# Patient Record
Sex: Male | Born: 1935
Health system: Southern US, Community
[De-identification: ages and names within clinical notes are randomized; demographics above are authoritative.]

## PROBLEM LIST (undated history)

## (undated) ENCOUNTER — Emergency Department (HOSPITAL_BASED_OUTPATIENT_CLINIC_OR_DEPARTMENT_OTHER): Admission: EM | Payer: Medicare Other | Source: Home / Self Care

## (undated) DIAGNOSIS — D72819 Decreased white blood cell count, unspecified: Secondary | ICD-10-CM

## (undated) DIAGNOSIS — N289 Disorder of kidney and ureter, unspecified: Secondary | ICD-10-CM

## (undated) DIAGNOSIS — J449 Chronic obstructive pulmonary disease, unspecified: Secondary | ICD-10-CM

## (undated) DIAGNOSIS — N184 Chronic kidney disease, stage 4 (severe): Secondary | ICD-10-CM

## (undated) DIAGNOSIS — D649 Anemia, unspecified: Secondary | ICD-10-CM

## (undated) DIAGNOSIS — I714 Abdominal aortic aneurysm, without rupture: Secondary | ICD-10-CM

## (undated) DIAGNOSIS — K274 Chronic or unspecified peptic ulcer, site unspecified, with hemorrhage: Secondary | ICD-10-CM

## (undated) DIAGNOSIS — M4800 Spinal stenosis, site unspecified: Secondary | ICD-10-CM

## (undated) DIAGNOSIS — IMO0002 Reserved for concepts with insufficient information to code with codable children: Secondary | ICD-10-CM

## (undated) DIAGNOSIS — I10 Essential (primary) hypertension: Secondary | ICD-10-CM

## (undated) DIAGNOSIS — F0390 Unspecified dementia without behavioral disturbance: Secondary | ICD-10-CM

## (undated) DIAGNOSIS — Q600 Renal agenesis, unilateral: Secondary | ICD-10-CM

## (undated) DIAGNOSIS — K254 Chronic or unspecified gastric ulcer with hemorrhage: Secondary | ICD-10-CM

## (undated) DIAGNOSIS — J189 Pneumonia, unspecified organism: Secondary | ICD-10-CM

## (undated) DIAGNOSIS — M1990 Unspecified osteoarthritis, unspecified site: Secondary | ICD-10-CM

## (undated) DIAGNOSIS — I4891 Unspecified atrial fibrillation: Secondary | ICD-10-CM

## (undated) DIAGNOSIS — Z8639 Personal history of other endocrine, nutritional and metabolic disease: Secondary | ICD-10-CM

## (undated) HISTORY — PX: CATARACT EXTRACTION W/ INTRAOCULAR LENS  IMPLANT, BILATERAL: SHX1307

## (undated) HISTORY — DX: Spinal stenosis, site unspecified: M48.00

## (undated) HISTORY — PX: MOHS SURGERY: SHX181

## (undated) HISTORY — DX: Chronic or unspecified peptic ulcer, site unspecified, with hemorrhage: K27.4

## (undated) HISTORY — DX: Essential (primary) hypertension: I10

## (undated) HISTORY — DX: Chronic obstructive pulmonary disease, unspecified: J44.9

## (undated) HISTORY — DX: Decreased white blood cell count, unspecified: D72.819

## (undated) HISTORY — DX: Disorder of kidney and ureter, unspecified: N28.9

## (undated) HISTORY — DX: Abdominal aortic aneurysm, without rupture: I71.4

## (undated) HISTORY — DX: Chronic kidney disease, stage 4 (severe): N18.4

## (undated) HISTORY — DX: Personal history of other endocrine, nutritional and metabolic disease: Z86.39

## (undated) HISTORY — DX: Anemia, unspecified: D64.9

## (undated) HISTORY — DX: Renal agenesis, unilateral: Q60.0

## (undated) HISTORY — DX: Reserved for concepts with insufficient information to code with codable children: IMO0002

---

## 1988-12-27 HISTORY — PX: HEMORRHOID SURGERY: SHX153

## 1995-12-28 DIAGNOSIS — K284 Chronic or unspecified gastrojejunal ulcer with hemorrhage: Secondary | ICD-10-CM

## 1995-12-28 DIAGNOSIS — K274 Chronic or unspecified peptic ulcer, site unspecified, with hemorrhage: Secondary | ICD-10-CM

## 1995-12-28 HISTORY — DX: Chronic or unspecified gastrojejunal ulcer with hemorrhage: K28.4

## 1995-12-28 HISTORY — DX: Chronic or unspecified peptic ulcer, site unspecified, with hemorrhage: K27.4

## 2006-12-14 ENCOUNTER — Ambulatory Visit: Payer: Self-pay | Admitting: Internal Medicine

## 2007-03-01 ENCOUNTER — Ambulatory Visit: Payer: Self-pay | Admitting: Internal Medicine

## 2007-03-01 LAB — CONVERTED CEMR LAB
Chloride: 109 meq/L (ref 96–112)
Eosinophils Absolute: 0.1 10*3/uL (ref 0.0–0.6)
Eosinophils Relative: 1.7 % (ref 0.0–5.0)
GFR calc non Af Amer: 77 mL/min
Glucose, Bld: 102 mg/dL — ABNORMAL HIGH (ref 70–99)
HCT: 41 % (ref 39.0–52.0)
Hemoglobin: 14.1 g/dL (ref 13.0–17.0)
Lymphocytes Relative: 26.2 % (ref 12.0–46.0)
MCV: 93.4 fL (ref 78.0–100.0)
Monocytes Absolute: 0.3 10*3/uL (ref 0.2–0.7)
Neutro Abs: 2.3 10*3/uL (ref 1.4–7.7)
Neutrophils Relative %: 62.1 % (ref 43.0–77.0)
RBC: 4.39 M/uL (ref 4.22–5.81)
Sodium: 143 meq/L (ref 135–145)
WBC: 3.6 10*3/uL — ABNORMAL LOW (ref 4.5–10.5)

## 2007-03-21 ENCOUNTER — Ambulatory Visit: Payer: Self-pay | Admitting: Gastroenterology

## 2007-04-12 ENCOUNTER — Ambulatory Visit: Payer: Self-pay | Admitting: Gastroenterology

## 2007-04-12 DIAGNOSIS — K5730 Diverticulosis of large intestine without perforation or abscess without bleeding: Secondary | ICD-10-CM | POA: Insufficient documentation

## 2007-05-10 ENCOUNTER — Ambulatory Visit: Payer: Self-pay | Admitting: Gastroenterology

## 2007-05-25 ENCOUNTER — Encounter: Payer: Self-pay | Admitting: Internal Medicine

## 2007-05-25 ENCOUNTER — Encounter: Payer: Self-pay | Admitting: Gastroenterology

## 2007-05-25 ENCOUNTER — Ambulatory Visit (HOSPITAL_COMMUNITY): Admission: RE | Admit: 2007-05-25 | Discharge: 2007-05-25 | Payer: Self-pay | Admitting: Gastroenterology

## 2007-05-30 ENCOUNTER — Ambulatory Visit: Payer: Self-pay | Admitting: Gastroenterology

## 2007-07-19 ENCOUNTER — Ambulatory Visit: Payer: Self-pay | Admitting: Gastroenterology

## 2007-08-17 ENCOUNTER — Ambulatory Visit: Payer: Self-pay | Admitting: Family Medicine

## 2007-08-17 ENCOUNTER — Telehealth (INDEPENDENT_AMBULATORY_CARE_PROVIDER_SITE_OTHER): Payer: Self-pay | Admitting: *Deleted

## 2007-09-04 ENCOUNTER — Ambulatory Visit (HOSPITAL_COMMUNITY): Admission: RE | Admit: 2007-09-04 | Discharge: 2007-09-04 | Payer: Self-pay | Admitting: Gastroenterology

## 2007-10-13 ENCOUNTER — Ambulatory Visit: Payer: Self-pay | Admitting: Gastroenterology

## 2007-10-13 ENCOUNTER — Ambulatory Visit: Payer: Self-pay | Admitting: Internal Medicine

## 2007-11-29 ENCOUNTER — Ambulatory Visit: Payer: Self-pay | Admitting: Internal Medicine

## 2007-11-29 DIAGNOSIS — L719 Rosacea, unspecified: Secondary | ICD-10-CM | POA: Insufficient documentation

## 2007-11-29 DIAGNOSIS — L989 Disorder of the skin and subcutaneous tissue, unspecified: Secondary | ICD-10-CM | POA: Insufficient documentation

## 2007-12-28 HISTORY — PX: ABDOMINAL AORTIC ANEURYSM REPAIR: SUR1152

## 2007-12-28 HISTORY — PX: NEPHRECTOMY: SHX65

## 2008-03-01 DIAGNOSIS — K219 Gastro-esophageal reflux disease without esophagitis: Secondary | ICD-10-CM

## 2008-03-01 DIAGNOSIS — Z85828 Personal history of other malignant neoplasm of skin: Secondary | ICD-10-CM | POA: Insufficient documentation

## 2008-03-01 DIAGNOSIS — Z8711 Personal history of peptic ulcer disease: Secondary | ICD-10-CM | POA: Insufficient documentation

## 2008-03-01 DIAGNOSIS — E7800 Pure hypercholesterolemia, unspecified: Secondary | ICD-10-CM

## 2008-09-20 ENCOUNTER — Emergency Department (HOSPITAL_BASED_OUTPATIENT_CLINIC_OR_DEPARTMENT_OTHER): Admission: EM | Admit: 2008-09-20 | Discharge: 2008-09-20 | Payer: Self-pay | Admitting: Emergency Medicine

## 2008-09-23 ENCOUNTER — Telehealth: Payer: Self-pay | Admitting: Internal Medicine

## 2008-09-24 ENCOUNTER — Encounter: Payer: Self-pay | Admitting: Internal Medicine

## 2008-09-25 ENCOUNTER — Encounter: Admission: RE | Admit: 2008-09-25 | Discharge: 2008-09-25 | Payer: Self-pay | Admitting: Surgery

## 2008-09-26 DIAGNOSIS — I714 Abdominal aortic aneurysm, without rupture, unspecified: Secondary | ICD-10-CM

## 2008-09-26 HISTORY — DX: Abdominal aortic aneurysm, without rupture, unspecified: I71.40

## 2008-09-26 HISTORY — DX: Abdominal aortic aneurysm, without rupture: I71.4

## 2008-09-30 ENCOUNTER — Ambulatory Visit: Payer: Self-pay | Admitting: Surgery

## 2008-10-04 ENCOUNTER — Inpatient Hospital Stay (HOSPITAL_COMMUNITY): Admission: RE | Admit: 2008-10-04 | Discharge: 2008-10-05 | Payer: Self-pay | Admitting: Surgery

## 2008-10-04 ENCOUNTER — Ambulatory Visit: Payer: Self-pay | Admitting: Surgery

## 2008-10-09 ENCOUNTER — Ambulatory Visit: Payer: Self-pay | Admitting: Internal Medicine

## 2008-10-09 ENCOUNTER — Ambulatory Visit: Payer: Self-pay | Admitting: Vascular Surgery

## 2008-10-14 ENCOUNTER — Encounter: Admission: RE | Admit: 2008-10-14 | Discharge: 2008-10-14 | Payer: Self-pay | Admitting: Surgery

## 2008-10-24 ENCOUNTER — Encounter (INDEPENDENT_AMBULATORY_CARE_PROVIDER_SITE_OTHER): Payer: Self-pay | Admitting: Urology

## 2008-10-24 ENCOUNTER — Inpatient Hospital Stay (HOSPITAL_COMMUNITY): Admission: RE | Admit: 2008-10-24 | Discharge: 2008-10-28 | Payer: Self-pay | Admitting: Urology

## 2008-12-23 ENCOUNTER — Ambulatory Visit: Payer: Self-pay | Admitting: Surgery

## 2008-12-25 ENCOUNTER — Ambulatory Visit: Payer: Self-pay | Admitting: Internal Medicine

## 2008-12-25 DIAGNOSIS — I10 Essential (primary) hypertension: Secondary | ICD-10-CM

## 2008-12-25 DIAGNOSIS — N189 Chronic kidney disease, unspecified: Secondary | ICD-10-CM

## 2008-12-25 DIAGNOSIS — C649 Malignant neoplasm of unspecified kidney, except renal pelvis: Secondary | ICD-10-CM | POA: Insufficient documentation

## 2008-12-25 DIAGNOSIS — I714 Abdominal aortic aneurysm, without rupture: Secondary | ICD-10-CM

## 2008-12-30 ENCOUNTER — Telehealth (INDEPENDENT_AMBULATORY_CARE_PROVIDER_SITE_OTHER): Payer: Self-pay | Admitting: *Deleted

## 2008-12-30 LAB — CONVERTED CEMR LAB
BUN: 22 mg/dL (ref 6–23)
Basophils Relative: 0.3 % (ref 0.0–3.0)
Creatinine, Ser: 1.9 mg/dL — ABNORMAL HIGH (ref 0.4–1.5)
Eosinophils Absolute: 0.2 10*3/uL (ref 0.0–0.7)
Eosinophils Relative: 5.3 % — ABNORMAL HIGH (ref 0.0–5.0)
GFR calc Af Amer: 44 mL/min
GFR calc non Af Amer: 37 mL/min
HCT: 34.5 % — ABNORMAL LOW (ref 39.0–52.0)
MCV: 93.8 fL (ref 78.0–100.0)
Monocytes Absolute: 0.4 10*3/uL (ref 0.1–1.0)
Platelets: 104 10*3/uL — ABNORMAL LOW (ref 150–400)
RBC: 3.68 M/uL — ABNORMAL LOW (ref 4.22–5.81)
WBC: 3.5 10*3/uL — ABNORMAL LOW (ref 4.5–10.5)

## 2008-12-31 ENCOUNTER — Encounter (INDEPENDENT_AMBULATORY_CARE_PROVIDER_SITE_OTHER): Payer: Self-pay | Admitting: *Deleted

## 2009-01-31 ENCOUNTER — Encounter: Payer: Self-pay | Admitting: Internal Medicine

## 2009-02-07 ENCOUNTER — Ambulatory Visit: Payer: Self-pay | Admitting: Internal Medicine

## 2009-02-10 ENCOUNTER — Encounter: Admission: RE | Admit: 2009-02-10 | Discharge: 2009-02-10 | Payer: Self-pay | Admitting: Surgery

## 2009-02-10 ENCOUNTER — Ambulatory Visit: Payer: Self-pay | Admitting: Surgery

## 2009-03-07 ENCOUNTER — Ambulatory Visit: Payer: Self-pay | Admitting: Family Medicine

## 2009-03-18 ENCOUNTER — Ambulatory Visit: Payer: Self-pay | Admitting: Family Medicine

## 2009-04-04 ENCOUNTER — Encounter: Admission: RE | Admit: 2009-04-04 | Discharge: 2009-04-04 | Payer: Self-pay | Admitting: Urology

## 2009-05-05 ENCOUNTER — Ambulatory Visit: Payer: Self-pay | Admitting: Surgery

## 2009-06-02 ENCOUNTER — Ambulatory Visit: Payer: Self-pay | Admitting: Family Medicine

## 2009-06-02 ENCOUNTER — Telehealth (INDEPENDENT_AMBULATORY_CARE_PROVIDER_SITE_OTHER): Payer: Self-pay | Admitting: *Deleted

## 2009-06-06 ENCOUNTER — Ambulatory Visit: Payer: Self-pay | Admitting: Internal Medicine

## 2009-06-06 DIAGNOSIS — J449 Chronic obstructive pulmonary disease, unspecified: Secondary | ICD-10-CM | POA: Insufficient documentation

## 2009-06-06 DIAGNOSIS — D649 Anemia, unspecified: Secondary | ICD-10-CM | POA: Insufficient documentation

## 2009-06-09 ENCOUNTER — Ambulatory Visit: Payer: Self-pay | Admitting: Internal Medicine

## 2009-06-12 ENCOUNTER — Telehealth (INDEPENDENT_AMBULATORY_CARE_PROVIDER_SITE_OTHER): Payer: Self-pay | Admitting: *Deleted

## 2009-06-12 LAB — CONVERTED CEMR LAB
BUN: 39 mg/dL — ABNORMAL HIGH (ref 6–23)
Basophils Absolute: 0 10*3/uL (ref 0.0–0.1)
Chloride: 111 meq/L (ref 96–112)
Cholesterol: 191 mg/dL (ref 0–200)
Creatinine, Ser: 2.3 mg/dL — ABNORMAL HIGH (ref 0.4–1.5)
Eosinophils Absolute: 0 10*3/uL (ref 0.0–0.7)
Eosinophils Relative: 0.8 % (ref 0.0–5.0)
Ferritin: 90.2 ng/mL (ref 22.0–322.0)
GFR calc non Af Amer: 29.21 mL/min (ref >60)
Glucose, Bld: 93 mg/dL (ref 70–99)
HCT: 36.3 % — ABNORMAL LOW (ref 39.0–52.0)
Iron: 66 ug/dL (ref 42–165)
LDL Cholesterol: 130 mg/dL — ABNORMAL HIGH (ref 0–99)
Lymphs Abs: 1.1 10*3/uL (ref 0.7–4.0)
MCV: 91.6 fL (ref 78.0–100.0)
Monocytes Absolute: 0.4 10*3/uL (ref 0.1–1.0)
Neutrophils Relative %: 62.9 % (ref 43.0–77.0)
Platelets: 127 10*3/uL — ABNORMAL LOW (ref 150.0–400.0)
Potassium: 4.7 meq/L (ref 3.5–5.1)
RDW: 12.8 % (ref 11.5–14.6)
Triglycerides: 83 mg/dL (ref 0.0–149.0)
VLDL: 16.6 mg/dL (ref 0.0–40.0)
WBC: 4.3 10*3/uL — ABNORMAL LOW (ref 4.5–10.5)

## 2009-07-08 ENCOUNTER — Encounter: Payer: Self-pay | Admitting: Internal Medicine

## 2009-07-10 ENCOUNTER — Telehealth (INDEPENDENT_AMBULATORY_CARE_PROVIDER_SITE_OTHER): Payer: Self-pay | Admitting: *Deleted

## 2009-07-18 ENCOUNTER — Ambulatory Visit (HOSPITAL_COMMUNITY): Admission: RE | Admit: 2009-07-18 | Discharge: 2009-07-18 | Payer: Self-pay | Admitting: Internal Medicine

## 2009-07-22 ENCOUNTER — Encounter: Payer: Self-pay | Admitting: Internal Medicine

## 2009-08-08 ENCOUNTER — Encounter: Payer: Self-pay | Admitting: Internal Medicine

## 2009-10-07 ENCOUNTER — Ambulatory Visit: Payer: Self-pay | Admitting: Internal Medicine

## 2009-10-16 ENCOUNTER — Encounter: Admission: RE | Admit: 2009-10-16 | Discharge: 2009-10-16 | Payer: Self-pay | Admitting: Urology

## 2009-11-03 ENCOUNTER — Ambulatory Visit: Payer: Self-pay | Admitting: Surgery

## 2010-01-23 ENCOUNTER — Ambulatory Visit (HOSPITAL_COMMUNITY): Admission: RE | Admit: 2010-01-23 | Discharge: 2010-01-23 | Payer: Self-pay | Admitting: Internal Medicine

## 2010-01-27 ENCOUNTER — Encounter: Payer: Self-pay | Admitting: Internal Medicine

## 2010-01-27 DIAGNOSIS — D72819 Decreased white blood cell count, unspecified: Secondary | ICD-10-CM

## 2010-01-27 HISTORY — DX: Decreased white blood cell count, unspecified: D72.819

## 2010-02-20 ENCOUNTER — Encounter: Payer: Self-pay | Admitting: Internal Medicine

## 2010-02-23 ENCOUNTER — Encounter: Payer: Self-pay | Admitting: Internal Medicine

## 2010-02-23 LAB — CONVERTED CEMR LAB: Platelets: 111 10*3/uL

## 2010-03-23 ENCOUNTER — Encounter: Payer: Self-pay | Admitting: Internal Medicine

## 2010-04-07 ENCOUNTER — Ambulatory Visit: Payer: Self-pay | Admitting: Internal Medicine

## 2010-04-09 ENCOUNTER — Encounter: Admission: RE | Admit: 2010-04-09 | Discharge: 2010-04-09 | Payer: Self-pay | Admitting: Internal Medicine

## 2010-04-09 ENCOUNTER — Encounter: Payer: Self-pay | Admitting: Internal Medicine

## 2010-04-09 ENCOUNTER — Encounter: Admission: RE | Admit: 2010-04-09 | Discharge: 2010-04-09 | Payer: Self-pay | Admitting: Urology

## 2010-04-10 LAB — CONVERTED CEMR LAB
Cholesterol: 223 mg/dL — ABNORMAL HIGH (ref 0–200)
Direct LDL: 158.3 mg/dL
HDL: 45 mg/dL (ref >39.00)
TSH: 2.61 microintl units/mL (ref 0.35–5.50)
Triglycerides: 125 mg/dL (ref 0.0–149.0)
VLDL: 25 mg/dL (ref 0.0–40.0)

## 2010-06-01 ENCOUNTER — Ambulatory Visit: Payer: Self-pay | Admitting: Surgery

## 2010-06-22 ENCOUNTER — Encounter: Payer: Self-pay | Admitting: Internal Medicine

## 2010-07-20 ENCOUNTER — Encounter: Payer: Self-pay | Admitting: Internal Medicine

## 2010-08-04 ENCOUNTER — Ambulatory Visit (HOSPITAL_COMMUNITY)
Admission: RE | Admit: 2010-08-04 | Discharge: 2010-08-04 | Payer: Self-pay | Source: Home / Self Care | Admitting: Internal Medicine

## 2010-10-01 ENCOUNTER — Ambulatory Visit: Payer: Self-pay | Admitting: Internal Medicine

## 2010-10-07 ENCOUNTER — Encounter: Admission: RE | Admit: 2010-10-07 | Discharge: 2010-10-07 | Payer: Self-pay | Admitting: Urology

## 2010-10-14 ENCOUNTER — Ambulatory Visit: Payer: Self-pay | Admitting: Internal Medicine

## 2010-10-19 ENCOUNTER — Encounter: Payer: Self-pay | Admitting: Internal Medicine

## 2010-12-07 ENCOUNTER — Ambulatory Visit: Payer: Self-pay | Admitting: Surgery

## 2010-12-07 ENCOUNTER — Encounter
Admission: RE | Admit: 2010-12-07 | Discharge: 2010-12-07 | Payer: Self-pay | Source: Home / Self Care | Attending: Surgery | Admitting: Surgery

## 2010-12-07 ENCOUNTER — Encounter: Payer: Self-pay | Admitting: Internal Medicine

## 2010-12-14 ENCOUNTER — Ambulatory Visit: Payer: Self-pay | Admitting: Internal Medicine

## 2010-12-14 DIAGNOSIS — M1990 Unspecified osteoarthritis, unspecified site: Secondary | ICD-10-CM

## 2011-01-17 ENCOUNTER — Encounter: Payer: Self-pay | Admitting: Surgery

## 2011-01-26 NOTE — Letter (Signed)
Summary: [facility]  [facility]   Imported By: Lanelle Bal 03/05/2010 12:32:52  _____________________________________________________________________  External Attachment:    Type:   Image     Comment:   External Document

## 2011-01-26 NOTE — Consult Note (Signed)
Summary: no change for now---- Nephrology  [facility]   Imported By: Lanelle Bal 07/31/2010 10:20:47  _____________________________________________________________________  External Attachment:    Type:   Image     Comment:   External Document

## 2011-01-26 NOTE — Assessment & Plan Note (Signed)
Summary: FLU SHOT//PH   Nurse Visit  CC: Flu shot./kb   Allergies: No Known Drug Allergies  Orders Added: 1)  Flu Vaccine 59yrs + MEDICARE PATIENTS [Q2039] 2)  Administration Flu vaccine - MCR [G0008]              Flu Vaccine Consent Questions     Do you have a history of severe allergic reactions to this vaccine? no    Any prior history of allergic reactions to egg and/or gelatin? no    Do you have a sensitivity to the preservative Thimersol? no    Do you have a past history of Guillan-Barre Syndrome? no    Do you currently have an acute febrile illness? no    Have you ever had a severe reaction to latex? no    Vaccine information given and explained to patient? yes    Are you currently pregnant? no    Lot Number:[id number]   Exp Date:06/26/2011   Site Given  Right Deltoid IMu

## 2011-01-26 NOTE — Letter (Signed)
Summary: [facility] Oncology  [facility]ematology Oncology   Imported By: Lanelle Bal 07/01/2010 15:25:19  _____________________________________________________________________  External Attachment:    Type:   Image     Comment:   External Document

## 2011-01-26 NOTE — Consult Note (Signed)
Summary: [facility]  [facility]   Imported By: Lanelle Bal 04/15/2010 13:39:32  _____________________________________________________________________  External Attachment:    Type:   Image     Comment:   External Document

## 2011-01-26 NOTE — Assessment & Plan Note (Signed)
Summary: 6 MTH FU/KDC   Vital Signs:  Patient profile:   77 year old male Height:      69.5 inches Weight:      180.38 pounds Pulse rate:   83 / minute Pulse rhythm:   regular BP sitting:   126 / 84  (left arm) Cuff size:   large  Vitals Entered By: Army Fossa CMA (October 14, 2010 7:59 AM) CC: 6 month f/u- fasting  Comments due [facility]oscopy [facility] [address]   History of Present Illness: ROV doing well , here w/  Raynelle Fanning needs refills Has seen his other doctors as usual.    Preventive Screening-Counseling & Management  Caffeine-Diet-Exercise     Does Patient Exercise: yes     Type of exercise: walking 2 miles      Times/week: 7  Current Medications (verified): 1)  Mvi W/ Iron 2)  Felodipine 2.5 Mg Xr24h-Tab (Felodipine) .Marland Kitchen.. 1 By Mouth Once Daily 3)  Stool Softner 4)  Omeprazole 20 Mg Cpdr (Omeprazole) .... One By Mouth Daily 5)  Vitamin D 1.25mg  .... 1 By Mouth Every Month (Rx'd By Nephro) 6)  Torsemide 10 Mg Tabs (Torsemide) .Marland Kitchen.. 1 By Mouth Every Other Day (Rx'd By Nephro)  Allergies (verified): No Known Drug Allergies  Past History:  Past Medical History: Reviewed history from 04/07/2010 and no changes required. Bleeding Ulcer-1997 Abdominal aortic aneurysm Dx 10-09---> s/p endovascular repair and angiogram 10-04-08 carotid u/s <30% B (10-09) RENAL CELL CARCINOMA, CLEAR CELL TYPE   Dx 10-09, s/p excision 10-24-08 Hypertension Renal insufficiency COPD? history of leukopenia/osteopenia thought to be from doxycycline (2-11)  Past Surgical History: Reviewed history from 03/01/2008 and no changes required. 1997- Lip Cancer Removed 2004-Skin Cancer Removed Hemorrhoidectomy (4540'J)  Social History: Reviewed history from 04/07/2010 and no changes required. Married lives w/ wife totally independent on ADL still drives  treadmill 2 miles /day often came to the office w/ his daughter Alwyn Ren  [phone]) quit tobacco 2009 Alcohol use-no Does Patient  Exercise:  yes  Review of Systems       He continued to live independently and able to do all his ADLs Hypertension-- ambulatory BPs 120/80 Renal insufficiency this is stable per last nephrology visit PSA was a slightly elevated, he saw urology, no action needed Wonders if he is due for a colonoscopy, chart reviewed, next colonoscopy 2018 still very active and walks in the treadmill   Physical Exam  General:  alert and well-developed.   Lungs:  normal respiratory effort, no intercostal retractions, and no accessory muscle use.  slightly decreased breath sounds Heart:  normal rate, regular rhythm, no murmur, and no gallop.   Extremities:  no edema Psych:  normally interactive, good eye contact, not anxious appearing, and not depressed appearing.     Impression & Recommendations:  Problem # 1:  RENAL INSUFFICIENCY (ICD-588.9) stable  Problem # 2:  HYPERTENSION (ICD-401.9) medication refilled His updated medication list for this problem includes:    Felodipine 2.5 Mg Xr24h-tab (Felodipine) .Marland Kitchen... 1 by mouth once daily    Torsemide 10 Mg Tabs (Torsemide) .Marland Kitchen... 1 by mouth every other day (rx'd by nephro)  BP today: 126/84 Prior BP: 128/64 (04/07/2010)  Labs Reviewed: K+: 4.7 (06/09/2009) Creat: : 2.3 (06/09/2009)   Chol: 223 (04/07/2010)   HDL: 45.00 (04/07/2010)   LDL: 130 (06/09/2009)   TG: 125.0 (04/07/2010)  Problem # 3:  GERD (ICD-530.81) medication refilled His updated medication list for this problem includes:    Omeprazole 20  Mg Cpdr (Omeprazole) ..... One by mouth daily    Complete Medication List: 1)  Mvi W/ Iron  2)  Felodipine 2.5 Mg Xr24h-tab (Felodipine) .Marland Kitchen.. 1 by mouth once daily 3)  Stool Softner  4)  Omeprazole 20 Mg Cpdr (Omeprazole) .... One by mouth daily 5)  Vitamin D 1.25mg   .... 1 by mouth every month (rx'd by nephro) 6)  Torsemide 10 Mg Tabs (Torsemide) .Marland Kitchen.. 1 by mouth every other day (rx'd by nephro)  Patient Instructions: 1)  Please schedule a  follow-up appointment in 6 months , fasting, yearly  Prescriptions: OMEPRAZOLE 20 MG CPDR (OMEPRAZOLE) one by mouth daily  #30 Capsule x 12   Entered and Authorized by:   Nolon Rod. Laneta Guerin MD   Signed by:   Nolon Rod. Avant Printy MD on 10/14/2010   Method used:   [facility] to        [facility] [phone]* (retail)       [address]       [place], [place]  [address]       Ph: [phone]       Fax: [phone]   RxID:   [id number] FELODIPINE 2.5 MG XR24H-TAB (FELODIPINE) 1 by mouth once daily  #30 x 12   Entered and Authorized by:   Nolon Rod. Brezlyn Manrique MD   Signed by:   Nolon Rod. Baneen Wieseler MD on 10/14/2010   Method used:   [facility] to        [facility] [phone]* (retail)       [address]       [place], [place]  [address]       Ph: [phone]       Fax: [phone]   RxID:   [id number]    Orders Added: 1)  Est. Patient Level III [20254]     Risk Factors:  Exercise:  yes    Times per week:  7    Type:  walking 2 miles

## 2011-01-26 NOTE — Procedures (Signed)
Summary: [facility] -- anemia of chronic dz   [facility]ematology Oncology   Imported By: Lanelle Bal 03/27/2010 10:21:33  _____________________________________________________________________  External Attachment:    Type:   Image     Comment:   External Document

## 2011-01-26 NOTE — Letter (Signed)
Summary: [facility]  [facility]   Imported By: Lanelle Bal 04/01/2010 12:24:01  _____________________________________________________________________  External Attachment:    Type:   Image     Comment:   External Document

## 2011-01-26 NOTE — Consult Note (Signed)
Summary: C[facility] stable------------[facility]  [facility]   Imported By: Lanelle Bal 10/29/2010 15:56:43  _____________________________________________________________________  External Attachment:    Type:   Image     Comment:   External Document

## 2011-01-26 NOTE — Letter (Signed)
Summary: [facility] Hematology Oncology  [facility] Hematology Oncology   Imported By: Lanelle Bal 03/02/2010 10:01:28  _____________________________________________________________________  External Attachment:    Type:   Image     Comment:   External Document

## 2011-01-26 NOTE — Letter (Signed)
Summary:   Nephrology  [facility]   Imported By: Lanelle Bal 02/12/2010 09:29:43  _____________________________________________________________________  External Attachment:    Type:   Image     Comment:   External Document

## 2011-01-26 NOTE — Assessment & Plan Note (Signed)
Summary: GENERAL CHECKUP/NS/KDC   Vital Signs:  Patient profile:   77 year old male Height:      69.5 inches Weight:      187.4 pounds BMI:     27.38 Pulse rate:   76 / minute BP sitting:   128 / 64  Vitals Entered By: Shary Decamp (April 07, 2010 10:29 AM) CC: yearly - fasting, no concerns   History of Present Illness: here with her daughter and wife  Abdominal aortic aneurysm Dx 10-09---> follow-up by   surgery  RENAL CELL CARCINOMA--note from urology 01/1999 and then reviewed, stable, follow-up in 6 months  Hypertension-- ambulatory BPs 120s  Renal insufficiency--the note from nephrology reviewed, stable; patient would like a referral to a nutritionist  COPD? occasionally wheezing   yearly checkup, chart reviewed 05/2007 EGD showed achalasia 05/2007 colonoscopy normal, Next in  10 years  Current Medications (verified): 1)  Mvi W/ Iron 2)  Felodipine 2.5 Mg Xr24h-Tab (Felodipine) .Marland Kitchen.. 1 By Mouth Once Daily 3)  Stool Softner 4)  Omeprazole 20 Mg Cpdr (Omeprazole) .... One By Mouth Daily 5)  Vitamin D 1.25mg  .... 1 By Mouth Every Month (Rx'd By Nephro) 6)  Torsemide 10 Mg Tabs (Torsemide) .Marland Kitchen.. 1 By Mouth Every Other Day (Rx'd By Nephro)  Allergies (verified): No Known Drug Allergies  Past History:  Past Medical History: Bleeding Ulcer-1997 Abdominal aortic aneurysm Dx 10-09---> s/p endovascular repair and angiogram 10-04-08 carotid u/s <30% B (10-09) RENAL CELL CARCINOMA, CLEAR CELL TYPE   Dx 10-09, s/p excision 10-24-08 Hypertension Renal insufficiency COPD? history of leukopenia/osteopenia thought to be from doxycycline (2-11)  Past Surgical History: Reviewed history from 03/01/2008 and no changes required. 1997- Lip Cancer Removed 2004-Skin Cancer Removed Hemorrhoidectomy (4401'U)  Family History: DM-- F? Stroke-- + HTN-- + colon ca-- no prostate ca--no  Social History: Married lives w/ wife totally independent on ADL still drives  treadmill 2  miles /day often came to the office w/ his daughter Alwyn Ren  [phone]) quit tobacco 2009 Alcohol use-no  Review of Systems CV:  Denies chest pain or discomfort and swelling of feet. Resp:  Denies cough and shortness of breath. GI:  Denies bloody stools, nausea, and vomiting.  Physical Exam  General:  alert and well-developed.   Lungs:  normal respiratory effort, no intercostal retractions, and no accessory muscle use.  slightly decreased breath sounds Heart:  normal rate, regular rhythm, no murmur, and no gallop.   Abdomen:  soft, non-tender, no distention, and no masses.   Rectal:  No external abnormalities noted. Normal sphincter tone. No rectal masses or tenderness. Prostate:  Prostate gland firm and smooth, no enlargement, nodularity, tenderness, mass, asymmetry or induration. Extremities:  no edema Psych:  not anxious appearing and not depressed appearing.     Impression & Recommendations:  Problem # 1:  AAA (ICD-441.4) followed by surgery  Orders: TLB-Lipid Panel (80061-LIPID)  Problem # 2:  ROUTINE GENERAL MEDICAL EXAM@HEALTH  CARE FACL (ICD-V70.0) Td  done today pneumonia shot  2007 printed material provided regards shingles shot  most labs done elsewhere check a PSA  colonoscopy 05/2007 negative except for tics, next in 10 years  Problem # 3:  RENAL CELL CANCER (ICD-189.0) follow-up by urology  Problem # 4:  RENAL INSUFFICIENCY (ICD-588.9)  follow-up by nephrology patient likes to talk to a nutritionist ----> referal  Orders: Nutrition Referral (Nutrition)  Problem # 5:  HYPERTENSION (ICD-401.9) well-controlled His updated medication list for this problem includes:    Felodipine 2.5  Mg Xr24h-tab (Felodipine) .Marland Kitchen... 1 by mouth once daily    Torsemide 10 Mg Tabs (Torsemide) .Marland Kitchen... 1 by mouth every other day (rx'd by nephro)    BP today: 128/64 Prior BP: 190/92 (10/07/2009)  Labs Reviewed: K+: 4.7 (06/09/2009) Creat: : 2.3 (06/09/2009)   Chol: 191  (06/09/2009)   HDL: 44.30 (06/09/2009)   LDL: 130 (06/09/2009)   TG: 83.0 (06/09/2009)  Orders: Venipuncture (95093) TLB-TSH (Thyroid Stimulating Hormone) (84443-TSH)  Problem # 6:  HYPERCHOLESTEROLEMIA (ICD-272.0) labs   HDL:44.30 (06/09/2009), 45.6 (03/01/2007)  LDL:130 (06/09/2009), DEL (03/01/2007)  Chol:191 (06/09/2009), 216 (03/01/2007)  Trig:83.0 (06/09/2009), 84 (03/01/2007)  Complete Medication List: 1)  Mvi W/ Iron  2)  Felodipine 2.5 Mg Xr24h-tab (Felodipine) .Marland Kitchen.. 1 by mouth once daily 3)  Stool Softner  4)  Omeprazole 20 Mg Cpdr (Omeprazole) .... One by mouth daily 5)  Vitamin D 1.25mg   .... 1 by mouth every month (rx'd by nephro) 6)  Torsemide 10 Mg Tabs (Torsemide) .Marland Kitchen.. 1 by mouth every other day (rx'd by nephro)  Other Orders: TLB-PSA (Prostate Specific Antigen) (84153-PSA) TD Toxoids IM 7 YR + (26712) Admin 1st Vaccine (45809) Admin 1st Vaccine [facility]) [phone])  Patient Instructions: 1)  Please schedule a follow-up appointment in 6 months .    Pneumovax Immunization History:    Pneumovax # 1:  Pneumovax (12/14/2006)  Tetanus/Td Vaccine    Vaccine Type: Td    Site: left deltoid    Mfr: Sanofi Pasteur    Dose: 0.5 ml    Route: IM    Given by: Shary Decamp    Exp. Date: 04/01/2011    Lot #: [id number]

## 2011-01-28 NOTE — Letter (Signed)
Summary: [facility] Hematology Oncology  [facility] Hematology Oncology   Imported By: Lanelle Bal 12/17/2010 11:23:43  _____________________________________________________________________  External Attachment:    Type:   Image     Comment:   External Document

## 2011-01-28 NOTE — Assessment & Plan Note (Signed)
Summary: FOR HIP PAIN//PH   Vital Signs:  Patient profile:   77 year old male Weight:      183.50 pounds Pulse rate:   71 / minute Pulse rhythm:   regular BP sitting:   132 / 82  (left arm) Cuff size:   large  Vitals Entered By: Army Fossa CMA (December 14, 2010 1:21 PM) CC: Pt here c/o "catch" in (L) hip. Comments x 1 week. [facility] [place]    History of Present Illness: one-week history of pain in the posterior aspect of the left  hip No radiation Has not been taking any medication for that pain is worse with walking, no pain at rest. Some limping  review of systems Denies any recent fall or increase in physical activity, in fact he has been walking 2 miles almost daily for a while but he discontinued this activity for the last few days. no rash in the buttocks No abdominal pain nephrology change from torsemide to furosemide.   Current Medications (verified): 1)  Mvi W/ Iron 2)  Felodipine 2.5 Mg Xr24h-Tab (Felodipine) .Marland Kitchen.. 1 By Mouth Once Daily 3)  Stool Softner 4)  Omeprazole 20 Mg Cpdr (Omeprazole) .... One By Mouth Daily 5)  Vitamin D 1.25mg  .... 1 By Mouth Every Month (Rx'd By Nephro) 6)  Furosemide 20 Mg Tabs (Furosemide) .Marland Kitchen.. 1 By Mouth Daily  Allergies (verified): No Known Drug Allergies  Past History:  Past Medical History: Reviewed history from 04/07/2010 and no changes required. Bleeding Ulcer-1997 Abdominal aortic aneurysm Dx 10-09---> s/p endovascular repair and angiogram 10-04-08 carotid u/s <30% B (10-09) RENAL CELL CARCINOMA, CLEAR CELL TYPE   Dx 10-09, s/p excision 10-24-08 Hypertension Renal insufficiency COPD? history of leukopenia/osteopenia thought to be from doxycycline (2-11)  Past Surgical History: Reviewed history from 03/01/2008 and no changes required. 1997- Lip Cancer Removed 2004-Skin Cancer Removed Hemorrhoidectomy (4098'J)  Social History: Reviewed history from 04/07/2010 and no changes required. Married lives w/  wife totally independent on ADL still drives  treadmill 2 miles /day often came to the office w/ his daughter Alwyn Ren  [phone]) quit tobacco 2009 Alcohol use-no  Physical Exam  General:  alert, well-developed, and well-nourished.   Abdomen:  soft, non-tender, no distention, and no masses.   Extremities:  normal with surgically Rotation of the right hip  normal Rotation of the left hip provoke the pain. Normal palpation of the trochanteric bursa bilaterally. Neurologic:  Lower extremity strength is normal, walks with some difficulty due to pain Psych:  not anxious appearing and not depressed appearing.     Impression & Recommendations:  Problem # 1:  DEGENERATIVE JOINT DISEASE (ICD-715.90) left hip pain likely from DJD. No history of recent trauma. The patient reports today that the pain is already some better. We talk about medication --reminded him to avoid NSAIDs  due to his chronic renal insufficiency. --He will try Tylenol, see instructions, this was discussed with the daughter. -- declined vicodin as the pain is not severe --will call if symptoms severe or persistent  Complete Medication List: 1)  Mvi W/ Iron  2)  Felodipine 2.5 Mg Xr24h-tab (Felodipine) .Marland Kitchen.. 1 by mouth once daily 3)  Stool Softner  4)  Omeprazole 20 Mg Cpdr (Omeprazole) .... One by mouth daily 5)  Vitamin D 1.25mg   .... 1 by mouth every month (rx'd by nephro) 6)  Furosemide 20 Mg Tabs (Furosemide) .Marland Kitchen.. 1 by mouth daily  Patient Instructions: 1)  Take 500 mg   tylenol  2  TABLETS every  6 hours as needed for relief of pain . Avoid taking more than 4000 mg in a 24 hour period( can cause liver damage in higher doses).    Orders Added: 1)  Est. Patient Level III [11914]

## 2011-02-15 ENCOUNTER — Encounter: Payer: Self-pay | Admitting: Internal Medicine

## 2011-02-23 ENCOUNTER — Encounter: Payer: Self-pay | Admitting: Internal Medicine

## 2011-03-03 ENCOUNTER — Encounter: Payer: Self-pay | Admitting: Internal Medicine

## 2011-03-04 NOTE — Letter (Signed)
Summary: [facility]  [facility]   Imported By: Lanelle Bal 02/24/2011 09:08:46  _____________________________________________________________________  External Attachment:    Type:   Image     Comment:   External Document

## 2011-03-08 ENCOUNTER — Encounter: Payer: Self-pay | Admitting: Internal Medicine

## 2011-03-09 NOTE — Letter (Signed)
Summary: renal CA and increased PSA f/u---- [facility]  [facility]   Imported By: Maryln Gottron 03/02/2011 14:45:11  _____________________________________________________________________  External Attachment:    Type:   Image     Comment:   External Document

## 2011-03-25 NOTE — Letter (Signed)
Summary: [facility] Hematology Oncology  [facility]ematology Oncology   Imported By: Maryln Gottron 03/15/2011 14:32:25  _____________________________________________________________________  External Attachment:    Type:   Image     Comment:   External Document

## 2011-03-29 ENCOUNTER — Other Ambulatory Visit: Payer: Self-pay | Admitting: Urology

## 2011-03-29 ENCOUNTER — Ambulatory Visit
Admission: RE | Admit: 2011-03-29 | Discharge: 2011-03-29 | Disposition: A | Discharge status: Home / Self Care | Payer: Medicare Other | Source: Ambulatory Visit | Attending: Urology | Admitting: Urology

## 2011-03-29 DIAGNOSIS — Z85528 Personal history of other malignant neoplasm of kidney: Secondary | ICD-10-CM

## 2011-04-17 ENCOUNTER — Encounter: Payer: Self-pay | Admitting: Internal Medicine

## 2011-04-19 ENCOUNTER — Ambulatory Visit (INDEPENDENT_AMBULATORY_CARE_PROVIDER_SITE_OTHER): Payer: Medicare Other | Admitting: Internal Medicine

## 2011-04-19 ENCOUNTER — Encounter: Payer: Self-pay | Admitting: Internal Medicine

## 2011-04-19 VITALS — BP 126/80 | HR 78 | Wt 178.4 lb

## 2011-04-19 DIAGNOSIS — K219 Gastro-esophageal reflux disease without esophagitis: Secondary | ICD-10-CM

## 2011-04-19 DIAGNOSIS — E7800 Pure hypercholesterolemia, unspecified: Secondary | ICD-10-CM

## 2011-04-19 DIAGNOSIS — I714 Abdominal aortic aneurysm, without rupture: Secondary | ICD-10-CM

## 2011-04-19 DIAGNOSIS — E785 Hyperlipidemia, unspecified: Secondary | ICD-10-CM

## 2011-04-19 DIAGNOSIS — I10 Essential (primary) hypertension: Secondary | ICD-10-CM

## 2011-04-19 LAB — LIPID PANEL
Cholesterol: 200 mg/dL (ref 0–200)
HDL: 46.8 mg/dL (ref >39.00)
LDL Cholesterol: 137 mg/dL — ABNORMAL HIGH (ref 0–99)
VLDL: 16 mg/dL (ref 0.0–40.0)

## 2011-04-19 MED ORDER — OMEPRAZOLE 20 MG PO CPDR: 20.0000 mg | DELAYED_RELEASE_CAPSULE | Freq: Every day | ORAL | Status: DC

## 2011-04-19 NOTE — Assessment & Plan Note (Signed)
Closely follow up by cardiovascular surgery

## 2011-04-19 NOTE — Assessment & Plan Note (Signed)
No change 

## 2011-04-19 NOTE — Assessment & Plan Note (Signed)
Needs a refill of his medication. done

## 2011-04-19 NOTE — Assessment & Plan Note (Addendum)
Last LDL 154. Recheck his cholesterol. If not at goal, consider treatment vs. Observe (even if LDL not at goal , <130) b/c he is doing very well, asymptomatic. Prescribing more medication may cause side effects and confusion. Daughter  agrees.

## 2011-04-19 NOTE — Progress Notes (Signed)
  Subjective:    Patient ID: [name], male    DOB: Mar 29, 1927, 77 y.o.   MRN: [id number]  HPI  Here with his daughter, doing very well. The daughter updated me on all his visits to specialists he had since he last saw me. He has seen hematology, urology. He also saw nephrology, creatinine slightly better, potassium slightly higher than usual. Was not recommend anything in particular.  Past Medical History  Diagnosis Date  . Abdominal aortic aneurysm 09/2008    s/o endovascular repair and angiogram 10/04/08, carotid u/s <30% B (10/09)  . Bleeding ulcer 1997  . Leukopenia 2/11    thought to be from doxy  . Osteopenia 2/11    thought to be from doxy  . Renal cell carcinoma     clear cell type dx 10/09, s/p excision 10-24-08  . Hypertension   . Renal insufficiency   . COPD (chronic obstructive pulmonary disease)        Review of Systems Doing well, no chest pain, no shortness of breath. No cough. No lower extremity edema. Still active, walks the treadmill 2 miles daily. Still  lives independently with his wife. He does some limited driving    Objective:   Physical Exam Alert, no apparent distress, alternative, please send. Lungs : CTA B Cardiovascular regular rate and rhythm. No murmur Legs without edema       Assessment & Plan:

## 2011-04-21 ENCOUNTER — Telehealth: Payer: Self-pay | Admitting: *Deleted

## 2011-04-21 NOTE — Telephone Encounter (Signed)
Left detailed message for pts daughter letting her know of the lab results, due to her being out of town on Business.

## 2011-04-21 NOTE — Telephone Encounter (Signed)
Message copied by Army Fossa on Wed Apr 21, 2011  9:24 AM ------      Message from: Willow Ora      Created: Tue Apr 20, 2011  9:09 PM       Advise patient's daughter:      Cholesterol satisfactory!

## 2011-05-11 NOTE — Assessment & Plan Note (Signed)
OFFICE VISIT   [name], [name]  DOB:  28-Aug-1927                                       06/01/2010  EAVWU#:[id number]   REASON FOR VISIT:  Follow-up aneurysm.   HISTORY:  This is an 77 year old gentleman who is status post  endovascular aneurysm repair on October 04, 2008.  Subsequently he has  undergone a right nephrectomy secondary to a right renal cell tumor by  Dr. Isabel Caprice.  He is now down to approximately 20% renal function in his  remaining kidney according to the patient.  Overall however, he feels  very healthy.  He is not having any abdominal pain.  He denies  claudication.   PHYSICAL EXAMINATION:  His heart rate is 63, blood pressure 164/83, O2  saturations are 98% on room air.  General:  He is well-appearing in no  distress.  HEENT:  Within normal limits.  Respirations are nonlabored.  Abdomen:  Soft, nontender.  No pulsatile mass.  Musculoskeletal:  He  does not have any edema or cyanosis.  Neurologically:  He is grossly  intact.  He has palpable pedal pulses.   DIAGNOSTIC STUDIES:  I have independently reviewed his CT scan which was  a noncontrast CT performed in January at [facility].  When  comparing this scan to prior, there may be a slight increase in the size  of his aneurysm.  However this may be negligible.  Again it is without  contrast so I cannot tell if there is a leak.   ASSESSMENT/PLAN:  Status post endovascular aneurysm repair.   In comparing the patient's previous 2  CT scans, there may be a slight  increase in the size of his aneurysm.  However, without contrast I  cannot assess for endoleak.  Regardless, I think the increase may be  negligible.  Certainly with the patient's renal function,  any attempt  to evaluate for endoleak would put his kidneys at high risk.  For that  reason we have discussed obtaining another CT scan in 6 months to see if  there has been any change and we will base any recommendations based  on  the 71-month CT scan which will be without contrast.  I did discuss with  the patient and his family that if the aneurysm is getting bigger and if  there is endoleak both of which we cannot definitively evaluate today,  that he would be at slightly increased risk for rupture.  He understands  that.  I will plan on seeing back in 6 months.     [name], MD  Electronically Signed   VWB/MEDQ  D:  06/01/2010  T:  06/02/2010  Job:  [id number]   cc:   Valetta Fuller, M.D.
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
## 2011-05-11 NOTE — Assessment & Plan Note (Signed)
[facility]                         GASTROENTEROLOGY OFFICE NOTE   NAME:[name], [name]                         MRN:          [id number]  DATE:07/19/2007                            DOB:          1927/09/10    GI PROBLEM LIST:  1. Recent heme-positive stool, not anemic.  Colonoscopy in April 2008,      by me was normal except for diverticulosis.  No need for routine      colorectal cancer screening for 10 years.  Colonoscopy at that      point.  2. Chronic intermittent solid food dysphagia for at least 20 years.      EGD by me in May 2008, showed a snug GE junction but this was      mucosally normal.  It was dilated to 18 mm.   INTERVAL HISTORY:  I last saw [name] six weeks ago, at the time of  his EGD.  He had a snug GE junction which was dilated to 18 mm.  He  really got no relief from his intermittent solid food dysphagia.  He  says this happens every other day or so. He does not vomit up food.  He  has not been losing weight.   PHYSICAL EXAMINATION:  Weight 185 pounds, blood pressure 136/68, pulse  72.  CONSTITUTIONAL:  Generally well-appearing.  ABDOMEN:  Soft, nontender, non-distended.  Normal bowel sounds.   ASSESSMENT/PLAN:  This 77 year old man with question of underlying  achalasia:  His wife does tell me that he eats very fast and does not  chew too well.  This can definitely be contributing to his intermittent  dysphagia symptoms.  He did have a snug GE junction, and that raises the  possibility of underlying achalasia.  I have arranged for him to have  esophageal manometry performed.  If these findings are convincing for  achalasia, I will proceed with Botox injection, to see if he gets relief  that way.     [name], MD  Electronically Signed    DPJ/MedQ  DD: 07/19/2007  DT: 07/19/2007  Job #: [id number]   cc:   Willow Ora, MD

## 2011-05-11 NOTE — Procedures (Signed)
ENDOVASCULAR STENT GRAFT EXAM   INDICATION:  Follow up AAA repair.   HISTORY:  Follow up abdominal aortic aneurysm Endograft 09/2008, right  nephrectomy.                           DUPLEX EVALUATION   AAA Sac Size:                 5.45 cm CM AP         5.75 cm CM TRV  Previous Sac Size:            02/10/2009 (CT) 5.8 cm CM AP  02/10/2009 (CT) 5.3 cm CM TRV  Evidence of an endoleak?      No                    No   Velocity Criteria:  Proximal Aorta                89 cm/sec  Proximal Stent Graft          68 cm/sec  Main Body Stent Graft-Mid     47 cm/sec  Right Limb-Proximal           103 cm/sec  Right Limb-Distal             105 cm/sec  Left Limb-Proximal            105 cm/sec  Left Limb-Distal              88 cm/sec  Patent Renal Arteries?        Yes (Left)   IMPRESSION:  1. Patent abdominal aortic aneurysm Endograft with no evidence of      endoleak.  2. Stable abdominal aortic aneurysm sac size measuring 5.45 cm x 5.75      cm.        ___________________________________________  V. Charlena Cross, MD   AS/MEDQ  D:  05/05/2009  T:  05/05/2009  Job:  [id number]

## 2011-05-11 NOTE — Assessment & Plan Note (Signed)
[facility]                         GASTROENTEROLOGY OFFICE NOTE   NAME:[name], [name]                         MRN:          [id number]  DATE:05/10/2007                            DOB:          30-Oct-1927    GI PROBLEM LIST:  1. Recent Hemoccult positive stool, not anemic.  Colonoscopy April      2008 by me was normal except for diverticulosis.  No need for      routine colorectal cancer screening for 10 years.   I last saw [name] at the time of his colonoscopy last month.  He  brought up at that time some intermittent dysphagia that he had been  having, so I arranged for him to see me in the office to discuss this.  It turns out, he has had intermittent solid food dysphagia for about 20  years.  It is not progressive.  He had what sounds like esophageal  dilation done, I believe, in [place] 10 or so years ago.  He  noticed no difference in his symptoms following this dilation.  He  describes food as catching in his upper esophagus, possibly in his  throat.  He has no coughing, but he clearly describes swallowing and  having to have 2 to 3 swallows to get solid food down often times.  He  has modified his diet so he cuts his meat up in quite small pieces to  make sure they go down.  He does not have vomiting.  His weight has been  stable.   CURRENT MEDICATIONS:  Nexium, doxycycline, Centrum Silver, stool  softener.   PHYSICAL EXAMINATION:  Weight 186 pounds.  Blood pressure 142/80.  Pulse  60.  CONSTITUTIONAL:  Generally well appearing.  LUNGS:  Clear to auscultation bilaterally.  HEART:  Regular rate and rhythm.  ABDOMEN:  Soft, non-tender, and non-distended.  Normal bowel sounds.  EXTREMITIES:  No lower extremity edema.   ASSESSMENT AND PLAN:  A 77 year old man with daily solid food only  dysphagia, upper esophagus, nonprogressive.   He has had these symptoms for 20 years.  Dilation did not really help  him in the past, but I  would like to give this another try with EGD and  Savary dilation.  We will arrange for that to be done at his soonest  convenience.     [name] Fee, MD  Electronically Signed    DPJ/MedQ  DD: 05/10/2007  DT: 05/10/2007  Job #: [id number]   cc:   [name] Ora, MD
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
## 2011-05-11 NOTE — Procedures (Signed)
CAROTID DUPLEX EXAM   INDICATION:  Evaluation prior to abdominal aortic aneurysm repair.   HISTORY:  Diabetes:  No.  Cardiac:  No.  Hypertension:  Yes.  Smoking:  Patient is a former smoker.  Previous Surgery:  No.  CV History:  No.  Amaurosis Fugax No, Paresthesias No, Hemiparesis No.                                       RIGHT             LEFT  Brachial systolic pressure:         144               148  Brachial Doppler waveforms:         Triphasic         Triphasic  Vertebral direction of flow:        Antegrade         Antegrade  DUPLEX VELOCITIES (cm/sec)  CCA peak systolic                   45                52  ECA peak systolic                   53                46  ICA peak systolic                   38                36  ICA end diastolic                   11                16  PLAQUE MORPHOLOGY:                  Calcified         Mixed, diffuse  PLAQUE AMOUNT:                      Mild              Minimal  PLAQUE LOCATION:                    Proximal ICA      Distal CCA,  proximal ICA   IMPRESSION:  20-39% internal carotid artery stenosis bilaterally.   ___________________________________________  V. Charlena Cross, MD   MC/MEDQ  D:  09/30/2008  T:  09/30/2008  Job:  [id number]

## 2011-05-11 NOTE — Assessment & Plan Note (Signed)
[facility]                         GASTROENTEROLOGY OFFICE NOTE   NAME:[name] [name]                         MRN:          [id number]  DATE:10/13/2007                            DOB:          February 17, 1927    GI PROBLEM LIST:  1. Recent heme-positive stool, not anemic.  Colonoscopy in April 2008,      by me was normal except for diverticulosis.  No need for routine      colorectal cancer screening for 10 years.  Colonoscopy at that      point.  2. Chronic intermittent solid food dysphagia for at least 20 years.      EGD by me in May 2008, showed a snug GE junction but this was      mucosally normal.  It was dilated to 18 mm.  Esophageal monometry      performed September 2008 showed no signs of achalasia.   INTERVAL HISTORY:  I last saw [name] [name] 2-1/2 months ago.  Since then  he an esophageal monometry that did not support the diagnosis of  achalasia.  He has slowed down his eating a bit, chewing his food  better.  His wife was in the room with him today and she said that she  is really helping him focus on that, and with these dietary changes, he  definitely noted an improvement.   CURRENT MEDICATIONS:  Nexium.  Doxycycline.  Centrum Silver.  Stool  softener.   PHYSICAL EXAM:  Weight 188 pounds, which is up 3 pounds since his last  visit.  Blood pressure 112/60, pulse 62.  CONSTITUTIONAL:  Generally well-appearing.  ABDOMEN:  Soft, nontender, nondistended.  Normal bowel sounds.   ASSESSMENT AND PLAN:  A 77 year old man with intermittent dysphagia.  He  does very well if he does not eat too fast and chews his food well.  He  is not losing weight.  EGD showed no worrisome strictures.  Monometry  did not support the diagnosis of achalasia, and so I think simply  observing him for now is very reasonable.  He knows to call if he has  significant worsening of his symptoms.     [name] Fee, MD  Electronically Signed    DPJ/MedQ  DD:  10/13/2007  DT: 10/14/2007  Job #: [id number]   cc:   [name] Ora, MD

## 2011-05-11 NOTE — Assessment & Plan Note (Signed)
OFFICE VISIT   [name] [name]  DOB:  1927/12/06                                       12/07/2010  EAVWU#:[id number]   REASON FOR VISIT:  Follow up aneurysm repair.   HISTORY:  This is an 77 year old gentleman who underwent endovascular  stent graft on 10/04/2008.  Subsequent to that, he had a right  nephrectomy secondary to right renal cell tumor.  He is down to  approximately 20% renal function in his solitary kidney; therefore, we  are following him with noncontrast CT scans.  There had been a slight  increase in the size of his aneurysm sac at his most recent visit, and  he comes back in today, 6 months later, for follow-up.  He is having no  complaints.  He is exercising regularly.  He has a good appetite.   PHYSICAL EXAMINATION:  Heart rate 69, blood pressure 176/81, respiratory  rate 16.  Generally, he is well-appearing in no distress.  Abdomen is  obese but soft, nontender.  His groin sites are well-healed.  Extremities are warm and well-perfused.   DIAGNOSTIC STUDIES:  CT scan:  Currently I am unable to review his CT  scan.  However, I have spoken with [name] [name], who is the interpreting  radiologist who has reviewed his films.  This aneurysm sac has not  significantly changed in size and may in fact be smaller.  Again, this  is a noncontrast scan.   ASSESSMENT/PLAN:  Status post endovascular aneurysm repair.  The patient  is going to follow up with me in 6 months with an ultrasound.  I think  he is progressing nicely.  We will most likely be able to follow him  with ultrasound from here on out.     [name] Ny, MD  Electronically Signed   VWB/MEDQ  D:  12/07/2010  T:  12/07/2010  Job:  [id number]   cc:   [name] [name], M.D.

## 2011-05-11 NOTE — Assessment & Plan Note (Signed)
OFFICE VISIT   [name], [name]  DOB:  1927-01-14                                       11/03/2009  [name]   HISTORY:  This is an 77 year old gentleman who underwent endovascular  aneurysm repair on October 04, 2008, and he also has undergone a right  nephrectomy secondary to a right renal cell tumor by Dr. Isabel [name].  He had  a mild bump in his creatinine and therefore I am following him without  contrast.  He had an ultrasound approximately 6 months ago which showed  a decrease in the size of his aneurysm.  However this was in comparison  to a CT scan.  Patient has done well since I last saw him.  He has  developed a renal cyst on his remaining kidney that is being followed by  his Nephrologist.  He denies having any abdominal pain.  His creatinine  has been stable over time since his nephrectomy.  His blood pressure  continues to be managed medically.  He is a current smoker, trying to  stop, but continues to smoke approximately half a pack a week.   REVIEW OF SYSTEMS:  Is positive for wheezing.  All others negative as  detailed in the encounter form.   SOCIAL HISTORY:  Again, he continues to smoke 1/4 pack a day.   PHYSICAL EXAMINATION:  Blood pressure is 173/111, pulse 69, temperature  is 98.0.  General:  Well-appearing, no distress.  Respirations are  nonlabored.  Cardiovascular:  Regular rate and rhythm.  Abdomen:  Soft,  nontender.  His groin incisions are well-healed.  He has palpable pedal  pulses.  Neurologically he is intact.  Skin is without rash.  HEENT:  Normocephalic and atraumatic.  Pupils are equal.  Sclerae anicteric.   DIAGNOSTIC STUDIES:  Ultrasound was performed in the office today which  has been independently reviewed by myself.  This reveals a stable  aneurysm sac without evidence of endoleak.   ASSESSMENT/PLAN:  Status post endovascular aneurysm repair.   PLAN:  Plan on seeing the patient back in 6 months.  I am going to  get a  noncontrast CT scan for comparison.  If that has not changed or the  aneurysm sac is decreased in size, we will plan on following this on a  yearly basis.  Overall he is doing very well.   [name] Ny, MD  Electronically Signed   VWB/MEDQ  D:  11/03/2009  T:  11/04/2009  Job:  [id number]   cc:   [name], M.D.

## 2011-05-11 NOTE — Discharge Summary (Signed)
NAMEJACK, BOLIO                ACCOUNT NO.:  [id number]   MEDICAL RECORD NO.:  [id number]          PATIENT TYPE:  INP   LOCATION:  3308                         FACILITY:  [facility]   PHYSICIAN:  Juleen China IV, MDDATE OF BIRTH:  1927/03/06   DATE OF ADMISSION:  10/04/2008  DATE OF DISCHARGE:  10/05/2008                               DISCHARGE SUMMARY   DISCHARGE DIAGNOSIS:  Abdominal aortic aneurysm.   PROCEDURES PERFORMED:  Endovascular repair of abdominal aortic aneurysm  with bilateral common femoral artery exposure, and abdominal angiogram.   COMPLICATIONS:  None.   CONDITION AT DISCHARGE:  Stable and improving.   DISCHARGE MEDICATIONS:  1. Doxycycline 100 mg p.o. daily.  2. Stool softener, over the counter, daily.  3. Multivitamin with iron 1 p.o. daily.  4. Percocet 5/325 one p.o. q.4 h. p.r.n. pain, total #20 were given.   DISPOSITION:  He is being discharged home in stable condition with  wounds healing well.  He is given careful instructions regarding the  care of his wounds.  He is to return to see Dr. Myra Gianotti in 4 weeks with  CTA of his abdomen and pelvis and ABIs.  The office will arrange the  visit.   Brief identifying statement with complete details, please refer to the  typed history and physical.  Briefly, this very pleasant gentleman was  referred to Dr. Myra Gianotti with abdominal aortic aneurysm.  He recommended  endovascular repair.  Mr. Melnyk was informed of the risks and benefits  of the procedure and after careful consideration, elected to proceed  with surgery.   HOSPITAL COURSE:  Preoperative workup was completed.  He was brought in  through the same day surgery and underwent the aforementioned  endovascular repair.  For complete details, please refer to the typed  operative report.  The procedure was without complications.  He was returned to the  postanesthesia care unit, extubated.  Following stabilization, he was  transferred to a bed on the  surgical step-down unit.  He was observed  overnight, and the following morning, he was desirous of discharge.  He  was discharged home in stable condition.      Wilmon Arms, PA      V. Charlena Cross, MD  Electronically Signed    KEL/MEDQ  D:  11/04/2008  T:  11/05/2008  Job:  [id number]
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
## 2011-05-11 NOTE — Assessment & Plan Note (Signed)
OFFICE VISIT   ROUNSAVILLE, Lorimer  DOB:  [birth year]                                       10/09/2008  YQIHK#:[id number]   The patient returns today after placement of aneurysm stent graft on  10/04/2008.  His family and he were both concerned about the fact that  he was having significant scrotal edema and penis edema.  He states that  he is urinating normally with no hematuria.  He has had no drainage from  his groin incisions.   PHYSICAL EXAMINATION:  On physical exam today blood pressure is 141/63  in the left arm, pulse is 72 and regular.  Temperature is 97.9.  Both  groin incisions are healing well.  He has 2+ femoral pulses bilaterally.  He does have some edema in the shaft and distal portion of his penis.  He has no significant scrotal edema.   I reassured the family and told them that this edema should resolve.  It  may be due to some tracking of fluid or blood from his recent stent  graft repair.  This should heal up with conservative measures.  I have  told him that he should wear a scrotal support for the next few weeks  until all of the edema subsides.  He is not having any evidence of  urinary obstruction.  He will follow up at his regular scheduled  appointment with Dr. Myra Gianotti.   Janetta Hora. Fields, MD  Electronically Signed   CEF/MEDQ  D:  10/09/2008  T:  10/10/2008  Job:  [id number]   cc:   [name], MD
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
## 2011-05-11 NOTE — Op Note (Signed)
NAMEEUSTACE, HUR NO.:  [id number]   MEDICAL RECORD NO.:  [id number]          PATIENT TYPE:  INP   LOCATION:  3308                         FACILITY:  [facility]   PHYSICIAN:  Quita Skye. Hart Rochester, M.D.  DATE OF BIRTH:  July 13, 1927   DATE OF PROCEDURE:  10/04/2008  DATE OF DISCHARGE:                               OPERATIVE REPORT   PREOPERATIVE DIAGNOSIS:  Infrarenal abdominal aortic aneurysm.   POSTOPERATIVE DIAGNOSIS:  Infrarenal abdominal aortic aneurysm.   OPERATION:  The portion to be dictated by Dr. Hart Rochester is a left femoral  artery exposure and primary repair following insertion of aortic stent  graft.   SURGEON:  Quita Skye. Hart Rochester, MD   FIRST ASSISTANT:  1. Charlena Cross, MD   Dr. Myra Gianotti will dictate the main body of this procedure.   PROCEDURE:  The patient was taken to the operating room and placed in  supine position at which time satisfactory general endotracheal  anesthesia was administered.  The patient was prepped and draped in  routine sterile manner for aortic stent grafting.  Both femoral arteries  were exposed through suprainguinal oblique incision.  Dr. Hart Rochester doing  the left side and Dr. Myra Gianotti doing the right.  After carrying the  incision down through subcutaneous tissue, the common, superficial, and  profunda femoris arteries were dissected free just distal to the  inguinal ligament and encircled with vessel loops.  Common femoral  artery was a very good vessel with an excellent pulse and some slight  posterior plaquing.  After insertion of Cook-Zenith aortobi-iliac stent  graft and successful deployment, the left common femoral artery was  repaired using continuous 6-0 Prolene suture.  Following appropriate  retrograde and antegrade flushing, this was completed.  The vessel loops  were released.  There was an excellent pulse and Doppler flow on the  left femoral artery.  Following adequate hemostasis, the wound was  closed in layers  with Vicryl in subcuticular fashion.  A sterile  dressing was applied.  The patient taken to recovery room in  satisfactory condition.      Quita Skye Hart Rochester, M.D.  Electronically Signed     JDL/MEDQ  D:  10/04/2008  T:  10/05/2008  Job:  [id number]

## 2011-05-11 NOTE — Procedures (Signed)
ENDOVASCULAR STENT GRAFT EXAM   INDICATION:  Followup of abdominal aortic aneurysm repair.   HISTORY:                           DUPLEX EVALUATION   AAA Sac Size:                 5.5 CM AP             4.4 CM TRV  Previous Sac Size:            5.45 CM AP            5.75 CM TRV  Evidence of an endoleak?      No                    No   Velocity Criteria:  Proximal Aorta                51 cm/sec  Proximal Stent Graft          58 cm/sec  Main Body Stent Graft-Mid     99 cm/sec  Right Limb-Proximal           128 cm/sec  Right Limb-Distal             89 cm/sec  Left Limb-Proximal            138 cm/sec  Left Limb-Distal              142 cm/sec  Patent Renal Arteries?        Yes (left)   IMPRESSION:  1. Patent abdominal aortic aneurysm endograft stent.  2. Stable abdominal aortic aneurysm sac size.  3. Poor visualization of right limb due to bowel gas.   ___________________________________________  V. Charlena Cross, MD   CJ/MEDQ  D:  11/03/2009  T:  11/03/2009  Job:  [id number]

## 2011-05-14 ENCOUNTER — Other Ambulatory Visit (HOSPITAL_COMMUNITY): Payer: Self-pay | Admitting: Internal Medicine

## 2011-05-14 DIAGNOSIS — N281 Cyst of kidney, acquired: Secondary | ICD-10-CM

## 2011-05-14 NOTE — Assessment & Plan Note (Signed)
HEALTHCARE                        [facility] OFFICE NOTE   NAME:Kunkle, Taylor                         MRN:          [id number]  DATE:12/14/2006                            DOB:          [birth year]    CHIEF COMPLAINT:  Here to establish.   HISTORY OF PRESENT ILLNESS:  Mr. Lagos is a 77 year old white male  originally from [place] who has been living in [place]  for the last 33 years and is just now coming back home.  He needs to get  established with a primary care physician in this area.  In general, he  is doing well.  His concern today is the fact that he had surgery for  skin cancer on his lip 10 years ago and thinks that needs to be checked  closely.  He has noticed no change or growth in that area.  He also  needs refill on his PPIs.   PAST MEDICAL HISTORY:  1. Status post bleeding ulcer in 1997, no surgery was done.  2. In 1997, he had a cancer removed from his lip.  3. In 2004, he had a skin cancer removed from his back.  4. Reports 2 colonoscopies in the past.  After the last colonoscopy he      was told he would not need any other.  5. Denies any history of BPH, high cholesterol or hypertension.   FAMILY HISTORY:  1. No family history of colon or prostate cancer or heart disease.  2. Father had a stroke and hypertension.  3. Unknown history of diabetes in the family.   SOCIAL HISTORY:  He smokes for years and lately only from time to time.  No alcohol.  He has been married for 33 years with his second wife.  He  has 2 kids, one from his last marriage.   REVIEW OF SYSTEMS:  Denies any chest pain, shortness of breath or cough,  no nausea and vomiting, diarrhea, blood in the stools or heartburn.   MEDICATIONS:  1. Doxycycline 100 as prescribed by his dermatologist.  2. Stool softener.  3. Vitamins.  4. Nexium.   ALLERGIES:  No known drug allergies.   PHYSICAL EXAMINATION:  The patient is alert and oriented,  in no acute  distress.  He is 5 feet 9 inches tall.  He weighs 182 pounds.  Afebrile,  pulse 60, blood pressure 150/80.  NECK:  Normal carotid pulses.  LUNGS:  Decreased breath sounds but clear.  CARDIOVASCULAR:  Regular rate and rhythm without a murmur.  ABDOMEN:  Not distended, soft, good bowel sounds, no organomegaly.  EXTREMITIES:  No edema.  SKIN:  He has changes consistent with rosacea in his cheeks and nose  with some rhinophyma.  Lower lip on the right showed changes consistent  with previous surgery.  On palpation, I did not notice any nodularity,  There is no lesion that could be suspicious for melanoma that I can  tell.   ASSESSMENT AND PLAN:  1. The patient had GERD.  I did a prescription for Nexium, Prevacid  and Protonix and asked the pharmacist to refill whatever is covered      by his plan.  2. The patient has a history of skin cancer and rosacea.  I offered      him referral to a local dermatologist, however he likes to keep his      dermatologist in [place].  He says that he will see her in      January.  3. Gave the patient pneumonia shot.  4. The patient already had a flu shot.  5. I asked him to get the records from his previous doctor and come      back in 3 months for repeat checkup.     Willow Ora, MD  Electronically Signed    JP/MedQ  DD: 12/14/2006  DT: 12/14/2006  Job #: [id number]

## 2011-05-14 NOTE — Assessment & Plan Note (Signed)
[facility]                         GASTROENTEROLOGY OFFICE NOTE   NAME:Gutierrez, Chrles                         MRN:          [id number]  DATE:03/21/2007                            DOB:          [birth year]    REASON FOR REFERAL:  Hemoccult positive stool.   HISTORY OF PRESENT ILLNESS:  [name] is a very pleasant 77 year old  man who recently moved to the area. He had routine lab tests done as  part of an initial physical, it was found that he was not anemic but he  does have heme positive stool on examination. His MCV was normal and a  basic metabolic profile was also normal. He has never had overt GI  bleeding. He has had a colonoscopy. He had 1 in [place] 5 to 6  years ago, was told it was normal and that he would not need another 1  for 10 years.   REVIEW OF SYSTEMS:  Notable for distant heartburn but he is fine now.  Normal bowel movements, intermittently constipated although this is  relieved with OTC medicines. The rest of review of systems is  essentially normal and is available on his nursing intake sheet.   PAST MEDICAL HISTORY:  Elevated cholesterol, status post hemorrhoid  surgery 15 to 20 years ago, rosacea. GERD.   CURRENT MEDICATIONS:  1. Nexium.  2. Doxicycline.  3. Centrum silver.  4. P.R.N. stool softeners.   ALLERGIES:  No known drug allergies.   SOCIAL HISTORY:  Married, 1 daughter, lives with his wife, used to work  for [employer] in [place]. Nonsmoker, nondrinker.   FAMILY HISTORY:  Father and sister with diabetes, no colon cancer, colon  polyps in family.   PHYSICAL EXAMINATION:  He is 5 feet 9 inches, 191 pounds, blood pressure  132/80, pulse 64.  CONSTITUTIONAL: Generally well-appearing.  NEUROLOGIC: Alert and oriented x3.  EYES: Extraocular movements intact.  MOUTH: Oropharynx moist, no lesions.  NECK: Supple, no lymphadenopathy.  CARDIOVASCULAR: HEART: Regular rate and rhythm.  LUNGS: Clear to  auscultation bilaterally.  ABDOMEN: Soft, nontender, nondistended, normal bowel sounds.  EXTREMITIES: No lower extremity edema.  SKIN: No rashes or lesions on visible extremities.   ASSESSMENT/PLAN:  A 77 year old man with heme positive stool.   We will arrange for him to have colonoscopy at his soonest convenience.  He has no signs of overt bleeding and was not anemic on recent lab  testing and so if this is a normal examination he would not require  repeat colonoscopy for 10 years.     [name], MD  Electronically Signed    DPJ/MedQ  DD: 03/21/2007  DT: 03/21/2007  Job #: [id number]   cc:   Willow Ora, MD

## 2011-05-19 ENCOUNTER — Other Ambulatory Visit: Payer: Self-pay | Admitting: Family Medicine

## 2011-05-25 ENCOUNTER — Ambulatory Visit (HOSPITAL_COMMUNITY)
Admission: RE | Admit: 2011-05-25 | Discharge: 2011-05-25 | Disposition: A | Discharge status: Home / Self Care | Payer: Medicare Other | Source: Ambulatory Visit | Attending: Internal Medicine | Admitting: Internal Medicine

## 2011-05-25 DIAGNOSIS — Z905 Acquired absence of kidney: Secondary | ICD-10-CM | POA: Insufficient documentation

## 2011-05-25 DIAGNOSIS — Z85528 Personal history of other malignant neoplasm of kidney: Secondary | ICD-10-CM | POA: Insufficient documentation

## 2011-05-25 DIAGNOSIS — N400 Enlarged prostate without lower urinary tract symptoms: Secondary | ICD-10-CM | POA: Insufficient documentation

## 2011-05-25 DIAGNOSIS — N281 Cyst of kidney, acquired: Secondary | ICD-10-CM

## 2011-05-31 ENCOUNTER — Ambulatory Visit: Payer: Self-pay | Admitting: Surgery

## 2011-06-01 ENCOUNTER — Ambulatory Visit (INDEPENDENT_AMBULATORY_CARE_PROVIDER_SITE_OTHER): Payer: Medicare Other | Admitting: Internal Medicine

## 2011-06-01 ENCOUNTER — Encounter: Payer: Self-pay | Admitting: Internal Medicine

## 2011-06-01 DIAGNOSIS — J40 Bronchitis, not specified as acute or chronic: Secondary | ICD-10-CM

## 2011-06-01 MED ORDER — FLUTICASONE PROPIONATE 50 MCG/ACT NA SUSP: 2.0000 | Freq: Every day | NASAL | Status: DC

## 2011-06-01 MED ORDER — AZITHROMYCIN 250 MG PO TABS: ORAL_TABLET | ORAL | Status: AC

## 2011-06-01 NOTE — Progress Notes (Signed)
  Subjective:    Patient ID: [name], male    DOB: 1927-07-29, 77 y.o.   MRN: [id number]  HPI Here with his daughter [name] One month history of chest and sinus congestion. Not really feeling bad, not weak, appetite normal.   Past Surgical History  Procedure Date  . Mohs surgery   . Surgery of lip     cancer removed  . Hemorrhoid surgery 1990   History   Social History  . Marital Status: Married    Spouse Name: N/A    Number of Children: 1  . Years of Education: N/A   Occupational History  . Not on file.   Social History Main Topics  . Smoking status: Former Smoker    Quit date: 12/28/2007  . Smokeless tobacco: Not on file   Comment: used to smoke 1.5 ppd  . Alcohol Use: No  . Drug Use: Not on file  . Sexually Active: Not on file   Other Topics Concern  . Not on file   Social History Narrative   Lives w/ wife---Totally independent on ADL---Still drives----Treadmill 2 miles/day---Often came to office w/ his daughter July [phone]), his other daughter is  [name]      Review of Systems No fever or chills He does have cough with yellow sputum production, no hemoptysis. No shortness of breath, no chest pain, does have a lot of clear nasal discharge and postnasal drip    Objective:   Physical Exam  Constitutional: He is oriented to person, place, and time. He appears well-developed and well-nourished.  HENT:  Head: Normocephalic and atraumatic.  Nose: Nose normal.       Sinuses not tender to palpation  Cardiovascular: Normal rate, regular rhythm and normal heart sounds.   No murmur heard. Pulmonary/Chest:       No respiratory distress at rest, good air movement, a few rhonchi with cough only.  Musculoskeletal: He exhibits no edema.  Neurological: He is alert and oriented to person, place, and time.  Psychiatric: He has a normal mood and affect. His behavior is normal. Judgment and thought content normal.          Assessment & Plan:

## 2011-06-01 NOTE — Patient Instructions (Signed)
Rest, fluids , tylenol For cough, take Mucinex DM twice a day as needed  Take the antibiotic as prescribed, zithromax flonase spray to help with nasal congestion  Call if no better in few days Call anytime if the symptoms are severe, you have high fever, short of breath

## 2011-06-01 NOTE — Assessment & Plan Note (Signed)
Symptoms consistent with bronchitis, seen instructions. Instructions were discussed with the patient and his daughter, they will call me if no better, will need a chest x-ray and possibly further w/u

## 2011-06-04 ENCOUNTER — Other Ambulatory Visit (INDEPENDENT_AMBULATORY_CARE_PROVIDER_SITE_OTHER): Payer: Medicare Other

## 2011-06-04 ENCOUNTER — Other Ambulatory Visit: Payer: Self-pay | Admitting: Internal Medicine

## 2011-06-04 DIAGNOSIS — Z0000 Encounter for general adult medical examination without abnormal findings: Secondary | ICD-10-CM

## 2011-06-04 DIAGNOSIS — N184 Chronic kidney disease, stage 4 (severe): Secondary | ICD-10-CM

## 2011-06-04 DIAGNOSIS — I10 Essential (primary) hypertension: Secondary | ICD-10-CM

## 2011-06-04 DIAGNOSIS — D649 Anemia, unspecified: Secondary | ICD-10-CM

## 2011-06-07 ENCOUNTER — Ambulatory Visit (INDEPENDENT_AMBULATORY_CARE_PROVIDER_SITE_OTHER): Payer: Medicare Other | Admitting: Surgery

## 2011-06-07 ENCOUNTER — Encounter (INDEPENDENT_AMBULATORY_CARE_PROVIDER_SITE_OTHER): Payer: Medicare Other

## 2011-06-07 DIAGNOSIS — I714 Abdominal aortic aneurysm, without rupture: Secondary | ICD-10-CM

## 2011-06-08 NOTE — Assessment & Plan Note (Signed)
OFFICE VISIT  [name] [name] DOB:  1927-03-15                                       06/07/2011 ZOXWR#:[id number]  The patient comes back in today for followup.  He is status post endovascular aneurysm repair on 10/04/2008.  Subsequent to that he has undergone right nephrectomy secondary to a right renal cell tumor.  He is down to approximately 20% function in his solitary kidney.  Therefore I have been following him with ultrasound and noncontrast images.  He is doing very well at this time.  His kidney function has remained stable. He is scheduled to see his nephrologist later on today.  He has no complaints.  PHYSICAL EXAMINATION:  Vital signs:  Heart rate 77, blood pressure 145/92, O2 sat 96%, respiratory rate 18.  General:  He is well- appearing, in no distress.  HEENT:  Within normal limits.  Lungs: Respirations nonlabored.  Cardiovascular:  Regular rate and rhythm. Pedal pulses are palpable.  Abdomen:  Soft, nontender, no pulsatile mass.  DIAGNOSTIC STUDIES:  Ultrasound was performed today.  Previous sac measurements were 5.5 x 4.4.  Today is 5.1 x 5.0.  Images were difficult due to bowel gas.  ASSESSMENT AND PLAN:  Status post endovascular aneurysm repair with the inability to follow him with contrast imaging due to poor renal function.  We have been following him with abdominal ultrasound.  His aneurysm sac remains stable in size if not decreasing.  I will plan on seeing him back in 1 year with ultrasound for followup.    [name], MD Electronically Signed  VWB/MEDQ  D:  06/07/2011  T:  06/08/2011  Job:  [id number]  cc:   Valetta Fuller, M.D.

## 2011-07-20 ENCOUNTER — Other Ambulatory Visit: Payer: Self-pay | Admitting: Family Medicine

## 2011-07-26 NOTE — Progress Notes (Signed)
Labs only

## 2011-08-06 NOTE — Progress Notes (Signed)
Labs only

## 2011-08-25 NOTE — Progress Notes (Signed)
Labs only

## 2011-08-27 NOTE — Progress Notes (Signed)
Labs only

## 2011-09-02 NOTE — Progress Notes (Signed)
Labs only

## 2011-09-16 ENCOUNTER — Other Ambulatory Visit: Payer: Self-pay | Admitting: Internal Medicine

## 2011-09-27 LAB — BASIC METABOLIC PANEL
CO2: 30
Calcium: 9.6
Chloride: 108
Creatinine, Ser: 1.2
GFR calc Af Amer: >60
Glucose, Bld: 90
Sodium: 143

## 2011-09-28 LAB — CROSSMATCH: Antibody Screen: NEGATIVE

## 2011-09-28 LAB — BASIC METABOLIC PANEL
BUN: 17
CO2: 25
CO2: 29
Calcium: 7.7 — ABNORMAL LOW
Calcium: 8.3 — ABNORMAL LOW
Chloride: 109
Chloride: 109
Creatinine, Ser: 1.19
Creatinine, Ser: 1.68 — ABNORMAL HIGH
GFR calc Af Amer: 48 — ABNORMAL LOW
GFR calc Af Amer: 36 — ABNORMAL LOW
GFR calc non Af Amer: 40 — ABNORMAL LOW
GFR calc non Af Amer: 30 — ABNORMAL LOW
Glucose, Bld: 94
Glucose, Bld: 98
Potassium: 4.2
Sodium: 144
Sodium: 138

## 2011-09-28 LAB — COMPREHENSIVE METABOLIC PANEL
ALT: 17
AST: 19
AST: 20
Albumin: 3.7
Alkaline Phosphatase: 29 — ABNORMAL LOW
BUN: 14
BUN: 16
CO2: 24
CO2: 24
Calcium: 9.3
Chloride: 105
Chloride: 110
Creatinine, Ser: 1.16
GFR calc Af Amer: >60
GFR calc non Af Amer: >60
Glucose, Bld: 98
Potassium: 4
Potassium: 4.7
Total Bilirubin: 0.9
Total Protein: 6.1

## 2011-09-28 LAB — CBC
HCT: 34.7 — ABNORMAL LOW
HCT: 35.1 — ABNORMAL LOW
HCT: 42.3
HCT: 29.3 — ABNORMAL LOW
Hemoglobin: 12 — ABNORMAL LOW
Hemoglobin: 14.3
MCHC: 33.6
MCHC: 35
MCHC: 35.4
MCV: 92.7
MCV: 94.4
MCV: 94.4
MCV: 91.6
Platelets: 104 — ABNORMAL LOW
Platelets: 141 — ABNORMAL LOW
Platelets: 120 — ABNORMAL LOW
Platelets: 98 — ABNORMAL LOW
RBC: 3.74 — ABNORMAL LOW
RBC: 3.74 — ABNORMAL LOW
RBC: 2.29 — ABNORMAL LOW
RBC: 2.38 — ABNORMAL LOW
RDW: 13.4
RDW: 13.5
RDW: 14
RDW: 14.6
WBC: 2.8 — ABNORMAL LOW
WBC: 3.9 — ABNORMAL LOW
WBC: 5.9
WBC: 3.7 — ABNORMAL LOW

## 2011-09-28 LAB — URINALYSIS, ROUTINE W REFLEX MICROSCOPIC
Hgb urine dipstick: NEGATIVE
Nitrite: NEGATIVE
Specific Gravity, Urine: 1.02
Urobilinogen, UA: 0.2

## 2011-09-28 LAB — TYPE AND SCREEN
ABO/RH(D): A POS
ABO/RH(D): A POS
Antibody Screen: NEGATIVE

## 2011-09-28 LAB — DIFFERENTIAL
Basophils Relative: 0
Basophils Relative: 0
Eosinophils Relative: 7 — ABNORMAL HIGH
Lymphocytes Relative: 21
Monocytes Relative: 12
Neutro Abs: 2.8
Neutro Abs: 1.9
Neutrophils Relative %: 75

## 2011-09-28 LAB — ABO/RH: ABO/RH(D): A POS

## 2011-09-28 LAB — BLOOD GAS, ARTERIAL
Acid-Base Excess: 0.5
Bicarbonate: 24.5 — ABNORMAL HIGH
TCO2: 25.7
pCO2 arterial: 38.6
pH, Arterial: 7.418

## 2011-09-28 LAB — POCT I-STAT 4, (NA,K, GLUC, HGB,HCT)
Hemoglobin: 8.8 — ABNORMAL LOW
Sodium: 138

## 2011-09-28 LAB — HEMOGLOBIN AND HEMATOCRIT, BLOOD
HCT: 34.2 — ABNORMAL LOW
Hemoglobin: 9.1 — ABNORMAL LOW

## 2011-09-28 LAB — POCT I-STAT EG7
Acid-Base Excess: 1
Calcium, Ion: 1.23
HCT: 32 — ABNORMAL LOW
Sodium: 141
pH, Ven: 7.389 — ABNORMAL HIGH

## 2011-09-28 LAB — PROTIME-INR: INR: 1

## 2011-10-19 ENCOUNTER — Encounter: Payer: Self-pay | Admitting: Internal Medicine

## 2011-10-19 ENCOUNTER — Ambulatory Visit (INDEPENDENT_AMBULATORY_CARE_PROVIDER_SITE_OTHER): Payer: Medicare Other | Admitting: Internal Medicine

## 2011-10-19 DIAGNOSIS — N259 Disorder resulting from impaired renal tubular function, unspecified: Secondary | ICD-10-CM

## 2011-10-19 DIAGNOSIS — Z0000 Encounter for general adult medical examination without abnormal findings: Secondary | ICD-10-CM | POA: Insufficient documentation

## 2011-10-19 DIAGNOSIS — C649 Malignant neoplasm of unspecified kidney, except renal pelvis: Secondary | ICD-10-CM

## 2011-10-19 DIAGNOSIS — J449 Chronic obstructive pulmonary disease, unspecified: Secondary | ICD-10-CM

## 2011-10-19 DIAGNOSIS — I10 Essential (primary) hypertension: Secondary | ICD-10-CM

## 2011-10-19 DIAGNOSIS — E7800 Pure hypercholesterolemia, unspecified: Secondary | ICD-10-CM

## 2011-10-19 DIAGNOSIS — Z23 Encounter for immunization: Secondary | ICD-10-CM

## 2011-10-19 DIAGNOSIS — J4489 Other specified chronic obstructive pulmonary disease: Secondary | ICD-10-CM

## 2011-10-19 DIAGNOSIS — D649 Anemia, unspecified: Secondary | ICD-10-CM

## 2011-10-19 LAB — CBC WITH DIFFERENTIAL/PLATELET
Basophils Absolute: 0 10*3/uL (ref 0.0–0.1)
Basophils Relative: 0.4 % (ref 0.0–3.0)
Eosinophils Absolute: 0.1 10*3/uL (ref 0.0–0.7)
MCHC: 34.2 g/dL (ref 30.0–36.0)
MCV: 94.8 fl (ref 78.0–100.0)
Monocytes Absolute: 0.3 10*3/uL (ref 0.1–1.0)
Neutro Abs: 2.2 10*3/uL (ref 1.4–7.7)
Neutrophils Relative %: 65.7 % (ref 43.0–77.0)
RBC: 3.85 Mil/uL — ABNORMAL LOW (ref 4.22–5.81)
RDW: 13.7 % (ref 11.5–14.6)

## 2011-10-19 LAB — LDL CHOLESTEROL, DIRECT: Direct LDL: 134.4 mg/dL

## 2011-10-19 LAB — LIPID PANEL: Triglycerides: 61 mg/dL (ref 0.0–149.0)

## 2011-10-19 MED ORDER — ZOSTER VACCINE LIVE 19400 UNT/0.65ML ~~LOC~~ SOLR: 0.6500 mL | Freq: Once | SUBCUTANEOUS | Status: DC

## 2011-10-19 NOTE — Assessment & Plan Note (Signed)
Former smoker, essentially asymptomatic

## 2011-10-19 NOTE — Assessment & Plan Note (Addendum)
Mild no apparent deficiency anemia. Will check labs

## 2011-10-19 NOTE — Assessment & Plan Note (Signed)
Cholesterol ok  03-2011. Will recheck

## 2011-10-19 NOTE — Progress Notes (Signed)
Subjective:    Patient ID: [name], male    DOB: Aug 21, 1927, 77 y.o.   MRN: [id number]  HPI Here for Medicare AWV, daughter Raynelle Fanning is with him : 1. Risk factors based on Past M, S, F history: reviewed 2. Physical Activities: active, yard work, takes walks, stationary bike 3. Depression/mood: no problems noted or reported  4. Hearing:  Does have hearing aids 5. ADL's:  Still drives , totally independent  6. Fall Risk: no recent problems, counseled  7. home Safety: does feelsafe at home  8. Height, weight, &visual acuity: see VS, vision normal  9. Counseling: provided 10. Labs ordered based on risk factors: if needed  11. Referral Coordination: if needed 12.  Care Plan, see assessment and plan  13.   Cognitive Assessment: Appropriate for age  In addition, today we discussed the following: Renal failure--sees nephrology routinely, therefore we note his BNP AAA--sees vascular surgery routinely, last visit 6 2012, One year according to the daughter. Renal cell carcinoma--sawurology 07-2011, next visit one year, according to the daughter needs a chest x-ray and send it to urology.   Past Medical History  Diagnosis Date  . Abdominal aortic aneurysm 09/2008    s/o endovascular repair and angiogram 10/04/08   . Bleeding ulcer 1997  . Leukopenia 2/11    thought to be from doxy  . Renal cell carcinoma     clear cell type dx 10/09, s/p excision 10-24-08  . Hypertension   . Renal insufficiency   . COPD (chronic obstructive pulmonary disease)    Past Surgical History  Procedure Date  . Mohs surgery     1997 (lip), 2004  . Hemorrhoid surgery 1990  . Nephrectomy 2009    Open nephrectomy for cancer   History   Social History  . Marital Status: Married    Spouse Name: N/A    Number of Children: 3  . Years of Education: N/A   Occupational History  .     Social History Main Topics  . Smoking status: Former Smoker    Quit date: 12/28/2007  . Smokeless tobacco: Never Used   Comment: used to smoke 1.5 ppd  . Alcohol Use: No  . Drug Use: No  . Sexually Active: Not on file   Other Topics Concern  . Not on file   Social History Narrative   Lives w/ wife who has dementia--Totally independent on ADL---Still drives----Often came to office w/ his daughter July [phone]), his other daughter is  [name] , son is Jaire    Family History  Problem Relation Age of Onset  . Diabetes Father     ?  . Stroke Father   . Hypertension Father   . Colon cancer Neg Hx   . Prostate cancer Neg Hx     Review of Systems  Respiratory: Negative for cough and shortness of breath.        Occ wheezing per daughter   Cardiovascular: Negative for chest pain and leg swelling.  Gastrointestinal: Negative for abdominal pain and blood in stool.  Genitourinary: Negative for dysuria, hematuria and difficulty urinating.       Objective:   Physical Exam  Constitutional: He is oriented to person, place, and time. He appears well-developed. No distress.  HENT:  Head: Normocephalic and atraumatic.  Neck:       Normal carotid pulse  Cardiovascular: Normal rate, regular rhythm and normal heart sounds.   No murmur heard. Pulmonary/Chest: Effort normal and breath sounds normal. No respiratory  distress. He has no wheezes. He has no rales.  Abdominal: Soft. He exhibits no distension. There is no tenderness. There is no rebound and no guarding.  Genitourinary:       Prostate slightly enlarged, no nodular or tender  Musculoskeletal: He exhibits no edema.  Neurological: He is alert and oriented to person, place, and time.  Skin: He is not diaphoretic.  Psychiatric: He has a normal mood and affect. His behavior is normal. Judgment and thought content normal.          Assessment & Plan:

## 2011-10-19 NOTE — Assessment & Plan Note (Signed)
BMPs at  nephrology

## 2011-10-19 NOTE — Assessment & Plan Note (Signed)
Well-controlled , BMPs done  at nephrology. EKG today without acute changes, no old EKGs,. He is asymptomatic, thus this EKG is his baseline

## 2011-10-19 NOTE — Assessment & Plan Note (Signed)
Follow up by urology, last visit 07-2011, needs a chest x-ray, will send a copy to Dr. Isabel Caprice

## 2011-10-19 NOTE — Assessment & Plan Note (Addendum)
Td 2011, pneumonia shot  2007, flu shot today Rx for  shingles shot provided  History of elevated PSA in 2011,  PSA was rechecked 05/2001 and it was normal. Recheck next year. DRE today normal  colonoscopy 05/2007 negative except for tics, next in 10 years Diet and exercise as well as fall prevention discussed

## 2011-10-29 ENCOUNTER — Ambulatory Visit (INDEPENDENT_AMBULATORY_CARE_PROVIDER_SITE_OTHER)
Admission: RE | Admit: 2011-10-29 | Discharge: 2011-10-29 | Disposition: A | Discharge status: Home / Self Care | Payer: Medicare Other | Source: Ambulatory Visit | Attending: Internal Medicine | Admitting: Internal Medicine

## 2011-10-29 ENCOUNTER — Telehealth: Payer: Self-pay

## 2011-10-29 DIAGNOSIS — C649 Malignant neoplasm of unspecified kidney, except renal pelvis: Secondary | ICD-10-CM

## 2011-10-29 NOTE — Telephone Encounter (Signed)
Left message to advise pt's daughter of lab results. Labs faxed to [facility] hematology in [place]

## 2011-10-29 NOTE — Telephone Encounter (Signed)
Message copied by Francisco Capuchin on Fri Oct 29, 2011  4:20 PM ------      Message from: Willow Ora E      Created: Fri Oct 29, 2011  3:51 PM       Fax to Dr Isabel Caprice      Tell pt XR ok

## 2011-10-29 NOTE — Telephone Encounter (Signed)
Fax to Dr Isabel Caprice Tell pt XR ok  Left message for pt's daughter to notify X-ray nl and faxed to [facility]

## 2011-11-22 NOTE — Progress Notes (Signed)
Addended by: Legrand Como on: 11/22/2011 03:39 PM   Modules accepted: Orders

## 2011-12-19 ENCOUNTER — Other Ambulatory Visit: Payer: Self-pay | Admitting: Internal Medicine

## 2012-01-12 DIAGNOSIS — H04129 Dry eye syndrome of unspecified lacrimal gland: Secondary | ICD-10-CM | POA: Diagnosis not present

## 2012-02-14 ENCOUNTER — Other Ambulatory Visit: Payer: Self-pay | Admitting: Internal Medicine

## 2012-02-14 NOTE — Telephone Encounter (Signed)
Refill done.  

## 2012-02-24 DIAGNOSIS — N184 Chronic kidney disease, stage 4 (severe): Secondary | ICD-10-CM | POA: Diagnosis not present

## 2012-02-24 DIAGNOSIS — I10 Essential (primary) hypertension: Secondary | ICD-10-CM | POA: Diagnosis not present

## 2012-02-28 DIAGNOSIS — I10 Essential (primary) hypertension: Secondary | ICD-10-CM | POA: Diagnosis not present

## 2012-02-28 DIAGNOSIS — N184 Chronic kidney disease, stage 4 (severe): Secondary | ICD-10-CM | POA: Diagnosis not present

## 2012-02-28 DIAGNOSIS — R609 Edema, unspecified: Secondary | ICD-10-CM | POA: Diagnosis not present

## 2012-02-28 DIAGNOSIS — N390 Urinary tract infection, site not specified: Secondary | ICD-10-CM | POA: Diagnosis not present

## 2012-04-10 ENCOUNTER — Ambulatory Visit (INDEPENDENT_AMBULATORY_CARE_PROVIDER_SITE_OTHER): Payer: Medicare Other | Admitting: Internal Medicine

## 2012-04-10 ENCOUNTER — Encounter: Payer: Self-pay | Admitting: Internal Medicine

## 2012-04-10 VITALS — BP 138/72 | HR 64 | Temp 98.1°F | Wt 173.0 lb

## 2012-04-10 DIAGNOSIS — E7800 Pure hypercholesterolemia, unspecified: Secondary | ICD-10-CM | POA: Diagnosis not present

## 2012-04-10 DIAGNOSIS — I10 Essential (primary) hypertension: Secondary | ICD-10-CM

## 2012-04-10 NOTE — Assessment & Plan Note (Signed)
well controlled, nor change

## 2012-04-10 NOTE — Assessment & Plan Note (Signed)
So far, decided not to take zostavax

## 2012-04-10 NOTE — Assessment & Plan Note (Signed)
Satisfactory per her last cholesterol panel.

## 2012-04-10 NOTE — Progress Notes (Signed)
  Subjective:    Patient ID: [name], male    DOB: Mar 10, 1927, 78 y.o.   MRN: [id number]  HPI Routine visit. Here with his daughter, feeling well, has seen hematology and nephrology since her last office visit, no medication changes.  Past Medical History  Diagnosis Date  . Abdominal aortic aneurysm 09/2008    s/o endovascular repair and angiogram 10/04/08   . Bleeding ulcer 1997  . Leukopenia 2/11    thought to be from doxy  . Renal cell carcinoma     clear cell type dx 10/09, s/p excision 10-24-08  . Hypertension   . Renal insufficiency   . COPD (chronic obstructive pulmonary disease)    Past Surgical History  Procedure Date  . Mohs surgery     1997 (lip), 2004  . Hemorrhoid surgery 1990  . Nephrectomy 2009    Open nephrectomy for cancer      Review of Systems Compliance weight BP medications, ambulatory BP 130/70 on average. Appetite normal.     Objective:   Physical Exam  General -- alert, well-developed, and well-nourished. NAD  Lungs -- normal respiratory effort, no intercostal retractions, no accessory muscle use, and normal breath sounds.   Heart-- normal rate, regular rhythm, no murmur, and no gallop.   Extremities-- trace symmetric pretibial edema bilaterally        Assessment & Plan:

## 2012-04-18 ENCOUNTER — Ambulatory Visit: Payer: Medicare Other | Admitting: Internal Medicine

## 2012-06-05 ENCOUNTER — Other Ambulatory Visit: Payer: Medicare Other

## 2012-06-05 ENCOUNTER — Ambulatory Visit: Payer: Medicare Other | Admitting: Surgery

## 2012-06-19 ENCOUNTER — Ambulatory Visit: Payer: Medicare Other | Admitting: Neurosurgery

## 2012-06-30 ENCOUNTER — Encounter: Payer: Self-pay | Admitting: Internal Medicine

## 2012-06-30 ENCOUNTER — Ambulatory Visit (INDEPENDENT_AMBULATORY_CARE_PROVIDER_SITE_OTHER): Payer: Medicare Other | Admitting: Internal Medicine

## 2012-06-30 VITALS — BP 122/78 | HR 61 | Temp 98.2°F

## 2012-06-30 DIAGNOSIS — H9192 Unspecified hearing loss, left ear: Secondary | ICD-10-CM

## 2012-06-30 DIAGNOSIS — H9190 Unspecified hearing loss, unspecified ear: Secondary | ICD-10-CM | POA: Diagnosis not present

## 2012-06-30 DIAGNOSIS — H7190 Unspecified cholesteatoma, unspecified ear: Secondary | ICD-10-CM | POA: Diagnosis not present

## 2012-06-30 DIAGNOSIS — H7192 Unspecified cholesteatoma, left ear: Secondary | ICD-10-CM

## 2012-06-30 NOTE — Patient Instructions (Addendum)
Share results with ENT specialist 

## 2012-06-30 NOTE — Progress Notes (Signed)
  Subjective:    Patient ID: [name], male    DOB: 1927/05/30, 78 y.o.   MRN: [id number]  HPI He's had an acute loss of hearing on the left 2-3 weeks ago. The audiologist states that his hearing aid is working fine, but there is material in the left ear impeding his hearing. He's noticed a "beating" in the ear but no frank tinnitus.  He cleans his ears with a Q-tip    Review of Systems  He denies associated frontal headache, facial pain, nasal purulence, fever, chills, or sweats.     Objective:   Physical Exam   He appears healthy and well-nourished; he appears younger than his stated age  Ptosis is present on the left; extraocular motion is intact without nystagmus  Nares are patent without any significant lesions  Oropharynx is unremarkable; he has complete dentures  There is some residual wax in the left canal, but the most striking change is a waxy, rounded lesion in the mid- posterior area of the tympanic membrane  He has no lymphadenopathy about the neck or axilla  He has an S4 with slurring; there is some respiratory variation to his murmur. I appreciate no left carotid bruit       Assessment & Plan:  #1 hearing loss; I question  cholesteatoma  Plan: ENT consultation for confirmation and definitive treatment.

## 2012-07-06 DIAGNOSIS — H74329 Partial loss of ear ossicles, unspecified ear: Secondary | ICD-10-CM | POA: Diagnosis not present

## 2012-07-06 DIAGNOSIS — H6980 Other specified disorders of Eustachian tube, unspecified ear: Secondary | ICD-10-CM | POA: Diagnosis not present

## 2012-07-06 DIAGNOSIS — H6690 Otitis media, unspecified, unspecified ear: Secondary | ICD-10-CM | POA: Diagnosis not present

## 2012-07-10 DIAGNOSIS — D638 Anemia in other chronic diseases classified elsewhere: Secondary | ICD-10-CM | POA: Diagnosis not present

## 2012-07-10 DIAGNOSIS — D696 Thrombocytopenia, unspecified: Secondary | ICD-10-CM | POA: Diagnosis not present

## 2012-07-10 DIAGNOSIS — D72819 Decreased white blood cell count, unspecified: Secondary | ICD-10-CM | POA: Diagnosis not present

## 2012-07-17 DIAGNOSIS — D509 Iron deficiency anemia, unspecified: Secondary | ICD-10-CM | POA: Diagnosis not present

## 2012-07-18 DIAGNOSIS — H908 Mixed conductive and sensorineural hearing loss, unspecified: Secondary | ICD-10-CM | POA: Diagnosis not present

## 2012-07-18 DIAGNOSIS — H74329 Partial loss of ear ossicles, unspecified ear: Secondary | ICD-10-CM | POA: Diagnosis not present

## 2012-07-18 DIAGNOSIS — H6980 Other specified disorders of Eustachian tube, unspecified ear: Secondary | ICD-10-CM | POA: Diagnosis not present

## 2012-07-18 DIAGNOSIS — I10 Essential (primary) hypertension: Secondary | ICD-10-CM | POA: Diagnosis not present

## 2012-07-24 ENCOUNTER — Ambulatory Visit (HOSPITAL_BASED_OUTPATIENT_CLINIC_OR_DEPARTMENT_OTHER)
Admission: RE | Admit: 2012-07-24 | Discharge: 2012-07-24 | Disposition: A | Discharge status: Home / Self Care | Payer: Medicare Other | Source: Ambulatory Visit | Attending: Internal Medicine | Admitting: Internal Medicine

## 2012-07-24 ENCOUNTER — Encounter: Payer: Self-pay | Admitting: Internal Medicine

## 2012-07-24 ENCOUNTER — Ambulatory Visit (INDEPENDENT_AMBULATORY_CARE_PROVIDER_SITE_OTHER): Payer: Medicare Other | Admitting: Internal Medicine

## 2012-07-24 VITALS — BP 142/84 | HR 58 | Temp 98.3°F | Wt 173.0 lb

## 2012-07-24 DIAGNOSIS — M7989 Other specified soft tissue disorders: Secondary | ICD-10-CM | POA: Diagnosis not present

## 2012-07-24 DIAGNOSIS — M79609 Pain in unspecified limb: Secondary | ICD-10-CM | POA: Diagnosis not present

## 2012-07-24 DIAGNOSIS — M25559 Pain in unspecified hip: Secondary | ICD-10-CM | POA: Insufficient documentation

## 2012-07-24 DIAGNOSIS — M79669 Pain in unspecified lower leg: Secondary | ICD-10-CM

## 2012-07-24 NOTE — Progress Notes (Signed)
  Subjective:    Patient ID: [name], male    DOB: [birth year], 78 y.o.   MRN: [id number]  HPI Acute visit, here with his daughter. 3 days ago, he developed pain at the right leg, located at the external side, from the knee to the hip, on and off, very sharp and intense as soon as he starts walking, less intense as he continue to walk. This morning, the daughter noted the R leg to be a slightly larger than the left The daughter also reports that he has been referred to GI for low iron and they are planning to do endoscopies  Past Medical History  Diagnosis Date  . Abdominal aortic aneurysm 09/2008    s/o endovascular repair and angiogram 10/04/08   . Bleeding ulcer 1997  . Leukopenia 2/11    thought to be from doxy  . Renal cell carcinoma     clear cell type dx 10/09, s/p excision 10-24-08  . Hypertension   . Renal insufficiency   . COPD (chronic obstructive pulmonary disease)    Past Surgical History  Procedure Date  . Mohs surgery     1997 (lip), 2004  . Hemorrhoid surgery 1990  . Nephrectomy 2009    Open nephrectomy for cancer    Review of Systems Denies any recent falls No back pain or rash in the back No lower extremity paresthesias No fever or chills No bladder or bowel incontinence.     Objective:   Physical Exam General -- alert, well-developed, and well-nourished.   Abdomen--soft, non-tender, no distention, no masses, no HSM, no guarding, and no rigidity.   Extremities--  trace pitting edema bilaterally around the ankles. Not tender at the trochanteric bursas. Rotation of the hips and flexion of the knees without pain. Walks without difficulty. Femoral and  pedal pulses normal. Neurologic-- alert & oriented X3, DTRs and strength normal lower  extremities. Psych-- not anxious appearing and not depressed appearing.      Assessment & Plan:

## 2012-07-24 NOTE — Patient Instructions (Addendum)
Have the  x-ray at the same facility you have the ultrasound at. Tylenol as for pain Call if you are not improving in the next few days. Call anytime if you get worse. Next office visit around October 2013

## 2012-07-24 NOTE — Assessment & Plan Note (Signed)
3 days history of right leg pain, also swelling noted today, onset of the swelling unclear. No history of recent injury. Pain is atypical for arthritis, no evidence of peripheral vascular disease on exam. Radiculopathy? Plan: Hip x-ray Ultrasound to rule out DVT Tylenol as needed.  If not better, will need further eval

## 2012-07-25 NOTE — Addendum Note (Signed)
Addended by: Edwena Felty T on: 07/25/2012 11:59 AM   Modules accepted: Orders

## 2012-07-26 ENCOUNTER — Telehealth: Payer: Self-pay | Admitting: Internal Medicine

## 2012-07-26 ENCOUNTER — Ambulatory Visit (INDEPENDENT_AMBULATORY_CARE_PROVIDER_SITE_OTHER): Payer: Medicare Other | Admitting: Internal Medicine

## 2012-07-26 VITALS — BP 142/88 | HR 69 | Temp 98.4°F | Wt 173.0 lb

## 2012-07-26 DIAGNOSIS — D649 Anemia, unspecified: Secondary | ICD-10-CM | POA: Diagnosis not present

## 2012-07-26 DIAGNOSIS — M25559 Pain in unspecified hip: Secondary | ICD-10-CM | POA: Diagnosis not present

## 2012-07-26 DIAGNOSIS — R195 Other fecal abnormalities: Secondary | ICD-10-CM

## 2012-07-26 DIAGNOSIS — L309 Dermatitis, unspecified: Secondary | ICD-10-CM

## 2012-07-26 DIAGNOSIS — L259 Unspecified contact dermatitis, unspecified cause: Secondary | ICD-10-CM

## 2012-07-26 DIAGNOSIS — M25519 Pain in unspecified shoulder: Secondary | ICD-10-CM | POA: Diagnosis not present

## 2012-07-26 MED ORDER — NYSTATIN-TRIAMCINOLONE 100000-0.1 UNIT/GM-% EX OINT: TOPICAL_OINTMENT | Freq: Two times a day (BID) | CUTANEOUS | Status: DC

## 2012-07-26 NOTE — Telephone Encounter (Signed)
Advise patient to come to the office now, will work him in

## 2012-07-26 NOTE — Telephone Encounter (Signed)
Spoke with pt's daughter & she is bringing him to the office now.

## 2012-07-26 NOTE — Patient Instructions (Addendum)
Call anytime if the stools get really black, tarry or bloody. Call if you have stomach pain. Use the cream twice a day for at least one week, call if they foreskin is not getting better

## 2012-07-26 NOTE — Progress Notes (Signed)
  Subjective:    Patient ID: [name], male    DOB: Jul 03, 1927, 78 y.o.   MRN: [id number]  HPI Acute visit, here with his daughter This morning, the patient noted his stools to be black. Could not tell me if they were tarry, they were definitely not bloody. Additionally, noted his foreskin to be swelling few hours ago.  Past Medical History  Diagnosis Date  . Abdominal aortic aneurysm 09/2008    s/o endovascular repair and angiogram 10/04/08   . Bleeding ulcer 1997  . Leukopenia 2/11    thought to be from doxy  . Renal cell carcinoma     clear cell type dx 10/09, s/p excision 10-24-08  . Hypertension   . Renal insufficiency   . COPD (chronic obstructive pulmonary disease)     Past Surgical History  Procedure Date  . Mohs surgery     1997 (lip), 2004  . Hemorrhoid surgery 1990  . Nephrectomy 2009    Open nephrectomy for cancer   Review of Systems No stomach pain. No nausea or vomiting. Appetite is normal. No fever or chills. Bowel movements are regular, daily, consistently of the stools normal. Taking iron supplements x few weeks but no Motrin or any other NSAID    Objective:   Physical Exam Alert, no apparent distress. Not pale or jaundice. Abdomen: Not distended, soft, nontender. Digital rectal exam, no external abnormalities, no rectal mass, stools are dark green, Hemoccult negative. GU: Foreskin slightly red and swollen.      Assessment & Plan:   Change in the color of his stools, Patient presents with a change in the color of the stools, physical exam is benign, he is Hemoccult negative. Hemoglobin point-of-care today is 11.2, similar to the hemoglobin obtained at hematology ~ 2 weeks ago. I don't think he is acutely bleeding, he does have some degree of anemia and his hematologist already referred him to GI. Plan: Observation, will call if he has abdominal pain, tarry stools or bloody stools.  Foreskin dermatitis, fungal?, will call if not improving after  one week of mycolog

## 2012-07-26 NOTE — Telephone Encounter (Signed)
Caller: Julie/Child; PCP: Willow Ora; CB#: [phone]; Call regarding Patient Has Black Stools and Daughter Thinks Is Bleeding; Onset 07/26/12. Caller states the entire stool was black and she thinks it was blood.  Emergent sx ruled out.  Home care for the interim and see provider within 4 hours per GI Bleeding protocol. Caller refused appt. with Dr. Alwyn Ren; note to office to inquire about possible appointment with PCP.  Caller requests callback at    [phone].

## 2012-07-27 ENCOUNTER — Encounter: Payer: Self-pay | Admitting: Internal Medicine

## 2012-07-28 ENCOUNTER — Encounter: Payer: Self-pay | Admitting: Surgery

## 2012-07-31 ENCOUNTER — Ambulatory Visit (INDEPENDENT_AMBULATORY_CARE_PROVIDER_SITE_OTHER): Payer: Medicare Other | Admitting: *Deleted

## 2012-07-31 ENCOUNTER — Ambulatory Visit (INDEPENDENT_AMBULATORY_CARE_PROVIDER_SITE_OTHER): Payer: Medicare Other | Admitting: Surgery

## 2012-07-31 ENCOUNTER — Encounter: Payer: Self-pay | Admitting: Surgery

## 2012-07-31 VITALS — BP 165/77 | HR 65 | Temp 97.6°F | Ht 69.0 in | Wt 171.0 lb

## 2012-07-31 DIAGNOSIS — I714 Abdominal aortic aneurysm, without rupture, unspecified: Secondary | ICD-10-CM

## 2012-07-31 DIAGNOSIS — Z48812 Encounter for surgical aftercare following surgery on the circulatory system: Secondary | ICD-10-CM

## 2012-07-31 DIAGNOSIS — I6529 Occlusion and stenosis of unspecified carotid artery: Secondary | ICD-10-CM

## 2012-07-31 NOTE — Addendum Note (Signed)
Addended by: Sharee Pimple on: 07/31/2012 09:49 AM   Modules accepted: Orders

## 2012-07-31 NOTE — Progress Notes (Signed)
Vascular and Vein Specialist of [facility]   Patient name: [name] MRN: [id number] DOB: December 06, 1927 Sex: male     Chief Complaint  Patient presents with  . AAA    1 year f/u    HISTORY OF PRESENT ILLNESS: The patient returns today for followup. I last saw him one year ago. He is status post endovascular aneurysm repair on 10/04/2008. Subsequent to that he has undergone right nephrectomy secondary to a right renal cell tumor. He is down to approximately 20% function of his solitary kidney. For that reason I have been following him with ultrasound and noncontrast images. He has been doing very well. Recently, however he has noticed darker colored stool with anemia. He has been referred to a gastroenterologist and is scheduled for colonoscopy and upper endoscopy.  Past Medical History  Diagnosis Date  . Abdominal aortic aneurysm 09/2008    s/o endovascular repair and angiogram 10/04/08   . Bleeding ulcer 1997  . Leukopenia 2/11    thought to be from doxy  . Renal cell carcinoma     clear cell type dx 10/09, s/p excision 10-24-08  . Hypertension   . Renal insufficiency   . COPD (chronic obstructive pulmonary disease)     Past Surgical History  Procedure Date  . Mohs surgery     1997 (lip), 2004  . Hemorrhoid surgery 1990  . Nephrectomy 2009    Open nephrectomy for cancer    History   Social History  . Marital Status: Married    Spouse Name: N/A    Number of Children: 3  . Years of Education: N/A   Occupational History  .     Social History Main Topics  . Smoking status: Former Smoker    Quit date: 12/28/2007  . Smokeless tobacco: Never Used   Comment: used to smoke 1.5 ppd  . Alcohol Use: No  . Drug Use: No  . Sexually Active: Not on file   Other Topics Concern  . Not on file   Social History Narrative   Lives w/ wife who has dementia--Totally independent on ADL---Still drives----Often came to office w/ his daughter July [phone]), his other daughter is   [name] , son is Vera     Family History  Problem Relation Age of Onset  . Diabetes Father     ?  . Stroke Father   . Hypertension Father   . Colon cancer Neg Hx   . Prostate cancer Neg Hx     Allergies as of 07/31/2012  . (No Known Allergies)    Current Outpatient Prescriptions on File Prior to Visit  Medication Sig Dispense Refill  . amLODipine (NORVASC) 5 MG tablet Take 10 mg by mouth daily.       Jennette Banker Sodium 30-100 MG CAPS Take by mouth.        . FeFum-FePo-FA-B Cmp-C-Zn-Mn-Cu (SE-TAN PLUS) 162-115.2-1 MG CAPS Take 1 tablet by mouth daily. Take 1/2 daily      . Multiple Vitamins-Iron (MULTIVITAMIN/IRON PO) Take by mouth.        Marland Kitchen omeprazole (PRILOSEC) 20 MG capsule TAKE 1 CAPSULE BY MOUTH EVERY DAY  90 capsule  3  . torsemide (DEMADEX) 10 MG tablet Take 10 mg by mouth daily.        Marland Kitchen VITAMIN D, CHOLECALCIFEROL, PO Take by mouth.        . nystatin-triamcinolone ointment (MYCOLOG) Apply topically 2 (two) times daily.  30 g  0     REVIEW OF  SYSTEMS: 3 see history of present illness, otherwise no changes from prior visit.  PHYSICAL EXAMINATION:   Vital signs are BP 165/77  Pulse 65  Temp 97.6 F (36.4 C) (Oral)  Ht 5\' 9"  (1.753 m)  Wt 171 lb (77.565 kg)  BMI 25.25 kg/m2  SpO2 99% General: The patient appears their stated age. HEENT:  No gross abnormalities Pulmonary:  Non labored breathing Abdomen: Soft and non-tender aorta is nonpalpable Musculoskeletal: There are no major deformities. Neurologic: No focal weakness or paresthesias are detected, Skin: There are no ulcer or rashes noted. Psychiatric: The patient has normal affect. Cardiovascular: Palpable femoral pulses   Diagnostic Studies I have ordered and reviewed his ultrasound. This shows no change in his aneurysm size over the past year. Today it measures 5.1 x 5.1 cm. The original size of his aneurysm was 5.8 cm.  Assessment: Status post endovascular aneurysm repair Plan: From my  perspective the patient is doing very well. His aneurysm size has remained stable over the past year. I will continue with yearly ultrasound evaluation. This is do to his renal function. He is scheduled to see my nurse practitioner in one year.  [name], M.D. [facility] of [place] Office: [phone] Pager:  [phone]

## 2012-08-07 NOTE — Procedures (Unsigned)
VASCULAR LAB EXAM  INDICATION:  Followup abdominal aortic aneurysm endograft placed 10/04/2008.  HISTORY: Right nephrectomy. Diabetes:  No Cardiac:  No Hypertension:  Yes  EXAM:  AAA sac size 5.1 cm AP, 5.10 cm transverse. Previous sac size date 06/07/2011 5.12 cm AP, 5.05 cm transverse.  IMPRESSION: 1. The aorta and endograft appear patent. 2. No significant change in size of the aneurysmal sac surrounding the     endograft. 3. No evidence of endoleak was detected.  ___________________________________________ V. Charlena Cross, MD  SS/MEDQ  D:  07/31/2012  T:  07/31/2012  Job:  [id number]

## 2012-08-25 DIAGNOSIS — I10 Essential (primary) hypertension: Secondary | ICD-10-CM | POA: Diagnosis not present

## 2012-08-25 DIAGNOSIS — M545 Low back pain: Secondary | ICD-10-CM | POA: Diagnosis not present

## 2012-08-25 DIAGNOSIS — M25559 Pain in unspecified hip: Secondary | ICD-10-CM | POA: Diagnosis not present

## 2012-08-25 DIAGNOSIS — N184 Chronic kidney disease, stage 4 (severe): Secondary | ICD-10-CM | POA: Diagnosis not present

## 2012-08-30 DIAGNOSIS — N039 Chronic nephritic syndrome with unspecified morphologic changes: Secondary | ICD-10-CM | POA: Diagnosis not present

## 2012-08-30 DIAGNOSIS — I10 Essential (primary) hypertension: Secondary | ICD-10-CM | POA: Diagnosis not present

## 2012-08-30 DIAGNOSIS — N390 Urinary tract infection, site not specified: Secondary | ICD-10-CM | POA: Diagnosis not present

## 2012-08-30 DIAGNOSIS — N184 Chronic kidney disease, stage 4 (severe): Secondary | ICD-10-CM | POA: Diagnosis not present

## 2012-08-30 DIAGNOSIS — D631 Anemia in chronic kidney disease: Secondary | ICD-10-CM | POA: Diagnosis not present

## 2012-09-01 DIAGNOSIS — K2940 Chronic atrophic gastritis without bleeding: Secondary | ICD-10-CM | POA: Diagnosis not present

## 2012-09-01 DIAGNOSIS — K2970 Gastritis, unspecified, without bleeding: Secondary | ICD-10-CM | POA: Diagnosis not present

## 2012-09-01 DIAGNOSIS — D508 Other iron deficiency anemias: Secondary | ICD-10-CM | POA: Diagnosis not present

## 2012-09-01 DIAGNOSIS — K2990 Gastroduodenitis, unspecified, without bleeding: Secondary | ICD-10-CM | POA: Diagnosis not present

## 2012-09-01 DIAGNOSIS — Z1211 Encounter for screening for malignant neoplasm of colon: Secondary | ICD-10-CM | POA: Diagnosis not present

## 2012-09-01 DIAGNOSIS — K319 Disease of stomach and duodenum, unspecified: Secondary | ICD-10-CM | POA: Diagnosis not present

## 2012-09-04 DIAGNOSIS — K2970 Gastritis, unspecified, without bleeding: Secondary | ICD-10-CM | POA: Diagnosis not present

## 2012-09-04 DIAGNOSIS — K2990 Gastroduodenitis, unspecified, without bleeding: Secondary | ICD-10-CM | POA: Diagnosis not present

## 2012-10-11 ENCOUNTER — Ambulatory Visit (INDEPENDENT_AMBULATORY_CARE_PROVIDER_SITE_OTHER): Payer: Medicare Other | Admitting: Internal Medicine

## 2012-10-11 VITALS — BP 136/78 | HR 61 | Temp 98.3°F | Ht 68.5 in | Wt 171.0 lb

## 2012-10-11 DIAGNOSIS — Z23 Encounter for immunization: Secondary | ICD-10-CM | POA: Diagnosis not present

## 2012-10-11 DIAGNOSIS — D649 Anemia, unspecified: Secondary | ICD-10-CM

## 2012-10-11 DIAGNOSIS — E7800 Pure hypercholesterolemia, unspecified: Secondary | ICD-10-CM

## 2012-10-11 DIAGNOSIS — I10 Essential (primary) hypertension: Secondary | ICD-10-CM

## 2012-10-11 DIAGNOSIS — J4489 Other specified chronic obstructive pulmonary disease: Secondary | ICD-10-CM

## 2012-10-11 DIAGNOSIS — J449 Chronic obstructive pulmonary disease, unspecified: Secondary | ICD-10-CM

## 2012-10-11 DIAGNOSIS — Z0000 Encounter for general adult medical examination without abnormal findings: Secondary | ICD-10-CM | POA: Diagnosis not present

## 2012-10-11 DIAGNOSIS — M1990 Unspecified osteoarthritis, unspecified site: Secondary | ICD-10-CM

## 2012-10-11 LAB — LIPID PANEL
Cholesterol: 193 mg/dL (ref 0–200)
HDL: 43 mg/dL (ref >39.00)
Triglycerides: 99 mg/dL (ref 0.0–149.0)

## 2012-10-11 LAB — ALT: ALT: 15 U/L (ref 0–53)

## 2012-10-11 MED ORDER — ZOSTER VACCINE LIVE 19400 UNT/0.65ML ~~LOC~~ SOLR: 0.6500 mL | Freq: Once | SUBCUTANEOUS | Status: DC

## 2012-10-11 NOTE — Assessment & Plan Note (Signed)
Td  2011 Flu shot today pneumonia shot  2007 and 09-2012 shingles shot Rx provided   most labs done elsewhere Sees urology  colonoscopy 05/2007 negative except for tics, Cscope again for anemia 09-11-12:same result EGD 09-11-12-- gastritis

## 2012-10-11 NOTE — Assessment & Plan Note (Addendum)
Asymptomatic, able to go 2 miles in the treadmill without problems, on exam he has some large airway congestion No PFTs found, we'll order PFTs.

## 2012-10-11 NOTE — Progress Notes (Signed)
  Subjective:    Patient ID: [name], male    DOB: Dec 26, 1927, 78 y.o.   MRN: [id number]  HPI Here for Medicare AWV:  1. Risk factors based on Past M, S, F history: reviewed 2. Physical Activities:  Active at home, walks 2 miles a day at the [place] 3. Depression/mood:  No problemss noted or reported  4. Hearing:  Uses hearing aids 5. ADL's:  Independent, see SH 6. Fall Risk: prevention discussed  7. home Safety: does feelsafe at home  8. Height, weight, &visual acuity: see VS, vision ok sees eye doctor yearly 9. Counseling: provided 10. Labs ordered based on risk factors: if needed  11. Referral Coordination: if needed 12.  Care Plan, see assessment and plan  13.   Cognitive Assessment: Motor skills and cognition appropriate for age  In addition, today we discussed the following: Saw orthopedic surgery in reference to DJD, he prescribed   physical therapy ( needs a referral ) Anemia, hematology referred him to GI, on 09/01/2012 he had a colonoscopy and EGD. GI is recommended to do a capsule test , patient not sure if he needs to proceed. Hypertension good medication compliance, no apparent side effects, ambulatory BPs within normal Recently saw vascular surgery, for AA f/u , told ok   Past Medical History: Bleeding Ulcer-1997 Abdominal aortic aneurysm Dx 10-09---> s/p endovascular repair and angiogram 10-04-08 carotid u/s <30% B (10-09) RENAL CELL CARCINOMA, CLEAR CELL TYPE   Dx 10-09, s/p excision 10-24-08 Hypertension Renal insufficiency Anemia,  EGD Cscope 08-2012 COPD  history of leukopenia thought to be from doxycycline (2-11)  Past Surgical History: 1997- Lip Cancer Removed 2004-Skin Cancer Removed Hemorrhoidectomy (4098'J)  Social History: Married, wife w/ advance dementia, daughter July ([phone]) very involved in his care totally independent on ADL still drives  quit tobacco 2009 Alcohol use-no  Family History: DM-- F? Stroke-- + HTN-- + colon ca--  no prostate ca--no  Review of Systems No chest pain, shortness of breath No  nausea, vomiting, diarrhea No dysuria or gross hematuria Has a history of COPD, denies cough or shortness of breath     Objective:   Physical Exam  General -- alert, well-developed . No apparent distress.  Neck --no LADs Lungs -- some large airway congestion, mostly on the left base, no wheezing, no increased work of breathing   Heart-- normal rate, regular rhythm, no murmur, and no gallop.   Abdomen--soft, non-tender, no distention, no masses  Extremities-- no pretibial edema bilaterally  Neurologic-- alert & oriented X3 (did say it was September when is October)  Psych-- Cognition and judgment appear intact. Alert and cooperative with normal attention span and concentration.  not anxious appearing and not depressed appearing.       Assessment & Plan:

## 2012-10-11 NOTE — Assessment & Plan Note (Signed)
Hematology referred him to GI, had a C scope and EGD, other than diverticuli and gastritis scopes were okay. Patient wonders if he should proceed with a capsule endoscopy to evaluate the intestines. My advice is to discuss further with nephrology, they think anemia is due to renal insufficiency. Also, is okay to proceed with a capsule endoscopy if he is willing to have abdominal surgery in case a tumor is found.

## 2012-10-11 NOTE — Assessment & Plan Note (Signed)
Having shoulder and hip pain, status post ortho eval, will refer to physical therapy close to home in [place]

## 2012-10-11 NOTE — Assessment & Plan Note (Signed)
Well controlled at  

## 2012-10-11 NOTE — Assessment & Plan Note (Signed)
Check for FLP, AST, ALT

## 2012-10-12 ENCOUNTER — Encounter: Payer: Self-pay | Admitting: Internal Medicine

## 2012-10-16 ENCOUNTER — Encounter: Payer: Self-pay | Admitting: *Deleted

## 2012-10-23 ENCOUNTER — Ambulatory Visit: Payer: Medicare Other | Attending: Internal Medicine | Admitting: Rehabilitation

## 2012-10-23 DIAGNOSIS — M25559 Pain in unspecified hip: Secondary | ICD-10-CM | POA: Diagnosis not present

## 2012-10-23 DIAGNOSIS — M25519 Pain in unspecified shoulder: Secondary | ICD-10-CM | POA: Insufficient documentation

## 2012-10-23 DIAGNOSIS — IMO0001 Reserved for inherently not codable concepts without codable children: Secondary | ICD-10-CM | POA: Insufficient documentation

## 2012-10-30 ENCOUNTER — Ambulatory Visit: Payer: Medicare Other | Attending: Internal Medicine | Admitting: Rehabilitation

## 2012-10-30 DIAGNOSIS — M25559 Pain in unspecified hip: Secondary | ICD-10-CM | POA: Insufficient documentation

## 2012-10-30 DIAGNOSIS — IMO0001 Reserved for inherently not codable concepts without codable children: Secondary | ICD-10-CM | POA: Diagnosis not present

## 2012-10-30 DIAGNOSIS — M25519 Pain in unspecified shoulder: Secondary | ICD-10-CM | POA: Diagnosis not present

## 2012-11-06 ENCOUNTER — Ambulatory Visit: Payer: Medicare Other | Admitting: Rehabilitation

## 2012-11-07 ENCOUNTER — Ambulatory Visit (INDEPENDENT_AMBULATORY_CARE_PROVIDER_SITE_OTHER): Payer: Medicare Other | Admitting: Internal Medicine

## 2012-11-07 DIAGNOSIS — J449 Chronic obstructive pulmonary disease, unspecified: Secondary | ICD-10-CM | POA: Diagnosis not present

## 2012-11-07 DIAGNOSIS — J4489 Other specified chronic obstructive pulmonary disease: Secondary | ICD-10-CM

## 2012-11-07 LAB — PULMONARY FUNCTION TEST

## 2012-11-07 NOTE — Progress Notes (Signed)
PFT done today. 

## 2012-11-08 ENCOUNTER — Ambulatory Visit: Payer: Medicare Other | Admitting: Rehabilitation

## 2012-11-13 ENCOUNTER — Telehealth: Payer: Self-pay | Admitting: Internal Medicine

## 2012-11-13 NOTE — Telephone Encounter (Signed)
Advise patient, PFTs from 11/07/2012 show mild obstruction. No need for further medication at this time.

## 2012-11-14 ENCOUNTER — Encounter: Payer: Self-pay | Admitting: *Deleted

## 2012-11-14 NOTE — Telephone Encounter (Signed)
Letter mailed

## 2012-11-21 ENCOUNTER — Other Ambulatory Visit: Payer: Self-pay | Admitting: Urology

## 2012-11-21 ENCOUNTER — Ambulatory Visit
Admission: RE | Admit: 2012-11-21 | Discharge: 2012-11-21 | Disposition: A | Discharge status: Home / Self Care | Payer: Medicare Other | Source: Ambulatory Visit | Attending: Urology | Admitting: Urology

## 2012-11-21 DIAGNOSIS — C649 Malignant neoplasm of unspecified kidney, except renal pelvis: Secondary | ICD-10-CM

## 2012-11-21 DIAGNOSIS — Z85528 Personal history of other malignant neoplasm of kidney: Secondary | ICD-10-CM | POA: Diagnosis not present

## 2012-11-22 DIAGNOSIS — I10 Essential (primary) hypertension: Secondary | ICD-10-CM | POA: Diagnosis not present

## 2012-11-22 DIAGNOSIS — N184 Chronic kidney disease, stage 4 (severe): Secondary | ICD-10-CM | POA: Diagnosis not present

## 2012-11-22 DIAGNOSIS — D518 Other vitamin B12 deficiency anemias: Secondary | ICD-10-CM | POA: Diagnosis not present

## 2012-11-29 DIAGNOSIS — N390 Urinary tract infection, site not specified: Secondary | ICD-10-CM | POA: Diagnosis not present

## 2012-11-29 DIAGNOSIS — I10 Essential (primary) hypertension: Secondary | ICD-10-CM | POA: Diagnosis not present

## 2012-11-29 DIAGNOSIS — J449 Chronic obstructive pulmonary disease, unspecified: Secondary | ICD-10-CM | POA: Diagnosis not present

## 2012-12-13 ENCOUNTER — Ambulatory Visit: Payer: Medicare Other | Attending: Internal Medicine | Admitting: Rehabilitation

## 2012-12-13 DIAGNOSIS — IMO0001 Reserved for inherently not codable concepts without codable children: Secondary | ICD-10-CM | POA: Insufficient documentation

## 2012-12-13 DIAGNOSIS — M25519 Pain in unspecified shoulder: Secondary | ICD-10-CM | POA: Diagnosis not present

## 2012-12-13 DIAGNOSIS — M25559 Pain in unspecified hip: Secondary | ICD-10-CM | POA: Insufficient documentation

## 2012-12-25 ENCOUNTER — Ambulatory Visit: Payer: Medicare Other | Admitting: Rehabilitation

## 2012-12-29 DIAGNOSIS — L723 Sebaceous cyst: Secondary | ICD-10-CM | POA: Diagnosis not present

## 2013-01-01 ENCOUNTER — Ambulatory Visit: Payer: Medicare Other | Attending: Internal Medicine | Admitting: Rehabilitation

## 2013-01-01 DIAGNOSIS — M25559 Pain in unspecified hip: Secondary | ICD-10-CM | POA: Diagnosis not present

## 2013-01-01 DIAGNOSIS — IMO0001 Reserved for inherently not codable concepts without codable children: Secondary | ICD-10-CM | POA: Diagnosis not present

## 2013-01-01 DIAGNOSIS — M25519 Pain in unspecified shoulder: Secondary | ICD-10-CM | POA: Diagnosis not present

## 2013-01-12 DIAGNOSIS — D61818 Other pancytopenia: Secondary | ICD-10-CM | POA: Diagnosis not present

## 2013-01-12 DIAGNOSIS — D696 Thrombocytopenia, unspecified: Secondary | ICD-10-CM | POA: Diagnosis not present

## 2013-01-24 DIAGNOSIS — H905 Unspecified sensorineural hearing loss: Secondary | ICD-10-CM | POA: Diagnosis not present

## 2013-01-24 DIAGNOSIS — H6980 Other specified disorders of Eustachian tube, unspecified ear: Secondary | ICD-10-CM | POA: Diagnosis not present

## 2013-01-24 DIAGNOSIS — H908 Mixed conductive and sensorineural hearing loss, unspecified: Secondary | ICD-10-CM | POA: Diagnosis not present

## 2013-01-24 DIAGNOSIS — H74329 Partial loss of ear ossicles, unspecified ear: Secondary | ICD-10-CM | POA: Diagnosis not present

## 2013-02-02 ENCOUNTER — Other Ambulatory Visit: Payer: Self-pay | Admitting: Internal Medicine

## 2013-02-02 NOTE — Telephone Encounter (Signed)
Refill done.  

## 2013-03-28 DIAGNOSIS — D649 Anemia, unspecified: Secondary | ICD-10-CM | POA: Diagnosis not present

## 2013-03-28 DIAGNOSIS — D539 Nutritional anemia, unspecified: Secondary | ICD-10-CM | POA: Diagnosis not present

## 2013-03-28 DIAGNOSIS — I10 Essential (primary) hypertension: Secondary | ICD-10-CM | POA: Diagnosis not present

## 2013-03-28 DIAGNOSIS — N184 Chronic kidney disease, stage 4 (severe): Secondary | ICD-10-CM | POA: Diagnosis not present

## 2013-04-12 DIAGNOSIS — N390 Urinary tract infection, site not specified: Secondary | ICD-10-CM | POA: Diagnosis not present

## 2013-04-12 DIAGNOSIS — J449 Chronic obstructive pulmonary disease, unspecified: Secondary | ICD-10-CM | POA: Diagnosis not present

## 2013-04-12 DIAGNOSIS — I10 Essential (primary) hypertension: Secondary | ICD-10-CM | POA: Diagnosis not present

## 2013-07-30 ENCOUNTER — Ambulatory Visit: Payer: Medicare Other | Admitting: Surgery

## 2013-07-30 ENCOUNTER — Other Ambulatory Visit (INDEPENDENT_AMBULATORY_CARE_PROVIDER_SITE_OTHER): Payer: Medicare Other | Admitting: *Deleted

## 2013-07-30 ENCOUNTER — Other Ambulatory Visit: Payer: Medicare Other

## 2013-07-30 ENCOUNTER — Other Ambulatory Visit: Payer: Self-pay | Admitting: Internal Medicine

## 2013-07-30 ENCOUNTER — Ambulatory Visit: Payer: Medicare Other | Admitting: Neurosurgery

## 2013-07-30 DIAGNOSIS — I714 Abdominal aortic aneurysm, without rupture: Secondary | ICD-10-CM | POA: Diagnosis not present

## 2013-07-30 DIAGNOSIS — Z48812 Encounter for surgical aftercare following surgery on the circulatory system: Secondary | ICD-10-CM

## 2013-07-30 NOTE — Telephone Encounter (Signed)
Refill done per protocol.  

## 2013-07-31 ENCOUNTER — Other Ambulatory Visit: Payer: Self-pay | Admitting: *Deleted

## 2013-07-31 DIAGNOSIS — I714 Abdominal aortic aneurysm, without rupture: Secondary | ICD-10-CM

## 2013-07-31 DIAGNOSIS — Z48812 Encounter for surgical aftercare following surgery on the circulatory system: Secondary | ICD-10-CM

## 2013-08-01 ENCOUNTER — Encounter: Payer: Self-pay | Admitting: Surgery

## 2013-08-06 DIAGNOSIS — D539 Nutritional anemia, unspecified: Secondary | ICD-10-CM | POA: Diagnosis not present

## 2013-08-06 DIAGNOSIS — I10 Essential (primary) hypertension: Secondary | ICD-10-CM | POA: Diagnosis not present

## 2013-08-06 DIAGNOSIS — N184 Chronic kidney disease, stage 4 (severe): Secondary | ICD-10-CM | POA: Diagnosis not present

## 2013-08-06 DIAGNOSIS — D72819 Decreased white blood cell count, unspecified: Secondary | ICD-10-CM | POA: Diagnosis not present

## 2013-08-06 DIAGNOSIS — D696 Thrombocytopenia, unspecified: Secondary | ICD-10-CM | POA: Diagnosis not present

## 2013-08-06 DIAGNOSIS — D649 Anemia, unspecified: Secondary | ICD-10-CM | POA: Diagnosis not present

## 2013-08-06 DIAGNOSIS — D61818 Other pancytopenia: Secondary | ICD-10-CM | POA: Diagnosis not present

## 2013-08-08 DIAGNOSIS — D638 Anemia in other chronic diseases classified elsewhere: Secondary | ICD-10-CM | POA: Diagnosis not present

## 2013-08-08 DIAGNOSIS — D61818 Other pancytopenia: Secondary | ICD-10-CM | POA: Diagnosis not present

## 2013-08-08 DIAGNOSIS — D696 Thrombocytopenia, unspecified: Secondary | ICD-10-CM | POA: Diagnosis not present

## 2013-08-08 DIAGNOSIS — D509 Iron deficiency anemia, unspecified: Secondary | ICD-10-CM | POA: Diagnosis not present

## 2013-08-21 DIAGNOSIS — I10 Essential (primary) hypertension: Secondary | ICD-10-CM | POA: Diagnosis not present

## 2013-08-21 DIAGNOSIS — N184 Chronic kidney disease, stage 4 (severe): Secondary | ICD-10-CM | POA: Diagnosis not present

## 2013-08-28 DIAGNOSIS — I10 Essential (primary) hypertension: Secondary | ICD-10-CM | POA: Diagnosis not present

## 2013-08-28 DIAGNOSIS — N390 Urinary tract infection, site not specified: Secondary | ICD-10-CM | POA: Diagnosis not present

## 2013-10-06 DIAGNOSIS — Z23 Encounter for immunization: Secondary | ICD-10-CM | POA: Diagnosis not present

## 2013-10-11 ENCOUNTER — Telehealth: Payer: Self-pay

## 2013-10-11 NOTE — Telephone Encounter (Signed)
Medication List and allergies: done  [facility] [address] for 90 day supply  [facility] [address] for local prescriptions  Immunizations due: zostavax, flu UTD  A/P:  LAST: HM due: PSA: WNL 05/2011  CCS: UTD 12/2006  DM: Due  HTN: Due  Lipids: Due   To Discuss with Provider: Patient will be fasting

## 2013-10-15 ENCOUNTER — Ambulatory Visit (INDEPENDENT_AMBULATORY_CARE_PROVIDER_SITE_OTHER): Payer: Medicare Other | Admitting: Internal Medicine

## 2013-10-15 ENCOUNTER — Encounter: Payer: Self-pay | Admitting: Internal Medicine

## 2013-10-15 VITALS — BP 150/70 | HR 65 | Temp 98.5°F | Wt 157.0 lb

## 2013-10-15 DIAGNOSIS — J449 Chronic obstructive pulmonary disease, unspecified: Secondary | ICD-10-CM | POA: Diagnosis not present

## 2013-10-15 DIAGNOSIS — I714 Abdominal aortic aneurysm, without rupture: Secondary | ICD-10-CM

## 2013-10-15 DIAGNOSIS — Z0000 Encounter for general adult medical examination without abnormal findings: Secondary | ICD-10-CM | POA: Diagnosis not present

## 2013-10-15 DIAGNOSIS — E7800 Pure hypercholesterolemia, unspecified: Secondary | ICD-10-CM | POA: Diagnosis not present

## 2013-10-15 DIAGNOSIS — I10 Essential (primary) hypertension: Secondary | ICD-10-CM | POA: Diagnosis not present

## 2013-10-15 DIAGNOSIS — C649 Malignant neoplasm of unspecified kidney, except renal pelvis: Secondary | ICD-10-CM | POA: Diagnosis not present

## 2013-10-15 DIAGNOSIS — N259 Disorder resulting from impaired renal tubular function, unspecified: Secondary | ICD-10-CM

## 2013-10-15 LAB — LIPID PANEL: Cholesterol: 196 mg/dL (ref 0–200)

## 2013-10-15 LAB — TSH: TSH: 2.26 u[IU]/mL (ref 0.35–5.50)

## 2013-10-15 NOTE — Assessment & Plan Note (Addendum)
Good medication compliance, BP today slightly elevated. Recent BMP and nephrology reportedly stable. Ambulatory BPs consistently 140/70 Plan: No change, continue checking ambulatory BPs, followup in one year

## 2013-10-15 NOTE — Assessment & Plan Note (Signed)
AAA, status post repair. Followup by vascular, ultrasound 07-2013 stable.

## 2013-10-15 NOTE — Assessment & Plan Note (Addendum)
Recently saw nephrology, was told kidney fx was stable

## 2013-10-15 NOTE — Assessment & Plan Note (Signed)
To see urology soon

## 2013-10-15 NOTE — Progress Notes (Signed)
  Subjective:    Patient ID: [name], male    DOB: [birth year], 79 y.o.   MRN: [id number]  HPI Here w/ daughter  1. Risk factors based on Past M, S, F history: reviewed 2. Physical Activities:  Active at home, walks 2 miles a the gym 3. Depression/mood:  Neg screening   4. Hearing:  Uses hearing aids 5. ADL's:  Independent except for driving 6. Fall Risk: prevention discussed   7. home Safety: does feel safe at home   8. Height, weight, &visual acuity: see VS, vision ok sees eye doctor yearly 9. Counseling: provided 10. Labs ordered based on risk factors: if needed   11. Referral Coordination: if needed 12.  Care Plan, see assessment and plan   13.   Cognitive Assessment: Motor skills and cognition appropriate for age  In addition, today we discussed the following: Aortic aneurysm repair, ultrasound in 2 months ago stable. History renal cell carcinoma, to see urology next month History of Anemia, saw hematology recently, no records, hemoglobin was reportedly around 13. History renal insufficiency, several nephrology last month, no records, was told renal fx  stable. Patient's daughter is concerned about a "knot" in the left leg, see physical exam Has a chronic cough, for years, dry, not bothersome, see ROS   Past Medical History: Bleeding Ulcer-1997 Abdominal aortic aneurysm Dx 10-09---> s/p endovascular repair and angiogram 10-04-08 carotid u/s <30% B (10-09) RENAL CELL CARCINOMA, CLEAR CELL TYPE   Dx 10-09, s/p excision 10-24-08 Hypertension Renal insufficiency Anemia,  EGD Cscope 08-2012 COPD   history of leukopenia thought to be from doxycycline (2-11)  Past Surgical History: 1997- Lip Cancer Removed 2004-Skin Cancer Removed Hemorrhoidectomy (0865'H) RENAL CELL CARCINOMA, CLEAR CELL TYPE   Dx 10-09, s/p excision 10-24-08  Social History: Married, wife w/ advance dementia (bed rriden) , daughter July ([phone]) very involved in his care totally independent on ADL,   Not driving   quit tobacco 8469 Alcohol use-no  Family History: DM-- F? Stroke-- + HTN-- + colon ca-- no prostate ca--no   Review of Systems  No  CP, SOB, lower extremity edema Denies  nausea, vomiting diarrhea Denies  blood in the stools (-) wheezing, + chest congestion last week, s/p mucinex now better (-) GERD sx  No dysuria, gross hematuria, difficulty urinating   No claudication          Objective:   Physical Exam  Musculoskeletal:       Legs:  BP 150/70  Pulse 65  Temp(Src) 98.5 F (36.9 C)  Wt 157 lb (71.215 kg)  BMI 23.52 kg/m2  SpO2 98% General -- alert, well-developed, NAD.  Neck --no  LAD HEENT-- Not pale. Face symmetric, sinuses not tender to palpation. Nose not congested.  Lungs -- normal respiratory effort, no intercostal retractions, no accessory muscle use, and normal breath sounds.  Heart-- normal rate, regular rhythm, no murmur.  Abdomen-- Not distended, good bowel sounds,soft, non-tender. + bruit Extremities-- trace  pretibial edema bilaterally  Neurologic--  alert & oriented X3. Speech normal, gait appropriate for age, strength normal in all extremities.   Psych--  No anxious appearing , no depressed appearing.      Assessment & Plan:

## 2013-10-15 NOTE — Patient Instructions (Signed)
Get your blood work before you leave  Next visit in 1 year  for a physical exam    Fall Prevention and Home Safety Falls cause injuries and can affect all age groups. It is possible to use preventive measures to significantly decrease the likelihood of falls. There are many simple measures which can make your home safer and prevent falls. OUTDOORS  Repair cracks and edges of walkways and driveways.  Remove high doorway thresholds.  Trim shrubbery on the main path into your home.  Have good outside lighting.  Clear walkways of tools, rocks, debris, and clutter.  Check that handrails are not broken and are securely fastened. Both sides of steps should have handrails.  Have leaves, snow, and ice cleared regularly.  Use sand or salt on walkways during winter months.  In the garage, clean up grease or oil spills. BATHROOM  Install night lights.  Install grab bars by the toilet and in the tub and shower.  Use non-skid mats or decals in the tub or shower.  Place a plastic non-slip stool in the shower to sit on, if needed.  Keep floors dry and clean up all water on the floor immediately.  Remove soap buildup in the tub or shower on a regular basis.  Secure bath mats with non-slip, double-sided rug tape.  Remove throw rugs and tripping hazards from the floors. BEDROOMS  Install night lights.  Make sure a bedside light is easy to reach.  Do not use oversized bedding.  Keep a telephone by your bedside.  Have a firm chair with side arms to use for getting dressed.  Remove throw rugs and tripping hazards from the floor. KITCHEN  Keep handles on pots and pans turned toward the center of the stove. Use back burners when possible.  Clean up spills quickly and allow time for drying.  Avoid walking on wet floors.  Avoid hot utensils and knives.  Position shelves so they are not too high or low.  Place commonly used objects within easy reach.  If necessary, use a  sturdy step stool with a grab bar when reaching.  Keep electrical cables out of the way.  Do not use floor polish or wax that makes floors slippery. If you must use wax, use non-skid floor wax.  Remove throw rugs and tripping hazards from the floor. STAIRWAYS  Never leave objects on stairs.  Place handrails on both sides of stairways and use them. Fix any loose handrails. Make sure handrails on both sides of the stairways are as long as the stairs.  Check carpeting to make sure it is firmly attached along stairs. Make repairs to worn or loose carpet promptly.  Avoid placing throw rugs at the top or bottom of stairways, or properly secure the rug with carpet tape to prevent slippage. Get rid of throw rugs, if possible.  Have an electrician put in a light switch at the top and bottom of the stairs. OTHER FALL PREVENTION TIPS  Wear low-heel or rubber-soled shoes that are supportive and fit well. Wear closed toe shoes.  When using a stepladder, make sure it is fully opened and both spreaders are firmly locked. Do not climb a closed stepladder.  Add color or contrast paint or tape to grab bars and handrails in your home. Place contrasting color strips on first and last steps.  Learn and use mobility aids as needed. Install an electrical emergency response system.  Turn on lights to avoid dark areas. Replace light bulbs that  burn out immediately. Get light switches that glow.  Arrange furniture to create clear pathways. Keep furniture in the same place.  Firmly attach carpet with non-skid or double-sided tape.  Eliminate uneven floor surfaces.  Select a carpet pattern that does not visually hide the edge of steps.  Be aware of all pets. OTHER HOME SAFETY TIPS  Set the water temperature for 120 F (48.8 C).  Keep emergency numbers on or near the telephone.  Keep smoke detectors on every level of the home and near sleeping areas. Document Released: 12/03/2002 Document Revised:  06/13/2012 Document Reviewed: 03/03/2012 [facility] Patient Information 2014 [place], [place].

## 2013-10-15 NOTE — Assessment & Plan Note (Signed)
Asymptomatic except for chronic, mild cough. PFTs  8- 2013 mild obstruction otherwise normal. Plan: No change

## 2013-10-15 NOTE — Assessment & Plan Note (Signed)
Check labs 

## 2013-10-15 NOTE — Assessment & Plan Note (Addendum)
Td  2011 Had a Flu shot  pneumonia shot  2007 and 09-2012 shingles shot Rx was provided , has not used   most labs done elsewhere To see urology soon  colonoscopy 05/2007 negative except for tics, Cscope again for anemia 09-11-12:same result  EGD 09-11-12-- gastritis   Diet exercise discussed

## 2013-11-20 ENCOUNTER — Other Ambulatory Visit: Payer: Self-pay | Admitting: Urology

## 2013-11-20 ENCOUNTER — Ambulatory Visit
Admission: RE | Admit: 2013-11-20 | Discharge: 2013-11-20 | Disposition: A | Discharge status: Home / Self Care | Payer: Medicare Other | Source: Ambulatory Visit | Attending: Urology | Admitting: Urology

## 2013-11-20 DIAGNOSIS — J9819 Other pulmonary collapse: Secondary | ICD-10-CM | POA: Diagnosis not present

## 2013-11-20 DIAGNOSIS — C649 Malignant neoplasm of unspecified kidney, except renal pelvis: Secondary | ICD-10-CM

## 2013-11-20 DIAGNOSIS — Z85528 Personal history of other malignant neoplasm of kidney: Secondary | ICD-10-CM | POA: Diagnosis not present

## 2013-11-21 ENCOUNTER — Encounter: Payer: Self-pay | Admitting: Internal Medicine

## 2013-11-21 ENCOUNTER — Ambulatory Visit (INDEPENDENT_AMBULATORY_CARE_PROVIDER_SITE_OTHER): Payer: Medicare Other | Admitting: Internal Medicine

## 2013-11-21 VITALS — BP 120/57 | HR 90 | Temp 97.8°F | Wt 160.0 lb

## 2013-11-21 DIAGNOSIS — J069 Acute upper respiratory infection, unspecified: Secondary | ICD-10-CM | POA: Diagnosis not present

## 2013-11-21 DIAGNOSIS — R42 Dizziness and giddiness: Secondary | ICD-10-CM

## 2013-11-21 MED ORDER — AMOXICILLIN 500 MG PO CAPS: 1000.0000 mg | ORAL_CAPSULE | Freq: Two times a day (BID) | ORAL | Status: DC

## 2013-11-21 NOTE — Progress Notes (Signed)
   Subjective:    Patient ID: [name], male    DOB: 1927/04/29, 79 y.o.   MRN: [id number]  HPI Acute visit, here w/  Daughter  Last 2 days he has noted some dizziness, symptoms are immediately after he stands up, last 5 or 6 seconds, and as he starts walking he feels better. Also over the last few days is having some nasal congestion and mild cough. He is taking Mucinex otherwise no new medicines.   Past Medical History: Bleeding Ulcer-1997 Abdominal aortic aneurysm Dx 10-09---> s/p endovascular repair and angiogram 10-04-08 carotid u/s <30% B (10-09) RENAL CELL CARCINOMA, CLEAR CELL TYPE   Dx 10-09, s/p excision 10-24-08 Hypertension Renal insufficiency Anemia,  EGD Cscope 08-2012 COPD   history of leukopenia thought to be from doxycycline (2-11)  Past Surgical History: 1997- Lip Cancer Removed 2004-Skin Cancer Removed Hemorrhoidectomy (6578'I) RENAL CELL CARCINOMA, CLEAR CELL TYPE   Dx 10-09, s/p excision 10-24-08  Social History: Married, wife w/ advance dementia (bed rriden) , daughter July ([phone]) very involved in his care totally independent on ADL,  Not driving   quit tobacco 6962 Alcohol use-no  Family History: DM-- F? Stroke-- + HTN-- + colon ca-- no prostate ca--no  Review of Systems Went to see the urologist yesterday, had a low grade fever, mild chills yesterday as well.(UA was collected, results?) Denies chest pain, shortness of breath. No palpitations. No abdominal pain. Denies slurred speech, face numbness, motor deficits, blurred vision? Diplopia? No headaches or head injuries    Objective:   Physical Exam BP 120/57  Pulse 90  Temp(Src) 97.8 F (36.6 C)  Wt 160 lb (72.576 kg)  SpO2 97% General -- alert, well-developed, NAD.  Neck --no thyromegaly , normal carotid pulse  HEENT-- Not pale.   Lungs -- normal respiratory effort, no intercostal retractions, no accessory muscle use, and normal breath sounds.  Heart-- normal rate, regular  rhythm, no murmur.  Extremities-- no pretibial edema bilaterally  Neurologic--  alert & oriented X3. Speech normal, gait appropriate for age, able to turn and maneuver through the clinic,  strength normal in all extremities.  DTRs symmetric  EOMI   Psych-- Cognition and judgment appear intact. Cooperative with normal attention span and concentration. No anxious appearing , no depressed appearing.     Assessment & Plan:   Dizziness and URI, Dizziness is consistent with a peripheral issue, neurological exam normal for age. We discussed possibly doing an MRI as a way to be more certain about a benign etiology but we agreed for observation w/ prompt re- eval if symptoms increase or are different. Will treat date URI conservatively,abx if no better  see instructions.

## 2013-11-21 NOTE — Patient Instructions (Addendum)
Rest, fluids , tylenol For cough or chest congestion: take Mucinex DM twice a day as needed  For congestion use OTC Nasocort nasal spray: 2 sprays on each side of the nose daily until better If cough continue, start amoxicillin in 3-4 days. Call if no better in few days Call anytime if the symptoms are severe, you are not better in few days or if you have any stroke symptoms

## 2013-11-21 NOTE — Progress Notes (Signed)
Pre visit review using our clinic review tool, if applicable. No additional management support is needed unless otherwise documented below in the visit note. 

## 2013-12-03 DIAGNOSIS — L821 Other seborrheic keratosis: Secondary | ICD-10-CM | POA: Diagnosis not present

## 2013-12-24 DIAGNOSIS — I10 Essential (primary) hypertension: Secondary | ICD-10-CM | POA: Diagnosis not present

## 2013-12-24 DIAGNOSIS — D631 Anemia in chronic kidney disease: Secondary | ICD-10-CM | POA: Diagnosis not present

## 2013-12-31 DIAGNOSIS — N390 Urinary tract infection, site not specified: Secondary | ICD-10-CM | POA: Diagnosis not present

## 2013-12-31 DIAGNOSIS — I10 Essential (primary) hypertension: Secondary | ICD-10-CM | POA: Diagnosis not present

## 2013-12-31 DIAGNOSIS — N183 Chronic kidney disease, stage 3 unspecified: Secondary | ICD-10-CM | POA: Diagnosis not present

## 2014-01-02 DIAGNOSIS — N183 Chronic kidney disease, stage 3 unspecified: Secondary | ICD-10-CM | POA: Diagnosis not present

## 2014-01-02 DIAGNOSIS — I10 Essential (primary) hypertension: Secondary | ICD-10-CM | POA: Diagnosis not present

## 2014-01-02 DIAGNOSIS — D631 Anemia in chronic kidney disease: Secondary | ICD-10-CM | POA: Diagnosis not present

## 2014-01-10 ENCOUNTER — Ambulatory Visit (INDEPENDENT_AMBULATORY_CARE_PROVIDER_SITE_OTHER): Payer: Medicare Other | Admitting: Family Medicine

## 2014-01-10 ENCOUNTER — Encounter: Payer: Self-pay | Admitting: Family Medicine

## 2014-01-10 VITALS — BP 132/78 | HR 64 | Temp 98.2°F | Resp 16 | Wt 162.1 lb

## 2014-01-10 DIAGNOSIS — J441 Chronic obstructive pulmonary disease with (acute) exacerbation: Secondary | ICD-10-CM | POA: Diagnosis not present

## 2014-01-10 MED ORDER — AMOXICILLIN 875 MG PO TABS: 875.0000 mg | ORAL_TABLET | Freq: Two times a day (BID) | ORAL | Status: DC

## 2014-01-10 NOTE — Assessment & Plan Note (Addendum)
New.  Start abx due to abnormal BS on L.  OTC cough meds prn.  Reviewed supportive care and red flags that should prompt return.  Pt expressed understanding and is in agreement w/ plan.

## 2014-01-10 NOTE — Progress Notes (Signed)
   Subjective:    Patient ID: [name], male    DOB: [birth year], 80 y.o.   MRN: [id number]  HPI URI- nasal congestion, chest congestion, head congestion.  + cough.  sxs started ~1 week ago.  No fevers.  No sinus pain/pressure.  No ear pain.  Cough is intermittently productive.  + sick contacts.  No N/V/D.  Hx of COPD on problem list but not currently on inhalers.   Review of Systems For ROS see HPI     Objective:   Physical Exam  Vitals reviewed. Constitutional: He appears well-developed and well-nourished. No distress.  HENT:  Head: Normocephalic and atraumatic.  Right Ear: Tympanic membrane normal.  Left Ear: Tympanic membrane normal.  Nose: No mucosal edema or rhinorrhea. Right sinus exhibits no maxillary sinus tenderness and no frontal sinus tenderness. Left sinus exhibits no maxillary sinus tenderness and no frontal sinus tenderness.  Mouth/Throat: Mucous membranes are normal. No oropharyngeal exudate, posterior oropharyngeal edema or posterior oropharyngeal erythema.  Eyes: Conjunctivae and EOM are normal. Pupils are equal, round, and reactive to light.  Neck: Normal range of motion. Neck supple.  Cardiovascular: Normal rate, regular rhythm and normal heart sounds.   Pulmonary/Chest: Effort normal. No respiratory distress. He has no wheezes.  + hacking cough Coarse BS on L w/o obvious crackles  Lymphadenopathy:    He has no cervical adenopathy.  Skin: Skin is warm and dry.          Assessment & Plan:

## 2014-01-10 NOTE — Progress Notes (Signed)
Pre visit review using our clinic review tool, if applicable. No additional management support is needed unless otherwise documented below in the visit note. 

## 2014-01-10 NOTE — Patient Instructions (Signed)
Follow up as needed Start the Amoxicillin twice daily for upper respiratory infection Use Delsym as needed for cough Coricidin HBP (over the counter) for nasal congestion Drink plenty of fluids REST! Hang in there!

## 2014-01-11 ENCOUNTER — Telehealth: Payer: Self-pay | Admitting: Internal Medicine

## 2014-01-11 NOTE — Telephone Encounter (Signed)
Relevant patient education assigned to patient using Emmi. ° °

## 2014-01-18 ENCOUNTER — Other Ambulatory Visit: Payer: Self-pay | Admitting: Internal Medicine

## 2014-01-21 NOTE — Telephone Encounter (Signed)
Omeprazole refilled per protocol. JG//CMA

## 2014-01-28 DIAGNOSIS — D638 Anemia in other chronic diseases classified elsewhere: Secondary | ICD-10-CM | POA: Diagnosis not present

## 2014-01-28 DIAGNOSIS — D509 Iron deficiency anemia, unspecified: Secondary | ICD-10-CM | POA: Diagnosis not present

## 2014-01-28 DIAGNOSIS — D72819 Decreased white blood cell count, unspecified: Secondary | ICD-10-CM | POA: Diagnosis not present

## 2014-01-28 DIAGNOSIS — D696 Thrombocytopenia, unspecified: Secondary | ICD-10-CM | POA: Diagnosis not present

## 2014-02-15 DIAGNOSIS — H18459 Nodular corneal degeneration, unspecified eye: Secondary | ICD-10-CM | POA: Diagnosis not present

## 2014-03-27 DIAGNOSIS — H74329 Partial loss of ear ossicles, unspecified ear: Secondary | ICD-10-CM | POA: Diagnosis not present

## 2014-03-27 DIAGNOSIS — H6980 Other specified disorders of Eustachian tube, unspecified ear: Secondary | ICD-10-CM | POA: Diagnosis not present

## 2014-03-27 DIAGNOSIS — H908 Mixed conductive and sensorineural hearing loss, unspecified: Secondary | ICD-10-CM | POA: Diagnosis not present

## 2014-06-24 DIAGNOSIS — N039 Chronic nephritic syndrome with unspecified morphologic changes: Secondary | ICD-10-CM | POA: Diagnosis not present

## 2014-06-24 DIAGNOSIS — D631 Anemia in chronic kidney disease: Secondary | ICD-10-CM | POA: Diagnosis not present

## 2014-06-24 DIAGNOSIS — N183 Chronic kidney disease, stage 3 unspecified: Secondary | ICD-10-CM | POA: Diagnosis not present

## 2014-06-24 DIAGNOSIS — E559 Vitamin D deficiency, unspecified: Secondary | ICD-10-CM | POA: Diagnosis not present

## 2014-06-24 DIAGNOSIS — I10 Essential (primary) hypertension: Secondary | ICD-10-CM | POA: Diagnosis not present

## 2014-07-01 DIAGNOSIS — N390 Urinary tract infection, site not specified: Secondary | ICD-10-CM | POA: Diagnosis not present

## 2014-07-01 DIAGNOSIS — I10 Essential (primary) hypertension: Secondary | ICD-10-CM | POA: Diagnosis not present

## 2014-07-01 DIAGNOSIS — N183 Chronic kidney disease, stage 3 unspecified: Secondary | ICD-10-CM | POA: Diagnosis not present

## 2014-07-16 ENCOUNTER — Other Ambulatory Visit: Payer: Self-pay | Admitting: Internal Medicine

## 2014-08-02 ENCOUNTER — Encounter: Payer: Self-pay | Admitting: Family

## 2014-08-05 ENCOUNTER — Other Ambulatory Visit (HOSPITAL_COMMUNITY): Payer: Medicare Other

## 2014-08-05 ENCOUNTER — Encounter: Payer: Self-pay | Admitting: Family

## 2014-08-05 ENCOUNTER — Ambulatory Visit (HOSPITAL_COMMUNITY)
Admission: RE | Admit: 2014-08-05 | Discharge: 2014-08-05 | Disposition: A | Discharge status: Home / Self Care | Payer: Medicare Other | Source: Ambulatory Visit | Attending: Family | Admitting: Family

## 2014-08-05 ENCOUNTER — Ambulatory Visit (INDEPENDENT_AMBULATORY_CARE_PROVIDER_SITE_OTHER): Payer: Medicare Other | Admitting: Family

## 2014-08-05 ENCOUNTER — Ambulatory Visit: Payer: Medicare Other | Admitting: Family

## 2014-08-05 VITALS — BP 169/78 | HR 54 | Resp 14 | Ht 69.0 in | Wt 160.0 lb

## 2014-08-05 DIAGNOSIS — Z48812 Encounter for surgical aftercare following surgery on the circulatory system: Secondary | ICD-10-CM

## 2014-08-05 DIAGNOSIS — I714 Abdominal aortic aneurysm, without rupture, unspecified: Secondary | ICD-10-CM | POA: Diagnosis not present

## 2014-08-05 DIAGNOSIS — N183 Chronic kidney disease, stage 3 unspecified: Secondary | ICD-10-CM | POA: Diagnosis not present

## 2014-08-05 DIAGNOSIS — E109 Type 1 diabetes mellitus without complications: Secondary | ICD-10-CM | POA: Diagnosis not present

## 2014-08-05 NOTE — Patient Instructions (Signed)
Abdominal Aortic Aneurysm An aneurysm is a weakened or damaged part of an artery wall that bulges from the normal force of blood pumping through the body. An abdominal aortic aneurysm is an aneurysm that occurs in the lower part of the aorta, the main artery of the body.  The major concern with an abdominal aortic aneurysm is that it can enlarge and burst (rupture) or blood can flow between the layers of the wall of the aorta through a tear (aorticdissection). Both of these conditions can cause bleeding inside the body and can be life threatening unless diagnosed and treated promptly. CAUSES  The exact cause of an abdominal aortic aneurysm is unknown. Some contributing factors are:   A hardening of the arteries caused by the buildup of fat and other substances in the lining of a blood vessel (arteriosclerosis).  Inflammation of the walls of an artery (arteritis).   Connective tissue diseases, such as Marfan syndrome.   Abdominal trauma.   An infection, such as syphilis or staphylococcus, in the wall of the aorta (infectious aortitis) caused by bacteria. RISK FACTORS  Risk factors that contribute to an abdominal aortic aneurysm may include:  Age older than 60 years.   High blood pressure (hypertension).  Male gender.  Ethnicity (white race).  Obesity.  Family history of aneurysm (first degree relatives only).  Tobacco use. PREVENTION  The following healthy lifestyle habits may help decrease your risk of abdominal aortic aneurysm:  Quitting smoking. Smoking can raise your blood pressure and cause arteriosclerosis.  Limiting or avoiding alcohol.  Keeping your blood pressure, blood sugar level, and cholesterol levels within normal limits.  Decreasing your salt intake. In somepeople, too much salt can raise blood pressure and increase your risk of abdominal aortic aneurysm.  Eating a diet low in saturated fats and cholesterol.  Increasing your fiber intake by including  whole grains, vegetables, and fruits in your diet. Eating these foods may help lower blood pressure.  Maintaining a healthy weight.  Staying physically active and exercising regularly. SYMPTOMS  The symptoms of abdominal aortic aneurysm may vary depending on the size and rate of growth of the aneurysm.Most grow slowly and do not have any symptoms. When symptoms do occur, they may include:  Pain (abdomen, side, lower back, or groin). The pain may vary in intensity. A sudden onset of severe pain may indicate that the aneurysm has ruptured.  Feeling full after eating only small amounts of food.  Nausea or vomiting or both.  Feeling a pulsating lump in the abdomen.  Feeling faint or passing out. DIAGNOSIS  Since most unruptured abdominal aortic aneurysms have no symptoms, they are often discovered during diagnostic exams for other conditions. An aneurysm may be found during the following procedures:  Ultrasonography (A one-time screening for abdominal aortic aneurysm by ultrasonography is also recommended for all men aged 65-75 years who have ever smoked).  X-ray exams.  A computed tomography (CT).  Magnetic resonance imaging (MRI).  Angiography or arteriography. TREATMENT  Treatment of an abdominal aortic aneurysm depends on the size of your aneurysm, your age, and risk factors for rupture. Medication to control blood pressure and pain may be used to manage aneurysms smaller than 6 cm. Regular monitoring for enlargement may be recommended by your caregiver if:  The aneurysm is 3-4 cm in size (an annual ultrasonography may be recommended).  The aneurysm is 4-4.5 cm in size (an ultrasonography every 6 months may be recommended).  The aneurysm is larger than 4.5 cm in   size (your caregiver may ask that you be examined by a vascular surgeon). If your aneurysm is larger than 6 cm, surgical repair may be recommended. There are two main methods for repair of an aneurysm:   Endovascular  repair (a minimally invasive surgery). This is done most often.  Open repair. This method is used if an endovascular repair is not possible. Document Released: 09/22/2005 Document Revised: 04/09/2013 Document Reviewed: 01/12/2013 Exit[employer] Patient Information 2015 [employer]. This information is not intended to replace advice given to you by your health care provider. Make sure you discuss any questions you have with your health care provider.  

## 2014-08-05 NOTE — Progress Notes (Signed)
[facility] [place]  Established [facility]  History of Present Illness  [name] is a 80 y.o. (1927-02-10) male patient of Dr. Trula Slade who is status post endovascular aneurysm repair on 10/04/2008. Subsequent to that he has undergone right nephrectomy secondary to a right renal cell tumor. He is down to approximately 20% function of his solitary kidney. For that reason Dr. Trula Slade been following him with ultrasound and noncontrast images. He has been doing very well. Recently, however he has noticed darker colored stool with anemia. He has been referred to a gastroenterologist and is scheduled for colonoscopy and upper endoscopy.  The original size of his aneurysm was 5.8 cm.  Most recent EVAR duplex (Date: 07/30/13) demonstrates: no endoleak and 5.1 cm sac size.    Most recent CTA (Date: 12/07/10) demonstrates:  The AAA stent remains unchanged in position. No IV contrast could  be given and therefore the presence or absence of endovascular  stent is more difficult to determine. The native AAA sac size is  stable measuring 61 x 48 mm compared to 61 x 47 mm previously.  The iliac limbs of the AAA stent remain unchanged in position. The  urinary bladder is unremarkable. The prostate is prominent and  stable. No pelvic mass or fluid is seen. The terminal ileum and  appendix are unremarkable. There are degenerative changes  involving the facet joints of the lower lumbar spine.  IMPRESSION:  1. Stable position of AAA stent.  2. Stable size of native AAA sac.   The patient has not had back or abdominal pain. Daughter states his systolic blood pressure is usually in the 140's, she also states that his renal cell carcinoma has remained relatively stable. He walks 2 miles on a treadmill, 5-6 days/week.   Pt Diabetic: No Pt smoker: former smoker, quit in 2009   Past Medical History  Diagnosis Date  . Abdominal aortic aneurysm 09/2008    s/o endovascular repair and  angiogram 10/04/08   . Bleeding ulcer 1997  . Leukopenia 2/11    thought to be from doxy  . Renal cell carcinoma     clear cell type dx 10/09, s/p excision 10-24-08  . Hypertension   . Renal insufficiency   . COPD (chronic obstructive pulmonary disease)    Past Surgical History  Procedure Laterality Date  . Mohs surgery      1997 (lip), 2004  . Hemorrhoid surgery  1990  . Nephrectomy  2009    Open nephrectomy for cancer  . Abdominal aortic aneurysm repair  2009    EVAR   Social History History  Substance Use Topics  . Smoking status: Former Smoker    Quit date: 12/28/2007  . Smokeless tobacco: Never Used     Comment: used to smoke 1.5 ppd  . Alcohol Use: No   Family History Family History  Problem Relation Age of Onset  . Diabetes Father     ?  . Stroke Father   . Hypertension Father   . Colon cancer Neg Hx   . Prostate cancer Neg Hx    Current Outpatient Prescriptions on File Prior to Visit  Medication Sig Dispense Refill  . amLODipine (NORVASC) 10 MG tablet Take 10 mg by mouth daily.      Sarajane Marek Sodium 30-100 MG CAPS Take by mouth.        . ergocalciferol (VITAMIN D2) 50000 UNITS capsule Take 50,000 Units by mouth every 30 (thirty) days.      Marland Kitchen  FeFum-FePo-FA-B Cmp-C-Zn-Mn-Cu (SE-TAN PLUS) 162-115.2-1 MG CAPS Take 1 tablet by mouth daily. Take 1 Tab  daily      . Multiple Vitamins-Iron (MULTIVITAMIN/IRON PO) Take by mouth.        Marland Kitchen omeprazole (PRILOSEC) 20 MG capsule TAKE ONE CAPSULE BY MOUTH ONCE DAILY  90 capsule  0  . torsemide (DEMADEX) 10 MG tablet Take 5 mg by mouth every other day.       Marland Kitchen amoxicillin (AMOXIL) 875 MG tablet Take 1 tablet (875 mg total) by mouth 2 (two) times daily.  20 tablet  0   No current facility-administered medications on file prior to visit.   No Known Allergies   ROS: See HPI for pertinent positives and negatives.  Physical Examination  Filed Vitals:   08/05/14 0942  BP: 169/78  Pulse: 54  Resp: 14   Height: 5\' 9"  (1.753 m)  Weight: 160 lb (72.576 kg)  SpO2: 98%   Body mass index is 23.62 kg/(m^2).  General: A&O x 3, WD, quiet.  Pulmonary: Sym exp, good air movt, CTAB, no rales, rhonchi, or wheezing.  Cardiac: RRR, Nl S1, S2, no Murmurs appreciated  Vascular: Vessel Right Left  Radial 2+Palpable 2+Palpable  Carotid  palpable without bruit  palpable without bruit  Aorta Not palpable N/A  Femoral 3+Palpable 3+Palpable  Popliteal 2+ palpable 2+ palpable  PT 2+Palpable 2+Palpable  DP 2+Palpable 2+Palpable   Gastrointestinal: soft, NTND, -G/R, - HSM, - masses, - CVAT B.  Musculoskeletal: M/S 5/5 throughout, Extremities without ischemic changes.  Neurologic: Pain and light touch intact in extremities, Motor exam as listed above.  Non-Invasive Vascular Imaging  EVAR Duplex (Date: 08/05/2014) ABDOMINAL AORTA DUPLEX EVALUATION - POST ENDOVASCULAR REPAIR    INDICATION: Abdominal Aortic Aneuyrsm    PREVIOUS INTERVENTION(S): Endovascular Aortic Repair placed 10/04/2008    DUPLEX EXAM:      DIAMETER AP (cm) DIAMETER TRANSVERSE (cm) VELOCITIES (cm/sec)  Aorta 5.43 5.39 34  Right Common Iliac 1.47 1.52 75  Left Common Iliac 1.31 1.28 142    Comparison Study       Date DIAMETER AP (cm) DIAMETER TRANSVERSE (cm)  07/30/2013 4.73 5.14     ADDITIONAL FINDINGS:     IMPRESSION: Abdominal aortic sac presents with minimal increased diameters measuring 5.4 cm AP x 5.3cm TRV. Patent iliac arteries at the distal Endovascular Aortic Repair anastomoses.    Compared to the previous exam:  Minimal increase in abdominal aortic sac since previous study on 07/30/2013.      Medical Decision Making  [name] is a 80 y.o. male who presents s/p EVAR (Date: 10/04/2008).  Pt is asymptomatic with minimal increase in sac size.  I discussed with the patient the importance of surveillance of the endograft.  Based on today's abdominal aortic Duplex and exam , and after discussing with  Dr. Trula Slade, will schedule non contrast CT angiogram abdomen/pelvis in 6 months and follow up with Dr. Trula Slade. Non Contrast CTA due to the diminished function of his one remaining kidney.  The patient will follow up with [place] in 6 months with these studies.  I emphasized the importance of maximal medical management including strict control of blood pressure, blood glucose, and lipid levels, antiplatelet agents, obtaining regular exercise, and continued cessation of smoking.   The patient was given information about AAA including signs, symptoms, treatment, and how to minimize the risk of enlargement and rupture of aneurysms.    Thank you for allowing [place] to participate in this patient's care.  Clemon Chambers, RN, MSN, FNP-C [facility] Office: [facility] Physician: Trula Slade  08/05/2014, 9:39 AM

## 2014-08-13 DIAGNOSIS — D631 Anemia in chronic kidney disease: Secondary | ICD-10-CM | POA: Diagnosis not present

## 2014-08-13 DIAGNOSIS — N184 Chronic kidney disease, stage 4 (severe): Secondary | ICD-10-CM | POA: Diagnosis not present

## 2014-08-13 DIAGNOSIS — I10 Essential (primary) hypertension: Secondary | ICD-10-CM | POA: Diagnosis not present

## 2014-08-13 DIAGNOSIS — N390 Urinary tract infection, site not specified: Secondary | ICD-10-CM | POA: Diagnosis not present

## 2014-08-14 DIAGNOSIS — H908 Mixed conductive and sensorineural hearing loss, unspecified: Secondary | ICD-10-CM | POA: Diagnosis not present

## 2014-08-14 DIAGNOSIS — H6980 Other specified disorders of Eustachian tube, unspecified ear: Secondary | ICD-10-CM | POA: Diagnosis not present

## 2014-08-14 DIAGNOSIS — H74329 Partial loss of ear ossicles, unspecified ear: Secondary | ICD-10-CM | POA: Diagnosis not present

## 2014-08-19 DIAGNOSIS — D638 Anemia in other chronic diseases classified elsewhere: Secondary | ICD-10-CM | POA: Diagnosis not present

## 2014-10-08 ENCOUNTER — Other Ambulatory Visit: Payer: Self-pay | Admitting: Internal Medicine

## 2014-10-17 ENCOUNTER — Encounter: Payer: Self-pay | Admitting: Internal Medicine

## 2014-10-17 ENCOUNTER — Ambulatory Visit (INDEPENDENT_AMBULATORY_CARE_PROVIDER_SITE_OTHER): Payer: Medicare Other | Admitting: Internal Medicine

## 2014-10-17 VITALS — BP 134/64 | HR 62 | Temp 97.9°F | Ht 68.0 in | Wt 164.2 lb

## 2014-10-17 DIAGNOSIS — Z0000 Encounter for general adult medical examination without abnormal findings: Secondary | ICD-10-CM | POA: Diagnosis not present

## 2014-10-17 DIAGNOSIS — I10 Essential (primary) hypertension: Secondary | ICD-10-CM

## 2014-10-17 DIAGNOSIS — E7800 Pure hypercholesterolemia, unspecified: Secondary | ICD-10-CM

## 2014-10-17 DIAGNOSIS — N189 Chronic kidney disease, unspecified: Secondary | ICD-10-CM

## 2014-10-17 DIAGNOSIS — D649 Anemia, unspecified: Secondary | ICD-10-CM

## 2014-10-17 DIAGNOSIS — J449 Chronic obstructive pulmonary disease, unspecified: Secondary | ICD-10-CM

## 2014-10-17 DIAGNOSIS — M160 Bilateral primary osteoarthritis of hip: Secondary | ICD-10-CM | POA: Diagnosis not present

## 2014-10-17 DIAGNOSIS — Z23 Encounter for immunization: Secondary | ICD-10-CM

## 2014-10-17 DIAGNOSIS — R269 Unspecified abnormalities of gait and mobility: Secondary | ICD-10-CM

## 2014-10-17 LAB — AST: AST: 17 U/L (ref 0–37)

## 2014-10-17 LAB — LIPID PANEL
CHOLESTEROL: 199 mg/dL (ref 0–200)
HDL: 56.4 mg/dL (ref >39.00)
LDL Cholesterol: 133 mg/dL — ABNORMAL HIGH (ref 0–99)
NonHDL: 142.6
Total CHOL/HDL Ratio: 4
Triglycerides: 49 mg/dL (ref 0.0–149.0)
VLDL: 9.8 mg/dL (ref 0.0–40.0)

## 2014-10-17 LAB — ALT: ALT: 13 U/L (ref 0–53)

## 2014-10-17 MED ORDER — OMEPRAZOLE 20 MG PO CPDR: DELAYED_RELEASE_CAPSULE | ORAL | Status: DC

## 2014-10-17 NOTE — Patient Instructions (Signed)
Get your blood work before you leave    Please come back to the office in 1 year for a physical exam. Come back fasting      Check the  blood pressure   weekly  Be sure your blood pressure is between 140/85  and 110/65.  if it is consistently higher or lower, let me know

## 2014-10-17 NOTE — Assessment & Plan Note (Addendum)
Sees  hematology every 6 months, next visit February 2016

## 2014-10-17 NOTE — Assessment & Plan Note (Addendum)
Td  2011 Had a Flu shot   pneumonia shot  2007 and 09-2012 prevnar today shingles shot Rx was provided before  most labs done elsewhere  Prostate cancer screening, recommend no further screening due to age and overall status.  colonoscopy 05/2007 negative except for tics, Cscope again for anemia 09-11-12:same result

## 2014-10-17 NOTE — Assessment & Plan Note (Signed)
On no medications, he remains asymptomatic

## 2014-10-17 NOTE — Assessment & Plan Note (Signed)
Occasional hip pain, right less likely due to DJD, recommend Tylenol, avoid  Motrin-like medications. He also complains of  difficulty with his gait, no recent falls. I recommend a the use of a cane, consider  physical therapy for gait disorder---> he agreed , will be referred

## 2014-10-17 NOTE — Progress Notes (Signed)
Subjective:    Patient ID: [name], male    DOB: 05/29/1927, 80 y.o.   MRN: [id number]  DOS:  10/17/2014 Type of visit - description :  Here w/ daughter  1. Risk factors based on Past M, S, F history: reviewed 2. Physical Activities:  Active at home, walks 2 miles a the gym 3. Depression/mood:  Neg screening   4. Hearing:  Uses hearing aids L worse than R, sees ENT also 5. ADL's:  Independent very limited  driving 6. Fall Risk: prevention discussed , Refer to physical therapy  7. home Safety: does feel safe at home   8. Height, weight, &visual acuity: see VS, vision ok sees eye doctor yearly, last visit 03-2014 9. Counseling: provided 10. Labs ordered based on risk factors: if needed   11. Referral Coordination: if needed 12.  Care Plan, see assessment and plan   13.   Cognitive Assessment: Motor skills and cognition appropriate for age  In addition, today we discussed the following:  Vascular visit  07-2014, they recommend a noncontrast CT angiogram in 6 months and strict  cardiovascular risk factor control CKD, sees nephrology regularly, see assessment and plan, note reviewed DJD, occasional hip pain bilaterally, sometimes right side sometimes  left. Also has noted a decrease balance.  Sometimes has bilateral leg pain  described as cramps High blood pressure, good medication compliance, ambulatory BP 130, 140/70 COPD, on no medications, essentially no respiratory symptoms except for occasional cough without sputum production.   ROS   Denies chest pain-SOB No nausea, vomiting, diarrhea or blood in the stools. No dysuria or gross hematuria  Past Medical History  Diagnosis Date  . Abdominal aortic aneurysm 09/2008    s/o endovascular repair and angiogram 10/04/08   . Bleeding ulcer 1997  . Leukopenia 2/11    thought to be from doxy  . Renal cell carcinoma     clear cell type dx 10/09, s/p excision 10-24-08  . Hypertension   . Renal insufficiency   . COPD (chronic  obstructive pulmonary disease)   . Anemia     EGD Cscope 08-2012    Past Surgical History  Procedure Laterality Date  . Mohs surgery      1997 (lip), 2004  . Hemorrhoid surgery  1990  . Nephrectomy  2009    Open nephrectomy for cancer  . Abdominal aortic aneurysm repair  2009    EVAR    History   Social History  . Marital Status: Married    Spouse Name: N/A    Number of Children: 3  . Years of Education: N/A   Occupational History  . retired    Social History Main Topics  . Smoking status: Former Smoker    Quit date: 12/28/2007  . Smokeless tobacco: Never Used     Comment: used to smoke 1.5 ppd  . Alcohol Use: No  . Drug Use: No  . Sexual Activity: Not on file   Other Topics Concern  . Not on file   Social History Narrative   Lives w/ wife who has dementia    Totally independent on ADL    Often came to office w/ his daughter July [phone]), his other daughter is  [name] , son is Spence                        Family History  Problem Relation Age of Onset  . Diabetes Father     ?  Marland Kitchen  Stroke Father   . Hypertension Father   . Colon cancer Neg Hx   . Prostate cancer Neg Hx        Medication List       This list is accurate as of: 10/17/14 11:59 PM.  Always use your most recent med list.               amLODipine 10 MG tablet  Commonly known as:  NORVASC  Take 10 mg by mouth daily.     Casanthranol-Docusate Sodium 30-100 MG Caps  Take by mouth.     ergocalciferol 50000 UNITS capsule  Commonly known as:  VITAMIN D2  Take 50,000 Units by mouth every 30 (thirty) days.     MULTIVITAMIN/IRON PO  Take by mouth.     omeprazole 20 MG capsule  Commonly known as:  PRILOSEC  TAKE ONE CAPSULE BY MOUTH DAILY     SE-TAN PLUS 162-115.2-1 MG Caps  Take 1 tablet by mouth daily. Take 1 Tab  daily     torsemide 10 MG tablet  Commonly known as:  DEMADEX  Take 5 mg by mouth every other day.           Objective:   Physical Exam BP 134/64  Pulse  62  Temp(Src) 97.9 F (36.6 C) (Oral)  Ht 5\' 8"  (1.727 m)  Wt 164 lb 4 oz (74.503 kg)  BMI 24.98 kg/m2  SpO2 98% General -- alert, well-developed, NAD.  Neck --no thyromegaly , normal carotid pulse  HEENT-- Not pale.   Lungs -- normal respiratory effort, no intercostal retractions, no accessory muscle use, and decreased breath sounds.  Heart-- normal rate, regular rhythm, no murmur.  Abdomen-- Not distended, good bowel sounds,soft, non-tender. Extremities-- no pretibial edema bilaterally ; normal femoral-pedal pulses B; Hip rotation slightly decreased bilaterally and mildly  painful Neurologic--  alert & oriented X3. Speech normal, gait appropriate for age, strength symmetric and appropriate for age.  Psych-- Cognition and judgment appear intact. Cooperative with normal attention span and concentration. No anxious or depressed appearing.      Assessment & Plan:   Today , I spent more than 28   min with the patient: >50% of the time counseling regards gait ,the need for PT, also  reviewing the chart and labs ordered by other providers and  coordinating his care

## 2014-10-17 NOTE — Progress Notes (Signed)
Pre visit review using our clinic review tool, if applicable. No additional management support is needed unless otherwise documented below in the visit note. 

## 2014-10-17 NOTE — Assessment & Plan Note (Signed)
Note from vascular reviewed, he felt to be stable, plan is to  do a CT without contrast in 6 months.

## 2014-10-17 NOTE — Assessment & Plan Note (Signed)
Symptoms well controlled, continue amlodipine, recommend ambulatory BPs, see instructions

## 2014-10-17 NOTE — Assessment & Plan Note (Signed)
Last visit with nephrology [phone], note reviewed , creatinine was 2.0, potassium 4.2, A1c 5.5.

## 2014-10-29 ENCOUNTER — Ambulatory Visit: Payer: Medicare Other | Attending: Internal Medicine | Admitting: Physical Therapy

## 2014-10-29 DIAGNOSIS — I10 Essential (primary) hypertension: Secondary | ICD-10-CM | POA: Insufficient documentation

## 2014-10-29 DIAGNOSIS — M1990 Unspecified osteoarthritis, unspecified site: Secondary | ICD-10-CM | POA: Insufficient documentation

## 2014-10-29 DIAGNOSIS — M25551 Pain in right hip: Secondary | ICD-10-CM | POA: Insufficient documentation

## 2014-10-29 DIAGNOSIS — Z5189 Encounter for other specified aftercare: Secondary | ICD-10-CM | POA: Diagnosis not present

## 2014-10-29 DIAGNOSIS — R29898 Other symptoms and signs involving the musculoskeletal system: Secondary | ICD-10-CM | POA: Diagnosis not present

## 2014-10-29 DIAGNOSIS — R269 Unspecified abnormalities of gait and mobility: Secondary | ICD-10-CM | POA: Diagnosis not present

## 2014-10-29 DIAGNOSIS — M25552 Pain in left hip: Secondary | ICD-10-CM | POA: Insufficient documentation

## 2014-10-29 DIAGNOSIS — I714 Abdominal aortic aneurysm, without rupture: Secondary | ICD-10-CM | POA: Insufficient documentation

## 2014-11-05 ENCOUNTER — Ambulatory Visit: Payer: Medicare Other | Admitting: Rehabilitation

## 2014-11-05 DIAGNOSIS — Z5189 Encounter for other specified aftercare: Secondary | ICD-10-CM | POA: Diagnosis not present

## 2014-11-05 DIAGNOSIS — N183 Chronic kidney disease, stage 3 (moderate): Secondary | ICD-10-CM | POA: Diagnosis not present

## 2014-11-05 DIAGNOSIS — I10 Essential (primary) hypertension: Secondary | ICD-10-CM | POA: Diagnosis not present

## 2014-11-06 ENCOUNTER — Ambulatory Visit: Payer: Medicare Other | Admitting: Rehabilitation

## 2014-11-06 DIAGNOSIS — Z5189 Encounter for other specified aftercare: Secondary | ICD-10-CM | POA: Diagnosis not present

## 2014-11-12 ENCOUNTER — Ambulatory Visit: Payer: Medicare Other | Admitting: Physical Therapy

## 2014-11-12 DIAGNOSIS — Z5189 Encounter for other specified aftercare: Secondary | ICD-10-CM | POA: Diagnosis not present

## 2014-11-13 ENCOUNTER — Other Ambulatory Visit: Payer: Self-pay | Admitting: Internal Medicine

## 2014-11-13 ENCOUNTER — Other Ambulatory Visit (HOSPITAL_BASED_OUTPATIENT_CLINIC_OR_DEPARTMENT_OTHER): Payer: Self-pay | Admitting: Internal Medicine

## 2014-11-13 ENCOUNTER — Ambulatory Visit: Payer: Medicare Other | Admitting: Rehabilitation

## 2014-11-13 DIAGNOSIS — N184 Chronic kidney disease, stage 4 (severe): Secondary | ICD-10-CM | POA: Diagnosis not present

## 2014-11-13 DIAGNOSIS — I10 Essential (primary) hypertension: Secondary | ICD-10-CM | POA: Diagnosis not present

## 2014-11-13 DIAGNOSIS — N390 Urinary tract infection, site not specified: Secondary | ICD-10-CM | POA: Diagnosis not present

## 2014-11-13 DIAGNOSIS — Z5189 Encounter for other specified aftercare: Secondary | ICD-10-CM | POA: Diagnosis not present

## 2014-11-18 ENCOUNTER — Ambulatory Visit: Payer: Medicare Other | Admitting: Physical Therapy

## 2014-11-20 ENCOUNTER — Ambulatory Visit (HOSPITAL_BASED_OUTPATIENT_CLINIC_OR_DEPARTMENT_OTHER)
Admission: RE | Admit: 2014-11-20 | Discharge: 2014-11-20 | Disposition: A | Discharge status: Home / Self Care | Payer: Medicare Other | Source: Ambulatory Visit | Attending: Internal Medicine | Admitting: Internal Medicine

## 2014-11-20 ENCOUNTER — Ambulatory Visit: Payer: Medicare Other | Admitting: Physical Therapy

## 2014-11-20 DIAGNOSIS — Z85528 Personal history of other malignant neoplasm of kidney: Secondary | ICD-10-CM | POA: Insufficient documentation

## 2014-11-20 DIAGNOSIS — N189 Chronic kidney disease, unspecified: Secondary | ICD-10-CM | POA: Diagnosis not present

## 2014-11-20 DIAGNOSIS — R319 Hematuria, unspecified: Secondary | ICD-10-CM | POA: Insufficient documentation

## 2014-11-20 DIAGNOSIS — N281 Cyst of kidney, acquired: Secondary | ICD-10-CM | POA: Diagnosis not present

## 2014-11-20 DIAGNOSIS — N184 Chronic kidney disease, stage 4 (severe): Secondary | ICD-10-CM

## 2014-11-20 DIAGNOSIS — Z5189 Encounter for other specified aftercare: Secondary | ICD-10-CM | POA: Diagnosis not present

## 2014-11-25 ENCOUNTER — Ambulatory Visit: Payer: Medicare Other | Admitting: Physical Therapy

## 2014-11-25 ENCOUNTER — Other Ambulatory Visit (HOSPITAL_BASED_OUTPATIENT_CLINIC_OR_DEPARTMENT_OTHER): Payer: Medicare Other

## 2014-11-25 DIAGNOSIS — Z5189 Encounter for other specified aftercare: Secondary | ICD-10-CM | POA: Diagnosis not present

## 2014-11-25 DIAGNOSIS — Z8553 Personal history of malignant neoplasm of renal pelvis: Secondary | ICD-10-CM | POA: Diagnosis not present

## 2014-11-26 DIAGNOSIS — N183 Chronic kidney disease, stage 3 (moderate): Secondary | ICD-10-CM | POA: Diagnosis not present

## 2014-11-26 DIAGNOSIS — I10 Essential (primary) hypertension: Secondary | ICD-10-CM | POA: Diagnosis not present

## 2014-11-28 ENCOUNTER — Ambulatory Visit: Payer: Medicare Other | Attending: Internal Medicine | Admitting: Rehabilitation

## 2014-11-28 DIAGNOSIS — R269 Unspecified abnormalities of gait and mobility: Secondary | ICD-10-CM | POA: Insufficient documentation

## 2014-11-28 DIAGNOSIS — M25552 Pain in left hip: Secondary | ICD-10-CM | POA: Insufficient documentation

## 2014-11-28 DIAGNOSIS — Z5189 Encounter for other specified aftercare: Secondary | ICD-10-CM | POA: Diagnosis not present

## 2014-11-28 DIAGNOSIS — M1990 Unspecified osteoarthritis, unspecified site: Secondary | ICD-10-CM | POA: Insufficient documentation

## 2014-11-28 DIAGNOSIS — I714 Abdominal aortic aneurysm, without rupture: Secondary | ICD-10-CM | POA: Diagnosis not present

## 2014-11-28 DIAGNOSIS — M25551 Pain in right hip: Secondary | ICD-10-CM | POA: Diagnosis not present

## 2014-11-28 DIAGNOSIS — R29898 Other symptoms and signs involving the musculoskeletal system: Secondary | ICD-10-CM | POA: Insufficient documentation

## 2014-11-28 DIAGNOSIS — I10 Essential (primary) hypertension: Secondary | ICD-10-CM | POA: Insufficient documentation

## 2014-12-02 ENCOUNTER — Ambulatory Visit: Payer: Medicare Other | Admitting: Physical Therapy

## 2014-12-02 DIAGNOSIS — Z5189 Encounter for other specified aftercare: Secondary | ICD-10-CM | POA: Diagnosis not present

## 2014-12-04 ENCOUNTER — Ambulatory Visit: Payer: Medicare Other | Admitting: Rehabilitation

## 2014-12-05 DIAGNOSIS — Z08 Encounter for follow-up examination after completed treatment for malignant neoplasm: Secondary | ICD-10-CM | POA: Diagnosis not present

## 2014-12-05 DIAGNOSIS — Z85828 Personal history of other malignant neoplasm of skin: Secondary | ICD-10-CM | POA: Diagnosis not present

## 2014-12-09 ENCOUNTER — Ambulatory Visit: Payer: Medicare Other | Admitting: Physical Therapy

## 2014-12-09 DIAGNOSIS — Z5189 Encounter for other specified aftercare: Secondary | ICD-10-CM | POA: Diagnosis not present

## 2014-12-10 ENCOUNTER — Ambulatory Visit: Payer: Medicare Other | Admitting: Physical Therapy

## 2014-12-10 DIAGNOSIS — Z5189 Encounter for other specified aftercare: Secondary | ICD-10-CM | POA: Diagnosis not present

## 2014-12-11 DIAGNOSIS — N184 Chronic kidney disease, stage 4 (severe): Secondary | ICD-10-CM | POA: Diagnosis not present

## 2014-12-11 DIAGNOSIS — N390 Urinary tract infection, site not specified: Secondary | ICD-10-CM | POA: Diagnosis not present

## 2014-12-11 DIAGNOSIS — D649 Anemia, unspecified: Secondary | ICD-10-CM | POA: Diagnosis not present

## 2014-12-11 DIAGNOSIS — I10 Essential (primary) hypertension: Secondary | ICD-10-CM | POA: Diagnosis not present

## 2014-12-12 ENCOUNTER — Ambulatory Visit: Payer: Medicare Other | Admitting: Physical Therapy

## 2014-12-23 ENCOUNTER — Ambulatory Visit: Payer: Medicare Other | Admitting: Rehabilitation

## 2014-12-23 DIAGNOSIS — Z5189 Encounter for other specified aftercare: Secondary | ICD-10-CM | POA: Diagnosis not present

## 2014-12-25 ENCOUNTER — Ambulatory Visit: Payer: Medicare Other | Admitting: Rehabilitation

## 2014-12-25 DIAGNOSIS — Z5189 Encounter for other specified aftercare: Secondary | ICD-10-CM | POA: Diagnosis not present

## 2014-12-26 DIAGNOSIS — N183 Chronic kidney disease, stage 3 (moderate): Secondary | ICD-10-CM | POA: Diagnosis not present

## 2014-12-26 DIAGNOSIS — I10 Essential (primary) hypertension: Secondary | ICD-10-CM | POA: Diagnosis not present

## 2014-12-30 ENCOUNTER — Ambulatory Visit: Payer: Medicare Other | Admitting: Rehabilitation

## 2014-12-30 ENCOUNTER — Ambulatory Visit: Payer: Medicare Other | Attending: Internal Medicine

## 2014-12-30 DIAGNOSIS — M25552 Pain in left hip: Secondary | ICD-10-CM | POA: Diagnosis not present

## 2014-12-30 DIAGNOSIS — M25551 Pain in right hip: Secondary | ICD-10-CM | POA: Diagnosis not present

## 2014-12-30 DIAGNOSIS — R29898 Other symptoms and signs involving the musculoskeletal system: Secondary | ICD-10-CM | POA: Diagnosis not present

## 2014-12-30 DIAGNOSIS — M1990 Unspecified osteoarthritis, unspecified site: Secondary | ICD-10-CM | POA: Diagnosis not present

## 2014-12-30 DIAGNOSIS — Z5189 Encounter for other specified aftercare: Secondary | ICD-10-CM | POA: Diagnosis not present

## 2014-12-30 DIAGNOSIS — I714 Abdominal aortic aneurysm, without rupture: Secondary | ICD-10-CM | POA: Insufficient documentation

## 2014-12-30 DIAGNOSIS — I10 Essential (primary) hypertension: Secondary | ICD-10-CM | POA: Insufficient documentation

## 2014-12-30 DIAGNOSIS — R269 Unspecified abnormalities of gait and mobility: Secondary | ICD-10-CM | POA: Diagnosis not present

## 2015-01-01 DIAGNOSIS — N184 Chronic kidney disease, stage 4 (severe): Secondary | ICD-10-CM | POA: Diagnosis not present

## 2015-01-01 DIAGNOSIS — N390 Urinary tract infection, site not specified: Secondary | ICD-10-CM | POA: Diagnosis not present

## 2015-01-01 DIAGNOSIS — I10 Essential (primary) hypertension: Secondary | ICD-10-CM | POA: Diagnosis not present

## 2015-01-02 ENCOUNTER — Ambulatory Visit: Payer: Medicare Other | Admitting: Rehabilitation

## 2015-01-02 DIAGNOSIS — M25552 Pain in left hip: Secondary | ICD-10-CM | POA: Diagnosis not present

## 2015-01-02 DIAGNOSIS — R29898 Other symptoms and signs involving the musculoskeletal system: Secondary | ICD-10-CM | POA: Diagnosis not present

## 2015-01-02 DIAGNOSIS — R269 Unspecified abnormalities of gait and mobility: Secondary | ICD-10-CM | POA: Diagnosis not present

## 2015-01-02 DIAGNOSIS — Z5189 Encounter for other specified aftercare: Secondary | ICD-10-CM | POA: Diagnosis not present

## 2015-01-02 DIAGNOSIS — M25551 Pain in right hip: Secondary | ICD-10-CM | POA: Diagnosis not present

## 2015-01-02 DIAGNOSIS — I10 Essential (primary) hypertension: Secondary | ICD-10-CM | POA: Diagnosis not present

## 2015-01-05 ENCOUNTER — Other Ambulatory Visit: Payer: Self-pay | Admitting: Internal Medicine

## 2015-01-06 ENCOUNTER — Other Ambulatory Visit: Payer: Self-pay

## 2015-01-07 ENCOUNTER — Ambulatory Visit: Payer: Medicare Other | Admitting: Rehabilitation

## 2015-01-07 DIAGNOSIS — I10 Essential (primary) hypertension: Secondary | ICD-10-CM | POA: Diagnosis not present

## 2015-01-07 DIAGNOSIS — R29898 Other symptoms and signs involving the musculoskeletal system: Secondary | ICD-10-CM | POA: Diagnosis not present

## 2015-01-07 DIAGNOSIS — M25551 Pain in right hip: Secondary | ICD-10-CM | POA: Diagnosis not present

## 2015-01-07 DIAGNOSIS — R269 Unspecified abnormalities of gait and mobility: Secondary | ICD-10-CM | POA: Diagnosis not present

## 2015-01-07 DIAGNOSIS — Z5189 Encounter for other specified aftercare: Secondary | ICD-10-CM | POA: Diagnosis not present

## 2015-01-07 DIAGNOSIS — M25552 Pain in left hip: Secondary | ICD-10-CM | POA: Diagnosis not present

## 2015-01-09 ENCOUNTER — Ambulatory Visit: Payer: Medicare Other | Admitting: Rehabilitation

## 2015-01-09 DIAGNOSIS — Z5189 Encounter for other specified aftercare: Secondary | ICD-10-CM | POA: Diagnosis not present

## 2015-01-09 DIAGNOSIS — M25551 Pain in right hip: Secondary | ICD-10-CM | POA: Diagnosis not present

## 2015-01-09 DIAGNOSIS — I10 Essential (primary) hypertension: Secondary | ICD-10-CM | POA: Diagnosis not present

## 2015-01-09 DIAGNOSIS — R269 Unspecified abnormalities of gait and mobility: Secondary | ICD-10-CM | POA: Diagnosis not present

## 2015-01-09 DIAGNOSIS — M25552 Pain in left hip: Secondary | ICD-10-CM | POA: Diagnosis not present

## 2015-01-09 DIAGNOSIS — R29898 Other symptoms and signs involving the musculoskeletal system: Secondary | ICD-10-CM | POA: Diagnosis not present

## 2015-01-13 ENCOUNTER — Ambulatory Visit: Payer: Medicare Other | Admitting: Rehabilitation

## 2015-01-13 DIAGNOSIS — I10 Essential (primary) hypertension: Secondary | ICD-10-CM | POA: Diagnosis not present

## 2015-01-13 DIAGNOSIS — M25551 Pain in right hip: Secondary | ICD-10-CM | POA: Diagnosis not present

## 2015-01-13 DIAGNOSIS — Z5189 Encounter for other specified aftercare: Secondary | ICD-10-CM | POA: Diagnosis not present

## 2015-01-13 DIAGNOSIS — R29898 Other symptoms and signs involving the musculoskeletal system: Secondary | ICD-10-CM | POA: Diagnosis not present

## 2015-01-13 DIAGNOSIS — M25552 Pain in left hip: Secondary | ICD-10-CM | POA: Diagnosis not present

## 2015-01-13 DIAGNOSIS — R269 Unspecified abnormalities of gait and mobility: Secondary | ICD-10-CM | POA: Diagnosis not present

## 2015-01-16 ENCOUNTER — Ambulatory Visit: Payer: Medicare Other | Admitting: Rehabilitation

## 2015-01-16 DIAGNOSIS — M25552 Pain in left hip: Secondary | ICD-10-CM | POA: Diagnosis not present

## 2015-01-16 DIAGNOSIS — M25551 Pain in right hip: Secondary | ICD-10-CM | POA: Diagnosis not present

## 2015-01-16 DIAGNOSIS — Z5189 Encounter for other specified aftercare: Secondary | ICD-10-CM | POA: Diagnosis not present

## 2015-01-16 DIAGNOSIS — I10 Essential (primary) hypertension: Secondary | ICD-10-CM | POA: Diagnosis not present

## 2015-01-16 DIAGNOSIS — R29898 Other symptoms and signs involving the musculoskeletal system: Secondary | ICD-10-CM | POA: Diagnosis not present

## 2015-01-16 DIAGNOSIS — R269 Unspecified abnormalities of gait and mobility: Secondary | ICD-10-CM | POA: Diagnosis not present

## 2015-01-20 ENCOUNTER — Ambulatory Visit: Payer: Medicare Other | Admitting: Rehabilitation

## 2015-01-20 DIAGNOSIS — I10 Essential (primary) hypertension: Secondary | ICD-10-CM | POA: Diagnosis not present

## 2015-01-20 DIAGNOSIS — M25551 Pain in right hip: Secondary | ICD-10-CM | POA: Diagnosis not present

## 2015-01-20 DIAGNOSIS — M25552 Pain in left hip: Secondary | ICD-10-CM | POA: Diagnosis not present

## 2015-01-20 DIAGNOSIS — R269 Unspecified abnormalities of gait and mobility: Secondary | ICD-10-CM | POA: Diagnosis not present

## 2015-01-20 DIAGNOSIS — R29898 Other symptoms and signs involving the musculoskeletal system: Secondary | ICD-10-CM | POA: Diagnosis not present

## 2015-01-20 DIAGNOSIS — Z5189 Encounter for other specified aftercare: Secondary | ICD-10-CM | POA: Diagnosis not present

## 2015-01-23 ENCOUNTER — Ambulatory Visit: Payer: Medicare Other | Admitting: Rehabilitation

## 2015-01-23 DIAGNOSIS — M25552 Pain in left hip: Secondary | ICD-10-CM | POA: Diagnosis not present

## 2015-01-23 DIAGNOSIS — M25551 Pain in right hip: Secondary | ICD-10-CM | POA: Diagnosis not present

## 2015-01-23 DIAGNOSIS — R29898 Other symptoms and signs involving the musculoskeletal system: Secondary | ICD-10-CM | POA: Diagnosis not present

## 2015-01-23 DIAGNOSIS — Z5189 Encounter for other specified aftercare: Secondary | ICD-10-CM | POA: Diagnosis not present

## 2015-01-23 DIAGNOSIS — R269 Unspecified abnormalities of gait and mobility: Secondary | ICD-10-CM | POA: Diagnosis not present

## 2015-01-23 DIAGNOSIS — I10 Essential (primary) hypertension: Secondary | ICD-10-CM | POA: Diagnosis not present

## 2015-01-27 ENCOUNTER — Ambulatory Visit: Payer: Medicare Other | Attending: Internal Medicine | Admitting: Rehabilitation

## 2015-01-27 DIAGNOSIS — M25552 Pain in left hip: Secondary | ICD-10-CM | POA: Insufficient documentation

## 2015-01-27 DIAGNOSIS — R269 Unspecified abnormalities of gait and mobility: Secondary | ICD-10-CM | POA: Insufficient documentation

## 2015-01-27 DIAGNOSIS — M1990 Unspecified osteoarthritis, unspecified site: Secondary | ICD-10-CM | POA: Insufficient documentation

## 2015-01-27 DIAGNOSIS — R29898 Other symptoms and signs involving the musculoskeletal system: Secondary | ICD-10-CM | POA: Diagnosis not present

## 2015-01-27 DIAGNOSIS — I714 Abdominal aortic aneurysm, without rupture: Secondary | ICD-10-CM | POA: Insufficient documentation

## 2015-01-27 DIAGNOSIS — M25551 Pain in right hip: Secondary | ICD-10-CM | POA: Insufficient documentation

## 2015-01-27 DIAGNOSIS — I10 Essential (primary) hypertension: Secondary | ICD-10-CM | POA: Diagnosis not present

## 2015-01-29 DIAGNOSIS — I10 Essential (primary) hypertension: Secondary | ICD-10-CM | POA: Diagnosis not present

## 2015-01-29 DIAGNOSIS — N183 Chronic kidney disease, stage 3 (moderate): Secondary | ICD-10-CM | POA: Diagnosis not present

## 2015-01-30 ENCOUNTER — Ambulatory Visit (INDEPENDENT_AMBULATORY_CARE_PROVIDER_SITE_OTHER): Payer: Medicare Other | Admitting: Medical

## 2015-01-30 ENCOUNTER — Encounter: Payer: Self-pay | Admitting: Medical

## 2015-01-30 ENCOUNTER — Other Ambulatory Visit: Payer: Self-pay | Admitting: Medical

## 2015-01-30 ENCOUNTER — Ambulatory Visit (HOSPITAL_BASED_OUTPATIENT_CLINIC_OR_DEPARTMENT_OTHER)
Admission: RE | Admit: 2015-01-30 | Discharge: 2015-01-30 | Disposition: A | Discharge status: Home / Self Care | Payer: Medicare Other | Source: Ambulatory Visit | Attending: Medical | Admitting: Medical

## 2015-01-30 ENCOUNTER — Ambulatory Visit: Payer: Medicare Other | Admitting: Rehabilitation

## 2015-01-30 VITALS — BP 175/80 | HR 66 | Temp 98.0°F | Ht 68.0 in | Wt 172.8 lb

## 2015-01-30 DIAGNOSIS — I771 Stricture of artery: Secondary | ICD-10-CM | POA: Diagnosis not present

## 2015-01-30 DIAGNOSIS — M25551 Pain in right hip: Secondary | ICD-10-CM | POA: Diagnosis not present

## 2015-01-30 DIAGNOSIS — R600 Localized edema: Secondary | ICD-10-CM

## 2015-01-30 DIAGNOSIS — M25552 Pain in left hip: Secondary | ICD-10-CM | POA: Diagnosis not present

## 2015-01-30 DIAGNOSIS — R609 Edema, unspecified: Secondary | ICD-10-CM | POA: Insufficient documentation

## 2015-01-30 DIAGNOSIS — Z87891 Personal history of nicotine dependence: Secondary | ICD-10-CM | POA: Diagnosis not present

## 2015-01-30 DIAGNOSIS — I739 Peripheral vascular disease, unspecified: Secondary | ICD-10-CM | POA: Insufficient documentation

## 2015-01-30 DIAGNOSIS — M1612 Unilateral primary osteoarthritis, left hip: Secondary | ICD-10-CM | POA: Diagnosis not present

## 2015-01-30 DIAGNOSIS — R269 Unspecified abnormalities of gait and mobility: Secondary | ICD-10-CM | POA: Diagnosis not present

## 2015-01-30 DIAGNOSIS — G8929 Other chronic pain: Secondary | ICD-10-CM

## 2015-01-30 DIAGNOSIS — R252 Cramp and spasm: Secondary | ICD-10-CM | POA: Insufficient documentation

## 2015-01-30 DIAGNOSIS — I10 Essential (primary) hypertension: Secondary | ICD-10-CM

## 2015-01-30 DIAGNOSIS — M5442 Lumbago with sciatica, left side: Secondary | ICD-10-CM

## 2015-01-30 DIAGNOSIS — R531 Weakness: Secondary | ICD-10-CM | POA: Diagnosis not present

## 2015-01-30 DIAGNOSIS — M47816 Spondylosis without myelopathy or radiculopathy, lumbar region: Secondary | ICD-10-CM | POA: Diagnosis not present

## 2015-01-30 DIAGNOSIS — I714 Abdominal aortic aneurysm, without rupture: Secondary | ICD-10-CM | POA: Diagnosis not present

## 2015-01-30 DIAGNOSIS — M545 Low back pain: Secondary | ICD-10-CM | POA: Diagnosis not present

## 2015-01-30 DIAGNOSIS — I517 Cardiomegaly: Secondary | ICD-10-CM | POA: Diagnosis not present

## 2015-01-30 DIAGNOSIS — M25559 Pain in unspecified hip: Secondary | ICD-10-CM

## 2015-01-30 DIAGNOSIS — R29898 Other symptoms and signs involving the musculoskeletal system: Secondary | ICD-10-CM | POA: Diagnosis not present

## 2015-01-30 MED ORDER — AMLODIPINE BESYLATE 5 MG PO TABS: 5.0000 mg | ORAL_TABLET | Freq: Every day | ORAL | Status: DC

## 2015-01-30 NOTE — Assessment & Plan Note (Signed)
cxr today to assess if fluid in lungs.

## 2015-01-30 NOTE — Progress Notes (Signed)
Pre visit review using our clinic review tool, if applicable. No additional management support is needed unless otherwise documented below in the visit note. 

## 2015-01-30 NOTE — Assessment & Plan Note (Signed)
With some weakness of legs. Recent bmp done yesterday. I don't have those results. Will get mg today.

## 2015-01-30 NOTE — Progress Notes (Signed)
Subjective:    Patient ID: [name], male    DOB: 06-29-1927, 81 y.o.   MRN: [id number]  HPI   Pt in states over last year his legs have felt weak. Pt is active every day. He is walking about 1-1.7  miles a day. Over last 6 months he is walking little shorter distances. Last year he was walking about 2 miles a day. Pt has some hip pain at times. Legs feel little stiff in the mornings.   His feet swell some. He was previously on diuretic.   Pt blood pressure is elevated. He was taken off amlodipine by his nephrologist and stopped the torsemide.. No cardiac or neurologic signs or symptoms.  Pt bp usually 818-299 systolic. Diastolic in 37'J per daughter. Pt does have blood pressure cuff at home.   Labetolol 100 mg bid current bp med by nephrologist.  His calfs do cramp easily in moriing. Some at night.      Review of Systems  Constitutional: Negative for fever, chills, diaphoresis, activity change and fatigue.  Respiratory: Negative for cough, chest tightness and shortness of breath.   Cardiovascular: Negative for chest pain, palpitations and leg swelling.  Gastrointestinal: Negative for nausea, vomiting and abdominal pain.  Musculoskeletal: Negative for neck pain and neck stiffness.       Legs feel weak and cramp easily. Some back and hip pain. Pedal edema.  Neurological: Negative for dizziness, tremors, seizures, syncope, facial asymmetry, speech difficulty, weakness, light-headedness, numbness and headaches.  Psychiatric/Behavioral: Negative for behavioral problems, confusion and agitation. The patient is not nervous/anxious.    Past Medical History  Diagnosis Date  . Abdominal aortic aneurysm 09/2008    s/o endovascular repair and angiogram 10/04/08   . Bleeding ulcer 1997  . Leukopenia 2/11    thought to be from doxy  . Renal cell carcinoma     clear cell type dx 10/09, s/p excision 10-24-08  . Hypertension   . Renal insufficiency   . COPD (chronic obstructive  pulmonary disease)   . Anemia     EGD Cscope 08-2012    History   Social History  . Marital Status: Married    Spouse Name: N/A    Number of Children: 3  . Years of Education: N/A   Occupational History  . retired    Social History Main Topics  . Smoking status: Former Smoker    Quit date: 12/28/2007  . Smokeless tobacco: Never Used     Comment: used to smoke 1.5 ppd  . Alcohol Use: No  . Drug Use: No  . Sexual Activity: Not on file   Other Topics Concern  . Not on file   Social History Narrative   Lives w/ wife who has dementia    Totally independent on ADL    Often came to office w/ his daughter July [phone]), his other daughter is  [name] , son is [place]                       Past Surgical History  Procedure Laterality Date  . Mohs surgery      1997 (lip), 2004  . Hemorrhoid surgery  1990  . Nephrectomy  2009    Open nephrectomy for cancer  . Abdominal aortic aneurysm repair  2009    EVAR    Family History  Problem Relation Age of Onset  . Diabetes Father     ?  . Stroke Father   . Hypertension  Father   . Colon cancer Neg Hx   . Prostate cancer Neg Hx     No Known Allergies  Current Outpatient Prescriptions on File Prior to Visit  Medication Sig Dispense Refill  . Casanthranol-Docusate Sodium 30-100 MG CAPS Take by mouth.      . ergocalciferol (VITAMIN D2) 50000 UNITS capsule Take 50,000 Units by mouth every 30 (thirty) days.    . FeFum-FePo-FA-B Cmp-C-Zn-Mn-Cu (SE-TAN PLUS) 162-115.2-1 MG CAPS Take 1 tablet by mouth daily. Take 1 Tab  daily    . Multiple Vitamins-Iron (MULTIVITAMIN/IRON PO) Take by mouth.      Marland Kitchen omeprazole (PRILOSEC) 20 MG capsule TAKE ONE CAPSULE BY MOUTH DAILY 90 capsule 3   No current facility-administered medications on file prior to visit.    BP 188/79 mmHg  Pulse 66  Temp(Src) 98 F (36.7 C) (Oral)  Ht 5\' 8"  (1.727 m)  Wt 172 lb 12.8 oz (78.382 kg)  BMI 26.28 kg/m2  SpO2 98%       Objective:   Physical  Exam   General Mental Status- Alert. General Appearance- Not in acute distress.   Skin General: Color- Normal Color. Moisture- Normal Moisture.  Neck Carotid Arteries- Normal color. Moisture- Normal Moisture. No carotid bruits. No JVD.  Chest and Lung Exam Auscultation: Breath Sounds:-Normal.  Cardiovascular Auscultation:Rythm- Regular. Murmurs & Other Heart Sounds:Auscultation of the heart reveals- No Murmurs.  Abdomen Inspection:-Inspeection Normal. Palpation/Percussion:Note:No mass. Palpation and Percussion of the abdomen reveal- Non Tender, Non Distended + BS, no rebound or guarding.    Neurologic Cranial Nerve exam:- CN III-XII intact(No nystagmus), symmetric smile. Drift Test:- No drift. Romberg Exam:- Negative.  Strength:- 5/5 equal and symmetric strength both upper and lower extremities.  Lower ext- negative homans signs. 1-2+ edema bilateral with mild pain on palpation.     Back Mid lumbar spine tenderness to palpation. Pain on straight leg lift. Mid lt si area tenderness   Lt hip- mild pain on rom. No obvious crepitus.          Assessment & Plan:

## 2015-01-30 NOTE — Assessment & Plan Note (Signed)
Lumbar xray today.

## 2015-01-30 NOTE — Assessment & Plan Note (Signed)
Lt hip xray today.

## 2015-01-30 NOTE — Assessment & Plan Note (Signed)
I will prescribe amodipine  5 mg a day  to your labetolol regimen of 100mg  twice daily. Since your nephrologist recently made changes please call him today or tomorrow and get advisement if he agrees with this change. Inform him of initial bp reading in our office of 188/79 and then later 175/80. Please get some at home readings as well.   Increasing labetalol is option but pulse is presently 66.

## 2015-01-30 NOTE — Patient Instructions (Signed)
HTN (hypertension) I will prescribe amodipine  5 mg a day  to your labetolol regimen of 100mg  twice daily. Since your nephrologist recently made changes please call him today or tomorrow and get advisement if he agrees with this change. Inform him of initial bp reading in our office of 188/79 and then later 175/80. Please get some at home readings as well.   Increasing labetalol is option but pulse is presently 66.   Pedal edema cxr today to assess if fluid in lungs.   Hip pain, chronic Lt hip xray today.   Back pain Lumbar xray today.   Cramps of lower extremity  With some weakness of legs. Recent bmp done yesterday. I don't have those results. Will get mg today.   If at any point neurologic or cardiac symptoms with bp level high then ED eval. Follow up here 7 days or as needed.

## 2015-01-31 LAB — MAGNESIUM: MAGNESIUM: 2.1 mg/dL (ref 1.5–2.5)

## 2015-02-03 ENCOUNTER — Ambulatory Visit: Payer: Medicare Other | Admitting: Rehabilitation

## 2015-02-03 DIAGNOSIS — I714 Abdominal aortic aneurysm, without rupture: Secondary | ICD-10-CM | POA: Diagnosis not present

## 2015-02-03 DIAGNOSIS — M25551 Pain in right hip: Secondary | ICD-10-CM | POA: Diagnosis not present

## 2015-02-03 DIAGNOSIS — M25552 Pain in left hip: Secondary | ICD-10-CM | POA: Diagnosis not present

## 2015-02-03 DIAGNOSIS — R29898 Other symptoms and signs involving the musculoskeletal system: Secondary | ICD-10-CM | POA: Diagnosis not present

## 2015-02-03 DIAGNOSIS — R269 Unspecified abnormalities of gait and mobility: Secondary | ICD-10-CM | POA: Diagnosis not present

## 2015-02-03 DIAGNOSIS — I10 Essential (primary) hypertension: Secondary | ICD-10-CM | POA: Diagnosis not present

## 2015-02-04 DIAGNOSIS — H908 Mixed conductive and sensorineural hearing loss, unspecified: Secondary | ICD-10-CM | POA: Diagnosis not present

## 2015-02-04 DIAGNOSIS — H74329 Partial loss of ear ossicles, unspecified ear: Secondary | ICD-10-CM | POA: Diagnosis not present

## 2015-02-04 DIAGNOSIS — H6980 Other specified disorders of Eustachian tube, unspecified ear: Secondary | ICD-10-CM | POA: Diagnosis not present

## 2015-02-06 ENCOUNTER — Encounter: Payer: Self-pay | Admitting: Internal Medicine

## 2015-02-06 ENCOUNTER — Ambulatory Visit: Payer: Medicare Other | Admitting: Rehabilitation

## 2015-02-06 ENCOUNTER — Ambulatory Visit (INDEPENDENT_AMBULATORY_CARE_PROVIDER_SITE_OTHER): Payer: Medicare Other | Admitting: Medical

## 2015-02-06 VITALS — BP 171/80 | HR 66 | Temp 97.8°F | Ht 68.0 in | Wt 171.4 lb

## 2015-02-06 DIAGNOSIS — R269 Unspecified abnormalities of gait and mobility: Secondary | ICD-10-CM | POA: Diagnosis not present

## 2015-02-06 DIAGNOSIS — M5442 Lumbago with sciatica, left side: Secondary | ICD-10-CM | POA: Diagnosis not present

## 2015-02-06 DIAGNOSIS — R29898 Other symptoms and signs involving the musculoskeletal system: Secondary | ICD-10-CM | POA: Diagnosis not present

## 2015-02-06 DIAGNOSIS — I10 Essential (primary) hypertension: Secondary | ICD-10-CM

## 2015-02-06 DIAGNOSIS — M25552 Pain in left hip: Secondary | ICD-10-CM | POA: Diagnosis not present

## 2015-02-06 DIAGNOSIS — M25551 Pain in right hip: Secondary | ICD-10-CM | POA: Diagnosis not present

## 2015-02-06 DIAGNOSIS — I714 Abdominal aortic aneurysm, without rupture: Secondary | ICD-10-CM | POA: Diagnosis not present

## 2015-02-06 NOTE — Assessment & Plan Note (Addendum)
Based on recent bp readings. I do think amlodipine 5 mg is good idea. Please clarify with his nephrologist.

## 2015-02-06 NOTE — Patient Instructions (Signed)
HTN (hypertension) Based on recent bp readings. I do think amlodipine 5 mg is good idea. Please clarify with his nephrologist.   Back pain With lt si pain. Degenerative changes to spine and heavy sensation to legs. Electrolytes ok. No signs of claudication. Will refer to ortho eval for atypical spinal stenosis.     Follow up 3 months or as needed.

## 2015-02-06 NOTE — Assessment & Plan Note (Signed)
With lt si pain. Degenerative changes to spine and heavy sensation to legs. Electrolytes ok. No signs of claudication. Will refer to ortho eval for atypical spinal stenosis.

## 2015-02-06 NOTE — Progress Notes (Signed)
Pre visit review using our clinic review tool, if applicable. No additional management support is needed unless otherwise documented below in the visit note. 

## 2015-02-06 NOTE — Progress Notes (Signed)
Subjective:    Patient ID: [name], male    DOB: 1927/10/30, 81 y.o.   MRN: [id number]  HPI    Pt in for follow up on his blood pressure. Pt was on hctz previously. Pt has not been amlodipine. Pt has new bp machine.Pt bp readings at home about half 130/80. Other half readings about 150/80. High readings are with new machine. Lower readings old machine.  No cardiac or neurologic signs or symptoms.  Pt legs are still feeling weak. Feeling weighted down. Feels like each leg weighs 30 pounds. And some low back pain. Lumbar and lt si area. Little tingly and numb pain at times. Sitting down has no pain. They do not feel heavy when seated.   Xray of spine shows degenerative changes.   Still walking mile or two a day.No cramping.     Review of Systems  Constitutional: Negative for fever, chills, diaphoresis, activity change and fatigue.  Respiratory: Negative for cough, chest tightness and shortness of breath.   Cardiovascular: Negative for chest pain, palpitations and leg swelling.  Gastrointestinal: Negative for nausea, vomiting and abdominal pain.  Musculoskeletal: Negative for neck pain and neck stiffness.       Back pain and leg weakness.  Neurological: Negative for dizziness, tremors, seizures, syncope, facial asymmetry, speech difficulty, weakness, light-headedness, numbness and headaches.  Psychiatric/Behavioral: Negative for behavioral problems, confusion and agitation. The patient is not nervous/anxious.    Past Medical History  Diagnosis Date  . Abdominal aortic aneurysm 09/2008    s/o endovascular repair and angiogram 10/04/08   . Bleeding ulcer 1997  . Leukopenia 2/11    thought to be from doxy  . Renal cell carcinoma     clear cell type dx 10/09, s/p excision 10-24-08  . Hypertension   . Renal insufficiency   . COPD (chronic obstructive pulmonary disease)   . Anemia     EGD Cscope 08-2012    History   Social History  . Marital Status: Married    Spouse  Name: N/A  . Number of Children: 3  . Years of Education: N/A   Occupational History  . retired    Social History Main Topics  . Smoking status: Former Smoker    Quit date: 12/28/2007  . Smokeless tobacco: Never Used     Comment: used to smoke 1.5 ppd  . Alcohol Use: No  . Drug Use: No  . Sexual Activity: Not on file   Other Topics Concern  . Not on file   Social History Narrative   Lives w/ wife who has dementia    Totally independent on ADL    Often came to office w/ his daughter [name] [phone]), his other daughter is  [name] , son is [name]                       Past Surgical History  Procedure Laterality Date  . Mohs surgery      1997 (lip), 2004  . Hemorrhoid surgery  1990  . Nephrectomy  2009    Open nephrectomy for cancer  . Abdominal aortic aneurysm repair  2009    EVAR    Family History  Problem Relation Age of Onset  . Diabetes Father     ?  . Stroke Father   . Hypertension Father   . Colon cancer Neg Hx   . Prostate cancer Neg Hx     No Known Allergies  Current Outpatient Prescriptions on File  Prior to Visit  Medication Sig Dispense Refill  . Casanthranol-Docusate Sodium 30-100 MG CAPS Take by mouth.      . ergocalciferol (VITAMIN D2) 50000 UNITS capsule Take 50,000 Units by mouth every 30 (thirty) days.    . FeFum-FePo-FA-B Cmp-C-Zn-Mn-Cu (SE-TAN PLUS) 162-115.2-1 MG CAPS Take 1 tablet by mouth daily. Take 1 Tab  daily    . labetalol (NORMODYNE) 100 MG tablet Take 100 mg by mouth 2 (two) times daily.    . Multiple Vitamins-Iron (MULTIVITAMIN/IRON PO) Take by mouth.      Marland Kitchen omeprazole (PRILOSEC) 20 MG capsule TAKE ONE CAPSULE BY MOUTH DAILY 90 capsule 3  . amLODipine (NORVASC) 5 MG tablet TAKE 1 TABLET BY MOUTH EVERY DAY (Patient not taking: Reported on 02/06/2015) 90 tablet 2   No current facility-administered medications on file prior to visit.    BP 171/80 mmHg  Pulse 66  Temp(Src) 97.8 F (36.6 C) (Oral)  Ht 5\' 8"  (1.727 m)  Wt 171 lb  6.4 oz (77.747 kg)  BMI 26.07 kg/m2  SpO2 98%      Objective:   Physical Exam  General Mental Status- Alert. General Appearance- Not in acute distress.   Skin General: Color- Normal Color. Moisture- Normal Moisture.  Neck Carotid Arteries- Normal color. Moisture- Normal Moisture. No carotid bruits. No JVD.  Chest and Lung Exam Auscultation: Breath Sounds:-Normal.  Cardiovascular Auscultation:Rythm- Regular. Murmurs & Other Heart Sounds:Auscultation of the heart reveals- No Murmurs.   Neurologic Cranial Nerve exam:- CN III-XII intact(No nystagmus), symmetric smile. Strength:- 5/5 equal and symmetric strength both upper and lower extremities.  Back- faint mid lspine pain and lt si area pain.    Assessment & Plan:

## 2015-02-10 ENCOUNTER — Ambulatory Visit: Payer: Medicare Other | Admitting: Surgery

## 2015-02-10 ENCOUNTER — Other Ambulatory Visit: Payer: Medicare Other

## 2015-02-10 DIAGNOSIS — D638 Anemia in other chronic diseases classified elsewhere: Secondary | ICD-10-CM | POA: Diagnosis not present

## 2015-02-10 DIAGNOSIS — D61818 Other pancytopenia: Secondary | ICD-10-CM | POA: Diagnosis not present

## 2015-02-13 DIAGNOSIS — N184 Chronic kidney disease, stage 4 (severe): Secondary | ICD-10-CM | POA: Diagnosis not present

## 2015-02-13 DIAGNOSIS — N390 Urinary tract infection, site not specified: Secondary | ICD-10-CM | POA: Diagnosis not present

## 2015-02-13 DIAGNOSIS — I10 Essential (primary) hypertension: Secondary | ICD-10-CM | POA: Diagnosis not present

## 2015-02-14 ENCOUNTER — Encounter: Payer: Self-pay | Admitting: Surgery

## 2015-02-14 ENCOUNTER — Other Ambulatory Visit (HOSPITAL_COMMUNITY): Payer: Self-pay | Admitting: Internal Medicine

## 2015-02-14 DIAGNOSIS — I501 Left ventricular failure: Secondary | ICD-10-CM

## 2015-02-17 ENCOUNTER — Encounter: Payer: Self-pay | Admitting: Surgery

## 2015-02-17 ENCOUNTER — Ambulatory Visit (INDEPENDENT_AMBULATORY_CARE_PROVIDER_SITE_OTHER): Payer: Medicare Other | Admitting: Surgery

## 2015-02-17 ENCOUNTER — Other Ambulatory Visit (HOSPITAL_COMMUNITY): Payer: Self-pay | Admitting: Internal Medicine

## 2015-02-17 ENCOUNTER — Ambulatory Visit
Admission: RE | Admit: 2015-02-17 | Discharge: 2015-02-17 | Disposition: A | Discharge status: Home / Self Care | Payer: Medicare Other | Source: Ambulatory Visit | Attending: Family | Admitting: Family

## 2015-02-17 VITALS — BP 127/57 | HR 72 | Ht 68.0 in | Wt 173.8 lb

## 2015-02-17 DIAGNOSIS — Z48812 Encounter for surgical aftercare following surgery on the circulatory system: Secondary | ICD-10-CM | POA: Diagnosis not present

## 2015-02-17 DIAGNOSIS — N289 Disorder of kidney and ureter, unspecified: Secondary | ICD-10-CM | POA: Diagnosis not present

## 2015-02-17 DIAGNOSIS — I714 Abdominal aortic aneurysm, without rupture, unspecified: Secondary | ICD-10-CM

## 2015-02-17 DIAGNOSIS — N400 Enlarged prostate without lower urinary tract symptoms: Secondary | ICD-10-CM | POA: Diagnosis not present

## 2015-02-17 NOTE — Progress Notes (Signed)
Patient name: [name] MRN: [id number] DOB: 03/07/1927 Sex: male     Chief Complaint  Patient presents with  . Re-evaluation    6 month f/u - c/o increased heaviness when ambulating since last visit    HISTORY OF PRESENT ILLNESS: The patient returns today for followup. I last saw him one year ago. He is status post endovascular aneurysm repair on 10/04/2008. Subsequent to that he has undergone right nephrectomy secondary to a right renal cell tumor. He is down to approximately 20% function of his solitary kidney. For that reason I have been following him with ultrasound and noncontrast images.  He also reports having heaviness when he walks.  He does notice that he has been having worsening swelling.  Past Medical History  Diagnosis Date  . Abdominal aortic aneurysm 09/2008    s/o endovascular repair and angiogram 10/04/08   . Bleeding ulcer 1997  . Leukopenia 2/11    thought to be from doxy  . Renal cell carcinoma     clear cell type dx 10/09, s/p excision 10-24-08  . Hypertension   . Renal insufficiency   . COPD (chronic obstructive pulmonary disease)   . Anemia     EGD Cscope 08-2012    Past Surgical History  Procedure Laterality Date  . Mohs surgery      1997 (lip), 2004  . Hemorrhoid surgery  1990  . Nephrectomy  2009    Open nephrectomy for cancer  . Abdominal aortic aneurysm repair  2009    EVAR    History   Social History  . Marital Status: Married    Spouse Name: N/A  . Number of Children: 3  . Years of Education: N/A   Occupational History  . retired    Social History Main Topics  . Smoking status: Former Smoker    Quit date: 12/28/2007  . Smokeless tobacco: Never Used     Comment: used to smoke 1.5 ppd  . Alcohol Use: No  . Drug Use: No  . Sexual Activity: Not on file   Other Topics Concern  . Not on file   Social History Narrative   Lives w/ wife who has dementia    Totally independent on ADL    Often came to office w/ his  daughter July [phone]), his other daughter is  [name] , son is Esgar                       Family History  Problem Relation Age of Onset  . Diabetes Father     ?  . Stroke Father   . Hypertension Father   . Colon cancer Neg Hx   . Prostate cancer Neg Hx     Allergies as of 02/17/2015  . (No Known Allergies)    Current Outpatient Prescriptions on File Prior to Visit  Medication Sig Dispense Refill  . Casanthranol-Docusate Sodium 30-100 MG CAPS Take by mouth.      . ergocalciferol (VITAMIN D2) 50000 UNITS capsule Take 50,000 Units by mouth every 30 (thirty) days.    . FeFum-FePo-FA-B Cmp-C-Zn-Mn-Cu (SE-TAN PLUS) 162-115.2-1 MG CAPS Take 1 tablet by mouth daily. Take 1 Tab  daily    . hydrochlorothiazide (HYDRODIURIL) 12.5 MG tablet Take 12.5 mg by mouth daily.    Marland Kitchen labetalol (NORMODYNE) 100 MG tablet Take 100 mg by mouth 2 (two) times daily.    . Multiple Vitamins-Iron (MULTIVITAMIN/IRON PO) Take by mouth.      Marland Kitchen  omeprazole (PRILOSEC) 20 MG capsule TAKE ONE CAPSULE BY MOUTH DAILY 90 capsule 3  . amLODipine (NORVASC) 5 MG tablet TAKE 1 TABLET BY MOUTH EVERY DAY (Patient not taking: Reported on 02/06/2015) 90 tablet 2   No current facility-administered medications on file prior to visit.     REVIEW OF SYSTEMS: Positive for swelling and fatigue.  All other systems negative  PHYSICAL EXAMINATION:   Vital signs are BP 127/57 mmHg  Pulse 72  Ht 5\' 8"  (1.727 m)  Wt 173 lb 12.8 oz (78.835 kg)  BMI 26.43 kg/m2  SpO2 98% General: The patient appears their stated age. HEENT:  No gross abnormalities Pulmonary:  Non labored breathing Abdomen: Soft and non-tender Musculoskeletal: There are no major deformities. Neurologic: No focal weakness or paresthesias are detected, Skin: There are no ulcer or rashes noted. Psychiatric: The patient has normal affect. Cardiovascular: There is a regular rate and rhythm without significant murmur appreciated.  Bilateral edema.  Palpable pedal  pulses bilaterally   Diagnostic Studies CT scan today was with noncontrast imaging.  Maximum aortic diameter was 6.1 cm.   Assessment: Abdominal aortic aneurysm Plan: The patient has limited renal function remaining, and therefore contrast imaging is not possible.  His CT scan today shows a maximum aortic diameter of 6.1 cm.  This is essentially unchanged from his CT scan in 2011.  His aortic diameter at the time of his repair was 5.8 cm.  Between 2011 and 2015 he has had several ultrasounds which showed a decreasing size aneurysm.  I discussed with the family that it is difficult to determine whether or not his aneurysm has a endoleak and is increasing or whether his ultrasound measurements were not reliable.  Because I cannot give him contrast, I think the best course of action is to continue with serial noncontrasted CT imaging.  Next the has been scheduled for 6 months.  With regards to the heaviness he has with exercise, I suspect this is from lower extremity edema, as he has palpable pedal pulses.  I have recommended 20-30 compression stockings to be worn throughout the day.  Eldridge Abrahams, M.D. [facility] Office: [phone] Pager:  [phone]

## 2015-02-17 NOTE — Addendum Note (Signed)
Addended by: Mena Goes on: 02/17/2015 04:51 PM   Modules accepted: Orders

## 2015-02-24 DIAGNOSIS — N183 Chronic kidney disease, stage 3 (moderate): Secondary | ICD-10-CM | POA: Diagnosis not present

## 2015-02-24 DIAGNOSIS — I10 Essential (primary) hypertension: Secondary | ICD-10-CM | POA: Diagnosis not present

## 2015-02-24 DIAGNOSIS — R0602 Shortness of breath: Secondary | ICD-10-CM | POA: Diagnosis not present

## 2015-02-24 DIAGNOSIS — M10011 Idiopathic gout, right shoulder: Secondary | ICD-10-CM | POA: Diagnosis not present

## 2015-02-24 LAB — BASIC METABOLIC PANEL
BUN: 34 mg/dL — AB (ref 4–21)
Creatinine: 2.3 mg/dL — AB (ref <=1.3)
GLUCOSE: 93 mg/dL
POTASSIUM: 5.3 mmol/L (ref 3.4–5.3)
SODIUM: 139 mmol/L (ref 137–147)

## 2015-02-26 ENCOUNTER — Ambulatory Visit (HOSPITAL_BASED_OUTPATIENT_CLINIC_OR_DEPARTMENT_OTHER): Payer: BLUE CROSS/BLUE SHIELD

## 2015-02-27 DIAGNOSIS — N183 Chronic kidney disease, stage 3 (moderate): Secondary | ICD-10-CM | POA: Diagnosis not present

## 2015-02-27 DIAGNOSIS — I10 Essential (primary) hypertension: Secondary | ICD-10-CM | POA: Diagnosis not present

## 2015-02-27 DIAGNOSIS — N390 Urinary tract infection, site not specified: Secondary | ICD-10-CM | POA: Diagnosis not present

## 2015-03-05 ENCOUNTER — Ambulatory Visit (HOSPITAL_BASED_OUTPATIENT_CLINIC_OR_DEPARTMENT_OTHER)
Admission: RE | Admit: 2015-03-05 | Discharge: 2015-03-05 | Disposition: A | Discharge status: Home / Self Care | Payer: Medicare Other | Source: Ambulatory Visit | Attending: Internal Medicine | Admitting: Internal Medicine

## 2015-03-05 DIAGNOSIS — I501 Left ventricular failure: Secondary | ICD-10-CM | POA: Diagnosis not present

## 2015-03-05 DIAGNOSIS — I509 Heart failure, unspecified: Secondary | ICD-10-CM | POA: Diagnosis not present

## 2015-03-05 NOTE — Progress Notes (Signed)
  Echocardiogram 2D Echocardiogram has been performed.  Darlina Sicilian M 03/05/2015, 2:24 PM

## 2015-03-06 DIAGNOSIS — M4806 Spinal stenosis, lumbar region: Secondary | ICD-10-CM | POA: Diagnosis not present

## 2015-03-14 ENCOUNTER — Encounter: Payer: Self-pay | Admitting: Internal Medicine

## 2015-03-14 ENCOUNTER — Ambulatory Visit (INDEPENDENT_AMBULATORY_CARE_PROVIDER_SITE_OTHER): Payer: Medicare Other | Admitting: Internal Medicine

## 2015-03-14 ENCOUNTER — Other Ambulatory Visit: Payer: Self-pay

## 2015-03-14 VITALS — BP 128/72 | HR 63 | Temp 98.2°F | Ht 68.0 in | Wt 168.5 lb

## 2015-03-14 DIAGNOSIS — L0292 Furuncle, unspecified: Secondary | ICD-10-CM

## 2015-03-14 MED ORDER — CEFUROXIME AXETIL 500 MG PO TABS: 500.0000 mg | ORAL_TABLET | Freq: Two times a day (BID) | ORAL | Status: DC

## 2015-03-14 NOTE — Progress Notes (Signed)
Pre visit review using our clinic review tool, if applicable. No additional management support is needed unless otherwise documented below in the visit note. 

## 2015-03-14 NOTE — Progress Notes (Signed)
Subjective:    Patient ID: [name], male    DOB: 1927/01/16, 81 y.o.   MRN: [id number]  DOS:  03/14/2015 Type of visit - description : acute, here with his daughter Interval history: Today he noticed a lump at the left groin area. + tender to touch. Also, was seen recently with back pain, eventually seen by orthopedic surgery, prescribed prednisone on baclofen. Feeling better. Was intolerant of baclofen. Medication list reviewed, hydrochlorothiazide was switch to chlorthalidone by nephrology   Review of Systems Denies fever or chills No discharge  Past Medical History  Diagnosis Date  . Abdominal aortic aneurysm 09/2008    s/o endovascular repair and angiogram 10/04/08   . Bleeding ulcer 1997  . Leukopenia 2/11    thought to be from doxy  . Renal cell carcinoma     clear cell type dx 10/09, s/p excision 10-24-08  . Hypertension   . Renal insufficiency   . COPD (chronic obstructive pulmonary disease)   . Anemia     EGD Cscope 08-2012    Past Surgical History  Procedure Laterality Date  . Mohs surgery      1997 (lip), 2004  . Hemorrhoid surgery  1990  . Nephrectomy  2009    Open nephrectomy for cancer  . Abdominal aortic aneurysm repair  2009    EVAR    History   Social History  . Marital Status: Married    Spouse Name: N/A  . Number of Children: 3  . Years of Education: N/A   Occupational History  . retired    Social History Main Topics  . Smoking status: Former Smoker    Quit date: 12/28/2007  . Smokeless tobacco: Never Used     Comment: used to smoke 1.5 ppd  . Alcohol Use: No  . Drug Use: No  . Sexual Activity: Not on file   Other Topics Concern  . Not on file   Social History Narrative   Lives w/ wife who has dementia    Totally independent on ADL    Often came to office w/ his daughter July [phone]), his other daughter is  [name] , son is Colter                           Medication List       This list is accurate as of:  03/14/15 11:59 PM.  Always use your most recent med list.               amLODipine 5 MG tablet  Commonly known as:  NORVASC  TAKE 1 TABLET BY MOUTH EVERY DAY     Calcium 200 MG Tabs  Take 1 tablet by mouth daily.     Casanthranol-Docusate Sodium 30-100 MG Caps  Take by mouth.     cefUROXime 500 MG tablet  Commonly known as:  CEFTIN  Take 1 tablet (500 mg total) by mouth 2 (two) times daily with a meal.     chlorthalidone 25 MG tablet  Commonly known as:  HYGROTON  Take 25 mg by mouth daily.     ergocalciferol 50000 UNITS capsule  Commonly known as:  VITAMIN D2  Take 50,000 Units by mouth every 30 (thirty) days.     hydrALAZINE 25 MG tablet  Commonly known as:  APRESOLINE  Take 25 mg by mouth 2 (two) times daily.     labetalol 100 MG tablet  Commonly known as:  NORMODYNE  Take 100 mg by mouth 2 (two) times daily.     MULTIVITAMIN/IRON PO  Take by mouth.     omeprazole 20 MG capsule  Commonly known as:  PRILOSEC  TAKE ONE CAPSULE BY MOUTH DAILY     SE-TAN PLUS 162-115.2-1 MG Caps  Take 1 tablet by mouth daily. Take 1 Tab  daily           Objective:   Physical Exam  Constitutional: He appears well-developed and well-nourished. No distress.  HENT:  HOH  Abdominal:    Skin: He is not diaphoretic.  Psychiatric: He has a normal mood and affect. His behavior is normal. Judgment and thought content normal.   BP 128/72 mmHg  Pulse 63  Temp(Src) 98.2 F (36.8 C) (Oral)  Ht 5\' 8"  (1.727 m)  Wt 168 lb 8 oz (76.431 kg)  BMI 25.63 kg/m2  SpO2 98%       Assessment & Plan:   Boil,   findings consistent with a boil, nothing to incise or drain today. Plan: Antibiotics, warm compresses, if he comes to a head okay to self drain if possible, call if worse. See instructions.  Also, had a bad reaction to baclofen, wasn't able to walk. Will add that to his allergies

## 2015-03-14 NOTE — Patient Instructions (Signed)
Take Ceftin 1 tablet twice a day as prescribed for one week  Apply a warm compress twice a day  Tylenol as needed for pain  If you have fever, chills, the area of infection gets bigger: Call or go to the ER  If it comes to a head and it drains that is okay, simply let us know.

## 2015-03-17 ENCOUNTER — Telehealth: Payer: Self-pay | Admitting: Internal Medicine

## 2015-03-17 NOTE — Telephone Encounter (Signed)
Called patient to follow-up and spoke with daughter.  Daughter stated that it has been draining since yesterday and it was more reddish-purple.  The site has not grown in size.  They have scheduled a follow-up appointment with [name] for 03/18/15.

## 2015-03-17 NOTE — Telephone Encounter (Signed)
Patient Name: [name]  DOB: [birth year]    Initial Comment Caller states the patient has a boil in his groin area and around it has black discoloration   Nurse Assessment  Nurse: Verlin Fester, RN, Stanton Kidney Date/Time (Eastern Time): 03/17/2015 1:23:05 PM  Confirm and document reason for call. If symptomatic, describe symptoms. ---Daughter states the patient was seen Friday and was given an antibiotic and to put warm compresses on it. The doctor told them to let him know if it is draining. Today it has white thing it the center that is creamy colored. It has a blackish purplish color around it today.  Has the patient traveled out of the country within the last 30 days? ---No  Does the patient require triage? ---Yes  Related visit to physician within the last 2 weeks? ---Yes  Does the PT have any chronic conditions? (i.e. diabetes, asthma, etc.) ---Yes  List chronic conditions. ---"kidney disease, HTN"     Guidelines    Guideline Title Affirmed Question Affirmed Notes  Boil (Skin Abscess) Black (necrotic) color or blisters develop in wound    Final Disposition User   Go to ED Now (or PCP triage) Verlin Fester, RN, Stanton Kidney    Comments  After triage caller states she does not want to take patient ot ED today, asks for appt in office and unable to schedule any appts with any doctors. Instructed caller that I will send message to Dr. and she asks if she can get a call back today

## 2015-03-17 NOTE — Telephone Encounter (Signed)
Please see below.  eal

## 2015-03-17 NOTE — Telephone Encounter (Signed)
Please call the patient, he did have a boil, if he is draining but the area is not bigger or more painful that is okay. If needed, please schedule an appointment to see him tomorrow, overbook okay

## 2015-03-18 ENCOUNTER — Ambulatory Visit (INDEPENDENT_AMBULATORY_CARE_PROVIDER_SITE_OTHER): Payer: Medicare Other | Admitting: Internal Medicine

## 2015-03-18 ENCOUNTER — Encounter: Payer: Self-pay | Admitting: Internal Medicine

## 2015-03-18 VITALS — BP 128/72 | HR 65 | Temp 98.0°F | Ht 68.0 in | Wt 170.0 lb

## 2015-03-18 DIAGNOSIS — L0292 Furuncle, unspecified: Secondary | ICD-10-CM | POA: Diagnosis not present

## 2015-03-18 MED ORDER — DOXYCYCLINE MONOHYDRATE 100 MG PO CAPS: 100.0000 mg | ORAL_CAPSULE | Freq: Two times a day (BID) | ORAL | Status: DC

## 2015-03-18 NOTE — Progress Notes (Signed)
Subjective:    Patient ID: [name], male    DOB: 02-Oct-1927, 81 y.o.   MRN: [id number]  DOS:  03/18/2015 Type of visit - description : f/u, here w/ daughter  Interval history: Was recently diagnosed with a boil, good compliance with antibiotics, they saw some discharge and like a checkup.   Review of Systems Denies fever or chills. The area does not look much better to the patient.   Past Medical History  Diagnosis Date  . Abdominal aortic aneurysm 09/2008    s/o endovascular repair and angiogram 10/04/08   . Bleeding ulcer 1997  . Leukopenia 2/11    thought to be from doxy  . Renal cell carcinoma     clear cell type dx 10/09, s/p excision 10-24-08  . Hypertension   . Renal insufficiency   . COPD (chronic obstructive pulmonary disease)   . Anemia     EGD Cscope 08-2012    Past Surgical History  Procedure Laterality Date  . Mohs surgery      1997 (lip), 2004  . Hemorrhoid surgery  1990  . Nephrectomy  2009    Open nephrectomy for cancer  . Abdominal aortic aneurysm repair  2009    EVAR    History   Social History  . Marital Status: Married    Spouse Name: N/A  . Number of Children: 3  . Years of Education: N/A   Occupational History  . retired    Social History Main Topics  . Smoking status: Former Smoker    Quit date: 12/28/2007  . Smokeless tobacco: Never Used     Comment: used to smoke 1.5 ppd  . Alcohol Use: No  . Drug Use: No  . Sexual Activity: Not on file   Other Topics Concern  . Not on file   Social History Narrative   Lives w/ wife who has dementia    Totally independent on ADL    Often came to office w/ his daughter July [phone]), his other daughter is  [name] , son is Taysean                           Medication List       This list is accurate as of: 03/18/15 11:59 PM.  Always use your most recent med list.               amLODipine 5 MG tablet  Commonly known as:  NORVASC  TAKE 1 TABLET BY MOUTH EVERY DAY     Calcium 200 MG Tabs  Take 1 tablet by mouth daily.     Casanthranol-Docusate Sodium 30-100 MG Caps  Take by mouth.     chlorthalidone 25 MG tablet  Commonly known as:  HYGROTON  Take 25 mg by mouth daily.     doxycycline 100 MG capsule  Commonly known as:  MONODOX  Take 1 capsule (100 mg total) by mouth 2 (two) times daily.     ergocalciferol 50000 UNITS capsule  Commonly known as:  VITAMIN D2  Take 50,000 Units by mouth every 30 (thirty) days.     hydrALAZINE 25 MG tablet  Commonly known as:  APRESOLINE  Take 25 mg by mouth 2 (two) times daily.     labetalol 100 MG tablet  Commonly known as:  NORMODYNE  Take 100 mg by mouth 2 (two) times daily.     MULTIVITAMIN/IRON PO  Take by mouth.  omeprazole 20 MG capsule  Commonly known as:  PRILOSEC  TAKE ONE CAPSULE BY MOUTH DAILY     SE-TAN PLUS 162-115.2-1 MG Caps  Take 1 tablet by mouth daily. Take 1 Tab  daily           Objective:   Physical Exam BP 128/72 mmHg  Pulse 65  Temp(Src) 98 F (36.7 C) (Oral)  Ht 5\' 8"  (1.727 m)  Wt 170 lb (77.111 kg)  BMI 25.85 kg/m2  SpO2 98%  General:   Well developed, well nourished . NAD.  HEENT:  Normocephalic . Face symmetric, atraumatic Skin:  Left suprapubic area: He has a 1  3 cm area of induration, not fluctuancy, minimal tenderness, he has 3 mm opening with no discharge at the present time. Neurologic:  alert & oriented X3.  Speech normal, gait appropriate for age and unassisted Psych--  Cognition and judgment appear intact.  Cooperative with normal attention span and concentration.  Behavior appropriate. No anxious or depressed appearing.      Assessment & Plan:    Boil  No evidence of abscess formation, is not improving as I would like with Ceftin, will change to doxycycline, see instructions

## 2015-03-18 NOTE — Progress Notes (Signed)
Pre visit review using our clinic review tool, if applicable. No additional management support is needed unless otherwise documented below in the visit note. 

## 2015-03-18 NOTE — Patient Instructions (Addendum)
Stop Ceftin Start doxycycline Continue using warm compress Cover the area with an antibiotic ointment OTC Call if not gradually improving in the next 10 days Call anytime if worse, fever, chills

## 2015-03-24 DIAGNOSIS — I10 Essential (primary) hypertension: Secondary | ICD-10-CM | POA: Diagnosis not present

## 2015-03-24 DIAGNOSIS — N183 Chronic kidney disease, stage 3 (moderate): Secondary | ICD-10-CM | POA: Diagnosis not present

## 2015-03-24 LAB — CBC AND DIFFERENTIAL
HCT: 37 % — AB (ref 41–53)
Hemoglobin: 12.1 g/dL — AB (ref 13.5–17.5)
Platelets: 134 10*3/uL — AB (ref 150–399)
WBC: 4.1 10^3/mL

## 2015-03-24 LAB — BASIC METABOLIC PANEL
BUN: 35 mg/dL — AB (ref 4–21)
CREATININE: 2.3 mg/dL — AB (ref <=1.3)
Glucose: 93 mg/dL
Potassium: 4.9 mmol/L (ref 3.4–5.3)
Sodium: 141 mmol/L (ref 137–147)

## 2015-04-07 ENCOUNTER — Other Ambulatory Visit: Payer: Self-pay

## 2015-04-07 DIAGNOSIS — M4806 Spinal stenosis, lumbar region: Secondary | ICD-10-CM | POA: Diagnosis not present

## 2015-04-21 DIAGNOSIS — M4806 Spinal stenosis, lumbar region: Secondary | ICD-10-CM | POA: Diagnosis not present

## 2015-05-05 DIAGNOSIS — M4806 Spinal stenosis, lumbar region: Secondary | ICD-10-CM | POA: Diagnosis not present

## 2015-05-07 ENCOUNTER — Other Ambulatory Visit: Payer: Self-pay | Admitting: Specialist

## 2015-05-07 ENCOUNTER — Ambulatory Visit: Payer: Medicare Other | Admitting: Medical

## 2015-05-07 DIAGNOSIS — M545 Low back pain: Secondary | ICD-10-CM

## 2015-05-13 ENCOUNTER — Ambulatory Visit
Admission: RE | Admit: 2015-05-13 | Discharge: 2015-05-13 | Disposition: A | Discharge status: Home / Self Care | Payer: Medicare Other | Source: Ambulatory Visit | Attending: Specialist | Admitting: Specialist

## 2015-05-13 DIAGNOSIS — M5136 Other intervertebral disc degeneration, lumbar region: Secondary | ICD-10-CM | POA: Diagnosis not present

## 2015-05-13 DIAGNOSIS — M545 Low back pain: Secondary | ICD-10-CM

## 2015-05-13 DIAGNOSIS — C649 Malignant neoplasm of unspecified kidney, except renal pelvis: Secondary | ICD-10-CM | POA: Diagnosis not present

## 2015-05-13 DIAGNOSIS — M4806 Spinal stenosis, lumbar region: Secondary | ICD-10-CM | POA: Diagnosis not present

## 2015-05-13 DIAGNOSIS — M47817 Spondylosis without myelopathy or radiculopathy, lumbosacral region: Secondary | ICD-10-CM | POA: Diagnosis not present

## 2015-06-03 DIAGNOSIS — M4806 Spinal stenosis, lumbar region: Secondary | ICD-10-CM | POA: Diagnosis not present

## 2015-06-09 ENCOUNTER — Encounter: Payer: Self-pay | Admitting: Internal Medicine

## 2015-06-09 ENCOUNTER — Other Ambulatory Visit: Payer: Self-pay

## 2015-06-09 ENCOUNTER — Ambulatory Visit (INDEPENDENT_AMBULATORY_CARE_PROVIDER_SITE_OTHER): Payer: Medicare Other | Admitting: Internal Medicine

## 2015-06-09 VITALS — BP 126/78 | HR 60 | Temp 97.5°F | Ht 68.0 in | Wt 174.1 lb

## 2015-06-09 DIAGNOSIS — Z79899 Other long term (current) drug therapy: Secondary | ICD-10-CM

## 2015-06-09 DIAGNOSIS — L299 Pruritus, unspecified: Secondary | ICD-10-CM | POA: Diagnosis not present

## 2015-06-09 DIAGNOSIS — G629 Polyneuropathy, unspecified: Secondary | ICD-10-CM

## 2015-06-09 DIAGNOSIS — G589 Mononeuropathy, unspecified: Secondary | ICD-10-CM | POA: Diagnosis not present

## 2015-06-09 MED ORDER — GABAPENTIN 100 MG PO CAPS: 100.0000 mg | ORAL_CAPSULE | Freq: Two times a day (BID) | ORAL | Status: DC

## 2015-06-09 NOTE — Progress Notes (Signed)
Subjective:    Patient ID: [name], male    DOB: Nov 08, 1927, 81 y.o.   MRN: [id number]  DOS:  06/09/2015 Type of visit - description :  Interval history: Symptoms started a year ago, progressively getting worse: Feels like his legs "weight 500 pounds each", having a burning feeling at the bottom of the feet. He is actually taking Elavil as prescribed by orthopedic doctors mostly for itching on the legs which is now gone.  Also has been hoarse for a few weeks.    Review of Systems No lower extremity edema, currently without much back pain per se. No rash.   Past Medical History  Diagnosis Date  . Abdominal aortic aneurysm 09/2008    s/o endovascular repair and angiogram 10/04/08   . Bleeding ulcer 1997  . Leukopenia 2/11    thought to be from doxy  . Renal cell carcinoma     clear cell type dx 10/09, s/p excision 10-24-08  . Hypertension   . Renal insufficiency   . COPD (chronic obstructive pulmonary disease)   . Anemia     EGD Cscope 08-2012    Past Surgical History  Procedure Laterality Date  . Mohs surgery      1997 (lip), 2004  . Hemorrhoid surgery  1990  . Nephrectomy  2009    Open nephrectomy for cancer  . Abdominal aortic aneurysm repair  2009    EVAR    History   Social History  . Marital Status: Married    Spouse Name: N/A  . Number of Children: 3  . Years of Education: N/A   Occupational History  . retired    Social History Main Topics  . Smoking status: Former Smoker    Quit date: 12/28/2007  . Smokeless tobacco: Never Used     Comment: used to smoke 1.5 ppd  . Alcohol Use: No  . Drug Use: No  . Sexual Activity: Not on file   Other Topics Concern  . Not on file   Social History Narrative   Lives w/ wife who has dementia    Totally independent on ADL    Often came to office w/ his daughter July [phone]), his other daughter is  [name] , son is Derren                           Medication List       This list is accurate  as of: 06/09/15 11:59 PM.  Always use your most recent med list.               amLODipine 5 MG tablet  Commonly known as:  NORVASC  TAKE 1 TABLET BY MOUTH EVERY DAY     Calcium 200 MG Tabs  Take 1 tablet by mouth daily.     Casanthranol-Docusate Sodium 30-100 MG Caps  Take by mouth.     chlorthalidone 25 MG tablet  Commonly known as:  HYGROTON  Take 25 mg by mouth daily.     ergocalciferol 50000 UNITS capsule  Commonly known as:  VITAMIN D2  Take 50,000 Units by mouth every 30 (thirty) days.     gabapentin 100 MG capsule  Commonly known as:  NEURONTIN  Take 1 capsule (100 mg total) by mouth 2 (two) times daily.     hydrALAZINE 25 MG tablet  Commonly known as:  APRESOLINE  Take 25 mg by mouth 2 (two) times daily.  labetalol 100 MG tablet  Commonly known as:  NORMODYNE  Take 100 mg by mouth 2 (two) times daily.     MULTIVITAMIN/IRON PO  Take by mouth.     omeprazole 20 MG capsule  Commonly known as:  PRILOSEC  TAKE ONE CAPSULE BY MOUTH DAILY     SE-TAN PLUS 162-115.2-1 MG Caps  Take 1 tablet by mouth daily. Take 1 Tab  daily           Objective:   Physical Exam BP 126/78 mmHg  Pulse 60  Temp(Src) 97.5 F (36.4 C) (Oral)  Ht 5\' 8"  (1.727 m)  Wt 174 lb 2 oz (78.983 kg)  BMI 26.48 kg/m2  SpO2 97% General:   Well developed, well nourished . NAD.  HEENT:  Normocephalic . Face symmetric, atraumatic MSK:- No TTP at the back Lower extremities: No edema, + femoral and pedal pulses bilaterally, no rash Skin: Not pale. Not jaundice Neurologic:  alert & oriented X3.  Speech normal, gait appropriate for age and unassisted Pinprick examination - normal bilaterally Psych--  Cognition and judgment appear intact.  Cooperative with normal attention span and concentration.  Behavior appropriate. No anxious or depressed appearing.        Assessment & Plan:    Today , I spent more than  25  min with the patient and his daughter: >50% of the time  counseling regards the new dx of neuropathy , multiple questions answered to the best of my ability

## 2015-06-09 NOTE — Patient Instructions (Signed)
Get your labs done  Gabapentin 100 mg: 1 tablet every morning for one week, then one tablet twice a day  Discontinue amitriptyline

## 2015-06-09 NOTE — Progress Notes (Signed)
Pre visit review using our clinic review tool, if applicable. No additional management support is needed unless otherwise documented below in the visit note. 

## 2015-06-10 DIAGNOSIS — G629 Polyneuropathy, unspecified: Secondary | ICD-10-CM | POA: Insufficient documentation

## 2015-06-10 LAB — SEDIMENTATION RATE: Sed Rate: 11 mm/hr (ref 0–22)

## 2015-06-10 LAB — HEMOGLOBIN A1C: HEMOGLOBIN A1C: 5.1 % (ref 4.6–6.5)

## 2015-06-10 LAB — VITAMIN B12: VITAMIN B 12: 500 pg/mL (ref 211–911)

## 2015-06-10 LAB — FOLATE: Folate: >24.8 ng/mL (ref >5.9)

## 2015-06-10 NOTE — Assessment & Plan Note (Signed)
Neuropathy, Burning feeling on the bottom of the foot likely neuropathy. He does have a foraminal stenosis, status post a spinal shots without much improvement. On elavil reportedly for a   sensation of itching in the legs that is now better. Initially prescribed by orthopedic surgery Plan: Folic acid, B01, vitamin D, SPEP, sedimentation rate, A1c. Discontinue Elavil Start a low dose of gabapentin 100 mg bid , history of chronic kidney disease.  NCS

## 2015-06-11 LAB — PROTEIN ELECTROPHORESIS, SERUM
Albumin ELP: 4.1 g/dL (ref 3.8–4.8)
Alpha-1-Globulin: 0.3 g/dL (ref 0.2–0.3)
Alpha-2-Globulin: 0.7 g/dL (ref 0.5–0.9)
BETA GLOBULIN: 0.4 g/dL (ref 0.4–0.6)
Beta 2: 0.3 g/dL (ref 0.2–0.5)
Gamma Globulin: 0.9 g/dL (ref 0.8–1.7)
Total Protein, Serum Electrophoresis: 6.6 g/dL (ref 6.1–8.1)

## 2015-06-12 LAB — VITAMIN D 1,25 DIHYDROXY
VITAMIN D 1, 25 (OH) TOTAL: 30 pg/mL (ref 18–72)
Vitamin D2 1, 25 (OH)2: 12 pg/mL
Vitamin D3 1, 25 (OH)2: 18 pg/mL

## 2015-06-16 ENCOUNTER — Ambulatory Visit (INDEPENDENT_AMBULATORY_CARE_PROVIDER_SITE_OTHER): Payer: Medicare Other | Admitting: Medical

## 2015-06-16 VITALS — BP 143/75 | HR 80 | Temp 98.0°F | Ht 68.0 in | Wt 172.2 lb

## 2015-06-16 DIAGNOSIS — J0100 Acute maxillary sinusitis, unspecified: Secondary | ICD-10-CM

## 2015-06-16 MED ORDER — BENZONATATE 100 MG PO CAPS: 100.0000 mg | ORAL_CAPSULE | Freq: Three times a day (TID) | ORAL | Status: DC | PRN

## 2015-06-16 MED ORDER — MOMETASONE FUROATE 50 MCG/ACT NA SUSP: NASAL | Status: DC

## 2015-06-16 MED ORDER — CEFDINIR 300 MG PO CAPS: 300.0000 mg | ORAL_CAPSULE | Freq: Two times a day (BID) | ORAL | Status: DC

## 2015-06-16 NOTE — Progress Notes (Signed)
Pre visit review using our clinic review tool, if applicable. No additional management support is needed unless otherwise documented below in the visit note. 

## 2015-06-16 NOTE — Patient Instructions (Signed)
Sinusitis with possible bronchitis. Rx cefdnir antibiotic. Benzonatate for cough and nasonex for nasal congestion.(nasal spray may help with eustachian tube pressure as well). Follow up in 7-10 days any persisting signs or symptoms or as needed if worsens.  If worses would get cbc and cxr.

## 2015-06-16 NOTE — Progress Notes (Signed)
Subjective:    Patient ID: [name], male    DOB: 1927-08-26, 81 y.o.   MRN: [id number]  HPI  Pt in feeling some head/nasal congestion recently. Some hoarse voice as well. Pt states this going on for a month. States chest congestion going on for over a month. Some sinus pressure and productive cough. Not getting better symptoms are persisting/lingering.    Review of Systems  Constitutional: Negative for fever, chills and fatigue.  HENT: Positive for congestion, postnasal drip and sinus pressure. Negative for trouble swallowing.   Respiratory: Positive for cough. Negative for chest tightness, shortness of breath and wheezing.   Cardiovascular: Negative for chest pain and palpitations.  Musculoskeletal: Negative for back pain.  Skin: Negative for rash.  Hematological: Negative for adenopathy. Does not bruise/bleed easily.  Psychiatric/Behavioral: Negative for behavioral problems and confusion.    Past Medical History  Diagnosis Date  . Abdominal aortic aneurysm 09/2008    s/o endovascular repair and angiogram 10/04/08   . Bleeding ulcer 1997  . Leukopenia 2/11    thought to be from doxy  . Renal cell carcinoma     clear cell type dx 10/09, s/p excision 10-24-08  . Hypertension   . Renal insufficiency   . COPD (chronic obstructive pulmonary disease)   . Anemia     EGD Cscope 08-2012    History   Social History  . Marital Status: Married    Spouse Name: N/A  . Number of Children: 3  . Years of Education: N/A   Occupational History  . retired    Social History Main Topics  . Smoking status: Former Smoker    Quit date: 12/28/2007  . Smokeless tobacco: Never Used     Comment: used to smoke 1.5 ppd  . Alcohol Use: No  . Drug Use: No  . Sexual Activity: Not on file   Other Topics Concern  . Not on file   Social History Narrative   Lives w/ wife who has dementia    Totally independent on ADL    Often came to office w/ his daughter July [phone]), his other  daughter is  [name] , son is Mert                       Past Surgical History  Procedure Laterality Date  . Mohs surgery      1997 (lip), 2004  . Hemorrhoid surgery  1990  . Nephrectomy  2009    Open nephrectomy for cancer  . Abdominal aortic aneurysm repair  2009    EVAR    Family History  Problem Relation Age of Onset  . Diabetes Father     ?  . Stroke Father   . Hypertension Father   . Colon cancer Neg Hx   . Prostate cancer Neg Hx     Allergies  Allergen Reactions  . Baclofen Other (See Comments)    Unable to walk per Pt's family     Current Outpatient Prescriptions on File Prior to Visit  Medication Sig Dispense Refill  . Casanthranol-Docusate Sodium 30-100 MG CAPS Take by mouth.      . chlorthalidone (HYGROTON) 25 MG tablet Take 25 mg by mouth daily.    . ergocalciferol (VITAMIN D2) 50000 UNITS capsule Take 50,000 Units by mouth every 30 (thirty) days.    . FeFum-FePo-FA-B Cmp-C-Zn-Mn-Cu (SE-TAN PLUS) 162-115.2-1 MG CAPS Take 1 tablet by mouth daily. Take 1 Tab  daily    .  gabapentin (NEURONTIN) 100 MG capsule Take 1 capsule (100 mg total) by mouth 2 (two) times daily. 60 capsule 3  . hydrALAZINE (APRESOLINE) 25 MG tablet Take 25 mg by mouth 2 (two) times daily.    Marland Kitchen labetalol (NORMODYNE) 100 MG tablet Take 100 mg by mouth 2 (two) times daily.    . Multiple Vitamins-Iron (MULTIVITAMIN/IRON PO) Take by mouth.       No current facility-administered medications on file prior to visit.    BP 153/76 mmHg  Pulse 80  Temp(Src) 98 F (36.7 C) (Oral)  Ht 5\' 8"  (1.727 m)  Wt 172 lb 3.2 oz (78.109 kg)  BMI 26.19 kg/m2  SpO2 95%       Objective:   Physical Exam  General  Mental Status - Alert. General Appearance - Well groomed. Not in acute distress.  Skin Rashes- No Rashes.  HEENT Head- Normal. Ear Auditory Canal - Left- Normal. Right - Normal.Tympanic Membrane- Left- Normal. Right- Normal. Eye Sclera/Conjunctiva- Left- Normal. Right-  Normal. Nose & Sinuses Nasal Mucosa- Left-  Boggy and Congested. Right-  Boggy and  Congested.Bilateral maxillary and frontal sinus pressure. Mouth & Throat Lips: Upper Lip- Normal: no dryness, cracking, pallor, cyanosis, or vesicular eruption. Lower Lip-Normal: no dryness, cracking, pallor, cyanosis or vesicular eruption. Buccal Mucosa- Bilateral- No Aphthous ulcers. Oropharynx- No Discharge or Erythema. +pnd. Tonsils: Characteristics- Bilateral- No Erythema or Congestion. Size/Enlargement- Bilateral- No enlargement. Discharge- bilateral-None.  Neck Neck- Supple. No Masses.   Chest and Lung Exam Auscultation: Breath Sounds:-Clear even and unlabored.  Cardiovascular Auscultation:Rythm- Regular, rate and rhythm. Murmurs & Other Heart Sounds:Ausculatation of the heart reveal- No Murmurs.  Lymphatic Head & Neck General Head & Neck Lymphatics: Bilateral: Description- No Localized lymphadenopathy.       Assessment & Plan:  Sinusitis with possible bronchitis. Rx cefdnir antibiotic. Benzonatate for cough and nasonex for nasal congestion.(nasal spray may help with eustachian tube pressure as well). Follow up in 7-10 days any persisting signs or symptoms or as needed if worsens.  If worses would get cbc and cxr.

## 2015-06-23 DIAGNOSIS — E559 Vitamin D deficiency, unspecified: Secondary | ICD-10-CM | POA: Diagnosis not present

## 2015-06-23 DIAGNOSIS — N183 Chronic kidney disease, stage 3 (moderate): Secondary | ICD-10-CM | POA: Diagnosis not present

## 2015-06-23 DIAGNOSIS — M4806 Spinal stenosis, lumbar region: Secondary | ICD-10-CM | POA: Diagnosis not present

## 2015-06-23 DIAGNOSIS — I10 Essential (primary) hypertension: Secondary | ICD-10-CM | POA: Diagnosis not present

## 2015-06-23 LAB — BASIC METABOLIC PANEL
BUN: 24 mg/dL — AB (ref 4–21)
Creatinine: 2.1 mg/dL — AB (ref <=1.3)
GLUCOSE: 94 mg/dL
POTASSIUM: 5 mmol/L (ref 3.4–5.3)
Sodium: 143 mmol/L (ref 137–147)

## 2015-06-24 ENCOUNTER — Telehealth: Payer: Self-pay | Admitting: Internal Medicine

## 2015-06-24 DIAGNOSIS — G629 Polyneuropathy, unspecified: Secondary | ICD-10-CM

## 2015-06-24 NOTE — Telephone Encounter (Signed)
Spoke with Anderson Malta, informed her that nerve conduction study was placed on 06/10/2015. Anderson Malta informed that nerve conduction study has to be placed as a referral to neurology and in comments:"nerve conduction study." Informed Anderson Malta that we had no clue that we had to place order as referral, but that I would place an urgent referral to neurology for Pt. Anderson Malta informed that neurology would try to work Pt in today.

## 2015-06-24 NOTE — Telephone Encounter (Signed)
Caller name: Secundino Ginger Relationship to patient: daughter Can be reached: [phone]  Reason for call: Pts daughter called in very upset with our practice. She states that 2 weeks ago she was in with her father and we said we were referring him to Neurology for a motor nerve conduction test. She said they were here again last week and were told that Neurology would contact them to schedule appt. At some point she called [place] and was given a phone # for [facility]. Jen and I looked thru pts chart and found an order for the motor nerve conduction test but no referral was entered. I notified pts daughter that perhaps the order was sent in from the clinical staff. She was upset to the point of crying that her father is going downhill and she is spending time calling back and forth to doctors offices and needs to be taking care of him. Please enter referral ASAP as she states [facility] can get pt in today if we send everything right away. [name] would like a call back to notify her the referral was sent. She would also like an explanation as to what happened.  Please sent to: Attn: Erica @ [facility] Ph#: [phone] Fax#: [phone]

## 2015-06-24 NOTE — Telephone Encounter (Signed)
noted 

## 2015-06-26 ENCOUNTER — Other Ambulatory Visit (HOSPITAL_BASED_OUTPATIENT_CLINIC_OR_DEPARTMENT_OTHER): Payer: Self-pay | Admitting: Internal Medicine

## 2015-06-26 DIAGNOSIS — N184 Chronic kidney disease, stage 4 (severe): Secondary | ICD-10-CM | POA: Diagnosis not present

## 2015-06-26 DIAGNOSIS — R0989 Other specified symptoms and signs involving the circulatory and respiratory systems: Secondary | ICD-10-CM

## 2015-06-26 DIAGNOSIS — I10 Essential (primary) hypertension: Secondary | ICD-10-CM | POA: Diagnosis not present

## 2015-06-26 DIAGNOSIS — D649 Anemia, unspecified: Secondary | ICD-10-CM | POA: Diagnosis not present

## 2015-06-26 DIAGNOSIS — N390 Urinary tract infection, site not specified: Secondary | ICD-10-CM | POA: Diagnosis not present

## 2015-06-27 ENCOUNTER — Ambulatory Visit (HOSPITAL_BASED_OUTPATIENT_CLINIC_OR_DEPARTMENT_OTHER)
Admission: RE | Admit: 2015-06-27 | Discharge: 2015-06-27 | Disposition: A | Discharge status: Home / Self Care | Payer: Medicare Other | Source: Ambulatory Visit | Attending: Internal Medicine | Admitting: Internal Medicine

## 2015-06-27 ENCOUNTER — Other Ambulatory Visit: Payer: Self-pay | Admitting: *Deleted

## 2015-06-27 DIAGNOSIS — R0989 Other specified symptoms and signs involving the circulatory and respiratory systems: Secondary | ICD-10-CM | POA: Insufficient documentation

## 2015-06-27 DIAGNOSIS — R05 Cough: Secondary | ICD-10-CM | POA: Insufficient documentation

## 2015-06-27 DIAGNOSIS — G609 Hereditary and idiopathic neuropathy, unspecified: Secondary | ICD-10-CM

## 2015-07-03 ENCOUNTER — Encounter: Payer: Self-pay | Admitting: Rehabilitation

## 2015-07-03 ENCOUNTER — Ambulatory Visit: Payer: Medicare Other | Attending: Specialist | Admitting: Rehabilitation

## 2015-07-03 DIAGNOSIS — M4806 Spinal stenosis, lumbar region: Secondary | ICD-10-CM | POA: Insufficient documentation

## 2015-07-03 DIAGNOSIS — R262 Difficulty in walking, not elsewhere classified: Secondary | ICD-10-CM | POA: Diagnosis not present

## 2015-07-03 DIAGNOSIS — M48061 Spinal stenosis, lumbar region without neurogenic claudication: Secondary | ICD-10-CM

## 2015-07-03 NOTE — Therapy (Signed)
[facility] [address]  [place], [place], [address] Phone: [phone]   Fax:  [phone]  Physical Therapy Evaluation  Patient Details  Name: [name] MRN: [id number] Date of Birth: 1927/06/14 Referring Provider:  Jessy Oto, MD  Encounter Date: 07/03/2015      PT End of Session - 07/03/15 1528    Visit Number 1   Number of Visits 12   Date for PT Re-Evaluation 08/14/15   PT Start Time 0093   PT Stop Time 1530   PT Time Calculation (min) 45 min   Activity Tolerance Patient tolerated treatment well;Patient limited by fatigue      Past Medical History  Diagnosis Date  . Abdominal aortic aneurysm 09/2008    s/o endovascular repair and angiogram 10/04/08   . Bleeding ulcer 1997  . Leukopenia 2/11    thought to be from doxy  . Renal cell carcinoma     clear cell type dx 10/09, s/p excision 10-24-08  . Hypertension   . Renal insufficiency   . COPD (chronic obstructive pulmonary disease)   . Anemia     EGD Cscope 08-2012  . CKD (chronic kidney disease), stage IV     Stable, Dr. Dimas Aguas  . Solitary left kidney     W/ chronic tubule-interstitial damage, Dr. Dimas Aguas  . Spinal stenosis     causing LE weakness-persistant, being worked up by Hydrographic surveyor and neurologist  . H/O hyperkalemia     Stable    Past Surgical History  Procedure Laterality Date  . Mohs surgery      1997 (lip), 2004  . Hemorrhoid surgery  1990  . Nephrectomy  2009    Open nephrectomy for cancer  . Abdominal aortic aneurysm repair  2009    EVAR    There were no vitals filed for this visit.  Visit Diagnosis:  Spinal stenosis of lumbar region - Plan: PT plan of care cert/re-cert  Difficulty walking - Plan: PT plan of care cert/re-cert      Subjective Assessment - 07/03/15 1443    Subjective Pt presents with increased symptoms of heaviness, tiredness, and weakness due to spinal stenosis the past couple of  months.  getting a nerve conduction study done on tuesday to determine if the symptoms are more claudication vs neuropathy.  walking has become challenging.  reports is unable to go more than 1 block or 5 minutes anymore.  Does not use the cane.  Reports the cane would not help much with the endurance.  Has a walker that he is okay with using now if needed.  Feels like he could get off balance at home and when walking in the community.  Maybe a little worse compared to last time.     Limitations Standing;Walking   How long can you walk comfortably? 102minutes   Diagnostic tests MRI in may showing mild stenosis L3/4, 4/5, 5/1   Patient Stated Goals improve LE endurance and strength; improve sit to stand and walking time.    Currently in Pain? No/denies            [facility] PT Assessment - 07/03/15 0001    Assessment   Medical Diagnosis lumbar spinal stenosis; also s/p stent placement at the heart   Precautions   Precaution Comments MD referral stating no lumbar extension exercises   Restrictions   Weight Bearing Restrictions No   Balance Screen   Has the patient fallen in the past  6 months No   Has the patient had a decrease in activity level because of a fear of falling?  Yes   Is the patient reluctant to leave their home because of a fear of falling?  Yes   Home Environment   Living Environment Private residence   Living Arrangements Spouse/significant other  has to take care of bed ridden wife   Type of [place] Two level;Able to live on main level with bedroom/bathroom   [place] - 2 wheels;[place] - single point   Prior Function   Level of Independence Independent   Leisure not getting to the gym as much as he used to.  Can only walk 1/2 mile and used to walk 2 miles QD   Cognition   Overall Cognitive Status Within Functional Limits for tasks assessed   Observation/Other Assessments   Focus on Therapeutic Outcomes (FOTO)  37% limited   Sensation    Additional Comments reported okay per patient at MD appt   ROM / Strength   AROM / PROM / Strength Strength   Strength   Strength Assessment Site Hip;Knee;Ankle   Right/Left Hip Right;Left   Right Hip Flexion 4-/5  with quad lag present   Right Hip ABduction 4-/5   Left Hip Flexion 4+/5   Left Hip ABduction 4+/5   Right/Left Knee Right;Left   Right Knee Flexion 5/5   Right Knee Extension 5/5   Left Knee Flexion 5/5   Left Knee Extension 5/5   Flexibility   Soft Tissue Assessment /Muscle Length yes   Hamstrings still very tight bilaterally   Transfers   Five time sit to stand comments  15"; needing arms straight out in front and starting to have difficulty at the end of the 5th one   Ambulation/Gait   Gait Comments ambulates forward flexed, decreased TKE bilaterally, waddling gait   6 Minute Walk- Baseline   6 Minute Walk- Baseline yes   HR (bpm) 65   02 Sat (%RA) 95 %   Perceived Rate of Exertion (Borg) 17- Very hard   6 minute walk test results    Aerobic Endurance Distance Walked 656   Endurance additional comments only able to walk x 4:42 due to legs   Standardized Balance Assessment   Standardized Balance Assessment Berg Balance Test   Berg Balance Test   Sit to Stand Able to stand without using hands and stabilize independently   Standing Unsupported Able to stand safely 2 minutes   Sitting with Back Unsupported but Feet Supported on Floor or Stool Able to sit safely and securely 2 minutes   Stand to Sit Sits safely with minimal use of hands   Transfers Able to transfer safely, minor use of hands   Standing Unsupported with Eyes Closed Able to stand 10 seconds with supervision   Standing Ubsupported with Feet Together Able to place feet together independently and stand for 1 minute with supervision   From Standing, Reach Forward with Outstretched Arm Can reach confidently >25 cm (10")   From Standing Position, Pick up Object from Floor Able to pick up shoe safely and  easily   From Standing Position, Turn to Look Behind Over each Shoulder Turn sideways only but maintains balance   Turn 360 Degrees Able to turn 360 degrees safely in 4 seconds or less   Standing Unsupported, Alternately Place Feet on Step/Stool Able to stand independently and safely and complete 8 steps in 20 seconds  Standing Unsupported, One Foot in Front Needs help to step but can hold 15 seconds   Standing on One Leg Able to lift leg independently and hold equal to or more than 3 seconds   Total Score 47       Eval performed Bike x 46min level 2 Discussed use of RW to improve gait endurance and feelings of unsteadiness with walking longer distances                    PT Education - Jul 16, 2015 1525    Education provided Yes   Education Details POC   Person(s) Educated Patient;Child(ren)   Methods Explanation   Comprehension Verbalized understanding             PT Long Term Goals - July 16, 2015 1536    PT LONG TERM GOAL #1   Title Pt will improve 9min walk time to being able to walk the complete 6 minutes with at least 900 feet completed   Time 6   Period Weeks   Status New   PT LONG TERM GOAL #2   Title pt will demonstrate safe RW use in clinic to improve community ambulation endurance   Time 6   Period Weeks   Status New   PT LONG TERM GOAL #3   Title pt will improve R hip strength to 4/5 or greater   Time 6   Period Weeks   Status New   PT LONG TERM GOAL #4   Title pt will return to walking at least 1.5 miles at the gym   Time 6   Period Weeks   Status New               Plan - 07-16-15 1529    Clinical Impression Statement Pt presents after discharge a few months ago with decreased LE endurance and strength due to worsening of lumbar stenosis vs claudication in the LEs.  BERG has not changed by more than 2 points since last PT visits.  44min walk test has declined due to low endurance.  Also now exhibiting some new hip weakness R>L   Pt  will benefit from skilled therapeutic intervention in order to improve on the following deficits Abnormal gait;Decreased activity tolerance;Difficulty walking   Rehab Potential Good   PT Frequency 2x / week   PT Duration 6 weeks   PT Treatment/Interventions Gait training;Therapeutic activities;Therapeutic exercise;Balance training;Neuromuscular re-education   PT Next Visit Plan lumbar stenosis POC; bike, balance as needed   Consulted and Agree with Plan of Care Patient;Family member/caregiver          G-Codes - 07/16/15 1442    Functional Assessment Tool Used FOTO: 37% limited   Functional Limitation Mobility: Walking and moving around   Mobility: Walking and Moving Around Current Status [phone]) At least 20 percent but less than 40 percent impaired, limited or restricted   Mobility: Walking and Moving Around Goal Status [phone]) At least 20 percent but less than 40 percent impaired, limited or restricted       Problem List Patient Active Problem List   Diagnosis Date Noted  . Neuropathy 06/10/2015  . Pedal edema 01/30/2015  . Hip pain, chronic 01/30/2015  . Back pain 01/30/2015  . Cramps of lower extremity 01/30/2015  . Aftercare following surgery of the circulatory system, [place] 08/05/2014  . COPD exacerbation 01/10/2014  . Annual physical exam 10/19/2011  . DJD (degenerative joint disease) 12/14/2010  . Anemia 06/06/2009  . COPD (chronic obstructive pulmonary  disease) 06/06/2009  . RENAL CELL CANCER 12/25/2008  . HTN (hypertension) 12/25/2008  . AAA (abdominal aortic aneurysm) without rupture 12/25/2008  . CKD (chronic kidney disease) 12/25/2008  . HYPERCHOLESTEROLEMIA 03/01/2008  . GERD 03/01/2008  . ACNE ROSACEA 11/29/2007  . DIVERTICULOSIS, COLON 04/12/2007    Stark Bray 07/03/2015, 3:42 PM  [facility] [address]  [place], [place], [address] Phone: [phone]   Fax:  [phone]

## 2015-07-07 ENCOUNTER — Ambulatory Visit: Payer: Medicare Other | Admitting: Physical Therapy

## 2015-07-07 DIAGNOSIS — M4806 Spinal stenosis, lumbar region: Secondary | ICD-10-CM | POA: Diagnosis not present

## 2015-07-07 DIAGNOSIS — M48061 Spinal stenosis, lumbar region without neurogenic claudication: Secondary | ICD-10-CM

## 2015-07-07 DIAGNOSIS — R262 Difficulty in walking, not elsewhere classified: Secondary | ICD-10-CM | POA: Diagnosis not present

## 2015-07-07 NOTE — Therapy (Signed)
[facility] [address]  [place], [place], [address] Phone: [phone]   Fax:  [phone]  Physical Therapy Treatment  Patient Details  Name: [name] MRN: [id number] Date of Birth: 1927/05/12 Referring Provider:  Jessy Oto, MD  Encounter Date: 07/07/2015      PT End of Session - 07/07/15 1406    Visit Number 2   Number of Visits 12   Date for PT Re-Evaluation 08/14/15   PT Start Time 11      Past Medical History  Diagnosis Date  . Abdominal aortic aneurysm 09/2008    s/o endovascular repair and angiogram 10/04/08   . Bleeding ulcer 1997  . Leukopenia 2/11    thought to be from doxy  . Renal cell carcinoma     clear cell type dx 10/09, s/p excision 10-24-08  . Hypertension   . Renal insufficiency   . COPD (chronic obstructive pulmonary disease)   . Anemia     EGD Cscope 08-2012  . CKD (chronic kidney disease), stage IV     Stable, Dr. Dimas Aguas  . Solitary left kidney     W/ chronic tubule-interstitial damage, Dr. Dimas Aguas  . Spinal stenosis     causing LE weakness-persistant, being worked up by Hydrographic surveyor and neurologist  . H/O hyperkalemia     Stable    Past Surgical History  Procedure Laterality Date  . Mohs surgery      1997 (lip), 2004  . Hemorrhoid surgery  1990  . Nephrectomy  2009    Open nephrectomy for cancer  . Abdominal aortic aneurysm repair  2009    EVAR    There were no vitals filed for this visit.  Visit Diagnosis:  No diagnosis found.      Subjective Assessment - 07/07/15 1407    Subjective Patient denies pain but does report some "burning" in his feet "nearly all the time". Patient verbalizing reluctance to use AD (cane or RW) as discussed with PT at last visit.   Currently in Pain? No/denies        TODAY'S TREATMENT  There Ex: NuStep - lvl 3 x 6 min Supine SLR with opposite knee flexed x10 bilateral (attempted 2# cuff weight but patient  unable to tolerate added resistance and demonstrate proper technique) Supine hooklying marches x10 Bridging x10 Seated low row with red TB 2 x10 with emphasis on T/L extension for good posture   Neuro - sit to/from stand w/o UE assistance x8 with focus on eccentric control   Gait training - Provided training in safe use of RW with cues for upright posture, good proximity of RW and step through gait pattern.          PT Long Term Goals - 07/03/15 1536    PT LONG TERM GOAL #1   Title Pt will improve 52min walk time to being able to walk the complete 6 minutes with at least 900 feet completed   Time 6   Period Weeks   Status New   PT LONG TERM GOAL #2   Title pt will demonstrate safe RW use in clinic to improve community ambulation endurance   Time 6   Period Weeks   Status New   PT LONG TERM GOAL #3   Title pt will improve R hip strength to 4/5 or greater   Time 6   Period Weeks   Status New   PT LONG TERM GOAL #4  Title pt will return to walking at least 1.5 miles at the gym   Time 6   Period Weeks   Status New          Problem List Patient Active Problem List   Diagnosis Date Noted  . Neuropathy 06/10/2015  . Pedal edema 01/30/2015  . Hip pain, chronic 01/30/2015  . Back pain 01/30/2015  . Cramps of lower extremity 01/30/2015  . Aftercare following surgery of the circulatory system, [place] 08/05/2014  . COPD exacerbation 01/10/2014  . Annual physical exam 10/19/2011  . DJD (degenerative joint disease) 12/14/2010  . Anemia 06/06/2009  . COPD (chronic obstructive pulmonary disease) 06/06/2009  . RENAL CELL CANCER 12/25/2008  . HTN (hypertension) 12/25/2008  . AAA (abdominal aortic aneurysm) without rupture 12/25/2008  . CKD (chronic kidney disease) 12/25/2008  . HYPERCHOLESTEROLEMIA 03/01/2008  . GERD 03/01/2008  . ACNE ROSACEA 11/29/2007  . DIVERTICULOSIS, COLON 04/12/2007    Percival Spanish, PT, MPT 07/07/2015, 2:25 PM  [facility] [address]  [place], [place], [address] Phone: [phone]   Fax:  [phone]

## 2015-07-08 ENCOUNTER — Ambulatory Visit (INDEPENDENT_AMBULATORY_CARE_PROVIDER_SITE_OTHER): Payer: Medicare Other | Admitting: Neurology

## 2015-07-08 DIAGNOSIS — G609 Hereditary and idiopathic neuropathy, unspecified: Secondary | ICD-10-CM | POA: Diagnosis not present

## 2015-07-08 NOTE — Procedures (Signed)
[facility]  [place], [place]  [place], [place] [address] Tel: [phone] Fax:  [phone] Test Date:  07/08/2015  Patient: [name] DOB: [birth year] Physician: Narda Amber, DO  Sex: Male Height: 5\' 9"  Ref Phys: Kathlene November, M.D.  ID#: [id number] Temp: 34.3C Technician: Jerilynn Mages. Dean   Patient Complaints: This is a 81 year old gentleman presenting for evaluation of bilateral feet painful paresthesias and heavy sensation of the legs.   NCV & EMG Findings: Extensive electrodiagnostic testing of the right lower extremity and additional studies of the left shows:  1. Bilateral sural and superficial peroneal sensory responses are absent. 2. Bilateral peroneal motor responses coding at the extensor digitorum brevis are essentially absent; however, when recording at the tibialis anterior, motor responses are low normal. Bilateral tibial motor responses, markedly reduced. 3. Bilateral H reflex studies are prolonged. 4. Chronic motor axon loss changes are seen affecting the muscles below the knees bilaterally, without accompanied active denervation.  Impression: 1. The electrophysiologic findings are most consistent with a generalized sensorimotor polyneuropathy, axon loss in type, affecting the lower extremities. Overall, these findings are moderate to severe in degree electrically. 2. There is no evidence of a superimposed lumbosacral radiculopathy affecting the lower extremities.   ___________________________ Narda Amber, DO    Nerve Conduction Studies Anti Sensory Summary Table   Site NR Peak (ms) Norm Peak (ms) P-T Amp (V) Norm P-T Amp  Left Sup Peroneal Anti Sensory (Ant Lat Mall)  12 cm NR  <4.6  >3  Right Sup Peroneal Anti Sensory (Ant Lat Mall)  12 cm NR  <4.6  >3  Left Sural Anti Sensory (Lat Mall)  Calf NR  <4.6  >3  Right Sural Anti Sensory (Lat Mall)  Calf NR 0.9 <4.6 18.0 >3   Motor Summary Table  Site NR Onset (ms) Norm Onset (ms) O-P Amp (mV)  Norm O-P Amp Site1 Site2 Delta-0 (ms) Dist (cm) Vel (m/s) Norm Vel (m/s)  Left Peroneal Motor (Ext Dig Brev)  Ankle    4.8 <6.0 0.7 >2.5 B Fib Ankle 7.6 33.0 43 >40  B Fib    12.4  0.4  Poplt B Fib 1.8 10.0 56 >40  Poplt    14.2  0.4         Right Peroneal Motor (Ext Dig Brev)  Ankle NR  <6.0  >2.5 B Fib Ankle  0.0  >40  B Fib NR     Poplt B Fib  0.0  >40  Poplt NR            Left Peroneal TA Motor (Tib Ant)  Fib Head    4.3 <4.5 3.0 >3 Poplit Fib Head 2.2 10.0 45 >40  Poplit    6.5  2.8         Right Peroneal TA Motor (Tib Ant)  Fib Head    3.2 <4.5 3.3 >3 Poplit Fib Head 2.3 10.0 43 >40  Poplit    5.5  3.0         Left Tibial Motor (Abd Hall Brev)  Ankle    4.1 <6.0 1.3 >4 Knee Ankle 9.1 39.0 43 >40  Knee    13.2  1.1         Right Tibial Motor (Abd Hall Brev)  Ankle    3.6 <6.0 0.9 >4 Knee Ankle 10.4 43.0 41 >40  Knee    14.0  0.8          F Wave Studies  NR F-Lat (  ms) Lat Norm (ms) L-R F-Lat (ms)  Left Tibial (Mrkrs) (Abd Hallucis)     59.39 <55 0.00  Right Tibial (Mrkrs) (Abd Hallucis)     59.39 <55 0.00   H Reflex Studies  NR H-Lat (ms) Lat Norm (ms) L-R H-Lat (ms)  Left Tibial (Gastroc)     37.28 <35 0.00  Right Tibial (Gastroc)     37.28 <35 0.00   EMG  Side Muscle Ins Act Fibs Psw Fasc Number Recrt Dur Dur. Amp Amp. Poly Poly. Comment  Left Gastroc Nml Nml Nml Nml 1- Mod-R Some 1+ Few 1+ Nml Nml N/A  Left AntTibialis Nml Nml Nml Nml 1- Mod-R Some 1+ Some 1+ Nml Nml N/A  Left RectFemoris Nml Nml Nml Nml Nml Nml Nml Nml Nml Nml Nml Nml N/A  Right Gastroc Nml Nml Nml Nml 1- Mod-R Some 1+ Nml Nml Nml Nml N/A  Right Flex Dig Long Nml Nml Nml Nml 1- Rapid Some 1+ Some 1+ Nml Nml N/A  Right AntTibialis Nml Nml Nml Nml 1- Mod-R Some 1+ Some 1+ Nml Nml N/A  Right RectFemoris Nml Nml Nml Nml Nml Nml Nml Nml Nml Nml Nml Nml N/A  Right BicepsFemS Nml Nml Nml Nml Nml Nml Nml Nml Nml Nml Nml Nml N/A  Right GluteusMed Nml Nml Nml Nml Nml Nml Nml Nml Nml Nml Nml Nml N/A       Waveforms:

## 2015-07-09 ENCOUNTER — Telehealth: Payer: Self-pay | Admitting: Internal Medicine

## 2015-07-09 ENCOUNTER — Ambulatory Visit: Payer: Medicare Other | Admitting: Rehabilitation

## 2015-07-09 DIAGNOSIS — R262 Difficulty in walking, not elsewhere classified: Secondary | ICD-10-CM | POA: Diagnosis not present

## 2015-07-09 DIAGNOSIS — M4806 Spinal stenosis, lumbar region: Secondary | ICD-10-CM | POA: Diagnosis not present

## 2015-07-09 DIAGNOSIS — M48061 Spinal stenosis, lumbar region without neurogenic claudication: Secondary | ICD-10-CM

## 2015-07-09 NOTE — Therapy (Signed)
[facility] [address]  [place], [place], [address] Phone: [phone]   Fax:  [phone]  Physical Therapy Treatment  Patient Details  Name: [name] MRN: [id number] Date of Birth: 11-Apr-1927 Referring Provider:  Jessy Oto, MD  Encounter Date: 07/09/2015      PT End of Session - 07/09/15 1359    Visit Number 3   Number of Visits 12   Date for PT Re-Evaluation 08/14/15   PT Start Time 1400   PT Stop Time 1439   PT Time Calculation (min) 39 min      Past Medical History  Diagnosis Date  . Abdominal aortic aneurysm 09/2008    s/o endovascular repair and angiogram 10/04/08   . Bleeding ulcer 1997  . Leukopenia 2/11    thought to be from doxy  . Renal cell carcinoma     clear cell type dx 10/09, s/p excision 10-24-08  . Hypertension   . Renal insufficiency   . COPD (chronic obstructive pulmonary disease)   . Anemia     EGD Cscope 08-2012  . CKD (chronic kidney disease), stage IV     Stable, Dr. Dimas Aguas  . Solitary left kidney     W/ chronic tubule-interstitial damage, Dr. Dimas Aguas  . Spinal stenosis     causing LE weakness-persistant, being worked up by Hydrographic surveyor and neurologist  . H/O hyperkalemia     Stable    Past Surgical History  Procedure Laterality Date  . Mohs surgery      1997 (lip), 2004  . Hemorrhoid surgery  1990  . Nephrectomy  2009    Open nephrectomy for cancer  . Abdominal aortic aneurysm repair  2009    EVAR    There were no vitals filed for this visit.  Visit Diagnosis:  Spinal stenosis of lumbar region  Difficulty walking      Subjective Assessment - 07/09/15 1401    Subjective Reports no pain in his back but his hip gives him a fit sometimes. Denies pain. States he felt pretty good after last time.    Currently in Pain? No/denies     TODAY'S TREATMENT: There Ex: Rec Bike level 2 x 6' Bridging 2x10 SAQ 3# 3"x10x2 Hooklying Hip Abduction Green  TB SL x15 each Hooklying March x15 Seated One Arm Row Green TB 2x10 Standing Hip Flexion x10 each leg with HHA on 2 poles Standing Hip Abduction x10 each leg with HHA on 2 poles Heel Raises on Blue Foam x15 with HHA on 2 poles  Neuro: Standing March on Blue Foam x10 with HHA on 2 poles Standing Alternating Hip Abduction on Blue Foam x10 with HHA on 2 poles Sit to Stand without UE assist x10 with focus on eccentric control Standing on Blue Foam Toe Tapping to 6" Step x15 with HHA on 2 poles Squats on Blue Foam x10 with HHA on 2 poles       PT Long Term Goals - 07/09/15 1403    PT LONG TERM GOAL #1   Title Pt will improve 77min walk time to being able to walk the complete 6 minutes with at least 900 feet completed   Status On-going   PT LONG TERM GOAL #2   Title pt will demonstrate safe RW use in clinic to improve community ambulation endurance   Status On-going   PT LONG TERM GOAL #3   Title pt will improve R hip strength to 4/5 or greater  Status On-going   PT LONG TERM GOAL #4   Title pt will return to walking at least 1.5 miles at the gym   Status On-going               Plan - 07/09/15 1439    Clinical Impression Statement Excellent tolerance to all exercises with pt responding well to standing balance with no LOB. Still needs rest breaks due to fatigue but is very motivated to continue.    PT Next Visit Plan lumbar stenosis POC, LE strengthening, bike/NuStep, balance as needed   Consulted and Agree with Plan of Care Patient        Problem List Patient Active Problem List   Diagnosis Date Noted  . Neuropathy 06/10/2015  . Pedal edema 01/30/2015  . Hip pain, chronic 01/30/2015  . Back pain 01/30/2015  . Cramps of lower extremity 01/30/2015  . Aftercare following surgery of the circulatory system, [place] 08/05/2014  . COPD exacerbation 01/10/2014  . Annual physical exam 10/19/2011  . DJD (degenerative joint disease) 12/14/2010  . Anemia 06/06/2009  . COPD  (chronic obstructive pulmonary disease) 06/06/2009  . RENAL CELL CANCER 12/25/2008  . HTN (hypertension) 12/25/2008  . AAA (abdominal aortic aneurysm) without rupture 12/25/2008  . CKD (chronic kidney disease) 12/25/2008  . HYPERCHOLESTEROLEMIA 03/01/2008  . GERD 03/01/2008  . ACNE ROSACEA 11/29/2007  . DIVERTICULOSIS, COLON 04/12/2007    Barbette Hair, PTA 07/09/2015, 2:40 PM  [facility] [address]  [place], [place], [address] Phone: [phone]   Fax:  [phone]

## 2015-07-09 NOTE — Telephone Encounter (Signed)
See report below. Advised the patient or his daughter: Study confirm neuropathy, recommend a trial with gabapentin as recommended.    1. The electrophysiologic findings are most consistent with a generalized sensorimotor polyneuropathy, axon loss in type, affecting the lower extremities. Overall, these findings are moderate to severe in degree electrically. 2. There is no evidence of a superimposed lumbosacral radiculopathy affecting the lower extremities.

## 2015-07-10 NOTE — Telephone Encounter (Signed)
thx

## 2015-07-10 NOTE — Telephone Encounter (Signed)
Pt has an appt on 07/14/2015 to go over results.

## 2015-07-14 ENCOUNTER — Telehealth: Payer: Self-pay | Admitting: Neurology

## 2015-07-14 ENCOUNTER — Encounter: Payer: Self-pay | Admitting: Internal Medicine

## 2015-07-14 ENCOUNTER — Ambulatory Visit: Payer: Medicare Other | Admitting: Physical Therapy

## 2015-07-14 ENCOUNTER — Ambulatory Visit (INDEPENDENT_AMBULATORY_CARE_PROVIDER_SITE_OTHER): Payer: Medicare Other | Admitting: Internal Medicine

## 2015-07-14 ENCOUNTER — Ambulatory Visit: Payer: Medicare Other | Admitting: Rehabilitation

## 2015-07-14 VITALS — BP 132/66 | HR 62 | Temp 97.7°F | Ht 68.0 in | Wt 176.4 lb

## 2015-07-14 DIAGNOSIS — R262 Difficulty in walking, not elsewhere classified: Secondary | ICD-10-CM | POA: Diagnosis not present

## 2015-07-14 DIAGNOSIS — N5089 Other specified disorders of the male genital organs: Secondary | ICD-10-CM

## 2015-07-14 DIAGNOSIS — N508 Other specified disorders of male genital organs: Secondary | ICD-10-CM

## 2015-07-14 DIAGNOSIS — G629 Polyneuropathy, unspecified: Secondary | ICD-10-CM

## 2015-07-14 DIAGNOSIS — M4806 Spinal stenosis, lumbar region: Secondary | ICD-10-CM | POA: Diagnosis not present

## 2015-07-14 DIAGNOSIS — M48061 Spinal stenosis, lumbar region without neurogenic claudication: Secondary | ICD-10-CM

## 2015-07-14 DIAGNOSIS — N189 Chronic kidney disease, unspecified: Secondary | ICD-10-CM

## 2015-07-14 MED ORDER — CHLORTHALIDONE 25 MG PO TABS: 25.0000 mg | ORAL_TABLET | Freq: Every day | ORAL | Status: DC

## 2015-07-14 MED ORDER — LABETALOL HCL 100 MG PO TABS: 100.0000 mg | ORAL_TABLET | Freq: Two times a day (BID) | ORAL | Status: DC

## 2015-07-14 MED ORDER — HYDRALAZINE HCL 25 MG PO TABS: 25.0000 mg | ORAL_TABLET | Freq: Two times a day (BID) | ORAL | Status: DC

## 2015-07-14 MED ORDER — GABAPENTIN 300 MG PO CAPS: 300.0000 mg | ORAL_CAPSULE | Freq: Three times a day (TID) | ORAL | Status: DC

## 2015-07-14 NOTE — Telephone Encounter (Signed)
[name] [name] called and wanted to make sure that the results of his nerve conduction study was sent to his PCP for his doctors appointment today, please call back ASAP to let her know before she tries to get him out of the house/Dawn CB# [phone]

## 2015-07-14 NOTE — Progress Notes (Signed)
Pre visit review using our clinic review tool, if applicable. No additional management support is needed unless otherwise documented below in the visit note. 

## 2015-07-14 NOTE — Patient Instructions (Addendum)
Take gabapentin 300 mg: 1 tablet at bedtime for 10 days 1 tablet twice a day for 10 days Then one tablet 3 times a day

## 2015-07-14 NOTE — Assessment & Plan Note (Signed)
Since the last office visit, blood work was unrevealing and the nerve conduction study confirmed neuropathy, see report below. No clear etiology although he has mild renal failure. Low dose of gabapentin not helping. The patient would like to find out the etiology for his symptoms, plan: Increase gradually gabapentin dose see instructions Neurology referral, further eval to assert etiology of neuropathy?   Nerve conduction study 06-2015 is as follows: 1. The electrophysiologic findings are most consistent with a generalized sensorimotor polyneuropathy, axon loss in type, affecting the lower extremities. Overall, these findings are moderate to severe in degree electrically. 2. There is no evidence of a superimposed lumbosacral radiculopathy affecting the lower extremities. 3.

## 2015-07-14 NOTE — Therapy (Signed)
[facility] [address]  [place], [place], [address] Phone: [phone]   Fax:  [phone]  Physical Therapy Treatment  Patient Details  Name: [name] MRN: [id number] Date of Birth: 1927/06/17 Referring Provider:  Jessy Oto, MD  Encounter Date: 07/14/2015      PT End of Session - 07/14/15 1315    Visit Number 4   Number of Visits 12   Date for PT Re-Evaluation 08/14/15   PT Start Time 6010   PT Stop Time 9323   PT Time Calculation (min) 40 min      Past Medical History  Diagnosis Date  . Abdominal aortic aneurysm 09/2008    s/o endovascular repair and angiogram 10/04/08   . Bleeding ulcer 1997  . Leukopenia 2/11    thought to be from doxy  . Renal cell carcinoma     clear cell type dx 10/09, s/p excision 10-24-08  . Hypertension   . Renal insufficiency   . COPD (chronic obstructive pulmonary disease)   . Anemia     EGD Cscope 08-2012  . CKD (chronic kidney disease), stage IV     Stable, Dr. Dimas Aguas  . Solitary left kidney     W/ chronic tubule-interstitial damage, Dr. Dimas Aguas  . Spinal stenosis     causing LE weakness-persistant, being worked up by Hydrographic surveyor and neurologist  . H/O hyperkalemia     Stable    Past Surgical History  Procedure Laterality Date  . Mohs surgery      1997 (lip), 2004  . Hemorrhoid surgery  1990  . Nephrectomy  2009    Open nephrectomy for cancer  . Abdominal aortic aneurysm repair  2009    EVAR    There were no vitals filed for this visit.  Visit Diagnosis:  Spinal stenosis of lumbar region  Difficulty walking      Subjective Assessment - 07/14/15 1317    Subjective States he doesn't have pain, just feels tired. Says walking to his mailbox and back wears him out now.    Currently in Pain? No/denies        TODAY'S TREATMENT: There Ex: Rec Bike level 0-1 x 6' Side-Stepping at counter with Red TB at ankles 10 ft x3 each  way Standing Hip Flexion with Red TB at ankles x10 each leg LAQ 2# 3"x10 Seated March 2# x15 Seated One Arm Row Green TB x10 each then Bilateral Low Row Green TB x10 Bridging 2x10 SLR 2x10 each leg SAQ 3# 3"x15  Neuro: Standing March on Blue Foam x10 with 0-1 HHA on counter Heel Raises on Blue Foam x20 with 1 HHA on counter Squats on Blue Foam x10 with 2 HHA on counter  Lateral Step up/over Blue Foam x10 with 2 HHA on counter         PT Long Term Goals - 07/09/15 1403    PT LONG TERM GOAL #1   Title Pt will improve 66min walk time to being able to walk the complete 6 minutes with at least 900 feet completed   Status On-going   PT LONG TERM GOAL #2   Title pt will demonstrate safe RW use in clinic to improve community ambulation endurance   Status On-going   PT LONG TERM GOAL #3   Title pt will improve R hip strength to 4/5 or greater   Status On-going   PT LONG TERM GOAL #4   Title pt will return  to walking at least 1.5 miles at the gym   Status On-going               Plan - 07/14/15 1357    Clinical Impression Statement Pt requried a few more rest breaks compared to the other day. Some were standing rest breaks today though. Will start to work on walking tolerance in next few appointments along with back and hip strengthening.    PT Next Visit Plan lumbar stenosis POC, LE strengthening, bike/NuStep, balance as needed, walking tolerance   Consulted and Agree with Plan of Care Patient        Problem List Patient Active Problem List   Diagnosis Date Noted  . Neuropathy 06/10/2015  . Pedal edema 01/30/2015  . Hip pain, chronic 01/30/2015  . Back pain 01/30/2015  . Cramps of lower extremity 01/30/2015  . Aftercare following surgery of the circulatory system, [place] 08/05/2014  . COPD exacerbation 01/10/2014  . Annual physical exam 10/19/2011  . DJD (degenerative joint disease) 12/14/2010  . Anemia 06/06/2009  . COPD (chronic obstructive pulmonary disease)  06/06/2009  . RENAL CELL CANCER 12/25/2008  . HTN (hypertension) 12/25/2008  . AAA (abdominal aortic aneurysm) without rupture 12/25/2008  . CKD (chronic kidney disease) 12/25/2008  . HYPERCHOLESTEROLEMIA 03/01/2008  . GERD 03/01/2008  . ACNE ROSACEA 11/29/2007  . DIVERTICULOSIS, COLON 04/12/2007    Barbette Hair, PTA  07/14/2015, 2:00 PM  [facility] [place], [place], [address] Phone: [phone]   Fax:  [phone]

## 2015-07-14 NOTE — Telephone Encounter (Signed)
[name] informed that [name] has the results.

## 2015-07-14 NOTE — Progress Notes (Signed)
Subjective:    Patient ID: [name], male    DOB: [birth year], 81 y.o.   MRN: [id number]  DOS:  07/14/2015 Type of visit - description : Follow-up, here with his daughter Interval history: Likes to discuss the results of the nerve conduction study. At the last visit he was switch to gabapentin, symptoms are not improving they are actually slightly more noticeable.   Review of Systems   Past Medical History  Diagnosis Date  . Abdominal aortic aneurysm 09/2008    s/o endovascular repair and angiogram 10/04/08   . Bleeding ulcer 1997  . Leukopenia 2/11    thought to be from doxy  . Renal cell carcinoma     clear cell type dx 10/09, s/p excision 10-24-08  . Hypertension   . Renal insufficiency   . COPD (chronic obstructive pulmonary disease)   . Anemia     EGD Cscope 08-2012  . CKD (chronic kidney disease), stage IV     Stable, Dr. Dimas Aguas  . Solitary left kidney     W/ chronic tubule-interstitial damage, Dr. Dimas Aguas  . Spinal stenosis     causing LE weakness-persistant, being worked up by Hydrographic surveyor and neurologist  . H/O hyperkalemia     Stable    Past Surgical History  Procedure Laterality Date  . Mohs surgery      1997 (lip), 2004  . Hemorrhoid surgery  1990  . Nephrectomy  2009    Open nephrectomy for cancer  . Abdominal aortic aneurysm repair  2009    EVAR    History   Social History  . Marital Status: Married    Spouse Name: N/A  . Number of Children: 3  . Years of Education: N/A   Occupational History  . retired    Social History Main Topics  . Smoking status: Former Smoker    Quit date: 12/28/2007  . Smokeless tobacco: Never Used     Comment: used to smoke 1.5 ppd  . Alcohol Use: No  . Drug Use: No  . Sexual Activity: Not on file   Other Topics Concern  . Not on file   Social History Narrative   Lives w/ wife who has dementia    Totally independent on ADL    Often came to office w/ his daughter July [phone]), his other  daughter is  [name] , son is Laurel                           Medication List       This list is accurate as of: 07/14/15 11:59 PM.  Always use your most recent med list.               Casanthranol-Docusate Sodium 30-100 MG Caps  Take by mouth.     chlorthalidone 25 MG tablet  Commonly known as:  HYGROTON  Take 1 tablet (25 mg total) by mouth daily.     ergocalciferol 50000 UNITS capsule  Commonly known as:  VITAMIN D2  Take 1 capsule (50,000 Units total) by mouth once a week.     gabapentin 300 MG capsule  Commonly known as:  NEURONTIN  Take 1 capsule (300 mg total) by mouth 3 (three) times daily.     hydrALAZINE 25 MG tablet  Commonly known as:  APRESOLINE  Take 1 tablet (25 mg total) by mouth 2 (two) times daily.     labetalol 100 MG tablet  Commonly  known as:  NORMODYNE  Take 1 tablet (100 mg total) by mouth 2 (two) times daily.     mometasone 50 MCG/ACT nasal spray  Commonly known as:  NASONEX  2 sprays each nares q day     MULTIVITAMIN/IRON PO  Take by mouth.     SE-TAN PLUS 162-115.2-1 MG Caps  Take 1 tablet by mouth daily. Take 1 Tab  daily           Objective:   Physical Exam BP 132/66 mmHg  Pulse 62  Temp(Src) 97.7 F (36.5 C) (Oral)  Ht 5\' 8"  (1.727 m)  Wt 176 lb 7 oz (80.032 kg)  BMI 26.83 kg/m2  SpO2 96%    General:   Well developed, well nourished . NAD.  HEENT:  Normocephalic . Face symmetric, atraumatic Skin: Not pale. Not jaundice Neurologic:  alert & oriented X3.  Speech normal, gait appropriate for age and unassisted Psych--  Cognition and judgment appear intact.  Cooperative with normal attention span and concentration.  Behavior appropriate. No anxious or depressed appearing.   Assessment & Plan:    Osteoporosis screening The patient's neurosurgeon  is concerned about osteoporosis and recommended a bone density test. See orders

## 2015-07-15 ENCOUNTER — Telehealth: Payer: Self-pay | Admitting: Internal Medicine

## 2015-07-15 DIAGNOSIS — G629 Polyneuropathy, unspecified: Secondary | ICD-10-CM

## 2015-07-15 NOTE — Telephone Encounter (Signed)
Daughter aware.

## 2015-07-15 NOTE — Telephone Encounter (Signed)
Caller name:Julie Copeland Relationship to patient:daughter  Can be reached:[phone] Pharmacy:  Reason for call:Requesting update on Neurology referral, do not see referral in computer. Was told at office visit yesterday that referral would be made. Please advise

## 2015-07-15 NOTE — Telephone Encounter (Signed)
[name] has not closed OV note from yesterday, that is when he normally places referrals/orders. However, I have placed referral to neurology. Not an urgent referral per [name].

## 2015-07-16 ENCOUNTER — Ambulatory Visit: Payer: Medicare Other | Admitting: Rehabilitation

## 2015-07-16 ENCOUNTER — Other Ambulatory Visit (HOSPITAL_BASED_OUTPATIENT_CLINIC_OR_DEPARTMENT_OTHER): Payer: Medicare Other

## 2015-07-16 DIAGNOSIS — M4806 Spinal stenosis, lumbar region: Secondary | ICD-10-CM | POA: Diagnosis not present

## 2015-07-16 DIAGNOSIS — R262 Difficulty in walking, not elsewhere classified: Secondary | ICD-10-CM | POA: Diagnosis not present

## 2015-07-16 DIAGNOSIS — M48061 Spinal stenosis, lumbar region without neurogenic claudication: Secondary | ICD-10-CM

## 2015-07-16 NOTE — Therapy (Signed)
[facility] [address]  [place], [place], [address] Phone: [phone]   Fax:  [phone]  Physical Therapy Treatment  Patient Details  Name: [name] MRN: [id number] Date of Birth: 18-Jan-1927 Referring Provider:  Jessy Oto, MD  Encounter Date: 07/16/2015      PT End of Session - 07/16/15 1402    Visit Number 5   Number of Visits 12   Date for PT Re-Evaluation 08/14/15   PT Start Time 1400   PT Stop Time 1439   PT Time Calculation (min) 39 min      Past Medical History  Diagnosis Date  . Abdominal aortic aneurysm 09/2008    s/o endovascular repair and angiogram 10/04/08   . Bleeding ulcer 1997  . Leukopenia 2/11    thought to be from doxy  . Renal cell carcinoma     clear cell type dx 10/09, s/p excision 10-24-08  . Hypertension   . Renal insufficiency   . COPD (chronic obstructive pulmonary disease)   . Anemia     EGD Cscope 08-2012  . CKD (chronic kidney disease), stage IV     Stable, Dr. Dimas Aguas  . Solitary left kidney     W/ chronic tubule-interstitial damage, Dr. Dimas Aguas  . Spinal stenosis     causing LE weakness-persistant, being worked up by Hydrographic surveyor and neurologist  . H/O hyperkalemia     Stable    Past Surgical History  Procedure Laterality Date  . Mohs surgery      1997 (lip), 2004  . Hemorrhoid surgery  1990  . Nephrectomy  2009    Open nephrectomy for cancer  . Abdominal aortic aneurysm repair  2009    EVAR    There were no vitals filed for this visit.  Visit Diagnosis:  Spinal stenosis of lumbar region  Difficulty walking      Subjective Assessment - 07/16/15 1402    Subjective Reports no pain, just tired   Currently in Pain? No/denies      TODAY'S TREATMENT: There Ex: Rec Bike level 0-1 x 6' BOSU (up) Lateral Step ups x10 each way Side-Stepping at counter with Green TB at ankles 10 ft x2 each way Seated One Arm Row Green TB x15 each then  Bilateral Low Row Green TB x15 Seated OH lifts focusing on Trunk Extension/upright posture with red med ball (1000gr) x10 Seated Diagonal focusing on upright posture lifts with red med ball (1000gr) x10 Hooklying Single Hip Abduction Green TB x15 each  Bridging x15 SAQ 3# 3"x15x2 Slow sit to stand with eccentric control and no HHA x10        PT Long Term Goals - 07/09/15 1403    PT LONG TERM GOAL #1   Title Pt will improve 97min walk time to being able to walk the complete 6 minutes with at least 900 feet completed   Status On-going   PT LONG TERM GOAL #2   Title pt will demonstrate safe RW use in clinic to improve community ambulation endurance   Status On-going   PT LONG TERM GOAL #3   Title pt will improve R hip strength to 4/5 or greater   Status On-going   PT LONG TERM GOAL #4   Title pt will return to walking at least 1.5 miles at the gym   Status On-going               Plan - 07/16/15 1439  Clinical Impression Statement Noted more fatigue today with standing exercises so attempted more seated/hooklying trunk exercises. Increased HEP today to help with posture.    PT Next Visit Plan lumbar stenosis POC, LE strengthening, bike/NuStep, balance as needed, walking tolerance   Consulted and Agree with Plan of Care Patient        Problem List Patient Active Problem List   Diagnosis Date Noted  . Neuropathy 06/10/2015  . Pedal edema 01/30/2015  . Hip pain, chronic 01/30/2015  . Back pain 01/30/2015  . Cramps of lower extremity 01/30/2015  . Aftercare following surgery of the circulatory system, Sweeny 08/05/2014  . COPD exacerbation 01/10/2014  . Annual physical exam 10/19/2011  . DJD (degenerative joint disease) 12/14/2010  . Anemia 06/06/2009  . COPD (chronic obstructive pulmonary disease) 06/06/2009  . RENAL CELL CANCER 12/25/2008  . HTN (hypertension) 12/25/2008  . AAA (abdominal aortic aneurysm) without rupture 12/25/2008  . CKD (chronic kidney disease)  12/25/2008  . HYPERCHOLESTEROLEMIA 03/01/2008  . GERD 03/01/2008  . ACNE ROSACEA 11/29/2007  . DIVERTICULOSIS, COLON 04/12/2007    Barbette Hair, PTA 07/16/2015, 2:40 PM  [facility] [address]  [place], [place], [address] Phone: [phone]   Fax:  [phone]

## 2015-07-21 ENCOUNTER — Ambulatory Visit: Payer: Medicare Other | Admitting: Physical Therapy

## 2015-07-21 ENCOUNTER — Encounter: Payer: Self-pay | Admitting: Neurology

## 2015-07-21 ENCOUNTER — Other Ambulatory Visit: Payer: Medicare Other

## 2015-07-21 ENCOUNTER — Ambulatory Visit (INDEPENDENT_AMBULATORY_CARE_PROVIDER_SITE_OTHER): Payer: Medicare Other | Admitting: Neurology

## 2015-07-21 VITALS — BP 110/64 | HR 62 | Ht 68.0 in | Wt 176.3 lb

## 2015-07-21 DIAGNOSIS — G609 Hereditary and idiopathic neuropathy, unspecified: Secondary | ICD-10-CM

## 2015-07-21 DIAGNOSIS — R202 Paresthesia of skin: Secondary | ICD-10-CM

## 2015-07-21 DIAGNOSIS — M48061 Spinal stenosis, lumbar region without neurogenic claudication: Secondary | ICD-10-CM

## 2015-07-21 DIAGNOSIS — R269 Unspecified abnormalities of gait and mobility: Secondary | ICD-10-CM

## 2015-07-21 DIAGNOSIS — M4806 Spinal stenosis, lumbar region: Secondary | ICD-10-CM | POA: Diagnosis not present

## 2015-07-21 DIAGNOSIS — R262 Difficulty in walking, not elsewhere classified: Secondary | ICD-10-CM | POA: Diagnosis not present

## 2015-07-21 DIAGNOSIS — R209 Unspecified disturbances of skin sensation: Secondary | ICD-10-CM | POA: Diagnosis not present

## 2015-07-21 LAB — CK: CK TOTAL: 146 U/L (ref 7–232)

## 2015-07-21 LAB — TSH: TSH: 1.65 u[IU]/mL (ref 0.35–4.50)

## 2015-07-21 NOTE — Patient Instructions (Signed)
1.  Check blood work 2.  Continue gabapentin as follows:    AM  PM  Week 1 100mg   300mg   Week 2 200mg   300mg   Week 3 300mg   300mg   Stay at the lowest dose that controls your pain the best  We can further increase the gabapentin as needed, but will need to keep in mind his renal disease 3.  Continue physical therapy 4.  Patient requesting second opinion at [facility] 5.  Return to clinic in 3-4 months.

## 2015-07-21 NOTE — Therapy (Signed)
[facility] [address]  [place], [place], [address] Phone: [phone]   Fax:  [phone]  Physical Therapy Treatment  Patient Details  Name: [name] MRN: [id number] Date of Birth: Dec 17, 1927 Referring Provider:  Jessy Oto, MD  Encounter Date: 07/21/2015      PT End of Session - 07/21/15 1359    Visit Number 6   Number of Visits 12   Date for PT Re-Evaluation 08/14/15   PT Start Time 3419   PT Stop Time 1442   PT Time Calculation (min) 46 min   Activity Tolerance Patient tolerated treatment well   Behavior During Therapy [facility] for tasks assessed/performed      Past Medical History  Diagnosis Date  . Abdominal aortic aneurysm 09/2008    s/o endovascular repair and angiogram 10/04/08   . Bleeding ulcer 1997  . Leukopenia 2/11    thought to be from doxy  . Renal cell carcinoma     clear cell type dx 10/09, s/p excision 10-24-08  . Hypertension   . Renal insufficiency   . COPD (chronic obstructive pulmonary disease)   . Anemia     EGD Cscope 08-2012  . CKD (chronic kidney disease), stage IV     Stable, Dr. Dimas Aguas  . Solitary left kidney     W/ chronic tubule-interstitial damage, Dr. Dimas Aguas  . Spinal stenosis     causing LE weakness-persistant, being worked up by Hydrographic surveyor and neurologist  . H/O hyperkalemia     Stable    Past Surgical History  Procedure Laterality Date  . Mohs surgery      1997 (lip), 2004  . Hemorrhoid surgery  1990  . Nephrectomy  2009    Open nephrectomy for cancer  . Abdominal aortic aneurysm repair  2009    EVAR    There were no vitals filed for this visit.  Visit Diagnosis:  Spinal stenosis of lumbar region  Difficulty walking      Subjective Assessment - 07/21/15 1359    Subjective "i'm doing ok, just moving a little slow."   Currently in Pain? No/denies          TODAY'S TREATMENT:   Ther Ex: Rec Bike level 0-1 x 3' Treadmill  1.0-1.5 mph x 6' BOSU (up) Lateral Step ups x10 each way Side-Stepping at counter with Green TB at ankles 10 ft x2 each way Retro gait at counter with Green TB at ankle 21ft x 2 Seated One Arm Row Green TB x15 each then Bilateral Low Row Green TB x15 Seated OH lifts focusing on Trunk Extension/upright posture with red med ball (1000gr) x10 Seated Diagonal lifts focusing on upright posture with red med ball (1000gr) x10 Slow sit to stand with eccentric control and no HHA x10   Neuro: Standing March on Blue Foam x20 with 2 poles for support SLS on Blue foam with toe touch forward/side/back with 2 pole support, x5 bilateral    Gait  900' around track - 4'40"            PT Long Term Goals - 07/21/15 1444    PT LONG TERM GOAL #1   Title Pt will improve 36min walk time to being able to walk the complete 6 minutes with at least 900 feet completed (08/14/15)   Status On-going   PT LONG TERM GOAL #2   Title pt will demonstrate safe RW use in clinic to improve community ambulation endurance  Status Deferred  Patient not interested in using RW - has RW and SPC available at home but unwilling to use   PT LONG TERM GOAL #3   Title pt will improve R hip strength to 4/5 or greater (08/14/15)   Status On-going   PT LONG TERM GOAL #4   Title pt will return to walking at least 1.5 miles at the gym   Status On-going               Plan - 07/21/15 1446    Clinical Impression Statement Patient with increasing tolerance for standing exercises and balance activties along with gait activities with fewer rest breaks required, although continued cues necessary for pacing for safety.   PT Next Visit Plan lumbar stenosis POC, LE strengthening, bike/NuStep/Treadmill, balance as needed, walking tolerance   Consulted and Agree with Plan of Care Patient        Problem List Patient Active Problem List   Diagnosis Date Noted  . Neuropathy 06/10/2015  . Pedal edema 01/30/2015  . Hip pain,  chronic 01/30/2015  . Back pain 01/30/2015  . Cramps of lower extremity 01/30/2015  . Aftercare following surgery of the circulatory system, [name] 08/05/2014  . COPD exacerbation 01/10/2014  . Annual physical exam 10/19/2011  . DJD (degenerative joint disease) 12/14/2010  . Anemia 06/06/2009  . COPD (chronic obstructive pulmonary disease) 06/06/2009  . RENAL CELL CANCER 12/25/2008  . HTN (hypertension) 12/25/2008  . AAA (abdominal aortic aneurysm) without rupture 12/25/2008  . CKD (chronic kidney disease) 12/25/2008  . HYPERCHOLESTEROLEMIA 03/01/2008  . GERD 03/01/2008  . ACNE ROSACEA 11/29/2007  . DIVERTICULOSIS, COLON 04/12/2007    Percival Spanish, PT, MPT 07/21/2015, 4:09 PM  [facility] [address]  [place], [place], [address] Phone: [phone]   Fax:  [phone]

## 2015-07-21 NOTE — Progress Notes (Signed)
[name] Clinic Note - Initial Visit   Date: 07/21/2015  [name] MRN: [id number] DOB: 1927-12-04   Dear [name]:  Thank you for your kind referral of [name] for consultation of gait abnormality.  Although her history is well known to you, please allow [place] to reiterate it for the purpose of our medical record. The patient was accompanied to the clinic by daughter [name]) who also provides collateral information.     History of Present Illness: [name] is a 81 y.o. right-handed Caucasian male with AAA s/p repair (2009), renal cell carcinoma s/p excision (2009), COPD, hypertension, Stage IV CKD presenting for evaluation of bilateral feet heaviness.  Since 2015, he started experiencing heaviness and burning sensation of the feet.  He describes the soles of his feet as "hot and bubbly".  He was started gabapentin and given a titration schedule to 376m TID.  He is currently taking 304mqhs and has started to notice some improvement.  His main issue is legs feeling heavy which limits his ability to walk.  He was previously going to the gym and able to walk 45 minutes, but since February he has been unable to walk long distances.  He continues to walk independently and has not had any falls.  He feels that phsyical therapy is helping.    He underwent imaging of the lumbar spine which was limited due to artifact, but did not show significant nerve impingement or canal stenosis.  EMG of the legs was consistent with generalized sensorimotor polyneuropathy.    There is no diurnal variation to his sensation of weakness.  Denies dysphagia, dysarthria, or double vision.   Out-side paper records, electronic medical record, and images have been reviewed where available and summarized as:  EMG of the legs 07/08/2015: 1. The electrophysiologic findings are most consistent with a generalized sensorimotor polyneuropathy, axon loss in type, affecting the lower  extremities. Overall, these findings are moderate to severe in degree electrically. 2. There is no evidence of a superimposed lumbosacral radiculopathy affecting the lower extremities.  MRI lumbar spine 05/13/2015: Unfortunately, there is artifact obscuring portions of the lumbar spine secondary to aortic stent graft. Mild spinal stenosis at L3-4 and L4-5 Grade 1 slip L5-S1 without significant spinal stenosis.  Labs 06/09/2015:  ESR 11, SPEP with IFE no M protein, HbA1c 5.1, vitamin D 30, vitamin B12 500, folate >24    Past Medical History  Diagnosis Date  . Abdominal aortic aneurysm 09/2008    s/o endovascular repair and angiogram 10/04/08   . Bleeding ulcer 1997  . Leukopenia 2/11    thought to be from doxy  . Renal cell carcinoma     clear cell type dx 10/09, s/p excision 10-24-08  . Hypertension   . Renal insufficiency   . COPD (chronic obstructive pulmonary disease)   . Anemia     EGD Cscope 08-2012  . CKD (chronic kidney disease), stage IV     Stable, Dr. IgDimas Aguas. Solitary left kidney     W/ chronic tubule-interstitial damage, Dr. IgDimas Aguas. Spinal stenosis     causing LE weakness-persistant, being worked up by vaHydrographic surveyornd neurologist  . H/O hyperkalemia     Stable    Past Surgical History  Procedure Laterality Date  . Mohs surgery      1997 (lip), 2004  . Hemorrhoid surgery  1990  . Nephrectomy  2009    Open nephrectomy for cancer  .  Abdominal aortic aneurysm repair  2009    EVAR     Medications:  Outpatient Encounter Prescriptions as of 07/21/2015  Medication Sig  . Casanthranol-Docusate Sodium 30-100 MG CAPS Take by mouth.    . chlorthalidone (HYGROTON) 25 MG tablet Take 1 tablet (25 mg total) by mouth daily.  . ergocalciferol (VITAMIN D2) 50000 UNITS capsule Take 1 capsule (50,000 Units total) by mouth once a week.  . FeFum-FePo-FA-B Cmp-C-Zn-Mn-Cu (SE-TAN PLUS) 162-115.2-1 MG CAPS Take 1 tablet by mouth daily. Take 1 Tab  daily  .  gabapentin (NEURONTIN) 300 MG capsule Take 1 capsule (300 mg total) by mouth 3 (three) times daily.  . hydrALAZINE (APRESOLINE) 25 MG tablet Take 1 tablet (25 mg total) by mouth 2 (two) times daily.  Marland Kitchen labetalol (NORMODYNE) 100 MG tablet Take 1 tablet (100 mg total) by mouth 2 (two) times daily.  . mometasone (NASONEX) 50 MCG/ACT nasal spray 2 sprays each nares q day (Patient not taking: Reported on 07/14/2015)  . Multiple Vitamins-Iron (MULTIVITAMIN/IRON PO) Take by mouth.     No facility-administered encounter medications on file as of 07/21/2015.     Allergies:  Allergies  Allergen Reactions  . Baclofen Other (See Comments)    Unable to walk per Pt's family     Family History: Family History  Problem Relation Age of Onset  . Diabetes Father     ?  . Stroke Father   . Hypertension Father   . Colon cancer Neg Hx   . Prostate cancer Neg Hx     Social History: History  Substance Use Topics  . Smoking status: Former Smoker    Quit date: 12/28/2007  . Smokeless tobacco: Never Used     Comment: used to smoke 1.5 ppd  . Alcohol Use: No   History   Social History Narrative   Lives w/ wife who has dementia    Totally independent on ADL    Often came to office w/ his daughter July [phone]), his other daughter is  [name] , son is [name]                       Review of Systems:  CONSTITUTIONAL: No fevers, chills, night sweats, or weight loss.   EYES: No visual changes or eye pain ENT: No hearing changes.  No history of nose bleeds.   RESPIRATORY: No cough, wheezing and shortness of breath.   CARDIOVASCULAR: Negative for chest pain, and palpitations.   GI: Negative for abdominal discomfort, blood in stools or black stools.  No recent change in bowel habits.   GU:  No history of incontinence.   MUSCLOSKELETAL: No history of joint pain or swelling.  No myalgias.   SKIN: Negative for lesions, rash, and itching.   HEMATOLOGY/ONCOLOGY: Negative for prolonged bleeding,  bruising easily, and swollen nodes.  +history of cancer.   ENDOCRINE: Negative for cold or heat intolerance, polydipsia or goiter.   PSYCH:  No depression or anxiety symptoms.   NEURO: As Above.   Vital Signs:  BP 110/64 mmHg  Pulse 62  Ht _0  (1.727 m)  Wt 176 lb 5 oz (79.975 kg)  BMI 26.81 kg/m2  SpO2 93%   General Medical Exam:   General:  Well appearing, comfortable.   Eyes/ENT: see cranial nerve examination.   Neck: No masses appreciated.  Full range of motion without tenderness.  No carotid bruits. Respiratory:  Clear to auscultation, good air entry bilaterally.   Cardiac:  Regular  rate and rhythm, no murmur.   Extremities:  No deformities, edema, or skin discoloration.  Skin:  No rashes or lesions.  Neurological Exam: MENTAL STATUS including orientation to time, place, person, recent and remote memory, attention span and concentration, language, and fund of knowledge is normal.  Speech is not dysarthric.  CRANIAL NERVES: II:  No visual field defects.  Unremarkable fundi.   III-IV-VI: Pupils equal round and reactive to light.  Normal conjugate, extra-ocular eye movements in all directions of gaze.  No nystagmus.  No ptosis.   V:  Normal facial sensation.   VII:  Normal facial symmetry and movements.   VIII:  Normal hearing and vestibular function.   IX-X:  Normal palatal movement.   XI:  Normal shoulder shrug and head rotation.   XII:  Normal tongue strength and range of motion, no deviation or fasciculation.  MOTOR:  No atrophy, fasciculations or abnormal movements.  No pronator drift.  Mild cogwheel in RUE.  Right Upper Extremity:    Left Upper Extremity:    Deltoid  5/5   Deltoid  5/5   Biceps  5/5   Biceps  5/5   Triceps  5/5   Triceps  5/5   Wrist extensors  5/5   Wrist extensors  5/5   Wrist flexors  5/5   Wrist flexors  5/5   Finger extensors  5/5   Finger extensors  5/5   Finger flexors  5/5   Finger flexors  5/5   Dorsal interossei  5-/5   Dorsal  interossei  5-/5   Abductor pollicis  5/5   Abductor pollicis  5/5    Right Lower Extremity:    Left Lower Extremity:    Hip flexors  5/5   Hip flexors  5/5   Hip extensors  5/5   Hip extensors  5/5   Knee flexors  5/5   Knee flexors  5/5   Knee extensors  5/5   Knee extensors  5/5   Dorsiflexors  5-/5   Dorsiflexors  5-/5   Plantarflexors  5-/5   Plantarflexors  5-/5   Toe extensors  4+/5   Toe extensors  4+/5   Toe flexors  5-/5   Toe flexors  5-/5    MSRs:  Right                                                                 Left brachioradialis 2+  brachioradialis 2+  biceps 2+  biceps 2+  triceps 2+  triceps 2+  patellar 2+  patellar 2+  ankle jerk 0  ankle jerk 0  Hoffman no  Hoffman no  plantar response down  plantar response down   SENSORY:  Vibration reduced at knees and absent distal to ankles bilaterally.  Pin prick, temperature, and light touch also reduced distal to mid-calf.  Proprioception at the great toe impaired.  Romberg's sign positive.   COORDINATION/GAIT: Normal finger-to- nose-finger.  Intact rapid alternating movements bilaterally.  Unable to rise from a chair without using arms.  Gait shows short steps at first, but once he is started stride length is more appropriate, slightly wide-based, but appears stable.  He is unable to perform stressed or tandem gait.   IMPRESSION: [name] is a  81 year-old gentleman presenting for evaluation of bilateral feet paresthesias and leg weakness. His neurological examination shows a distal predominant large and small fiber peripheral neuropathy, as was confirmed on his EMG.  There was no demyelinating features on EMG so prognosis of improvement is less.  I had extensive discussion with the patient and his daughter regarding the pathogenesis, etiology, management, and natural course of neuropathy. Neuropathy tends to be slowly progressive, especially if a treatable etiology is not identified.   He has been testing for diabetes  and vitamin B12 deficiency which was normal.   I would like to test for treatable causes of neuropathy. I discussed that in the vast majority of cases, despite checking for reversible causes, we are unable to find the underlying etiology and management is symptomatic.    Because he has "heavy sensation", I will screen for muscle disease as well as neuromuscular junction disorder, but given his normal proximal strength this is unlikely.  MRI lumbar spine was personally reviewed and does not show any marked canal or foraminal stenosis.   Daughter is seeking a second opinion because of the lack of treatable findings.  She had many questions which I answered to the best of my ability.  Of note, there are subtle parkinsonian features, which I will continue to follow clinically.  PLAN/RECOMMENDATIONS:  1.  Check CK, AChR antibodies, TSH, UPEP with IFE, zinc, copper 2.  Continue gabapentin as follows (caution up titration given CKD):    AM  PM  Week 1  111m  3078m Week 2  [id number] 3  300mg71m0mg 20mContinue physical therapy 4.  [facility], encouraged him to use gait assist devices 6.  Return to clinic in 3-4 months.   The duration of this appointment visit was 60 minutes of face-to-face time with the patient.  Greater than 50% of this time was spent in counseling, explanation of diagnosis, planning of further management, and coordination of care.   Thank you for allowing me to participate in patient's care.  If I can answer any additional questions, I would be pleased to do so.    Sincerely,    Tory Septer K. Sean Macwilliams,Posey Pronto

## 2015-07-22 ENCOUNTER — Telehealth: Payer: Self-pay | Admitting: *Deleted

## 2015-07-22 ENCOUNTER — Inpatient Hospital Stay (HOSPITAL_BASED_OUTPATIENT_CLINIC_OR_DEPARTMENT_OTHER): Admission: RE | Admit: 2015-07-22 | Payer: Medicare Other | Source: Ambulatory Visit

## 2015-07-22 NOTE — Telephone Encounter (Signed)
Called [name] (daughter) and left message that her dad's appointment at [facility] is October 31 at 9:40.  Instructed her to call them to get on cancellation list if she would like and to call me with any questions.   [facility]: [phone]

## 2015-07-22 NOTE — Progress Notes (Signed)
Note and referral sent.  Will call patient with appt time and date.  October 31 at 9:40.

## 2015-07-23 ENCOUNTER — Ambulatory Visit: Payer: Medicare Other | Admitting: Physical Therapy

## 2015-07-23 DIAGNOSIS — M48061 Spinal stenosis, lumbar region without neurogenic claudication: Secondary | ICD-10-CM

## 2015-07-23 DIAGNOSIS — R262 Difficulty in walking, not elsewhere classified: Secondary | ICD-10-CM

## 2015-07-23 DIAGNOSIS — M4806 Spinal stenosis, lumbar region: Secondary | ICD-10-CM | POA: Diagnosis not present

## 2015-07-23 LAB — UIFE/LIGHT CHAINS/TP QN, 24-HR UR
ALBUMIN, U: DETECTED
Alpha 1, Urine: DETECTED — AB
Alpha 2, Urine: DETECTED — AB
BETA UR: DETECTED — AB
GAMMA UR: DETECTED — AB
TOTAL PROTEIN, URINE-UPE24: 12 mg/dL (ref 5–25)

## 2015-07-23 LAB — SPEP & IFE WITH QIG
ALBUMIN ELP: 4 g/dL (ref 3.8–4.8)
ALPHA-2-GLOBULIN: 0.7 g/dL (ref 0.5–0.9)
Alpha-1-Globulin: 0.3 g/dL (ref 0.2–0.3)
BETA 2: 0.3 g/dL (ref 0.2–0.5)
Beta Globulin: 0.3 g/dL — ABNORMAL LOW (ref 0.4–0.6)
GAMMA GLOBULIN: 0.9 g/dL (ref 0.8–1.7)
IgA: 141 mg/dL (ref 68–379)
IgG (Immunoglobin G), Serum: 864 mg/dL (ref 650–1600)
IgM, Serum: 98 mg/dL (ref 41–251)
TOTAL PROTEIN, SERUM ELECTROPHOR: 6.5 g/dL (ref 6.1–8.1)

## 2015-07-23 NOTE — Therapy (Signed)
[facility] [address]  [place], [place], [address] Phone: [phone]   Fax:  [phone]  Physical Therapy Treatment  Patient Details  Name: [name] MRN: [id number] Date of Birth: Sep 23, 1927 Referring Provider:  Jessy Oto, MD  Encounter Date: 07/23/2015      PT End of Session - 07/23/15 0803    Visit Number 7   Number of Visits 12   Date for PT Re-Evaluation 08/14/15   PT Start Time 0800   PT Stop Time 0847   PT Time Calculation (min) 47 min   Activity Tolerance Patient tolerated treatment well   Behavior During Therapy [facility] for tasks assessed/performed      Past Medical History  Diagnosis Date  . Abdominal aortic aneurysm 09/2008    s/o endovascular repair and angiogram 10/04/08   . Bleeding ulcer 1997  . Leukopenia 2/11    thought to be from doxy  . Renal cell carcinoma     clear cell type dx 10/09, s/p excision 10-24-08  . Hypertension   . Renal insufficiency   . COPD (chronic obstructive pulmonary disease)   . Anemia     EGD Cscope 08-2012  . CKD (chronic kidney disease), stage IV     Stable, Dr. Dimas Aguas  . Solitary left kidney     W/ chronic tubule-interstitial damage, Dr. Dimas Aguas  . Spinal stenosis     causing LE weakness-persistant, being worked up by Hydrographic surveyor and neurologist  . H/O hyperkalemia     Stable    Past Surgical History  Procedure Laterality Date  . Mohs surgery      1997 (lip), 2004  . Hemorrhoid surgery  1990  . Nephrectomy  2009    Open nephrectomy for cancer  . Abdominal aortic aneurysm repair  2009    EVAR    There were no vitals filed for this visit.  Visit Diagnosis:  Spinal stenosis of lumbar region  Difficulty walking      Subjective Assessment - 07/23/15 0802    Subjective "Still just a little slow getting around."   Currently in Pain? No/denies            [facility] PT Assessment - 07/23/15 0807    ROM / Strength   AROM /  PROM / Strength Strength   Strength   Strength Assessment Site Hip   Right/Left Hip Right;Left   Right Hip Flexion 4/5   Right Hip ABduction 4-/5   Left Hip Flexion 4+/5   Left Hip ABduction 4+/5   6 Minute walk- Post Test   6 Minute Walk Post Test yes   HR (bpm) 88   02 Sat (%RA) 95 %   Modified Borg Scale for Dyspnea 1- Very mild shortness of breath   Perceived Rate of Exertion (Borg) 13- Somewhat hard   6 minute walk test results    Aerobic Endurance Distance Walked 930   Standardized Balance Assessment   Standardized Balance Assessment Berg Balance Test   Berg Balance Test   Sit to Stand Able to stand without using hands and stabilize independently   Standing Unsupported Able to stand safely 2 minutes   Sitting with Back Unsupported but Feet Supported on Floor or Stool Able to sit safely and securely 2 minutes   Stand to Sit Sits safely with minimal use of hands   Transfers Able to transfer safely, minor use of hands   Standing Unsupported with Eyes Closed Able to  stand 10 seconds safely   Standing Ubsupported with Feet Together Able to place feet together independently and stand for 1 minute with supervision   From Standing, Reach Forward with Outstretched Arm Can reach confidently >25 cm (10")   From Standing Position, Pick up Object from [place] to pick up shoe safely and easily   From Standing Position, Turn to Look Behind Over each Shoulder Looks behind one side only/other side shows less weight shift   Turn 360 Degrees Able to turn 360 degrees safely one side only in 4 seconds or less   Standing Unsupported, Alternately Place Feet on Step/Stool Able to stand independently and safely and complete 8 steps in 20 seconds   Standing Unsupported, One Foot in Front Needs help to step but can hold 15 seconds   Standing on One Leg Able to lift leg independently and hold equal to or more than 3 seconds   Total Score 48            TODAY'S TREATMENT:  Ther Ex: Rec Bike  level 0-1 x 6' Bridging x15 Standing hip flexion with green TB x10 bilaterally with RW for UE support Standing hip abduction with green TB x10 bilaterally with RW for UE support Standing hip extension with green TB x10 bilaterally with RW for UE support Slow sit to stand with eccentric control and no HHA x10   Neuro: Berg Tandem gait on Blue Foam strip x5 with single HHA/CGA Sidestepping on Blue Foam strip x 5, initially with bilateral HHA weaning to single HHA SLS on Blue foam with toe touch forward/side/back with 2 pole support, x5 bilateral   Gait  6 minute walk test            PT Long Term Goals - 07/23/15 0805    PT LONG TERM GOAL #1   Title Pt will improve 19min walk time to being able to walk the complete 6 minutes with at least 900 feet completed (08/14/15)   Status On-going   PT LONG TERM GOAL #3   Title pt will improve R hip strength to 4/5 or greater (08/14/15)   Status On-going   PT LONG TERM GOAL #4   Title pt will return to walking at least 1.5 miles at the gym   Status On-going               Plan - 07/23/15 1610    Clinical Impression Statement LE strength gradually improving along with improving endurance/ambulation tolerance. Patient reports previously walking on the treadmill at the gym before current physical limitations prevented him from doing so, and would like to get back to this. Will incorporate use of treadmill in future therapy sessions to ensure patient safety with use of treadmill before returing to use it at the gym.   PT Next Visit Plan lumbar stenosis POC, LE strengthening, Treadmill, balance as needed, walking tolerance   Consulted and Agree with Plan of Care Patient        Problem List Patient Active Problem List   Diagnosis Date Noted  . Neuropathy 06/10/2015  . Pedal edema 01/30/2015  . Hip pain, chronic 01/30/2015  . Back pain 01/30/2015  . Cramps of lower extremity 01/30/2015  . Aftercare following surgery of the  circulatory system, [place] 08/05/2014  . COPD exacerbation 01/10/2014  . Annual physical exam 10/19/2011  . DJD (degenerative joint disease) 12/14/2010  . Anemia 06/06/2009  . COPD (chronic obstructive pulmonary disease) 06/06/2009  . RENAL CELL CANCER 12/25/2008  .  HTN (hypertension) 12/25/2008  . AAA (abdominal aortic aneurysm) without rupture 12/25/2008  . CKD (chronic kidney disease) 12/25/2008  . HYPERCHOLESTEROLEMIA 03/01/2008  . GERD 03/01/2008  . ACNE ROSACEA 11/29/2007  . DIVERTICULOSIS, COLON 04/12/2007    Percival Spanish, PT, MPT 07/23/2015, 9:22 AM  [facility] [address]  [place], [place], [address] Phone: [phone]   Fax:  [phone]

## 2015-07-24 LAB — ZINC: Zinc: 91 ug/dL (ref 60–130)

## 2015-07-24 LAB — COPPER, SERUM: Copper: 94 ug/dL (ref 70–175)

## 2015-07-28 ENCOUNTER — Ambulatory Visit (HOSPITAL_BASED_OUTPATIENT_CLINIC_OR_DEPARTMENT_OTHER)
Admission: RE | Admit: 2015-07-28 | Discharge: 2015-07-28 | Disposition: A | Discharge status: Home / Self Care | Payer: Medicare Other | Source: Ambulatory Visit | Attending: Internal Medicine | Admitting: Internal Medicine

## 2015-07-28 ENCOUNTER — Ambulatory Visit: Payer: Medicare Other | Attending: Specialist | Admitting: Physical Therapy

## 2015-07-28 DIAGNOSIS — M8580 Other specified disorders of bone density and structure, unspecified site: Secondary | ICD-10-CM | POA: Insufficient documentation

## 2015-07-28 DIAGNOSIS — R262 Difficulty in walking, not elsewhere classified: Secondary | ICD-10-CM | POA: Insufficient documentation

## 2015-07-28 DIAGNOSIS — N189 Chronic kidney disease, unspecified: Secondary | ICD-10-CM | POA: Diagnosis present

## 2015-07-28 DIAGNOSIS — M899 Disorder of bone, unspecified: Secondary | ICD-10-CM | POA: Diagnosis not present

## 2015-07-28 DIAGNOSIS — R269 Unspecified abnormalities of gait and mobility: Secondary | ICD-10-CM | POA: Diagnosis not present

## 2015-07-28 DIAGNOSIS — M4806 Spinal stenosis, lumbar region: Secondary | ICD-10-CM | POA: Diagnosis not present

## 2015-07-28 DIAGNOSIS — R2681 Unsteadiness on feet: Secondary | ICD-10-CM | POA: Diagnosis not present

## 2015-07-28 DIAGNOSIS — R279 Unspecified lack of coordination: Secondary | ICD-10-CM | POA: Insufficient documentation

## 2015-07-28 DIAGNOSIS — M48061 Spinal stenosis, lumbar region without neurogenic claudication: Secondary | ICD-10-CM

## 2015-07-28 LAB — MYASTHENIA GRAVIS PANEL 2
Acetylcholine Rec Binding: <0.3 nmol/L
Acetylcholine Rec Mod Ab: 10 % binding inhibition

## 2015-07-28 NOTE — Therapy (Signed)
[facility] [address]  [place], [place], [address] Phone: [phone]   Fax:  [phone]  Physical Therapy Treatment  Patient Details  Name: [name] MRN: [id number] Date of Birth: 07-05-1927 Referring Provider:  Jessy Oto, MD  Encounter Date: 07/28/2015      PT End of Session - 07/28/15 1401    Visit Number 8   Number of Visits 12   Date for PT Re-Evaluation 08/14/15   PT Start Time 1351   PT Stop Time 1438   PT Time Calculation (min) 47 min   Activity Tolerance Patient tolerated treatment well   Behavior During Therapy [facility] for tasks assessed/performed      Past Medical History  Diagnosis Date  . Abdominal aortic aneurysm 09/2008    s/o endovascular repair and angiogram 10/04/08   . Bleeding ulcer 1997  . Leukopenia 2/11    thought to be from doxy  . Renal cell carcinoma     clear cell type dx 10/09, s/p excision 10-24-08  . Hypertension   . Renal insufficiency   . COPD (chronic obstructive pulmonary disease)   . Anemia     EGD Cscope 08-2012  . CKD (chronic kidney disease), stage IV     Stable, Dr. Dimas Aguas  . Solitary left kidney     W/ chronic tubule-interstitial damage, Dr. Dimas Aguas  . Spinal stenosis     causing LE weakness-persistant, being worked up by Hydrographic surveyor and neurologist  . H/O hyperkalemia     Stable    Past Surgical History  Procedure Laterality Date  . Mohs surgery      1997 (lip), 2004  . Hemorrhoid surgery  1990  . Nephrectomy  2009    Open nephrectomy for cancer  . Abdominal aortic aneurysm repair  2009    EVAR    There were no vitals filed for this visit.  Visit Diagnosis:  Spinal stenosis of lumbar region  Difficulty walking      Subjective Assessment - 07/28/15 1354    Subjective "Nothing hurts, just get a little tired."   Currently in Pain? No/denies         TODAY'S TREATMENT:  Ther Ex: Treadmill - up to 1.4 mph x 6 min (no  incline) Seated One Arm Row Green TB doubled x15 each then Bilateral Low Row Green TB x15 Seated Diagonal lifts focusing on upright posture with Green TB x10 Seated OH lifts focusing on Trunk Extension/upright posture with red med ball (1000gr) x10 Standing hip flexion with green TB x10 bilaterally with RW for UE support Standing hip abduction with green TB x10 bilaterally with RW for UE support Standing hip extension with green TB x10 bilaterally with RW for UE support Slow sit to stand with eccentric control and no HHA x10  Neuro: Side-stepping over 1/2 foam roll x10 bilaterally Standing March on Blue Foam x20 with 2 poles for support Fwd step-up and over on Blue Foam x 6, no UE support Side-stepping up and over Blue Foam x 6, no UE support SLS on Blue foam oval with toe touch forward/side/back with 2 pole support, x5 bilateral           PT Long Term Goals - 07/23/15 0805    PT LONG TERM GOAL #1   Title Pt will improve 46min walk time to being able to walk the complete 6 minutes with at least 900 feet completed (08/14/15)   Status On-going  PT LONG TERM GOAL #3   Title pt will improve R hip strength to 4/5 or greater (08/14/15)   Status On-going   PT LONG TERM GOAL #4   Title pt will return to walking at least 1.5 miles at the gym   Status On-going               Plan - 07/28/15 1442    Clinical Impression Statement Transitioned warm-up to treadmill secondary to patient desire to return to walking on the treadmill on his own at the gym, with patient fatigued after 6 minute warm-up requiring breif rest before proceeding with exercises and balance activities. Able to progress balance activities but repeated cues for posture and coordination to avoid tripping self.   PT Next Visit Plan lumbar stenosis POC, LE strengthening, Treadmill, balance training, walking tolerance   Consulted and Agree with Plan of Care Patient        Problem List Patient Active Problem List    Diagnosis Date Noted  . Neuropathy 06/10/2015  . Pedal edema 01/30/2015  . Hip pain, chronic 01/30/2015  . Back pain 01/30/2015  . Cramps of lower extremity 01/30/2015  . Aftercare following surgery of the circulatory system, [place] 08/05/2014  . COPD exacerbation 01/10/2014  . Annual physical exam 10/19/2011  . DJD (degenerative joint disease) 12/14/2010  . Anemia 06/06/2009  . COPD (chronic obstructive pulmonary disease) 06/06/2009  . RENAL CELL CANCER 12/25/2008  . HTN (hypertension) 12/25/2008  . AAA (abdominal aortic aneurysm) without rupture 12/25/2008  . CKD (chronic kidney disease) 12/25/2008  . HYPERCHOLESTEROLEMIA 03/01/2008  . GERD 03/01/2008  . ACNE ROSACEA 11/29/2007  . DIVERTICULOSIS, COLON 04/12/2007    Percival Spanish, PT, MPT 07/28/2015, 2:48 PM  [facility] [address]  [place], [place], [address] Phone: [phone]   Fax:  [phone]

## 2015-07-29 ENCOUNTER — Encounter: Payer: Medicare Other | Admitting: Physical Therapy

## 2015-07-31 ENCOUNTER — Ambulatory Visit: Payer: Medicare Other | Admitting: Rehabilitation

## 2015-07-31 DIAGNOSIS — M48061 Spinal stenosis, lumbar region without neurogenic claudication: Secondary | ICD-10-CM

## 2015-07-31 DIAGNOSIS — R262 Difficulty in walking, not elsewhere classified: Secondary | ICD-10-CM

## 2015-07-31 DIAGNOSIS — R279 Unspecified lack of coordination: Secondary | ICD-10-CM | POA: Diagnosis not present

## 2015-07-31 DIAGNOSIS — R269 Unspecified abnormalities of gait and mobility: Secondary | ICD-10-CM | POA: Diagnosis not present

## 2015-07-31 DIAGNOSIS — R2681 Unsteadiness on feet: Secondary | ICD-10-CM | POA: Diagnosis not present

## 2015-07-31 DIAGNOSIS — M4806 Spinal stenosis, lumbar region: Secondary | ICD-10-CM | POA: Diagnosis not present

## 2015-07-31 NOTE — Therapy (Signed)
[facility] [address]  [place], [place], [address] Phone: [phone]   Fax:  [phone]  Physical Therapy Treatment  Patient Details  Name: [name] MRN: [id number] Date of Birth: 1927/05/31 Referring Provider:  Jessy Oto, MD  Encounter Date: 07/31/2015      PT End of Session - 07/31/15 1346    Visit Number 9   Number of Visits 12   Date for PT Re-Evaluation 08/14/15   PT Start Time 7616   PT Stop Time 1427   PT Time Calculation (min) 40 min      Past Medical History  Diagnosis Date  . Abdominal aortic aneurysm 09/2008    s/o endovascular repair and angiogram 10/04/08   . Bleeding ulcer 1997  . Leukopenia 2/11    thought to be from doxy  . Renal cell carcinoma     clear cell type dx 10/09, s/p excision 10-24-08  . Hypertension   . Renal insufficiency   . COPD (chronic obstructive pulmonary disease)   . Anemia     EGD Cscope 08-2012  . CKD (chronic kidney disease), stage IV     Stable, Dr. Dimas Aguas  . Solitary left kidney     W/ chronic tubule-interstitial damage, Dr. Dimas Aguas  . Spinal stenosis     causing LE weakness-persistant, being worked up by Hydrographic surveyor and neurologist  . H/O hyperkalemia     Stable    Past Surgical History  Procedure Laterality Date  . Mohs surgery      1997 (lip), 2004  . Hemorrhoid surgery  1990  . Nephrectomy  2009    Open nephrectomy for cancer  . Abdominal aortic aneurysm repair  2009    EVAR    There were no vitals filed for this visit.  Visit Diagnosis:  Spinal stenosis of lumbar region  Difficulty walking      Subjective Assessment - 07/31/15 1355    Subjective Denies pain, reports feeling good after last time.    Currently in Pain? No/denies      TODAY'S TREATMENT:  Ther Ex: Treadmill - 1.4-1.5 mph x 6 min (no incline) Seated OH lifts focusing on Trunk Extension/upright posture with red med ball (1000gr) x10 Seated  Diagonal lifts focusing on upright posture with red med bal (1000gr) x10 Standing hip abduction with green TB x10 bilaterally with RW for UE support Standing hip extension with green TB x10 bilaterally with RW for UE support Seated One Arm Row Green TB doubled x15 each then Bilateral Low Row Green TB x15 Seated Single Hip Abduction with Green TB 2x10 each Slow sit to stand with eccentric control and no HHA x10  Neuro: Side-stepping over 1/2 foam roll x10 bilaterally Fwd/Bwd stepping over 1/2 foam roll x10 bilateraly with 1 HHA on chair as needed Standing March on Blue Foam x20 with 2 poles for support Standing on Blue Foam with toe taps to each side x10 with HHA on chair as needed Standing on Blue Foam with toe taps to 8" Step x10 with 2 HHA on poles, x10 with 1 HHA on pole       PT Long Term Goals - 07/23/15 0805    PT LONG TERM GOAL #1   Title Pt will improve 39min walk time to being able to walk the complete 6 minutes with at least 900 feet completed (08/14/15)   Status On-going   PT LONG TERM GOAL #3   Title pt will  improve R hip strength to 4/5 or greater (08/14/15)   Status On-going   PT LONG TERM GOAL #4   Title pt will return to walking at least 1.5 miles at the gym   Status On-going               Plan - 07/31/15 1428    Clinical Impression Statement Tested MMT in sitting with all MMT being 4/5 or better. Will check with pt supine/side-lying at a later date and recheck goa. Good tolerance to treadmill today with pt asking to increase speed. Still needs cueing for balance exercises and proper form.   PT Next Visit Plan lumbar stenosis POC, LE strengthening, Treadmill, balance training, walking tolerance, G-code   Consulted and Agree with Plan of Care Patient        Problem List Patient Active Problem List   Diagnosis Date Noted  . Neuropathy 06/10/2015  . Pedal edema 01/30/2015  . Hip pain, chronic 01/30/2015  . Back pain 01/30/2015  . Cramps of lower  extremity 01/30/2015  . Aftercare following surgery of the circulatory system, [place] 08/05/2014  . COPD exacerbation 01/10/2014  . Annual physical exam 10/19/2011  . DJD (degenerative joint disease) 12/14/2010  . Anemia 06/06/2009  . COPD (chronic obstructive pulmonary disease) 06/06/2009  . RENAL CELL CANCER 12/25/2008  . HTN (hypertension) 12/25/2008  . AAA (abdominal aortic aneurysm) without rupture 12/25/2008  . CKD (chronic kidney disease) 12/25/2008  . HYPERCHOLESTEROLEMIA 03/01/2008  . GERD 03/01/2008  . ACNE ROSACEA 11/29/2007  . DIVERTICULOSIS, COLON 04/12/2007    Barbette Hair, PTA 07/31/2015, 2:31 PM  [facility] [address]  [place], [place], [address] Phone: [phone]   Fax:  [phone]

## 2015-08-04 ENCOUNTER — Other Ambulatory Visit (HOSPITAL_COMMUNITY): Payer: Self-pay | Admitting: Specialist

## 2015-08-04 DIAGNOSIS — T827XXD Infection and inflammatory reaction due to other cardiac and vascular devices, implants and grafts, subsequent encounter: Secondary | ICD-10-CM

## 2015-08-04 DIAGNOSIS — M4806 Spinal stenosis, lumbar region: Secondary | ICD-10-CM | POA: Diagnosis not present

## 2015-08-04 DIAGNOSIS — M79604 Pain in right leg: Secondary | ICD-10-CM | POA: Diagnosis not present

## 2015-08-04 DIAGNOSIS — M810 Age-related osteoporosis without current pathological fracture: Secondary | ICD-10-CM | POA: Diagnosis not present

## 2015-08-05 ENCOUNTER — Ambulatory Visit: Payer: Medicare Other | Admitting: Physical Therapy

## 2015-08-05 ENCOUNTER — Encounter (HOSPITAL_COMMUNITY): Payer: Medicare Other

## 2015-08-06 ENCOUNTER — Ambulatory Visit (HOSPITAL_COMMUNITY)
Admission: RE | Admit: 2015-08-06 | Discharge: 2015-08-06 | Disposition: A | Discharge status: Home / Self Care | Payer: Medicare Other | Source: Ambulatory Visit | Attending: Specialist | Admitting: Specialist

## 2015-08-06 DIAGNOSIS — Z8679 Personal history of other diseases of the circulatory system: Secondary | ICD-10-CM | POA: Insufficient documentation

## 2015-08-06 DIAGNOSIS — R5383 Other fatigue: Secondary | ICD-10-CM | POA: Diagnosis not present

## 2015-08-06 DIAGNOSIS — T827XXD Infection and inflammatory reaction due to other cardiac and vascular devices, implants and grafts, subsequent encounter: Secondary | ICD-10-CM

## 2015-08-06 NOTE — Progress Notes (Signed)
VASCULAR LAB PRELIMINARY  PRELIMINARY  PRELIMINARY  PRELIMINARY  VASCULAR LAB PRELIMINARY  ARTERIAL  ABI completed:    RIGHT    LEFT    PRESSURE WAVEFORM  PRESSURE WAVEFORM  BRACHIAL 175 Triphasic BRACHIAL 177 Triphasic  DP 200 Triphasic DP 181 Triphasic  PT 197 Triphasic PT 181 Triphasic    RIGHT LEFT  ABI 1.1 1.0     Modell Fendrick, Bonnye Fava, RVT 08/06/2015, 9:35 AM    Marlow Hendrie, Bonnye Fava, RVT 08/06/2015, 9:34 AM

## 2015-08-07 ENCOUNTER — Ambulatory Visit: Payer: Medicare Other | Admitting: Physical Therapy

## 2015-08-07 DIAGNOSIS — R262 Difficulty in walking, not elsewhere classified: Secondary | ICD-10-CM | POA: Diagnosis not present

## 2015-08-07 DIAGNOSIS — M48061 Spinal stenosis, lumbar region without neurogenic claudication: Secondary | ICD-10-CM

## 2015-08-07 DIAGNOSIS — M4806 Spinal stenosis, lumbar region: Secondary | ICD-10-CM | POA: Diagnosis not present

## 2015-08-07 DIAGNOSIS — R269 Unspecified abnormalities of gait and mobility: Secondary | ICD-10-CM | POA: Diagnosis not present

## 2015-08-07 DIAGNOSIS — R2681 Unsteadiness on feet: Secondary | ICD-10-CM | POA: Diagnosis not present

## 2015-08-07 DIAGNOSIS — R279 Unspecified lack of coordination: Secondary | ICD-10-CM | POA: Diagnosis not present

## 2015-08-07 NOTE — Therapy (Signed)
[facility] [address]  [place], [place], [address] Phone: [phone]   Fax:  [phone]  Physical Therapy Treatment  Patient Details  Name: [name] MRN: [id number] Date of Birth: May 24, 1927 Referring Provider:  Jessy Oto, MD  Encounter Date: 08/07/2015      PT End of Session - 08/07/15 1429    Visit Number 10   Number of Visits 12   Date for PT Re-Evaluation 08/14/15   PT Start Time 1400   PT Stop Time 1444   PT Time Calculation (min) 44 min   Activity Tolerance Patient tolerated treatment well   Behavior During Therapy [facility] for tasks assessed/performed      Past Medical History  Diagnosis Date  . Abdominal aortic aneurysm 09/2008    s/o endovascular repair and angiogram 10/04/08   . Bleeding ulcer 1997  . Leukopenia 2/11    thought to be from doxy  . Renal cell carcinoma     clear cell type dx 10/09, s/p excision 10-24-08  . Hypertension   . Renal insufficiency   . COPD (chronic obstructive pulmonary disease)   . Anemia     EGD Cscope 08-2012  . CKD (chronic kidney disease), stage IV     Stable, Dr. Dimas Aguas  . Solitary left kidney     W/ chronic tubule-interstitial damage, Dr. Dimas Aguas  . Spinal stenosis     causing LE weakness-persistant, being worked up by Hydrographic surveyor and neurologist  . H/O hyperkalemia     Stable    Past Surgical History  Procedure Laterality Date  . Mohs surgery      1997 (lip), 2004  . Hemorrhoid surgery  1990  . Nephrectomy  2009    Open nephrectomy for cancer  . Abdominal aortic aneurysm repair  2009    EVAR    There were no vitals filed for this visit.  Visit Diagnosis:  Spinal stenosis of lumbar region  Difficulty walking      Subjective Assessment - 08/07/15 1406    Subjective Reports no pain, just "heaviness" and "tired" feeling. Dtr reports patient had LE dopplers yesterday - to see MD for results next week. Dtr also reports  neurologist & GP attempting to adjust Gabapentin dosage to achieve higher level while mitigating side-effects, especially lethargy. Dtr states MD has requested to continue therapy for another 4 weeks at the end of the current certification period.   Currently in Pain? No/denies            [facility] PT Assessment - 08/07/15 1407    Observation/Other Assessments   Focus on Therapeutic Outcomes (FOTO)  41% (59% limited)  FOTO-10   ROM / Strength   AROM / PROM / Strength Strength   Strength   Strength Assessment Site Hip   Right/Left Hip Right;Left   Right Hip Flexion 4/5  tested in sitting   Right Hip ABduction 4+/5  tested in sitting   Left Hip Flexion 4+/5  tested in sitting   Left Hip ABduction 4+/5  tested in sitting            TODAY'S TREATMENT:  Ther Ex: Treadmill - 1.4 mph x 6 min (no incline) Seated OH lifts focusing on Trunk Extension/upright posture with yellow med ball (2000gr) x10 Seated Diagonal lifts focusing on upright posture with red med bal (1000gr) x10 Seated One Arm Crossbody Row Green TB doubled x15 each then Bilateral Low Row Blue TB x15 Seated Single  Hip Abduction with Blue TB 2x10 each Seated hip flexion with Blue TB x10 bilaterally  Standing hip extension with Blue TB x10 bilaterally with RW for UE support Slow sit to stand with eccentric control and no HHA x10  Neuro: Side-stepping over 1/2 foam roll x10 bilaterally Fwd/Bwd stepping over 1/2 foam roll x10 bilateraly with 1 HHA on chair as needed Standing March on Blue Foam x20 with 2 poles for support Standing on Blue Foam with toe taps forward/side/back x5 each side with 2 HHA on poles Standing on Blue Foam with toe taps to 8" Step x10 with 1 HHA on pole Side-stepping up and over Blue Foam x 6, no UE support               PT Long Term Goals - 08/07/15 1505    PT LONG TERM GOAL #1   Title Pt will improve 20min walk time to being able to walk the complete 6 minutes with at least 900  feet completed (08/14/15)   Status On-going   PT LONG TERM GOAL #2   Title pt will demonstrate safe RW use in clinic to improve community ambulation endurance   Status On-going   PT LONG TERM GOAL #3   Title pt will improve R hip strength to 4/5 or greater (08/14/15)   Status On-going   PT LONG TERM GOAL #4   Title pt will return to walking at least 1.5 miles at the gym   Status On-going               Plan - 08/07/15 1455    Clinical Impression Statement Patient continues to deny pain but reports sense of "heaviness" in legs and feeling "more tired". Daughter reports all testing so far has not revealed source of patient's complaints, with most recent test being LE dopplers for which patient will see MD next week for results. Daughter did report MD has been working on adjusting Gabapentin (Neurontin) dose to see if that will help alleviate some of patient's complaints without increasing lethargy. Patient able to tolerate increased resistance with some exercises today but still requires frequent rest breaks. Reports balance activities are "getting easier".   PT Next Visit Plan lumbar stenosis POC, LE strengthening, Treadmill, balance training, walking tolerance; on 12th visit (6/81) - recert for additional 4 wks per MD request   Consulted and Agree with Plan of Care Patient;Family member/caregiver   Family Member Consulted Daughter        Problem List Patient Active Problem List   Diagnosis Date Noted  . Neuropathy 06/10/2015  . Pedal edema 01/30/2015  . Hip pain, chronic 01/30/2015  . Back pain 01/30/2015  . Cramps of lower extremity 01/30/2015  . Aftercare following surgery of the circulatory system, [place] 08/05/2014  . COPD exacerbation 01/10/2014  . Annual physical exam 10/19/2011  . DJD (degenerative joint disease) 12/14/2010  . Anemia 06/06/2009  . COPD (chronic obstructive pulmonary disease) 06/06/2009  . RENAL CELL CANCER 12/25/2008  . HTN (hypertension) 12/25/2008  .  AAA (abdominal aortic aneurysm) without rupture 12/25/2008  . CKD (chronic kidney disease) 12/25/2008  . HYPERCHOLESTEROLEMIA 03/01/2008  . GERD 03/01/2008  . ACNE ROSACEA 11/29/2007  . DIVERTICULOSIS, COLON 04/12/2007    Percival Spanish, PT, MPT 08/07/2015, 3:07 PM  [facility] [address]  [place], [place], [address] Phone: [phone]   Fax:  [phone]

## 2015-08-12 ENCOUNTER — Ambulatory Visit: Payer: Medicare Other | Admitting: Internal Medicine

## 2015-08-13 ENCOUNTER — Ambulatory Visit: Payer: Medicare Other | Admitting: Physical Therapy

## 2015-08-13 DIAGNOSIS — R2681 Unsteadiness on feet: Secondary | ICD-10-CM | POA: Diagnosis not present

## 2015-08-13 DIAGNOSIS — R279 Unspecified lack of coordination: Secondary | ICD-10-CM

## 2015-08-13 DIAGNOSIS — M4806 Spinal stenosis, lumbar region: Secondary | ICD-10-CM | POA: Diagnosis not present

## 2015-08-13 DIAGNOSIS — R262 Difficulty in walking, not elsewhere classified: Secondary | ICD-10-CM | POA: Diagnosis not present

## 2015-08-13 DIAGNOSIS — R269 Unspecified abnormalities of gait and mobility: Secondary | ICD-10-CM

## 2015-08-13 NOTE — Therapy (Signed)
[facility] [address]  [place], [place], [address] Phone: [phone]   Fax:  [phone]  Physical Therapy Treatment  Patient Details  Name: [name] MRN: [id number] Date of Birth: 1927-01-04 Referring Provider:  Jessy Oto, MD  Encounter Date: 08/13/2015      PT End of Session - 08/13/15 1349    Visit Number 11   Number of Visits 20   Date for PT Re-Evaluation 09/12/15   PT Start Time 0800   PT Stop Time 0846   PT Time Calculation (min) 46 min   Activity Tolerance Patient tolerated treatment well   Behavior During Therapy [facility] for tasks assessed/performed      Past Medical History  Diagnosis Date  . Abdominal aortic aneurysm 09/2008    s/o endovascular repair and angiogram 10/04/08   . Bleeding ulcer 1997  . Leukopenia 2/11    thought to be from doxy  . Renal cell carcinoma     clear cell type dx 10/09, s/p excision 10-24-08  . Hypertension   . Renal insufficiency   . COPD (chronic obstructive pulmonary disease)   . Anemia     EGD Cscope 08-2012  . CKD (chronic kidney disease), stage IV     Stable, Dr. Dimas Aguas  . Solitary left kidney     W/ chronic tubule-interstitial damage, Dr. Dimas Aguas  . Spinal stenosis     causing LE weakness-persistant, being worked up by Hydrographic surveyor and neurologist  . H/O hyperkalemia     Stable    Past Surgical History  Procedure Laterality Date  . Mohs surgery      1997 (lip), 2004  . Hemorrhoid surgery  1990  . Nephrectomy  2009    Open nephrectomy for cancer  . Abdominal aortic aneurysm repair  2009    EVAR    There were no vitals filed for this visit.  Visit Diagnosis:  Unsteadiness on feet  Lack of coordination  Abnormality of gait  Difficulty walking      Subjective Assessment - 08/13/15 1340    Subjective Patient continues to deny back pain or limitations related to back issues, however does report more weakness. New referral  received from MD to continue PT x 4 wks for generalized weakness in LE's, ? neuropathy, balance and coordination difficulty.   Currently in Pain? No/denies            [facility] PT Assessment - 08/13/15 0801    ROM / Strength   AROM / PROM / Strength Strength   Strength   Strength Assessment Site Hip   Right/Left Hip Right;Left   Right Hip Flexion 4-/5   Right Hip Extension 4-/5   Right Hip ABduction 4+/5   Right Hip ADduction 4/5   Left Hip Flexion 4/5   Left Hip Extension 4-/5   Left Hip ABduction 4+/5   Left Hip ADduction 4/5   Right/Left Knee Right;Left   Right Knee Flexion 4/5   Right Knee Extension 4+/5   Left Knee Flexion 4/5   Left Knee Extension 4+/5   6 Minute Walk- Baseline   HR (bpm) 81   02 Sat (%RA) 96 %   6 Minute walk- Post Test   HR (bpm) 83   02 Sat (%RA) 93 %   Modified Borg Scale for Dyspnea 1- Very mild shortness of breath   Perceived Rate of Exertion (Borg) 13- Somewhat hard   6 minute walk test results  Aerobic Endurance Distance Walked 990   Standardized Balance Assessment   Standardized Balance Assessment Berg Balance Test   Berg Balance Test   Sit to Stand Able to stand without using hands and stabilize independently   Standing Unsupported Able to stand safely 2 minutes   Sitting with Back Unsupported but Feet Supported on Floor or Stool Able to sit safely and securely 2 minutes   Stand to Sit Sits safely with minimal use of hands   Transfers Able to transfer safely, minor use of hands   Standing Unsupported with Eyes Closed Able to stand 10 seconds with supervision   Standing Ubsupported with Feet Together Able to place feet together independently and stand for 1 minute with supervision   From Standing, Reach Forward with Outstretched Arm Can reach confidently >25 cm (10")   From Standing Position, Pick up Object from [place] to pick up shoe safely and easily   From Standing Position, Turn to Look Behind Over each Shoulder Looks behind one  side only/other side shows less weight shift   Turn 360 Degrees Able to turn 360 degrees safely but slowly   Standing Unsupported, Alternately Place Feet on Step/Stool Able to stand independently and complete 8 steps >20 seconds   Standing Unsupported, One Foot in Front Able to take small step independently and hold 30 seconds   Standing on One Leg Tries to lift leg/unable to hold 3 seconds but remains standing independently   Total Score 45           TODAY'S TREATMENT:  Recertification   Ther Ex: 6 minute walk test Treadmill - 1.5 mph x 6 min (no incline) Slow sit to stand with eccentric control and no HHA x10  Neuro: Berg Balance Assessment SLS Standing on Blue Foam oval with toe taps forward/side/back x5 each side with 2 HHA on poles Staggered stance with forward foot on Blue foam while tossing beach ball             PT Long Term Goals - 08/13/15 1358    PT LONG TERM GOAL #1   Title Pt will improve 37min walk time to being able to walk the complete 6 minutes with at least 900 feet completed (08/14/15)   Status Achieved   PT LONG TERM GOAL #2   Title pt will demonstrate safe RW use in clinic to improve community ambulation endurance   Status Deferred   PT LONG TERM GOAL #3   Title pt will improve bilateral LE strength to 4/5 or greater (09/12/15)   Status Revised   PT LONG TERM GOAL #4   Title pt will return to walking at least 1.5 miles at the gym (09/12/15)   Status On-going   PT LONG TERM GOAL #5   Title Patient will demonstrate improvement on the Berg Balance Scale to 49/56 or greater to reduce risk for falls (09/12/15)   Status New               Plan - 08/13/15 1351    Clinical Impression Statement Patient originally for lumbar spinal stenosis but over the course of therapy so far, patient has denied pain in low back or legs. Patient does demonstrate generalized LE weakness but deficits appear to more balance and coordination related, therefore will  shift emphasis of POC to more LE strengthening and balance/coordination focus.   Pt will benefit from skilled therapeutic intervention in order to improve on the following deficits Abnormal gait;Decreased activity tolerance;Decreased balance;Decreased coordination;Decreased strength  Rehab Potential Good   PT Frequency 2x / week   PT Duration --  additional 4 weeks   PT Treatment/Interventions Therapeutic exercise;Neuromuscular re-education;Balance training;Therapeutic activities;Gait training   PT Next Visit Plan LE strengthening, balance/coordination training, walking/activity tolerance   Consulted and Agree with Plan of Care Patient;Family member/caregiver   Family Member Consulted Daughter          G-Codes - 09-11-15 1411    Functional Assessment Tool Used FOTO = 41% (59% limited)  Initial G-code based on FOTO assessment for Lumbar Spine however recent deficits are more balance/coordination and will be the focus of POC from this point on. Will D/C current G-code and initiate new G-code at next visit.   Functional Limitation Mobility: Walking and moving around   Mobility: Walking and Moving Around Current Status [phone]) At least 40 percent but less than 60 percent impaired, limited or restricted   Mobility: Walking and Moving Around Goal Status [phone]) At least 20 percent but less than 40 percent impaired, limited or restricted   Mobility: Walking and Moving Around Discharge Status [phone]) At least 40 percent but less than 60 percent impaired, limited or restricted      Problem List Patient Active Problem List   Diagnosis Date Noted  . Neuropathy 06/10/2015  . Pedal edema 01/30/2015  . Hip pain, chronic 01/30/2015  . Back pain 01/30/2015  . Cramps of lower extremity 01/30/2015  . Aftercare following surgery of the circulatory system, [place] 08/05/2014  . COPD exacerbation 01/10/2014  . Annual physical exam 10/19/2011  . DJD (degenerative joint disease) 12/14/2010  . Anemia  06/06/2009  . COPD (chronic obstructive pulmonary disease) 06/06/2009  . RENAL CELL CANCER 12/25/2008  . HTN (hypertension) 12/25/2008  . AAA (abdominal aortic aneurysm) without rupture 12/25/2008  . CKD (chronic kidney disease) 12/25/2008  . HYPERCHOLESTEROLEMIA 03/01/2008  . GERD 03/01/2008  . ACNE ROSACEA 11/29/2007  . DIVERTICULOSIS, COLON 04/12/2007    Percival Spanish, PT, MPT 2015-09-11, 2:17 PM  [facility] [address]  [place], [place], [address] Phone: [phone]   Fax:  [phone]

## 2015-08-15 ENCOUNTER — Encounter: Payer: Self-pay | Admitting: Surgery

## 2015-08-18 ENCOUNTER — Ambulatory Visit
Admission: RE | Admit: 2015-08-18 | Discharge: 2015-08-18 | Disposition: A | Discharge status: Home / Self Care | Payer: Medicare Other | Source: Ambulatory Visit | Attending: Surgery | Admitting: Surgery

## 2015-08-18 ENCOUNTER — Other Ambulatory Visit: Payer: Medicare Other

## 2015-08-18 ENCOUNTER — Ambulatory Visit (INDEPENDENT_AMBULATORY_CARE_PROVIDER_SITE_OTHER): Payer: Medicare Other | Admitting: Surgery

## 2015-08-18 ENCOUNTER — Encounter: Payer: Self-pay | Admitting: Surgery

## 2015-08-18 VITALS — BP 146/84 | HR 63 | Temp 98.3°F | Resp 16 | Wt 177.1 lb

## 2015-08-18 DIAGNOSIS — Z48812 Encounter for surgical aftercare following surgery on the circulatory system: Secondary | ICD-10-CM

## 2015-08-18 DIAGNOSIS — I714 Abdominal aortic aneurysm, without rupture, unspecified: Secondary | ICD-10-CM

## 2015-08-18 NOTE — Addendum Note (Signed)
Addended by: Dorthula Rue L on: 08/18/2015 04:17 PM   Modules accepted: Orders

## 2015-08-18 NOTE — Progress Notes (Signed)
Patient name: [name] MRN: [id number] DOB: [birth year] Sex: male     Chief Complaint  Patient presents with  . Follow-up    6 month follow up, Patient daughter reports that his legs feel very heavy.   Marland Kitchen AAA    HISTORY OF PRESENT ILLNESS:  The patient is back today for follow-up of his abdominal aortic aneurysm which was repaired via an endovascular approach on 10/04/2008. Prior to that he had undergone right nephrectomy secondary to renal cell tumor.  He is down to approximately 20% of his solitary kidney.  I have been following him with serial noncontrast CT scans as we have had difficulty getting good ultrasound images. He reports no abdominal pain or back pain.  The patient's biggest issue has been that of a burning feeling in his feet.  He has been diagnosed with neuropathy.  He is been trying Neurontin without significant benefit.  He is also seen neurology. He does complain of some swelling in his legs and that his legs feel very heavy.  He is not wearing compression stockings because he has had difficulty putting them on. He is medically managed for hypertension. He suffers from COPD.  He is a former smoker.  Past Medical History  Diagnosis Date  . Abdominal aortic aneurysm 09/2008    s/o endovascular repair and angiogram 10/04/08   . Bleeding ulcer 1997  . Leukopenia 2/11    thought to be from doxy  . Renal cell carcinoma     clear cell type dx 10/09, s/p excision 10-24-08  . Hypertension   . Renal insufficiency   . COPD (chronic obstructive pulmonary disease)   . Anemia     EGD Cscope 08-2012  . CKD (chronic kidney disease), stage IV     Stable, Dr. Dimas Aguas  . Solitary left kidney     W/ chronic tubule-interstitial damage, Dr. Dimas Aguas  . Spinal stenosis     causing LE weakness-persistant, being worked up by Hydrographic surveyor and neurologist  . H/O hyperkalemia     Stable    Past Surgical History  Procedure Laterality Date  . Mohs surgery      1997  (lip), 2004  . Hemorrhoid surgery  1990  . Nephrectomy  2009    Open nephrectomy for cancer  . Abdominal aortic aneurysm repair  2009    EVAR    Social History   Social History  . Marital Status: Married    Spouse Name: N/A  . Number of Children: 3  . Years of Education: N/A   Occupational History  . retired    Social History Main Topics  . Smoking status: Former Smoker    Quit date: 12/28/2007  . Smokeless tobacco: Never Used     Comment: used to smoke 1.5 ppd  . Alcohol Use: No  . Drug Use: No  . Sexual Activity: Not on file   Other Topics Concern  . Not on file   Social History Narrative   Lives w/ wife who has dementia    Totally independent on ADL    Often came to office w/ his daughter July [phone]), his other daughter is  [name] , son is Ki                       Family History  Problem Relation Age of Onset  . Diabetes Father     ?  . Stroke Father   . Hypertension Father   .  Colon cancer Neg Hx   . Prostate cancer Neg Hx     Allergies as of 08/18/2015 - Review Complete 08/18/2015  Allergen Reaction Noted  . Baclofen Other (See Comments) 03/14/2015    Current Outpatient Prescriptions on File Prior to Visit  Medication Sig Dispense Refill  . Casanthranol-Docusate Sodium 30-100 MG CAPS Take by mouth.      . chlorthalidone (HYGROTON) 25 MG tablet Take 1 tablet (25 mg total) by mouth daily. 30 tablet 6  . ergocalciferol (VITAMIN D2) 50000 UNITS capsule Take 1 capsule (50,000 Units total) by mouth once a week.    . FeFum-FePo-FA-B Cmp-C-Zn-Mn-Cu (SE-TAN PLUS) 162-115.2-1 MG CAPS Take 1 tablet by mouth daily. Take 1 Tab  daily    . gabapentin (NEURONTIN) 300 MG capsule Take 1 capsule (300 mg total) by mouth 3 (three) times daily. (Patient taking differently: Take 300 mg by mouth. 100MG  in am and 300mg  in PM) 90 capsule 3  . hydrALAZINE (APRESOLINE) 25 MG tablet Take 1 tablet (25 mg total) by mouth 2 (two) times daily. 60 tablet 6  . labetalol  (NORMODYNE) 100 MG tablet Take 1 tablet (100 mg total) by mouth 2 (two) times daily. 60 tablet 6  . mometasone (NASONEX) 50 MCG/ACT nasal spray 2 sprays each nares q day (Patient not taking: Reported on 07/14/2015) 17 g 0  . Multiple Vitamins-Iron (MULTIVITAMIN/IRON PO) Take by mouth.       No current facility-administered medications on file prior to visit.     REVIEW OF SYSTEMS:  please see history of present illness, otherwise all negative  PHYSICAL EXAMINATION:   Vital signs are  Filed Vitals:   08/18/15 1513  BP: 148/88  Pulse: 63  Temp: 98.3 F (36.8 C)  TempSrc: Oral  Resp: 16  Weight: 177 lb 1 oz (80.315 kg)  SpO2: 96%   Body mass index is 26.93 kg/(m^2). General: The patient appears their stated age. HEENT:  No gross abnormalities Pulmonary:  Non labored breathing Musculoskeletal: There are no major deformities. Neurologic: No focal weakness or paresthesias are detected, Skin: There are no ulcer or rashes noted. Psychiatric: The patient has normal affect. Cardiovascular: There is a regular rate and rhythm without significant murmur appreciated. Palpable pedal pulses.  1+ pitting edema bilaterally.   Diagnostic Studies  I have reviewed his CT scan with the following findings: Status post endo graft repair of infrarenal abdominal aortic aneurysm. Aneurysmal sac is grossly stable in size, with maximum measured AP diameter of 6.1 cm. Assessment:  #1: Status post endovascular repair of abdominal aortic aneurysm #2: lower extremity neuropathy Plan:  #1: the size of the patient's aneurysm sac remained stable at 6.1 cm. I will continue to follow this on an annual basis.  There is a potential endoleak but cannot be evaluated because I cannot use IV contrast given his renal dysfunction. #2: I do not feel that there is an arterial cause for his neuropathy as he has palpable pedal pulses.  He does complain of his legs feeling heavy and he does have edema.  Therefore I have  recommended wearing 20-30 compression stockings to see if this helps alleviate some of his symptoms.  I have also encouraged him to talk to his medical doctors about increasing his Neurontin dose or possibly trying him on Lyrica.   Eldridge Abrahams, M.D. [facility] Office: [phone] Pager:  [phone]

## 2015-08-19 ENCOUNTER — Ambulatory Visit: Payer: Medicare Other | Admitting: Rehabilitation

## 2015-08-19 DIAGNOSIS — M4806 Spinal stenosis, lumbar region: Secondary | ICD-10-CM | POA: Diagnosis not present

## 2015-08-19 DIAGNOSIS — R279 Unspecified lack of coordination: Secondary | ICD-10-CM

## 2015-08-19 DIAGNOSIS — R2681 Unsteadiness on feet: Secondary | ICD-10-CM

## 2015-08-19 DIAGNOSIS — R269 Unspecified abnormalities of gait and mobility: Secondary | ICD-10-CM | POA: Diagnosis not present

## 2015-08-19 DIAGNOSIS — R262 Difficulty in walking, not elsewhere classified: Secondary | ICD-10-CM | POA: Diagnosis not present

## 2015-08-19 NOTE — Therapy (Signed)
[facility] [address]  [place], [place], [address] Phone: [phone]   Fax:  [phone]  Physical Therapy Treatment  Patient Details  Name: [name] MRN: [id number] Date of Birth: June 18, 1927 Referring Provider:  Jessy Oto, MD  Encounter Date: 08/19/2015      PT End of Session - 08/19/15 1313    Visit Number 12   Number of Visits 20   Date for PT Re-Evaluation 09/12/15   PT Start Time 6811   PT Stop Time 1352   PT Time Calculation (min) 40 min      Past Medical History  Diagnosis Date  . Abdominal aortic aneurysm 09/2008    s/o endovascular repair and angiogram 10/04/08   . Bleeding ulcer 1997  . Leukopenia 2/11    thought to be from doxy  . Renal cell carcinoma     clear cell type dx 10/09, s/p excision 10-24-08  . Hypertension   . Renal insufficiency   . COPD (chronic obstructive pulmonary disease)   . Anemia     EGD Cscope 08-2012  . CKD (chronic kidney disease), stage IV     Stable, Dr. Dimas Aguas  . Solitary left kidney     W/ chronic tubule-interstitial damage, Dr. Dimas Aguas  . Spinal stenosis     causing LE weakness-persistant, being worked up by Hydrographic surveyor and neurologist  . H/O hyperkalemia     Stable    Past Surgical History  Procedure Laterality Date  . Mohs surgery      1997 (lip), 2004  . Hemorrhoid surgery  1990  . Nephrectomy  2009    Open nephrectomy for cancer  . Abdominal aortic aneurysm repair  2009    EVAR    There were no vitals filed for this visit.  Visit Diagnosis:  Unsteadiness on feet  Lack of coordination  Abnormality of gait  Difficulty walking      Subjective Assessment - 08/19/15 1315    Subjective Reports feeling fine, no pain.    Currently in Pain? No/denies           TODAY'S TREATMENT:  Ther Ex: Treadmill - 1.5 mph x 6 min (no incline) Slow sit to stand with eccentric control and no HHA x10  Neuro: Standing on  Blue Foam with toe taps to 8" Step x10 with 2 HHA on poles then x10 with 1 HHA on pole 8" Step ups from Blue Foam x10 with 2 HHA on poles (required min-CGA at times) Lateral Step ups onto Blue Foam x10 each side with 2 HHA on poles Narrow Standing on Blue Foam x30" then with head turns (side/side, up/down) x6 each (CGA) Side-Stepping on Blue Balance Beam x4 laps (down and back =1) with HHA on chair and poles as needed (CGA) Fwd/Bwd stepping over Blue Balance Beam x10 bilateraly with 1 HHA on chair as needed (CGA) Side-stepping over Blue Balance Beam x10 bilaterally Tandem Standing on Blue Balance Beam 3x20" each with 1-2 HHA on chair as needed (CGA) SLS Standing on Blue Foam with toe taps forward/side/back x5 each side with 1 HHA on chair (CGA)       PT Long Term Goals - 08/19/15 1316    PT LONG TERM GOAL #1   Title Pt will improve 80min walk time to being able to walk the complete 6 minutes with at least 900 feet completed (08/14/15)   Status Achieved   PT LONG TERM GOAL #2  Title pt will demonstrate safe RW use in clinic to improve community ambulation endurance   Status Deferred   PT LONG TERM GOAL #3   Title pt will improve bilateral LE strength to 4/5 or greater (09/12/15)   Status Revised   PT LONG TERM GOAL #4   Title pt will return to walking at least 1.5 miles at the gym (09/12/15)   Status On-going   PT LONG TERM GOAL #5   Title Patient will demonstrate improvement on the Berg Balance Scale to 49/56 or greater to reduce risk for falls (09/12/15)   Status On-going               Plan - 08-27-15 1341    Clinical Impression Statement Pt displayed good tolerance to all activities but did need rest breaks periodically. Does need CGA with most balance activities but pt able to complete without LOB. He did have a few stumbles but was able to recover without assist.    PT Next Visit Plan LE strengthening, balance/coordination training, walking/activity tolerance   Consulted and  Agree with Plan of Care Patient          G-Codes - 08/27/2015 1608    Functional Assessment Tool Used Neuromuscular disorder FOTO = 40% (60% limitation)   Functional Limitation Mobility: Walking and moving around   Mobility: Walking and Moving Around Current Status [phone]) At least 60 percent but less than 80 percent impaired, limited or restricted   Mobility: Walking and Moving Around Goal Status [phone]) At least 40 percent but less than 60 percent impaired, limited or restricted  Goal: Neuromuscular FOTO = 52% (48% limitation)      Problem List Patient Active Problem List   Diagnosis Date Noted  . Neuropathy 06/10/2015  . Pedal edema 01/30/2015  . Hip pain, chronic 01/30/2015  . Back pain 01/30/2015  . Cramps of lower extremity 01/30/2015  . Aftercare following surgery of the circulatory system, [place] 08/05/2014  . COPD exacerbation 01/10/2014  . Annual physical exam 10/19/2011  . DJD (degenerative joint disease) 12/14/2010  . Anemia 06/06/2009  . COPD (chronic obstructive pulmonary disease) 06/06/2009  . RENAL CELL CANCER 12/25/2008  . HTN (hypertension) 12/25/2008  . AAA (abdominal aortic aneurysm) without rupture 12/25/2008  . CKD (chronic kidney disease) 12/25/2008  . HYPERCHOLESTEROLEMIA 03/01/2008  . GERD 03/01/2008  . ACNE ROSACEA 11/29/2007  . DIVERTICULOSIS, COLON 04/12/2007    Barbette Hair, PTA 08/27/15, 4:13 PM  [facility] [address]  [place], [place], [address] Phone: [phone]   Fax:  [phone]   Percival Spanish, PT, MPT 08/27/2015, 4:14 PM  [facility] [address]  [place], [place], [address] Phone: [phone]   Fax:  [phone]

## 2015-08-20 ENCOUNTER — Ambulatory Visit: Payer: Medicare Other | Admitting: Physical Therapy

## 2015-08-20 DIAGNOSIS — R279 Unspecified lack of coordination: Secondary | ICD-10-CM | POA: Diagnosis not present

## 2015-08-20 DIAGNOSIS — D61818 Other pancytopenia: Secondary | ICD-10-CM | POA: Diagnosis not present

## 2015-08-20 DIAGNOSIS — M4806 Spinal stenosis, lumbar region: Secondary | ICD-10-CM | POA: Diagnosis not present

## 2015-08-20 DIAGNOSIS — R2681 Unsteadiness on feet: Secondary | ICD-10-CM | POA: Diagnosis not present

## 2015-08-20 DIAGNOSIS — Z79899 Other long term (current) drug therapy: Secondary | ICD-10-CM | POA: Diagnosis not present

## 2015-08-20 DIAGNOSIS — R262 Difficulty in walking, not elsewhere classified: Secondary | ICD-10-CM

## 2015-08-20 DIAGNOSIS — D72819 Decreased white blood cell count, unspecified: Secondary | ICD-10-CM | POA: Diagnosis not present

## 2015-08-20 DIAGNOSIS — R269 Unspecified abnormalities of gait and mobility: Secondary | ICD-10-CM | POA: Diagnosis not present

## 2015-08-20 DIAGNOSIS — N189 Chronic kidney disease, unspecified: Secondary | ICD-10-CM | POA: Diagnosis not present

## 2015-08-20 DIAGNOSIS — D696 Thrombocytopenia, unspecified: Secondary | ICD-10-CM | POA: Diagnosis not present

## 2015-08-20 DIAGNOSIS — D649 Anemia, unspecified: Secondary | ICD-10-CM | POA: Diagnosis not present

## 2015-08-20 DIAGNOSIS — D631 Anemia in chronic kidney disease: Secondary | ICD-10-CM | POA: Diagnosis not present

## 2015-08-20 LAB — CBC AND DIFFERENTIAL
HCT: 31 % — AB (ref 41–53)
Hemoglobin: 10.8 g/dL — AB (ref 13.5–17.5)
NEUTROS ABS: 2 /uL
PLATELETS: 95 10*3/uL — AB (ref 150–399)
WBC: 3.4 10*3/mL

## 2015-08-20 NOTE — Therapy (Signed)
[facility] [address]  [place], [place], [address] Phone: [phone]   Fax:  [phone]  Physical Therapy Treatment  Patient Details  Name: [name] MRN: [id number] Date of Birth: February 14, 1927 Referring Provider:  Jessy Oto, MD  Encounter Date: 08/20/2015      PT End of Session - 08/20/15 0933    Visit Number 13   Number of Visits 20   Date for PT Re-Evaluation 09/12/15   PT Start Time 0930   PT Stop Time 1016   PT Time Calculation (min) 46 min   Activity Tolerance Patient tolerated treatment well   Behavior During Therapy [facility] for tasks assessed/performed      Past Medical History  Diagnosis Date  . Abdominal aortic aneurysm 09/2008    s/o endovascular repair and angiogram 10/04/08   . Bleeding ulcer 1997  . Leukopenia 2/11    thought to be from doxy  . Renal cell carcinoma     clear cell type dx 10/09, s/p excision 10-24-08  . Hypertension   . Renal insufficiency   . COPD (chronic obstructive pulmonary disease)   . Anemia     EGD Cscope 08-2012  . CKD (chronic kidney disease), stage IV     Stable, Dr. Dimas Aguas  . Solitary left kidney     W/ chronic tubule-interstitial damage, Dr. Dimas Aguas  . Spinal stenosis     causing LE weakness-persistant, being worked up by Hydrographic surveyor and neurologist  . H/O hyperkalemia     Stable    Past Surgical History  Procedure Laterality Date  . Mohs surgery      1997 (lip), 2004  . Hemorrhoid surgery  1990  . Nephrectomy  2009    Open nephrectomy for cancer  . Abdominal aortic aneurysm repair  2009    EVAR    There were no vitals filed for this visit.  Visit Diagnosis:  Unsteadiness on feet  Lack of coordination  Abnormality of gait  Difficulty walking      Subjective Assessment - 08/20/15 0932    Subjective Patient having more trouble with hearing today. Going to have his hearing aids checked this afternoon.   Currently in Pain?  No/denies           TODAY'S TREATMENT:  Ther Ex: Treadmill - 1.4 mph x 6 min (no incline) Slow sit to stand with eccentric control and no HHA x10, then with feet on Blue foam x5  Neuro: Standing on Blue Foam with toe taps to 8" Step x10 with 2 HHA on poles then x10 with 1 HHA on pole 8" Step ups from Blue Foam x10 with 2 HHA on poles, progressed to 1 HHA after 1st few reps  Lateral Step ups onto Blue Foam x10 each side with 2 HHA on poles, progressed to 1 HHA after 1st few reps Narrow BOS Rhomberg Standing on Blue Foam x30", then with head turns (side/side, up/down) x6 each (SBA), upper trunk rotation (side/side) x4 (SBA), eyes closed x 15" (SBA) Semi-tandem BOS Rhomberg Standing on Blue Foam x30", then with head turns (side/side, up/down) x6 each (CGA), eyes closed x 10" (CGA with LOB requiring PT assistance to correct) Side-stepping with forward momentum (diagonal step) over Blue Balance Beam x10 bilaterally Side-Stepping on Blue Balance Beam x4 laps (down and back =1) with minimal HHA of PT Tandem gait on Blue Balance Beam x6 with single HHA Tandem Standing on Blue Balance Beam 3x20" each with  1 HHA on chair as needed (CGA) SLS Standing on Blue Foam with toe taps forward/side/back x5 each side with 2 HHA on poles (SBA) Narrow BOS standing on Blue foam while tossing beach ball with intermittent HHA on back of chair x2'  Semi-tandem stance with forward foot on Blue foam while tossing beach ball with intermittent HHA on back of chair x1' each foot forward SLS with kicking beach ball alternating feet, initially with 1 HHA on chair x1', then without UE support x 1'            PT Long Term Goals - 08-31-2015 1316    PT LONG TERM GOAL #1   Title Pt will improve 45min walk time to being able to walk the complete 6 minutes with at least 900 feet completed (08/14/15)   Status Achieved   PT LONG TERM GOAL #2   Title pt will demonstrate safe RW use in clinic to improve community  ambulation endurance   Status Deferred   PT LONG TERM GOAL #3   Title pt will improve bilateral LE strength to 4/5 or greater (09/12/15)   Status Revised   PT LONG TERM GOAL #4   Title pt will return to walking at least 1.5 miles at the gym (09/12/15)   Status On-going   PT LONG TERM GOAL #5   Title Patient will demonstrate improvement on the Berg Balance Scale to 49/56 or greater to reduce risk for falls (09/12/15)   Status On-going               Plan - 08/20/15 1025    Clinical Impression Statement Patient tolerating progression of balance activities including decreasing need for UE support, increased UE activity while standing on uneven surface,and rhomberg stance activities with narrow BOS and semi-tandem stance with eyes open, but did experience 1 LOB requiring PT assistance to correct with eyes closed during semi-tandem rhomberg stance on blue foam mat.   PT Next Visit Plan LE strengthening, balance/coordination training, walking/activity tolerance   Consulted and Agree with Plan of Care Patient          G-Codes - August 31, 2015 1608    Functional Assessment Tool Used Neuromuscular disorder FOTO = 40% (60% limitation)   Functional Limitation Mobility: Walking and moving around   Mobility: Walking and Moving Around Current Status [phone]) At least 60 percent but less than 80 percent impaired, limited or restricted   Mobility: Walking and Moving Around Goal Status [phone]) At least 40 percent but less than 60 percent impaired, limited or restricted  Goal: Neuromuscular FOTO = 52% (48% limitation)      Problem List Patient Active Problem List   Diagnosis Date Noted  . Neuropathy 06/10/2015  . Pedal edema 01/30/2015  . Hip pain, chronic 01/30/2015  . Back pain 01/30/2015  . Cramps of lower extremity 01/30/2015  . Aftercare following surgery of the circulatory system, [place] 08/05/2014  . COPD exacerbation 01/10/2014  . Annual physical exam 10/19/2011  . DJD (degenerative joint  disease) 12/14/2010  . Anemia 06/06/2009  . COPD (chronic obstructive pulmonary disease) 06/06/2009  . RENAL CELL CANCER 12/25/2008  . HTN (hypertension) 12/25/2008  . AAA (abdominal aortic aneurysm) without rupture 12/25/2008  . CKD (chronic kidney disease) 12/25/2008  . HYPERCHOLESTEROLEMIA 03/01/2008  . GERD 03/01/2008  . ACNE ROSACEA 11/29/2007  . DIVERTICULOSIS, COLON 04/12/2007    Percival Spanish, PT, MPT 08/20/2015, 10:31 AM  [facility] [place]  [place], [place], [address] Phone: [phone]   Fax:  [phone]

## 2015-08-21 ENCOUNTER — Encounter: Payer: Medicare Other | Admitting: Physical Therapy

## 2015-08-25 ENCOUNTER — Ambulatory Visit: Payer: Medicare Other | Admitting: Physical Therapy

## 2015-08-25 DIAGNOSIS — R2681 Unsteadiness on feet: Secondary | ICD-10-CM | POA: Diagnosis not present

## 2015-08-25 DIAGNOSIS — M4806 Spinal stenosis, lumbar region: Secondary | ICD-10-CM | POA: Diagnosis not present

## 2015-08-25 DIAGNOSIS — R262 Difficulty in walking, not elsewhere classified: Secondary | ICD-10-CM | POA: Diagnosis not present

## 2015-08-25 DIAGNOSIS — R269 Unspecified abnormalities of gait and mobility: Secondary | ICD-10-CM

## 2015-08-25 DIAGNOSIS — R279 Unspecified lack of coordination: Secondary | ICD-10-CM | POA: Diagnosis not present

## 2015-08-25 NOTE — Therapy (Signed)
[facility] [address]  [place], [place], [address] Phone: [phone]   Fax:  [phone]  Physical Therapy Treatment  Patient Details  Name: [name] MRN: [id number] Date of Birth: 07/08/1927 Referring Provider:  Jessy Oto, MD  Encounter Date: 08/25/2015      PT End of Session - 08/25/15 1315    Visit Number 14   Number of Visits 20   Date for PT Re-Evaluation 09/12/15   PT Start Time 1311   PT Stop Time 1357   PT Time Calculation (min) 46 min   Activity Tolerance Patient tolerated treatment well   Behavior During Therapy [facility] for tasks assessed/performed      Past Medical History  Diagnosis Date  . Abdominal aortic aneurysm 09/2008    s/o endovascular repair and angiogram 10/04/08   . Bleeding ulcer 1997  . Leukopenia 2/11    thought to be from doxy  . Renal cell carcinoma     clear cell type dx 10/09, s/p excision 10-24-08  . Hypertension   . Renal insufficiency   . COPD (chronic obstructive pulmonary disease)   . Anemia     EGD Cscope 08-2012  . CKD (chronic kidney disease), stage IV     Stable, Dr. Dimas Aguas  . Solitary left kidney     W/ chronic tubule-interstitial damage, Dr. Dimas Aguas  . Spinal stenosis     causing LE weakness-persistant, being worked up by Hydrographic surveyor and neurologist  . H/O hyperkalemia     Stable    Past Surgical History  Procedure Laterality Date  . Mohs surgery      1997 (lip), 2004  . Hemorrhoid surgery  1990  . Nephrectomy  2009    Open nephrectomy for cancer  . Abdominal aortic aneurysm repair  2009    EVAR    There were no vitals filed for this visit.  Visit Diagnosis:  Unsteadiness on feet  Lack of coordination  Abnormality of gait  Difficulty walking      Subjective Assessment - 08/25/15 1314    Subjective Patient continues to c/o senstation of "heaviness" in LE's.   Currently in Pain? No/denies             TODAY'S  TREATMENT:  Ther Ex: Treadmill - 1.5 mph x 6 min (no incline) Slow sit to stand with eccentric control and no HHA x10 on Blue foam  Neuro: Narrow BOS Rhomberg Standing on Blue Foam x30", then with upper trunk rotation (side/side) x6 (SBA/CGA), eyes closed x 20" (SBA) Semi-tandem BOS Rhomberg Standing on Blue Foam x30", then with head turns (side/side, up/down) x6 each (CGA) - increased difficulty maintaining rhomberg position secondary to increased LOB; Unable to attempt eyes closed  SLS Standing on Blue Foam with toe taps forward/side/back x5 each side with 2# ankle cuff weights, 2 HHA on poles (SBA) Standing on Blue Foam with toe taps to 8" Step x20 with 2# abkle cuff wts, 1 HHA on pole 8" Step ups from Blue Foam x10 with 2# ankle cuff wts, 2 HHA on poles  Lateral Step ups from Blue Foam to 8" Step x10 each side with 2# ankle cuff weights, 2 HHA on poles SLS with kicking beach ball alternating feet while wearing 2# ankle cuff wts, initially with intermittent 1 HHA on chair 2x1' Narrow BOS standing on Blue foam while tossing beach ball with intermittent HHA on back of chair x2'  Semi-tandem stance with forward foot  on Blue foam while tossing beach ball with intermittent HHA on back of chair x1' each foot forward               PT Long Term Goals - 08/19/15 1316    PT LONG TERM GOAL #1   Title Pt will improve 63min walk time to being able to walk the complete 6 minutes with at least 900 feet completed (08/14/15)   Status Achieved   PT LONG TERM GOAL #2   Title pt will demonstrate safe RW use in clinic to improve community ambulation endurance   Status Deferred   PT LONG TERM GOAL #3   Title pt will improve bilateral LE strength to 4/5 or greater (09/12/15)   Status Revised   PT LONG TERM GOAL #4   Title pt will return to walking at least 1.5 miles at the gym (09/12/15)   Status On-going   PT LONG TERM GOAL #5   Title Patient will demonstrate improvement on the Berg Balance  Scale to 49/56 or greater to reduce risk for falls (09/12/15)   Status On-going               Plan - 08/25/15 1359    Clinical Impression Statement Patient with increased difficulty with Rhomberg semi-tandem stance today. 2# cuff weight added to several standing balance activities for added resistance/strengthening while challenging balance, with good tolerance by patient for added weight.   PT Next Visit Plan LE strengthening, balance/coordination training, walking/activity tolerance   Consulted and Agree with Plan of Care Patient        Problem List Patient Active Problem List   Diagnosis Date Noted  . Neuropathy 06/10/2015  . Pedal edema 01/30/2015  . Hip pain, chronic 01/30/2015  . Back pain 01/30/2015  . Cramps of lower extremity 01/30/2015  . Aftercare following surgery of the circulatory system, [place] 08/05/2014  . COPD exacerbation 01/10/2014  . Annual physical exam 10/19/2011  . DJD (degenerative joint disease) 12/14/2010  . Anemia 06/06/2009  . COPD (chronic obstructive pulmonary disease) 06/06/2009  . RENAL CELL CANCER 12/25/2008  . HTN (hypertension) 12/25/2008  . AAA (abdominal aortic aneurysm) without rupture 12/25/2008  . CKD (chronic kidney disease) 12/25/2008  . HYPERCHOLESTEROLEMIA 03/01/2008  . GERD 03/01/2008  . ACNE ROSACEA 11/29/2007  . DIVERTICULOSIS, COLON 04/12/2007    Percival Spanish, PT, MPT 08/25/2015, 2:26 PM  [facility] [address]  [place], [place], [address] Phone: [phone]   Fax:  [phone]

## 2015-08-27 ENCOUNTER — Ambulatory Visit: Payer: Medicare Other | Admitting: Rehabilitation

## 2015-08-27 DIAGNOSIS — R269 Unspecified abnormalities of gait and mobility: Secondary | ICD-10-CM | POA: Diagnosis not present

## 2015-08-27 DIAGNOSIS — M4806 Spinal stenosis, lumbar region: Secondary | ICD-10-CM | POA: Diagnosis not present

## 2015-08-27 DIAGNOSIS — R262 Difficulty in walking, not elsewhere classified: Secondary | ICD-10-CM | POA: Diagnosis not present

## 2015-08-27 DIAGNOSIS — R279 Unspecified lack of coordination: Secondary | ICD-10-CM

## 2015-08-27 DIAGNOSIS — R2681 Unsteadiness on feet: Secondary | ICD-10-CM | POA: Diagnosis not present

## 2015-08-27 NOTE — Therapy (Signed)
[facility] [address]  [place], [place], [address] Phone: [phone]   Fax:  [phone]  Physical Therapy Treatment  Patient Details  Name: [name] MRN: [id number] Date of Birth: 08/01/1927 Referring Provider:  Jessy Oto, MD  Encounter Date: 08/27/2015      PT End of Session - 08/27/15 1302    Visit Number 15   Number of Visits 20   Date for PT Re-Evaluation 09/12/15   PT Start Time 1302   PT Stop Time 1344   PT Time Calculation (min) 42 min      Past Medical History  Diagnosis Date  . Abdominal aortic aneurysm 09/2008    s/o endovascular repair and angiogram 10/04/08   . Bleeding ulcer 1997  . Leukopenia 2/11    thought to be from doxy  . Renal cell carcinoma     clear cell type dx 10/09, s/p excision 10-24-08  . Hypertension   . Renal insufficiency   . COPD (chronic obstructive pulmonary disease)   . Anemia     EGD Cscope 08-2012  . CKD (chronic kidney disease), stage IV     Stable, Dr. Dimas Aguas  . Solitary left kidney     W/ chronic tubule-interstitial damage, Dr. Dimas Aguas  . Spinal stenosis     causing LE weakness-persistant, being worked up by Hydrographic surveyor and neurologist  . H/O hyperkalemia     Stable    Past Surgical History  Procedure Laterality Date  . Mohs surgery      1997 (lip), 2004  . Hemorrhoid surgery  1990  . Nephrectomy  2009    Open nephrectomy for cancer  . Abdominal aortic aneurysm repair  2009    EVAR    There were no vitals filed for this visit.  Visit Diagnosis:  Unsteadiness on feet  Lack of coordination  Abnormality of gait  Difficulty walking      Subjective Assessment - 08/27/15 1304    Subjective Reports he feels clumsy today.    Currently in Pain? No/denies      TODAY'S TREATMENT:  Ther Ex: Treadmill - 1.5 mph x 6 min (no incline) Slow sit to stand with eccentric control and no HHA x10 with Blue foam under  feet  Neuro: Narrow BOS Rhomberg Standing on Blue Foam 2x30", eyes closed 2x 20" (SBA) Semi-tandem BOS Rhomberg Standing on Blue Foam x30", then with head turns (side/side, up/down) x6 each (CGA) 8" Step ups from Blue Foam x10 with 2# ankle cuff wts, 2 HHA on poles  Lateral Step ups from Blue Foam to 8" Step x10 each side with 2# ankle cuff weights, 2 HHA on poles Side-Stepping on Blue Balance Beam x5 laps (down and back =1) with minimal HHA on chair Tandem gait on Blue Balance Beam x6 with single HHA on chair Side-stepping with forward momentum (diagonal step) over Blue Balance Beam x10 bilaterally SLS on Blue Oval toe touches forward and backward with contra LE x10 bilaterally, 1 HHA on pole Narrow BOS standing on Blue foam while tossing beach ball with no HHA to back of chair x2'        PT Long Term Goals - 08/19/15 1316    PT LONG TERM GOAL #1   Title Pt will improve 45min walk time to being able to walk the complete 6 minutes with at least 900 feet completed (08/14/15)   Status Achieved   PT LONG TERM GOAL #2  Title pt will demonstrate safe RW use in clinic to improve community ambulation endurance   Status Deferred   PT LONG TERM GOAL #3   Title pt will improve bilateral LE strength to 4/5 or greater (09/12/15)   Status Revised   PT LONG TERM GOAL #4   Title pt will return to walking at least 1.5 miles at the gym (09/12/15)   Status On-going   PT LONG TERM GOAL #5   Title Patient will demonstrate improvement on the Berg Balance Scale to 49/56 or greater to reduce risk for falls (09/12/15)   Status On-going               Plan - 08/27/15 1345    Clinical Impression Statement Pt performed well with narrow standing ball toss on blue foam without LOB or needing HHA on chair. He is still unsteady with head turn exercises and tandem exercises.    PT Next Visit Plan LE strengthening, balance/coordination training, walking/activity tolerance   Consulted and Agree with Plan of  Care Patient        Problem List Patient Active Problem List   Diagnosis Date Noted  . Neuropathy 06/10/2015  . Pedal edema 01/30/2015  . Hip pain, chronic 01/30/2015  . Back pain 01/30/2015  . Cramps of lower extremity 01/30/2015  . Aftercare following surgery of the circulatory system, [place] 08/05/2014  . COPD exacerbation 01/10/2014  . Annual physical exam 10/19/2011  . DJD (degenerative joint disease) 12/14/2010  . Anemia 06/06/2009  . COPD (chronic obstructive pulmonary disease) 06/06/2009  . RENAL CELL CANCER 12/25/2008  . HTN (hypertension) 12/25/2008  . AAA (abdominal aortic aneurysm) without rupture 12/25/2008  . CKD (chronic kidney disease) 12/25/2008  . HYPERCHOLESTEROLEMIA 03/01/2008  . GERD 03/01/2008  . ACNE ROSACEA 11/29/2007  . DIVERTICULOSIS, COLON 04/12/2007    Barbette Hair, PTA 08/27/2015, 1:49 PM  [facility] [address]  [place], [place], [address] Phone: [phone]   Fax:  [phone]

## 2015-08-28 DIAGNOSIS — M4806 Spinal stenosis, lumbar region: Secondary | ICD-10-CM | POA: Diagnosis not present

## 2015-08-28 DIAGNOSIS — M810 Age-related osteoporosis without current pathological fracture: Secondary | ICD-10-CM | POA: Diagnosis not present

## 2015-08-31 ENCOUNTER — Other Ambulatory Visit: Payer: Self-pay | Admitting: Internal Medicine

## 2015-09-02 ENCOUNTER — Encounter: Payer: Self-pay | Admitting: Internal Medicine

## 2015-09-02 ENCOUNTER — Ambulatory Visit: Payer: Medicare Other | Attending: Specialist | Admitting: Physical Therapy

## 2015-09-02 ENCOUNTER — Ambulatory Visit (INDEPENDENT_AMBULATORY_CARE_PROVIDER_SITE_OTHER): Payer: Medicare Other | Admitting: Internal Medicine

## 2015-09-02 VITALS — BP 126/64 | HR 82 | Temp 98.4°F | Ht 68.0 in | Wt 175.2 lb

## 2015-09-02 DIAGNOSIS — G629 Polyneuropathy, unspecified: Secondary | ICD-10-CM | POA: Diagnosis not present

## 2015-09-02 DIAGNOSIS — R2681 Unsteadiness on feet: Secondary | ICD-10-CM | POA: Diagnosis not present

## 2015-09-02 DIAGNOSIS — R262 Difficulty in walking, not elsewhere classified: Secondary | ICD-10-CM

## 2015-09-02 DIAGNOSIS — N508 Other specified disorders of male genital organs: Secondary | ICD-10-CM | POA: Diagnosis not present

## 2015-09-02 DIAGNOSIS — M4806 Spinal stenosis, lumbar region: Secondary | ICD-10-CM | POA: Insufficient documentation

## 2015-09-02 DIAGNOSIS — M8580 Other specified disorders of bone density and structure, unspecified site: Secondary | ICD-10-CM | POA: Diagnosis not present

## 2015-09-02 DIAGNOSIS — R269 Unspecified abnormalities of gait and mobility: Secondary | ICD-10-CM | POA: Diagnosis not present

## 2015-09-02 DIAGNOSIS — R279 Unspecified lack of coordination: Secondary | ICD-10-CM | POA: Insufficient documentation

## 2015-09-02 DIAGNOSIS — Z09 Encounter for follow-up examination after completed treatment for conditions other than malignant neoplasm: Secondary | ICD-10-CM

## 2015-09-02 DIAGNOSIS — N5089 Other specified disorders of the male genital organs: Secondary | ICD-10-CM

## 2015-09-02 NOTE — Progress Notes (Signed)
Pre visit review using our clinic review tool, if applicable. No additional management support is needed unless otherwise documented below in the visit note. 

## 2015-09-02 NOTE — Telephone Encounter (Signed)
Pt is requesting refill on Gabapentin. 90 day supply. Last OV: today (09/02/2015). Okay to refill?

## 2015-09-02 NOTE — Patient Instructions (Signed)
Get your blood work before you leave     Try CAPSAICIN daily   Next visit  for a    Physical exam by October 2016   Please schedule an appointment at the front desk Please come back fasting

## 2015-09-02 NOTE — Progress Notes (Signed)
Subjective:    Patient ID: [name], male    DOB: January 17, 1927, 81 y.o.   MRN: [id number]  DOS:  09/02/2015 Type of visit - description : Acute Interval history: Neuropathy: Saw neurology 07/21/2015, further blood work was done, including zinc, SPEP, TSH and CKs: All normal. Currently on gabapentin 500 mg daily, it has decrease his symptoms mildly to moderately. He has noted some somnolence. History of AAA, saw vascular surgery 08/18/2015, note reviewed, felt to be stable. The patient saw neuro surgery, he recommended to check a testosterone level, not clear why, probably because he has osteopenia. Checking testosterone is actually their chief complaint Had a bone density test 07-2015, showed osteopenia. Recommend calcium, vitamin D and stay active.   Review of Systems In general feels well. Good compliance of medication When asked about energy, he reports he feels some fatigue however when he compares with other people his age he is doing okay. Reports no libido for a while.   Past Medical History  Diagnosis Date  . Abdominal aortic aneurysm 09/2008    s/o endovascular repair and angiogram 10/04/08   . Bleeding ulcer 1997  . Leukopenia 2/11    thought to be from doxy  . Renal cell carcinoma     clear cell type dx 10/09, s/p excision 10-24-08  . Hypertension   . Renal insufficiency   . COPD (chronic obstructive pulmonary disease)   . Anemia     EGD Cscope 08-2012  . CKD (chronic kidney disease), stage IV     Stable, Dr. Dimas Aguas  . Solitary left kidney     W/ chronic tubule-interstitial damage, Dr. Dimas Aguas  . Spinal stenosis     causing LE weakness-persistant, being worked up by Hydrographic surveyor and neurologist  . H/O hyperkalemia     Stable    Past Surgical History  Procedure Laterality Date  . Mohs surgery      1997 (lip), 2004  . Hemorrhoid surgery  1990  . Nephrectomy  2009    Open nephrectomy for cancer  . Abdominal aortic aneurysm repair  2009    EVAR     Social History   Social History  . Marital Status: Married    Spouse Name: N/A  . Number of Children: 3  . Years of Education: N/A   Occupational History  . retired    Social History Main Topics  . Smoking status: Former Smoker    Quit date: 12/28/2007  . Smokeless tobacco: Never Used     Comment: used to smoke 1.5 ppd  . Alcohol Use: No  . Drug Use: No  . Sexual Activity: Not on file   Other Topics Concern  . Not on file   Social History Narrative   Lives w/ wife who has dementia    Totally independent on ADL    Often came to office w/ his daughter July [phone]), his other daughter is  [name] , son is Dai                           Medication List       This list is accurate as of: 09/02/15 12:53 PM.  Always use your most recent med list.               Casanthranol-Docusate Sodium 30-100 MG Caps  Take by mouth.     chlorthalidone 25 MG tablet  Commonly known as:  HYGROTON  Take 1 tablet (25  mg total) by mouth daily.     ergocalciferol 50000 UNITS capsule  Commonly known as:  VITAMIN D2  Take 1 capsule (50,000 Units total) by mouth once a week.     gabapentin 100 MG capsule  Commonly known as:  NEURONTIN  Take 200 mg by mouth at bedtime.     gabapentin 300 MG capsule  Commonly known as:  NEURONTIN  Take 300 mg by mouth at bedtime.     gabapentin 100 MG capsule  Commonly known as:  NEURONTIN  Take 1 capsule (100 mg total) by mouth 2 (two) times daily.     hydrALAZINE 25 MG tablet  Commonly known as:  APRESOLINE  Take 1 tablet (25 mg total) by mouth 2 (two) times daily.     labetalol 100 MG tablet  Commonly known as:  NORMODYNE  Take 1 tablet (100 mg total) by mouth 2 (two) times daily.     mometasone 50 MCG/ACT nasal spray  Commonly known as:  NASONEX  2 sprays each nares q day     MULTIVITAMIN/IRON PO  Take by mouth.     SE-TAN PLUS 162-115.2-1 MG Caps  Take 1 tablet by mouth daily. Take 1 Tab  daily           Objective:    Physical Exam BP 126/64 mmHg  Pulse 82  Temp(Src) 98.4 F (36.9 C) (Oral)  Ht 5\' 8"  (1.727 m)  Wt 175 lb 4 oz (79.493 kg)  BMI 26.65 kg/m2  SpO2 94% General:   Well developed, well nourished . NAD.  HEENT:  Normocephalic . Face symmetric, atraumatic Lungs:  CTA B Normal respiratory effort, no intercostal retractions, no accessory muscle use. Heart: RRR,  no murmur.  No pretibial edema bilaterally  Skin: Not pale. Not jaundice,  + varicose veins lower extremities without phlebitis Neurologic:  alert & oriented X3.  Speech normal, gait appropriate for age and unassisted Psych--  Cognition and judgment appear intact.  Cooperative with normal attention span and concentration.  Behavior appropriate. No anxious or depressed appearing.      Assessment & Plan:   Problems list: Hypertension, COPD Renal: --H/o Renal Cancer  --Solitary left kidney --Chronic renal insufficiency Neuropathy,  Saw neuro 06-2015, idiopathic? DJD Mild spinal stenosis, has seen  Dr Flavia Shipper Osteopenia History of leukopenia ---2011 felt to be from doxycycline AAA s/p EVAR 2009 Acne rosacea   A/P Neuropathy: Sawneurology 06-2015, likely to be idiopathic, on gabapentin 500 mg, modestly better, has some side effect mostly somnolence. Pro-cons of neurontin, other alternatives such as Lyrica and addition of capsaicin discussed. Also encouraged compression stockings use for varicose veins and that may account for some of the LE "heaviness". Osteopenia: Per last bone density test, was recommending calcium and vitamin D. Hypogonadism? Andropause  Patient request to check a testosterone level, I warned the patient that we need to interpretate the results cautiously as normalization of a testosterone   is not always benefitial and has potential for side effects.  Today, I spent more than 30   min with the patient: >50% of the time counseling regards treatment options for neuropathy and caveats of testosterone  checking

## 2015-09-02 NOTE — Telephone Encounter (Signed)
Yes, he takes: gabapentin 300 mg one tablet daily and gabapentin 100 mg 2 tablets daily

## 2015-09-02 NOTE — Assessment & Plan Note (Addendum)
Neuropathy: Sawneurology 06-2015, likely to be idiopathic, on gabapentin 500 mg, modestly better, has some side effect mostly somnolence. Pro-cons of neurontin, other alternatives such as Lyrica and addition of capsaicin discussed. Also encouraged compression stockings use for varicose veins and that may account for some of the LE "heaviness". Osteopenia: Per last bone density test, was recommending calcium and vitamin D. Hypogonadism? Andropause  Patient request to check a testosterone level, I warned the patient that we need to interpretate the results cautiously as normalization of a testosterone   is not always benefitial and has potential for side effects.

## 2015-09-02 NOTE — Telephone Encounter (Signed)
Rx sent 

## 2015-09-02 NOTE — Therapy (Signed)
[facility] [address]  [place], [place], [address] Phone: [phone]   Fax:  [phone]  Physical Therapy Treatment  Patient Details  Name: [name] MRN: [id number] Date of Birth: 1927/05/06 Referring Provider:  Jessy Oto, MD  Encounter Date: 09/02/2015      PT End of Session - 09/02/15 1316    Visit Number 16   Number of Visits 20   Date for PT Re-Evaluation 09/12/15   PT Start Time 10      Past Medical History  Diagnosis Date  . Abdominal aortic aneurysm 09/2008    s/o endovascular repair and angiogram 10/04/08   . Bleeding ulcer 1997  . Leukopenia 2/11    thought to be from doxy  . Renal cell carcinoma     clear cell type dx 10/09, s/p excision 10-24-08  . Hypertension   . Renal insufficiency   . COPD (chronic obstructive pulmonary disease)   . Anemia     EGD Cscope 08-2012  . CKD (chronic kidney disease), stage IV     Stable, Dr. Dimas Aguas  . Solitary left kidney     W/ chronic tubule-interstitial damage, Dr. Dimas Aguas  . Spinal stenosis     causing LE weakness-persistant, being worked up by Hydrographic surveyor and neurologist  . H/O hyperkalemia     Stable    Past Surgical History  Procedure Laterality Date  . Mohs surgery      1997 (lip), 2004  . Hemorrhoid surgery  1990  . Nephrectomy  2009    Open nephrectomy for cancer  . Abdominal aortic aneurysm repair  2009    EVAR    There were no vitals filed for this visit.  Visit Diagnosis:  No diagnosis found.      Subjective Assessment - 09/02/15 1313    Subjective Patient states "I'm moving slow but otherwise good".   Currently in Pain? No/denies            TODAY'S TREATMENT:  Ther Ex: Treadmill - 1.5 mph x 6 min (no incline) Slow sit to stand with eccentric control and no HHA x10 with Blue foam under feet  Neuro: 8" Step ups from Blue Foam x10 with 3# ankle cuff wts, 2 HHA on poles  Lateral Step ups from  Blue Foam to 8" Step x10 each side with 3# ankle cuff weights, 2 HHA on poles Side-Stepping on Blue Balance Beam x5 laps (down and back =1) wearing 3# ankle cuff weights with minimal HHA Tandem gait on Blue Balance Beam x6 with single HHA on chair Tandem stance on Blue Balance Beam x30' with eyes open & single HHA, x10' with eyes closed & single HHA Side-stepping with forward momentum (diagonal step) over Blue Balance Beam x10 bilaterally Narrow BOS Rhomberg Standing on Blue Foam 2x30", eyes closed 2x 20" (SBA), eyes open with upper trunk rotation (side/side) x6 (SBA) Semi-tandem BOS Rhomberg Standing on Blue Foam x30", then with head turns (side/side, up/down) x6 each (CGA) Narrow BOS standing on Blue foam while tossing beach ball with no HHA to back of chair x2'  Semi-tandem stance with forward foot on Blue foam while tossing beach ball with intermittent HHA on back of chair x1' each foot forward SLS on Blue foam Oval toe touches forward and backward with contra LE x10 bilaterally, 1 HHA on pole            PT Long Term Goals - 08/19/15 1316  PT LONG TERM GOAL #1   Title Pt will improve 43min walk time to being able to walk the complete 6 minutes with at least 900 feet completed (08/14/15)   Status Achieved   PT LONG TERM GOAL #2   Title pt will demonstrate safe RW use in clinic to improve community ambulation endurance   Status Deferred   PT LONG TERM GOAL #3   Title pt will improve bilateral LE strength to 4/5 or greater (09/12/15)   Status Revised   PT LONG TERM GOAL #4   Title pt will return to walking at least 1.5 miles at the gym (09/12/15)   Status On-going   PT LONG TERM GOAL #5   Title Patient will demonstrate improvement on the Berg Balance Scale to 49/56 or greater to reduce risk for falls (09/12/15)   Status On-going               Problem List Patient Active Problem List   Diagnosis Date Noted  . Follow-up --- PCP NOTES 09/02/2015  . Neuropathy 06/10/2015   . Pedal edema 01/30/2015  . Hip pain, chronic 01/30/2015  . Back pain 01/30/2015  . Cramps of lower extremity 01/30/2015  . Aftercare following surgery of the circulatory system, [place] 08/05/2014  . COPD exacerbation 01/10/2014  . Annual physical exam 10/19/2011  . DJD (degenerative joint disease) 12/14/2010  . Anemia 06/06/2009  . COPD (chronic obstructive pulmonary disease) 06/06/2009  . RENAL CELL CANCER 12/25/2008  . HTN (hypertension) 12/25/2008  . AAA (abdominal aortic aneurysm) without rupture 12/25/2008  . CKD (chronic kidney disease) 12/25/2008  . HYPERCHOLESTEROLEMIA 03/01/2008  . GERD 03/01/2008  . ACNE ROSACEA 11/29/2007  . DIVERTICULOSIS, COLON 04/12/2007    Percival Spanish, PT, MPT 09/02/2015, 1:16 PM  [facility] [address]  [place], [place], [address] Phone: [phone]   Fax:  [phone]

## 2015-09-03 LAB — TESTOSTERONE, FREE, TOTAL, SHBG
SEX HORMONE BINDING: 64 nmol/L (ref 22–77)
TESTOSTERONE: 284 ng/dL — AB (ref 300–890)
Testosterone, Free: 35.3 pg/mL — ABNORMAL LOW (ref 47.0–244.0)
Testosterone-% Free: 1.2 % — ABNORMAL LOW (ref 1.6–2.9)

## 2015-09-04 ENCOUNTER — Ambulatory Visit: Payer: Medicare Other | Admitting: Rehabilitation

## 2015-09-04 ENCOUNTER — Other Ambulatory Visit: Payer: Self-pay

## 2015-09-04 DIAGNOSIS — R262 Difficulty in walking, not elsewhere classified: Secondary | ICD-10-CM

## 2015-09-04 DIAGNOSIS — R279 Unspecified lack of coordination: Secondary | ICD-10-CM | POA: Diagnosis not present

## 2015-09-04 DIAGNOSIS — M4806 Spinal stenosis, lumbar region: Secondary | ICD-10-CM | POA: Diagnosis not present

## 2015-09-04 DIAGNOSIS — R269 Unspecified abnormalities of gait and mobility: Secondary | ICD-10-CM

## 2015-09-04 DIAGNOSIS — M48061 Spinal stenosis, lumbar region without neurogenic claudication: Secondary | ICD-10-CM

## 2015-09-04 DIAGNOSIS — R2681 Unsteadiness on feet: Secondary | ICD-10-CM | POA: Diagnosis not present

## 2015-09-04 NOTE — Therapy (Signed)
[facility] [address]  [place], [place], [address] Phone: [phone]   Fax:  [phone]  Physical Therapy Treatment  Patient Details  Name: [name] MRN: [id number] Date of Birth: November 20, 1927 Referring Provider:  Jessy Oto, MD  Encounter Date: 09/04/2015      PT End of Session - 09/04/15 1443    Visit Number 17   Number of Visits 20   Date for PT Re-Evaluation 09/12/15   PT Start Time 3837   PT Stop Time 1524   PT Time Calculation (min) 41 min      Past Medical History  Diagnosis Date  . Abdominal aortic aneurysm 09/2008    s/o endovascular repair and angiogram 10/04/08   . Bleeding ulcer 1997  . Leukopenia 2/11    thought to be from doxy  . Renal cell carcinoma     clear cell type dx 10/09, s/p excision 10-24-08  . Hypertension   . Renal insufficiency   . COPD (chronic obstructive pulmonary disease)   . Anemia     EGD Cscope 08-2012  . CKD (chronic kidney disease), stage IV     Stable, Dr. Dimas Aguas  . Solitary left kidney     W/ chronic tubule-interstitial damage, Dr. Dimas Aguas  . Spinal stenosis     causing LE weakness-persistant, being worked up by Hydrographic surveyor and neurologist  . H/O hyperkalemia     Stable    Past Surgical History  Procedure Laterality Date  . Mohs surgery      1997 (lip), 2004  . Hemorrhoid surgery  1990  . Nephrectomy  2009    Open nephrectomy for cancer  . Abdominal aortic aneurysm repair  2009    EVAR    There were no vitals filed for this visit.  Visit Diagnosis:  Unsteadiness on feet  Lack of coordination  Abnormality of gait  Difficulty walking  Spinal stenosis of lumbar region      Subjective Assessment - 09/04/15 1445    Subjective "Feeling ok."   Currently in Pain? No/denies          TODAY'S TREATMENT:  Ther Ex: Treadmill - 1.5 mph x 6 min (no incline)  Neuro: Slow March on Rebounder x10 with 1 HHA then x10 with no  HHA Heel/Toe Raises on Rebounder x10 with 2 HHA Side-Stepping on Blue Balance Beam x5 laps (down and back =1) with PRN HHA on chair Fwd/Bwd Stepping over Blue Balance Beam x10 bilaterally Narrow Standing on Blue Balance Beam with Head Turns (up/down, side/side) x10 each Tandem gait on Blue Balance Beam x6 with single HHA on chair Bwd Tandem gait on Floor x5 with single HHA on chair (blue balance beam was too hard so switched to floor) Narrow BOS standing on Blue foam while tossing beach ball with no HHA to back of chair x2' with PTA moving side to side Semi-tandem stance with forward foot on Blue foam while tossing beach ball with intermittent HHA on back of chair x1' each foot forward Toe Taps to BOSU (down) from floor with 1 HHA on chair x10, then x10 with no HHA Step up onto BOSU (down) 5"x3 (very difficult) with HHA on treadmill       PT Long Term Goals - 09/02/15 1539    PT LONG TERM GOAL #3   Title pt will improve bilateral LE strength to 4/5 or greater (09/12/15)   Status On-going   PT LONG TERM GOAL #4  Title pt will return to walking at least 1.5 miles at the gym (09/12/15)   Status On-going   PT LONG TERM GOAL #5   Title Patient will demonstrate improvement on the Berg Balance Scale to 49/56 or greater to reduce risk for falls (09/12/15)   Status On-going               Plan - 09/04/15 1524    Clinical Impression Statement Pt very unsteady with BOSU step ups but able to tolerate other exercises well. Still able to tolerate decreased HHA well and able to perform slow march on rebounder without HHA.    PT Next Visit Plan LE strengthening, balance/coordination training, walking/activity tolerance   Consulted and Agree with Plan of Care Patient        Problem List Patient Active Problem List   Diagnosis Date Noted  . Follow-up --- PCP NOTES 09/02/2015  . Neuropathy 06/10/2015  . Pedal edema 01/30/2015  . Hip pain, chronic 01/30/2015  . Back pain 01/30/2015  .  Cramps of lower extremity 01/30/2015  . Aftercare following surgery of the circulatory system, [place] 08/05/2014  . COPD exacerbation 01/10/2014  . Annual physical exam 10/19/2011  . DJD (degenerative joint disease) 12/14/2010  . Anemia 06/06/2009  . COPD (chronic obstructive pulmonary disease) 06/06/2009  . RENAL CELL CANCER 12/25/2008  . HTN (hypertension) 12/25/2008  . AAA (abdominal aortic aneurysm) without rupture 12/25/2008  . CKD (chronic kidney disease) 12/25/2008  . HYPERCHOLESTEROLEMIA 03/01/2008  . GERD 03/01/2008  . ACNE ROSACEA 11/29/2007  . DIVERTICULOSIS, COLON 04/12/2007    Barbette Hair, PTA 09/04/2015, 3:26 PM  [facility] [address]  [place], [place], [address] Phone: [phone]   Fax:  [phone]

## 2015-09-09 ENCOUNTER — Ambulatory Visit: Payer: Medicare Other | Admitting: Rehabilitation

## 2015-09-10 ENCOUNTER — Ambulatory Visit: Payer: Medicare Other | Admitting: Rehabilitation

## 2015-09-10 DIAGNOSIS — M48061 Spinal stenosis, lumbar region without neurogenic claudication: Secondary | ICD-10-CM

## 2015-09-10 DIAGNOSIS — M4806 Spinal stenosis, lumbar region: Secondary | ICD-10-CM | POA: Diagnosis not present

## 2015-09-10 DIAGNOSIS — R262 Difficulty in walking, not elsewhere classified: Secondary | ICD-10-CM | POA: Diagnosis not present

## 2015-09-10 DIAGNOSIS — R279 Unspecified lack of coordination: Secondary | ICD-10-CM | POA: Diagnosis not present

## 2015-09-10 DIAGNOSIS — R269 Unspecified abnormalities of gait and mobility: Secondary | ICD-10-CM | POA: Diagnosis not present

## 2015-09-10 DIAGNOSIS — R2681 Unsteadiness on feet: Secondary | ICD-10-CM

## 2015-09-10 NOTE — Therapy (Signed)
[facility] [address]  [place], [place], [address] Phone: [phone]   Fax:  [phone]  Physical Therapy Treatment  Patient Details  Name: [name] MRN: [id number] Date of Birth: December 14, 1927 Referring Provider:  Jessy Oto, MD  Encounter Date: 09/10/2015      PT End of Session - 09/10/15 1701    Visit Number 18   Number of Visits 20   Date for PT Re-Evaluation 09/12/15   PT Start Time 9518   PT Stop Time 1655   PT Time Calculation (min) 40 min   Activity Tolerance Patient tolerated treatment well      Past Medical History  Diagnosis Date  . Abdominal aortic aneurysm 09/2008    s/o endovascular repair and angiogram 10/04/08   . Bleeding ulcer 1997  . Leukopenia 2/11    thought to be from doxy  . Renal cell carcinoma     clear cell type dx 10/09, s/p excision 10-24-08  . Hypertension   . Renal insufficiency   . COPD (chronic obstructive pulmonary disease)   . Anemia     EGD Cscope 08-2012  . CKD (chronic kidney disease), stage IV     Stable, Dr. Dimas Aguas  . Solitary left kidney     W/ chronic tubule-interstitial damage, Dr. Dimas Aguas  . Spinal stenosis     causing LE weakness-persistant, being worked up by Hydrographic surveyor and neurologist  . H/O hyperkalemia     Stable    Past Surgical History  Procedure Laterality Date  . Mohs surgery      1997 (lip), 2004  . Hemorrhoid surgery  1990  . Nephrectomy  2009    Open nephrectomy for cancer  . Abdominal aortic aneurysm repair  2009    EVAR    There were no vitals filed for this visit.  Visit Diagnosis:  Unsteadiness on feet  Abnormality of gait  Spinal stenosis of lumbar region      Subjective Assessment - 09/10/15 1615    Subjective no c/o   Currently in Pain? No/denies          Ther Ex: Treadmill - 1.5 mph x 6 min (no incline)  Neuro: Slow March on Rebounder x15bil SBA/CGA Heel/Toe Raises on Rebounder x15 with  2 HHA Side-Stepping on [employer] Beam x5 laps (down and back =1) with PRN HHA on counter Upside down balance beam feet together stance without hold 3x20" CGA Fwd/Bwd Stepping over Blue Balance Beam x10 bilaterally CGA Narrow Standing on Blue Balance Beam upside down with Head Turns (up/down, side/side) x10 each Tandem gait on Blue Balance Beam x6 with single HHA on balance beam Bwd Tandem gait on Floor x5 with single CGA.minA Toe Taps to BOSU (down) from floor with CGA x 10 bil Step up onto BOSU (down) at sink with one hand hold x 6bil also CGA SL stance work 3x20" bil SBA                             PT Long Term Goals - 09/02/15 1539    PT LONG TERM GOAL #3   Title pt will improve bilateral LE strength to 4/5 or greater (09/12/15)   Status On-going   PT LONG TERM GOAL #4   Title pt will return to walking at least 1.5 miles at the gym (09/12/15)   Status On-going   PT LONG TERM GOAL #5  Title Patient will demonstrate improvement on the Berg Balance Scale to 49/56 or greater to reduce risk for falls (09/12/15)   Status On-going               Plan - 09/10/15 1704    Clinical Impression Statement tolerates all well still with difficulties regarding single leg balance and unstable surfaces   PT Next Visit Plan LE strengthening, balance/coordination training, walking/activity tolerance        Problem List Patient Active Problem List   Diagnosis Date Noted  . Follow-up --- PCP NOTES 09/02/2015  . Neuropathy 06/10/2015  . Pedal edema 01/30/2015  . Hip pain, chronic 01/30/2015  . Back pain 01/30/2015  . Cramps of lower extremity 01/30/2015  . Aftercare following surgery of the circulatory system, [place] 08/05/2014  . COPD exacerbation 01/10/2014  . Annual physical exam 10/19/2011  . DJD (degenerative joint disease) 12/14/2010  . Anemia 06/06/2009  . COPD (chronic obstructive pulmonary disease) 06/06/2009  . RENAL CELL CANCER 12/25/2008  . HTN  (hypertension) 12/25/2008  . AAA (abdominal aortic aneurysm) without rupture 12/25/2008  . CKD (chronic kidney disease) 12/25/2008  . HYPERCHOLESTEROLEMIA 03/01/2008  . GERD 03/01/2008  . ACNE ROSACEA 11/29/2007  . DIVERTICULOSIS, COLON 04/12/2007    Stark Bray, DPT, CMP 09/10/2015, 5:07 PM  [facility] [address]  [address] [place], [place], [address] Phone: [phone]   Fax:  [phone]

## 2015-09-11 ENCOUNTER — Ambulatory Visit: Payer: Medicare Other | Admitting: Physical Therapy

## 2015-09-11 DIAGNOSIS — R262 Difficulty in walking, not elsewhere classified: Secondary | ICD-10-CM

## 2015-09-11 DIAGNOSIS — R279 Unspecified lack of coordination: Secondary | ICD-10-CM

## 2015-09-11 DIAGNOSIS — R2681 Unsteadiness on feet: Secondary | ICD-10-CM | POA: Diagnosis not present

## 2015-09-11 DIAGNOSIS — M4806 Spinal stenosis, lumbar region: Secondary | ICD-10-CM | POA: Diagnosis not present

## 2015-09-11 DIAGNOSIS — R269 Unspecified abnormalities of gait and mobility: Secondary | ICD-10-CM

## 2015-09-11 NOTE — Therapy (Signed)
[facility] [address]  [place], [place], [address] Phone: [phone]   Fax:  [phone]  Physical Therapy Treatment  Patient Details  Name: [name] MRN: [id number] Date of Birth: 1927/06/16 Referring Provider:  Jessy Oto, MD  Encounter Date: 09/11/2015      PT End of Session - 09/11/15 1314    Visit Number 19   Number of Visits 20   PT Start Time 1311   PT Stop Time 4383   PT Time Calculation (min) 47 min   Activity Tolerance Patient tolerated treatment well   Behavior During Therapy [facility] for tasks assessed/performed      Past Medical History  Diagnosis Date  . Abdominal aortic aneurysm 09/2008    s/o endovascular repair and angiogram 10/04/08   . Bleeding ulcer 1997  . Leukopenia 2/11    thought to be from doxy  . Renal cell carcinoma     clear cell type dx 10/09, s/p excision 10-24-08  . Hypertension   . Renal insufficiency   . COPD (chronic obstructive pulmonary disease)   . Anemia     EGD Cscope 08-2012  . CKD (chronic kidney disease), stage IV     Stable, Dr. Dimas Aguas  . Solitary left kidney     W/ chronic tubule-interstitial damage, Dr. Dimas Aguas  . Spinal stenosis     causing LE weakness-persistant, being worked up by Hydrographic surveyor and neurologist  . H/O hyperkalemia     Stable    Past Surgical History  Procedure Laterality Date  . Mohs surgery      1997 (lip), 2004  . Hemorrhoid surgery  1990  . Nephrectomy  2009    Open nephrectomy for cancer  . Abdominal aortic aneurysm repair  2009    EVAR    There were no vitals filed for this visit.  Visit Diagnosis:  Unsteadiness on feet  Abnormality of gait  Lack of coordination  Difficulty walking      Subjective Assessment - 09/11/15 1522    Subjective Patient's daughter inquiring about activities/exercises for patient to continue with at home.   Patient is accompained by: Family member   Currently in Pain?  No/denies            [facility] PT Assessment - 09/11/15 1311    Observation/Other Assessments   Focus on Therapeutic Outcomes (FOTO)  44% (56% limitation)   ROM / Strength   AROM / PROM / Strength Strength   Strength   Strength Assessment Site Hip   Right/Left Hip Right;Left   Right Hip Flexion 4/5   Right Hip Extension 4/5   Right Hip ABduction 4+/5   Right Hip ADduction 4/5   Left Hip Flexion 4+/5   Left Hip Extension 4/5   Left Hip ABduction 4+/5   Left Hip ADduction 4/5   Right/Left Knee Right;Left   Right Knee Flexion 4/5   Right Knee Extension 4+/5   Left Knee Flexion 4/5   Left Knee Extension 4+/5   Standardized Balance Assessment   Standardized Balance Assessment Berg Balance Test   Berg Balance Test   Sit to Stand Able to stand without using hands and stabilize independently   Standing Unsupported Able to stand safely 2 minutes   Sitting with Back Unsupported but Feet Supported on Floor or Stool Able to sit safely and securely 2 minutes   Stand to Sit Sits safely with minimal use of hands   Transfers Able to  transfer safely, minor use of hands   Standing Unsupported with Eyes Closed Able to stand 10 seconds safely   Standing Ubsupported with Feet Together Able to place feet together independently and stand 1 minute safely   From Standing, Reach Forward with Outstretched Arm Can reach confidently >25 cm (10")   From Standing Position, Pick up Object from Floor Able to pick up shoe safely and easily   From Standing Position, Turn to Look Behind Over each Shoulder Looks behind one side only/other side shows less weight shift   Turn 360 Degrees Able to turn 360 degrees safely one side only in 4 seconds or less   Standing Unsupported, Alternately Place Feet on Step/Stool Able to stand independently and safely and complete 8 steps in 20 seconds   Standing Unsupported, One Foot in Front Able to plae foot ahead of the other independently and hold 30 seconds   Standing on One  Leg Able to lift leg independently and hold equal to or more than 3 seconds   Total Score 51           TODAY'S TREATMENT:  Ther Ex: Treadmill - 1.5 mph x 6 min (no incline)  Neuro: Berg Balance Scale Narrow BOS Rhomberg Standing on Blue Foam 2x30", eyes closed 2x 20" (SBA), eyes open with upper trunk rotation (side/side) x6 (SBA) Semi-tandem BOS Rhomberg Standing on Blue Foam x30", then with head turns (side/side, up/down) x6 each (CGA) 8" Step ups from Blue Foam x10 with 3# ankle cuff wts, 2 HHA on poles   HEP review with patient and daughter           PT Education - 09/11/15 1516    Education provided Yes   Education Details Balance HEP   Person(s) Educated Patient;Child(ren)  Daughter   Methods Explanation;Demonstration;Handout   Comprehension Verbalized understanding;Returned demonstration             PT Long Term Goals - 09/11/15 1510    PT LONG TERM GOAL #1   Title Pt will improve 50min walk time to being able to walk the complete 6 minutes with at least 900 feet completed (08/14/15)   Status Achieved   PT LONG TERM GOAL #2   Title pt will demonstrate safe RW use in clinic to improve community ambulation endurance   Status Deferred   PT LONG TERM GOAL #3   Title pt will improve bilateral LE strength to 4/5 or greater (09/12/15)   Status Achieved   PT LONG TERM GOAL #4   Title pt will return to walking at least 1.5 miles at the gym (09/12/15)   Status Not Met  Not attempted per patient's daughter   PT [place] #5   Title Patient will demonstrate improvement on the Berg Balance Scale to 49/56 or greater to reduce risk for falls (09/12/15)   Status Achieved               Plan - 09/11/15 1520    Clinical Impression Statement Patient has demonstrated improvement in balance and coordiation over recent visits with Berg increased to 51/56. LE strength also improved with overall LE strength 4/5 or greater. Today's treatment focused on  transition of relevant balance exercises and activities to HEP with handout provided to guide patient and daughter in progression of activities. Discussed patient returning to gym to use treadmill and/or exercise bike along with aquatic activities to promote increased overall energy and activity tolerance. Patient and daughter acknowledge understanding of recommendend activities and  able to demonstrate all HEP activities appropriately.   PT Next Visit Plan N/A   Consulted and Agree with Plan of Care Patient;Family member/caregiver   Family Member Consulted Daughter          G-Codes - 2015/10/01 1512    Functional Assessment Tool Used Neuromuscular disorder FOTO = 44% (56% limitation)   Functional Limitation Mobility: Walking and moving around   Mobility: Walking and Moving Around Current Status [phone]) At least 40 percent but less than 60 percent impaired, limited or restricted   Mobility: Walking and Moving Around Goal Status [phone]) At least 40 percent but less than 60 percent impaired, limited or restricted   Mobility: Walking and Moving Around Discharge Status [phone]) At least 40 percent but less than 60 percent impaired, limited or restricted      Problem List Patient Active Problem List   Diagnosis Date Noted  . Follow-up --- PCP NOTES 09/02/2015  . Neuropathy 06/10/2015  . Pedal edema 01/30/2015  . Hip pain, chronic 01/30/2015  . Back pain 01/30/2015  . Cramps of lower extremity 01/30/2015  . Aftercare following surgery of the circulatory system, [place] 08/05/2014  . COPD exacerbation 01/10/2014  . Annual physical exam 10/19/2011  . DJD (degenerative joint disease) 12/14/2010  . Anemia 06/06/2009  . COPD (chronic obstructive pulmonary disease) 06/06/2009  . RENAL CELL CANCER 12/25/2008  . HTN (hypertension) 12/25/2008  . AAA (abdominal aortic aneurysm) without rupture 12/25/2008  . CKD (chronic kidney disease) 12/25/2008  . HYPERCHOLESTEROLEMIA 03/01/2008  . GERD 03/01/2008   . ACNE ROSACEA 11/29/2007  . DIVERTICULOSIS, COLON 04/12/2007    Percival Spanish Oct 01, 2015, 5:44 PM  [facility] [address]  [place], [place], [address] Phone: [phone]   Fax:  [phone]    PHYSICAL THERAPY DISCHARGE SUMMARY  Visits from Start of Care: 19  Current functional level related to goals / functional outcomes:  Patient has demonstrated improvement in balance and coordiation over recent visits with Berg increased to 51/56. LE strength also improved with overall LE strength 4/5 or greater. All goals met except use of RW for ambulation (deferred per patient and daughter's preference) and walking at least 1.5 miles at gym (not yet attempted but discussed patient returning to gym to use treadmill and/or exercise bike along with aquatic activities to promote increased overall energy and activity tolerance). Training completed with patient and daughter in balance HEP activities along with progression of activities and appropriate safety measures to reduce fall risk while attempting activities. Patient and daughter acknowledge understanding of recommendend activities and able to demonstrate all HEP activities appropriately, therefore will proceed with discharge from OP PT at this time.   Remaining deficits:  Continued c/o decreased energy   Education / Equipment:  HEP  Plan: Patient agrees to discharge.  Patient goals were met. Patient is being discharged due to being pleased with the current functional level.  ?????       Percival Spanish, PT, MPT 10-01-15, 5:52 PM  [facility] [address]  [place], [place], [address] Phone: [phone]   Fax:  [phone]

## 2015-09-22 DIAGNOSIS — E559 Vitamin D deficiency, unspecified: Secondary | ICD-10-CM | POA: Diagnosis not present

## 2015-09-22 DIAGNOSIS — N183 Chronic kidney disease, stage 3 (moderate): Secondary | ICD-10-CM | POA: Diagnosis not present

## 2015-09-22 DIAGNOSIS — I10 Essential (primary) hypertension: Secondary | ICD-10-CM | POA: Diagnosis not present

## 2015-09-24 NOTE — Addendum Note (Signed)
Addended by: Dorthula Rue L on: 09/24/2015 11:26 AM   Modules accepted: Orders

## 2015-09-24 NOTE — Addendum Note (Signed)
Addended by: Dorthula Rue L on: 09/24/2015 03:34 PM   Modules accepted: Orders

## 2015-09-25 ENCOUNTER — Other Ambulatory Visit: Payer: Self-pay

## 2015-09-25 DIAGNOSIS — N183 Chronic kidney disease, stage 3 (moderate): Secondary | ICD-10-CM | POA: Diagnosis not present

## 2015-09-25 DIAGNOSIS — I714 Abdominal aortic aneurysm, without rupture, unspecified: Secondary | ICD-10-CM

## 2015-09-25 DIAGNOSIS — I10 Essential (primary) hypertension: Secondary | ICD-10-CM | POA: Diagnosis not present

## 2015-09-25 DIAGNOSIS — N390 Urinary tract infection, site not specified: Secondary | ICD-10-CM | POA: Diagnosis not present

## 2015-09-25 LAB — BASIC METABOLIC PANEL
BUN: 29 mg/dL — AB (ref 4–21)
CREATININE: 2.3 mg/dL — AB (ref 0.6–1.3)
Glucose: 85 mg/dL
POTASSIUM: 5 mmol/L (ref 3.4–5.3)
Sodium: 140 mmol/L (ref 137–147)

## 2015-09-25 LAB — CBC AND DIFFERENTIAL
HCT: 35 % — AB (ref 41–53)
Hemoglobin: 11.4 g/dL — AB (ref 13.5–17.5)
Platelets: 100 10*3/uL — AB (ref 150–399)
WBC: 3.4 10^3/mL

## 2015-09-29 DIAGNOSIS — M810 Age-related osteoporosis without current pathological fracture: Secondary | ICD-10-CM | POA: Diagnosis not present

## 2015-09-29 DIAGNOSIS — M4806 Spinal stenosis, lumbar region: Secondary | ICD-10-CM | POA: Diagnosis not present

## 2015-10-06 ENCOUNTER — Encounter: Payer: Self-pay | Admitting: Internal Medicine

## 2015-10-16 DIAGNOSIS — G629 Polyneuropathy, unspecified: Secondary | ICD-10-CM | POA: Diagnosis not present

## 2015-10-16 DIAGNOSIS — G20 Parkinson's disease: Secondary | ICD-10-CM | POA: Diagnosis not present

## 2015-10-20 ENCOUNTER — Telehealth: Payer: Self-pay | Admitting: Behavioral Health

## 2015-10-20 ENCOUNTER — Encounter: Payer: Self-pay | Admitting: Behavioral Health

## 2015-10-20 NOTE — Addendum Note (Signed)
Addended by: Eduard Roux E on: 10/20/2015 11:34 AM   Modules accepted: Orders, Medications

## 2015-10-20 NOTE — Telephone Encounter (Signed)
Unable to reach patient at time of Pre-Visit Call.  Left message for patient to return call when available.    

## 2015-10-20 NOTE — Telephone Encounter (Signed)
Pre-Visit Call completed with the patient's daughter and chart updated.   Pre-Visit Info documented in Specialty Comments under [employer].

## 2015-10-21 ENCOUNTER — Encounter: Payer: Self-pay | Admitting: Internal Medicine

## 2015-10-21 ENCOUNTER — Ambulatory Visit (INDEPENDENT_AMBULATORY_CARE_PROVIDER_SITE_OTHER): Payer: Medicare Other | Admitting: Internal Medicine

## 2015-10-21 VITALS — BP 118/62 | HR 76 | Temp 98.1°F | Ht 68.0 in | Wt 172.0 lb

## 2015-10-21 DIAGNOSIS — D696 Thrombocytopenia, unspecified: Secondary | ICD-10-CM

## 2015-10-21 DIAGNOSIS — I10 Essential (primary) hypertension: Secondary | ICD-10-CM

## 2015-10-21 DIAGNOSIS — Z09 Encounter for follow-up examination after completed treatment for conditions other than malignant neoplasm: Secondary | ICD-10-CM

## 2015-10-21 DIAGNOSIS — G629 Polyneuropathy, unspecified: Secondary | ICD-10-CM

## 2015-10-21 DIAGNOSIS — Z23 Encounter for immunization: Secondary | ICD-10-CM

## 2015-10-21 DIAGNOSIS — Z0000 Encounter for general adult medical examination without abnormal findings: Secondary | ICD-10-CM | POA: Diagnosis not present

## 2015-10-21 LAB — CBC WITH DIFFERENTIAL/PLATELET
BASOS ABS: 0 10*3/uL (ref 0.0–0.1)
BASOS PCT: 1.1 % (ref 0.0–3.0)
EOS ABS: 0.1 10*3/uL (ref 0.0–0.7)
Eosinophils Relative: 4.2 % (ref 0.0–5.0)
HEMATOCRIT: 34.8 % — AB (ref 39.0–52.0)
Hemoglobin: 11.6 g/dL — ABNORMAL LOW (ref 13.0–17.0)
LYMPHS ABS: 0.9 10*3/uL (ref 0.7–4.0)
LYMPHS PCT: 28.2 % (ref 12.0–46.0)
MCHC: 33.2 g/dL (ref 30.0–36.0)
MCV: 93 fl (ref 78.0–100.0)
Monocytes Absolute: 0.3 10*3/uL (ref 0.1–1.0)
Monocytes Relative: 9.6 % (ref 3.0–12.0)
NEUTROS ABS: 1.8 10*3/uL (ref 1.4–7.7)
NEUTROS PCT: 56.9 % (ref 43.0–77.0)
PLATELETS: 115 10*3/uL — AB (ref 150.0–400.0)
RBC: 3.74 Mil/uL — ABNORMAL LOW (ref 4.22–5.81)
RDW: 14 % (ref 11.5–15.5)
WBC: 3.2 10*3/uL — ABNORMAL LOW (ref 4.0–10.5)

## 2015-10-21 MED ORDER — ZOSTER VACCINE LIVE 19400 UNT/0.65ML ~~LOC~~ SOLR: 0.6500 mL | Freq: Once | SUBCUTANEOUS | Status: DC

## 2015-10-21 NOTE — Patient Instructions (Signed)
Get your blood work before you leave    Next visit in 6-8 months for a regular checkup. No fasting. 30 min    Fall Prevention and Home Safety Falls cause injuries and can affect all age groups. It is possible to use preventive measures to significantly decrease the likelihood of falls. There are many simple measures which can make your home safer and prevent falls. OUTDOORS  Repair cracks and edges of walkways and driveways.  Remove high doorway thresholds.  Trim shrubbery on the main path into your home.  Have good outside lighting.  Clear walkways of tools, rocks, debris, and clutter.  Check that handrails are not broken and are securely fastened. Both sides of steps should have handrails.  Have leaves, snow, and ice cleared regularly.  Use sand or salt on walkways during winter months.  In the garage, clean up grease or oil spills. BATHROOM  Install night lights.  Install grab bars by the toilet and in the tub and shower.  Use non-skid mats or decals in the tub or shower.  Place a plastic non-slip stool in the shower to sit on, if needed.  Keep floors dry and clean up all water on the floor immediately.  Remove soap buildup in the tub or shower on a regular basis.  Secure bath mats with non-slip, double-sided rug tape.  Remove throw rugs and tripping hazards from the floors. BEDROOMS  Install night lights.  Make sure a bedside light is easy to reach.  Do not use oversized bedding.  Keep a telephone by your bedside.  Have a firm chair with side arms to use for getting dressed.  Remove throw rugs and tripping hazards from the floor. KITCHEN  Keep handles on pots and pans turned toward the center of the stove. Use back burners when possible.  Clean up spills quickly and allow time for drying.  Avoid walking on wet floors.  Avoid hot utensils and knives.  Position shelves so they are not too high or low.  Place commonly used objects within easy  reach.  If necessary, use a sturdy step stool with a grab bar when reaching.  Keep electrical cables out of the way.  Do not use floor polish or wax that makes floors slippery. If you must use wax, use non-skid floor wax.  Remove throw rugs and tripping hazards from the floor. STAIRWAYS  Never leave objects on stairs.  Place handrails on both sides of stairways and use them. Fix any loose handrails. Make sure handrails on both sides of the stairways are as long as the stairs.  Check carpeting to make sure it is firmly attached along stairs. Make repairs to worn or loose carpet promptly.  Avoid placing throw rugs at the top or bottom of stairways, or properly secure the rug with carpet tape to prevent slippage. Get rid of throw rugs, if possible.  Have an electrician put in a light switch at the top and bottom of the stairs. OTHER FALL PREVENTION TIPS  Wear low-heel or rubber-soled shoes that are supportive and fit well. Wear closed toe shoes.  When using a stepladder, make sure it is fully opened and both spreaders are firmly locked. Do not climb a closed stepladder.  Add color or contrast paint or tape to grab bars and handrails in your home. Place contrasting color strips on first and last steps.  Learn and use mobility aids as needed. Install an electrical emergency response system.  Turn on lights to avoid dark  areas. Replace light bulbs that burn out immediately. Get light switches that glow.  Arrange furniture to create clear pathways. Keep furniture in the same place.  Firmly attach carpet with non-skid or double-sided tape.  Eliminate uneven floor surfaces.  Select a carpet pattern that does not visually hide the edge of steps.  Be aware of all pets. OTHER HOME SAFETY TIPS  Set the water temperature for 120 F (48.8 C).  Keep emergency numbers on or near the telephone.  Keep smoke detectors on every level of the home and near sleeping areas. Document Released:  12/03/2002 Document Revised: 06/13/2012 Document Reviewed: 03/03/2012 [facility] Patient Information 2015 [place], [place]. This information is not intended to replace advice given to you by your health care provider. Make sure you discuss any questions you have with your health care provider.   Preventive Care for Adults Ages 71 and over  Blood pressure check.** / Every 1 to 2 years.  Lipid and cholesterol check.**/ Every 5 years beginning at age 17.  Lung cancer screening. / Every year if you are aged 77-80 years and have a 30-pack-year history of smoking and currently smoke or have quit within the past 15 years. Yearly screening is stopped once you have quit smoking for at least 15 years or develop a health problem that would prevent you from having lung cancer treatment.  Fecal occult blood test (FOBT) of stool. / Every year beginning at age 39 and continuing until age 53. You may not have to do this test if you get a colonoscopy every 10 years.  Flexible sigmoidoscopy** or colonoscopy.** / Every 5 years for a flexible sigmoidoscopy or every 10 years for a colonoscopy beginning at age 69 and continuing until age 50.  Hepatitis C blood test.** / For all people born from [birth year] through 1963 and any individual with known risks for hepatitis C.  Abdominal aortic aneurysm (AAA) screening.** / A one-time screening for ages 22 to 53 years who are current or former smokers.  Skin self-exam. / Monthly.  Influenza vaccine. / Every year.  Tetanus, diphtheria, and acellular pertussis (Tdap/Td) vaccine.** / 1 dose of Td every 10 years.  Varicella vaccine.** / Consult your health care provider.  Zoster vaccine.** / 1 dose for adults aged 76 years or older.  Pneumococcal 13-valent conjugate (PCV13) vaccine.** / Consult your health care provider.  Pneumococcal polysaccharide (PPSV23) vaccine.** / 1 dose for all adults aged 58 years and older.  Meningococcal vaccine.** / Consult your health care  provider.  Hepatitis A vaccine.** / Consult your health care provider.  Hepatitis B vaccine.** / Consult your health care provider.  Haemophilus influenzae type b (Hib) vaccine.** / Consult your health care provider. **Family history and personal history of risk and conditions may change your health care provider's recommendations. Document Released: 02/08/2002 Document Revised: 12/18/2013 Document Reviewed: 05/10/2011 [facility] Patient Information 2015 [place], [place]. This information is not intended to replace advice given to you by your health care provider. Make sure you discuss any questions you have with your health care provider.

## 2015-10-21 NOTE — Progress Notes (Signed)
Subjective:    Patient ID: [name], male    DOB: Jan 09, 1927, 81 y.o.   MRN: [id number]  DOS:  10/21/2015 Type of visit - description :   Here w/ daughter   1. Risk factors based on Past M, S, F history: reviewed 2. Physical Activities:  Active at home, 30 min of exercise qod at the gym 3. Depression/mood:  Neg screening   4. Hearing:  Uses hearing aids L worse than R, sees ENT also 5. ADL's:  Independent , not driving  6. Fall Risk: prevention discussed , had  physical therapy this year august 7. home Safety: does feel safe at home   8. Height, weight, &visual acuity: see VS, vision ok sees eye doctor yearly, last visit 03-2014 9. Counseling: provided 10. Labs ordered based on risk factors: if needed   11. Referral Coordination: if needed 12.  Care Plan, see assessment and plan   13.   Cognitive Assessment: Motor skills and cognition appropriate for age 64. Providers list updated 15. End of life care: has a HC POA  In addition, today we discussed the following: HTN: good compliance with medication, ambulatory BPs in the 140s. Neuropathy: Saw neurology at [facility], they change to Lyrica  (decreased memory w/   Gabapentin?) AAA-saw vascular surgery 07-2015, note reviewed Chronic renal insufficiency: Saw nephrology 08-2015, BMP and CBC done, he was felt to be doing well   Review of Systems  Constitutional: No fever. No chills. No unexplained wt changes. No unusual sweats  HEENT: No dental problems, no ear discharge, no facial swelling, no voice changes. No eye discharge, no eye  redness , no  intolerance to light   Respiratory: No wheezing , no  difficulty breathing. No cough , no mucus production  Cardiovascular: No CP, no leg swelling , no  Palpitations  GI: no nausea, no vomiting, no diarrhea , no  abdominal pain.  No blood in the stools. No dysphagia, no odynophagia    Endocrine: No polyphagia, no polyuria , no polydipsia  GU: No dysuria, gross hematuria,  difficulty urinating. No urinary urgency, no frequency.  Musculoskeletal: No joint swellings or unusual aches or pains  Skin: No change in the color of the skin, palor , no  Rash  Allergic, immunologic: No environmental allergies , no  food allergies  Neurological: No dizziness no  syncope. No headaches. No diplopia, no slurred, no slurred speech, no motor deficits, no facial  Numbness  Hematological: No enlarged lymph nodes, no easy bruising , no unusual bleedings  Psychiatry: No suicidal ideas, no hallucinations, no beavior problems, no confusion.  No unusual/severe anxiety, no depression  Past Medical History  Diagnosis Date  . Abdominal aortic aneurysm ([place]) 09/2008    s/o endovascular repair and angiogram 10/04/08   . Bleeding ulcer 1997  . Leukopenia 2/11    thought to be from doxy  . Renal cell carcinoma     clear cell type dx 10/09, s/p excision 10-24-08  . Hypertension   . Renal insufficiency   . COPD (chronic obstructive pulmonary disease) ([place])   . Anemia     EGD Cscope 08-2012  . CKD (chronic kidney disease), stage IV ([facility])     Stable, Dr. Dimas Aguas  . Solitary left kidney     W/ chronic tubule-interstitial damage, Dr. Dimas Aguas  . Spinal stenosis     causing LE weakness-persistant, being worked up by Hydrographic surveyor and neurologist  . H/O hyperkalemia     Stable  Past Surgical History  Procedure Laterality Date  . Mohs surgery      1997 (lip), 2004  . Hemorrhoid surgery  1990  . Nephrectomy  2009    Open nephrectomy for cancer  . Abdominal aortic aneurysm repair  2009    EVAR    Social History   Social History  . Marital Status: Married    Spouse Name: N/A  . Number of Children: 3  . Years of Education: N/A   Occupational History  . retired    Social History Main Topics  . Smoking status: Former Smoker    Quit date: 12/28/2007  . Smokeless tobacco: Never Used     Comment: used to smoke 1.5 ppd  . Alcohol Use: No  . Drug Use: No  .  Sexual Activity: Not on file   Other Topics Concern  . Not on file   Social History Narrative   Lives w/ wife who has dementia    Totally independent on ADL , no driving   Often came to office w/ his daughter July [phone]), his other daughter is  [name] , son is Spero                        Family History  Problem Relation Age of Onset  . Diabetes Father     ?  . Stroke Father   . Hypertension Father   . Colon cancer Neg Hx   . Prostate cancer Neg Hx        Medication List       This list is accurate as of: 10/21/15  6:26 PM.  Always use your most recent med list.               Casanthranol-Docusate Sodium 30-100 MG Caps  Take by mouth.     chlorthalidone 25 MG tablet  Commonly known as:  HYGROTON  Take 1 tablet (25 mg total) by mouth daily.     ergocalciferol 50000 UNITS capsule  Commonly known as:  VITAMIN D2  Take 1 capsule (50,000 Units total) by mouth once a week.     gabapentin 300 MG capsule  Commonly known as:  NEURONTIN  Take 300 mg by mouth at bedtime.     hydrALAZINE 25 MG tablet  Commonly known as:  APRESOLINE  Take 1 tablet (25 mg total) by mouth 2 (two) times daily.     labetalol 100 MG tablet  Commonly known as:  NORMODYNE  Take 1 tablet (100 mg total) by mouth 2 (two) times daily.     LYRICA PO  Take 50 mg by mouth daily.     mometasone 50 MCG/ACT nasal spray  Commonly known as:  NASONEX  2 sprays each nares q day     MULTIVITAMIN/IRON PO  Take by mouth.     SE-TAN PLUS 162-115.2-1 MG Caps  Take 1 tablet by mouth daily. Take 1 Tab  daily     zoster vaccine live (PF) 19400 UNT/0.65ML injection  Commonly known as:  ZOSTAVAX  Inject 19,400 Units into the skin once.           Objective:   Physical Exam BP 118/62 mmHg  Pulse 76  Temp(Src) 98.1 F (36.7 C) (Oral)  Ht 5\' 8"  (1.727 m)  Wt 172 lb (78.019 kg)  BMI 26.16 kg/m2  SpO2 95% General:   Well developed, well nourished . NAD.  Neck:   No  thyromegaly    HEENT:  Normocephalic . Face symmetric, atraumatic Lungs:  CTA B Normal respiratory effort, no intercostal retractions, no accessory muscle use. Heart: RRR,  no murmur.  No pretibial edema bilaterally  Abdomen:  Not distended, soft, non-tender. No rebound or rigidity. No bruit Skin: Exposed areas without rash. Not pale. Not jaundice Neurologic:  alert & oriented X3.  Speech normal, gait unassisted, limited by DJD and general debility Strength symmetric and appropriate for age.  Psych: Cognition and judgment appear intact.  Cooperative with normal attention span and concentration.  Behavior appropriate. No anxious or depressed appearing.    Assessment & Plan:   Problems list: Hypertension, COPD--former smoker,PFTs 8- 2013 mild obstruction otherwise normal.Asx  Renal: --H/o Renal Cancer  --Solitary left kidney --Chronic renal insufficiency Neuropathy,  Saw neuro 06-2015, idiopathic? MSK: --DJD --Mild spinal stenosis, has seen  Dr Flavia Shipper Osteopenia--DEXA 07-2015, osteopenia, rx vit D History of leukopenia ---2011 felt to be from doxycycline AAA s/p EVAR 2009 Acne rosacea  Plan: Hypertension: Ambulatory BPs in the 140s, today's 118. No change for now Neuropathy: Switch from gabapentin to Lyrica as a trial  recommended by [facility] neurology, they also planning to follow-up with local neurology Dr. Posey Pronto. AAA:  Saw vascular 07-2015, they recommend a yearly follow-up Hypogonadism: Mild decreased testosterone, results discussed, we agreed on no treatment. Mild anemia: Follow-up by nephrology, utd on cscopes  Mild thrombocytopenia: Recheck a CBC RTC 6-8 months

## 2015-10-21 NOTE — Assessment & Plan Note (Signed)
Td  2011  Flu shot  -- today pneumonia shot  2007 and 09-2012 prevnar --2015  zostavax rx provided today  --Prostate cancer screening, recommend no further screening due to age and overall status. --colonoscopy 05/2007 negative except for tics, Cscope again for anemia 09-11-12:same result  Has a healthy life style

## 2015-10-21 NOTE — Assessment & Plan Note (Signed)
Hypertension: Ambulatory BPs in the 140s, today's 118. No change for now Neuropathy: Switch from gabapentin to Lyrica as a trial  recommended by [facility] neurology, they also planning to follow-up with local neurology Dr. Posey Pronto. AAA:  Saw vascular 07-2015, they recommend a yearly follow-up Hypogonadism: Mild decreased testosterone, results discussed, we agreed on no treatment. Mild anemia: Follow-up by nephrology, utd on cscopes  Mild thrombocytopenia: Recheck a CBC RTC 6-8 months

## 2015-10-21 NOTE — Progress Notes (Signed)
Pre visit review using our clinic review tool, if applicable. No additional management support is needed unless otherwise documented below in the visit note. 

## 2015-10-23 ENCOUNTER — Ambulatory Visit (INDEPENDENT_AMBULATORY_CARE_PROVIDER_SITE_OTHER): Payer: Medicare Other | Admitting: Neurology

## 2015-10-23 ENCOUNTER — Encounter: Payer: Self-pay | Admitting: Neurology

## 2015-10-23 VITALS — BP 130/78 | HR 56 | Wt 176.6 lb

## 2015-10-23 DIAGNOSIS — G20 Parkinson's disease: Secondary | ICD-10-CM

## 2015-10-23 DIAGNOSIS — G609 Hereditary and idiopathic neuropathy, unspecified: Secondary | ICD-10-CM | POA: Diagnosis not present

## 2015-10-23 NOTE — Progress Notes (Signed)
Follow-up Visit   Date: 10/23/2015    [name] MRN: [id number] DOB: [birth year]   Interim History: [name] is a 81 y.o.  right-handed Caucasian male with AAA s/p repair (2009), renal cell carcinoma s/p excision (2009), COPD, hypertension, Stage IV CKD returning to the clinic for follow-up of neuropathy.  The patient was accompanied to the clinic by daughter who also provides collateral information.    History of present illness: Since 2015, he started experiencing heaviness and burning sensation of the feet. He describes the soles of his feet as "hot and bubbly". He was started gabapentin and given a titration schedule to $RemoveBef'300mg'dJEGhvBPhM$  TID. He was taking $RemoveBe'300mg'lAsJANQjq$  qhs and has started to notice some improvement. His main issue is legs feeling heavy which limits his ability to walk. He was previously going to the gym and able to walk 45 minutes, but since February he has been unable to walk long distances. He continues to walk independently and has not had any falls. He feels that PT is helping. He underwent imaging of the lumbar spine which was limited due to artifact, but did not show significant nerve impingement or canal stenosis. EMG of the legs was consistent with generalized sensorimotor polyneuropathy.   UPDATE 10/23/2015:  They were very pleased with their second opinion with Dr. Ileene Rubens last week at [facility] who agreed with diagnosis of idiopathic neuropathy and also noted signs of parkinson's disease on exam.  Because of sedation on gabapentin, he was offered low dose trial of Lyrica $RemoveB'50mg'hdMqCqaA$  which he started a few days ago.  He has not noticed significant improvement yet.  He is scheduled for a return visit in February.  His gait is slow and walks with small steps, but continues to walk independently without falls.  Denies tremors or difficulty swallowing.   Medications:  Current Outpatient Prescriptions on File Prior to Visit  Medication Sig Dispense Refill  .  Casanthranol-Docusate Sodium 30-100 MG CAPS Take by mouth.      . chlorthalidone (HYGROTON) 25 MG tablet Take 1 tablet (25 mg total) by mouth daily. 30 tablet 6  . ergocalciferol (VITAMIN D2) 50000 UNITS capsule Take 1 capsule (50,000 Units total) by mouth once a week.    . FeFum-FePo-FA-B Cmp-C-Zn-Mn-Cu (SE-TAN PLUS) 162-115.2-1 MG CAPS Take 1 tablet by mouth daily. Take 1 Tab  daily    . hydrALAZINE (APRESOLINE) 25 MG tablet Take 1 tablet (25 mg total) by mouth 2 (two) times daily. 60 tablet 6  . labetalol (NORMODYNE) 100 MG tablet Take 1 tablet (100 mg total) by mouth 2 (two) times daily. 60 tablet 6  . mometasone (NASONEX) 50 MCG/ACT nasal spray 2 sprays each nares q day 17 g 0  . Multiple Vitamins-Iron (MULTIVITAMIN/IRON PO) Take by mouth.      . Pregabalin (LYRICA PO) Take 50 mg by mouth daily.     Marland Kitchen zoster vaccine live, PF, (ZOSTAVAX) 82800 UNT/0.65ML injection Inject 19,400 Units into the skin once. 1 each 0   No current facility-administered medications on file prior to visit.    Allergies:  Allergies  Allergen Reactions  . Baclofen Other (See Comments)    Unable to walk per Pt's family   . Iodinated Diagnostic Agents     Only one kidney     Review of Systems:  CONSTITUTIONAL: No fevers, chills, night sweats, or weight loss.  EYES: No visual changes or eye pain ENT: No hearing changes.  No history of nose bleeds.   RESPIRATORY:  No cough, wheezing and shortness of breath.   CARDIOVASCULAR: Negative for chest pain, and palpitations.   GI: Negative for abdominal discomfort, blood in stools or black stools.  No recent change in bowel habits.   GU:  No history of incontinence.   MUSCLOSKELETAL: No history of joint pain or swelling.  No myalgias.   SKIN: Negative for lesions, rash, and itching.   ENDOCRINE: Negative for cold or heat intolerance, polydipsia or goiter.   PSYCH:  No depression or anxiety symptoms.   NEURO: As Above.   Vital Signs:  BP 130/78 mmHg  Pulse 56   Wt 176 lb 9 oz (80.088 kg)  SpO2 96%  Neurological Exam: MENTAL STATUS including orientation to time, place, person.  Mildly blunted affect.  Speech is clear and with normal prosody.  CRANIAL NERVES: Pupils equal round and reactive to light.  Normal conjugate, extra-ocular eye movements in all directions of gaze.  Face is symmetric. Palate elevates symmetrically.  Tongue is midline.  MOTOR:  Motor strength is 5/5 in all extremities, except toe extensions 4+/5 and toe flexors 5-/5 bilaterally.  No pronator drift.  Subtle rigidity of the upper extremities.   No tremor on today's exam.   MSRs:  Reflexes are 2+/4 throughout, except absent Achilles bilaterally.  SENSORY:  Vibration reduced at knees and absent distal to ankles bilaterally  COORDINATION/GAIT:  Reduced amplitude and rate of finger and toe tapping bilaterally.  Stooped posture with small shuffling step, turns with 4 steps.    Data: EMG of the legs 07/08/2015: 1. The electrophysiologic findings are most consistent with a generalized sensorimotor polyneuropathy, axon loss in type, affecting the lower extremities. Overall, these findings are moderate to severe in degree electrically. 2. There is no evidence of a superimposed lumbosacral radiculopathy affecting the lower extremities.  MRI lumbar spine 05/13/2015: Unfortunately, there is artifact obscuring portions of the lumbar spine secondary to aortic stent graft. Mild spinal stenosis at L3-4 and L4-5 Grade 1 slip L5-S1 without significant spinal stenosis.  Labs 06/09/2015: ESR 11, SPEP with IFE no M protein, HbA1c 5.1, vitamin D 30, vitamin B12 500, folate >24  Labs 07/21/2015:  CK 146, TSH 1.65, SPEP/UPEP with no M protein, AChR antibody negative  IMPRESSION/PLAN: 1.  Idiopathic peripheral neuropathy   - Other contributing factors: history of uremia and possible occupational organophosphate exposure   - Neuropathy labs are unrevealing  - EMG with axonal sensorimotor  polyneuropathy  - Previously tried:  Gabapentin (sedation)  - Started on Lyrica $RemoveB'50mg'iPaUjdUI$  at bedtime (low dose due to CKD).  Counseled that it may take several weeks to see the benefit of the medication so would not make any changes at this time. Further, to avoid confusion it is best to follow-up with one neurologist and they should decide whether to maintain care locally or at [facility].  At this time, they would like to continue to see Dr. Ileene Rubens  recommend that they choose to follow-up with either Dr. Ileene Rubens or myself  2.  Parkinsonian features  - To minimize adverse effects, I do not recommend starting multiple medications simultaneously.  Therefore, at his next follow-up at [facility], agree with sinemet trial   Return to clinic as needed.   The duration of this appointment visit was 20 minutes of face-to-face time with the patient.  Greater than 50% of this time was spent in counseling, explanation of diagnosis, planning of further management, and coordination of care.   Thank you for allowing me to participate in patient's  care.  If I can answer any additional questions, I would be pleased to do so.    Sincerely,    Sharae Zappulla K. Posey Pronto, DO

## 2015-11-12 DIAGNOSIS — Z79899 Other long term (current) drug therapy: Secondary | ICD-10-CM | POA: Diagnosis not present

## 2015-11-12 DIAGNOSIS — D61818 Other pancytopenia: Secondary | ICD-10-CM | POA: Diagnosis not present

## 2015-11-12 DIAGNOSIS — D72819 Decreased white blood cell count, unspecified: Secondary | ICD-10-CM | POA: Diagnosis not present

## 2015-11-12 DIAGNOSIS — D696 Thrombocytopenia, unspecified: Secondary | ICD-10-CM | POA: Diagnosis not present

## 2015-11-12 DIAGNOSIS — N189 Chronic kidney disease, unspecified: Secondary | ICD-10-CM | POA: Diagnosis not present

## 2015-11-12 DIAGNOSIS — D631 Anemia in chronic kidney disease: Secondary | ICD-10-CM | POA: Diagnosis not present

## 2015-11-22 DIAGNOSIS — H60502 Unspecified acute noninfective otitis externa, left ear: Secondary | ICD-10-CM | POA: Diagnosis not present

## 2015-11-28 ENCOUNTER — Encounter: Payer: Self-pay | Admitting: Medical

## 2015-11-28 ENCOUNTER — Ambulatory Visit (INDEPENDENT_AMBULATORY_CARE_PROVIDER_SITE_OTHER): Payer: Medicare Other | Admitting: Medical

## 2015-11-28 ENCOUNTER — Telehealth: Payer: Self-pay | Admitting: Internal Medicine

## 2015-11-28 ENCOUNTER — Ambulatory Visit (HOSPITAL_BASED_OUTPATIENT_CLINIC_OR_DEPARTMENT_OTHER)
Admission: RE | Admit: 2015-11-28 | Discharge: 2015-11-28 | Disposition: A | Discharge status: Home / Self Care | Payer: Medicare Other | Source: Ambulatory Visit | Attending: Medical | Admitting: Medical

## 2015-11-28 VITALS — BP 145/70 | HR 75 | Temp 98.0°F | Wt 172.0 lb

## 2015-11-28 DIAGNOSIS — I700 Atherosclerosis of aorta: Secondary | ICD-10-CM | POA: Insufficient documentation

## 2015-11-28 DIAGNOSIS — J209 Acute bronchitis, unspecified: Secondary | ICD-10-CM

## 2015-11-28 DIAGNOSIS — J984 Other disorders of lung: Secondary | ICD-10-CM | POA: Insufficient documentation

## 2015-11-28 DIAGNOSIS — R5383 Other fatigue: Secondary | ICD-10-CM | POA: Diagnosis not present

## 2015-11-28 DIAGNOSIS — R05 Cough: Secondary | ICD-10-CM | POA: Diagnosis not present

## 2015-11-28 LAB — CBC WITH DIFFERENTIAL/PLATELET
Basophils Absolute: 0 10*3/uL (ref 0.0–0.1)
Basophils Relative: 1 % (ref 0–1)
EOS PCT: 4 % (ref 0–5)
Eosinophils Absolute: 0.1 10*3/uL (ref 0.0–0.7)
HEMATOCRIT: 34.8 % — AB (ref 39.0–52.0)
HEMOGLOBIN: 11.4 g/dL — AB (ref 13.0–17.0)
LYMPHS ABS: 1.1 10*3/uL (ref 0.7–4.0)
LYMPHS PCT: 30 % (ref 12–46)
MCH: 30.8 pg (ref 26.0–34.0)
MCHC: 32.8 g/dL (ref 30.0–36.0)
MCV: 94.1 fL (ref 78.0–100.0)
MONO ABS: 0.5 10*3/uL (ref 0.1–1.0)
MONOS PCT: 14 % — AB (ref 3–12)
MPV: 10.5 fL (ref 8.6–12.4)
Neutro Abs: 1.9 10*3/uL (ref 1.7–7.7)
Neutrophils Relative %: 51 % (ref 43–77)
Platelets: 135 10*3/uL — ABNORMAL LOW (ref 150–400)
RBC: 3.7 MIL/uL — ABNORMAL LOW (ref 4.22–5.81)
RDW: 13.7 % (ref 11.5–15.5)
WBC: 3.7 10*3/uL — ABNORMAL LOW (ref 4.0–10.5)

## 2015-11-28 LAB — COMPREHENSIVE METABOLIC PANEL
ALBUMIN: 4 g/dL (ref 3.6–5.1)
ALK PHOS: 47 U/L (ref 40–115)
ALT: 9 U/L (ref 9–46)
AST: 13 U/L (ref 10–35)
BUN: 35 mg/dL — ABNORMAL HIGH (ref 7–25)
CALCIUM: 8.5 mg/dL — AB (ref 8.6–10.3)
CO2: 24 mmol/L (ref 20–31)
Chloride: 112 mmol/L — ABNORMAL HIGH (ref 98–110)
Creat: 2.41 mg/dL — ABNORMAL HIGH (ref 0.70–1.11)
GLUCOSE: 95 mg/dL (ref 65–99)
POTASSIUM: 4.7 mmol/L (ref 3.5–5.3)
Sodium: 141 mmol/L (ref 135–146)
Total Bilirubin: 0.4 mg/dL (ref 0.2–1.2)
Total Protein: 6.3 g/dL (ref 6.1–8.1)

## 2015-11-28 MED ORDER — BENZONATATE 100 MG PO CAPS: 100.0000 mg | ORAL_CAPSULE | Freq: Three times a day (TID) | ORAL | Status: DC | PRN

## 2015-11-28 MED ORDER — AZITHROMYCIN 250 MG PO TABS: ORAL_TABLET | ORAL | Status: DC

## 2015-11-28 MED ORDER — ALBUTEROL SULFATE HFA 108 (90 BASE) MCG/ACT IN AERS: 2.0000 | INHALATION_SPRAY | Freq: Four times a day (QID) | RESPIRATORY_TRACT | Status: DC | PRN

## 2015-11-28 NOTE — Progress Notes (Signed)
Pre visit review using our clinic review tool, if applicable. No additional management support is needed unless otherwise documented below in the visit note. 

## 2015-11-28 NOTE — Progress Notes (Signed)
Subjective:    Patient ID: [name], male    DOB: 05-24-1927, 81 y.o.   MRN: [id number]  HPI  Pt in with runny nose and coughing up some mucous for about one week. Pt had some mild left ear irritation. UC gave him some drops last Saturday. So daughter wants me to check. Pt has no ear pain.   Nasal congestion. No sinus pressure. Feels chest congestion.  Tired recently but no fevers, no chills or sweats.  Hx of smoking but none for 50 years.    Review of Systems  Constitutional: Positive for fatigue. Negative for fever and chills.  Respiratory: Positive for cough. Negative for chest tightness, shortness of breath and wheezing.   Cardiovascular: Negative for chest pain and palpitations.  Gastrointestinal: Negative for abdominal pain.  Musculoskeletal: Negative for back pain.  Skin: Negative for rash.  Neurological: Negative for dizziness, light-headedness and headaches.  Hematological: Negative for adenopathy. Does not bruise/bleed easily.  Psychiatric/Behavioral: Negative for behavioral problems and confusion.    Past Medical History  Diagnosis Date  . Abdominal aortic aneurysm ([place]) 09/2008    s/o endovascular repair and angiogram 10/04/08   . Bleeding ulcer 1997  . Leukopenia 2/11    thought to be from doxy  . Renal cell carcinoma     clear cell type dx 10/09, s/p excision 10-24-08  . Hypertension   . Renal insufficiency   . COPD (chronic obstructive pulmonary disease) ([place])   . Anemia     EGD Cscope 08-2012  . CKD (chronic kidney disease), stage IV (HCC)     Stable, Dr. Dimas Aguas  . Solitary left kidney     W/ chronic tubule-interstitial damage, Dr. Dimas Aguas  . Spinal stenosis     causing LE weakness-persistant, being worked up by Hydrographic surveyor and neurologist  . H/O hyperkalemia     Stable    Social History   Social History  . Marital Status: Married    Spouse Name: N/A  . Number of Children: 3  . Years of Education: N/A   Occupational History    . retired    Social History Main Topics  . Smoking status: Former Smoker    Quit date: 12/28/2007  . Smokeless tobacco: Never Used     Comment: used to smoke 1.5 ppd  . Alcohol Use: No  . Drug Use: No  . Sexual Activity: Not on file   Other Topics Concern  . Not on file   Social History Narrative   Lives w/ wife who has dementia    Totally independent on ADL , no driving   Often came to office w/ his daughter July [phone]), his other daughter is  [name] , son is Newt                       Past Surgical History  Procedure Laterality Date  . Mohs surgery      1997 (lip), 2004  . Hemorrhoid surgery  1990  . Nephrectomy  2009    Open nephrectomy for cancer  . Abdominal aortic aneurysm repair  2009    EVAR    Family History  Problem Relation Age of Onset  . Diabetes Father     ?  . Stroke Father   . Hypertension Father   . Colon cancer Neg Hx   . Prostate cancer Neg Hx     Allergies  Allergen Reactions  . Baclofen Other (See Comments)    Unable  to walk per Pt's family   . Iodinated Diagnostic Agents     Only one kidney     Current Outpatient Prescriptions on File Prior to Visit  Medication Sig Dispense Refill  . Casanthranol-Docusate Sodium 30-100 MG CAPS Take by mouth.      . chlorthalidone (HYGROTON) 25 MG tablet Take 1 tablet (25 mg total) by mouth daily. 30 tablet 6  . ergocalciferol (VITAMIN D2) 50000 UNITS capsule Take 1 capsule (50,000 Units total) by mouth once a week.    . FeFum-FePo-FA-B Cmp-C-Zn-Mn-Cu (SE-TAN PLUS) 162-115.2-1 MG CAPS Take 1 tablet by mouth daily. Take 1 Tab  daily    . hydrALAZINE (APRESOLINE) 25 MG tablet Take 1 tablet (25 mg total) by mouth 2 (two) times daily. 60 tablet 6  . labetalol (NORMODYNE) 100 MG tablet Take 1 tablet (100 mg total) by mouth 2 (two) times daily. 60 tablet 6  . pregabalin (LYRICA) 50 MG capsule Take 50 mg by mouth 3 (three) times daily.    Marland Kitchen zoster vaccine live, PF, (ZOSTAVAX) 60454 UNT/0.65ML  injection Inject 19,400 Units into the skin once. 1 each 0  . mometasone (NASONEX) 50 MCG/ACT nasal spray 2 sprays each nares q day (Patient not taking: Reported on 11/28/2015) 17 g 0  . Multiple Vitamins-Iron (MULTIVITAMIN/IRON PO) Take by mouth.      . Pregabalin (LYRICA PO) Take 50 mg by mouth daily.      No current facility-administered medications on file prior to visit.    BP 145/70 mmHg  Pulse 75  Temp(Src) 98 F (36.7 C)  Wt 172 lb (78.019 kg)  SpO2 94%       Objective:   Physical Exam  General  Mental Status - Alert. General Appearance - Well groomed. Not in acute distress.  Skin Rashes- No Rashes.  HEENT Head- Normal. Ear Auditory Canal - Left- Normal. Right - Normal.Tympanic Membrane- Left- Normal. Right- Normal. Eye Sclera/Conjunctiva- Left- Normal. Right- Normal. Nose & Sinuses Nasal Mucosa- Left-  Not boggy or Congested. Right-  Not  boggy or Congested. Mouth & Throat Lips: Upper Lip- Normal: no dryness, cracking, pallor, cyanosis, or vesicular eruption. Lower Lip-Normal: no dryness, cracking, pallor, cyanosis or vesicular eruption. Buccal Mucosa- Bilateral- No Aphthous ulcers. Oropharynx- No Discharge or Erythema. Tonsils: Characteristics- Bilateral- No Erythema or Congestion. Size/Enlargement- Bilateral- No enlargement. Discharge- bilateral-None.  Neck Neck- Supple. No Masses.   Chest and Lung Exam Auscultation: Breath Sounds:- even and unlabored(but shallow),  bilateral upper lobe rhonchi.  Cardiovascular Auscultation:Rythm- Regular, rate and rhythm. Murmurs & Other Heart Sounds:Ausculatation of the heart reveal- No Murmurs.  Lymphatic Head & Neck General Head & Neck Lymphatics: Bilateral: Description- No Localized lymphadenopathy.       Assessment & Plan:  For bronchitis start azithromycin.  For cough benzonatate.  cxr stat today. Will make sure no pneumonia.  Get cbc and cmp stat today.  Would recommend calling then Ent for  evaluation of postion of tube and effect has on hearing aid position.  Follow up 7 days or as needed(maybe sooner depending on study results.

## 2015-11-28 NOTE — Patient Instructions (Addendum)
For bronchitis start azithromycin.  For cough benzonatate.  cxr stat today. Will make sure no pneumonia.  Get cbc and cmp stat today.  Would recommend calling then Ent for evaluation of postion of tube and effect has on hearing aid position.  Follow up 7 days or as needed(maybe sooner depending on study results.  With pt smoking hx and upcoming weekend will make albuterol available in event any wheezing

## 2015-11-28 NOTE — Telephone Encounter (Signed)
error:[id number] ° °

## 2015-12-01 DIAGNOSIS — Z85528 Personal history of other malignant neoplasm of kidney: Secondary | ICD-10-CM | POA: Diagnosis not present

## 2015-12-01 DIAGNOSIS — H9202 Otalgia, left ear: Secondary | ICD-10-CM | POA: Diagnosis not present

## 2015-12-01 DIAGNOSIS — H6980 Other specified disorders of Eustachian tube, unspecified ear: Secondary | ICD-10-CM | POA: Diagnosis not present

## 2015-12-01 DIAGNOSIS — H6092 Unspecified otitis externa, left ear: Secondary | ICD-10-CM | POA: Diagnosis not present

## 2015-12-11 DIAGNOSIS — L820 Inflamed seborrheic keratosis: Secondary | ICD-10-CM | POA: Diagnosis not present

## 2015-12-23 DIAGNOSIS — N183 Chronic kidney disease, stage 3 (moderate): Secondary | ICD-10-CM | POA: Diagnosis not present

## 2015-12-23 DIAGNOSIS — E559 Vitamin D deficiency, unspecified: Secondary | ICD-10-CM | POA: Diagnosis not present

## 2015-12-23 LAB — CBC AND DIFFERENTIAL
HEMATOCRIT: 35 % — AB (ref 41–53)
HEMOGLOBIN: 11.4 g/dL — AB (ref 13.5–17.5)
Platelets: 113 10*3/uL — AB (ref 150–399)
WBC: 3.7 10^3/mL

## 2015-12-23 LAB — BASIC METABOLIC PANEL
BUN: 31 mg/dL — AB (ref 4–21)
Creatinine: 2.4 mg/dL — AB (ref 0.6–1.3)
Glucose: 95 mg/dL
Potassium: 4.7 mmol/L (ref 3.4–5.3)
SODIUM: 141 mmol/L (ref 137–147)

## 2015-12-25 DIAGNOSIS — N390 Urinary tract infection, site not specified: Secondary | ICD-10-CM | POA: Diagnosis not present

## 2015-12-25 DIAGNOSIS — N183 Chronic kidney disease, stage 3 (moderate): Secondary | ICD-10-CM | POA: Diagnosis not present

## 2015-12-25 DIAGNOSIS — I10 Essential (primary) hypertension: Secondary | ICD-10-CM | POA: Diagnosis not present

## 2016-01-01 ENCOUNTER — Encounter: Payer: Self-pay | Admitting: Internal Medicine

## 2016-02-25 DIAGNOSIS — G629 Polyneuropathy, unspecified: Secondary | ICD-10-CM | POA: Diagnosis not present

## 2016-02-25 DIAGNOSIS — G219 Secondary parkinsonism, unspecified: Secondary | ICD-10-CM | POA: Diagnosis not present

## 2016-02-25 DIAGNOSIS — Z79899 Other long term (current) drug therapy: Secondary | ICD-10-CM | POA: Diagnosis not present

## 2016-02-25 DIAGNOSIS — G20 Parkinson's disease: Secondary | ICD-10-CM | POA: Diagnosis not present

## 2016-03-17 DIAGNOSIS — R6889 Other general symptoms and signs: Secondary | ICD-10-CM | POA: Diagnosis not present

## 2016-03-17 DIAGNOSIS — E559 Vitamin D deficiency, unspecified: Secondary | ICD-10-CM | POA: Diagnosis not present

## 2016-03-24 DIAGNOSIS — N390 Urinary tract infection, site not specified: Secondary | ICD-10-CM | POA: Diagnosis not present

## 2016-03-24 DIAGNOSIS — G259 Extrapyramidal and movement disorder, unspecified: Secondary | ICD-10-CM | POA: Diagnosis not present

## 2016-03-24 DIAGNOSIS — R4189 Other symptoms and signs involving cognitive functions and awareness: Secondary | ICD-10-CM | POA: Diagnosis not present

## 2016-03-24 DIAGNOSIS — N183 Chronic kidney disease, stage 3 (moderate): Secondary | ICD-10-CM | POA: Diagnosis not present

## 2016-03-24 DIAGNOSIS — G629 Polyneuropathy, unspecified: Secondary | ICD-10-CM | POA: Diagnosis not present

## 2016-03-24 DIAGNOSIS — R400 Somnolence: Secondary | ICD-10-CM | POA: Diagnosis not present

## 2016-03-24 DIAGNOSIS — I10 Essential (primary) hypertension: Secondary | ICD-10-CM | POA: Diagnosis not present

## 2016-03-29 DIAGNOSIS — M810 Age-related osteoporosis without current pathological fracture: Secondary | ICD-10-CM | POA: Diagnosis not present

## 2016-03-29 DIAGNOSIS — M4806 Spinal stenosis, lumbar region: Secondary | ICD-10-CM | POA: Diagnosis not present

## 2016-03-31 ENCOUNTER — Other Ambulatory Visit: Payer: Self-pay | Admitting: Internal Medicine

## 2016-03-31 ENCOUNTER — Other Ambulatory Visit: Payer: Self-pay

## 2016-03-31 DIAGNOSIS — Z79899 Other long term (current) drug therapy: Secondary | ICD-10-CM | POA: Diagnosis not present

## 2016-03-31 DIAGNOSIS — D72819 Decreased white blood cell count, unspecified: Secondary | ICD-10-CM | POA: Diagnosis not present

## 2016-03-31 DIAGNOSIS — D631 Anemia in chronic kidney disease: Secondary | ICD-10-CM | POA: Diagnosis not present

## 2016-03-31 DIAGNOSIS — N189 Chronic kidney disease, unspecified: Secondary | ICD-10-CM | POA: Diagnosis not present

## 2016-03-31 DIAGNOSIS — D696 Thrombocytopenia, unspecified: Secondary | ICD-10-CM | POA: Diagnosis not present

## 2016-03-31 DIAGNOSIS — D61818 Other pancytopenia: Secondary | ICD-10-CM | POA: Diagnosis not present

## 2016-03-31 MED ORDER — HYDRALAZINE HCL 25 MG PO TABS: 25.0000 mg | ORAL_TABLET | Freq: Two times a day (BID) | ORAL | Status: DC

## 2016-04-02 DIAGNOSIS — R269 Unspecified abnormalities of gait and mobility: Secondary | ICD-10-CM | POA: Diagnosis not present

## 2016-04-02 DIAGNOSIS — I639 Cerebral infarction, unspecified: Secondary | ICD-10-CM | POA: Diagnosis not present

## 2016-04-02 DIAGNOSIS — R4189 Other symptoms and signs involving cognitive functions and awareness: Secondary | ICD-10-CM | POA: Diagnosis not present

## 2016-04-12 ENCOUNTER — Encounter: Payer: Self-pay | Admitting: Internal Medicine

## 2016-04-12 ENCOUNTER — Ambulatory Visit (INDEPENDENT_AMBULATORY_CARE_PROVIDER_SITE_OTHER): Payer: Medicare Other | Admitting: Internal Medicine

## 2016-04-12 VITALS — BP 124/62 | HR 77 | Temp 98.0°F | Ht 68.0 in | Wt 164.4 lb

## 2016-04-12 DIAGNOSIS — F329 Major depressive disorder, single episode, unspecified: Secondary | ICD-10-CM

## 2016-04-12 DIAGNOSIS — G629 Polyneuropathy, unspecified: Secondary | ICD-10-CM

## 2016-04-12 DIAGNOSIS — E785 Hyperlipidemia, unspecified: Secondary | ICD-10-CM

## 2016-04-12 DIAGNOSIS — I10 Essential (primary) hypertension: Secondary | ICD-10-CM

## 2016-04-12 DIAGNOSIS — F32A Depression, unspecified: Secondary | ICD-10-CM

## 2016-04-12 DIAGNOSIS — E7800 Pure hypercholesterolemia, unspecified: Secondary | ICD-10-CM

## 2016-04-12 LAB — LIPID PANEL
CHOLESTEROL: 177 mg/dL (ref 0–200)
HDL: 42.4 mg/dL (ref >39.00)
LDL Cholesterol: 117 mg/dL — ABNORMAL HIGH (ref 0–99)
NonHDL: 134.53
TRIGLYCERIDES: 89 mg/dL (ref 0.0–149.0)
Total CHOL/HDL Ratio: 4
VLDL: 17.8 mg/dL (ref 0.0–40.0)

## 2016-04-12 LAB — AST: AST: 15 U/L (ref 0–37)

## 2016-04-12 LAB — ALT: ALT: 11 U/L (ref 0–53)

## 2016-04-12 NOTE — Progress Notes (Signed)
Pre visit review using our clinic review tool, if applicable. No additional management support is needed unless otherwise documented below in the visit note. 

## 2016-04-12 NOTE — Progress Notes (Signed)
Subjective:    Patient ID: [name], male    DOB: [birth year], 82 y.o.   MRN: [id number]  DOS:  04/12/2016 Type of visit - description : rov, here with his daughter [name] Interval history: Neuropathy: Last visit with local neurologist 10/23/2015 Renal insufficiency: Last visit to nephrology 11-2015, felt to be stable Last visit to neurology 03/12/2016: They were planning a MRI, possible lumbar punction vs paraneoplastic workup; sleep study? Was a started on an SSRI, question of depression due to the loss of his wife, daughter did not increase Zoloft  Dose > than  25 mg daily. Review of Systems   Past Medical History  Diagnosis Date  . Abdominal aortic aneurysm ([place]) 09/2008    s/o endovascular repair and angiogram 10/04/08   . Bleeding ulcer 1997  . Leukopenia 2/11    thought to be from doxy  . Renal cell carcinoma     clear cell type dx 10/09, s/p excision 10-24-08  . Hypertension   . Renal insufficiency   . COPD (chronic obstructive pulmonary disease) ([place])   . Anemia     EGD Cscope 08-2012  . CKD (chronic kidney disease), stage IV (HCC)     Stable, Dr. Dimas Aguas  . Solitary left kidney     W/ chronic tubule-interstitial damage, Dr. Dimas Aguas  . Spinal stenosis     causing LE weakness-persistant, being worked up by Hydrographic surveyor and neurologist  . H/O hyperkalemia     Stable    Past Surgical History  Procedure Laterality Date  . Mohs surgery      1997 (lip), 2004  . Hemorrhoid surgery  1990  . Nephrectomy  2009    Open nephrectomy for cancer  . Abdominal aortic aneurysm repair  2009    EVAR    Social History   Social History  . Marital Status: Widowed    Spouse Name: N/A  . Number of Children: 3  . Years of Education: N/A   Occupational History  . retired    Social History Main Topics  . Smoking status: Former Smoker    Quit date: 12/28/2007  . Smokeless tobacco: Never Used     Comment: used to smoke 1.5 ppd  . Alcohol Use: No  . Drug Use: No   . Sexual Activity: Not on file   Other Topics Concern  . Not on file   Social History Narrative   Lost wife to dementia 10-2015   Totally independent on ADL , no driving   Often came to office w/ his daughter July [phone]), his other daughter is  [name] , son is Azlaan                           Medication List       This list is accurate as of: 04/12/16  2:02 PM.  Always use your most recent med list.               albuterol 108 (90 Base) MCG/ACT inhaler  Commonly known as:  PROVENTIL HFA;VENTOLIN HFA  Inhale 2 puffs into the lungs every 6 (six) hours as needed for wheezing or shortness of breath.     Casanthranol-Docusate Sodium 30-100 MG Caps  Take by mouth.     chlorthalidone 25 MG tablet  Commonly known as:  HYGROTON  Take 1 tablet (25 mg total) by mouth daily.     gabapentin 100 MG capsule  Commonly known as:  NEURONTIN  Take 100 mg by mouth 3 (three) times daily.     hydrALAZINE 25 MG tablet  Commonly known as:  APRESOLINE  Take 1 tablet (25 mg total) by mouth 2 (two) times daily.     labetalol 100 MG tablet  Commonly known as:  NORMODYNE  Take 1 tablet (100 mg total) by mouth 2 (two) times daily.     mometasone 50 MCG/ACT nasal spray  Commonly known as:  NASONEX  2 sprays each nares q day     SE-TAN PLUS 162-115.2-1 MG Caps  Take 1 tablet by mouth daily. Take 1 Tab  daily     sertraline 25 MG tablet  Commonly known as:  ZOLOFT  Take 25 mg by mouth 2 (two) times daily.     Vitamin D 2000 units Caps  Take 2,000 Units by mouth daily.           Objective:   Physical Exam BP 124/62 mmHg  Pulse 77  Temp(Src) 98 F (36.7 C) (Oral)  Ht 5\' 8"  (1.727 m)  Wt 164 lb 6 oz (74.56 kg)  BMI 25.00 kg/m2  SpO2 95% General:   Well developed, well nourished . NAD.  HEENT:  Normocephalic . Face symmetric, atraumatic Lungs:  CTA B Normal respiratory effort, no intercostal retractions, no accessory muscle use. Heart: RRR,  no murmur.  No  pretibial edema bilaterally  Skin: Not pale. Not jaundice Neurologic:  alert & oriented X3.  Speech normal, gait slow, quite limited, unassisted. Psych--    No anxious or depressed appearing.      Assessment & Plan:   Assessment  Hypertension, COPD--former smoker,PFTs 8- 2013 mild obstruction otherwise normal.Asx  Renal: --H/o Renal Cancer  --Solitary left kidney --Chronic renal insufficiency Neuropathy,  Saw neuro 06-2015, Dr Posey Pronto. As off 03-2016 is followed at [facility]:  Dr. Blair Promise gen neuro, Dr Tonye Royalty (movement d/o specialist)   MSK: --DJD --Mild spinal stenosis, has seen  Dr Flavia Shipper Osteopenia--DEXA 07-2015, osteopenia, rx vit D History of leukopenia ---2011 felt to be from doxycycline AAA s/p EVAR 2009 Acne rosacea  Plan: HTN: Good compliance of medication, ambulatory BPs in the 140s/70, was told by nephrology 140 is at goal; no change. Neuropathy, gait disorder: We had a long conversation about this issue, currently doing PT, follow-up by 2 neurologists at [facility], no major improvement. Wonders if gabapentin dose needs to be increase, recommend to discuss with Dr. Blair Promise. Also question the need to sleep study. If that is not going to change management significantly I am okay if they canceled the study and discuss further with neurology.  Depression? Recently started Zoloft for possible depression, the patient's daughter decided not to increase the dose from 25 to 50 mg. We agreed to increase it to 50 and see how he does. If there is no benefit she could discontinue the medication (she is not  convinced her father need that medication to begging with) Dyslipidemia: Check a FLP, AST, ALT  Today, I spent more than 35   min with the patient and his daughter no answering multiple questions about the need to further evaluation, benefits of Zoloft, dosing of gabapentin.

## 2016-04-12 NOTE — Patient Instructions (Signed)
GO TO THE LAB :      Get the blood work     GO TO THE FRONT DESK Schedule your next appointment for a  routine checkup, nonfasting, in 4-5 months

## 2016-04-12 NOTE — Assessment & Plan Note (Signed)
HTN: Good compliance of medication, ambulatory BPs in the 140s/70, was told by nephrology 140 is at goal; no change. Neuropathy, gait disorder: We had a long conversation about this issue, currently doing PT, follow-up by 2 neurologists at [facility], no major improvement. Wonders if gabapentin dose needs to be increase, recommend to discuss with Dr. Blair Promise. Also question the need to sleep study. If that is not going to change management significantly I am okay if they canceled the study and discuss further with neurology.  Depression? Recently started Zoloft for possible depression, the patient's daughter decided not to increase the dose from 25 to 50 mg. We agreed to increase it to 50 and see how he does. If there is no benefit she could discontinue the medication (she is not  convinced her father need that medication to begging with) Dyslipidemia: Check a FLP, AST, ALT

## 2016-04-13 ENCOUNTER — Ambulatory Visit: Payer: Medicare Other | Attending: Specialist | Admitting: Physical Therapy

## 2016-04-13 DIAGNOSIS — M4806 Spinal stenosis, lumbar region: Secondary | ICD-10-CM | POA: Insufficient documentation

## 2016-04-13 DIAGNOSIS — R2681 Unsteadiness on feet: Secondary | ICD-10-CM

## 2016-04-13 DIAGNOSIS — M6281 Muscle weakness (generalized): Secondary | ICD-10-CM | POA: Diagnosis not present

## 2016-04-13 DIAGNOSIS — R262 Difficulty in walking, not elsewhere classified: Secondary | ICD-10-CM | POA: Insufficient documentation

## 2016-04-13 DIAGNOSIS — R2689 Other abnormalities of gait and mobility: Secondary | ICD-10-CM | POA: Diagnosis not present

## 2016-04-13 DIAGNOSIS — R279 Unspecified lack of coordination: Secondary | ICD-10-CM | POA: Insufficient documentation

## 2016-04-13 DIAGNOSIS — R269 Unspecified abnormalities of gait and mobility: Secondary | ICD-10-CM | POA: Diagnosis not present

## 2016-04-13 NOTE — Therapy (Signed)
[facility] [address]  [place], [place], [address] Phone: [phone]   Fax:  [phone]  Physical Therapy Evaluation  Patient Details  Name: [name] MRN: [id number] Date of Birth: [birth year] Referring Provider: Basil Dess, MD  Encounter Date: 04/13/2016      PT End of Session - 04/13/16 1920    Visit Number 1   Number of Visits 80   Date for PT Re-Evaluation 06/08/16   PT Start Time 1310   PT Stop Time 1402   PT Time Calculation (min) 52 min   Activity Tolerance Patient tolerated treatment well   Behavior During Therapy [facility] for tasks assessed/performed      Past Medical History  Diagnosis Date  . Abdominal aortic aneurysm ([place]) 09/2008    s/o endovascular repair and angiogram 10/04/08   . Bleeding ulcer 1997  . Leukopenia 2/11    thought to be from doxy  . Renal cell carcinoma     clear cell type dx 10/09, s/p excision 10-24-08  . Hypertension   . Renal insufficiency   . COPD (chronic obstructive pulmonary disease) ([place])   . Anemia     EGD Cscope 08-2012  . CKD (chronic kidney disease), stage IV (HCC)     Stable, Dr. Dimas Aguas  . Solitary left kidney     W/ chronic tubule-interstitial damage, Dr. Dimas Aguas  . Spinal stenosis     causing LE weakness-persistant, being worked up by Hydrographic surveyor and neurologist  . H/O hyperkalemia     Stable    Past Surgical History  Procedure Laterality Date  . Mohs surgery      1997 (lip), 2004  . Hemorrhoid surgery  1990  . Nephrectomy  2009    Open nephrectomy for cancer  . Abdominal aortic aneurysm repair  2009    EVAR    There were no vitals filed for this visit.       Subjective Assessment - 04/13/16 1314    Subjective Pt reports he feels like "I'm slowing down" from where he was when he completed therapy last year. Notes a "tired, heavy" feeling in his legs from the hips down. Pt not able to participate in the activities he normally  enjoys such as walking on the treadmill, doing his exercises and working in the garden.   Limitations Standing;Walking   How long can you stand comfortably? < 5 minutes   How long can you walk comfortably? 3/4 mile on treadmill takes ~ 1 hr   Patient Stated Goals "To get rid of the heaviness in my legs and get better balance."   Currently in Pain? No/denies   Pain Score 0-No pain  Least 0/10 at rest sitting or lying, up to 8/10 when standing still or trying to walk   Pain Location Back  and B hips   Pain Descriptors / Indicators Heaviness;Pressure;Tingling   Pain Type Chronic pain   Pain Radiating Towards tingling down B legs to feet   Pain Onset 1 to 4 weeks ago   Pain Frequency Intermittent   Aggravating Factors  Standing is the worst   Pain Relieving Factors Walking helps decrease pain a little, sitting or lying down   Effect of Pain on Daily Activities Harder to walk on treadmill or complete exercises, Difficulty working in the garden            [facility] PT Assessment - 04/13/16 1310    Assessment   Medical Diagnosis Lumbar  spinal stenosis & Parkinson's disease   Referring Provider Basil Dess, MD   Onset Date/Surgical Date --  recent exacerbation for past 2-3 weeks   Next MD Visit ~6 months   Prior Therapy OP PT 06/2015-08/2015 for lumbar stenosis   Balance Screen   Has the patient fallen in the past 6 months No  Pt reports close calls   Has the patient had a decrease in activity level because of a fear of falling?  Yes   Is the patient reluctant to leave their home because of a fear of falling?  Yes   [place] Private residence   Living Arrangements Alone   Available Help at Discharge Family  Dtr [name]) lives ~3 miles away   Type of [place] to enter   Entrance Stairs-Number of Steps 1   Entrance Stairs-Rails None   Home Layout One level   [place] - single point;Grab bars - tub/shower;Grab bars -  toilet;Shower seat - built in   Prior Function   Level of Independence Needs assistance with homemaking;Independent with basic ADLs;Independent with household mobility without device;Independent with gait;Independent with transfers   Vocation Retired   Leisure Walking on treadmill, exercising, working in garden   Observation/Other Assessments   Focus on Therapeutic Outcomes (FOTO)  Neuromuscular disorder 53% (47% limitation); Predicted 57% (43% limitation)   ROM / Strength   AROM / PROM / Strength AROM;Strength   AROM   AROM Assessment Site Lumbar   Lumbar Flexion 60% with pain at end range   Lumbar Extension 30%   Lumbar - Right Side Bend 60%   Lumbar - Left Side Bend 60%   Lumbar - Right Rotation 30%   Lumbar - Left Rotation 40%   Strength   Strength Assessment Site Hip;Knee;Ankle   Right/Left Hip Right;Left   Right Hip Flexion 3+/5   Right Hip Extension 3+/5   Right Hip ABduction 4/5   Right Hip ADduction 3+/5   Left Hip Flexion 3+/5   Left Hip Extension 3+/5   Left Hip ABduction 4/5   Left Hip ADduction 3+/5   Right/Left Knee Right;Left   Right Knee Flexion 4/5   Right Knee Extension 4/5   Left Knee Flexion 4/5   Left Knee Extension 4/5   Right/Left Ankle Right;Left   Right Ankle Dorsiflexion 4/5   Right Ankle Plantar Flexion 4-/5   Left Ankle Dorsiflexion 4/5   Left Ankle Plantar Flexion 4-/5   Flexibility   Soft Tissue Assessment /Muscle Length yes   Hamstrings excessive hamstring tightness bilaterally   Transfers   Transfers Sit to Stand;Stand to Sit;Stand Pivot Transfers   Sit to Stand 6: Modified independent (Device/Increase time);With upper extremity assist   Stand to Sit 6: Modified independent (Device/Increase time);With upper extremity assist   Stand Pivot Transfers 6: Modified independent (Device/Increase time)   Ambulation/Gait   Assistive device None   Gait Pattern Step-through pattern;Decreased arm swing - right;Decreased arm swing - left;Decreased  step length - right;Decreased step length - left;Decreased stride length;Decreased hip/knee flexion - right;Decreased hip/knee flexion - left;Decreased trunk rotation;Trunk flexed;Poor foot clearance - left;Poor foot clearance - right   Balance   Balance Assessed Yes   Standardized Balance Assessment   Standardized Balance Assessment Berg Balance Test;Timed Up and Go Test;10 meter walk test   10 Meter Walk 0.50 m/s or 1.64 ft/sec at preferred walking speed  20.13 sec   [employer]   Sit  to Stand Able to stand  independently using hands   Standing Unsupported Able to stand safely 2 minutes   Sitting with Back Unsupported but Feet Supported on Floor or Stool Able to sit safely and securely 2 minutes   Stand to Sit Controls descent by using hands   Transfers Able to transfer safely, minor use of hands   Standing Unsupported with Eyes Closed Able to stand 3 seconds   Standing Ubsupported with Feet Together Able to place feet together independently and stand for 1 minute with supervision   From Standing, Reach Forward with Outstretched Arm Can reach forward >12 cm safely (5")   From Standing Position, Pick up Object from Floor Able to pick up shoe, needs supervision   From Standing Position, Turn to Look Behind Over each Shoulder Turn sideways only but maintains balance   Turn 360 Degrees Able to turn 360 degrees safely but slowly  8" to L, 6" to R   Standing Unsupported, Alternately Place Feet on Step/Stool Able to stand independently and complete 8 steps >20 seconds   Standing Unsupported, One Foot in Front Able to take small step independently and hold 30 seconds   Standing on One Leg Tries to lift leg/unable to hold 3 seconds but remains standing independently   Total Score 39   Berg comment: 37-45 significant risk for fall (>80%)    Timed Up and Go Test   Normal TUG (seconds) 15.62   TUG Comments >13.5 sec indicates high fall risk               PT Short Term Goals -  04/13/16 2012    PT SHORT TERM GOAL #1   Title Pt will be independent with lumbar HEP by 05/11/16   Status New           PT Long Term Goals - 04/13/16 2014    PT LONG TERM GOAL #1   Title Pt will be independent with advance lumbar +/- balance HEP by 06/08/16   Status New   PT LONG TERM GOAL #2   Title Pt will improve bilateral LE strength to 4/5 or greater by 06/08/16   Status New   PT LONG TERM GOAL #3   Title Pt will demonstrate improvement on the Berg Balance Scale to 49/56 or greater to reduce risk for falls by 06/08/16   Status New   PT LONG TERM GOAL #4   Title Pt will improve gait speed to 2.0 ft/sec or greater for improved safety with ambulation by 06/08/16   Status New               Plan - 04/13/16 2003    Clinical Impression Statement Mr. Rosenstein is an 82 y/o male with lumbar spinal stenosis and Parkinson's disease who is returning to PT for worsening of balance and coordination along with decreasing activity tolerance secondary to increasing sense of heaviness and tired feelings in his legs from lumbar stenosis. Pt completed prior episode of PT for lumbar stenosis from July to Sept 2016. Since this time, he has demonstrated a decline in LE strength especially in the hips (currently 3+/5 except hip ABD 4/5, and previously 4/5 except hip ABD 4+/5) and a decline in balance per the Berg to 39/56 from 51/56 indicating a significant risk (>80%) for falls. Due to this weakness, feeling of LE heaviness and lack of balance, pt reports not able to participate in the activities he normally enjoys such as walking on the treadmill, doing  his exercises and working in the garden.   Clinical Impairments Affecting Rehab Potential Advanced age, HTN, COPD   PT Frequency 2x / week   PT Duration 8 weeks   PT Treatment/Interventions Patient/family education;Therapeutic exercise;Therapeutic activities;Neuromuscular re-education;Balance training;Functional mobility training;Gait training;Manual  techniques;ADLs/Self Care Home Management   PT Next Visit Plan NuStep or bike for increased activity tolerance; Create HEP for lumbar stenosis (flexion based); Balance/coordination training   Consulted and Agree with Plan of Care Patient;Family member/caregiver   Family Member Consulted Daughter - [name]      Patient will benefit from skilled therapeutic intervention in order to improve the following deficits and impairments:  Difficulty walking, Abnormal gait, Decreased balance, Decreased coordination, Decreased strength, Impaired flexibility, Decreased mobility, Decreased activity tolerance, Decreased endurance, Pain  Visit Diagnosis: Unsteadiness on feet - Plan: PT plan of care cert/re-cert  Unspecified lack of coordination - Plan: PT plan of care cert/re-cert  Difficulty in walking, not elsewhere classified - Plan: PT plan of care cert/re-cert  Other abnormalities of gait and mobility - Plan: PT plan of care cert/re-cert  Muscle weakness (generalized) - Plan: PT plan of care cert/re-cert      G-Codes - [id number] 04/04/25    Functional Assessment Tool Used Merrilee Jansky Balance Scale = 39/56 (30% limitation) + clinical judgement based on cormorbidities of lumbar stenosis & Parkinson's (Neuromuscular disorder FOTO = 53% (47% limitation))   Functional Limitation Mobility: Walking and moving around   Mobility: Walking and Moving Around Current Status [id number]) At least 40 percent but less than 60 percent impaired, limited or restricted   Mobility: Walking and Moving Around Goal Status [phone]) At least 20 percent but less than 40 percent impaired, limited or restricted       Problem List Patient Active Problem List   Diagnosis Date Noted  . Follow-up --- PCP NOTES 09/02/2015  . Neuropathy ([place]) 06/10/2015  . Pedal edema 01/30/2015  . Hip pain, chronic 01/30/2015  . Back pain 01/30/2015  . Cramps of lower extremity 01/30/2015  . Aftercare following surgery of the circulatory system, [place]  08/05/2014  . COPD exacerbation ([place]) 01/10/2014  . Annual physical exam 10/19/2011  . DJD (degenerative joint disease) 12/14/2010  . Anemia 06/06/2009  . COPD (chronic obstructive pulmonary disease) ([place]) 06/06/2009  . RENAL CELL CANCER 12/25/2008  . HTN (hypertension) 12/25/2008  . AAA (abdominal aortic aneurysm) without rupture ([place]) 12/25/2008  . CKD (chronic kidney disease) 12/25/2008  . HYPERCHOLESTEROLEMIA 03/01/2008  . GERD 03/01/2008  . ACNE ROSACEA 11/29/2007  . DIVERTICULOSIS, COLON 04/12/2007    Percival Spanish, PT, MPT 04/13/2016, 8:36 PM  [facility] [address]  [place], [place], [address] Phone: [phone]   Fax:  [phone]  Name: DENARIUS SZEKELY MRN: [id number] Date of Birth: October 09, 1927

## 2016-04-15 ENCOUNTER — Ambulatory Visit: Payer: Medicare Other

## 2016-04-15 DIAGNOSIS — R2689 Other abnormalities of gait and mobility: Secondary | ICD-10-CM

## 2016-04-15 DIAGNOSIS — R279 Unspecified lack of coordination: Secondary | ICD-10-CM | POA: Diagnosis not present

## 2016-04-15 DIAGNOSIS — R262 Difficulty in walking, not elsewhere classified: Secondary | ICD-10-CM

## 2016-04-15 DIAGNOSIS — M48061 Spinal stenosis, lumbar region without neurogenic claudication: Secondary | ICD-10-CM

## 2016-04-15 DIAGNOSIS — R2681 Unsteadiness on feet: Secondary | ICD-10-CM | POA: Diagnosis not present

## 2016-04-15 DIAGNOSIS — M6281 Muscle weakness (generalized): Secondary | ICD-10-CM | POA: Diagnosis not present

## 2016-04-15 DIAGNOSIS — R269 Unspecified abnormalities of gait and mobility: Secondary | ICD-10-CM | POA: Diagnosis not present

## 2016-04-15 NOTE — Therapy (Signed)
[facility] [address]  [place], [place], [address] Phone: [phone]   Fax:  [phone]  Physical Therapy Treatment  Patient Details  Name: [name] MRN: [id number] Date of Birth: [birth year] Referring Provider: Basil Dess, MD  Encounter Date: 04/15/2016      PT End of Session - 04/15/16 1835    Visit Number 2   Number of Visits 16   Date for PT Re-Evaluation 06/08/16   PT Start Time [id number]   PT Stop Time 1615   PT Time Calculation (min) 40 min   Activity Tolerance Patient tolerated treatment well   Behavior During Therapy [facility] for tasks assessed/performed      Past Medical History  Diagnosis Date  . Abdominal aortic aneurysm ([place]) 09/2008    s/o endovascular repair and angiogram 10/04/08   . Bleeding ulcer 1997  . Leukopenia 2/11    thought to be from doxy  . Renal cell carcinoma     clear cell type dx 10/09, s/p excision 10-24-08  . Hypertension   . Renal insufficiency   . COPD (chronic obstructive pulmonary disease) ([place])   . Anemia     EGD Cscope 08-2012  . CKD (chronic kidney disease), stage IV (HCC)     Stable, Dr. Dimas Aguas  . Solitary left kidney     W/ chronic tubule-interstitial damage, Dr. Dimas Aguas  . Spinal stenosis     causing LE weakness-persistant, being worked up by Hydrographic surveyor and neurologist  . H/O hyperkalemia     Stable    Past Surgical History  Procedure Laterality Date  . Mohs surgery      1997 (lip), 2004  . Hemorrhoid surgery  1990  . Nephrectomy  2009    Open nephrectomy for cancer  . Abdominal aortic aneurysm repair  2009    EVAR    There were no vitals filed for this visit.      Subjective Assessment - 04/15/16 1542    Subjective Pt. reports "legs giving out" while taking the trash out to the road last week however able to recover without a fall.     Patient Stated Goals "To get rid of the heaviness in my legs and get better balance."   Currently  in Pain? No/denies   Pain Score 0-No pain   Multiple Pain Sites No      Today's treatment:  TherEx: NuStep: level 4, 4 min PWR! Transition sit <> stand 3 x 5 reps PWR! Trunk rotation x 10 reps Seated retraction with blue TB 3 x 10 reps Alternating toe-touch on 6" step x 10 reps  Alternating toe-touch on 8" step x 10 reps  Step on on 6" step x 5 reps; pt. very shaky with these with weakened B quads        PT Short Term Goals - 04/15/16 1835    PT SHORT TERM GOAL #1   Title Pt will be independent with lumbar HEP by 05/11/16   Status On-going           PT Long Term Goals - 04/15/16 1835    PT LONG TERM GOAL #1   Title Pt will be independent with advance lumbar +/- balance HEP by 06/08/16   Status On-going   PT LONG TERM GOAL #2   Title Pt will improve bilateral LE strength to 4/5 or greater by 06/08/16   Status On-going   PT LONG TERM GOAL #3   Title Pt will  demonstrate improvement on the Berg Balance Scale to 49/56 or greater to reduce risk for falls by 06/08/16   Status On-going   PT LONG TERM GOAL #4   Title Pt will improve gait speed to 2.0 ft/sec or greater for improved safety with ambulation by 06/08/16   Status On-going               Plan - 04/15/16 1836    Clinical Impression Statement Pt. performed well with conservative and functional strengthening activity today however required increased time for activities secondary to being HOH; line of sight required with all therex for pt. instruction to be effective.  Pt. continues to be very cooperative with PT willing to attemp all activities.     PT Treatment/Interventions Patient/family education;Therapeutic exercise;Therapeutic activities;Neuromuscular re-education;Balance training;Functional mobility training;Gait training;Manual techniques;ADLs/Self Care Home Management   PT Next Visit Plan NuStep or bike for increased activity tolerance; Create HEP for lumbar stenosis flexion based); Balance/coordination  training      Patient will benefit from skilled therapeutic intervention in order to improve the following deficits and impairments:  Difficulty walking, Abnormal gait, Decreased balance, Decreased coordination, Decreased strength, Impaired flexibility, Decreased mobility, Decreased activity tolerance, Decreased endurance, Pain  Visit Diagnosis: Unsteadiness on feet  Unspecified lack of coordination  Difficulty in walking, not elsewhere classified  Other abnormalities of gait and mobility  Muscle weakness (generalized)  Abnormality of gait  Lack of coordination  Difficulty walking  Spinal stenosis of lumbar region     Problem List Patient Active Problem List   Diagnosis Date Noted  . Follow-up --- PCP NOTES 09/02/2015  . Neuropathy ([place]) 06/10/2015  . Pedal edema 01/30/2015  . Hip pain, chronic 01/30/2015  . Back pain 01/30/2015  . Cramps of lower extremity 01/30/2015  . Aftercare following surgery of the circulatory system, [place] 08/05/2014  . COPD exacerbation ([place]) 01/10/2014  . Annual physical exam 10/19/2011  . DJD (degenerative joint disease) 12/14/2010  . Anemia 06/06/2009  . COPD (chronic obstructive pulmonary disease) ([place]) 06/06/2009  . RENAL CELL CANCER 12/25/2008  . HTN (hypertension) 12/25/2008  . AAA (abdominal aortic aneurysm) without rupture ([place]) 12/25/2008  . CKD (chronic kidney disease) 12/25/2008  . HYPERCHOLESTEROLEMIA 03/01/2008  . GERD 03/01/2008  . ACNE ROSACEA 11/29/2007  . DIVERTICULOSIS, COLON 04/12/2007    Bess Harvest, PTA 04/16/2016, 12:51 PM  [facility] [address]  [place], [place], [address] Phone: [phone]   Fax:  [phone]  Name: VATCHE BERMAN MRN: [id number] Date of Birth: 1927/01/12

## 2016-04-20 ENCOUNTER — Ambulatory Visit: Payer: Medicare Other | Admitting: Physical Therapy

## 2016-04-20 DIAGNOSIS — R2689 Other abnormalities of gait and mobility: Secondary | ICD-10-CM | POA: Diagnosis not present

## 2016-04-20 DIAGNOSIS — R279 Unspecified lack of coordination: Secondary | ICD-10-CM | POA: Diagnosis not present

## 2016-04-20 DIAGNOSIS — R2681 Unsteadiness on feet: Secondary | ICD-10-CM | POA: Diagnosis not present

## 2016-04-20 DIAGNOSIS — R262 Difficulty in walking, not elsewhere classified: Secondary | ICD-10-CM

## 2016-04-20 DIAGNOSIS — M6281 Muscle weakness (generalized): Secondary | ICD-10-CM

## 2016-04-20 DIAGNOSIS — R269 Unspecified abnormalities of gait and mobility: Secondary | ICD-10-CM | POA: Diagnosis not present

## 2016-04-20 NOTE — Therapy (Signed)
[facility] [address]  [place], [place], [address] Phone: [phone]   Fax:  [phone]  Physical Therapy Treatment  Patient Details  Name: [name] MRN: [id number] Date of Birth: 16-Apr-1927 Referring Provider: Basil Dess, MD  Encounter Date: 04/20/2016      PT End of Session - 04/20/16 0806    Visit Number 3   Number of Visits 16   Date for PT Re-Evaluation 06/08/16   PT Start Time 0800   PT Stop Time 0842   PT Time Calculation (min) 42 min   Activity Tolerance Patient tolerated treatment well   Behavior During Therapy [facility] for tasks assessed/performed      Past Medical History  Diagnosis Date  . Abdominal aortic aneurysm ([place]) 09/2008    s/o endovascular repair and angiogram 10/04/08   . Bleeding ulcer 1997  . Leukopenia 2/11    thought to be from doxy  . Renal cell carcinoma     clear cell type dx 10/09, s/p excision 10-24-08  . Hypertension   . Renal insufficiency   . COPD (chronic obstructive pulmonary disease) ([place])   . Anemia     EGD Cscope 08-2012  . CKD (chronic kidney disease), stage IV (HCC)     Stable, Dr. Dimas Aguas  . Solitary left kidney     W/ chronic tubule-interstitial damage, Dr. Dimas Aguas  . Spinal stenosis     causing LE weakness-persistant, being worked up by Hydrographic surveyor and neurologist  . H/O hyperkalemia     Stable    Past Surgical History  Procedure Laterality Date  . Mohs surgery      1997 (lip), 2004  . Hemorrhoid surgery  1990  . Nephrectomy  2009    Open nephrectomy for cancer  . Abdominal aortic aneurysm repair  2009    EVAR    There were no vitals filed for this visit.      Subjective Assessment - 04/20/16 0805    Subjective Pt states back is more "tired" than painful today.   Currently in Pain? No/denies         Today's Treatment  TherEx Treadmill - 1.0 mph x 4' B Hamstring stretch with strap 2x20" each B SKTC stretch 2x20"  B  Piriformis stretch 2x20" LTR 10x5" DKTC with feet on peanut ball 10x3" Abdominal bracing + Pelvic tilt 10x3" TrA + Hooklying Hip ABD/ER fall-out 10x3" TrA + Hooklying March 10x3" Bridge 10x3'          PT Education - 04/20/16 [id number]    Education provided Yes   Education Details Low back HEP   Person(s) Educated Patient;Child(ren)   Methods Explanation;Demonstration;Handout;Tactile cues;Verbal cues   Comprehension Verbalized understanding;Returned demonstration;Verbal cues required;Tactile cues required;Need further instruction          PT Short Term Goals - 04/20/16 0850    PT SHORT TERM GOAL #1   Title Pt will be independent with lumbar HEP by 05/11/16   Status On-going           PT Long Term Goals - 04/20/16 0850    PT LONG TERM GOAL #1   Title Pt will be independent with advance lumbar +/- balance HEP by 06/08/16   Status On-going   PT LONG TERM GOAL #2   Title Pt will improve bilateral LE strength to 4/5 or greater by 06/08/16   Status On-going   PT LONG TERM GOAL #3   Title Pt will demonstrate improvement on  the Berg Balance Scale to 49/56 or greater to reduce risk for falls by 06/08/16   Status On-going   PT LONG TERM GOAL #4   Title Pt will improve gait speed to 2.0 ft/sec or greater for improved safety with ambulation by 06/08/16   Status On-going               Plan - 04/20/16 0844    Clinical Impression Statement Provided instruction in low back HEP for lumbar stenosis inlcuding stretching and core strengthening/stabilization exercises with flexion bias. Pt required frequent cues for most exercises and will require further review to ensure proper performance at home. Pt continues to complete HEP exercises from prior PT episode last year, therefore instructed pt and dtr for pt to complete new and old exercises on alternating days.   PT Treatment/Interventions Patient/family education;Therapeutic exercise;Therapeutic activities;Neuromuscular  re-education;Balance training;Functional mobility training;Gait training;Manual techniques;ADLs/Self Care Home Management   PT Next Visit Plan NuStep, bike or treadmill for increased activity tolerance; Review HEP for lumbar stenosis; Balance/coordination training   PT Home Exercise Plan Low back HEP alternating days with existing HEP from prior therapy episode   Consulted and Agree with Plan of Care Patient;Family member/caregiver   Family Member Consulted Daughter - [name]      Patient will benefit from skilled therapeutic intervention in order to improve the following deficits and impairments:  Difficulty walking, Abnormal gait, Decreased balance, Decreased coordination, Decreased strength, Impaired flexibility, Decreased mobility, Decreased activity tolerance, Decreased endurance, Pain  Visit Diagnosis: Unsteadiness on feet  Unspecified lack of coordination  Difficulty in walking, not elsewhere classified  Other abnormalities of gait and mobility  Muscle weakness (generalized)     Problem List Patient Active Problem List   Diagnosis Date Noted  . Follow-up --- PCP NOTES 09/02/2015  . Neuropathy ([place]) 06/10/2015  . Pedal edema 01/30/2015  . Hip pain, chronic 01/30/2015  . Back pain 01/30/2015  . Cramps of lower extremity 01/30/2015  . Aftercare following surgery of the circulatory system, [place] 08/05/2014  . COPD exacerbation ([place]) 01/10/2014  . Annual physical exam 10/19/2011  . DJD (degenerative joint disease) 12/14/2010  . Anemia 06/06/2009  . COPD (chronic obstructive pulmonary disease) ([place]) 06/06/2009  . RENAL CELL CANCER 12/25/2008  . HTN (hypertension) 12/25/2008  . AAA (abdominal aortic aneurysm) without rupture ([place]) 12/25/2008  . CKD (chronic kidney disease) 12/25/2008  . HYPERCHOLESTEROLEMIA 03/01/2008  . GERD 03/01/2008  . ACNE ROSACEA 11/29/2007  . DIVERTICULOSIS, COLON 04/12/2007    Percival Spanish, PT, MPT 04/20/2016, 8:55 AM  [facility] [address]  [place], [place], [address] Phone: [phone]   Fax:  [phone]  Name: [facility] MRN: [id number] Date of Birth: Feb 17, 1927

## 2016-04-22 ENCOUNTER — Ambulatory Visit: Payer: Medicare Other | Admitting: Physical Therapy

## 2016-04-22 DIAGNOSIS — M6281 Muscle weakness (generalized): Secondary | ICD-10-CM

## 2016-04-22 DIAGNOSIS — R2689 Other abnormalities of gait and mobility: Secondary | ICD-10-CM

## 2016-04-22 DIAGNOSIS — R2681 Unsteadiness on feet: Secondary | ICD-10-CM | POA: Diagnosis not present

## 2016-04-22 DIAGNOSIS — R279 Unspecified lack of coordination: Secondary | ICD-10-CM

## 2016-04-22 DIAGNOSIS — R269 Unspecified abnormalities of gait and mobility: Secondary | ICD-10-CM | POA: Diagnosis not present

## 2016-04-22 DIAGNOSIS — R262 Difficulty in walking, not elsewhere classified: Secondary | ICD-10-CM | POA: Diagnosis not present

## 2016-04-22 NOTE — Therapy (Signed)
[facility] [address]  [place], [place], [address] Phone: [phone]   Fax:  [phone]  Physical Therapy Treatment  Patient Details  Name: [name] MRN: [id number] Date of Birth: Apr 28, 1927 Referring Provider: Basil Dess, MD  Encounter Date: 04/22/2016      PT End of Session - 04/22/16 0804    Visit Number 4   Number of Visits 16   Date for PT Re-Evaluation 06/08/16   PT Start Time 0800   PT Stop Time 0844   PT Time Calculation (min) 44 min   Activity Tolerance Patient tolerated treatment well   Behavior During Therapy [facility] for tasks assessed/performed      Past Medical History  Diagnosis Date  . Abdominal aortic aneurysm ([place]) 09/2008    s/o endovascular repair and angiogram 10/04/08   . Bleeding ulcer 1997  . Leukopenia 2/11    thought to be from doxy  . Renal cell carcinoma     clear cell type dx 10/09, s/p excision 10-24-08  . Hypertension   . Renal insufficiency   . COPD (chronic obstructive pulmonary disease) ([place])   . Anemia     EGD Cscope 08-2012  . CKD (chronic kidney disease), stage IV (HCC)     Stable, Dr. Dimas Aguas  . Solitary left kidney     W/ chronic tubule-interstitial damage, Dr. Dimas Aguas  . Spinal stenosis     causing LE weakness-persistant, being worked up by Hydrographic surveyor and neurologist  . H/O hyperkalemia     Stable    Past Surgical History  Procedure Laterality Date  . Mohs surgery      1997 (lip), 2004  . Hemorrhoid surgery  1990  . Nephrectomy  2009    Open nephrectomy for cancer  . Abdominal aortic aneurysm repair  2009    EVAR    There were no vitals filed for this visit.      Subjective Assessment - 04/22/16 0803    Subjective Pt reporting he was able to complete HEP without problems.   Currently in Pain? No/denies           Today's Treatment  TherEx Treadmill - 1.0 mph x 5' B Hamstring stretch with strap x20" B SKTC stretch x20"   B Piriformis stretch x20"  Manual B Hamstring stretch 2x20" each B Piriformis stretch 2x20"  TherEx LTR 20x5" DKTC with feet on peanut ball 10x3" Abdominal bracing + Pelvic tilt 10x3" TrA + Hooklying Hip ABD/ER fall-out 10x3" TrA + Hooklying March 10x3" Bridge 10x3' Standing at back of chair:   Heel raises x10 Standing on blue foam Airex pad at back of chair:   Alternating Hip ABD x10   Alternating Hip Ext x10   Marching in place x10          PT Short Term Goals - 04/20/16 0850    PT SHORT TERM GOAL #1   Title Pt will be independent with lumbar HEP by 05/11/16   Status On-going           PT Long Term Goals - 04/20/16 0850    PT LONG TERM GOAL #1   Title Pt will be independent with advance lumbar +/- balance HEP by 06/08/16   Status On-going   PT LONG TERM GOAL #2   Title Pt will improve bilateral LE strength to 4/5 or greater by 06/08/16   Status On-going   PT LONG TERM GOAL #3   Title Pt will  demonstrate improvement on the Berg Balance Scale to 49/56 or greater to reduce risk for falls by 06/08/16   Status On-going   PT LONG TERM GOAL #4   Title Pt will improve gait speed to 2.0 ft/sec or greater for improved safety with ambulation by 06/08/16   Status On-going               Plan - 04/22/16 0833    Clinical Impression Statement Reviewed low back HEP with pt requiring occasional repeat insrtuctions with some exercises, and cues for pacing/hold times with nearly all exercises. Incorporated blue foam Airex with standing exercises to promote improved proprioception and balance with pt requring UE support on back of chair.   PT Treatment/Interventions Patient/family education;Therapeutic exercise;Therapeutic activities;Neuromuscular re-education;Balance training;Functional mobility training;Gait training;Manual techniques;ADLs/Self Care Home Management   PT Next Visit Plan NuStep, bike or treadmill for increased activity tolerance; Review HEP for lumbar  stenosis + add resistance PRN; Balance/coordination training   Consulted and Agree with Plan of Care Patient      Patient will benefit from skilled therapeutic intervention in order to improve the following deficits and impairments:  Difficulty walking, Abnormal gait, Decreased balance, Decreased coordination, Decreased strength, Impaired flexibility, Decreased mobility, Decreased activity tolerance, Decreased endurance, Pain  Visit Diagnosis: Unsteadiness on feet  Unspecified lack of coordination  Difficulty in walking, not elsewhere classified  Other abnormalities of gait and mobility  Muscle weakness (generalized)     Problem List Patient Active Problem List   Diagnosis Date Noted  . Follow-up --- PCP NOTES 09/02/2015  . Neuropathy ([place]) 06/10/2015  . Pedal edema 01/30/2015  . Hip pain, chronic 01/30/2015  . Back pain 01/30/2015  . Cramps of lower extremity 01/30/2015  . Aftercare following surgery of the circulatory system, [place] 08/05/2014  . COPD exacerbation ([place]) 01/10/2014  . Annual physical exam 10/19/2011  . DJD (degenerative joint disease) 12/14/2010  . Anemia 06/06/2009  . COPD (chronic obstructive pulmonary disease) ([place]) 06/06/2009  . RENAL CELL CANCER 12/25/2008  . HTN (hypertension) 12/25/2008  . AAA (abdominal aortic aneurysm) without rupture ([place]) 12/25/2008  . CKD (chronic kidney disease) 12/25/2008  . HYPERCHOLESTEROLEMIA 03/01/2008  . GERD 03/01/2008  . ACNE ROSACEA 11/29/2007  . DIVERTICULOSIS, COLON 04/12/2007    Percival Spanish, PT, MPT 04/22/2016, 9:29 AM  [facility] [place], [place], [address] Phone: [phone]   Fax:  [phone]  Name: EGAN HENDLER MRN: [id number] Date of Birth: [birth year]

## 2016-04-28 ENCOUNTER — Ambulatory Visit: Payer: Medicare Other | Attending: Specialist

## 2016-04-28 DIAGNOSIS — R262 Difficulty in walking, not elsewhere classified: Secondary | ICD-10-CM | POA: Diagnosis not present

## 2016-04-28 DIAGNOSIS — R2681 Unsteadiness on feet: Secondary | ICD-10-CM | POA: Diagnosis not present

## 2016-04-28 DIAGNOSIS — R279 Unspecified lack of coordination: Secondary | ICD-10-CM | POA: Diagnosis not present

## 2016-04-28 DIAGNOSIS — M6281 Muscle weakness (generalized): Secondary | ICD-10-CM | POA: Insufficient documentation

## 2016-04-28 DIAGNOSIS — R2689 Other abnormalities of gait and mobility: Secondary | ICD-10-CM | POA: Diagnosis not present

## 2016-04-28 NOTE — Therapy (Signed)
[facility] [address]  [place], [place], [address] Phone: [phone]   Fax:  [phone]  Physical Therapy Treatment  Patient Details  Name: [name] MRN: [id number] Date of Birth: July 02, 1927 Referring Provider: Basil Dess, MD  Encounter Date: 04/28/2016      PT End of Session - 04/28/16 0856    Visit Number 5   Number of Visits 16   Date for PT Re-Evaluation 06/08/16   PT Start Time 0851   PT Stop Time 0930   PT Time Calculation (min) 39 min   Activity Tolerance Patient tolerated treatment well   Behavior During Therapy [facility] for tasks assessed/performed      Past Medical History  Diagnosis Date  . Abdominal aortic aneurysm ([place]) 09/2008    s/o endovascular repair and angiogram 10/04/08   . Bleeding ulcer 1997  . Leukopenia 2/11    thought to be from doxy  . Renal cell carcinoma     clear cell type dx 10/09, s/p excision 10-24-08  . Hypertension   . Renal insufficiency   . COPD (chronic obstructive pulmonary disease) ([place])   . Anemia     EGD Cscope 08-2012  . CKD (chronic kidney disease), stage IV (HCC)     Stable, Dr. Dimas Aguas  . Solitary left kidney     W/ chronic tubule-interstitial damage, Dr. Dimas Aguas  . Spinal stenosis     causing LE weakness-persistant, being worked up by Hydrographic surveyor and neurologist  . H/O hyperkalemia     Stable    Past Surgical History  Procedure Laterality Date  . Mohs surgery      1997 (lip), 2004  . Hemorrhoid surgery  1990  . Nephrectomy  2009    Open nephrectomy for cancer  . Abdominal aortic aneurysm repair  2009    EVAR    There were no vitals filed for this visit.      Subjective Assessment - 04/28/16 0855    Subjective Pt. reports he is pain free today however states, "my legs keep giving out on me."; pt. denies falling since beginning of PT.     Patient Stated Goals "To get rid of the heaviness in my legs and get better balance."    Currently in Pain? No/denies   Pain Score 0-No pain   Multiple Pain Sites No      Today's Treatment  TherEx:  NuStep: level 3, 4 min  Treadmill - 1.0 mph x 3' B Hamstring stretch with strap x20" B SKTC stretch x20"   TherEx: TrA + Hooklying March with green TB around knees 10x3" B sidelying clam shell with green TB x 10 reps each  Bridge 10x3' Hooklying B hip abd/ER with green TB around knees x 10 reps HS with heels on peanut p-ball x 10 reps Bridge with heels on peanut p-ball x 10 reps Standing holding onto rolling table with therapist bracing table (for improved posture)      Heel raises 2 x10 Alternating stepping to 8" step standing on blue foam pad 2 x 10 reps each Alternating cone touch with red cone on white bolster and staggered stance on 2 blue foam pad x 10 each arm; cone location moved from R>L         PT Short Term Goals - 04/20/16 0850    PT SHORT TERM GOAL #1   Title Pt will be independent with lumbar HEP by 05/11/16   Status On-going  PT Long Term Goals - 04/20/16 0850    PT LONG TERM GOAL #1   Title Pt will be independent with advance lumbar +/- balance HEP by 06/08/16   Status On-going   PT LONG TERM GOAL #2   Title Pt will improve bilateral LE strength to 4/5 or greater by 06/08/16   Status On-going   PT LONG TERM GOAL #3   Title Pt will demonstrate improvement on the Berg Balance Scale to 49/56 or greater to reduce risk for falls by 06/08/16   Status On-going   PT LONG TERM GOAL #4   Title Pt will improve gait speed to 2.0 ft/sec or greater for improved safety with ambulation by 06/08/16   Status On-going               Plan - 04/28/16 0935    Clinical Impression Statement Pt. tolerated all stepping and balance activity well with balance activity progressed to staggered stance reaching activity on airex pads today.  Pt. compliant and willing to try all therex activities; close CGA provided from therapist with all standing activity  secondary to poor balance and weakened B quads; pt. reports his legs"buckle" under him frequently.     PT Treatment/Interventions Patient/family education;Therapeutic exercise;Therapeutic activities;Neuromuscular re-education;Balance training;Functional mobility training;Gait training;Manual techniques;ADLs/Self Care Home Management   PT Next Visit Plan NuStep, bike or treadmill for increased activity tolerance; Review HEP for lumbar stenosis + add resistance PRN; Balance/coordination training      Patient will benefit from skilled therapeutic intervention in order to improve the following deficits and impairments:  Difficulty walking, Abnormal gait, Decreased balance, Decreased coordination, Decreased strength, Impaired flexibility, Decreased mobility, Decreased activity tolerance, Decreased endurance, Pain  Visit Diagnosis: Unsteadiness on feet  Unspecified lack of coordination  Difficulty in walking, not elsewhere classified  Other abnormalities of gait and mobility  Muscle weakness (generalized)     Problem List Patient Active Problem List   Diagnosis Date Noted  . Follow-up --- PCP NOTES 09/02/2015  . Neuropathy ([place]) 06/10/2015  . Pedal edema 01/30/2015  . Hip pain, chronic 01/30/2015  . Back pain 01/30/2015  . Cramps of lower extremity 01/30/2015  . Aftercare following surgery of the circulatory system, [place] 08/05/2014  . COPD exacerbation ([place]) 01/10/2014  . Annual physical exam 10/19/2011  . DJD (degenerative joint disease) 12/14/2010  . Anemia 06/06/2009  . COPD (chronic obstructive pulmonary disease) ([place]) 06/06/2009  . RENAL CELL CANCER 12/25/2008  . HTN (hypertension) 12/25/2008  . AAA (abdominal aortic aneurysm) without rupture ([place]) 12/25/2008  . CKD (chronic kidney disease) 12/25/2008  . HYPERCHOLESTEROLEMIA 03/01/2008  . GERD 03/01/2008  . ACNE ROSACEA 11/29/2007  . DIVERTICULOSIS, COLON 04/12/2007    Bess Harvest, PTA 04/28/2016, 2:25 PM  [facility] [address]  [place], [place], [address] Phone: [phone]   Fax:  [phone]  Name: JYRIN MORIARTY MRN: [id number] Date of Birth: [birth year]

## 2016-04-30 ENCOUNTER — Ambulatory Visit: Payer: Medicare Other | Admitting: Physical Therapy

## 2016-04-30 DIAGNOSIS — R2689 Other abnormalities of gait and mobility: Secondary | ICD-10-CM

## 2016-04-30 DIAGNOSIS — R262 Difficulty in walking, not elsewhere classified: Secondary | ICD-10-CM | POA: Diagnosis not present

## 2016-04-30 DIAGNOSIS — M6281 Muscle weakness (generalized): Secondary | ICD-10-CM | POA: Diagnosis not present

## 2016-04-30 DIAGNOSIS — R279 Unspecified lack of coordination: Secondary | ICD-10-CM | POA: Diagnosis not present

## 2016-04-30 DIAGNOSIS — R2681 Unsteadiness on feet: Secondary | ICD-10-CM

## 2016-04-30 NOTE — Therapy (Signed)
[facility] [address]  [place], [place], [address] Phone: [phone]   Fax:  [phone]  Physical Therapy Treatment  Patient Details  Name: [name] MRN: [id number] Date of Birth: 12/14/1927 Referring Provider: Dr. Louanne Skye   Encounter Date: 04/30/2016      PT End of Session - 04/30/16 0852    Visit Number 6   Number of Visits 16   Date for PT Re-Evaluation 06/08/16   PT Start Time 0848   PT Stop Time 0927   PT Time Calculation (min) 39 min   Activity Tolerance Patient tolerated treatment well      Past Medical History  Diagnosis Date  . Abdominal aortic aneurysm ([place]) 09/2008    s/o endovascular repair and angiogram 10/04/08   . Bleeding ulcer 1997  . Leukopenia 2/11    thought to be from doxy  . Renal cell carcinoma     clear cell type dx 10/09, s/p excision 10-24-08  . Hypertension   . Renal insufficiency   . COPD (chronic obstructive pulmonary disease) ([place])   . Anemia     EGD Cscope 08-2012  . CKD (chronic kidney disease), stage IV (HCC)     Stable, Dr. Dimas Aguas  . Solitary left kidney     W/ chronic tubule-interstitial damage, Dr. Dimas Aguas  . Spinal stenosis     causing LE weakness-persistant, being worked up by Hydrographic surveyor and neurologist  . H/O hyperkalemia     Stable    Past Surgical History  Procedure Laterality Date  . Mohs surgery      1997 (lip), 2004  . Hemorrhoid surgery  1990  . Nephrectomy  2009    Open nephrectomy for cancer  . Abdominal aortic aneurysm repair  2009    EVAR    There were no vitals filed for this visit.      Subjective Assessment - 04/30/16 0852    Subjective Pt reports no new changes since last visit.     Patient Stated Goals "To get rid of the heaviness in my legs and get better balance."   Currently in Pain? No/denies            [facility] PT Assessment - 04/30/16 0001    Assessment   Medical Diagnosis Lumbar spinal stenosis &  Parkinson's disease   Referring Provider Dr. Louanne Skye    Prior Therapy OP PT 06/2015-08/2015 for lumbar stenosis           [facility] Adult PT Treatment/Exercise - 04/30/16 0001    Exercises   Exercises Lumbar;Knee/Hip;Shoulder   Lumbar Exercises: Stretches   Passive Hamstring Stretch 2 reps;30 seconds   Single Knee to Chest Stretch 2 reps;30 seconds  each side   Lower Trunk Rotation 5 reps;10 seconds   Lumbar Exercises: Standing   Heel Raises 10 reps;1 second  holding on to chair   Functional Squats 10 reps  holding on to chair   Row Strengthening;Both   Theraband Level (Row) Level 2 (Red)   Shoulder Extension Strengthening;Both;10 reps;Theraband   Theraband Level (Shoulder Extension) Level 2 (Red)   Other Standing Lumbar Exercises Standing tandem stance x 30 sec each leg forward (CGA and light unilateral UE support) x 2 reps each; then tandem stance weight shifts with light UE support x 10 reps each side.   Standing balance with feet together x 30 sec (CGA)    Lumbar Exercises: Supine   Clam 10 reps  with green band at  thighs, 2 sets    Knee/Hip Exercises: Standing   Other Standing Knee Exercises Standing trunk/hip extension with upper back against wall with 3 sec hold x 8 reps   Other Standing Knee Exercises Standing toe taps to 8" step with light UE support x 10   Knee/Hip Exercises: Supine   Bridges 2 sets;10 reps   Shoulder Exercises: Standing   Other Standing Exercises reverse push ups (back against wall, to strengthen postural muscles) x 10 reps                   PT Short Term Goals - 04/20/16 0850    PT SHORT TERM GOAL #1   Title Pt will be independent with lumbar HEP by 05/11/16   Status On-going           PT Long Term Goals - 04/20/16 0850    PT LONG TERM GOAL #1   Title Pt will be independent with advance lumbar +/- balance HEP by 06/08/16   Status On-going   PT LONG TERM GOAL #2   Title Pt will improve bilateral LE strength to 4/5 or greater by 06/08/16    Status On-going   PT LONG TERM GOAL #3   Title Pt will demonstrate improvement on the Berg Balance Scale to 49/56 or greater to reduce risk for falls by 06/08/16   Status On-going   PT LONG TERM GOAL #4   Title Pt will improve gait speed to 2.0 ft/sec or greater for improved safety with ambulation by 06/08/16   Status On-going               Plan - 04/30/16 0929    Clinical Impression Statement Pt tolerated all exercises well, no loss of balance or production of symptoms.  Pt required CGA with standing balance activities.  Pt progressing towards goals.    PT Frequency 2x / week   PT Duration 8 weeks   PT Treatment/Interventions Patient/family education;Therapeutic exercise;Therapeutic activities;Neuromuscular re-education;Balance training;Functional mobility training;Gait training;Manual techniques;ADLs/Self Care Home Management   PT Next Visit Plan  Review HEP for lumbar stenosis + add resistance PRN; Balance/coordination training   Consulted and Agree with Plan of Care Patient      Patient will benefit from skilled therapeutic intervention in order to improve the following deficits and impairments:  Difficulty walking, Abnormal gait, Decreased balance, Decreased coordination, Decreased strength, Impaired flexibility, Decreased mobility, Decreased activity tolerance, Decreased endurance, Pain  Visit Diagnosis: Unsteadiness on feet  Unspecified lack of coordination  Difficulty in walking, not elsewhere classified  Other abnormalities of gait and mobility     Problem List Patient Active Problem List   Diagnosis Date Noted  . Follow-up --- PCP NOTES 09/02/2015  . Neuropathy ([place]) 06/10/2015  . Pedal edema 01/30/2015  . Hip pain, chronic 01/30/2015  . Back pain 01/30/2015  . Cramps of lower extremity 01/30/2015  . Aftercare following surgery of the circulatory system, [place] 08/05/2014  . COPD exacerbation ([place]) 01/10/2014  . Annual physical exam 10/19/2011  . DJD  (degenerative joint disease) 12/14/2010  . Anemia 06/06/2009  . COPD (chronic obstructive pulmonary disease) ([place]) 06/06/2009  . RENAL CELL CANCER 12/25/2008  . HTN (hypertension) 12/25/2008  . AAA (abdominal aortic aneurysm) without rupture ([place]) 12/25/2008  . CKD (chronic kidney disease) 12/25/2008  . HYPERCHOLESTEROLEMIA 03/01/2008  . GERD 03/01/2008  . ACNE ROSACEA 11/29/2007  . DIVERTICULOSIS, COLON 04/12/2007   Kerin Perna, PTA 04/30/2016 9:32 AM  [facility] [phone]  [address]  [place], [place], [address] Phone: [phone]   Fax:  [phone]  Name: [name] MRN: [id number] Date of Birth: 1927/01/29

## 2016-05-06 ENCOUNTER — Ambulatory Visit: Payer: Medicare Other

## 2016-05-07 ENCOUNTER — Ambulatory Visit: Payer: Medicare Other | Admitting: Physical Therapy

## 2016-05-07 DIAGNOSIS — R2689 Other abnormalities of gait and mobility: Secondary | ICD-10-CM | POA: Diagnosis not present

## 2016-05-07 DIAGNOSIS — R262 Difficulty in walking, not elsewhere classified: Secondary | ICD-10-CM | POA: Diagnosis not present

## 2016-05-07 DIAGNOSIS — R279 Unspecified lack of coordination: Secondary | ICD-10-CM | POA: Diagnosis not present

## 2016-05-07 DIAGNOSIS — M6281 Muscle weakness (generalized): Secondary | ICD-10-CM

## 2016-05-07 DIAGNOSIS — R2681 Unsteadiness on feet: Secondary | ICD-10-CM

## 2016-05-07 NOTE — Therapy (Signed)
[facility] [address]  [place], [place], [address] Phone: [phone]   Fax:  [phone]  Physical Therapy Treatment  Patient Details  Name: [name] MRN: [id number] Date of Birth: 12/21/1927 Referring Provider: Dr. Louanne Skye   Encounter Date: 05/07/2016      PT End of Session - 05/07/16 0850    Visit Number 7   Number of Visits 16   Date for PT Re-Evaluation 06/08/16   PT Start Time 0846   PT Stop Time 0928   PT Time Calculation (min) 42 min   Activity Tolerance Patient tolerated treatment well   Behavior During Therapy [facility] for tasks assessed/performed      Past Medical History  Diagnosis Date  . Abdominal aortic aneurysm ([place]) 09/2008    s/o endovascular repair and angiogram 10/04/08   . Bleeding ulcer 1997  . Leukopenia 2/11    thought to be from doxy  . Renal cell carcinoma     clear cell type dx 10/09, s/p excision 10-24-08  . Hypertension   . Renal insufficiency   . COPD (chronic obstructive pulmonary disease) ([place])   . Anemia     EGD Cscope 08-2012  . CKD (chronic kidney disease), stage IV (HCC)     Stable, Dr. Dimas Aguas  . Solitary left kidney     W/ chronic tubule-interstitial damage, Dr. Dimas Aguas  . Spinal stenosis     causing LE weakness-persistant, being worked up by Hydrographic surveyor and neurologist  . H/O hyperkalemia     Stable    Past Surgical History  Procedure Laterality Date  . Mohs surgery      1997 (lip), 2004  . Hemorrhoid surgery  1990  . Nephrectomy  2009    Open nephrectomy for cancer  . Abdominal aortic aneurysm repair  2009    EVAR    There were no vitals filed for this visit.      Subjective Assessment - 05/07/16 0849    Subjective Pt thinks therapy is helping. Back still gives him trouble from time to time but not hurting now.   Currently in Pain? No/denies           Today's Treatment  TherEx  Treadmill - 1.2 mph x 5' BATCA Low Row 15#  2x10 Standing trunk/hip extension with upper back against wall 10x3"  Standing B Scapular retraction + Shoulder extension to neutral with green TB 10x3" Staggered stance Single arm Row + Slight trunk rotation with double green TB 10x3" TRX partial squat x10 Sidestepping over 1/2 foam roll with TRX support x10 Standing in corner on blue foam Airex pad:   Marching in place x10   B SLS 2x10" (light UE support against wall)   B Staggered stance 2x20" (intermittent UE support)           PT Short Term Goals - 05/07/16 0929    PT SHORT TERM GOAL #1   Title Pt will be independent with lumbar HEP by 05/11/16   Status Achieved           PT Long Term Goals - 05/07/16 0929    PT LONG TERM GOAL #1   Title Pt will be independent with advance lumbar +/- balance HEP by 06/08/16   Status On-going   PT LONG TERM GOAL #2   Title Pt will improve bilateral LE strength to 4/5 or greater by 06/08/16   Status On-going   PT LONG TERM GOAL #3  Title Pt will demonstrate improvement on the Berg Balance Scale to 49/56 or greater to reduce risk for falls by 06/08/16   Status On-going   PT LONG TERM GOAL #4   Title Pt will improve gait speed to 2.0 ft/sec or greater for improved safety with ambulation by 06/08/16   Status On-going               Plan - 05/07/16 0928    Clinical Impression Statement Pt reporting no issues with lumbar HEP therefore progressed therapy activities to include more upright lumbar/postural strengthening and balance activities. Pt requiring more freqeuent seated rest beaks with progression to upright activities, but otherwise tolerated everything well.   PT Treatment/Interventions Patient/family education;Therapeutic exercise;Therapeutic activities;Neuromuscular re-education;Balance training;Functional mobility training;Gait training;Manual techniques;ADLs/Self Care Home Management   PT Next Visit Plan Balance/coordination training,  Lumbar flexibility/stabilization    Consulted and Agree with Plan of Care Patient      Patient will benefit from skilled therapeutic intervention in order to improve the following deficits and impairments:  Difficulty walking, Abnormal gait, Decreased balance, Decreased coordination, Decreased strength, Impaired flexibility, Decreased mobility, Decreased activity tolerance, Decreased endurance, Pain  Visit Diagnosis: Unsteadiness on feet  Unspecified lack of coordination  Difficulty in walking, not elsewhere classified  Other abnormalities of gait and mobility  Muscle weakness (generalized)     Problem List Patient Active Problem List   Diagnosis Date Noted  . Follow-up --- PCP NOTES 09/02/2015  . Neuropathy ([place]) 06/10/2015  . Pedal edema 01/30/2015  . Hip pain, chronic 01/30/2015  . Back pain 01/30/2015  . Cramps of lower extremity 01/30/2015  . Aftercare following surgery of the circulatory system, [place] 08/05/2014  . COPD exacerbation ([place]) 01/10/2014  . Annual physical exam 10/19/2011  . DJD (degenerative joint disease) 12/14/2010  . Anemia 06/06/2009  . COPD (chronic obstructive pulmonary disease) ([place]) 06/06/2009  . RENAL CELL CANCER 12/25/2008  . HTN (hypertension) 12/25/2008  . AAA (abdominal aortic aneurysm) without rupture ([place]) 12/25/2008  . CKD (chronic kidney disease) 12/25/2008  . HYPERCHOLESTEROLEMIA 03/01/2008  . GERD 03/01/2008  . ACNE ROSACEA 11/29/2007  . DIVERTICULOSIS, COLON 04/12/2007    Percival Spanish, PT, MPT 05/07/2016, 11:08 AM  [facility] [address]  [place], [place], [address] Phone: [phone]   Fax:  [phone]  Name: [name] MRN: [id number] Date of Birth: 09/12/1927

## 2016-05-10 ENCOUNTER — Ambulatory Visit: Payer: Medicare Other | Admitting: Physical Therapy

## 2016-05-10 DIAGNOSIS — M6281 Muscle weakness (generalized): Secondary | ICD-10-CM | POA: Diagnosis not present

## 2016-05-10 DIAGNOSIS — R279 Unspecified lack of coordination: Secondary | ICD-10-CM | POA: Diagnosis not present

## 2016-05-10 DIAGNOSIS — R2681 Unsteadiness on feet: Secondary | ICD-10-CM

## 2016-05-10 DIAGNOSIS — R2689 Other abnormalities of gait and mobility: Secondary | ICD-10-CM | POA: Diagnosis not present

## 2016-05-10 DIAGNOSIS — R262 Difficulty in walking, not elsewhere classified: Secondary | ICD-10-CM

## 2016-05-10 NOTE — Therapy (Signed)
[facility] [address]  [place], [place], [address] Phone: [phone]   Fax:  [phone]  Physical Therapy Treatment  Patient Details  Name: [name] MRN: [id number] Date of Birth: Feb 18, 1927 Referring Provider: Dr. Louanne Skye   Encounter Date: 05/10/2016      PT End of Session - 05/10/16 0851    Visit Number 8   Number of Visits 16   Date for PT Re-Evaluation 06/08/16   PT Start Time 0848   PT Stop Time 0927   PT Time Calculation (min) 39 min   Activity Tolerance Patient tolerated treatment well   Behavior During Therapy [facility] for tasks assessed/performed      Past Medical History  Diagnosis Date  . Abdominal aortic aneurysm ([place]) 09/2008    s/o endovascular repair and angiogram 10/04/08   . Bleeding ulcer 1997  . Leukopenia 2/11    thought to be from doxy  . Renal cell carcinoma     clear cell type dx 10/09, s/p excision 10-24-08  . Hypertension   . Renal insufficiency   . COPD (chronic obstructive pulmonary disease) ([place])   . Anemia     EGD Cscope 08-2012  . CKD (chronic kidney disease), stage IV (HCC)     Stable, Dr. Dimas Aguas  . Solitary left kidney     W/ chronic tubule-interstitial damage, Dr. Dimas Aguas  . Spinal stenosis     causing LE weakness-persistant, being worked up by Hydrographic surveyor and neurologist  . H/O hyperkalemia     Stable    Past Surgical History  Procedure Laterality Date  . Mohs surgery      1997 (lip), 2004  . Hemorrhoid surgery  1990  . Nephrectomy  2009    Open nephrectomy for cancer  . Abdominal aortic aneurysm repair  2009    EVAR    There were no vitals filed for this visit.      Subjective Assessment - 05/10/16 0849    Subjective "Back is better today, but my balance is still a little off."   Currently in Pain? No/denies           Today's Treatment  TherEx  Treadmill - 1.2 mph x 5' TrA + Hooklying Alternating Hip ABD/ER with blue TB  x15 TrA + Hooklying March with blue TB x15 Bridge + Hip ABD isometric with blue TB x10 B sidelying Hip clam shell with blue TB x10 each   Neuro Alternating Lateral step and reach to touch cone placed on top of vertical 6" foam roll x10 Alternating Forward step onto blue foam Airex pad + ipsilateral reach to touch cone placed on top of vertical 6" foam roll x10 Alternating Forward step onto blue foam Airex pad + contralateral reach to touch cone placed on top of vertical 6" foam roll x10 Alternating Lateral Step-up onto 6" step x10 Alternating Step-touch from blue foam Airex to 8" step x10           PT Short Term Goals - 05/07/16 0929    PT SHORT TERM GOAL #1   Title Pt will be independent with lumbar HEP by 05/11/16   Status Achieved           PT Long Term Goals - 05/07/16 0929    PT LONG TERM GOAL #1   Title Pt will be independent with advance lumbar +/- balance HEP by 06/08/16   Status On-going   PT LONG TERM GOAL #2  Title Pt will improve bilateral LE strength to 4/5 or greater by 06/08/16   Status On-going   PT LONG TERM GOAL #3   Title Pt will demonstrate improvement on the Berg Balance Scale to 49/56 or greater to reduce risk for falls by 06/08/16   Status On-going   PT LONG TERM GOAL #4   Title Pt will improve gait speed to 2.0 ft/sec or greater for improved safety with ambulation by 06/08/16   Status On-going               Plan - 05/10/16 0906    Clinical Impression Statement Reviewed lumbar stabilization exercises and increased resistance to blue TB with good pt tolernace therefore blue TB provided for home use. Focused on stepping and reaching activites with UE & LE to promote improved balance with pt requiring occasional cues to slow down and correct patterns.   PT Treatment/Interventions Patient/family education;Therapeutic exercise;Therapeutic activities;Neuromuscular re-education;Balance training;Functional mobility training;Gait training;Manual  techniques;ADLs/Self Care Home Management   PT Next Visit Plan Balance/coordination training,  Lumbar flexibility/stabilization   Consulted and Agree with Plan of Care Patient      Patient will benefit from skilled therapeutic intervention in order to improve the following deficits and impairments:  Difficulty walking, Abnormal gait, Decreased balance, Decreased coordination, Decreased strength, Impaired flexibility, Decreased mobility, Decreased activity tolerance, Decreased endurance, Pain  Visit Diagnosis: Unsteadiness on feet  Unspecified lack of coordination  Difficulty in walking, not elsewhere classified  Other abnormalities of gait and mobility  Muscle weakness (generalized)     Problem List Patient Active Problem List   Diagnosis Date Noted  . Follow-up --- PCP NOTES 09/02/2015  . Neuropathy ([place]) 06/10/2015  . Pedal edema 01/30/2015  . Hip pain, chronic 01/30/2015  . Back pain 01/30/2015  . Cramps of lower extremity 01/30/2015  . Aftercare following surgery of the circulatory system, [place] 08/05/2014  . COPD exacerbation ([place]) 01/10/2014  . Annual physical exam 10/19/2011  . DJD (degenerative joint disease) 12/14/2010  . Anemia 06/06/2009  . COPD (chronic obstructive pulmonary disease) ([place]) 06/06/2009  . RENAL CELL CANCER 12/25/2008  . HTN (hypertension) 12/25/2008  . AAA (abdominal aortic aneurysm) without rupture ([place]) 12/25/2008  . CKD (chronic kidney disease) 12/25/2008  . HYPERCHOLESTEROLEMIA 03/01/2008  . GERD 03/01/2008  . ACNE ROSACEA 11/29/2007  . DIVERTICULOSIS, COLON 04/12/2007    Percival Spanish, PT, MPT 05/10/2016, 9:31 AM  [facility] [place], [place], [address] Phone: [phone]   Fax:  [phone]  Name: [name] MRN: [id number] Date of Birth: 07-02-1927

## 2016-05-12 ENCOUNTER — Ambulatory Visit: Payer: Medicare Other

## 2016-05-12 DIAGNOSIS — R279 Unspecified lack of coordination: Secondary | ICD-10-CM

## 2016-05-12 DIAGNOSIS — R2681 Unsteadiness on feet: Secondary | ICD-10-CM

## 2016-05-12 DIAGNOSIS — M6281 Muscle weakness (generalized): Secondary | ICD-10-CM | POA: Diagnosis not present

## 2016-05-12 DIAGNOSIS — R2689 Other abnormalities of gait and mobility: Secondary | ICD-10-CM

## 2016-05-12 DIAGNOSIS — R262 Difficulty in walking, not elsewhere classified: Secondary | ICD-10-CM | POA: Diagnosis not present

## 2016-05-12 NOTE — Therapy (Signed)
[facility] [address]  [place], [place], [address] Phone: [phone]   Fax:  [phone]  Physical Therapy Treatment  Patient Details  Name: [name] MRN: [id number] Date of Birth: 1927-04-18 Referring Provider: Dr. Louanne Skye   Encounter Date: 05/12/2016      PT End of Session - 05/12/16 1146    Visit Number 9   Number of Visits 16   Date for PT Re-Evaluation 06/08/16   PT Start Time 0849   PT Stop Time 0930   PT Time Calculation (min) 41 min   Activity Tolerance Patient tolerated treatment well   Behavior During Therapy [facility] for tasks assessed/performed      Past Medical History  Diagnosis Date  . Abdominal aortic aneurysm ([place]) 09/2008    s/o endovascular repair and angiogram 10/04/08   . Bleeding ulcer 1997  . Leukopenia 2/11    thought to be from doxy  . Renal cell carcinoma     clear cell type dx 10/09, s/p excision 10-24-08  . Hypertension   . Renal insufficiency   . COPD (chronic obstructive pulmonary disease) ([place])   . Anemia     EGD Cscope 08-2012  . CKD (chronic kidney disease), stage IV (HCC)     Stable, Dr. Dimas Aguas  . Solitary left kidney     W/ chronic tubule-interstitial damage, Dr. Dimas Aguas  . Spinal stenosis     causing LE weakness-persistant, being worked up by Hydrographic surveyor and neurologist  . H/O hyperkalemia     Stable    Past Surgical History  Procedure Laterality Date  . Mohs surgery      1997 (lip), 2004  . Hemorrhoid surgery  1990  . Nephrectomy  2009    Open nephrectomy for cancer  . Abdominal aortic aneurysm repair  2009    EVAR    There were no vitals filed for this visit.      Subjective Assessment - 05/12/16 0900    Subjective Pt. reports he is pain free today however he just feels tiredness in the legs today.     Patient Stated Goals "To get rid of the heaviness in my legs and get better balance."   Currently in Pain? No/denies   Pain Score 0-No  pain   Multiple Pain Sites No     Today's Treatment:  TherEx: NuStep: level 4, 6 min Hooklying bridge x 10 reps  Hooklying B hip abd/ER with blue TB x 10 reps  B sidelying clam shell with blue TB x 10 reps each side  Hooklying bridge with B isometric hip abd/ER with blue TB x 10 reps TrA + Hooklying Alternating Hip ABD/ER with blue TB x15 TrA + Hooklying March with blue TB x10  Neuro: Alternating step-touch on 8" step x 10 reps each side Alternating Step-touch from blue foam Airex to 8" step 2 x 10 reps Seated B scapular retraction row with blue TB 3 x 10 reps; with tactile cues for posture re-education and scapular activation; pt. responded very well with this BATCA low row 15# x 10 reps; with tactile cues for posture re-education and scapular activation; pt. responded very well with this        PT Short Term Goals - 05/07/16 0929    PT SHORT TERM GOAL #1   Title Pt will be independent with lumbar HEP by 05/11/16   Status Achieved           PT Long Term  Goals - 05/07/16 0929    PT LONG TERM GOAL #1   Title Pt will be independent with advance lumbar +/- balance HEP by 06/08/16   Status On-going   PT LONG TERM GOAL #2   Title Pt will improve bilateral LE strength to 4/5 or greater by 06/08/16   Status On-going   PT LONG TERM GOAL #3   Title Pt will demonstrate improvement on the Berg Balance Scale to 49/56 or greater to reduce risk for falls by 06/08/16   Status On-going   PT LONG TERM GOAL #4   Title Pt will improve gait speed to 2.0 ft/sec or greater for improved safety with ambulation by 06/08/16   Status On-going               Plan - 05/12/16 1147    Clinical Impression Statement Pt. performed very well with all lumbopelvic strengthening activities and standing / stepping activities today.  Corner balance added today with pt. tolerating well able to maintain balance with narrow stance and brief eyes closed static standing.  10th treatment next treatment; plan  for BERG balance assessment and FOTO with PT.    PT Treatment/Interventions Patient/family education;Therapeutic exercise;Therapeutic activities;Neuromuscular re-education;Balance training;Functional mobility training;Gait training;Manual techniques;ADLs/Self Care Home Management   PT Next Visit Plan BERG; FOTO; 10th visit G-code; Balance/coordination training,  Lumbar flexibility/stabilization      Patient will benefit from skilled therapeutic intervention in order to improve the following deficits and impairments:  Difficulty walking, Abnormal gait, Decreased balance, Decreased coordination, Decreased strength, Impaired flexibility, Decreased mobility, Decreased activity tolerance, Decreased endurance, Pain  Visit Diagnosis: Unsteadiness on feet  Unspecified lack of coordination  Difficulty in walking, not elsewhere classified  Other abnormalities of gait and mobility  Muscle weakness (generalized)     Problem List Patient Active Problem List   Diagnosis Date Noted  . Follow-up --- PCP NOTES 09/02/2015  . Neuropathy ([place]) 06/10/2015  . Pedal edema 01/30/2015  . Hip pain, chronic 01/30/2015  . Back pain 01/30/2015  . Cramps of lower extremity 01/30/2015  . Aftercare following surgery of the circulatory system, [place] 08/05/2014  . COPD exacerbation ([place]) 01/10/2014  . Annual physical exam 10/19/2011  . DJD (degenerative joint disease) 12/14/2010  . Anemia 06/06/2009  . COPD (chronic obstructive pulmonary disease) ([place]) 06/06/2009  . RENAL CELL CANCER 12/25/2008  . HTN (hypertension) 12/25/2008  . AAA (abdominal aortic aneurysm) without rupture ([place]) 12/25/2008  . CKD (chronic kidney disease) 12/25/2008  . HYPERCHOLESTEROLEMIA 03/01/2008  . GERD 03/01/2008  . ACNE ROSACEA 11/29/2007  . DIVERTICULOSIS, COLON 04/12/2007    Bess Harvest, PTA 05/12/2016, 11:57 AM  [facility] [address]  [place], [place],  [address] Phone: [phone]   Fax:  [phone]  Name: [name] MRN: [id number] Date of Birth: 1927-08-03

## 2016-05-18 ENCOUNTER — Ambulatory Visit: Payer: Medicare Other | Admitting: Physical Therapy

## 2016-05-18 DIAGNOSIS — R262 Difficulty in walking, not elsewhere classified: Secondary | ICD-10-CM

## 2016-05-18 DIAGNOSIS — G20 Parkinson's disease: Secondary | ICD-10-CM | POA: Diagnosis not present

## 2016-05-18 DIAGNOSIS — R4189 Other symptoms and signs involving cognitive functions and awareness: Secondary | ICD-10-CM | POA: Diagnosis not present

## 2016-05-18 DIAGNOSIS — R2689 Other abnormalities of gait and mobility: Secondary | ICD-10-CM | POA: Diagnosis not present

## 2016-05-18 DIAGNOSIS — R2681 Unsteadiness on feet: Secondary | ICD-10-CM

## 2016-05-18 DIAGNOSIS — R279 Unspecified lack of coordination: Secondary | ICD-10-CM | POA: Diagnosis not present

## 2016-05-18 DIAGNOSIS — M6281 Muscle weakness (generalized): Secondary | ICD-10-CM | POA: Diagnosis not present

## 2016-05-18 DIAGNOSIS — G629 Polyneuropathy, unspecified: Secondary | ICD-10-CM | POA: Diagnosis not present

## 2016-05-18 NOTE — Therapy (Signed)
[facility] [address]  [place], [place], [address] Phone: [phone]   Fax:  [phone]  Physical Therapy Treatment  Patient Details  Name: [name] MRN: [id number] Date of Birth: 10/12/1927 Referring Provider: Basil Dess, MD  Encounter Date: 05/18/2016      PT End of Session - 05/18/16 0805    Visit Number 10   Number of Visits 16   Date for PT Re-Evaluation 06/08/16   PT Start Time 0801   PT Stop Time 0848   PT Time Calculation (min) 47 min   Activity Tolerance Patient tolerated treatment well   Behavior During Therapy [facility] for tasks assessed/performed      Past Medical History  Diagnosis Date  . Abdominal aortic aneurysm ([place]) 09/2008    s/o endovascular repair and angiogram 10/04/08   . Bleeding ulcer 1997  . Leukopenia 2/11    thought to be from doxy  . Renal cell carcinoma     clear cell type dx 10/09, s/p excision 10-24-08  . Hypertension   . Renal insufficiency   . COPD (chronic obstructive pulmonary disease) ([place])   . Anemia     EGD Cscope 08-2012  . CKD (chronic kidney disease), stage IV (HCC)     Stable, Dr. Dimas Aguas  . Solitary left kidney     W/ chronic tubule-interstitial damage, Dr. Dimas Aguas  . Spinal stenosis     causing LE weakness-persistant, being worked up by Hydrographic surveyor and neurologist  . H/O hyperkalemia     Stable    Past Surgical History  Procedure Laterality Date  . Mohs surgery      1997 (lip), 2004  . Hemorrhoid surgery  1990  . Nephrectomy  2009    Open nephrectomy for cancer  . Abdominal aortic aneurysm repair  2009    EVAR    There were no vitals filed for this visit.      Subjective Assessment - 05/18/16 0804    Subjective Pt's dtr reports he was able to walk for 30 minutes on the treadmill yesterday without any problems   Patient Stated Goals "To get rid of the heaviness in my legs and get better balance."   Currently in Pain? No/denies             [facility] PT Assessment - 05/18/16 0801    Assessment   Medical Diagnosis Lumbar spinal stenosis & Parkinson's disease   Referring Provider Basil Dess, MD   Observation/Other Assessments   Focus on Therapeutic Outcomes (FOTO)  Neuromuscular disorder 55% (45% limitation)   ROM / Strength   AROM / PROM / Strength AROM;Strength   AROM   AROM Assessment Site Lumbar   Lumbar Flexion 75%    Lumbar Extension 30%   Lumbar - Right Side Bend 75%   Lumbar - Left Side Bend 75%   Lumbar - Right Rotation 60%   Lumbar - Left Rotation 70%   Strength   Overall Strength Comments tested in sitting   Strength Assessment Site Hip;Knee   Right/Left Hip Right;Left   Right Hip Flexion 4-/5   Right Hip Extension 4-/5   Right Hip ABduction 4+/5   Right Hip ADduction 4/5   Left Hip Flexion 4-/5   Left Hip Extension 4-/5   Left Hip ABduction 4+/5   Left Hip ADduction 4/5   Right/Left Knee Right;Left   Right Knee Flexion 4+/5   Right Knee Extension 4+/5   Left Knee Flexion 4+/5  Left Knee Extension 4+/5   Right/Left Ankle Right;Left   Right Ankle Dorsiflexion 4/5   Right Ankle Plantar Flexion 4/5   Left Ankle Dorsiflexion 4/5   Left Ankle Plantar Flexion 4/5   Standardized Balance Assessment   Standardized Balance Assessment Berg Balance Test;Timed Up and Go Test;Five Times Sit to Stand;10 meter walk test   Five times sit to stand comments  15.18 sec   10 Meter Walk 2.06 ft/sec at preferred walking speed  15.94"   Berg Balance Test   Sit to Stand Able to stand without using hands and stabilize independently   Standing Unsupported Able to stand safely 2 minutes   Sitting with Back Unsupported but Feet Supported on Floor or Stool Able to sit safely and securely 2 minutes   Stand to Sit Sits safely with minimal use of hands   Transfers Able to transfer safely, minor use of hands   Standing Unsupported with Eyes Closed Able to stand 10 seconds with supervision   Standing Ubsupported  with Feet Together Able to place feet together independently and stand 1 minute safely   From Standing, Reach Forward with Outstretched Arm Can reach forward >12 cm safely (5")   From Standing Position, Pick up Object from Floor Able to pick up shoe safely and easily   From Standing Position, Turn to Look Behind Over each Shoulder Looks behind one side only/other side shows less weight shift   Turn 360 Degrees Able to turn 360 degrees safely but slowly  4.63" to L, 5.4" to R   Standing Unsupported, Alternately Place Feet on Step/Stool Able to stand independently and complete 8 steps >20 seconds  14.53" but impulsive movements creating instability   Standing Unsupported, One Foot in Front Able to take small step independently and hold 30 seconds  able to place R foot fwd of L but very unsteady by end of 30   Standing on One Leg Tries to lift leg/unable to hold 3 seconds but remains standing independently  1.44" on L   Total Score 45   Berg comment: 37-45 significant risk for fall (>80%)    Timed Up and Go Test   Normal TUG (seconds) 13.22  avg of 2 trials (14.75 + 11.69)           Today's Treatment  TherEx Treadmill - 1.2 mph x 5'  MMT  Berg 5x STS TUG 10 m walk test  Neuro B Staggered stance + weight shift with feet on 2 blue foam Airex pads Marching in place on blue foam Airex pad Alternating lateral fwd step-touch on 8" step x 10 reps each side          PT Short Term Goals - 05/07/16 0929    PT SHORT TERM GOAL #1   Title Pt will be independent with lumbar HEP by 05/11/16   Status Achieved           PT Long Term Goals - 05/18/16 1352    PT LONG TERM GOAL #1   Title Pt will be independent with advance lumbar +/- balance HEP by 06/08/16   Status Partially Met  Met for lumbar HEP; Balance HEP not yet initiated   PT LONG TERM GOAL #2   Title Pt will improve bilateral LE strength to 4/5 or greater by 06/08/16   Status On-going  Met except B hip flexion &  extension 4-/5   PT LONG TERM GOAL #3   Title Pt will demonstrate improvement on the [employer]  Scale to 49/56 or greater to reduce risk for falls by 06/08/16   Status On-going  Currently 45/56   PT LONG TERM GOAL #4   Title Pt will improve gait speed to 2.0 ft/sec or greater for improved safety with ambulation by 06/08/16   Status Achieved  Currently 2.06 ft/sec               Plan - June 06, 2016 1342    Clinical Impression Statement Pt has demonstrated excellent progress with PT so far with improved lumbar flexibility w/o c/o pain and increased LE strength by at least 1/2 grade throughout. Improving balance evident from [facility] Scale score improved from 39/56 to 45/56, TUG time decreased by nearly 2 sec and gait speed increased by ~0.5 ft/sec. Pt continues to have potential for further improvement in balance and gait stability and will work towards updating home program to inlcuding balance activities.   PT Treatment/Interventions Patient/family education;Therapeutic exercise;Therapeutic activities;Neuromuscular re-education;Balance training;Functional mobility training;Gait training;Manual techniques;ADLs/Self Care Home Management   PT Next Visit Plan Balance/coordination training with appropriate additions to HEP, Lumbar flexibility/stabilization   Consulted and Agree with Plan of Care Patient   Family Member Consulted Daughter - [name]      Patient will benefit from skilled therapeutic intervention in order to improve the following deficits and impairments:  Difficulty walking, Abnormal gait, Decreased balance, Decreased coordination, Decreased strength, Impaired flexibility, Decreased mobility, Decreased activity tolerance, Decreased endurance, Pain  Visit Diagnosis: Unsteadiness on feet  Unspecified lack of coordination  Difficulty in walking, not elsewhere classified  Other abnormalities of gait and mobility  Muscle weakness (generalized)       G-Codes - 06/06/16  0807    Functional Assessment Tool Used Merrilee Jansky Balance Scale = 45/56 (20% limitation) + clinical judgement based on cormorbidities of lumbar stenosis & Parkinson's (Neuromuscular disorder FOTO = 55% (45% limitation))   Functional Limitation Mobility: Walking and moving around   Mobility: Walking and Moving Around Current Status ([id number]) At least 20 percent but less than 40 percent impaired, limited or restricted   Mobility: Walking and Moving Around Goal Status [phone]) At least 20 percent but less than 40 percent impaired, limited or restricted      Problem List Patient Active Problem List   Diagnosis Date Noted  . Follow-up --- PCP NOTES 09/02/2015  . Neuropathy ([place]) 06/10/2015  . Pedal edema 01/30/2015  . Hip pain, chronic 01/30/2015  . Back pain 01/30/2015  . Cramps of lower extremity 01/30/2015  . Aftercare following surgery of the circulatory system, [place] 08/05/2014  . COPD exacerbation ([place]) 01/10/2014  . Annual physical exam 10/19/2011  . DJD (degenerative joint disease) 12/14/2010  . Anemia 06/06/2009  . COPD (chronic obstructive pulmonary disease) ([place]) 06/06/2009  . RENAL CELL CANCER 12/25/2008  . HTN (hypertension) 12/25/2008  . AAA (abdominal aortic aneurysm) without rupture ([place]) 12/25/2008  . CKD (chronic kidney disease) 12/25/2008  . HYPERCHOLESTEROLEMIA 03/01/2008  . GERD 03/01/2008  . ACNE ROSACEA 11/29/2007  . DIVERTICULOSIS, COLON 04/12/2007    Percival Spanish, PT, MPT 06-06-16, 1:58 PM  [facility] [address]  [place], [place], [address] Phone: [phone]   Fax:  [phone]  Name: [name] MRN: [id number] Date of Birth: 09-03-1927

## 2016-05-20 ENCOUNTER — Ambulatory Visit: Payer: Medicare Other

## 2016-05-20 DIAGNOSIS — R2689 Other abnormalities of gait and mobility: Secondary | ICD-10-CM | POA: Diagnosis not present

## 2016-05-20 DIAGNOSIS — R262 Difficulty in walking, not elsewhere classified: Secondary | ICD-10-CM

## 2016-05-20 DIAGNOSIS — R2681 Unsteadiness on feet: Secondary | ICD-10-CM | POA: Diagnosis not present

## 2016-05-20 DIAGNOSIS — R279 Unspecified lack of coordination: Secondary | ICD-10-CM

## 2016-05-20 DIAGNOSIS — M6281 Muscle weakness (generalized): Secondary | ICD-10-CM | POA: Diagnosis not present

## 2016-05-20 NOTE — Therapy (Signed)
[facility] [address]  [place], [place], [address] Phone: [phone]   Fax:  [phone]  Physical Therapy Treatment  Patient Details  Name: [name] MRN: [id number] Date of Birth: 10-Jul-1927 Referring Provider: Basil Dess, MD  Encounter Date: 05/20/2016      PT End of Session - 05/20/16 0927    Visit Number 11   Number of Visits 16   Date for PT Re-Evaluation 06/08/16   PT Start Time 0852   PT Stop Time 0932   PT Time Calculation (min) 40 min   Activity Tolerance Patient tolerated treatment well   Behavior During Therapy [facility] for tasks assessed/performed      Past Medical History  Diagnosis Date  . Abdominal aortic aneurysm ([place]) 09/2008    s/o endovascular repair and angiogram 10/04/08   . Bleeding ulcer 1997  . Leukopenia 2/11    thought to be from doxy  . Renal cell carcinoma     clear cell type dx 10/09, s/p excision 10-24-08  . Hypertension   . Renal insufficiency   . COPD (chronic obstructive pulmonary disease) ([place])   . Anemia     EGD Cscope 08-2012  . CKD (chronic kidney disease), stage IV (HCC)     Stable, Dr. Dimas Aguas  . Solitary left kidney     W/ chronic tubule-interstitial damage, Dr. Dimas Aguas  . Spinal stenosis     causing LE weakness-persistant, being worked up by Hydrographic surveyor and neurologist  . H/O hyperkalemia     Stable    Past Surgical History  Procedure Laterality Date  . Mohs surgery      1997 (lip), 2004  . Hemorrhoid surgery  1990  . Nephrectomy  2009    Open nephrectomy for cancer  . Abdominal aortic aneurysm repair  2009    EVAR    There were no vitals filed for this visit.      Subjective Assessment - 05/20/16 0924    Subjective Pt. reports his legs are tired today however he states he is pain free today.     Patient Stated Goals "To get rid of the heaviness in my legs and get better balance."   Currently in Pain? No/denies   Multiple Pain  Sites No     Today's Treatment   NuStep: level 4, 4 min Treadmill: 5 min, .8 mph  Balance HEP review (see pt. Instruction for activities)        PT Education - 05/20/16 1837    Education provided Yes   Education Details Balance HEP    Person(s) Educated Patient;Child(ren)   Methods Explanation;Demonstration   Comprehension Verbalized understanding;Need further instruction          PT Short Term Goals - 05/07/16 0929    PT SHORT TERM GOAL #1   Title Pt will be independent with lumbar HEP by 05/11/16   Status Achieved           PT Long Term Goals - 05/18/16 1352    PT LONG TERM GOAL #1   Title Pt will be independent with advance lumbar +/- balance HEP by 06/08/16   Status Partially Met  Met for lumbar HEP; Balance HEP not yet initiated   PT LONG TERM GOAL #2   Title Pt will improve bilateral LE strength to 4/5 or greater by 06/08/16   Status On-going  Met except B hip flexion & extension 4-/5   PT LONG TERM GOAL #3  Title Pt will demonstrate improvement on the Berg Balance Scale to 49/56 or greater to reduce risk for falls by 06/08/16   Status On-going  Currently 45/56   PT LONG TERM GOAL #4   Title Pt will improve gait speed to 2.0 ft/sec or greater for improved safety with ambulation by 06/08/16   Status Achieved  Currently 2.06 ft/sec               Plan - 05/20/16 0927    Clinical Impression Statement Pt. with no pain or complaints today; today's treatment focused on demonstration and explanation of balance HEP issued to pt. via handout; pt. albe to demonstrate understanding of all activities well however would benefit from further review.     PT Treatment/Interventions Patient/family education;Therapeutic exercise;Therapeutic activities;Neuromuscular re-education;Balance training;Functional mobility training;Gait training;Manual techniques;ADLs/Self Care Home Management   PT Next Visit Plan Balance/coordination training with appropriate additions to HEP,  Lumbar flexibility/stabilization      Patient will benefit from skilled therapeutic intervention in order to improve the following deficits and impairments:  Difficulty walking, Abnormal gait, Decreased balance, Decreased coordination, Decreased strength, Impaired flexibility, Decreased mobility, Decreased activity tolerance, Decreased endurance, Pain  Visit Diagnosis: Unsteadiness on feet  Unspecified lack of coordination  Difficulty in walking, not elsewhere classified  Other abnormalities of gait and mobility  Muscle weakness (generalized)     Problem List Patient Active Problem List   Diagnosis Date Noted  . Follow-up --- PCP NOTES 09/02/2015  . Neuropathy ([place]) 06/10/2015  . Pedal edema 01/30/2015  . Hip pain, chronic 01/30/2015  . Back pain 01/30/2015  . Cramps of lower extremity 01/30/2015  . Aftercare following surgery of the circulatory system, [place] 08/05/2014  . COPD exacerbation ([place]) 01/10/2014  . Annual physical exam 10/19/2011  . DJD (degenerative joint disease) 12/14/2010  . Anemia 06/06/2009  . COPD (chronic obstructive pulmonary disease) ([place]) 06/06/2009  . RENAL CELL CANCER 12/25/2008  . HTN (hypertension) 12/25/2008  . AAA (abdominal aortic aneurysm) without rupture ([place]) 12/25/2008  . CKD (chronic kidney disease) 12/25/2008  . HYPERCHOLESTEROLEMIA 03/01/2008  . GERD 03/01/2008  . ACNE ROSACEA 11/29/2007  . DIVERTICULOSIS, COLON 04/12/2007    Bess Harvest, PTA 05/20/2016, 6:40 PM  [facility] [address]  [place], [place], [address] Phone: [phone]   Fax:  [phone]  Name: [name] MRN: [id number] Date of Birth: September 09, 1927

## 2016-05-25 ENCOUNTER — Other Ambulatory Visit: Payer: Self-pay | Admitting: Internal Medicine

## 2016-05-25 ENCOUNTER — Ambulatory Visit: Payer: Medicare Other | Admitting: Physical Therapy

## 2016-05-25 DIAGNOSIS — R2689 Other abnormalities of gait and mobility: Secondary | ICD-10-CM | POA: Diagnosis not present

## 2016-05-25 DIAGNOSIS — R2681 Unsteadiness on feet: Secondary | ICD-10-CM

## 2016-05-25 DIAGNOSIS — R262 Difficulty in walking, not elsewhere classified: Secondary | ICD-10-CM | POA: Diagnosis not present

## 2016-05-25 DIAGNOSIS — M6281 Muscle weakness (generalized): Secondary | ICD-10-CM

## 2016-05-25 DIAGNOSIS — R279 Unspecified lack of coordination: Secondary | ICD-10-CM | POA: Diagnosis not present

## 2016-05-25 NOTE — Therapy (Signed)
[facility] [address]  [place], [place], [address] Phone: [phone]   Fax:  [phone]  Physical Therapy Treatment  Patient Details  Name: [name] MRN: [id number] Date of Birth: [birth year] Referring Provider: Basil Dess, MD  Encounter Date: 05/25/2016      PT End of Session - 05/25/16 0853    Visit Number 12   Number of Visits 16   Date for PT Re-Evaluation 06/08/16   PT Start Time 0848   PT Stop Time 0930   PT Time Calculation (min) 42 min   Equipment Utilized During Treatment Gait belt   Activity Tolerance Patient tolerated treatment well   Behavior During Therapy [facility] for tasks assessed/performed      Past Medical History  Diagnosis Date  . Abdominal aortic aneurysm ([place]) 09/2008    s/o endovascular repair and angiogram 10/04/08   . Bleeding ulcer 1997  . Leukopenia 2/11    thought to be from doxy  . Renal cell carcinoma     clear cell type dx 10/09, s/p excision 10-24-08  . Hypertension   . Renal insufficiency   . COPD (chronic obstructive pulmonary disease) ([place])   . Anemia     EGD Cscope 08-2012  . CKD (chronic kidney disease), stage IV (HCC)     Stable, Dr. Dimas Aguas  . Solitary left kidney     W/ chronic tubule-interstitial damage, Dr. Dimas Aguas  . Spinal stenosis     causing LE weakness-persistant, being worked up by Hydrographic surveyor and neurologist  . H/O hyperkalemia     Stable    Past Surgical History  Procedure Laterality Date  . Mohs surgery      1997 (lip), 2004  . Hemorrhoid surgery  1990  . Nephrectomy  2009    Open nephrectomy for cancer  . Abdominal aortic aneurysm repair  2009    EVAR    There were no vitals filed for this visit.      Subjective Assessment - 05/25/16 0852    Subjective Pt's hearing more limited today secondary hearing aids not working properly. Denies back pain but states back feels "heavy".   Currently in Pain? No/denies          Today's Treatment  TherEx NuStep - lvl 4 x 4'  Neuro Standing with back to corner:    Rhomberg stance with narrow BOS     B Staggered stance (attempted with Rhomberg but pt having difficulty maintaining position)    B SLS (only able to hold for 1-2 sec at a time w/o UE support) B Staggered stance with crossbody row with black TB x10 Alternating fwd step onto blue foam Airex with ipsilateral & contralateral reach to cone on vertical 6" foam rolls Standing on blue foam Airex alternating step-touch to 9" step x20, initially with HHA of PT for 1st few steps B Staggered stance + weight shift with feet on 2 blue foam Airex pads Foam balance beam:    B Sidestepping with HHA working on wider step x 3 passes, w/o HHA x 1 pass B    Tandem gait with HHA x 4 passes, w/o HHA x 2 passes          PT Short Term Goals - 05/07/16 0929    PT SHORT TERM GOAL #1   Title Pt will be independent with lumbar HEP by 05/11/16   Status Achieved           PT Long  Term Goals - 05/25/16 1149    PT LONG TERM GOAL #1   Title Pt will be independent with advance lumbar +/- balance HEP by 06/08/16   Status Partially Met  Met for lumbar HEP; Balance HEP provided as of last visit   PT LONG TERM GOAL #2   Title Pt will improve bilateral LE strength to 4/5 or greater by 06/08/16   Status On-going  Met except B hip flexion & extension 4-/5   PT LONG TERM GOAL #3   Title Pt will demonstrate improvement on the Berg Balance Scale to 49/56 or greater to reduce risk for falls by 06/08/16   Status On-going  Currently 45/56   PT LONG TERM GOAL #4   Title Pt will improve gait speed to 2.0 ft/sec or greater for improved safety with ambulation by 06/08/16   Status Achieved  Currently 2.06 ft/sec               Plan - 05/25/16 1145    Clinical Impression Statement Pt denies back pain today with only c/o "heaviness". Continued to focus on balance and coordination activities incorporating some of recent HEP  activities with pt demonstrating increased difficulty with staggered stance and SLS activities both on compliance and noncompliant surfaces. Introduced foam balance beam with improving confidence and performance after initial passes but continues to rely on HHA.   PT Treatment/Interventions Patient/family education;Therapeutic exercise;Therapeutic activities;Neuromuscular re-education;Balance training;Functional mobility training;Gait training;Manual techniques;ADLs/Self Care Home Management   PT Next Visit Plan Balance/coordination training with appropriate additions to HEP, Lumbar flexibility/stabilization   Consulted and Agree with Plan of Care Patient      Patient will benefit from skilled therapeutic intervention in order to improve the following deficits and impairments:  Difficulty walking, Abnormal gait, Decreased balance, Decreased coordination, Decreased strength, Impaired flexibility, Decreased mobility, Decreased activity tolerance, Decreased endurance, Pain  Visit Diagnosis: Unsteadiness on feet  Unspecified lack of coordination  Difficulty in walking, not elsewhere classified  Other abnormalities of gait and mobility  Muscle weakness (generalized)     Problem List Patient Active Problem List   Diagnosis Date Noted  . Follow-up --- PCP NOTES 09/02/2015  . Neuropathy ([place]) 06/10/2015  . Pedal edema 01/30/2015  . Hip pain, chronic 01/30/2015  . Back pain 01/30/2015  . Cramps of lower extremity 01/30/2015  . Aftercare following surgery of the circulatory system, [place] 08/05/2014  . COPD exacerbation ([place]) 01/10/2014  . Annual physical exam 10/19/2011  . DJD (degenerative joint disease) 12/14/2010  . Anemia 06/06/2009  . COPD (chronic obstructive pulmonary disease) ([place]) 06/06/2009  . RENAL CELL CANCER 12/25/2008  . HTN (hypertension) 12/25/2008  . AAA (abdominal aortic aneurysm) without rupture ([place]) 12/25/2008  . CKD (chronic kidney disease) 12/25/2008  .  HYPERCHOLESTEROLEMIA 03/01/2008  . GERD 03/01/2008  . ACNE ROSACEA 11/29/2007  . DIVERTICULOSIS, COLON 04/12/2007    Percival Spanish, PT, MPT 05/25/2016, 11:51 AM  [facility] [place], [place], [address] Phone: [phone]   Fax:  [phone]  Name: KERRINGTON GREENHALGH MRN: [id number] Date of Birth: [birth year]

## 2016-05-27 ENCOUNTER — Ambulatory Visit: Payer: Medicare Other | Attending: Specialist

## 2016-05-27 DIAGNOSIS — R279 Unspecified lack of coordination: Secondary | ICD-10-CM | POA: Diagnosis not present

## 2016-05-27 DIAGNOSIS — R2681 Unsteadiness on feet: Secondary | ICD-10-CM | POA: Diagnosis not present

## 2016-05-27 DIAGNOSIS — R2689 Other abnormalities of gait and mobility: Secondary | ICD-10-CM | POA: Diagnosis not present

## 2016-05-27 DIAGNOSIS — R262 Difficulty in walking, not elsewhere classified: Secondary | ICD-10-CM

## 2016-05-27 DIAGNOSIS — M6281 Muscle weakness (generalized): Secondary | ICD-10-CM | POA: Diagnosis not present

## 2016-05-27 NOTE — Therapy (Signed)
[facility] [address]  [place], [place], [address] Phone: [phone]   Fax:  [phone]  Physical Therapy Treatment  Patient Details  Name: [name] MRN: [id number] Date of Birth: July 21, 1927 Referring Provider: Basil Dess, MD  Encounter Date: 05/27/2016      PT End of Session - 05/27/16 0944    Visit Number 13   Number of Visits 16   Date for PT Re-Evaluation 06/08/16   PT Start Time 0849   PT Stop Time 0930   PT Time Calculation (min) 41 min   Equipment Utilized During Treatment Gait belt   Activity Tolerance Patient tolerated treatment well   Behavior During Therapy [facility] for tasks assessed/performed      Past Medical History  Diagnosis Date  . Abdominal aortic aneurysm ([place]) 09/2008    s/o endovascular repair and angiogram 10/04/08   . Bleeding ulcer 1997  . Leukopenia 2/11    thought to be from doxy  . Renal cell carcinoma     clear cell type dx 10/09, s/p excision 10-24-08  . Hypertension   . Renal insufficiency   . COPD (chronic obstructive pulmonary disease) ([place])   . Anemia     EGD Cscope 08-2012  . CKD (chronic kidney disease), stage IV (HCC)     Stable, Dr. Dimas Aguas  . Solitary left kidney     W/ chronic tubule-interstitial damage, Dr. Dimas Aguas  . Spinal stenosis     causing LE weakness-persistant, being worked up by Hydrographic surveyor and neurologist  . H/O hyperkalemia     Stable    Past Surgical History  Procedure Laterality Date  . Mohs surgery      1997 (lip), 2004  . Hemorrhoid surgery  1990  . Nephrectomy  2009    Open nephrectomy for cancer  . Abdominal aortic aneurysm repair  2009    EVAR    There were no vitals filed for this visit.      Subjective Assessment - 05/27/16 0943    Subjective Pt. reports his legs are very tired today and that they are getting more tired each day.  Pt. reports he is pain free at this time.     Patient Stated Goals "To get rid of  the heaviness in my legs and get better balance."   Currently in Pain? No/denies   Pain Score 0-No pain   Multiple Pain Sites No    Today's Treatment:  TherEx: NuStep - lvl 4 x 4.5' Seated B hip ER with blue TB 2 x 10 reps  Seated high knee marching with blue TB 2 x 10 reps each  Seated side stepping (hip abd/ER) with blue TB 2 x 10 reps each side   Neuro: Alternating stepping onto BOSU ball (up) 2 x 5 reps each side with cross-over red cone touch on white bolster; cones moved progressively further away each set Seated scapular retraction with blue TB x 10 rep; focusing on promoting upright posture Alternating toe-touch to 6" step x 5 reps each side  Seated scapular retraction with blue TB x 10 rep; focusing on promoting upright posture Alternating toe-touch to 6" step with small paper cups x 5 reps each side  Seated scapular retraction with blue TB x 10 rep; focusing on promoting upright posture Treadmill: 4 min, 1.0 mph (focusing on increased step length, conversation added throughout; pt. able to maintain pace with only one tactile cue  PT Short Term Goals - 05/07/16 0929    PT SHORT TERM GOAL #1   Title Pt will be independent with lumbar HEP by 05/11/16   Status Achieved           PT Long Term Goals - 05/25/16 1149    PT LONG TERM GOAL #1   Title Pt will be independent with advance lumbar +/- balance HEP by 06/08/16   Status Partially Met  Met for lumbar HEP; Balance HEP provided as of last visit   PT LONG TERM GOAL #2   Title Pt will improve bilateral LE strength to 4/5 or greater by 06/08/16   Status On-going  Met except B hip flexion & extension 4-/5   PT LONG TERM GOAL #3   Title Pt will demonstrate improvement on the Berg Balance Scale to 49/56 or greater to reduce risk for falls by 06/08/16   Status On-going  Currently 45/56   PT LONG TERM GOAL #4   Title Pt will improve gait speed to 2.0 ft/sec or greater for improved safety with ambulation by 06/08/16    Status Achieved  Currently 2.06 ft/sec               Plan - 05/27/16 0945    Clinical Impression Statement Pt. reports his legs are very tired today and that they are getting more tired each day.  Pt. reports he is pain free at this time.  Pt. performed well today with all supine hip / knee strengthening activity however with B quad weakness demo'd with all standing / stepping activity; stepping / reaching activity continued with BOSU (up) ball with crossover reaching to cones; close CGA from therapist with all standing activity.    PT Treatment/Interventions Patient/family education;Therapeutic exercise;Therapeutic activities;Neuromuscular re-education;Balance training;Functional mobility training;Gait training;Manual techniques;ADLs/Self Care Home Management   PT Next Visit Plan Balance/coordination training with appropriate additions to HEP, Lumbar flexibility/stabilization      Patient will benefit from skilled therapeutic intervention in order to improve the following deficits and impairments:  Difficulty walking, Abnormal gait, Decreased balance, Decreased coordination, Decreased strength, Impaired flexibility, Decreased mobility, Decreased activity tolerance, Decreased endurance, Pain  Visit Diagnosis: Unsteadiness on feet  Unspecified lack of coordination  Difficulty in walking, not elsewhere classified  Other abnormalities of gait and mobility  Muscle weakness (generalized)     Problem List Patient Active Problem List   Diagnosis Date Noted  . Follow-up --- PCP NOTES 09/02/2015  . Neuropathy ([place]) 06/10/2015  . Pedal edema 01/30/2015  . Hip pain, chronic 01/30/2015  . Back pain 01/30/2015  . Cramps of lower extremity 01/30/2015  . Aftercare following surgery of the circulatory system, [place] 08/05/2014  . COPD exacerbation ([place]) 01/10/2014  . Annual physical exam 10/19/2011  . DJD (degenerative joint disease) 12/14/2010  . Anemia 06/06/2009  . COPD (chronic  obstructive pulmonary disease) ([place]) 06/06/2009  . RENAL CELL CANCER 12/25/2008  . HTN (hypertension) 12/25/2008  . AAA (abdominal aortic aneurysm) without rupture ([place]) 12/25/2008  . CKD (chronic kidney disease) 12/25/2008  . HYPERCHOLESTEROLEMIA 03/01/2008  . GERD 03/01/2008  . ACNE ROSACEA 11/29/2007  . DIVERTICULOSIS, COLON 04/12/2007    Bess Harvest, PTA 05/27/2016, 6:55 PM  [facility] [address]  [place], [place], [address] Phone: [phone]   Fax:  [phone]  Name: JOURNEY RATTERMAN MRN: [id number] Date of Birth: 1927/09/23

## 2016-05-31 ENCOUNTER — Ambulatory Visit: Payer: Medicare Other | Admitting: Physical Therapy

## 2016-05-31 DIAGNOSIS — M6281 Muscle weakness (generalized): Secondary | ICD-10-CM

## 2016-05-31 DIAGNOSIS — R2681 Unsteadiness on feet: Secondary | ICD-10-CM | POA: Diagnosis not present

## 2016-05-31 DIAGNOSIS — R2689 Other abnormalities of gait and mobility: Secondary | ICD-10-CM | POA: Diagnosis not present

## 2016-05-31 DIAGNOSIS — R262 Difficulty in walking, not elsewhere classified: Secondary | ICD-10-CM

## 2016-05-31 DIAGNOSIS — R279 Unspecified lack of coordination: Secondary | ICD-10-CM

## 2016-05-31 NOTE — Therapy (Signed)
[facility] [address]  [place], [place], [address] Phone: [phone]   Fax:  [phone]  Physical Therapy Treatment  Patient Details  Name: [name] MRN: [id number] Date of Birth: June 22, 1927 Referring Provider: Basil Dess, MD  Encounter Date: 05/31/2016      PT End of Session - 05/31/16 1400    Visit Number 14   Number of Visits 16   Date for PT Re-Evaluation 06/08/16   PT Start Time 1400   PT Stop Time 1446   PT Time Calculation (min) 46 min   Equipment Utilized During Treatment Gait belt   Activity Tolerance Patient tolerated treatment well   Behavior During Therapy [facility] for tasks assessed/performed      Past Medical History  Diagnosis Date  . Abdominal aortic aneurysm ([place]) 09/2008    s/o endovascular repair and angiogram 10/04/08   . Bleeding ulcer 1997  . Leukopenia 2/11    thought to be from doxy  . Renal cell carcinoma     clear cell type dx 10/09, s/p excision 10-24-08  . Hypertension   . Renal insufficiency   . COPD (chronic obstructive pulmonary disease) ([place])   . Anemia     EGD Cscope 08-2012  . CKD (chronic kidney disease), stage IV (HCC)     Stable, Dr. Dimas Aguas  . Solitary left kidney     W/ chronic tubule-interstitial damage, Dr. Dimas Aguas  . Spinal stenosis     causing LE weakness-persistant, being worked up by Hydrographic surveyor and neurologist  . H/O hyperkalemia     Stable    Past Surgical History  Procedure Laterality Date  . Mohs surgery      1997 (lip), 2004  . Hemorrhoid surgery  1990  . Nephrectomy  2009    Open nephrectomy for cancer  . Abdominal aortic aneurysm repair  2009    EVAR    There were no vitals filed for this visit.      Subjective Assessment - 05/31/16 1404    Subjective Pt describing a tired achy feeling in distal LE's and feet today, "like your legs vould just give out".   Pain Score 6    Pain Location Foot   Pain Orientation Right;Left    Pain Descriptors / Indicators Tiring;Aching           Today's Treatment  TherEx Treadmill - 1.0 mph x 5' Seated Hip extension with black TB x15 Seated high knee marching with black TB x15 Seated B hip ER with black TB x15 Seated alternating side stepping (hip abd/ER) with black TB x15  Neuro (SBA to CGA with gait belt for all activities) Lateral step and reach to high & low targets on wall  Alternating large step backward targeting forward weight shift with trunk to prevent posterior LOB, initially with TRX support but transitioned to hands on back of chair due to difficulty coordinating UE/LE movement B Lateral step over 1/2 foam roll with TRX support Alternating fwd step-over 1/2 foam roll and back            PT Short Term Goals - 05/07/16 0929    PT SHORT TERM GOAL #1   Title Pt will be independent with lumbar HEP by 05/11/16   Status Achieved           PT Long Term Goals - 05/25/16 1149    PT LONG TERM GOAL #1   Title Pt will be independent with advance lumbar +/-  balance HEP by 06/08/16   Status Partially Met  Met for lumbar HEP; Balance HEP provided as of last visit   PT LONG TERM GOAL #2   Title Pt will improve bilateral LE strength to 4/5 or greater by 06/08/16   Status On-going  Met except B hip flexion & extension 4-/5   PT LONG TERM GOAL #3   Title Pt will demonstrate improvement on the Berg Balance Scale to 49/56 or greater to reduce risk for falls by 06/08/16   Status On-going  Currently 45/56   PT LONG TERM GOAL #4   Title Pt will improve gait speed to 2.0 ft/sec or greater for improved safety with ambulation by 06/08/16   Status Achieved  Currently 2.06 ft/sec               Plan - 05/31/16 1722    Clinical Impression Statement Pt denies pain other than a "tired, achy feeling" in his lower legs and feet today. Introduced more dynamic stepping activities in various directions with pt having moderate difficulty coordinating movments as  intended and requiring frequent rest breaks. Pt denies any issues or concerns with HEP exercises/activities. Pt nearing end of current POC (2 visits remaining), therefore will have PTA focus on review of HEP exercises and balance activities at next visit and then assess readiness for discharge vs need for recert with final visit with PT.   PT Treatment/Interventions Patient/family education;Therapeutic exercise;Therapeutic activities;Neuromuscular re-education;Balance training;Functional mobility training;Gait training;Manual techniques;ADLs/Self Care Home Management   PT Next Visit Plan Review of HEP esp balance/coordination training with appropriate additions/modifications to HEP as indicated, Lumbar flexibility/stabilization      Patient will benefit from skilled therapeutic intervention in order to improve the following deficits and impairments:  Difficulty walking, Abnormal gait, Decreased balance, Decreased coordination, Decreased strength, Impaired flexibility, Decreased mobility, Decreased activity tolerance, Decreased endurance, Pain  Visit Diagnosis: Unsteadiness on feet  Unspecified lack of coordination  Difficulty in walking, not elsewhere classified  Other abnormalities of gait and mobility  Muscle weakness (generalized)     Problem List Patient Active Problem List   Diagnosis Date Noted  . Follow-up --- PCP NOTES 09/02/2015  . Neuropathy ([place]) 06/10/2015  . Pedal edema 01/30/2015  . Hip pain, chronic 01/30/2015  . Back pain 01/30/2015  . Cramps of lower extremity 01/30/2015  . Aftercare following surgery of the circulatory system, [place] 08/05/2014  . COPD exacerbation ([place]) 01/10/2014  . Annual physical exam 10/19/2011  . DJD (degenerative joint disease) 12/14/2010  . Anemia 06/06/2009  . COPD (chronic obstructive pulmonary disease) ([place]) 06/06/2009  . RENAL CELL CANCER 12/25/2008  . HTN (hypertension) 12/25/2008  . AAA (abdominal aortic aneurysm) without rupture  ([place]) 12/25/2008  . CKD (chronic kidney disease) 12/25/2008  . HYPERCHOLESTEROLEMIA 03/01/2008  . GERD 03/01/2008  . ACNE ROSACEA 11/29/2007  . DIVERTICULOSIS, COLON 04/12/2007    Percival Spanish, PT, MPT 05/31/2016, 5:33 PM  [facility] [address]  [place], [place], [address] Phone: [phone]   Fax:  [phone]  Name: ABDURAHMAN RUGG MRN: [id number] Date of Birth: 05-28-1927

## 2016-06-03 ENCOUNTER — Ambulatory Visit: Payer: Medicare Other

## 2016-06-03 DIAGNOSIS — M6281 Muscle weakness (generalized): Secondary | ICD-10-CM | POA: Diagnosis not present

## 2016-06-03 DIAGNOSIS — R2689 Other abnormalities of gait and mobility: Secondary | ICD-10-CM

## 2016-06-03 DIAGNOSIS — R262 Difficulty in walking, not elsewhere classified: Secondary | ICD-10-CM | POA: Diagnosis not present

## 2016-06-03 DIAGNOSIS — R279 Unspecified lack of coordination: Secondary | ICD-10-CM | POA: Diagnosis not present

## 2016-06-03 DIAGNOSIS — R2681 Unsteadiness on feet: Secondary | ICD-10-CM | POA: Diagnosis not present

## 2016-06-03 NOTE — Therapy (Signed)
[facility] [address]  [place], [place], [address] Phone: [phone]   Fax:  [phone]  Physical Therapy Treatment  Patient Details  Name: [name] MRN: [id number] Date of Birth: 21-Jun-1927 Referring Provider: Basil Dess, MD  Encounter Date: 06/03/2016      PT End of Session - 06/03/16 0839    Visit Number 15   Number of Visits 16   Date for PT Re-Evaluation 06/08/16   PT Start Time 0804   PT Stop Time 0850   PT Time Calculation (min) 46 min   Equipment Utilized During Treatment Gait belt   Activity Tolerance Patient tolerated treatment well   Behavior During Therapy [facility] for tasks assessed/performed      Past Medical History  Diagnosis Date  . Abdominal aortic aneurysm ([place]) 09/2008    s/o endovascular repair and angiogram 10/04/08   . Bleeding ulcer 1997  . Leukopenia 2/11    thought to be from doxy  . Renal cell carcinoma     clear cell type dx 10/09, s/p excision 10-24-08  . Hypertension   . Renal insufficiency   . COPD (chronic obstructive pulmonary disease) ([place])   . Anemia     EGD Cscope 08-2012  . CKD (chronic kidney disease), stage IV (HCC)     Stable, Dr. Dimas Aguas  . Solitary left kidney     W/ chronic tubule-interstitial damage, Dr. Dimas Aguas  . Spinal stenosis     causing LE weakness-persistant, being worked up by Hydrographic surveyor and neurologist  . H/O hyperkalemia     Stable    Past Surgical History  Procedure Laterality Date  . Mohs surgery      1997 (lip), 2004  . Hemorrhoid surgery  1990  . Nephrectomy  2009    Open nephrectomy for cancer  . Abdominal aortic aneurysm repair  2009    EVAR    There were no vitals filed for this visit.      Subjective Assessment - 06/03/16 0925    Subjective Pt. reports he has more energy today; daughter reports he changed medications and she feels he is more energetic today.     Patient is accompained by: Family member    Patient Stated Goals "To get rid of the heaviness in my legs and get better balance."   Currently in Pain? No/denies   Pain Score 0-No pain   Multiple Pain Sites No            [facility] PT Assessment - 06/03/16 0828    Berg Balance Test   Sit to Stand Able to stand without using hands and stabilize independently   Standing Unsupported Able to stand safely 2 minutes   Sitting with Back Unsupported but Feet Supported on Floor or Stool Able to sit safely and securely 2 minutes   Stand to Sit Sits safely with minimal use of hands   Transfers Able to transfer safely, minor use of hands   Standing Unsupported with Eyes Closed Able to stand 10 seconds safely   Standing Ubsupported with Feet Together Able to place feet together independently and stand 1 minute safely   From Standing, Reach Forward with Outstretched Arm Can reach forward >5 cm safely (2")   From Standing Position, Pick up Object from Floor Able to pick up shoe safely and easily   From Standing Position, Turn to Look Behind Over each Shoulder Turn sideways only but maintains balance   Turn 360 Degrees  Able to turn 360 degrees safely one side only in 4 seconds or less   Standing Unsupported, Alternately Place Feet on Step/Stool Able to stand independently and safely and complete 8 steps in 20 seconds   Standing Unsupported, One Foot in Front Able to plae foot ahead of the other independently and hold 30 seconds   Standing on One Leg Tries to lift leg/unable to hold 3 seconds but remains standing independently   Total Score 47      Today's Treatment:  Therex: Seated scapular retraction with blue TB x 10 reps  Alternating toe-touch on 8" step x 10 reps each side; close CGA from therapist  Alternating step onto BOSU ball (up) 2 x 5 reps with cross-over reaching to red cone on white bolster; ; close CGA from therapist   Neuro Re-ed (a few repetitions performed for understanding): Rhomberg x 5 sec Rhomberg half tandem x 5 sec    Rhomberg tandem x 5 sec Rhomberg single leg stance (pt. unable to perform due to balance; pt. Reports he skips this activity at home) Static balance: Rhomberg position on foam x 5 sec Single leg stance on airex mat (pt. unable to perform due to balance; pt. Reports he skips this activity at home) Horizontal head turns EO on foam x 5 reps Vertical head movements with EO on foam x 5 reps  Walking forward with big steps x 20 ft Walking with side-to-side head turns x 20 ft Walking with up-and-down head nods x 20 ft        PT Short Term Goals - 05/07/16 0929    PT SHORT TERM GOAL #1   Title Pt will be independent with lumbar HEP by 05/11/16   Status Achieved           PT Long Term Goals - 06/03/16 0932    PT LONG TERM GOAL #1   Title Pt will be independent with advance lumbar +/- balance HEP by 06/08/16   Status Achieved   PT LONG TERM GOAL #2   Title Pt will improve bilateral LE strength to 4/5 or greater by 06/08/16   Status On-going  Met except B hip flexion & extension 4-/5   PT LONG TERM GOAL #3   Title Pt will demonstrate improvement on the Berg Balance Scale to 49/56 or greater to reduce risk for falls by 06/08/16   Status On-going  06/03/16: Currently 47/56   PT LONG TERM GOAL #4   Title Pt will improve gait speed to 2.0 ft/sec or greater for improved safety with ambulation by 06/08/16   Status Achieved  Currently 2.06 ft/sec               Plan - 06/03/16 0839    Clinical Impression Statement Pt. pain free today and appears more energetic today; daughter reports he has changed medications and is more energetic today.  Pt. able to improve BERG balance score to 47/56 today.  Balance / coordination HEP reviewed with pt. today with pt. reporting he performs this regularly and able to demo good understanding.  Pt. tolerated all stepping / reaching activity today well; pt. progressing toward goals and anticipates d/c next treatment per PT assessment of goal status.     PT  Treatment/Interventions Patient/family education;Therapeutic exercise;Therapeutic activities;Neuromuscular re-education;Balance training;Functional mobility training;Gait training;Manual techniques;ADLs/Self Care Home Management   PT Next Visit Plan d/c per PT assessment.      Patient will benefit from skilled therapeutic intervention in order to improve the following deficits  and impairments:  Difficulty walking, Abnormal gait, Decreased balance, Decreased coordination, Decreased strength, Impaired flexibility, Decreased mobility, Decreased activity tolerance, Decreased endurance, Pain  Visit Diagnosis: Unsteadiness on feet  Unspecified lack of coordination  Difficulty in walking, not elsewhere classified  Other abnormalities of gait and mobility  Muscle weakness (generalized)     Problem List Patient Active Problem List   Diagnosis Date Noted  . Follow-up --- PCP NOTES 09/02/2015  . Neuropathy ([place]) 06/10/2015  . Pedal edema 01/30/2015  . Hip pain, chronic 01/30/2015  . Back pain 01/30/2015  . Cramps of lower extremity 01/30/2015  . Aftercare following surgery of the circulatory system, [place] 08/05/2014  . COPD exacerbation ([place]) 01/10/2014  . Annual physical exam 10/19/2011  . DJD (degenerative joint disease) 12/14/2010  . Anemia 06/06/2009  . COPD (chronic obstructive pulmonary disease) ([place]) 06/06/2009  . RENAL CELL CANCER 12/25/2008  . HTN (hypertension) 12/25/2008  . AAA (abdominal aortic aneurysm) without rupture ([place]) 12/25/2008  . CKD (chronic kidney disease) 12/25/2008  . HYPERCHOLESTEROLEMIA 03/01/2008  . GERD 03/01/2008  . ACNE ROSACEA 11/29/2007  . DIVERTICULOSIS, COLON 04/12/2007    Bess Harvest, PTA 06/03/2016, 9:35 AM  [facility] [address]  [place], [place], [address] Phone: [phone]   Fax:  [phone]  Name: [name] MRN: [id number] Date of Birth: November 01, 1927

## 2016-06-07 ENCOUNTER — Ambulatory Visit: Payer: Medicare Other | Admitting: Physical Therapy

## 2016-06-07 DIAGNOSIS — R2689 Other abnormalities of gait and mobility: Secondary | ICD-10-CM

## 2016-06-07 DIAGNOSIS — M6281 Muscle weakness (generalized): Secondary | ICD-10-CM

## 2016-06-07 DIAGNOSIS — R262 Difficulty in walking, not elsewhere classified: Secondary | ICD-10-CM

## 2016-06-07 DIAGNOSIS — R2681 Unsteadiness on feet: Secondary | ICD-10-CM

## 2016-06-07 DIAGNOSIS — R279 Unspecified lack of coordination: Secondary | ICD-10-CM

## 2016-06-07 NOTE — Therapy (Signed)
[facility] [address]  [place], [place], [address] Phone: [phone]   Fax:  [phone]  Physical Therapy Treatment  Patient Details  Name: [name] MRN: [id number] Date of Birth: 04-27-1927 Referring Provider: Basil Dess, MD  Encounter Date: 06/07/2016      PT End of Session - 06/07/16 1400    Visit Number 16   Number of Visits 16   Date for PT Re-Evaluation 06/08/16   PT Start Time 1400   PT Stop Time 1444   PT Time Calculation (min) 44 min   Equipment Utilized During Treatment Gait belt   Activity Tolerance Patient tolerated treatment well   Behavior During Therapy [facility] for tasks assessed/performed      Past Medical History  Diagnosis Date  . Abdominal aortic aneurysm ([place]) 09/2008    s/o endovascular repair and angiogram 10/04/08   . Bleeding ulcer 1997  . Leukopenia 2/11    thought to be from doxy  . Renal cell carcinoma     clear cell type dx 10/09, s/p excision 10-24-08  . Hypertension   . Renal insufficiency   . COPD (chronic obstructive pulmonary disease) ([place])   . Anemia     EGD Cscope 08-2012  . CKD (chronic kidney disease), stage IV (HCC)     Stable, Dr. Dimas Aguas  . Solitary left kidney     W/ chronic tubule-interstitial damage, Dr. Dimas Aguas  . Spinal stenosis     causing LE weakness-persistant, being worked up by Hydrographic surveyor and neurologist  . H/O hyperkalemia     Stable    Past Surgical History  Procedure Laterality Date  . Mohs surgery      1997 (lip), 2004  . Hemorrhoid surgery  1990  . Nephrectomy  2009    Open nephrectomy for cancer  . Abdominal aortic aneurysm repair  2009    EVAR    There were no vitals filed for this visit.      Subjective Assessment - 06/07/16 1404    Subjective [name] noting improvement with PT, with longer srtides while walking and improved balance. Pt denies any recent LBP.   Currently in Pain? No/denies             [facility] PT Assessment - 06/07/16 1400    Assessment   Medical Diagnosis Lumbar spinal stenosis & Parkinson's disease   Referring Provider Basil Dess, MD   Observation/Other Assessments   Focus on Therapeutic Outcomes (FOTO)  Neuromuscular disorder 60% (40% limitation)   AROM   Overall AROM Comments All lumbar ROM w/o pain   AROM Assessment Site Lumbar   Lumbar Flexion 80%   Lumbar Extension 40%   Lumbar - Right Side Bend 80%   Lumbar - Left Side Bend 80%   Lumbar - Right Rotation 75%   Lumbar - Left Rotation 80%   Strength   Strength Assessment Site Hip;Knee;Ankle   Right Hip Flexion 4+/5   Right Hip Extension 4/5   Right Hip ABduction 5/5   Right Hip ADduction 4+/5   Left Hip Flexion 4+/5   Left Hip Extension 4/5   Left Hip ABduction 5/5   Left Hip ADduction 4+/5   Right Knee Flexion 5/5   Right Knee Extension 5/5   Left Knee Flexion 5/5   Left Knee Extension 5/5   Right Ankle Dorsiflexion 4+/5   Right Ankle Plantar Flexion 4+/5   Left Ankle Dorsiflexion 4+/5   Left Ankle Plantar Flexion  4+/5   Standardized Balance Assessment   Standardized Balance Assessment Berg Balance Test;Timed Up and Go Test;Five Times Sit to Stand;10 meter walk test   Five times sit to stand comments  13.28 sec   10 Meter Walk 2.54 ft/sec  12.91"   Berg Balance Test   Sit to Stand Able to stand without using hands and stabilize independently   Standing Unsupported Able to stand safely 2 minutes   Sitting with Back Unsupported but Feet Supported on Floor or Stool Able to sit safely and securely 2 minutes   Stand to Sit Sits safely with minimal use of hands   Transfers Able to transfer safely, minor use of hands   Standing Unsupported with Eyes Closed Able to stand 10 seconds safely   Standing Ubsupported with Feet Together Able to place feet together independently and stand 1 minute safely   From Standing, Reach Forward with Outstretched Arm Can reach forward >12 cm safely (5")   From Standing  Position, Pick up Object from Floor Able to pick up shoe safely and easily   From Standing Position, Turn to Look Behind Over each Shoulder Looks behind from both sides and weight shifts well   Turn 360 Degrees Able to turn 360 degrees safely in 4 seconds or less   Standing Unsupported, Alternately Place Feet on Step/Stool Able to stand independently and safely and complete 8 steps in 20 seconds   Standing Unsupported, One Foot in Front Able to plae foot ahead of the other independently and hold 30 seconds   Standing on One Leg Tries to lift leg/unable to hold 3 seconds but remains standing independently   Total Score 51   Berg comment: 46-51 moderate (~50%)    Timed Up and Go Test   TUG Normal TUG   Normal TUG (seconds) 10.96          Today's Treatment  TherEx Treadmill - 1.2 mph x 5'  Neuro Berg Balance Scale  TUG   5x STS  10 M walk test  MMT  TherEx Verbal review of HEP and expected carryover/progression at home (black TB provided for HEP)          PT Short Term Goals - 05/07/16 0929    PT SHORT TERM GOAL #1   Title Pt will be independent with lumbar HEP by 05/11/16   Status Achieved           PT Long Term Goals - 06/07/16 1428    PT LONG TERM GOAL #1   Title Pt will be independent with advance lumbar +/- balance HEP by 06/08/16   Status Achieved   PT LONG TERM GOAL #2   Title Pt will improve bilateral LE strength to 4/5 or greater by 06/08/16   Status Achieved   PT LONG TERM GOAL #3   Title Pt will demonstrate improvement on the Berg Balance Scale to 49/56 or greater to reduce risk for falls by 06/08/16   Status Achieved  51/56   PT LONG TERM GOAL #4   Title Pt will improve gait speed to 2.0 ft/sec or greater for improved safety with ambulation by 06/08/16   Status Achieved  2.54 ft/sec               Plan - 06/07/16 1448    Clinical Impression Statement Pt and daughter pleased with progress with PT, with pt reporting no recent LBP and  daughter noting improved step length and better gait stability. B LE strength now 4+/5  to 5/5, with exception of hip extension 4/5. Balance improved per standardized testing, with Berg Balance Scale 51/56, TUG 10.96 sec, and 10 M walk test 2.54 ft/sec; all indicating a decreased risk for falls. Pt independent with HEP and instructions provided for carryover and continued progression at home with black TB provided for home use. All goals met for this episode, therefore will proceed with discharge from PT.   PT Treatment/Interventions Patient/family education;Therapeutic exercise;Therapeutic activities;Neuromuscular re-education;Balance training;Functional mobility training;Gait training;Manual techniques;ADLs/Self Care Home Management   PT Next Visit Plan Discharge   Consulted and Agree with Plan of Care Patient;Family member/caregiver   Family Member Consulted Daughter - [name]      Patient will benefit from skilled therapeutic intervention in order to improve the following deficits and impairments:  Difficulty walking, Abnormal gait, Decreased balance, Decreased coordination, Decreased strength, Impaired flexibility, Decreased mobility, Decreased activity tolerance, Decreased endurance, Pain  Visit Diagnosis: Unsteadiness on feet  Unspecified lack of coordination  Difficulty in walking, not elsewhere classified  Other abnormalities of gait and mobility  Muscle weakness (generalized)       G-Codes - 2016-06-10 1444    Functional Assessment Tool Used Berg Balance Scale = 51/56 (8.9% limitation) + clinical judgement based on cormorbidities of lumbar stenosis & Parkinson's (Neuromuscular disorder FOTO = 60% (40% limitation))   Functional Limitation Mobility: Walking and moving around   Mobility: Walking and Moving Around Goal Status [phone]) At least 20 percent but less than 40 percent impaired, limited or restricted   Mobility: Walking and Moving Around Discharge Status [phone]) At least 20  percent but less than 40 percent impaired, limited or restricted      Problem List Patient Active Problem List   Diagnosis Date Noted  . Follow-up --- PCP NOTES 09/02/2015  . Neuropathy ([place]) 06/10/2015  . Pedal edema 01/30/2015  . Hip pain, chronic 01/30/2015  . Back pain 01/30/2015  . Cramps of lower extremity 01/30/2015  . Aftercare following surgery of the circulatory system, [place] 08/05/2014  . COPD exacerbation ([place]) 01/10/2014  . Annual physical exam 10/19/2011  . DJD (degenerative joint disease) 12/14/2010  . Anemia 06/06/2009  . COPD (chronic obstructive pulmonary disease) ([place]) 06/06/2009  . RENAL CELL CANCER 12/25/2008  . HTN (hypertension) 12/25/2008  . AAA (abdominal aortic aneurysm) without rupture ([place]) 12/25/2008  . CKD (chronic kidney disease) 12/25/2008  . HYPERCHOLESTEROLEMIA 03/01/2008  . GERD 03/01/2008  . ACNE ROSACEA 11/29/2007  . DIVERTICULOSIS, COLON 04/12/2007    Percival Spanish, PT, MPT 06-10-2016, 2:58 PM  [facility] [address]  [place], [place], [address] Phone: [phone]   Fax:  [phone]  Name: [name] MRN: [id number] Date of Birth: 1927/10/01   PHYSICAL THERAPY DISCHARGE SUMMARY  Visits from Start of Care: 16  Current functional level related to goals / functional outcomes:   Refer to above Clinical Impression   Remaining deficits:   Pt to continue with low back and balance HEP   Education / Equipment:   HEP  Plan: Patient agrees to discharge.  Patient goals were met. Patient is being discharged due to meeting the stated rehab goals.  ?????       Percival Spanish, PT, MPT June 10, 2016, 3:01 PM  [facility] [address]  [place], [place], [address] Phone: [phone]   Fax:  [phone]

## 2016-06-18 ENCOUNTER — Ambulatory Visit: Payer: Medicare Other | Admitting: Internal Medicine

## 2016-06-18 DIAGNOSIS — Z905 Acquired absence of kidney: Secondary | ICD-10-CM | POA: Diagnosis not present

## 2016-06-18 DIAGNOSIS — K229 Disease of esophagus, unspecified: Secondary | ICD-10-CM | POA: Diagnosis not present

## 2016-06-18 DIAGNOSIS — Z87891 Personal history of nicotine dependence: Secondary | ICD-10-CM | POA: Diagnosis not present

## 2016-06-18 DIAGNOSIS — T18108A Unspecified foreign body in esophagus causing other injury, initial encounter: Secondary | ICD-10-CM | POA: Diagnosis not present

## 2016-06-18 DIAGNOSIS — E559 Vitamin D deficiency, unspecified: Secondary | ICD-10-CM | POA: Diagnosis not present

## 2016-06-18 DIAGNOSIS — K222 Esophageal obstruction: Secondary | ICD-10-CM | POA: Diagnosis not present

## 2016-06-18 DIAGNOSIS — I10 Essential (primary) hypertension: Secondary | ICD-10-CM | POA: Diagnosis not present

## 2016-06-18 DIAGNOSIS — K209 Esophagitis, unspecified: Secondary | ICD-10-CM | POA: Diagnosis not present

## 2016-06-18 DIAGNOSIS — G629 Polyneuropathy, unspecified: Secondary | ICD-10-CM | POA: Diagnosis not present

## 2016-06-18 DIAGNOSIS — G20 Parkinson's disease: Secondary | ICD-10-CM | POA: Diagnosis not present

## 2016-06-18 DIAGNOSIS — K2980 Duodenitis without bleeding: Secondary | ICD-10-CM | POA: Diagnosis not present

## 2016-06-18 DIAGNOSIS — R1310 Dysphagia, unspecified: Secondary | ICD-10-CM | POA: Diagnosis not present

## 2016-06-18 DIAGNOSIS — Z79899 Other long term (current) drug therapy: Secondary | ICD-10-CM | POA: Diagnosis not present

## 2016-06-18 DIAGNOSIS — N184 Chronic kidney disease, stage 4 (severe): Secondary | ICD-10-CM | POA: Diagnosis not present

## 2016-06-18 DIAGNOSIS — T18128A Food in esophagus causing other injury, initial encounter: Secondary | ICD-10-CM | POA: Diagnosis not present

## 2016-06-18 DIAGNOSIS — K2950 Unspecified chronic gastritis without bleeding: Secondary | ICD-10-CM | POA: Diagnosis not present

## 2016-06-18 DIAGNOSIS — K2931 Chronic superficial gastritis with bleeding: Secondary | ICD-10-CM | POA: Diagnosis not present

## 2016-06-18 LAB — BASIC METABOLIC PANEL
BUN: 35 mg/dL — AB (ref 4–21)
Creatinine: 2.6 mg/dL — AB (ref 0.6–1.3)
GLUCOSE: 91 mg/dL
POTASSIUM: 5.3 mmol/L (ref 3.4–5.3)
SODIUM: 145 mmol/L (ref 137–147)

## 2016-06-24 DIAGNOSIS — I10 Essential (primary) hypertension: Secondary | ICD-10-CM | POA: Diagnosis not present

## 2016-06-24 DIAGNOSIS — N183 Chronic kidney disease, stage 3 (moderate): Secondary | ICD-10-CM | POA: Diagnosis not present

## 2016-06-24 DIAGNOSIS — N390 Urinary tract infection, site not specified: Secondary | ICD-10-CM | POA: Diagnosis not present

## 2016-06-30 ENCOUNTER — Encounter: Payer: Self-pay | Admitting: Internal Medicine

## 2016-07-07 DIAGNOSIS — G20 Parkinson's disease: Secondary | ICD-10-CM | POA: Diagnosis not present

## 2016-07-07 DIAGNOSIS — G6289 Other specified polyneuropathies: Secondary | ICD-10-CM | POA: Diagnosis not present

## 2016-07-15 DIAGNOSIS — N183 Chronic kidney disease, stage 3 (moderate): Secondary | ICD-10-CM | POA: Diagnosis not present

## 2016-07-15 DIAGNOSIS — I10 Essential (primary) hypertension: Secondary | ICD-10-CM | POA: Diagnosis not present

## 2016-07-15 LAB — BASIC METABOLIC PANEL
BUN: 24 mg/dL — AB (ref 4–21)
Creatinine: 2.1 mg/dL — AB (ref 0.6–1.3)
Glucose: 104 mg/dL
Potassium: 4.7 mmol/L (ref 3.4–5.3)
SODIUM: 144 mmol/L (ref 137–147)

## 2016-07-20 DIAGNOSIS — N390 Urinary tract infection, site not specified: Secondary | ICD-10-CM | POA: Diagnosis not present

## 2016-07-20 DIAGNOSIS — N183 Chronic kidney disease, stage 3 (moderate): Secondary | ICD-10-CM | POA: Diagnosis not present

## 2016-07-20 DIAGNOSIS — R809 Proteinuria, unspecified: Secondary | ICD-10-CM | POA: Diagnosis not present

## 2016-07-20 DIAGNOSIS — I10 Essential (primary) hypertension: Secondary | ICD-10-CM | POA: Diagnosis not present

## 2016-07-27 ENCOUNTER — Encounter: Payer: Self-pay | Admitting: Internal Medicine

## 2016-08-18 DIAGNOSIS — N390 Urinary tract infection, site not specified: Secondary | ICD-10-CM | POA: Diagnosis not present

## 2016-08-18 DIAGNOSIS — N183 Chronic kidney disease, stage 3 (moderate): Secondary | ICD-10-CM | POA: Diagnosis not present

## 2016-08-18 DIAGNOSIS — I10 Essential (primary) hypertension: Secondary | ICD-10-CM | POA: Diagnosis not present

## 2016-08-18 LAB — BASIC METABOLIC PANEL
BUN: 21 mg/dL (ref 4–21)
CREATININE: 2.1 mg/dL — AB (ref 0.6–1.3)
GLUCOSE: 85 mg/dL
POTASSIUM: 4.4 mmol/L (ref 3.4–5.3)
Sodium: 143 mmol/L (ref 137–147)

## 2016-08-23 ENCOUNTER — Ambulatory Visit: Payer: Medicare Other | Admitting: Surgery

## 2016-08-23 ENCOUNTER — Encounter: Payer: Self-pay | Admitting: Internal Medicine

## 2016-08-26 DIAGNOSIS — I10 Essential (primary) hypertension: Secondary | ICD-10-CM | POA: Diagnosis not present

## 2016-08-26 DIAGNOSIS — N184 Chronic kidney disease, stage 4 (severe): Secondary | ICD-10-CM | POA: Diagnosis not present

## 2016-08-26 DIAGNOSIS — R609 Edema, unspecified: Secondary | ICD-10-CM | POA: Diagnosis not present

## 2016-08-26 DIAGNOSIS — N183 Chronic kidney disease, stage 3 (moderate): Secondary | ICD-10-CM | POA: Diagnosis not present

## 2016-08-26 LAB — BASIC METABOLIC PANEL
BUN: 26 mg/dL — AB (ref 4–21)
CREATININE: 2.4 mg/dL — AB (ref 0.6–1.3)
GLUCOSE: 95 mg/dL
Potassium: 4.4 mmol/L (ref 3.4–5.3)
Sodium: 144 mmol/L (ref 137–147)

## 2016-08-31 ENCOUNTER — Ambulatory Visit
Admission: RE | Admit: 2016-08-31 | Discharge: 2016-08-31 | Disposition: A | Discharge status: Home / Self Care | Payer: Medicare Other | Source: Ambulatory Visit | Attending: Surgery | Admitting: Surgery

## 2016-08-31 DIAGNOSIS — I714 Abdominal aortic aneurysm, without rupture, unspecified: Secondary | ICD-10-CM

## 2016-09-01 DIAGNOSIS — I10 Essential (primary) hypertension: Secondary | ICD-10-CM | POA: Diagnosis not present

## 2016-09-01 DIAGNOSIS — N183 Chronic kidney disease, stage 3 (moderate): Secondary | ICD-10-CM | POA: Diagnosis not present

## 2016-09-01 DIAGNOSIS — N390 Urinary tract infection, site not specified: Secondary | ICD-10-CM | POA: Diagnosis not present

## 2016-09-02 ENCOUNTER — Encounter: Payer: Self-pay | Admitting: Surgery

## 2016-09-06 ENCOUNTER — Ambulatory Visit (INDEPENDENT_AMBULATORY_CARE_PROVIDER_SITE_OTHER): Payer: Medicare Other | Admitting: Surgery

## 2016-09-06 ENCOUNTER — Ambulatory Visit: Payer: Medicare Other | Admitting: Surgery

## 2016-09-06 DIAGNOSIS — I714 Abdominal aortic aneurysm, without rupture, unspecified: Secondary | ICD-10-CM

## 2016-09-06 NOTE — Progress Notes (Signed)
[facility] [place]  Patient name: [name] MRN: [id number] DOB: 1927-03-28 Sex: male  REASON FOR VISIT: Follow up AAA  HPI: The patient is back today for follow-up of his abdominal aortic aneurysm which was repaired via an endovascular approach on 10/04/2008. Prior to that he had undergone right nephrectomy secondary to renal cell tumor.  He is down to approximately 20% function of his solitary kidney.  I have been following him with serial noncontrast CT scans as we have had difficulty getting good ultrasound images. He reports no abdominal pain or back pain.  Past Medical History:  Diagnosis Date  . Abdominal aortic aneurysm ([place]) 09/2008   s/o endovascular repair and angiogram 10/04/08   . Anemia    EGD Cscope 08-2012  . Bleeding ulcer 1997  . CKD (chronic kidney disease), stage IV (HCC)    Stable, Dr. Dimas Aguas  . COPD (chronic obstructive pulmonary disease) ([place])   . H/O hyperkalemia    Stable  . Hypertension   . Leukopenia 2/11   thought to be from doxy  . Renal cell carcinoma    clear cell type dx 10/09, s/p excision 10-24-08  . Renal insufficiency   . Solitary left kidney    W/ chronic tubule-interstitial damage, Dr. Dimas Aguas  . Spinal stenosis    causing LE weakness-persistant, being worked up by Hydrographic surveyor and neurologist    Family History  Problem Relation Age of Onset  . Diabetes Father     ?  . Stroke Father   . Hypertension Father   . Colon cancer Neg Hx   . Prostate cancer Neg Hx     SOCIAL HISTORY: Social History  Substance Use Topics  . Smoking status: Former Smoker    Quit date: 12/28/2007  . Smokeless tobacco: Never Used     Comment: used to smoke 1.5 ppd  . Alcohol use No    Allergies  Allergen Reactions  . Baclofen Other (See Comments)    Unable to walk per Pt's family   . Iodinated Diagnostic Agents     Only one kidney   . Ioxaglate     Only one kidney     Current  Outpatient Prescriptions  Medication Sig Dispense Refill  . Casanthranol-Docusate Sodium 30-100 MG CAPS Take by mouth.      . chlorthalidone (HYGROTON) 25 MG tablet Take 1 tablet (25 mg total) by mouth daily. 30 tablet 5  . Cholecalciferol (VITAMIN D) 2000 units CAPS Take 2,000 Units by mouth daily.    Marland Kitchen donepezil (ARICEPT) 5 MG tablet Take 5 mg by mouth at bedtime.    . FeFum-FePo-FA-B Cmp-C-Zn-Mn-Cu (SE-TAN PLUS) 162-115.2-1 MG CAPS Take 1 tablet by mouth daily. Take 1 Tab  daily    . hydrALAZINE (APRESOLINE) 25 MG tablet Take 1 tablet (25 mg total) by mouth 2 (two) times daily. (Patient taking differently: Take 25 mg by mouth 3 (three) times daily. ) 180 tablet 1  . hydrochlorothiazide (HYDRODIURIL) 25 MG tablet Take 25 mg by mouth daily.    Marland Kitchen labetalol (NORMODYNE) 100 MG tablet Take 1 tablet (100 mg total) by mouth 2 (two) times daily. 60 tablet 6  . pregabalin (LYRICA) 50 MG capsule Take 50 mg by mouth 3 (three) times daily.    . sodium polystyrene (KAYEXALATE) 15 GM/60ML suspension Take 15 g by mouth once.    Marland Kitchen albuterol (PROVENTIL HFA;VENTOLIN HFA) 108 (90 BASE) MCG/ACT inhaler Inhale 2 puffs into the lungs every 6 (six) hours  as needed for wheezing or shortness of breath. (Patient not taking: Reported on 09/06/2016) 1 Inhaler 0  . gabapentin (NEURONTIN) 100 MG capsule Take 100 mg by mouth 3 (three) times daily.    . mometasone (NASONEX) 50 MCG/ACT nasal spray 2 sprays each nares q day (Patient not taking: Reported on 09/06/2016) 17 g 0  . sertraline (ZOLOFT) 25 MG tablet Take 25 mg by mouth 2 (two) times daily.     No current facility-administered medications for this visit.     REVIEW OF SYSTEMS:  [X]  denotes positive finding, [ ]  denotes negative finding Cardiac  Comments:  Chest pain or chest pressure:    Shortness of breath upon exertion:    Short of breath when lying flat:    Irregular heart rhythm:        Vascular    Pain in calf, thigh, or hip brought on by ambulation:      Pain in feet at night that wakes you up from your sleep:     Blood clot in your veins:    Leg swelling:  x       Pulmonary    Oxygen at home:    Productive cough:     Wheezing:         Neurologic    Sudden weakness in arms or legs:     Sudden numbness in arms or legs:     Sudden onset of difficulty speaking or slurred speech:    Temporary loss of vision in one eye:     Problems with dizziness:         Gastrointestinal    Blood in stool:     Vomited blood:         Genitourinary    Burning when urinating:     Blood in urine:        Psychiatric    Major depression:         Hematologic    Bleeding problems:    Problems with blood clotting too easily:        Skin    Rashes or ulcers:        Constitutional    Fever or chills:      PHYSICAL EXAM: There were no vitals filed for this visit.  GENERAL: The patient is a well-nourished male, in no acute distress. The vital signs are documented above. CARDIAC: There is a regular rate and rhythm.  PULMONARY: There is good air exchange bilaterally without wheezing or rales. ABDOMEN: Soft and non-tender with normal pitched bowel sounds.  MUSCULOSKELETAL: There are no major deformities or cyanosis. NEUROLOGIC: No focal weakness or paresthesias are detected. SKIN: There are no ulcers or rashes noted. PSYCHIATRIC: The patient has a normal affect.  DATA:  I have reviewed his CT scan.  This shows a maximum aortic diameter of 6.3 cm which is slightly increased from 1 year ago where it measured 6.1 cm.  MEDICAL ISSUES: Abdominal aortic aneurysm: There has been slight enlargement of the aneurysm sac on CT scan.  I have elected to continue to follow this.  He'll have another noncontrasted CT scan in 6 months.  I discussed with the patient and daughter that any attempts to get more information regarding the possibility of an endoleak would potentially cause him to suffer from renal failure needing dialysis.  Therefore we will continue  with surveillance.  If this continues to grow, I may need to give him contrast to determine the cause of sac enlargement.  Annamarie Major, MD [facility] [phone] Pager [phone]

## 2016-09-07 NOTE — Addendum Note (Signed)
Addended by: Thresa Ross C on: 09/07/2016 09:00 AM   Modules accepted: Orders

## 2016-09-13 ENCOUNTER — Encounter: Payer: Self-pay | Admitting: Internal Medicine

## 2016-09-14 ENCOUNTER — Ambulatory Visit (INDEPENDENT_AMBULATORY_CARE_PROVIDER_SITE_OTHER): Payer: Medicare Other | Admitting: Internal Medicine

## 2016-09-14 ENCOUNTER — Other Ambulatory Visit: Payer: Self-pay | Admitting: Internal Medicine

## 2016-09-14 ENCOUNTER — Encounter: Payer: Self-pay | Admitting: Internal Medicine

## 2016-09-14 VITALS — BP 128/62 | HR 53 | Temp 97.5°F | Resp 14 | Ht 68.0 in | Wt 165.1 lb

## 2016-09-14 DIAGNOSIS — J449 Chronic obstructive pulmonary disease, unspecified: Secondary | ICD-10-CM

## 2016-09-14 DIAGNOSIS — Z23 Encounter for immunization: Secondary | ICD-10-CM

## 2016-09-14 DIAGNOSIS — G629 Polyneuropathy, unspecified: Secondary | ICD-10-CM | POA: Diagnosis not present

## 2016-09-14 DIAGNOSIS — I10 Essential (primary) hypertension: Secondary | ICD-10-CM

## 2016-09-14 NOTE — Progress Notes (Signed)
Subjective:    Patient ID: [name], male    DOB: 1927/04/30, 82 y.o.   MRN: [id number]  DOS:  09/14/2016 Type of visit - description : roc, here w/ [name] Interval history: In general feeling well. Good compliance with medication. Neuropathy still an issue, was switch to Lyrica, still symptomatic Depression? Was on Zoloft, it was d/c , reports mood is  good.    Review of Systems  No recent chest pain or difficulty breathing. No cough  Past Medical History:  Diagnosis Date  . Abdominal aortic aneurysm ([place]) 09/2008   s/o endovascular repair and angiogram 10/04/08   . Anemia    EGD Cscope 08-2012  . Bleeding ulcer 1997  . CKD (chronic kidney disease), stage IV (HCC)    Stable, Dr. Dimas Aguas  . COPD (chronic obstructive pulmonary disease) ([place])   . H/O hyperkalemia    Stable  . Hypertension   . Leukopenia 2/11   thought to be from doxy  . Renal cell carcinoma    clear cell type dx 10/09, s/p excision 10-24-08  . Renal insufficiency   . Solitary left kidney    W/ chronic tubule-interstitial damage, Dr. Dimas Aguas  . Spinal stenosis    causing LE weakness-persistant, being worked up by Hydrographic surveyor and neurologist    Past Surgical History:  Procedure Laterality Date  . ABDOMINAL AORTIC ANEURYSM REPAIR  2009   EVAR  . [place]  . [place] 2004  . NEPHRECTOMY  2009   Open nephrectomy for cancer    Social History   Social History  . Marital status: Widowed    Spouse name: N/A  . Number of children: 3  . Years of education: N/A   Occupational History  . retired Retired   Social History Main Topics  . Smoking status: Former Smoker    Quit date: 12/28/2007  . Smokeless tobacco: Never Used     Comment: used to smoke 1.5 ppd  . Alcohol use No  . Drug use: No  . Sexual activity: Not on file   Other Topics Concern  . Not on file   Social History Narrative   Lost wife to dementia 10-2015   Totally independent on  ADL , no driving   Often came to office w/ his daughter July [phone]), his other daughter is  [name] , son is Martavious                           Medication List       Accurate as of 09/14/16  8:53 AM. Always use your most recent med list.          albuterol 108 (90 Base) MCG/ACT inhaler Commonly known as:  PROVENTIL HFA;VENTOLIN HFA Inhale 2 puffs into the lungs every 6 (six) hours as needed for wheezing or shortness of breath.   Casanthranol-Docusate Sodium 30-100 MG Caps Take by mouth.   chlorthalidone 25 MG tablet Commonly known as:  HYGROTON Take 1 tablet (25 mg total) by mouth daily.   donepezil 5 MG tablet Commonly known as:  ARICEPT Take 5 mg by mouth at bedtime.   gabapentin 100 MG capsule Commonly known as:  NEURONTIN Take 100 mg by mouth 3 (three) times daily.   hydrALAZINE 25 MG tablet Commonly known as:  APRESOLINE Take 1 tablet (25 mg total) by mouth 2 (two) times daily.   hydrochlorothiazide 25 MG tablet  Commonly known as:  HYDRODIURIL Take 25 mg by mouth daily.   labetalol 100 MG tablet Commonly known as:  NORMODYNE Take 1 tablet (100 mg total) by mouth 2 (two) times daily.   mometasone 50 MCG/ACT nasal spray Commonly known as:  NASONEX 2 sprays each nares q day   pregabalin 50 MG capsule Commonly known as:  LYRICA Take 50 mg by mouth 3 (three) times daily.   SE-TAN PLUS 162-115.2-1 MG Caps Take 1 tablet by mouth daily. Take 1 Tab  daily   sertraline 25 MG tablet Commonly known as:  ZOLOFT Take 25 mg by mouth 2 (two) times daily.   sodium polystyrene 15 GM/60ML suspension Commonly known as:  KAYEXALATE Take 15 g by mouth 2 (two) times a week.   Vitamin D 2000 units Caps Take 2,000 Units by mouth daily.          Objective:   Physical Exam BP 128/62 (BP Location: Left Arm, Patient Position: Sitting, Cuff Size: Small)   Pulse (!) 53   Temp 97.5 F (36.4 C) (Oral)   Resp 14   Ht 5\' 8"  (1.727 m)   Wt 165 lb 2 oz (74.9 kg)    SpO2 97%   BMI 25.11 kg/m  General:   Well developed, well nourished . NAD.  HEENT:  Normocephalic . Face symmetric, atraumatic Lungs:  Decreased breath sounds Normal respiratory effort, no intercostal retractions, no accessory muscle use. Heart: RRR,  no murmur.  No pretibial edema bilaterally  Skin: Not pale. Not jaundice Neurologic:  alert & oriented X3.  Speech normal, gait appropriate for age and unassisted Psych--  Cooperative with normal attention span and concentration.  Behavior appropriate. No anxious or depressed appearing.      Assessment & Plan:  Assessment  Hypertension, COPD--former smoker,PFTs 8- 2013 mild obstruction otherwise normal.Asx  Renal: --H/o Renal Cancer  --Solitary left kidney --Chronic renal insufficiency Neuro:  neuropathy,  Saw neuro 06-2015, Dr Posey Pronto. As off 03-2016 is followed at [facility]:  Dr. Blair Promise gen neuro, Dr Tonye Royalty (movement d/o specialist)   MSK: --DJD --Mild spinal stenosis, has seen  Dr Flavia Shipper Osteopenia--DEXA 07-2015, osteopenia, rx vit D History of leukopenia -- sees hem-onc, 2011 felt to be from doxycycline AAA s/p EVAR 2009 Acne rosacea  Plan: HTN: Controlled, continue HCTZ, hydralazine, labetalol. Follow-up by renal. COPD: Asx Neuropathy: Follow-up by neurology. Still an issue. Would like to redo physical therapy (it helped before) and use the office located in this building, will call when ready for a referral. Primary care: Flu shot today. RTC 8 months (he sees a number of specialists and get blood work elsewhere)

## 2016-09-14 NOTE — Assessment & Plan Note (Signed)
HTN: Controlled, continue HCTZ, hydralazine, labetalol. Follow-up by renal. COPD: Asx Neuropathy: Follow-up by neurology. Still an issue. Would like to redo physical therapy (it helped before) and use the office located in this building, will call when ready for a referral. Primary care: Flu shot today. RTC 8 months (he sees a number of specialists and get blood work elsewhere)

## 2016-09-14 NOTE — Progress Notes (Signed)
Pre visit review using our clinic review tool, if applicable. No additional management support is needed unless otherwise documented below in the visit note. 

## 2016-09-29 DIAGNOSIS — D631 Anemia in chronic kidney disease: Secondary | ICD-10-CM | POA: Diagnosis not present

## 2016-09-29 DIAGNOSIS — Z79899 Other long term (current) drug therapy: Secondary | ICD-10-CM | POA: Diagnosis not present

## 2016-09-29 DIAGNOSIS — Z87891 Personal history of nicotine dependence: Secondary | ICD-10-CM | POA: Diagnosis not present

## 2016-09-29 DIAGNOSIS — D696 Thrombocytopenia, unspecified: Secondary | ICD-10-CM | POA: Diagnosis not present

## 2016-09-29 DIAGNOSIS — D61818 Other pancytopenia: Secondary | ICD-10-CM | POA: Diagnosis not present

## 2016-09-29 DIAGNOSIS — N189 Chronic kidney disease, unspecified: Secondary | ICD-10-CM | POA: Diagnosis not present

## 2016-09-29 DIAGNOSIS — D649 Anemia, unspecified: Secondary | ICD-10-CM | POA: Diagnosis not present

## 2016-09-29 LAB — CBC AND DIFFERENTIAL
HCT: 33 % — AB (ref 41–53)
Hemoglobin: 10.4 g/dL — AB (ref 13.5–17.5)
NEUTROS ABS: 2 /uL
PLATELETS: 78 10*3/uL — AB (ref 150–399)
WBC: 2.9 10^3/mL

## 2016-10-05 ENCOUNTER — Telehealth: Payer: Self-pay | Admitting: Internal Medicine

## 2016-10-05 DIAGNOSIS — M159 Polyosteoarthritis, unspecified: Secondary | ICD-10-CM

## 2016-10-05 DIAGNOSIS — M4800 Spinal stenosis, site unspecified: Secondary | ICD-10-CM

## 2016-10-05 NOTE — Telephone Encounter (Signed)
Please advise 

## 2016-10-05 NOTE — Telephone Encounter (Signed)
Ok to refer, DX--- DJD, spinal stenosis

## 2016-10-05 NOTE — Telephone Encounter (Signed)
Patients [name] called requesting referral to physical therapy for the office at [place]. [phone]

## 2016-10-05 NOTE — Telephone Encounter (Signed)
Referral placed.

## 2016-10-07 ENCOUNTER — Ambulatory Visit: Payer: Medicare Other | Attending: Internal Medicine | Admitting: Physical Therapy

## 2016-10-07 DIAGNOSIS — N183 Chronic kidney disease, stage 3 (moderate): Secondary | ICD-10-CM | POA: Diagnosis not present

## 2016-10-07 DIAGNOSIS — R279 Unspecified lack of coordination: Secondary | ICD-10-CM | POA: Diagnosis not present

## 2016-10-07 DIAGNOSIS — M6281 Muscle weakness (generalized): Secondary | ICD-10-CM | POA: Diagnosis not present

## 2016-10-07 DIAGNOSIS — R262 Difficulty in walking, not elsewhere classified: Secondary | ICD-10-CM

## 2016-10-07 DIAGNOSIS — M5442 Lumbago with sciatica, left side: Secondary | ICD-10-CM | POA: Insufficient documentation

## 2016-10-07 DIAGNOSIS — R2681 Unsteadiness on feet: Secondary | ICD-10-CM | POA: Insufficient documentation

## 2016-10-07 DIAGNOSIS — R2689 Other abnormalities of gait and mobility: Secondary | ICD-10-CM | POA: Diagnosis not present

## 2016-10-07 DIAGNOSIS — G8929 Other chronic pain: Secondary | ICD-10-CM | POA: Diagnosis not present

## 2016-10-07 DIAGNOSIS — I10 Essential (primary) hypertension: Secondary | ICD-10-CM | POA: Diagnosis not present

## 2016-10-07 LAB — BASIC METABOLIC PANEL
BUN: 29 mg/dL — AB (ref 4–21)
CREATININE: 2.5 mg/dL — AB (ref 0.6–1.3)
Glucose: 96 mg/dL
POTASSIUM: 4.9 mmol/L (ref 3.4–5.3)
SODIUM: 146 mmol/L (ref 137–147)

## 2016-10-08 NOTE — Therapy (Signed)
[facility] [address]  [place], [place], [address] Phone: [phone]   Fax:  [phone]  Physical Therapy Evaluation  Patient Details  Name: [name] MRN: [id number] Date of Birth: March 26, 1927 Referring Provider: Kathlene November, MD  Encounter Date: 10/07/2016      PT End of Session - 10/07/16 1405    Visit Number 1   Number of Visits 16   Date for PT Re-Evaluation 12/03/16   Authorization Type Medicare/BCBS   PT Start Time 1315   PT Stop Time 1405   PT Time Calculation (min) 50 min   Activity Tolerance Patient tolerated treatment well   Behavior During Therapy [facility] for tasks assessed/performed      Past Medical History:  Diagnosis Date  . Abdominal aortic aneurysm ([place]) 09/2008   s/o endovascular repair and angiogram 10/04/08   . Anemia    EGD Cscope 08-2012  . Bleeding ulcer 1997  . CKD (chronic kidney disease), stage IV (HCC)    Stable, Dr. Dimas Aguas  . COPD (chronic obstructive pulmonary disease) ([place])   . H/O hyperkalemia    Stable  . Hypertension   . Leukopenia 2/11   thought to be from doxy  . Renal cell carcinoma    clear cell type dx 10/09, s/p excision 10-24-08  . Renal insufficiency   . Solitary left kidney    W/ chronic tubule-interstitial damage, Dr. Dimas Aguas  . Spinal stenosis    causing LE weakness-persistant, being worked up by Hydrographic surveyor and neurologist    Past Surgical History:  Procedure Laterality Date  . ABDOMINAL AORTIC ANEURYSM REPAIR  2009   EVAR  . [place]  . [place] 2004  . NEPHRECTOMY  2009   Open nephrectomy for cancer    There were no vitals filed for this visit.       Subjective Assessment - 10/07/16 1320    Subjective Pt returning to PT due to worsening of back pain, slowing gait and worsening of balance.   Pertinent History OA of multiple joints, spinal stenosis   Limitations Standing;Walking   How long  can you stand comfortably? 1 minute   How long can you walk comfortably? 1/4 mile   Patient Stated Goals "less pain in back"   Currently in Pain? Yes   Pain Score 5   Least 3/10, Avg 5/10, Worst 6/10   Pain Location Back   Pain Orientation Left;Lower;Lateral   Pain Descriptors / Indicators Shooting   Pain Type Acute pain;Chronic pain   Pain Radiating Towards back of L leg to mid thigh   Pain Onset More than a month ago   Pain Frequency Intermittent   Aggravating Factors  unpredictable   Pain Relieving Factors pressure   Effect of Pain on Daily Activities unsure - states "I try to tough it out"            [facility] PT Assessment - 10/07/16 1315      Assessment   Medical Diagnosis Low back pain d/t OA & spinal stenosis, Balance impairment   Referring Provider Kathlene November, MD   Onset Date/Surgical Date --  ~3 months   Next MD Visit 05/12/17     Balance Screen   Has the patient fallen in the past 6 months No   Has the patient had a decrease in activity level because of a fear of falling?  Yes   Is the patient  reluctant to leave their home because of a fear of falling?  Yes     [place] residence   Living Arrangements Alone   Type of [place] to enter   Entrance Stairs-Number of Steps 1   Entrance Stairs-Rails None  can hold onto door   Home Layout One level   [place] - single point;Grab bars - toilet;Grab bars - tub/shower;Shower seat  does not use cane or shower seat currently     Prior Function   Level of Independence Independent   Vocation Retired   Leisure walking, working out at gym     Observation/Other Assessments   Focus on Therapeutic Outcomes (FOTO)  Lumbar spine 58% (42% limitation); predicted 64% (36% limitation)     Posture/Postural Control   Posture/Postural Control Postural limitations   Postural Limitations Forward head;Rounded Shoulders;Increased thoracic kyphosis;Decreased lumbar  lordosis;Flexed trunk     ROM / Strength   AROM / PROM / Strength Strength;AROM     AROM   AROM Assessment Site Lumbar   Lumbar Flexion hands to upper shins   Lumbar Extension 20%   Lumbar - Right Side Bend hand to mid thigh   Lumbar - Left Side Bend hand to mid thigh   Lumbar - Right Rotation 40%   Lumbar - Left Rotation 30%     Strength   Strength Assessment Site Hip;Knee;Ankle   Right/Left Hip Right;Left   Right Hip Flexion 4/5   Right Hip Extension 4/5   Right Hip ABduction 4+/5   Right Hip ADduction 4+/5   Left Hip Flexion 4/5   Left Hip Extension 4/5   Left Hip ABduction 4+/5   Left Hip ADduction 4+/5   Right/Left Knee Right;Left   Right Knee Flexion 4/5   Right Knee Extension 4+/5   Left Knee Flexion 4/5   Left Knee Extension 4+/5   Right/Left Ankle Right;Left   Right Ankle Dorsiflexion 4/5   Right Ankle Plantar Flexion 4+/5   Left Ankle Dorsiflexion 4/5   Left Ankle Plantar Flexion 4+/5     Ambulation/Gait   Gait Pattern Wide base of support;Decreased stride length;Decreased step length - left;Decreased stance time - right;Poor foot clearance - left;Poor foot clearance - right;Trunk flexed   Gait velocity 1.94 ft/sec     Standardized Balance Assessment   Standardized Balance Assessment Berg Balance Test;Timed Up and Go Test;10 meter walk test   10 Meter Walk 1.94 ft/sec  16.88 sec     Berg Balance Test   Sit to Stand Able to stand  independently using hands   Standing Unsupported Able to stand safely 2 minutes   Sitting with Back Unsupported but Feet Supported on Floor or Stool Able to sit safely and securely 2 minutes   Stand to Sit Sits safely with minimal use of hands   Transfers Able to transfer safely, minor use of hands   Standing Unsupported with Eyes Closed Able to stand 10 seconds with supervision   Standing Ubsupported with Feet Together Able to place feet together independently and stand for 1 minute with supervision   From Standing, Reach  Forward with Outstretched Arm Can reach forward >5 cm safely (2")   From Standing Position, Pick up Object from Floor Unable to pick up shoe, but reaches 2-5 cm (1-2") from shoe and balances independently   From Standing Position, Turn to Look Behind Over each Shoulder Turn sideways only but maintains balance  Turn 360 Degrees Needs close supervision or verbal cueing   Standing Unsupported, Alternately Place Feet on Step/Stool Able to stand independently and complete 8 steps >20 seconds   Standing Unsupported, One Foot in [place] to take small step independently and hold 30 seconds   Standing on One Leg Tries to lift leg/unable to hold 3 seconds but remains standing independently   Total Score 38     Timed Up and Go Test   TUG Normal TUG   Normal TUG (seconds) 17.06                 PT Short Term Goals - 10/07/16 1405      PT SHORT TERM GOAL #1   Title Pt will be independent with lumbar HEP by 10/29/16   Status New           PT Long Term Goals - 10/07/16 1405      PT LONG TERM GOAL #1   Title Pt will be independent with advanced lumbar +/- balance HEP by 12/03/16   Status New     PT LONG TERM GOAL #2   Title B LE strength to >/= 4=/5 for improved function by 12/03/16   Status New     PT LONG TERM GOAL #3   Title Pt will demonstrate improvement on the Berg Balance Scale to 49/56 or greater to reduce risk for falls by 12/03/16   Status New     PT LONG TERM GOAL #4   Title Pt will improve gait speed to 2.5 ft/sec or greater for improved safety with community ambulation by 12/03/16   Status New               Plan - 10/07/16 1405    Clinical Impression Statement [name] is an 82 y/o male who presents to OP PT for a moderate complexity eval for low back pain from osteoarthritis and lumbar spinal stenosis along with worsening of balance and gait stability and decreasing activity tolerance. Pt completed a prior episode of PT for worsening of balance and coordination  along with decreasing activity tolerance secondary to increasing sense of heaviness and tired feelings in his legs from lumbar stenosis and Parkinson's from April to June 2017. Since this time, he has demonstrated a slight decline in LE strength especially in the hips, a decrease in gait speed from 2.54 ft/sec to 1.94 ft/sec, and a decline in balance per the Berg to 38/56 from 51/56 indicating a significant risk (>80%) for falls. Pt also reporting increase in L LBP with radicular pain down back of leg to mid thigh, currently 5/10, with least pain reported at 3/10, avg pain 5/10 and worst pain 6/10. Due to this increasing pain, weakness, gait unsteadiness and lack of balance, pt reports not able to participate in the activities he normally enjoys such as walking on the treadmill and doing his exercises. POC will focus on core/proximal LE strengthening to improve posture and lumbar support as well as balance, along with balance and dynamic gait activities to decrease risk for falls.   Rehab Potential Good   Clinical Impairments Affecting Rehab Potential Advanced age, HTN, COPD, CKD, LE neuropathy   PT Frequency 2x / week   PT Duration 8 weeks   PT Treatment/Interventions Patient/family education;Neuromuscular re-education;Therapeutic exercise;Manual techniques;Therapeutic activities;Functional mobility training;Balance training;Gait training;Moist Heat;Cryotherapy;Electrical Stimulation;ADLs/Self Care Home Management   PT Next Visit Plan Review prior HEP and modify/update as indicated; Lumbar stabilization/strengthening; Balance/coordination training; Manual therapy &/or modalities PRN for pain  Consulted and Agree with Plan of Care Patient;Family member/caregiver   Family Member Consulted Dtr - [name]      Patient will benefit from skilled therapeutic intervention in order to improve the following deficits and impairments:  Pain, Postural dysfunction, Decreased strength, Decreased activity tolerance,  Difficulty walking, Decreased balance, Abnormal gait, Decreased coordination, Decreased endurance  Visit Diagnosis: Chronic left-sided low back pain with left-sided sciatica  Unsteadiness on feet  Unspecified lack of coordination  Difficulty in walking, not elsewhere classified  Other abnormalities of gait and mobility  Muscle weakness (generalized)      G-Codes - 10/27/16 1405    Functional Assessment Tool Used Lumbar Spine FOTO = 58% (42% limitation), Berg = 38/56, & clinical judgement    Functional Limitation Mobility: Walking and moving around   Mobility: Walking and Moving Around Current Status [phone]) At least 40 percent but less than 60 percent impaired, limited or restricted   Mobility: Walking and Moving Around Goal Status [phone]) At least 20 percent but less than 40 percent impaired, limited or restricted       Problem List Patient Active Problem List   Diagnosis Date Noted  . Follow-up --- PCP NOTES 09/02/2015  . Neuropathy ([place]) 06/10/2015  . Pedal edema 01/30/2015  . Hip pain, chronic 01/30/2015  . Back pain 01/30/2015  . Cramps of lower extremity 01/30/2015  . Aftercare following surgery of the circulatory system, [place] 08/05/2014  . Annual physical exam 10/19/2011  . DJD (degenerative joint disease) 12/14/2010  . Anemia 06/06/2009  . COPD (chronic obstructive pulmonary disease) ([place]) 06/06/2009  . RENAL CELL CANCER 12/25/2008  . HTN (hypertension) 12/25/2008  . AAA (abdominal aortic aneurysm) without rupture ([place]) 12/25/2008  . CKD (chronic kidney disease) 12/25/2008  . HYPERCHOLESTEROLEMIA 03/01/2008  . GERD 03/01/2008  . ACNE ROSACEA 11/29/2007  . DIVERTICULOSIS, COLON 04/12/2007    Percival Spanish, PT, MPT 10/08/2016, 8:28 AM  [facility] [place], [place], [address] Phone: [phone]   Fax:  [phone]  Name: [name] MRN: [id number] Date of Birth:  04/17/1927

## 2016-10-12 ENCOUNTER — Ambulatory Visit: Payer: Medicare Other | Admitting: Physical Therapy

## 2016-10-12 DIAGNOSIS — R279 Unspecified lack of coordination: Secondary | ICD-10-CM | POA: Diagnosis not present

## 2016-10-12 DIAGNOSIS — M5442 Lumbago with sciatica, left side: Principal | ICD-10-CM

## 2016-10-12 DIAGNOSIS — R2689 Other abnormalities of gait and mobility: Secondary | ICD-10-CM

## 2016-10-12 DIAGNOSIS — R2681 Unsteadiness on feet: Secondary | ICD-10-CM | POA: Diagnosis not present

## 2016-10-12 DIAGNOSIS — G8929 Other chronic pain: Secondary | ICD-10-CM | POA: Diagnosis not present

## 2016-10-12 DIAGNOSIS — R262 Difficulty in walking, not elsewhere classified: Secondary | ICD-10-CM | POA: Diagnosis not present

## 2016-10-12 DIAGNOSIS — M6281 Muscle weakness (generalized): Secondary | ICD-10-CM

## 2016-10-12 NOTE — Therapy (Signed)
[facility] [address]  [place], [place], [address] Phone: [phone]   Fax:  [phone]  Physical Therapy Treatment  Patient Details  Name: [name] MRN: [id number] Date of Birth: 07-Jan-1927 Referring Provider: Kathlene November, MD  Encounter Date: 10/12/2016      PT End of Session - 10/12/16 1258    Visit Number 2   Number of Visits 16   Date for PT Re-Evaluation 12/03/16   Authorization Type Medicare/BCBS   PT Start Time 1258   PT Stop Time 1348   PT Time Calculation (min) 50 min   Activity Tolerance Patient tolerated treatment well   Behavior During Therapy [facility] for tasks assessed/performed      Past Medical History:  Diagnosis Date  . Abdominal aortic aneurysm ([place]) 09/2008   s/o endovascular repair and angiogram 10/04/08   . Anemia    EGD Cscope 08-2012  . Bleeding ulcer 1997  . CKD (chronic kidney disease), stage IV (HCC)    Stable, Dr. Dimas Aguas  . COPD (chronic obstructive pulmonary disease) ([place])   . H/O hyperkalemia    Stable  . Hypertension   . Leukopenia 2/11   thought to be from doxy  . Renal cell carcinoma    clear cell type dx 10/09, s/p excision 10-24-08  . Renal insufficiency   . Solitary left kidney    W/ chronic tubule-interstitial damage, Dr. Dimas Aguas  . Spinal stenosis    causing LE weakness-persistant, being worked up by Hydrographic surveyor and neurologist    Past Surgical History:  Procedure Laterality Date  . ABDOMINAL AORTIC ANEURYSM REPAIR  2009   EVAR  . [place]  . [place] ([place]), 2004  . NEPHRECTOMY  2009   Open nephrectomy for cancer    There were no vitals filed for this visit.      Subjective Assessment - 10/12/16 1300    Subjective Pt denies back pain today and reports no other concerns.   Patient Stated Goals "less pain in back"   Currently in Pain? No/denies          Today's Treatment  TherEx Treadmill - 1.0 mph  x 6' B HS stretch with strap x30" B SKTC stretch x30" B KTOS piriformis stretch x30" LTR 10x10" Abdominal bracing + Pelvic tilt 10x3" TrA + Hooklying Hip ABD/ER fall-out 10x3" TrA + Hooklying March 10x3" Bridge 10x3' Standing at back of chair:    Alt Hip ABD x20 (x10 each side)    Alt Hip Ext x20    Marching (standing sideways to back of chair) x20    Heel raises x10 Trunk extension into wall for postural awareness 10x5" Seated trunk extension/scapular retraction for improved posture 10x5"             PT Education - 10/12/16 1145    Education provided Yes   Education Details Review of prior low back HEP + addition of standing & posture exercises   Person(s) Educated Patient   Methods Explanation;Demonstration;Handout;Verbal cues;Tactile cues   Comprehension Verbalized understanding;Returned demonstration;Verbal cues required;Tactile cues required;Need further instruction          PT Short Term Goals - 10/12/16 1302      PT SHORT TERM GOAL #1   Title Pt will be independent with lumbar HEP by 10/29/16   Status On-going           PT Long Term Goals - 10/12/16 1302  PT LONG TERM GOAL #1   Title Pt will be independent with advanced lumbar +/- balance HEP by 12/03/16   Status On-going     PT LONG TERM GOAL #2   Title B LE strength to >/= 4=/5 for improved function by 12/03/16   Status On-going     PT LONG TERM GOAL #3   Title Pt will demonstrate improvement on the Berg Balance Scale to 49/56 or greater to reduce risk for falls by 12/03/16   Status On-going     PT LONG TERM GOAL #4   Title Pt will improve gait speed to 2.5 ft/sec or greater for improved safety with community ambulation by 12/03/16   Status On-going               Plan - 10/12/16 1302    Clinical Impression Statement Pt/dtr was to have brought prior HEPs for review but forgot, therefore reviewed low back HEP from prior therapy episode at this clinic with pt requiring  correction/clarification of most stretches and exercises. Also added standing seated/standing postural and LE exercises focusing on good upright posture and neutral spine alignment.   Rehab Potential Good   Clinical Impairments Affecting Rehab Potential Advanced age, HTN, COPD, CKD, LE neuropathy   PT Treatment/Interventions Patient/family education;Neuromuscular re-education;Therapeutic exercise;Manual techniques;Therapeutic activities;Functional mobility training;Balance training;Gait training;Moist Heat;Cryotherapy;Electrical Stimulation;ADLs/Self Care Home Management   PT Next Visit Plan Review prior HEP and modify/update as indicated; Lumbar stabilization/strengthening; Balance/coordination training; Manual therapy &/or modalities PRN for pain   Consulted and Agree with Plan of Care Patient;Family member/caregiver   Family Member Consulted Dtr - [name]      Patient will benefit from skilled therapeutic intervention in order to improve the following deficits and impairments:  Pain, Postural dysfunction, Decreased strength, Decreased activity tolerance, Difficulty walking, Decreased balance, Abnormal gait, Decreased coordination, Decreased endurance  Visit Diagnosis: Chronic left-sided low back pain with left-sided sciatica  Unsteadiness on feet  Unspecified lack of coordination  Difficulty in walking, not elsewhere classified  Other abnormalities of gait and mobility  Muscle weakness (generalized)     Problem List Patient Active Problem List   Diagnosis Date Noted  . Follow-up --- PCP NOTES 09/02/2015  . Neuropathy ([place]) 06/10/2015  . Pedal edema 01/30/2015  . Hip pain, chronic 01/30/2015  . Back pain 01/30/2015  . Cramps of lower extremity 01/30/2015  . Aftercare following surgery of the circulatory system, [place] 08/05/2014  . Annual physical exam 10/19/2011  . DJD (degenerative joint disease) 12/14/2010  . Anemia 06/06/2009  . COPD (chronic obstructive pulmonary disease)  ([place]) 06/06/2009  . RENAL CELL CANCER 12/25/2008  . HTN (hypertension) 12/25/2008  . AAA (abdominal aortic aneurysm) without rupture ([place]) 12/25/2008  . CKD (chronic kidney disease) 12/25/2008  . HYPERCHOLESTEROLEMIA 03/01/2008  . GERD 03/01/2008  . ACNE ROSACEA 11/29/2007  . DIVERTICULOSIS, COLON 04/12/2007    Percival Spanish, PT, MPT 10/12/2016, 3:24 PM  [facility] [address]  [place], [place], [address] Phone: [phone]   Fax:  [phone]  Name: [name] MRN: [id number] Date of Birth: 07/19/1927

## 2016-10-13 ENCOUNTER — Encounter: Payer: Self-pay | Admitting: Internal Medicine

## 2016-10-13 DIAGNOSIS — N183 Chronic kidney disease, stage 3 (moderate): Secondary | ICD-10-CM | POA: Diagnosis not present

## 2016-10-13 DIAGNOSIS — I10 Essential (primary) hypertension: Secondary | ICD-10-CM | POA: Diagnosis not present

## 2016-10-13 DIAGNOSIS — N390 Urinary tract infection, site not specified: Secondary | ICD-10-CM | POA: Diagnosis not present

## 2016-10-14 ENCOUNTER — Ambulatory Visit: Payer: Medicare Other

## 2016-10-14 DIAGNOSIS — R2681 Unsteadiness on feet: Secondary | ICD-10-CM | POA: Diagnosis not present

## 2016-10-14 DIAGNOSIS — G8929 Other chronic pain: Secondary | ICD-10-CM | POA: Diagnosis not present

## 2016-10-14 DIAGNOSIS — M6281 Muscle weakness (generalized): Secondary | ICD-10-CM

## 2016-10-14 DIAGNOSIS — R2689 Other abnormalities of gait and mobility: Secondary | ICD-10-CM | POA: Diagnosis not present

## 2016-10-14 DIAGNOSIS — R279 Unspecified lack of coordination: Secondary | ICD-10-CM

## 2016-10-14 DIAGNOSIS — M5442 Lumbago with sciatica, left side: Principal | ICD-10-CM

## 2016-10-14 DIAGNOSIS — R262 Difficulty in walking, not elsewhere classified: Secondary | ICD-10-CM | POA: Diagnosis not present

## 2016-10-14 NOTE — Therapy (Signed)
[facility] [address]  [place], [place], [address] Phone: [phone]   Fax:  [phone]  Physical Therapy Treatment  Patient Details  Name: [name] MRN: [id number] Date of Birth: 20-Feb-1927 Referring Provider: Kathlene November, MD  Encounter Date: 10/14/2016      PT End of Session - 10/14/16 1518    Visit Number 3   Number of Visits 16   Date for PT Re-Evaluation 12/03/16   Authorization Type Medicare/BCBS   PT Start Time 1401   PT Stop Time 1445   PT Time Calculation (min) 44 min   Activity Tolerance Patient tolerated treatment well   Behavior During Therapy [facility] for tasks assessed/performed      Past Medical History:  Diagnosis Date  . Abdominal aortic aneurysm ([place]) 09/2008   s/o endovascular repair and angiogram 10/04/08   . Anemia    EGD Cscope 08-2012  . Bleeding ulcer 1997  . CKD (chronic kidney disease), stage IV (HCC)    Stable, Dr. Dimas Aguas  . COPD (chronic obstructive pulmonary disease) ([place])   . H/O hyperkalemia    Stable  . Hypertension   . Leukopenia 2/11   thought to be from doxy  . Renal cell carcinoma    clear cell type dx 10/09, s/p excision 10-24-08  . Renal insufficiency   . Solitary left kidney    W/ chronic tubule-interstitial damage, Dr. Dimas Aguas  . Spinal stenosis    causing LE weakness-persistant, being worked up by Hydrographic surveyor and neurologist    Past Surgical History:  Procedure Laterality Date  . ABDOMINAL AORTIC ANEURYSM REPAIR  2009   EVAR  . [place]  . [place] ([place]), 2004  . NEPHRECTOMY  2009   Open nephrectomy for cancer    There were no vitals filed for this visit.      Subjective Assessment - 10/14/16 1521    Subjective Pt. reporting he has back pain today and his legs feel weak.     Patient Stated Goals "less pain in back"   Currently in Pain? No/denies   Pain Score 5    Pain Location Back   Multiple Pain  Sites No      Today's treatment:  Therex: Treadmill: lvl 1.0, 4 min; cues provided for upright posture and increased step length; close supervision from therapist   Neuro re-education (close CGA from therapist for all neuro re-education activities): Corner balance standing on airex pad       Feet together:            Head turns R <> L x 10 reps each side            Rhomberg position with head turns x 10 reps each side       Staggered stance:           Head turns R <> L x 10 reps each side            Rhomberg position with head turns x 10 reps each side   Therex/HEP review: Hooklying bridge x 10 reps  Seated B hip ER clam shell with green TB x 10 reps each side Seated alternating march with green TB x 10 reps  Seated LAQ x 10 reps  Standing holding onto treadmill:        Alternating hip flexion x 10 reps each side         Alternating  hip abduction x 10 reps each side         PT Education - 10/14/16 1514    Education provided Yes   Education Details Review of prior hip strengthenig HEP (yellow highlighter pen used to denote activities already deemed as appropriate for HEP on 10/14/16): seated high knee march with green TB, seated clam shell with green TB, bridge, seated adduction squeeze, seated LAQ, Standing retraction with band, standing hip abduction, standing high knee march   Person(s) Educated Patient   Methods Explanation;Demonstration;Handout;Tactile cues;Verbal cues   Comprehension Verbalized understanding;Returned demonstration;Verbal cues required;Tactile cues required;Need further instruction          PT Short Term Goals - 10/12/16 1302      PT SHORT TERM GOAL #1   Title Pt will be independent with lumbar HEP by 10/29/16   Status On-going           PT Long Term Goals - 10/12/16 1302      PT LONG TERM GOAL #1   Title Pt will be independent with advanced lumbar +/- balance HEP by 12/03/16   Status On-going     PT LONG TERM GOAL #2   Title B LE  strength to >/= 4=/5 for improved function by 12/03/16   Status On-going     PT LONG TERM GOAL #3   Title Pt will demonstrate improvement on the Berg Balance Scale to 49/56 or greater to reduce risk for falls by 12/03/16   Status On-going     PT LONG TERM GOAL #4   Title Pt will improve gait speed to 2.5 ft/sec or greater for improved safety with community ambulation by 12/03/16   Status On-going               Plan - 10/14/16 1513    Clinical Impression Statement  Today's treatment focused on HEP review of prior HEP handouts to determine which are most appropriate to continue (see pt. education).  Corner balance activities reviewed with pt. and demonstrated today.  Pt. unable at this time to safely perform any SLS corner activities however performed well with feet together and staggered stance corner balance activities.  No corner balance activities highlighted in pt. handouts.  Will plan to add balance HEP activities at a later time.  Will plan to continue review of prior HEP handouts to determine which are appropriate for pt. next treatment.       PT Treatment/Interventions Patient/family education;Neuromuscular re-education;Therapeutic exercise;Manual techniques;Therapeutic activities;Functional mobility training;Balance training;Gait training;Moist Heat;Cryotherapy;Electrical Stimulation;ADLs/Self Care Home Management   PT Next Visit Plan Continue to review prior HEP and modify/update as indicated (therapist used yellow highlighter pen on 10/14/16 for activities already reviewed, also see pt. education); Lumbar stabilization/strengthening; Balance/coordination training; Manual therapy &/or modalities PRN for pain      Patient will benefit from skilled therapeutic intervention in order to improve the following deficits and impairments:  Pain, Postural dysfunction, Decreased strength, Decreased activity tolerance, Difficulty walking, Decreased balance, Abnormal gait, Decreased coordination,  Decreased endurance  Visit Diagnosis: Chronic left-sided low back pain with left-sided sciatica  Unsteadiness on feet  Unspecified lack of coordination  Difficulty in walking, not elsewhere classified  Other abnormalities of gait and mobility  Muscle weakness (generalized)     Problem List Patient Active Problem List   Diagnosis Date Noted  . Follow-up --- PCP NOTES 09/02/2015  . Neuropathy ([place]) 06/10/2015  . Pedal edema 01/30/2015  . Hip pain, chronic 01/30/2015  . Back pain 01/30/2015  . Cramps  of lower extremity 01/30/2015  . Aftercare following surgery of the circulatory system, [place] 08/05/2014  . Annual physical exam 10/19/2011  . DJD (degenerative joint disease) 12/14/2010  . Anemia 06/06/2009  . COPD (chronic obstructive pulmonary disease) ([place]) 06/06/2009  . RENAL CELL CANCER 12/25/2008  . HTN (hypertension) 12/25/2008  . AAA (abdominal aortic aneurysm) without rupture ([place]) 12/25/2008  . CKD (chronic kidney disease) 12/25/2008  . HYPERCHOLESTEROLEMIA 03/01/2008  . GERD 03/01/2008  . ACNE ROSACEA 11/29/2007  . DIVERTICULOSIS, COLON 04/12/2007    Bess Harvest, PTA 10/14/16 3:30 PM   [facility] [address]  [place], [place], [address] Phone: [phone]   Fax:  [phone]  Name: [name] MRN: [id number] Date of Birth: September 05, 1927

## 2016-10-18 ENCOUNTER — Ambulatory Visit: Payer: Medicare Other | Admitting: Physical Therapy

## 2016-10-18 DIAGNOSIS — M6281 Muscle weakness (generalized): Secondary | ICD-10-CM

## 2016-10-18 DIAGNOSIS — R279 Unspecified lack of coordination: Secondary | ICD-10-CM | POA: Diagnosis not present

## 2016-10-18 DIAGNOSIS — G8929 Other chronic pain: Secondary | ICD-10-CM | POA: Diagnosis not present

## 2016-10-18 DIAGNOSIS — M5442 Lumbago with sciatica, left side: Principal | ICD-10-CM

## 2016-10-18 DIAGNOSIS — R262 Difficulty in walking, not elsewhere classified: Secondary | ICD-10-CM | POA: Diagnosis not present

## 2016-10-18 DIAGNOSIS — R2689 Other abnormalities of gait and mobility: Secondary | ICD-10-CM

## 2016-10-18 DIAGNOSIS — R2681 Unsteadiness on feet: Secondary | ICD-10-CM | POA: Diagnosis not present

## 2016-10-18 NOTE — Therapy (Signed)
[facility] [address]  [place], [place], [address] Phone: [phone]   Fax:  [phone]  Physical Therapy Treatment  Patient Details  Name: [name] MRN: [id number] Date of Birth: 1927-02-20 Referring Provider: Kathlene November, MD  Encounter Date: 10/18/2016      PT End of Session - 10/18/16 0846    Visit Number 4   Number of Visits 16   Date for PT Re-Evaluation 12/03/16   Authorization Type Medicare/BCBS   PT Start Time 0846   PT Stop Time 0931   PT Time Calculation (min) 45 min   Activity Tolerance Patient tolerated treatment well   Behavior During Therapy [facility] for tasks assessed/performed      Past Medical History:  Diagnosis Date  . Abdominal aortic aneurysm ([place]) 09/2008   s/o endovascular repair and angiogram 10/04/08   . Anemia    EGD Cscope 08-2012  . Bleeding ulcer 1997  . CKD (chronic kidney disease), stage IV (HCC)    Stable, Dr. Dimas Aguas  . COPD (chronic obstructive pulmonary disease) ([place])   . H/O hyperkalemia    Stable  . Hypertension   . Leukopenia 2/11   thought to be from doxy  . Renal cell carcinoma    clear cell type dx 10/09, s/p excision 10-24-08  . Renal insufficiency   . Solitary left kidney    W/ chronic tubule-interstitial damage, Dr. Dimas Aguas  . Spinal stenosis    causing LE weakness-persistant, being worked up by Hydrographic surveyor and neurologist    Past Surgical History:  Procedure Laterality Date  . ABDOMINAL AORTIC ANEURYSM REPAIR  2009   EVAR  . [place]  . [place] (lip), 2004  . NEPHRECTOMY  2009   Open nephrectomy for cancer    There were no vitals filed for this visit.      Subjective Assessment - 10/18/16 0849    Subjective Pt reports pain across the lower back yesterday which he thinks was due to time spent [place]. States pain resolved today.   Patient Stated Goals "less pain in back"    Currently in Pain? No/denies           Today's Treatment  TherEx Treadmill - 1.0 mph x 6' Manual stretches for B HS, SKTC, figure 4 & KTOS piriformis x30" TrA + Hooklying Alt Hip ABD/ER with blue TB 15x3" TrA + Hooklying March 15x3" Bridge + B Hip ABD isometric with blue TB 15x3" Seated Row with blue TB (emphasizing upright posture and scapular retraction) x15 Standing against 1/2 FR on wall:    Trunk extension + Scapular retraction 10x10"    B Scapular retraction + Shoulder Horiz ABD with red TB x10 Trunk extension into doorframe + Scapular retraction wall for postural awareness 10x5" Standing at counter:    Alt Hip ABD with yellow TB x20 (x10 each side)    Alt Hip Ext with yellow TB x20    Alt Hip Flexion with yellow TB x20    Heel raises x10           PT Short Term Goals - 10/12/16 1302      PT SHORT TERM GOAL #1   Title Pt will be independent with lumbar HEP by 10/29/16   Status On-going           PT Long Term Goals - 10/12/16 1302  PT LONG TERM GOAL #1   Title Pt will be independent with advanced lumbar +/- balance HEP by 12/03/16   Status On-going     PT LONG TERM GOAL #2   Title B LE strength to >/= 4=/5 for improved function by 12/03/16   Status On-going     PT LONG TERM GOAL #3   Title Pt will demonstrate improvement on the Berg Balance Scale to 49/56 or greater to reduce risk for falls by 12/03/16   Status On-going     PT LONG TERM GOAL #4   Title Pt will improve gait speed to 2.5 ft/sec or greater for improved safety with community ambulation by 12/03/16   Status On-going               Plan - 10/18/16 0924    Clinical Impression Statement Treatment focusing on core and proximal LE strengthening with emphasis on upright posture. Pt tolerating addition of theraband resistance with some fatigue noted but no increased pain.   PT Treatment/Interventions Patient/family education;Neuromuscular re-education;Therapeutic exercise;Manual  techniques;Therapeutic activities;Functional mobility training;Balance training;Gait training;Moist Heat;Cryotherapy;Electrical Stimulation;ADLs/Self Care Home Management   PT Next Visit Plan Lumbar stabilization/strengthening; Balance/coordination training; Manual therapy &/or modalities PRN for pain   Consulted and Agree with Plan of Care Patient      Patient will benefit from skilled therapeutic intervention in order to improve the following deficits and impairments:  Pain, Postural dysfunction, Decreased strength, Decreased activity tolerance, Difficulty walking, Decreased balance, Abnormal gait, Decreased coordination, Decreased endurance  Visit Diagnosis: Chronic left-sided low back pain with left-sided sciatica  Unsteadiness on feet  Unspecified lack of coordination  Difficulty in walking, not elsewhere classified  Other abnormalities of gait and mobility  Muscle weakness (generalized)     Problem List Patient Active Problem List   Diagnosis Date Noted  . Follow-up --- PCP NOTES 09/02/2015  . Neuropathy ([place]) 06/10/2015  . Pedal edema 01/30/2015  . Hip pain, chronic 01/30/2015  . Back pain 01/30/2015  . Cramps of lower extremity 01/30/2015  . Aftercare following surgery of the circulatory system, [place] 08/05/2014  . Annual physical exam 10/19/2011  . DJD (degenerative joint disease) 12/14/2010  . Anemia 06/06/2009  . COPD (chronic obstructive pulmonary disease) ([place]) 06/06/2009  . RENAL CELL CANCER 12/25/2008  . HTN (hypertension) 12/25/2008  . AAA (abdominal aortic aneurysm) without rupture ([place]) 12/25/2008  . CKD (chronic kidney disease) 12/25/2008  . HYPERCHOLESTEROLEMIA 03/01/2008  . GERD 03/01/2008  . ACNE ROSACEA 11/29/2007  . DIVERTICULOSIS, COLON 04/12/2007    Percival Spanish, PT, MPT 10/18/2016, 9:35 AM  [facility] [place], [place], [address] Phone: [phone]   Fax:   [phone]  Name: [name] MRN: [id number] Date of Birth: Nov 30, 1927

## 2016-10-20 ENCOUNTER — Encounter: Payer: Self-pay | Admitting: Internal Medicine

## 2016-10-21 ENCOUNTER — Ambulatory Visit: Payer: Medicare Other | Admitting: Physical Therapy

## 2016-10-21 DIAGNOSIS — R2689 Other abnormalities of gait and mobility: Secondary | ICD-10-CM

## 2016-10-21 DIAGNOSIS — M5442 Lumbago with sciatica, left side: Principal | ICD-10-CM

## 2016-10-21 DIAGNOSIS — R2681 Unsteadiness on feet: Secondary | ICD-10-CM | POA: Diagnosis not present

## 2016-10-21 DIAGNOSIS — G8929 Other chronic pain: Secondary | ICD-10-CM

## 2016-10-21 DIAGNOSIS — R262 Difficulty in walking, not elsewhere classified: Secondary | ICD-10-CM | POA: Diagnosis not present

## 2016-10-21 DIAGNOSIS — R279 Unspecified lack of coordination: Secondary | ICD-10-CM

## 2016-10-21 DIAGNOSIS — M6281 Muscle weakness (generalized): Secondary | ICD-10-CM

## 2016-10-21 NOTE — Therapy (Signed)
[facility] [address]  [place], [place], [address] Phone: [phone]   Fax:  [phone]  Physical Therapy Treatment  Patient Details  Name: [name] MRN: [id number] Date of Birth: 1927/10/30 Referring Provider: Kathlene November, MD  Encounter Date: 10/21/2016      PT End of Session - 10/21/16 0845    Visit Number 5   Number of Visits 16   Date for PT Re-Evaluation 12/03/16   Authorization Type Medicare/BCBS   PT Start Time 0845   PT Stop Time 0927   PT Time Calculation (min) 42 min   Activity Tolerance Patient tolerated treatment well   Behavior During Therapy [facility] for tasks assessed/performed      Past Medical History:  Diagnosis Date  . Abdominal aortic aneurysm ([place]) 09/2008   s/o endovascular repair and angiogram 10/04/08   . Anemia    EGD Cscope 08-2012  . Bleeding ulcer 1997  . CKD (chronic kidney disease), stage IV (HCC)    Stable, Dr. Dimas Aguas  . COPD (chronic obstructive pulmonary disease) ([place])   . H/O hyperkalemia    Stable  . Hypertension   . Leukopenia 2/11   thought to be from doxy  . Renal cell carcinoma    clear cell type dx 10/09, s/p excision 10-24-08  . Renal insufficiency   . Solitary left kidney    W/ chronic tubule-interstitial damage, Dr. Dimas Aguas  . Spinal stenosis    causing LE weakness-persistant, being worked up by Hydrographic surveyor and neurologist    Past Surgical History:  Procedure Laterality Date  . ABDOMINAL AORTIC ANEURYSM REPAIR  2009   EVAR  . [place]  . [place] ([place]), 2004  . NEPHRECTOMY  2009   Open nephrectomy for cancer    There were no vitals filed for this visit.      Subjective Assessment - 10/21/16 0850    Subjective Pt denies pain today, just noting "slow and tired".   Patient Stated Goals "less pain in back"   Currently in Pain? No/denies           Today's Treatment  TherEx Treadmill - 1.2 mph x  6' Manual stretches for B HS, SKTC, figure 4 & KTOS piriformis x30" Bridge + B Hip ABD isometric with blue TB 15x3" TrA + Hooklying Alt Hip ABD/ER with blue TB 15x3" TrA + Hooklying March 15x3" B Sidelying clam with blue TB x10  Neuro Seated on green (65 cm) Pball (emphasizing upright posture):    Trunk rotation with arms held out in front x10    Alt UE lift x10    Alt LE march x10    Row with blue TB x10    Scapular retraction + Shoulder extension to neutral with blue TB x10 Standing at back of chair:    PWR! Up x10    PWR! Rock x10            PT Short Term Goals - 10/12/16 1302      PT SHORT TERM GOAL #1   Title Pt will be independent with lumbar HEP by 10/29/16   Status On-going           PT Long Term Goals - 10/12/16 1302      PT LONG TERM GOAL #1   Title Pt will be independent with advanced lumbar +/- balance HEP by 12/03/16   Status On-going     PT  LONG TERM GOAL #2   Title B LE strength to >/= 4=/5 for improved function by 12/03/16   Status On-going     PT LONG TERM GOAL #3   Title Pt will demonstrate improvement on the Berg Balance Scale to 49/56 or greater to reduce risk for falls by 12/03/16   Status On-going     PT LONG TERM GOAL #4   Title Pt will improve gait speed to 2.5 ft/sec or greater for improved safety with community ambulation by 12/03/16   Status On-going               Plan - 10/21/16 0851    Clinical Impression Statement Pt tolerating strengthening program well with no pain other than mild discomfort with stretching. Initiate balance training in stting on physioball with frequent cues needed for posture. Introduced [employer]! moves to facilitate posture and weight shift, but pt demonstrating limited coordination.   PT Treatment/Interventions Patient/family education;Neuromuscular re-education;Therapeutic exercise;Manual techniques;Therapeutic activities;Functional mobility training;Balance training;Gait training;Moist  Heat;Cryotherapy;Electrical Stimulation;ADLs/Self Care Home Management   PT Next Visit Plan Lumbar stabilization/strengthening; Balance/coordination training including PWR! Moves as appropriate; Manual therapy &/or modalities PRN for pain   Consulted and Agree with Plan of Care Patient      Patient will benefit from skilled therapeutic intervention in order to improve the following deficits and impairments:  Pain, Postural dysfunction, Decreased strength, Decreased activity tolerance, Difficulty walking, Decreased balance, Abnormal gait, Decreased coordination, Decreased endurance  Visit Diagnosis: Chronic left-sided low back pain with left-sided sciatica  Unsteadiness on feet  Unspecified lack of coordination  Difficulty in walking, not elsewhere classified  Other abnormalities of gait and mobility  Muscle weakness (generalized)     Problem List Patient Active Problem List   Diagnosis Date Noted  . Follow-up --- PCP NOTES 09/02/2015  . Neuropathy ([place]) 06/10/2015  . Pedal edema 01/30/2015  . Hip pain, chronic 01/30/2015  . Back pain 01/30/2015  . Cramps of lower extremity 01/30/2015  . Aftercare following surgery of the circulatory system, [place] 08/05/2014  . Annual physical exam 10/19/2011  . DJD (degenerative joint disease) 12/14/2010  . Anemia 06/06/2009  . COPD (chronic obstructive pulmonary disease) ([place]) 06/06/2009  . RENAL CELL CANCER 12/25/2008  . HTN (hypertension) 12/25/2008  . AAA (abdominal aortic aneurysm) without rupture ([place]) 12/25/2008  . CKD (chronic kidney disease) 12/25/2008  . HYPERCHOLESTEROLEMIA 03/01/2008  . GERD 03/01/2008  . ACNE ROSACEA 11/29/2007  . DIVERTICULOSIS, COLON 04/12/2007    Percival Spanish, PT, MPT 10/21/2016, 9:32 AM  [facility] [place], [place], [address] Phone: [phone]   Fax:  [phone]  Name: [name] MRN: [id number] Date of  Birth: 1927-03-11

## 2016-10-22 DIAGNOSIS — N183 Chronic kidney disease, stage 3 (moderate): Secondary | ICD-10-CM | POA: Diagnosis not present

## 2016-10-22 DIAGNOSIS — I10 Essential (primary) hypertension: Secondary | ICD-10-CM | POA: Diagnosis not present

## 2016-10-22 LAB — CBC AND DIFFERENTIAL
HEMATOCRIT: 34 % — AB (ref 41–53)
Hemoglobin: 11 g/dL — AB (ref 13.5–17.5)
NEUTROS ABS: 1 /uL
PLATELETS: 97 10*3/uL — AB (ref 150–399)
WBC: 2.7 10^3/mL

## 2016-10-22 LAB — BASIC METABOLIC PANEL
BUN: 27 mg/dL — AB (ref 4–21)
Creatinine: 2.2 mg/dL — AB (ref 0.6–1.3)
GLUCOSE: 95 mg/dL
Potassium: 4.5 mmol/L (ref 3.4–5.3)
SODIUM: 144 mmol/L (ref 137–147)

## 2016-10-25 ENCOUNTER — Ambulatory Visit: Payer: Medicare Other | Admitting: Physical Therapy

## 2016-10-25 DIAGNOSIS — N184 Chronic kidney disease, stage 4 (severe): Secondary | ICD-10-CM | POA: Diagnosis not present

## 2016-10-25 DIAGNOSIS — G8929 Other chronic pain: Secondary | ICD-10-CM | POA: Diagnosis not present

## 2016-10-25 DIAGNOSIS — R2681 Unsteadiness on feet: Secondary | ICD-10-CM

## 2016-10-25 DIAGNOSIS — M5442 Lumbago with sciatica, left side: Secondary | ICD-10-CM | POA: Diagnosis not present

## 2016-10-25 DIAGNOSIS — M6281 Muscle weakness (generalized): Secondary | ICD-10-CM

## 2016-10-25 DIAGNOSIS — R279 Unspecified lack of coordination: Secondary | ICD-10-CM

## 2016-10-25 DIAGNOSIS — N390 Urinary tract infection, site not specified: Secondary | ICD-10-CM | POA: Diagnosis not present

## 2016-10-25 DIAGNOSIS — R809 Proteinuria, unspecified: Secondary | ICD-10-CM | POA: Diagnosis not present

## 2016-10-25 DIAGNOSIS — R2689 Other abnormalities of gait and mobility: Secondary | ICD-10-CM

## 2016-10-25 DIAGNOSIS — R262 Difficulty in walking, not elsewhere classified: Secondary | ICD-10-CM

## 2016-10-25 DIAGNOSIS — I10 Essential (primary) hypertension: Secondary | ICD-10-CM | POA: Diagnosis not present

## 2016-10-25 NOTE — Therapy (Signed)
[facility] [address]  [place], [place], [address] Phone: [phone]   Fax:  [phone]  Physical Therapy Treatment  Patient Details  Name: [name] MRN: [id number] Date of Birth: [birth year] Referring Provider: Kathlene November, MD  Encounter Date: 10/25/2016      PT End of Session - 10/25/16 1315    Visit Number 6   Number of Visits 16   Date for PT Re-Evaluation 12/03/16   Authorization Type Medicare/BCBS   PT Start Time 1315   PT Stop Time 1359   PT Time Calculation (min) 44 min   Activity Tolerance Patient tolerated treatment well   Behavior During Therapy [facility] for tasks assessed/performed      Past Medical History:  Diagnosis Date  . Abdominal aortic aneurysm ([place]) 09/2008   s/o endovascular repair and angiogram 10/04/08   . Anemia    EGD Cscope 08-2012  . Bleeding ulcer 1997  . CKD (chronic kidney disease), stage IV (HCC)    Stable, Dr. Dimas Aguas  . COPD (chronic obstructive pulmonary disease) ([place])   . H/O hyperkalemia    Stable  . Hypertension   . Leukopenia 2/11   thought to be from doxy  . Renal cell carcinoma    clear cell type dx 10/09, s/p excision 10-24-08  . Renal insufficiency   . Solitary left kidney    W/ chronic tubule-interstitial damage, Dr. Dimas Aguas  . Spinal stenosis    causing LE weakness-persistant, being worked up by Hydrographic surveyor and neurologist    Past Surgical History:  Procedure Laterality Date  . ABDOMINAL AORTIC ANEURYSM REPAIR  2009   EVAR  . [place]  . [place] ([place]), 2004  . NEPHRECTOMY  2009   Open nephrectomy for cancer    There were no vitals filed for this visit.      Subjective Assessment - 10/25/16 1319    Subjective Pt reporting pain across the low back and hips today.   Patient Stated Goals "less pain in back"   Currently in Pain? Yes   Pain Score 5    Pain Location Back   Pain Orientation  Lower;Left;Right           Today's Treatment  TherEx Treadmill - 1.2 mph x 6'  Manual Manual stretches for B HS, SKTC, figure 4 & KTOS piriformis x30"  Neuro PWR! Moves per flowsheet          PWR [facility]) - 10/25/16 1315    PWR! exercises Moves in supine;Moves in sitting;Moves in standing   PWR! Up x10  sitting   PWR! Rock x10   PWR! Twist x10   PWR! Step x10   PWR! Up x10  standing   PWR! Rock x10   PWR! Twist x10   PWR! Up x10  sitting   PWR! Rock x10   PWR! Twist x10   PWR! Step x10               PT Short Term Goals - 10/25/16 1321      PT SHORT TERM GOAL #1   Title Pt will be independent with lumbar HEP by 10/29/16   Status Achieved           PT Long Term Goals - 10/12/16 1302      PT LONG TERM GOAL #1   Title Pt will be independent with advanced lumbar +/- balance HEP by 12/03/16  Status On-going     PT LONG TERM GOAL #2   Title B LE strength to >/= 4=/5 for improved function by 12/03/16   Status On-going     PT LONG TERM GOAL #3   Title Pt will demonstrate improvement on the Berg Balance Scale to 49/56 or greater to reduce risk for falls by 12/03/16   Status On-going     PT LONG TERM GOAL #4   Title Pt will improve gait speed to 2.5 ft/sec or greater for improved safety with community ambulation by 12/03/16   Status On-going               Plan - 10/25/16 1321    Clinical Impression Statement Pt reporting increased LBP today but resolved with stretching. Continued trial of PWR! Moves to facilitate posture and weight shifting with pt continuing to demonstrate limited coordination with many moves and fatiguing with standing moves. Will continue attempts as tolerated and incorporate other balance training including possible [place] as indicated.   Rehab Potential Good   Clinical Impairments Affecting Rehab Potential Advanced age, HTN, COPD, CKD, LE neuropathy   PT Treatment/Interventions Patient/family education;Neuromuscular  re-education;Therapeutic exercise;Manual techniques;Therapeutic activities;Functional mobility training;Balance training;Gait training;Moist Heat;Cryotherapy;Electrical Stimulation;ADLs/Self Care Home Management   PT Next Visit Plan Lumbar stabilization/strengthening; Balance/coordination training including PWR! Moves or [place] as appropriate; Manual therapy &/or modalities PRN for pain   Consulted and Agree with Plan of Care Patient      Patient will benefit from skilled therapeutic intervention in order to improve the following deficits and impairments:  Pain, Postural dysfunction, Decreased strength, Decreased activity tolerance, Difficulty walking, Decreased balance, Abnormal gait, Decreased coordination, Decreased endurance  Visit Diagnosis: Chronic left-sided low back pain with left-sided sciatica  Unsteadiness on feet  Unspecified lack of coordination  Difficulty in walking, not elsewhere classified  Other abnormalities of gait and mobility  Muscle weakness (generalized)     Problem List Patient Active Problem List   Diagnosis Date Noted  . Follow-up --- PCP NOTES 09/02/2015  . Neuropathy ([place]) 06/10/2015  . Pedal edema 01/30/2015  . Hip pain, chronic 01/30/2015  . Back pain 01/30/2015  . Cramps of lower extremity 01/30/2015  . Aftercare following surgery of the circulatory system, [place] 08/05/2014  . Annual physical exam 10/19/2011  . DJD (degenerative joint disease) 12/14/2010  . Anemia 06/06/2009  . COPD (chronic obstructive pulmonary disease) ([place]) 06/06/2009  . RENAL CELL CANCER 12/25/2008  . HTN (hypertension) 12/25/2008  . AAA (abdominal aortic aneurysm) without rupture ([place]) 12/25/2008  . CKD (chronic kidney disease) 12/25/2008  . HYPERCHOLESTEROLEMIA 03/01/2008  . GERD 03/01/2008  . ACNE ROSACEA 11/29/2007  . DIVERTICULOSIS, COLON 04/12/2007    Percival Spanish, PT, MPT 10/25/2016, 4:41 PM  [facility] [address]  [place], [place], [address] Phone: [phone]   Fax:  [phone]  Name: [name] MRN: [id number] Date of Birth: 10-24-1927

## 2016-10-27 ENCOUNTER — Ambulatory Visit: Payer: Medicare Other | Attending: Internal Medicine

## 2016-10-27 DIAGNOSIS — R279 Unspecified lack of coordination: Secondary | ICD-10-CM

## 2016-10-27 DIAGNOSIS — M5442 Lumbago with sciatica, left side: Secondary | ICD-10-CM | POA: Insufficient documentation

## 2016-10-27 DIAGNOSIS — R2681 Unsteadiness on feet: Secondary | ICD-10-CM

## 2016-10-27 DIAGNOSIS — R2689 Other abnormalities of gait and mobility: Secondary | ICD-10-CM

## 2016-10-27 DIAGNOSIS — M6281 Muscle weakness (generalized): Secondary | ICD-10-CM

## 2016-10-27 DIAGNOSIS — R262 Difficulty in walking, not elsewhere classified: Secondary | ICD-10-CM

## 2016-10-27 DIAGNOSIS — G8929 Other chronic pain: Secondary | ICD-10-CM

## 2016-10-27 NOTE — Therapy (Signed)
[facility] [address]  [place], [place], [address] Phone: [phone]   Fax:  [phone]  Physical Therapy Treatment  Patient Details  Name: [name] MRN: [id number] Date of Birth: 05/29/1927 Referring Provider: Kathlene November, MD  Encounter Date: 10/27/2016      PT End of Session - 10/27/16 1328    Visit Number 7   Number of Visits 16   Date for PT Re-Evaluation 12/03/16   Authorization Type Medicare/BCBS   PT Start Time 1318   PT Stop Time 1400   PT Time Calculation (min) 42 min   Activity Tolerance Patient tolerated treatment well   Behavior During Therapy [facility] for tasks assessed/performed      Past Medical History:  Diagnosis Date  . Abdominal aortic aneurysm ([place]) 09/2008   s/o endovascular repair and angiogram 10/04/08   . Anemia    EGD Cscope 08-2012  . Bleeding ulcer 1997  . CKD (chronic kidney disease), stage IV (HCC)    Stable, Dr. Dimas Aguas  . COPD (chronic obstructive pulmonary disease) ([place])   . H/O hyperkalemia    Stable  . Hypertension   . Leukopenia 2/11   thought to be from doxy  . Renal cell carcinoma    clear cell type dx 10/09, s/p excision 10-24-08  . Renal insufficiency   . Solitary left kidney    W/ chronic tubule-interstitial damage, Dr. Dimas Aguas  . Spinal stenosis    causing LE weakness-persistant, being worked up by Hydrographic surveyor and neurologist    Past Surgical History:  Procedure Laterality Date  . ABDOMINAL AORTIC ANEURYSM REPAIR  2009   EVAR  . [place]  . [place] ([place]), 2004  . NEPHRECTOMY  2009   Open nephrectomy for cancer    There were no vitals filed for this visit.      Subjective Assessment - 10/27/16 1401    Subjective Pt. reporting LBP is better today.     Patient Stated Goals "less pain in back"   Currently in Pain? Yes   Pain Score 3    Pain Location Back   Pain Orientation Lower;Left;Right   Pain  Descriptors / Indicators Shooting   Pain Type Acute pain;Chronic pain     Today's treatment:  Therex: Treadmill: 4 min, 1.0 lvl with alternating red p-ball kick for increased step length Sit<>stand with push off from knees x 10 reps; cues for wide arm scapular squeeze at top of stand Alternating step up onto airex pad with cross-over reach to red cone x 7 reps each leg  Standing at treadmill:         Standing scapular retraction with blue TB x 15 reps; tactile cues for scapular retraction        B staggered standing scapular retraction with black TB x 15 reps each way  Hooklying bridge x 10 reps Hooklying LE march x 10 reps each  Sit<>stand with push off from knees x 10 reps; cues for wide arm scapular squeeze at top of stand         PT Short Term Goals - 10/25/16 1321      PT SHORT TERM GOAL #1   Title Pt will be independent with lumbar HEP by 10/29/16   Status Achieved           PT Long Term Goals - 10/12/16 1302      PT LONG TERM GOAL #1  Title Pt will be independent with advanced lumbar +/- balance HEP by 12/03/16   Status On-going     PT LONG TERM GOAL #2   Title B LE strength to >/= 4=/5 for improved function by 12/03/16   Status On-going     PT LONG TERM GOAL #3   Title Pt will demonstrate improvement on the Berg Balance Scale to 49/56 or greater to reduce risk for falls by 12/03/16   Status On-going     PT LONG TERM GOAL #4   Title Pt will improve gait speed to 2.5 ft/sec or greater for improved safety with community ambulation by 12/03/16   Status On-going               Plan - 10/27/16 1329    Clinical Impression Statement  Pt. tolerated today's treatment focused on scapular strengthening, lumbopelvic strengthening, and sit<>standing activities well without increased LBP.  Therex performed in standing to pt. tolerance today with lumbopelvic strengthening performed in supine with rest breaks.  Pt. reported being pain free following standing therex  today and seems to be progressing well.     PT Treatment/Interventions Patient/family education;Neuromuscular re-education;Therapeutic exercise;Manual techniques;Therapeutic activities;Functional mobility training;Balance training;Gait training;Moist Heat;Cryotherapy;Electrical Stimulation;ADLs/Self Care Home Management   PT Next Visit Plan Lumbar stabilization/strengthening; Balance/coordination training including PWR! Moves or [place] as appropriate; Manual therapy &/or modalities PRN for pain      Patient will benefit from skilled therapeutic intervention in order to improve the following deficits and impairments:  Pain, Postural dysfunction, Decreased strength, Decreased activity tolerance, Difficulty walking, Decreased balance, Abnormal gait, Decreased coordination, Decreased endurance  Visit Diagnosis: Chronic left-sided low back pain with left-sided sciatica  Unsteadiness on feet  Unspecified lack of coordination  Difficulty in walking, not elsewhere classified  Other abnormalities of gait and mobility  Muscle weakness (generalized)     Problem List Patient Active Problem List   Diagnosis Date Noted  . Follow-up --- PCP NOTES 09/02/2015  . Neuropathy ([place]) 06/10/2015  . Pedal edema 01/30/2015  . Hip pain, chronic 01/30/2015  . Back pain 01/30/2015  . Cramps of lower extremity 01/30/2015  . Aftercare following surgery of the circulatory system, [place] 08/05/2014  . Annual physical exam 10/19/2011  . DJD (degenerative joint disease) 12/14/2010  . Anemia 06/06/2009  . COPD (chronic obstructive pulmonary disease) ([place]) 06/06/2009  . RENAL CELL CANCER 12/25/2008  . HTN (hypertension) 12/25/2008  . AAA (abdominal aortic aneurysm) without rupture ([place]) 12/25/2008  . CKD (chronic kidney disease) 12/25/2008  . HYPERCHOLESTEROLEMIA 03/01/2008  . GERD 03/01/2008  . ACNE ROSACEA 11/29/2007  . DIVERTICULOSIS, COLON 04/12/2007    Bess Harvest, PTA 10/27/16 2:18 PM  [facility] [address]  [place], [place], [address] Phone: [phone]   Fax:  [phone]  Name: [name] MRN: [id number] Date of Birth: [birth year]

## 2016-11-01 ENCOUNTER — Ambulatory Visit: Payer: Medicare Other | Admitting: Physical Therapy

## 2016-11-01 ENCOUNTER — Encounter: Payer: Self-pay | Admitting: Internal Medicine

## 2016-11-01 DIAGNOSIS — R262 Difficulty in walking, not elsewhere classified: Secondary | ICD-10-CM

## 2016-11-01 DIAGNOSIS — R279 Unspecified lack of coordination: Secondary | ICD-10-CM | POA: Diagnosis not present

## 2016-11-01 DIAGNOSIS — R2689 Other abnormalities of gait and mobility: Secondary | ICD-10-CM

## 2016-11-01 DIAGNOSIS — R2681 Unsteadiness on feet: Secondary | ICD-10-CM

## 2016-11-01 DIAGNOSIS — M6281 Muscle weakness (generalized): Secondary | ICD-10-CM

## 2016-11-01 DIAGNOSIS — M5442 Lumbago with sciatica, left side: Principal | ICD-10-CM

## 2016-11-01 DIAGNOSIS — G8929 Other chronic pain: Secondary | ICD-10-CM | POA: Diagnosis not present

## 2016-11-01 NOTE — Therapy (Signed)
[facility] [address]  [place], [place], [address] Phone: [phone]   Fax:  [phone]  Physical Therapy Treatment  Patient Details  Name: [name] MRN: [id number] Date of Birth: 03-30-1927 Referring Provider: Kathlene November, MD  Encounter Date: 11/01/2016      PT End of Session - 11/01/16 1317    Visit Number 8   Number of Visits 16   Date for PT Re-Evaluation 12/03/16   Authorization Type Medicare/BCBS   PT Start Time [id number]   PT Stop Time 1356   PT Time Calculation (min) 39 min   Activity Tolerance Patient tolerated treatment well   Behavior During Therapy [facility] for tasks assessed/performed      Past Medical History:  Diagnosis Date  . Abdominal aortic aneurysm ([place]) 09/2008   s/o endovascular repair and angiogram 10/04/08   . Anemia    EGD Cscope 08-2012  . Bleeding ulcer 1997  . CKD (chronic kidney disease), stage IV (HCC)    Stable, Dr. Dimas Aguas  . COPD (chronic obstructive pulmonary disease) ([place])   . H/O hyperkalemia    Stable  . Hypertension   . Leukopenia 2/11   thought to be from doxy  . Renal cell carcinoma    clear cell type dx 10/09, s/p excision 10-24-08  . Renal insufficiency   . Solitary left kidney    W/ chronic tubule-interstitial damage, Dr. Dimas Aguas  . Spinal stenosis    causing LE weakness-persistant, being worked up by Hydrographic surveyor and neurologist    Past Surgical History:  Procedure Laterality Date  . ABDOMINAL AORTIC ANEURYSM REPAIR  2009   EVAR  . [place]  . [place] ([place]), 2004  . NEPHRECTOMY  2009   Open nephrectomy for cancer    There were no vitals filed for this visit.      Subjective Assessment - 11/01/16 1317    Subjective Pt denies pain, stating "just tired" today.   Patient Stated Goals "less pain in back"   Currently in Pain? No/denies          Today's Treatment  TherEx Treadmill - 1.27mph x  6' Standing at counter: Alt Hip ABD with yellow TB x20 (x10 each side) Alt Hip Ext with yellow TB x20 Alt Hip Flexion with yellow TB x20 Heel raises x10  Neuro PWR! Moves per flowsheet          PWR [facility]) - 11/01/16 1317    PWR! exercises Moves in sitting;Moves in standing;Functional moves   PWR! Up x10  standing   PWR! Rock x10   PWR! Twist x10   PWR Step x10   PWR! Sit to Stand x10   PWR! Up x10  sitting   PWR! Rock x10   PWR! Twist x10   PWR! Step x10               PT Short Term Goals - 10/25/16 1321      PT SHORT TERM GOAL #1   Title Pt will be independent with lumbar HEP by 10/29/16   Status Achieved           PT Long Term Goals - 10/12/16 1302      PT LONG TERM GOAL #1   Title Pt will be independent with advanced lumbar +/- balance HEP by 12/03/16   Status On-going     PT LONG TERM GOAL #2   Title B LE  strength to >/= 4=/5 for improved function by 12/03/16   Status On-going     PT LONG TERM GOAL #3   Title Pt will demonstrate improvement on the Berg Balance Scale to 49/56 or greater to reduce risk for falls by 12/03/16   Status On-going     PT LONG TERM GOAL #4   Title Pt will improve gait speed to 2.5 ft/sec or greater for improved safety with community ambulation by 12/03/16   Status On-going               Plan - 11/01/16 1322    Clinical Impression Statement Pt w/o c/o back pain today, therefore continued focus on balance and coordination with PWR! Moves along with standing lower body strengthening exercises. Pt continues to fatigue requiring rest breaks during/between most exercises.   PT Treatment/Interventions Patient/family education;Neuromuscular re-education;Therapeutic exercise;Manual techniques;Therapeutic activities;Functional mobility training;Balance training;Gait training;Moist Heat;Cryotherapy;Electrical Stimulation;ADLs/Self Care Home Management   PT Next Visit Plan Lumbar stabilization/strengthening;  Balance/coordination training including PWR! Moves or [place] as appropriate; Manual therapy &/or modalities PRN for pain   Consulted and Agree with Plan of Care Patient      Patient will benefit from skilled therapeutic intervention in order to improve the following deficits and impairments:  Pain, Postural dysfunction, Decreased strength, Decreased activity tolerance, Difficulty walking, Decreased balance, Abnormal gait, Decreased coordination, Decreased endurance  Visit Diagnosis: Chronic left-sided low back pain with left-sided sciatica  Unsteadiness on feet  Unspecified lack of coordination  Difficulty in walking, not elsewhere classified  Other abnormalities of gait and mobility  Muscle weakness (generalized)     Problem List Patient Active Problem List   Diagnosis Date Noted  . Follow-up --- PCP NOTES 09/02/2015  . Neuropathy ([place]) 06/10/2015  . Pedal edema 01/30/2015  . Hip pain, chronic 01/30/2015  . Back pain 01/30/2015  . Cramps of lower extremity 01/30/2015  . Aftercare following surgery of the circulatory system, [place] 08/05/2014  . Annual physical exam 10/19/2011  . DJD (degenerative joint disease) 12/14/2010  . Anemia 06/06/2009  . COPD (chronic obstructive pulmonary disease) ([place]) 06/06/2009  . RENAL CELL CANCER 12/25/2008  . HTN (hypertension) 12/25/2008  . AAA (abdominal aortic aneurysm) without rupture ([place]) 12/25/2008  . CKD (chronic kidney disease) 12/25/2008  . HYPERCHOLESTEROLEMIA 03/01/2008  . GERD 03/01/2008  . ACNE ROSACEA 11/29/2007  . DIVERTICULOSIS, COLON 04/12/2007    Percival Spanish, PT, MPT 11/01/2016, 1:58 PM  [facility] [address]  [place], [place], [address] Phone: [phone]   Fax:  [phone]  Name: [name] MRN: [id number] Date of Birth: Jan 20, 1927

## 2016-11-03 ENCOUNTER — Ambulatory Visit: Payer: Medicare Other

## 2016-11-03 DIAGNOSIS — R2681 Unsteadiness on feet: Secondary | ICD-10-CM | POA: Diagnosis not present

## 2016-11-03 DIAGNOSIS — R279 Unspecified lack of coordination: Secondary | ICD-10-CM | POA: Diagnosis not present

## 2016-11-03 DIAGNOSIS — R2689 Other abnormalities of gait and mobility: Secondary | ICD-10-CM | POA: Diagnosis not present

## 2016-11-03 DIAGNOSIS — M5442 Lumbago with sciatica, left side: Principal | ICD-10-CM

## 2016-11-03 DIAGNOSIS — R262 Difficulty in walking, not elsewhere classified: Secondary | ICD-10-CM

## 2016-11-03 DIAGNOSIS — M6281 Muscle weakness (generalized): Secondary | ICD-10-CM

## 2016-11-03 DIAGNOSIS — G8929 Other chronic pain: Secondary | ICD-10-CM

## 2016-11-03 NOTE — Therapy (Signed)
[facility] [address]  [place], [place], [address] Phone: [phone]   Fax:  [phone]  Physical Therapy Treatment  Patient Details  Name: [name] MRN: [id number] Date of Birth: Mar 12, 1927 Referring Provider: Kathlene November, MD  Encounter Date: 11/03/2016      PT End of Session - 11/03/16 1329    Visit Number 9   Number of Visits 16   Date for PT Re-Evaluation 12/03/16   Authorization Type Medicare/BCBS   PT Start Time [id number]   PT Stop Time 1357   PT Time Calculation (min) 40 min   Activity Tolerance Patient tolerated treatment well   Behavior During Therapy [facility] for tasks assessed/performed      Past Medical History:  Diagnosis Date  . Abdominal aortic aneurysm ([place]) 09/2008   s/o endovascular repair and angiogram 10/04/08   . Anemia    EGD Cscope 08-2012  . Bleeding ulcer 1997  . CKD (chronic kidney disease), stage IV (HCC)    Stable, Dr. Dimas Aguas  . COPD (chronic obstructive pulmonary disease) ([place])   . H/O hyperkalemia    Stable  . Hypertension   . Leukopenia 2/11   thought to be from doxy  . Renal cell carcinoma    clear cell type dx 10/09, s/p excision 10-24-08  . Renal insufficiency   . Solitary left kidney    W/ chronic tubule-interstitial damage, Dr. Dimas Aguas  . Spinal stenosis    causing LE weakness-persistant, being worked up by Hydrographic surveyor and neurologist    Past Surgical History:  Procedure Laterality Date  . ABDOMINAL AORTIC ANEURYSM REPAIR  2009   EVAR  . [place]  . [place] ([place]), 2004  . NEPHRECTOMY  2009   Open nephrectomy for cancer    There were no vitals filed for this visit.      Subjective Assessment - 11/03/16 1323    Subjective Pt. reporting tiredness today in B LE's.     Patient Stated Goals "less pain in back"   Currently in Pain? No/denies   Pain Score 0-No pain   Multiple Pain Sites No      Today's  treatment:  Therex (no LBP today thus balance and posture focus): Treadmill: lvl 1.1, 4 min; verbal cues for increased step length provided throughout Sit<>stand without UE pushoff x 10 reps; some fatigue on last rep Standing next to treadmill:       Scapular retraction with blue TB and staggered stance R foot forward x 10 reps        Alternating hip abduction x 10 reps each side; 2 UE support        Scapular retraction with blue TB and staggered stance L foot forward x 10 reps  Seated PWR! Twist x 10 reps each way on chair with no armrests Seated on green p-ball (65cm):        Alternating LE march with close CGA from therapist x 10 reps; cues provided for upright posture       Seated scapular retraction with blue TB and close CGA from therapist 2 x 10 reps; TB anchored by additional therapist; 2nd set with narrow foot stance Sit<>stand without UE pushoff x 10 reps; pushoff from knees on last 5 reps          PT Short Term Goals - 10/25/16 1321      PT SHORT TERM GOAL #1   Title Pt  will be independent with lumbar HEP by 10/29/16   Status Achieved           PT Long Term Goals - 10/12/16 1302      PT LONG TERM GOAL #1   Title Pt will be independent with advanced lumbar +/- balance HEP by 12/03/16   Status On-going     PT LONG TERM GOAL #2   Title B LE strength to >/= 4=/5 for improved function by 12/03/16   Status On-going     PT LONG TERM GOAL #3   Title Pt will demonstrate improvement on the Berg Balance Scale to 49/56 or greater to reduce risk for falls by 12/03/16   Status On-going     PT LONG TERM GOAL #4   Title Pt will improve gait speed to 2.5 ft/sec or greater for improved safety with community ambulation by 12/03/16   Status On-going               Plan - 11/03/16 1430    Clinical Impression Statement Pt. tolerated today's standing balance activities and scapular strengthening and posture promotion activities very well.  Only complaint today was B LE  fatigue which subsided with sitting rest breaks.  Pt. lower back pain free today.  Next visit will be 10th visit in [place].      PT Treatment/Interventions Patient/family education;Neuromuscular re-education;Therapeutic exercise;Manual techniques;Therapeutic activities;Functional mobility training;Balance training;Gait training;Moist Heat;Cryotherapy;Electrical Stimulation;ADLs/Self Care Home Management   PT Next Visit Plan FOTO; G-code; Lumbar stabilization/strengthening; Balance/coordination training including PWR! Moves or [place] as appropriate; Manual therapy &/or modalities PRN for pain      Patient will benefit from skilled therapeutic intervention in order to improve the following deficits and impairments:  Pain, Postural dysfunction, Decreased strength, Decreased activity tolerance, Difficulty walking, Decreased balance, Abnormal gait, Decreased coordination, Decreased endurance  Visit Diagnosis: Chronic left-sided low back pain with left-sided sciatica  Unsteadiness on feet  Unspecified lack of coordination  Difficulty in walking, not elsewhere classified  Other abnormalities of gait and mobility  Muscle weakness (generalized)     Problem List Patient Active Problem List   Diagnosis Date Noted  . Follow-up --- PCP NOTES 09/02/2015  . Neuropathy ([place]) 06/10/2015  . Pedal edema 01/30/2015  . Hip pain, chronic 01/30/2015  . Back pain 01/30/2015  . Cramps of lower extremity 01/30/2015  . Aftercare following surgery of the circulatory system, [place] 08/05/2014  . Annual physical exam 10/19/2011  . DJD (degenerative joint disease) 12/14/2010  . Anemia 06/06/2009  . COPD (chronic obstructive pulmonary disease) ([place]) 06/06/2009  . RENAL CELL CANCER 12/25/2008  . HTN (hypertension) 12/25/2008  . AAA (abdominal aortic aneurysm) without rupture ([place]) 12/25/2008  . CKD (chronic kidney disease) 12/25/2008  . HYPERCHOLESTEROLEMIA 03/01/2008  . GERD 03/01/2008  . ACNE ROSACEA  11/29/2007  . DIVERTICULOSIS, COLON 04/12/2007    Bess Harvest, PTA 11/03/16 2:39 PM  [facility] [address]  [place], [place], [address] Phone: [phone]   Fax:  [phone]  Name: GRECO PURSLEY MRN: [id number] Date of Birth: 1927/01/22

## 2016-11-09 ENCOUNTER — Ambulatory Visit: Payer: Medicare Other

## 2016-11-09 DIAGNOSIS — R262 Difficulty in walking, not elsewhere classified: Secondary | ICD-10-CM | POA: Diagnosis not present

## 2016-11-09 DIAGNOSIS — R2681 Unsteadiness on feet: Secondary | ICD-10-CM

## 2016-11-09 DIAGNOSIS — G8929 Other chronic pain: Secondary | ICD-10-CM

## 2016-11-09 DIAGNOSIS — R2689 Other abnormalities of gait and mobility: Secondary | ICD-10-CM

## 2016-11-09 DIAGNOSIS — M5442 Lumbago with sciatica, left side: Principal | ICD-10-CM

## 2016-11-09 DIAGNOSIS — R279 Unspecified lack of coordination: Secondary | ICD-10-CM | POA: Diagnosis not present

## 2016-11-09 DIAGNOSIS — M6281 Muscle weakness (generalized): Secondary | ICD-10-CM

## 2016-11-09 NOTE — Therapy (Signed)
[facility] [address]  [place], [place], [address] Phone: [phone]   Fax:  [phone]  Physical Therapy Treatment  Patient Details  Name: [name] MRN: [id number] Date of Birth: February 05, 1927 Referring Provider: Kathlene November, MD  Encounter Date: 11/09/2016      PT End of Session - 11/09/16 1407    Visit Number 10   Number of Visits 16   Date for PT Re-Evaluation 12/03/16   Authorization Type Medicare/BCBS   PT Start Time [id number]   PT Stop Time 1400   PT Time Calculation (min) 43 min   Activity Tolerance Patient tolerated treatment well   Behavior During Therapy [facility] for tasks assessed/performed      Past Medical History:  Diagnosis Date  . Abdominal aortic aneurysm ([place]) 09/2008   s/o endovascular repair and angiogram 10/04/08   . Anemia    EGD Cscope 08-2012  . Bleeding ulcer 1997  . CKD (chronic kidney disease), stage IV (HCC)    Stable, Dr. Dimas Aguas  . COPD (chronic obstructive pulmonary disease) ([place])   . H/O hyperkalemia    Stable  . Hypertension   . Leukopenia 2/11   thought to be from doxy  . Renal cell carcinoma    clear cell type dx 10/09, s/p excision 10-24-08  . Renal insufficiency   . Solitary left kidney    W/ chronic tubule-interstitial damage, Dr. Dimas Aguas  . Spinal stenosis    causing LE weakness-persistant, being worked up by Hydrographic surveyor and neurologist    Past Surgical History:  Procedure Laterality Date  . ABDOMINAL AORTIC ANEURYSM REPAIR  2009   EVAR  . [place]  . [place]), 2004  . NEPHRECTOMY  2009   Open nephrectomy for cancer    There were no vitals filed for this visit.      Subjective Assessment - 11/09/16 1321    Subjective Pt. reporting continue tiredness in B LE's today.    Patient Stated Goals "less pain in back"   Currently in Pain? No/denies   Pain Score 0-No pain   Multiple Pain Sites No             [facility] PT Assessment - 11/09/16 1340      Observation/Other Assessments   Focus on Therapeutic Outcomes (FOTO)  49 % (51% limitation)     Strength   Strength Assessment Site Hip;Knee;Ankle   Right/Left Hip Right;Left   Right Hip Flexion 4/5   Right Hip Extension 4/5   Right Hip ABduction 4+/5   Right Hip ADduction 4+/5   Left Hip Flexion 4/5   Left Hip Extension 4/5   Left Hip ABduction 4+/5   Left Hip ADduction 4+/5   Right/Left Knee Right;Left   Right Knee Flexion 4/5   Right Knee Extension 4/5   Left Knee Flexion 4/5   Left Knee Extension 4/5   Right/Left Ankle Right;Left   Right Ankle Dorsiflexion 4/5   Right Ankle Plantar Flexion 4+/5   Left Ankle Dorsiflexion 4/5   Left Ankle Plantar Flexion 4+/5     Berg Balance Test   Sit to Stand Able to stand without using hands and stabilize independently   Standing Unsupported Able to stand 2 minutes with supervision   Sitting with Back Unsupported but Feet Supported on Floor or Stool Able to sit safely and securely 2 minutes   Stand to Sit Controls descent by using hands  Transfers Able to transfer safely, minor use of hands   Standing Unsupported with Eyes Closed Able to stand 10 seconds with supervision   Standing Ubsupported with Feet Together Able to place feet together independently and stand for 1 minute with supervision   From Standing, Reach Forward with Outstretched Arm Can reach forward >5 cm safely (2")   From Standing Position, Pick up Object from [place] to pick up shoe, needs supervision   From Standing Position, Turn to Look Behind Over each Shoulder Turn sideways only but maintains balance   Turn 360 Degrees Able to turn 360 degrees safely one side only in 4 seconds or less   Standing Unsupported, Alternately Place Feet on Step/Stool Able to stand independently and complete 8 steps >20 seconds   Standing Unsupported, One Foot in Front Able to take small step independently and hold 30 seconds   Standing on One  Leg Tries to lift leg/unable to hold 3 seconds but remains standing independently   Total Score 40         Today's treatment:   Therex: Treadmill: lvl 1.0, 4 min; verbal cues provided for long step length   Goal testing   MMT testing   BERG balance Test          PT Short Term Goals - 10/25/16 1321      PT SHORT TERM GOAL #1   Title Pt will be independent with lumbar HEP by 10/29/16   Status Achieved           PT Long Term Goals - 11/09/16 1339      PT LONG TERM GOAL #1   Title Pt will be independent with advanced lumbar +/- balance HEP by 12/03/16   Status Achieved  11.14.17: Pt. reports he feels like he is doing well with HEP.  Daughter reports HEP activities for lumbar and balance are going well without questions today.       PT LONG TERM GOAL #2   Title B LE strength to >/= 4=/5 for improved function by 12/03/16   Status Achieved     PT LONG TERM GOAL #3   Title Pt will demonstrate improvement on the Berg Balance Scale to 49/56 or greater to reduce risk for falls by 12/03/16   Status On-going  11.14.17: 40/56 BERG score      PT LONG TERM GOAL #4   Title Pt will improve gait speed to 2.5 ft/sec or greater for improved safety with community ambulation by 12/03/16   Status On-going  11.14.17: not tested today due to time constraints.               Plan - 11/09/16 1407    Clinical Impression Statement Pt. tolerated all standing balance activity well today with chief complaint being B LE weakness today.  Pt. able to improve BERG balance score to 40/56 from 38/56 as compared to when tested on 10/07/16.  Pt. LE muscular strength nearly unchanged since being tested on 10/07/16 with slight decreased in B knee extension strength.  Pt. still with shortened step length however able to demo improved speed with ability to turn in circle with BERG balance testing.  Pt. still very limited in ability to reach (only reaching 3" safely today) with Berg balance testing.   Pt. and daughter reporting HEP is going well at this point without issue.  Pt. has now been pain free in low back for 3 visits.     PT Treatment/Interventions Patient/family education;Neuromuscular re-education;Therapeutic exercise;Manual  techniques;Therapeutic activities;Functional mobility training;Balance training;Gait training;Moist Heat;Cryotherapy;Electrical Stimulation;ADLs/Self Care Home Management   PT Next Visit Plan Lumbar stabilization/strengthening; Balance/coordination training including PWR! Moves or [place] as appropriate; Manual therapy &/or modalities PRN for pain      Patient will benefit from skilled therapeutic intervention in order to improve the following deficits and impairments:  Pain, Postural dysfunction, Decreased strength, Decreased activity tolerance, Difficulty walking, Decreased balance, Abnormal gait, Decreased coordination, Decreased endurance  Visit Diagnosis: Chronic left-sided low back pain with left-sided sciatica  Unsteadiness on feet  Unspecified lack of coordination  Difficulty in walking, not elsewhere classified  Other abnormalities of gait and mobility  Muscle weakness (generalized)       G-Codes - 12/09/16 1756    Functional Assessment Tool Used Lumbar Spine FOTO = 49% (51% limitation), Berg = 40/56, & clinical judgement    Functional Limitation Mobility: Walking and moving around   Mobility: Walking and Moving Around Current Status [phone]) At least 40 percent but less than 60 percent impaired, limited or restricted   Mobility: Walking and Moving Around Goal Status [phone]) At least 20 percent but less than 40 percent impaired, limited or restricted      Problem List Patient Active Problem List   Diagnosis Date Noted  . Follow-up --- PCP NOTES 09/02/2015  . Neuropathy ([place]) 06/10/2015  . Pedal edema 01/30/2015  . Hip pain, chronic 01/30/2015  . Back pain 01/30/2015  . Cramps of lower extremity 01/30/2015  . Aftercare following surgery  of the circulatory system, [place] 08/05/2014  . Annual physical exam 10/19/2011  . DJD (degenerative joint disease) 12/14/2010  . Anemia 06/06/2009  . COPD (chronic obstructive pulmonary disease) ([place]) 06/06/2009  . RENAL CELL CANCER 12/25/2008  . HTN (hypertension) 12/25/2008  . AAA (abdominal aortic aneurysm) without rupture ([place]) 12/25/2008  . CKD (chronic kidney disease) 12/25/2008  . HYPERCHOLESTEROLEMIA 03/01/2008  . GERD 03/01/2008  . ACNE ROSACEA 11/29/2007  . DIVERTICULOSIS, COLON 04/12/2007    Bess Harvest, PTA 2016-12-09 6:01 PM  [facility] [address]  [place], [place], [address] Phone: [phone]   Fax:  [phone]  Name: [name] MRN: [id number] Date of Birth: 04/05/1927  Percival Spanish, PT, MPT 2016/12/09, 6:01 PM  [facility] [address]  [place], [place], [address] Phone: [phone]   Fax:  [phone]

## 2016-11-11 ENCOUNTER — Ambulatory Visit: Payer: Medicare Other | Admitting: Physical Therapy

## 2016-11-11 DIAGNOSIS — R262 Difficulty in walking, not elsewhere classified: Secondary | ICD-10-CM | POA: Diagnosis not present

## 2016-11-11 DIAGNOSIS — R2689 Other abnormalities of gait and mobility: Secondary | ICD-10-CM

## 2016-11-11 DIAGNOSIS — R279 Unspecified lack of coordination: Secondary | ICD-10-CM | POA: Diagnosis not present

## 2016-11-11 DIAGNOSIS — M5442 Lumbago with sciatica, left side: Principal | ICD-10-CM

## 2016-11-11 DIAGNOSIS — G8929 Other chronic pain: Secondary | ICD-10-CM | POA: Diagnosis not present

## 2016-11-11 DIAGNOSIS — R2681 Unsteadiness on feet: Secondary | ICD-10-CM | POA: Diagnosis not present

## 2016-11-11 DIAGNOSIS — M6281 Muscle weakness (generalized): Secondary | ICD-10-CM

## 2016-11-11 NOTE — Therapy (Signed)
[facility] [address]  [place], [place], [address] Phone: [phone]   Fax:  [phone]  Physical Therapy Treatment  Patient Details  Name: [name] MRN: [id number] Date of Birth: 29-Dec-1926 Referring Provider: Kathlene November, MD  Encounter Date: 11/11/2016      PT End of Session - 11/11/16 1306    Visit Number 11   Number of Visits 16   Date for PT Re-Evaluation 12/03/16   Authorization Type Medicare/BCBS   PT Start Time 1306   PT Stop Time 1402   PT Time Calculation (min) 56 min   Activity Tolerance Patient tolerated treatment well   Behavior During Therapy [facility] for tasks assessed/performed      Past Medical History:  Diagnosis Date  . Abdominal aortic aneurysm ([place]) 09/2008   s/o endovascular repair and angiogram 10/04/08   . Anemia    EGD Cscope 08-2012  . Bleeding ulcer 1997  . CKD (chronic kidney disease), stage IV (HCC)    Stable, Dr. Dimas Aguas  . COPD (chronic obstructive pulmonary disease) ([place])   . H/O hyperkalemia    Stable  . Hypertension   . Leukopenia 2/11   thought to be from doxy  . Renal cell carcinoma    clear cell type dx 10/09, s/p excision 10-24-08  . Renal insufficiency   . Solitary left kidney    W/ chronic tubule-interstitial damage, Dr. Dimas Aguas  . Spinal stenosis    causing LE weakness-persistant, being worked up by Hydrographic surveyor and neurologist    Past Surgical History:  Procedure Laterality Date  . ABDOMINAL AORTIC ANEURYSM REPAIR  2009   EVAR  . [place]  . [place] ([place]), 2004  . NEPHRECTOMY  2009   Open nephrectomy for cancer    There were no vitals filed for this visit.      Subjective Assessment - 11/11/16 1309    Subjective No pain, just tired.   Patient Stated Goals "less pain in back"   Currently in Pain? No/denies                 [facility] Adult PT Treatment/Exercise - 11/11/16 1306      Ambulation/Gait    Ambulation/Gait Assistance 4: Min guard;5: Supervision   Ambulation/Gait Assistance Details Bil & single HHA at times to promote coordination of arm swing and guard against LOB   Assistive device None   Gait Pattern Trunk flexed;Shuffle;Decreased stride length;Decreased step length - right;Decreased step length - left;Decreased arm swing - right;Decreased arm swing - left   Gait Comments Working on upright posture, longer stride/step length and increasing recpirocal arm swing.     Exercises   Exercises Lumbar     Lumbar Exercises: Aerobic   Tread Mill 1.4 mph x 6'           PWR [facility]) - 11/11/16 1341    PWR! exercises Moves in sitting;Functional moves   PWR! Up x10  modified quadruped with chair in front   PWR! Rock 2x5   PWR! Twist x10   PWR! Step Through Forward/Back fwd/back x 10 each   PWR! Up 2x10  sitting   PWR! Rock [employer]! Twist 2x5   PWR! Step x20               PT Short Term Goals - 10/25/16 1321      PT SHORT TERM GOAL #1   Title Pt  will be independent with lumbar HEP by 10/29/16   Status Achieved           PT Long Term Goals - 11/09/16 1339      PT LONG TERM GOAL #1   Title Pt will be independent with advanced lumbar +/- balance HEP by 12/03/16   Status Achieved  11.14.17: Pt. reports he feels like he is doing well with HEP.  Daughter reports HEP activities for lumbar and balance are going well without questions today.       PT LONG TERM GOAL #2   Title B LE strength to >/= 4=/5 for improved function by 12/03/16   Status Achieved     PT LONG TERM GOAL #3   Title Pt will demonstrate improvement on the Berg Balance Scale to 49/56 or greater to reduce risk for falls by 12/03/16   Status On-going  11.14.17: 40/56 BERG score      PT LONG TERM GOAL #4   Title Pt will improve gait speed to 2.5 ft/sec or greater for improved safety with community ambulation by 12/03/16   Status On-going  11.14.17: not tested today due to time constraints.                Plan - 11/11/16 1311    Clinical Impression Statement Pt remains w/o c/o LBP but continues to report "tired" feeling and his dtr continues to be concerned about shuffling gait pattern, therefore targeted upright posture and larger steps with reciprocal arm swing. Utilized PWR! Moves in sitting with emphasis on PWR!ing Up for good posture and PWR! Step for increasing ease of car transfers. Attempted modified quadruped PWR! Moves but pt having difficulty with these.   Rehab Potential Good   Clinical Impairments Affecting Rehab Potential Advanced age, HTN, COPD, CKD, LE neuropathy   PT Treatment/Interventions Patient/family education;Neuromuscular re-education;Therapeutic exercise;Manual techniques;Therapeutic activities;Functional mobility training;Balance training;Gait training;Moist Heat;Cryotherapy;Electrical Stimulation;ADLs/Self Care Home Management   PT Next Visit Plan Lumbar stabilization/strengthening; Balance/coordination training including PWR! Moves or [place] as appropriate; Manual therapy &/or modalities PRN for pain   Consulted and Agree with Plan of Care Patient;Family member/caregiver   Family Member Consulted Dtr - [name]      Patient will benefit from skilled therapeutic intervention in order to improve the following deficits and impairments:  Pain, Postural dysfunction, Decreased strength, Decreased activity tolerance, Difficulty walking, Decreased balance, Abnormal gait, Decreased coordination, Decreased endurance  Visit Diagnosis: Chronic left-sided low back pain with left-sided sciatica  Unsteadiness on feet  Unspecified lack of coordination  Difficulty in walking, not elsewhere classified  Other abnormalities of gait and mobility  Muscle weakness (generalized)     Problem List Patient Active Problem List   Diagnosis Date Noted  . Follow-up --- PCP NOTES 09/02/2015  . Neuropathy ([place]) 06/10/2015  . Pedal edema 01/30/2015  . Hip pain, chronic  01/30/2015  . Back pain 01/30/2015  . Cramps of lower extremity 01/30/2015  . Aftercare following surgery of the circulatory system, [place] 08/05/2014  . Annual physical exam 10/19/2011  . DJD (degenerative joint disease) 12/14/2010  . Anemia 06/06/2009  . COPD (chronic obstructive pulmonary disease) ([place]) 06/06/2009  . RENAL CELL CANCER 12/25/2008  . HTN (hypertension) 12/25/2008  . AAA (abdominal aortic aneurysm) without rupture ([place]) 12/25/2008  . CKD (chronic kidney disease) 12/25/2008  . HYPERCHOLESTEROLEMIA 03/01/2008  . GERD 03/01/2008  . ACNE ROSACEA 11/29/2007  . DIVERTICULOSIS, COLON 04/12/2007    Percival Spanish, PT, MPT 11/11/2016, 2:30 PM  [facility]  [facility] [address]  [place], [place], [address] Phone: [phone]   Fax:  [phone]  Name: [name] MRN: [id number] Date of Birth: [birth year]

## 2016-11-16 ENCOUNTER — Ambulatory Visit: Payer: Medicare Other | Admitting: Physical Therapy

## 2016-11-16 DIAGNOSIS — R262 Difficulty in walking, not elsewhere classified: Secondary | ICD-10-CM | POA: Diagnosis not present

## 2016-11-16 DIAGNOSIS — M5442 Lumbago with sciatica, left side: Secondary | ICD-10-CM | POA: Diagnosis not present

## 2016-11-16 DIAGNOSIS — G8929 Other chronic pain: Secondary | ICD-10-CM | POA: Diagnosis not present

## 2016-11-16 DIAGNOSIS — M6281 Muscle weakness (generalized): Secondary | ICD-10-CM

## 2016-11-16 DIAGNOSIS — R2681 Unsteadiness on feet: Secondary | ICD-10-CM | POA: Diagnosis not present

## 2016-11-16 DIAGNOSIS — R2689 Other abnormalities of gait and mobility: Secondary | ICD-10-CM

## 2016-11-16 DIAGNOSIS — R279 Unspecified lack of coordination: Secondary | ICD-10-CM | POA: Diagnosis not present

## 2016-11-16 NOTE — Therapy (Signed)
[facility] [address]  [place], [place], [address] Phone: [phone]   Fax:  [phone]  Physical Therapy Treatment  Patient Details  Name: [name] MRN: [id number] Date of Birth: [birth year] Referring Provider: Kathlene November, MD  Encounter Date: 11/16/2016      PT End of Session - 11/16/16 1401    Visit Number 12   Number of Visits 16   Date for PT Re-Evaluation 12/03/16   Authorization Type Medicare/BCBS   PT Start Time 1401   PT Stop Time 1447   PT Time Calculation (min) 46 min   Activity Tolerance Patient tolerated treatment well   Behavior During Therapy [facility] for tasks assessed/performed      Past Medical History:  Diagnosis Date  . Abdominal aortic aneurysm ([place]) 09/2008   s/o endovascular repair and angiogram 10/04/08   . Anemia    EGD Cscope 08-2012  . Bleeding ulcer 1997  . CKD (chronic kidney disease), stage IV (HCC)    Stable, Dr. Dimas Aguas  . COPD (chronic obstructive pulmonary disease) ([place])   . H/O hyperkalemia    Stable  . Hypertension   . Leukopenia 2/11   thought to be from doxy  . Renal cell carcinoma    clear cell type dx 10/09, s/p excision 10-24-08  . Renal insufficiency   . Solitary left kidney    W/ chronic tubule-interstitial damage, Dr. Dimas Aguas  . Spinal stenosis    causing LE weakness-persistant, being worked up by Hydrographic surveyor and neurologist    Past Surgical History:  Procedure Laterality Date  . ABDOMINAL AORTIC ANEURYSM REPAIR  2009   EVAR  . [place]  . [place] (lip), 2004  . NEPHRECTOMY  2009   Open nephrectomy for cancer    There were no vitals filed for this visit.      Subjective Assessment - 11/16/16 1406    Subjective Continues to deny pain, just tired.   Patient Stated Goals "less pain in back"   Currently in Pain? No/denies                   [facility] Adult PT Treatment/Exercise - 11/16/16 1401      Ambulation/Gait   Ambulation/Gait Assistance 5: Supervision   Ambulation Distance (Feet) 100 Feet   Gait Pattern Trunk flexed;Shuffle;Decreased stride length;Decreased step length - right;Decreased step length - left;Decreased arm swing - right;Decreased arm swing - left   Gait Comments Promoted carryover of weight shift and larger step pattern from PWR! Moves into gait      Lumbar Exercises: Aerobic   Tread Mill 1.4 mph x 6'           [facility]) - 11/16/16 1401    PWR! exercises Moves in sitting;Moves in standing;Functional moves   PWR! Up x10  standing at back of chair   PWR! Rock x10   PWR! Twist x10   PWR Step x10   PWR! Step Through Forward/Back fwd/back & combined fwd/back x 10 each   2 sets for combined fwd/back   PWR! Up x20  siting   PWR! Rock [employer]! Twist x10   PWR! Step x20               PT Short Term Goals - 10/25/16 1321      PT SHORT TERM GOAL #1   Title Pt will be independent with lumbar HEP by 10/29/16  Status Achieved           PT Long Term Goals - 11/09/16 1339      PT LONG TERM GOAL #1   Title Pt will be independent with advanced lumbar +/- balance HEP by 12/03/16   Status Achieved  11.14.17: Pt. reports he feels like he is doing well with HEP.  Daughter reports HEP activities for lumbar and balance are going well without questions today.       PT LONG TERM GOAL #2   Title B LE strength to >/= 4=/5 for improved function by 12/03/16   Status Achieved     PT LONG TERM GOAL #3   Title Pt will demonstrate improvement on the Berg Balance Scale to 49/56 or greater to reduce risk for falls by 12/03/16   Status On-going  11.14.17: 40/56 BERG score      PT LONG TERM GOAL #4   Title Pt will improve gait speed to 2.5 ft/sec or greater for improved safety with community ambulation by 12/03/16   Status On-going  11.14.17: not tested today due to time constraints.               Plan - 11/16/16 1447    Clinical Impression  Statement Continued training in PWR! Moves to promote upright posture, weight shift and increased step length. Provided HEP instructions for Sitting & Standing PWR! Moves along with Walking Program moves and reviewed these with pt and dtr. Wil l f/u for futher review and training as indicated.   Rehab Potential Good   Clinical Impairments Affecting Rehab Potential Advanced age, HTN, COPD, CKD, LE neuropathy   PT Treatment/Interventions Patient/family education;Neuromuscular re-education;Therapeutic exercise;Manual techniques;Therapeutic activities;Functional mobility training;Balance training;Gait training;Moist Heat;Cryotherapy;Electrical Stimulation;ADLs/Self Care Home Management   PT Next Visit Plan Lumbar stabilization/strengthening; Balance/coordination training including PWR! Moves or [place] as appropriate; Manual therapy &/or modalities PRN for pain   Consulted and Agree with Plan of Care Patient;Family member/caregiver   Family Member Consulted Dtr - [name]      Patient will benefit from skilled therapeutic intervention in order to improve the following deficits and impairments:  Pain, Postural dysfunction, Decreased strength, Decreased activity tolerance, Difficulty walking, Decreased balance, Abnormal gait, Decreased coordination, Decreased endurance  Visit Diagnosis: Chronic left-sided low back pain with left-sided sciatica  Unsteadiness on feet  Unspecified lack of coordination  Difficulty in walking, not elsewhere classified  Other abnormalities of gait and mobility  Muscle weakness (generalized)     Problem List Patient Active Problem List   Diagnosis Date Noted  . Follow-up --- PCP NOTES 09/02/2015  . Neuropathy ([place]) 06/10/2015  . Pedal edema 01/30/2015  . Hip pain, chronic 01/30/2015  . Back pain 01/30/2015  . Cramps of lower extremity 01/30/2015  . Aftercare following surgery of the circulatory system, [place] 08/05/2014  . Annual physical exam 10/19/2011  . DJD  (degenerative joint disease) 12/14/2010  . Anemia 06/06/2009  . COPD (chronic obstructive pulmonary disease) ([place]) 06/06/2009  . RENAL CELL CANCER 12/25/2008  . HTN (hypertension) 12/25/2008  . AAA (abdominal aortic aneurysm) without rupture ([place]) 12/25/2008  . CKD (chronic kidney disease) 12/25/2008  . HYPERCHOLESTEROLEMIA 03/01/2008  . GERD 03/01/2008  . ACNE ROSACEA 11/29/2007  . DIVERTICULOSIS, COLON 04/12/2007    Percival Spanish, PT, MPT 11/16/2016, 4:10 PM  [facility] [address]  [place], [place], [address] Phone: [phone]   Fax:  [phone]  Name: JAMEEK DITOMMASO MRN: [id number] Date of  Birth: 26-Jul-1927

## 2016-11-23 ENCOUNTER — Ambulatory Visit: Payer: Medicare Other | Admitting: Physical Therapy

## 2016-11-23 DIAGNOSIS — R279 Unspecified lack of coordination: Secondary | ICD-10-CM | POA: Diagnosis not present

## 2016-11-23 DIAGNOSIS — G8929 Other chronic pain: Secondary | ICD-10-CM

## 2016-11-23 DIAGNOSIS — R2681 Unsteadiness on feet: Secondary | ICD-10-CM

## 2016-11-23 DIAGNOSIS — R262 Difficulty in walking, not elsewhere classified: Secondary | ICD-10-CM

## 2016-11-23 DIAGNOSIS — R2689 Other abnormalities of gait and mobility: Secondary | ICD-10-CM | POA: Diagnosis not present

## 2016-11-23 DIAGNOSIS — M6281 Muscle weakness (generalized): Secondary | ICD-10-CM

## 2016-11-23 DIAGNOSIS — M5442 Lumbago with sciatica, left side: Secondary | ICD-10-CM | POA: Diagnosis not present

## 2016-11-23 NOTE — Therapy (Signed)
[facility] [address]  [place], [place], [address] Phone: [phone]   Fax:  [phone]  Physical Therapy Treatment  Patient Details  Name: [name] MRN: [id number] Date of Birth: 1927-09-24 Referring Provider: Kathlene November, MD  Encounter Date: 11/23/2016      PT End of Session - 11/23/16 0842    Visit Number 13   Number of Visits 16   Date for PT Re-Evaluation 12/03/16   Authorization Type Medicare/BCBS   PT Start Time 0842   PT Stop Time 0934   PT Time Calculation (min) 52 min   Activity Tolerance Patient tolerated treatment well   Behavior During Therapy [facility] for tasks assessed/performed      Past Medical History:  Diagnosis Date  . Abdominal aortic aneurysm ([place]) 09/2008   s/o endovascular repair and angiogram 10/04/08   . Anemia    EGD Cscope 08-2012  . Bleeding ulcer 1997  . CKD (chronic kidney disease), stage IV (HCC)    Stable, Dr. Dimas Aguas  . COPD (chronic obstructive pulmonary disease) ([place])   . H/O hyperkalemia    Stable  . Hypertension   . Leukopenia 2/11   thought to be from doxy  . Renal cell carcinoma    clear cell type dx 10/09, s/p excision 10-24-08  . Renal insufficiency   . Solitary left kidney    W/ chronic tubule-interstitial damage, Dr. Dimas Aguas  . Spinal stenosis    causing LE weakness-persistant, being worked up by Hydrographic surveyor and neurologist    Past Surgical History:  Procedure Laterality Date  . ABDOMINAL AORTIC ANEURYSM REPAIR  2009   EVAR  . [place]  . [place] ([place]), 2004  . NEPHRECTOMY  2009   Open nephrectomy for cancer    There were no vitals filed for this visit.      Subjective Assessment - 11/23/16 0845    Subjective Pt purchased new shoes which appear to be too large for pt and dtr expressed concerns about this.   Patient Stated Goals "less pain in back"   Currently in Pain? No/denies                   [facility] Adult PT Treatment/Exercise - 11/23/16 0842      Ambulation/Gait   Ambulation/Gait Assistance 5: Supervision   Ambulation Distance (Feet) 500 Feet   Gait Comments Promoted carryover of weight shift and larger step pattern from PWR! Moves into gait      High Level Balance   High Level Balance Activities Backward walking     Lumbar Exercises: Aerobic   Tread Mill 1.4 mph x 6'           PWR [facility]) - 11/23/16 [id number]    PWR! exercises Moves in sitting;Moves in standing;Functional moves   PWR! Up x20  standing at back of chair   PWR! Rock [employer]! Twist x20   PWR Step x20   PWR! Step Through Forward/Back fwd & back individually x10 each, combined fwd/back x20    PWR! Sit to Stand x10   PWR! Up x20  sitting in chair w/o arms   PWR! Rock [employer]! Twist x20   PWR! Step x20               PT Short Term Goals - 10/25/16 1321      PT SHORT TERM GOAL #1  Title Pt will be independent with lumbar HEP by 10/29/16   Status Achieved           PT Long Term Goals - 11/23/16 0847      PT LONG TERM GOAL #1   Title Pt will be independent with advanced lumbar +/- balance HEP by 12/03/16   Status Achieved     PT LONG TERM GOAL #2   Title B LE strength to >/= 4=/5 for improved function by 12/03/16   Status Achieved     PT LONG TERM GOAL #3   Title Pt will demonstrate improvement on the Berg Balance Scale to 49/56 or greater to reduce risk for falls by 12/03/16   Status On-going     PT LONG TERM GOAL #4   Title Pt will improve gait speed to 2.5 ft/sec or greater for improved safety with community ambulation by 12/03/16   Status On-going               Plan - 11/23/16 0848    Clinical Impression Statement Pt presents to PT with new shoes that appear to be at least 1 size too large creating a finger width gap behind the heel. Pt insistent that the shoes are the correct size but able to convince the pt to allow the PT to tightnen the  laces toward the toes box to reduce the forward translation of his feet in the shoes and decrease slippage of the shoes while ambulating to reduce fall risk. Reviewed PWR! moves in sitting and standing reinforcing carryover of movment patterns into normal daily acitvities and gait.   Rehab Potential Good   Clinical Impairments Affecting Rehab Potential Advanced age, HTN, COPD, CKD, LE neuropathy   PT Treatment/Interventions Patient/family education;Neuromuscular re-education;Therapeutic exercise;Manual techniques;Therapeutic activities;Functional mobility training;Balance training;Gait training;Moist Heat;Cryotherapy;Electrical Stimulation;ADLs/Self Care Home Management   PT Next Visit Plan Re-check gait speed; Lumbar stabilization/strengthening; Balance/coordination training including review of PWR! Moves; Manual therapy &/or modalities PRN for pain   Consulted and Agree with Plan of Care Patient;Family member/caregiver   Family Member Consulted Dtr - [name]      Patient will benefit from skilled therapeutic intervention in order to improve the following deficits and impairments:  Pain, Postural dysfunction, Decreased strength, Decreased activity tolerance, Difficulty walking, Decreased balance, Abnormal gait, Decreased coordination, Decreased endurance  Visit Diagnosis: Chronic left-sided low back pain with left-sided sciatica  Unsteadiness on feet  Unspecified lack of coordination  Difficulty in walking, not elsewhere classified  Other abnormalities of gait and mobility  Muscle weakness (generalized)     Problem List Patient Active Problem List   Diagnosis Date Noted  . Follow-up --- PCP NOTES 09/02/2015  . Neuropathy ([place]) 06/10/2015  . Pedal edema 01/30/2015  . Hip pain, chronic 01/30/2015  . Back pain 01/30/2015  . Cramps of lower extremity 01/30/2015  . Aftercare following surgery of the circulatory system, [place] 08/05/2014  . Annual physical exam 10/19/2011  . DJD  (degenerative joint disease) 12/14/2010  . Anemia 06/06/2009  . COPD (chronic obstructive pulmonary disease) ([place]) 06/06/2009  . RENAL CELL CANCER 12/25/2008  . HTN (hypertension) 12/25/2008  . AAA (abdominal aortic aneurysm) without rupture ([place]) 12/25/2008  . CKD (chronic kidney disease) 12/25/2008  . HYPERCHOLESTEROLEMIA 03/01/2008  . GERD 03/01/2008  . ACNE ROSACEA 11/29/2007  . DIVERTICULOSIS, COLON 04/12/2007    Percival Spanish, PT, MPT 11/23/2016, 9:54 AM  [facility] [place], [place], [address] Phone:  [phone]   Fax:  [phone]  Name: [name] MRN: [id number] Date of Birth: [birth year]

## 2016-11-25 ENCOUNTER — Ambulatory Visit: Payer: Medicare Other | Admitting: Physical Therapy

## 2016-11-25 DIAGNOSIS — G8929 Other chronic pain: Secondary | ICD-10-CM

## 2016-11-25 DIAGNOSIS — M6281 Muscle weakness (generalized): Secondary | ICD-10-CM

## 2016-11-25 DIAGNOSIS — R279 Unspecified lack of coordination: Secondary | ICD-10-CM

## 2016-11-25 DIAGNOSIS — R262 Difficulty in walking, not elsewhere classified: Secondary | ICD-10-CM | POA: Diagnosis not present

## 2016-11-25 DIAGNOSIS — R2681 Unsteadiness on feet: Secondary | ICD-10-CM

## 2016-11-25 DIAGNOSIS — M5442 Lumbago with sciatica, left side: Secondary | ICD-10-CM | POA: Diagnosis not present

## 2016-11-25 DIAGNOSIS — R2689 Other abnormalities of gait and mobility: Secondary | ICD-10-CM

## 2016-11-25 NOTE — Therapy (Signed)
[facility] [address]  [place], [place], [address] Phone: [phone]   Fax:  [phone]  Physical Therapy Treatment  Patient Details  Name: [name] MRN: [id number] Date of Birth: [birth year] Referring Provider: Kathlene November, MD  Encounter Date: 11/25/2016      PT End of Session - 11/25/16 1315    Visit Number 14   Number of Visits 16   Date for PT Re-Evaluation 12/03/16   Authorization Type Medicare/BCBS   PT Start Time 1315   PT Stop Time 1400   PT Time Calculation (min) 45 min   Activity Tolerance Patient tolerated treatment well   Behavior During Therapy [facility] for tasks assessed/performed      Past Medical History:  Diagnosis Date  . Abdominal aortic aneurysm ([place]) 09/2008   s/o endovascular repair and angiogram 10/04/08   . Anemia    EGD Cscope 08-2012  . Bleeding ulcer 1997  . CKD (chronic kidney disease), stage IV (HCC)    Stable, Dr. Dimas Aguas  . COPD (chronic obstructive pulmonary disease) ([place])   . H/O hyperkalemia    Stable  . Hypertension   . Leukopenia 2/11   thought to be from doxy  . Renal cell carcinoma    clear cell type dx 10/09, s/p excision 10-24-08  . Renal insufficiency   . Solitary left kidney    W/ chronic tubule-interstitial damage, Dr. Dimas Aguas  . Spinal stenosis    causing LE weakness-persistant, being worked up by Hydrographic surveyor and neurologist    Past Surgical History:  Procedure Laterality Date  . ABDOMINAL AORTIC ANEURYSM REPAIR  2009   EVAR  . [place]  . [place] ([place]), 2004  . NEPHRECTOMY  2009   Open nephrectomy for cancer    There were no vitals filed for this visit.      Subjective Assessment - 11/25/16 1324    Subjective Pt reports he switched back to his old shoes because the new shoes were too tight to slide on after the laces were adjusted last visit.   Patient Stated Goals "less pain in back"   Currently in  Pain? No/denies            [facility] PT Assessment - 11/25/16 1315      Standardized Balance Assessment   10 Meter Walk 2.74 ft/sec  11.97"                  [facility] Adult PT Treatment/Exercise - 11/25/16 1315      Lumbar Exercises: Aerobic   Tread Mill 1.5 mph x 6'           PWR [facility]) - 11/25/16 1315    PWR! exercises Moves in sitting;Moves in standing;Functional moves   PWR! Up x20  standing at back of chair   PWR! Rock [employer]! Twist x20  modified   PWR Step x20   PWR! Step Through Forward/Back fwd & back individually x10 each, combined fwd/back x20    PWR! Sit to Stand x10   PWR! Up x20  sitting in chair w/o armrests   PWR! Rock [employer]! Twist x20   PWR! Step x20               PT Short Term Goals - 10/25/16 1321      PT SHORT TERM GOAL #1   Title Pt will be independent with lumbar HEP  by 10/29/16   Status Achieved           PT Long Term Goals - 11/25/16 1331      PT LONG TERM GOAL #1   Title Pt will be independent with advanced lumbar +/- balance HEP by 12/03/16   Status Achieved     PT LONG TERM GOAL #2   Title B LE strength to >/= 4=/5 for improved function by 12/03/16   Status Achieved     PT LONG TERM GOAL #3   Title Pt will demonstrate improvement on the Berg Balance Scale to 49/56 or greater to reduce risk for falls by 12/03/16   Status On-going     PT LONG TERM GOAL #4   Title Pt will improve gait speed to 2.5 ft/sec or greater for improved safety with community ambulation by 12/03/16   Status Achieved               Plan - 11/25/16 1326    Clinical Impression Statement Pt demonstrating improved gait speed by .8 ft/sec to 2.74 ft/sec indicating improving safety with community ambulation. Pt nearing end of current POC, therefore reviewed HEP activities. Pt denied any issues with low back HEP and has not had any recent LBP, but continues to "tired" sesnsation in LE's. Pt demonstrating better recall of PWR! Moves but  still struggles with "Twist" motions. Requested pt and dtr to come to next session with any questions regarding HEPs.   Rehab Potential Good   Clinical Impairments Affecting Rehab Potential Advanced age, HTN, COPD, CKD, LE neuropathy   PT Treatment/Interventions Patient/family education;Neuromuscular re-education;Therapeutic exercise;Manual techniques;Therapeutic activities;Functional mobility training;Balance training;Gait training;Moist Heat;Cryotherapy;Electrical Stimulation;ADLs/Self Care Home Management   PT Next Visit Plan HEP review; Lumbar stabilization/strengthening; Balance/coordination training including review of PWR! Moves; Manual therapy &/or modalities PRN for pain   Consulted and Agree with Plan of Care Patient;Family member/caregiver   Family Member Consulted Dtr - [name]      Patient will benefit from skilled therapeutic intervention in order to improve the following deficits and impairments:  Pain, Postural dysfunction, Decreased strength, Decreased activity tolerance, Difficulty walking, Decreased balance, Abnormal gait, Decreased coordination, Decreased endurance  Visit Diagnosis: Chronic left-sided low back pain with left-sided sciatica  Unsteadiness on feet  Unspecified lack of coordination  Difficulty in walking, not elsewhere classified  Other abnormalities of gait and mobility  Muscle weakness (generalized)     Problem List Patient Active Problem List   Diagnosis Date Noted  . Follow-up --- PCP NOTES 09/02/2015  . Neuropathy ([place]) 06/10/2015  . Pedal edema 01/30/2015  . Hip pain, chronic 01/30/2015  . Back pain 01/30/2015  . Cramps of lower extremity 01/30/2015  . Aftercare following surgery of the circulatory system, [place] 08/05/2014  . Annual physical exam 10/19/2011  . DJD (degenerative joint disease) 12/14/2010  . Anemia 06/06/2009  . COPD (chronic obstructive pulmonary disease) ([place]) 06/06/2009  . RENAL CELL CANCER 12/25/2008  . HTN  (hypertension) 12/25/2008  . AAA (abdominal aortic aneurysm) without rupture ([place]) 12/25/2008  . CKD (chronic kidney disease) 12/25/2008  . HYPERCHOLESTEROLEMIA 03/01/2008  . GERD 03/01/2008  . ACNE ROSACEA 11/29/2007  . DIVERTICULOSIS, COLON 04/12/2007    Percival Spanish, PT, MPT 11/25/2016, 4:48 PM  [facility] [address]  [place], [place], [address] Phone: [phone]   Fax:  [phone]  Name: [name] MRN: [id number] Date of Birth: 1927/07/04

## 2016-11-29 DIAGNOSIS — Z85528 Personal history of other malignant neoplasm of kidney: Secondary | ICD-10-CM | POA: Diagnosis not present

## 2016-11-29 DIAGNOSIS — N400 Enlarged prostate without lower urinary tract symptoms: Secondary | ICD-10-CM | POA: Diagnosis not present

## 2016-11-30 ENCOUNTER — Ambulatory Visit: Payer: Medicare Other | Attending: Internal Medicine | Admitting: Physical Therapy

## 2016-11-30 DIAGNOSIS — R2689 Other abnormalities of gait and mobility: Secondary | ICD-10-CM | POA: Insufficient documentation

## 2016-11-30 DIAGNOSIS — G8929 Other chronic pain: Secondary | ICD-10-CM | POA: Diagnosis not present

## 2016-11-30 DIAGNOSIS — R279 Unspecified lack of coordination: Secondary | ICD-10-CM | POA: Diagnosis not present

## 2016-11-30 DIAGNOSIS — R2681 Unsteadiness on feet: Secondary | ICD-10-CM

## 2016-11-30 DIAGNOSIS — R262 Difficulty in walking, not elsewhere classified: Secondary | ICD-10-CM | POA: Diagnosis not present

## 2016-11-30 DIAGNOSIS — M5442 Lumbago with sciatica, left side: Secondary | ICD-10-CM | POA: Diagnosis not present

## 2016-11-30 DIAGNOSIS — M6281 Muscle weakness (generalized): Secondary | ICD-10-CM | POA: Insufficient documentation

## 2016-11-30 NOTE — Therapy (Signed)
[facility] [address]  [place], [place], [address] Phone: [phone]   Fax:  [phone]  Physical Therapy Treatment  Patient Details  Name: [name] MRN: [id number] Date of Birth: 09-21-1927 Referring Provider: Kathlene November, MD  Encounter Date: 11/30/2016      PT End of Session - 11/30/16 1440    Visit Number 15   Number of Visits 16   Date for PT Re-Evaluation 12/03/16   Authorization Type Medicare/BCBS   PT Start Time 1440   PT Stop Time 1535   PT Time Calculation (min) 55 min   Activity Tolerance Patient tolerated treatment well   Behavior During Therapy [facility] for tasks assessed/performed      Past Medical History:  Diagnosis Date  . Abdominal aortic aneurysm ([place]) 09/2008   s/o endovascular repair and angiogram 10/04/08   . Anemia    EGD Cscope 08-2012  . Bleeding ulcer 1997  . CKD (chronic kidney disease), stage IV (HCC)    Stable, Dr. Dimas Aguas  . COPD (chronic obstructive pulmonary disease) ([place])   . H/O hyperkalemia    Stable  . Hypertension   . Leukopenia 2/11   thought to be from doxy  . Renal cell carcinoma    clear cell type dx 10/09, s/p excision 10-24-08  . Renal insufficiency   . Solitary left kidney    W/ chronic tubule-interstitial damage, Dr. Dimas Aguas  . Spinal stenosis    causing LE weakness-persistant, being worked up by Hydrographic surveyor and neurologist    Past Surgical History:  Procedure Laterality Date  . ABDOMINAL AORTIC ANEURYSM REPAIR  2009   EVAR  . [place]  . [place] (lip), 2004  . NEPHRECTOMY  2009   Open nephrectomy for cancer    There were no vitals filed for this visit.      Subjective Assessment - 11/30/16 1440    Subjective Pt denies any recent back pain and feels like his legs "don't get as heavy recently", although still notes the "tired" sensation.   Patient Stated Goals "less pain in back"   Currently in Pain?  No/denies                  [facility] Adult PT Treatment/Exercise - 11/30/16 1440      Lumbar Exercises: Aerobic   Tread Mill 1.6 mph x 7'     Lumbar Exercises: Machines for Strengthening   Cybex Knee Extension 15# x15   Cybex Knee Flexion 20# x15   Other Lumbar Machine Exercise BATCA Low Row 15# x15           [facility]) - 11/30/16 1745    PWR! Up x10  standing at back of chair   PWR! Rock x10   PWR! Twist x10  modified   PWR Step x10   Comments Emphasized functional carryover: Up for posture, Rock for wt shift and reaching, Twist for reaching behind, Step for changing directions   PWR! Step Through Forward/Back fwd & back individually x10 each, combined fwd/back x20    PWR! Up x10  sitting in chair w/o armrests   PWR! Rock x10   PWR! Twist x10   PWR! Step x10   Comments Emphasized functional carryover: Up for posture, Rock for wt shift and reaching, Twist for reaching behind, Step for getting in/out of car or booth at [employer]  PT Education - 11/30/16 1535    Education provided Yes   Education Details Instruction in use of appropriate gym equipment   Person(s) Educated Patient;Child(ren)   Methods Explanation;Demonstration;Handout   Comprehension Verbalized understanding;Returned demonstration          PT Short Term Goals - 10/25/16 1321      PT SHORT TERM GOAL #1   Title Pt will be independent with lumbar HEP by 10/29/16   Status Achieved           PT Long Term Goals - 11/25/16 1331      PT LONG TERM GOAL #1   Title Pt will be independent with advanced lumbar +/- balance HEP by 12/03/16   Status Achieved     PT LONG TERM GOAL #2   Title B LE strength to >/= 4=/5 for improved function by 12/03/16   Status Achieved     PT LONG TERM GOAL #3   Title Pt will demonstrate improvement on the Berg Balance Scale to 49/56 or greater to reduce risk for falls by 12/03/16   Status On-going     PT LONG TERM GOAL #4   Title Pt will improve  gait speed to 2.5 ft/sec or greater for improved safety with community ambulation by 12/03/16   Status Achieved               Plan - 11/30/16 1535    Clinical Impression Statement Pt's dtr inquiring about gym activities that would be appropriate for pt to follow through with upon completion of PT, therefore demostrated use of rowing machine, along with knee flexion & extension machines. Also encourage treadmill speed of 1.5-1.6 mph as recent gait speed during standardized testing was equivalent to 1.87 mph. At pt's request, performed additional review of PWR! Moves emphasizing carryover into functional activities. Will plan to proceed with standardized testing and goal assessment on next visit with anticipated discharge from PT.   Rehab Potential Good   Clinical Impairments Affecting Rehab Potential Advanced age, HTN, COPD, CKD, LE neuropathy   PT Treatment/Interventions Patient/family education;Neuromuscular re-education;Therapeutic exercise;Manual techniques;Therapeutic activities;Functional mobility training;Balance training;Gait training;Moist Heat;Cryotherapy;Electrical Stimulation;ADLs/Self Care Home Management   PT Next Visit Plan Standardized testing/Goal assessment; Discharge   Consulted and Agree with Plan of Care Patient;Family member/caregiver   Family Member Consulted Dtr - [name]      Patient will benefit from skilled therapeutic intervention in order to improve the following deficits and impairments:  Pain, Postural dysfunction, Decreased strength, Decreased activity tolerance, Difficulty walking, Decreased balance, Abnormal gait, Decreased coordination, Decreased endurance  Visit Diagnosis: Chronic left-sided low back pain with left-sided sciatica  Unsteadiness on feet  Unspecified lack of coordination  Difficulty in walking, not elsewhere classified  Other abnormalities of gait and mobility  Muscle weakness (generalized)     Problem List Patient Active Problem  List   Diagnosis Date Noted  . Follow-up --- PCP NOTES 09/02/2015  . Neuropathy ([place]) 06/10/2015  . Pedal edema 01/30/2015  . Hip pain, chronic 01/30/2015  . Back pain 01/30/2015  . Cramps of lower extremity 01/30/2015  . Aftercare following surgery of the circulatory system, [place] 08/05/2014  . Annual physical exam 10/19/2011  . DJD (degenerative joint disease) 12/14/2010  . Anemia 06/06/2009  . COPD (chronic obstructive pulmonary disease) ([place]) 06/06/2009  . RENAL CELL CANCER 12/25/2008  . HTN (hypertension) 12/25/2008  . AAA (abdominal aortic aneurysm) without rupture ([place]) 12/25/2008  . CKD (chronic kidney disease) 12/25/2008  . HYPERCHOLESTEROLEMIA 03/01/2008  . GERD 03/01/2008  .  ACNE ROSACEA 11/29/2007  . DIVERTICULOSIS, COLON 04/12/2007    Percival Spanish, PT, MPT 11/30/2016, 5:50 PM  [facility] [address]  [place], [place], [address] Phone: [phone]   Fax:  [phone]  Name: [name] MRN: [id number] Date of Birth: 07/13/1927

## 2016-12-02 ENCOUNTER — Ambulatory Visit: Payer: Medicare Other | Admitting: Physical Therapy

## 2016-12-02 DIAGNOSIS — I10 Essential (primary) hypertension: Secondary | ICD-10-CM | POA: Diagnosis not present

## 2016-12-02 DIAGNOSIS — N183 Chronic kidney disease, stage 3 (moderate): Secondary | ICD-10-CM | POA: Diagnosis not present

## 2016-12-03 ENCOUNTER — Ambulatory Visit: Payer: Medicare Other | Admitting: Physical Therapy

## 2016-12-03 DIAGNOSIS — R279 Unspecified lack of coordination: Secondary | ICD-10-CM

## 2016-12-03 DIAGNOSIS — R262 Difficulty in walking, not elsewhere classified: Secondary | ICD-10-CM | POA: Diagnosis not present

## 2016-12-03 DIAGNOSIS — G8929 Other chronic pain: Secondary | ICD-10-CM | POA: Diagnosis not present

## 2016-12-03 DIAGNOSIS — R2689 Other abnormalities of gait and mobility: Secondary | ICD-10-CM | POA: Diagnosis not present

## 2016-12-03 DIAGNOSIS — R2681 Unsteadiness on feet: Secondary | ICD-10-CM | POA: Diagnosis not present

## 2016-12-03 DIAGNOSIS — M6281 Muscle weakness (generalized): Secondary | ICD-10-CM

## 2016-12-03 DIAGNOSIS — M5442 Lumbago with sciatica, left side: Principal | ICD-10-CM

## 2016-12-03 NOTE — Therapy (Signed)
[facility] [address]  [place], [place], [address] Phone: [phone]   Fax:  [phone]  Physical Therapy Treatment  Patient Details  Name: [name] MRN: [id number] Date of Birth: 02-07-1927 Referring Provider: Kathlene November, MD  Encounter Date: 12/03/2016      PT End of Session - 12/03/16 0800    Visit Number 16   Number of Visits 16   Date for PT Re-Evaluation 12/03/16   Authorization Type Medicare/BCBS   PT Start Time 0800   PT Stop Time 0845   PT Time Calculation (min) 45 min   Activity Tolerance Patient tolerated treatment well   Behavior During Therapy [facility] for tasks assessed/performed      Past Medical History:  Diagnosis Date  . Abdominal aortic aneurysm ([place]) 09/2008   s/o endovascular repair and angiogram 10/04/08   . Anemia    EGD Cscope 08-2012  . Bleeding ulcer 1997  . CKD (chronic kidney disease), stage IV (HCC)    Stable, Dr. Dimas Aguas  . COPD (chronic obstructive pulmonary disease) ([place])   . H/O hyperkalemia    Stable  . Hypertension   . Leukopenia 2/11   thought to be from doxy  . Renal cell carcinoma    clear cell type dx 10/09, s/p excision 10-24-08  . Renal insufficiency   . Solitary left kidney    W/ chronic tubule-interstitial damage, Dr. Dimas Aguas  . Spinal stenosis    causing LE weakness-persistant, being worked up by Hydrographic surveyor and neurologist    Past Surgical History:  Procedure Laterality Date  . ABDOMINAL AORTIC ANEURYSM REPAIR  2009   EVAR  . [place]  . [place] (lip), 2004  . NEPHRECTOMY  2009   Open nephrectomy for cancer    There were no vitals filed for this visit.      Subjective Assessment - 12/03/16 0802    Subjective Pt continues to deny any recent back pain, but still notes the "tired" sensation in his legs.   Patient Stated Goals "less pain in back"   Currently in Pain? No/denies            [facility] PT  Assessment - 12/03/16 0800      Assessment   Medical Diagnosis Low back pain d/t OA & spinal stenosis, Balance impairment   Referring Provider Kathlene November, MD   Next MD Visit 05/12/17     Observation/Other Assessments   Focus on Therapeutic Outcomes (FOTO)  Lumbar spine 57% (43% limitation)     Strength   Right Hip Flexion 4+/5   Right Hip Extension 4+/5   Right Hip ABduction 4+/5   Right Hip ADduction 4+/5   Left Hip Flexion 4+/5   Left Hip Extension 4+/5   Left Hip ABduction 4+/5   Left Hip ADduction 4+/5   Right Knee Flexion 4+/5   Right Knee Extension 4+/5   Left Knee Flexion 4+/5   Left Knee Extension 4+/5   Right Ankle Dorsiflexion 4+/5   Right Ankle Plantar Flexion 4+/5   Left Ankle Dorsiflexion 4+/5   Left Ankle Plantar Flexion 4+/5     Standardized Balance Assessment   Standardized Balance Assessment Berg Balance Test;Timed Up and Go Test;10 meter walk test   10 Meter Walk 2.84 ft/sec  11.54"     Berg Balance Test   Sit to Stand Able to stand without using hands and stabilize independently   Standing  Unsupported Able to stand safely 2 minutes   Sitting with Back Unsupported but Feet Supported on Floor or Stool Able to sit safely and securely 2 minutes   Stand to Sit Sits safely with minimal use of hands   Transfers Able to transfer safely, minor use of hands   Standing Unsupported with Eyes Closed Able to stand 10 seconds safely   Standing Ubsupported with Feet Together Able to place feet together independently and stand 1 minute safely   From Standing, Reach Forward with Outstretched Arm Can reach forward >12 cm safely (5")   From Standing Position, Pick up Object from [place] to pick up shoe safely and easily   From Standing Position, Turn to Look Behind Over each Shoulder Looks behind one side only/other side shows less weight shift   Turn 360 Degrees Able to turn 360 degrees safely one side only in 4 seconds or less   Standing Unsupported, Alternately Place  Feet on Step/Stool Able to stand independently and safely and complete 8 steps in 20 seconds   Standing Unsupported, One Foot in Front Able to take small step independently and hold 30 seconds   Standing on One Leg Tries to lift leg/unable to hold 3 seconds but remains standing independently   Total Score 48   Berg comment: 46-51 moderate fall risk (>50%)      Timed Up and Go Test   TUG Normal TUG   Normal TUG (seconds) 11.9                     [facility] Adult PT Treatment/Exercise - 12/03/16 0800      Lumbar Exercises: Aerobic   Tread Mill 1.6 mph x 8'     Lumbar Exercises: Machines for Strengthening   Cybex Knee Extension 15# x15   Cybex Knee Flexion 20# x15   Other Lumbar Machine Exercise BATCA Low Row 15# x15                  PT Short Term Goals - 10/25/16 1321      PT SHORT TERM GOAL #1   Title Pt will be independent with lumbar HEP by 10/29/16   Status Achieved           PT Long Term Goals - 12/03/16 0804      PT LONG TERM GOAL #1   Title Pt will be independent with advanced lumbar +/- balance HEP by 12/03/16   Status Achieved     PT LONG TERM GOAL #2   Title B LE strength to >/= 4=/5 for improved function by 12/03/16   Status Achieved     PT LONG TERM GOAL #3   Title Pt will demonstrate improvement on the Berg Balance Scale to 49/56 or greater to reduce risk for falls by 12/03/16   Status Not Met  Berg = 48/56     PT LONG TERM GOAL #4   Title Pt will improve gait speed to 2.5 ft/sec or greater for improved safety with community ambulation by 12/03/16   Status Achieved               Plan - 12/03/16 0804    Clinical Impression Statement Pt has demonstrated excellent progress with PT with pt denying any recent back pain and demonstrating significant improvement in B LE strength. Standardized testing reveals significant improvement in gait speed (2.84 ft/sec), TUG time (11.9 sec) and Berg balance test (48/56); all indicated a decreased  risk for falls. All  goals met for this episode except score on Berg Balance test 1 pt less than goal (goal 49/56). Pt and dtr feel pt ready to transition to HEP at this time and are comfortable with all HEP and gym activities recommended., therefore will proceed with discharge from PT.   Rehab Potential Good   Clinical Impairments Affecting Rehab Potential Advanced age, HTN, COPD, CKD, LE neuropathy   PT Treatment/Interventions Patient/family education;Neuromuscular re-education;Therapeutic exercise;Manual techniques;Therapeutic activities;Functional mobility training;Balance training;Gait training;Moist Heat;Cryotherapy;Electrical Stimulation;ADLs/Self Care Home Management   PT Next Visit Plan Discharge   Consulted and Agree with Plan of Care Patient;Family member/caregiver   Family Member Consulted Dtr - [name]      Patient will benefit from skilled therapeutic intervention in order to improve the following deficits and impairments:  Pain, Postural dysfunction, Decreased strength, Decreased activity tolerance, Difficulty walking, Decreased balance, Abnormal gait, Decreased coordination, Decreased endurance  Visit Diagnosis: Chronic left-sided low back pain with left-sided sciatica  Unsteadiness on feet  Unspecified lack of coordination  Difficulty in walking, not elsewhere classified  Other abnormalities of gait and mobility  Muscle weakness (generalized)       G-Codes - 12/26/16 0843    Functional Assessment Tool Used Lumbar Spine FOTO = 57% (43% limitation), Berg = 48/56 (14.3% impaired), & clinical judgement    Functional Limitation Mobility: Walking and moving around   Mobility: Walking and Moving Around Goal Status [phone]) At least 20 percent but less than 40 percent impaired, limited or restricted   Mobility: Walking and Moving Around Discharge Status [phone]) At least 20 percent but less than 40 percent impaired, limited or restricted      Problem List Patient Active  Problem List   Diagnosis Date Noted  . Follow-up --- PCP NOTES 09/02/2015  . Neuropathy ([place]) 06/10/2015  . Pedal edema 01/30/2015  . Hip pain, chronic 01/30/2015  . Back pain 01/30/2015  . Cramps of lower extremity 01/30/2015  . Aftercare following surgery of the circulatory system, [place] 08/05/2014  . Annual physical exam 10/19/2011  . DJD (degenerative joint disease) 12/14/2010  . Anemia 06/06/2009  . COPD (chronic obstructive pulmonary disease) ([place]) 06/06/2009  . RENAL CELL CANCER 12/25/2008  . HTN (hypertension) 12/25/2008  . AAA (abdominal aortic aneurysm) without rupture ([place]) 12/25/2008  . CKD (chronic kidney disease) 12/25/2008  . HYPERCHOLESTEROLEMIA 03/01/2008  . GERD 03/01/2008  . ACNE ROSACEA 11/29/2007  . DIVERTICULOSIS, COLON 04/12/2007    Rockney Ghee, MPT December 26, 2016, 8:56 AM  [facility] [address]  [place], [place], [address] Phone: [phone]   Fax:  [phone]  Name: [name] MRN: [id number] Date of Birth: 1927-09-26   PHYSICAL THERAPY DISCHARGE SUMMARY  Visits from Start of Care: 16  Current functional level related to goals / functional outcomes:   Refer to above clinical impression   Remaining deficits:   As above. Pt to continue with HEP and gym activities on alternate days.   Education / Equipment:    HEP, use of gym equipment  Plan: Patient agrees to discharge.  Patient goals were partially met. Patient is being discharged due to being pleased with the current functional level.  ?????    Percival Spanish, PT, MPT 12-26-16, 8:58 AM  [facility] [place], [place], [address] Phone: [phone]   Fax:  [phone]

## 2016-12-06 DIAGNOSIS — N390 Urinary tract infection, site not specified: Secondary | ICD-10-CM | POA: Diagnosis not present

## 2016-12-06 DIAGNOSIS — N183 Chronic kidney disease, stage 3 (moderate): Secondary | ICD-10-CM | POA: Diagnosis not present

## 2016-12-06 DIAGNOSIS — I10 Essential (primary) hypertension: Secondary | ICD-10-CM | POA: Diagnosis not present

## 2017-01-05 DIAGNOSIS — G308 Other Alzheimer's disease: Secondary | ICD-10-CM | POA: Diagnosis not present

## 2017-01-05 DIAGNOSIS — G6289 Other specified polyneuropathies: Secondary | ICD-10-CM | POA: Diagnosis not present

## 2017-01-05 DIAGNOSIS — G309 Alzheimer's disease, unspecified: Secondary | ICD-10-CM | POA: Diagnosis not present

## 2017-01-05 DIAGNOSIS — Z888 Allergy status to other drugs, medicaments and biological substances status: Secondary | ICD-10-CM | POA: Diagnosis not present

## 2017-01-05 DIAGNOSIS — J449 Chronic obstructive pulmonary disease, unspecified: Secondary | ICD-10-CM | POA: Diagnosis not present

## 2017-01-05 DIAGNOSIS — I129 Hypertensive chronic kidney disease with stage 1 through stage 4 chronic kidney disease, or unspecified chronic kidney disease: Secondary | ICD-10-CM | POA: Diagnosis not present

## 2017-01-05 DIAGNOSIS — G609 Hereditary and idiopathic neuropathy, unspecified: Secondary | ICD-10-CM | POA: Diagnosis not present

## 2017-01-05 DIAGNOSIS — F0280 Dementia in other diseases classified elsewhere without behavioral disturbance: Secondary | ICD-10-CM | POA: Diagnosis not present

## 2017-01-05 DIAGNOSIS — Z79899 Other long term (current) drug therapy: Secondary | ICD-10-CM | POA: Diagnosis not present

## 2017-01-05 DIAGNOSIS — R252 Cramp and spasm: Secondary | ICD-10-CM | POA: Diagnosis not present

## 2017-01-05 DIAGNOSIS — Z87891 Personal history of nicotine dependence: Secondary | ICD-10-CM | POA: Diagnosis not present

## 2017-01-05 DIAGNOSIS — G20 Parkinson's disease: Secondary | ICD-10-CM | POA: Diagnosis not present

## 2017-01-05 DIAGNOSIS — N189 Chronic kidney disease, unspecified: Secondary | ICD-10-CM | POA: Diagnosis not present

## 2017-01-06 DIAGNOSIS — L309 Dermatitis, unspecified: Secondary | ICD-10-CM | POA: Diagnosis not present

## 2017-01-24 DIAGNOSIS — G20 Parkinson's disease: Secondary | ICD-10-CM | POA: Diagnosis not present

## 2017-01-24 DIAGNOSIS — D61818 Other pancytopenia: Secondary | ICD-10-CM | POA: Diagnosis not present

## 2017-01-24 DIAGNOSIS — D72819 Decreased white blood cell count, unspecified: Secondary | ICD-10-CM | POA: Diagnosis not present

## 2017-01-24 DIAGNOSIS — D696 Thrombocytopenia, unspecified: Secondary | ICD-10-CM | POA: Diagnosis not present

## 2017-01-24 DIAGNOSIS — G629 Polyneuropathy, unspecified: Secondary | ICD-10-CM | POA: Diagnosis not present

## 2017-01-24 DIAGNOSIS — Z6824 Body mass index (BMI) 24.0-24.9, adult: Secondary | ICD-10-CM | POA: Diagnosis not present

## 2017-01-24 DIAGNOSIS — N289 Disorder of kidney and ureter, unspecified: Secondary | ICD-10-CM | POA: Diagnosis not present

## 2017-01-24 DIAGNOSIS — D631 Anemia in chronic kidney disease: Secondary | ICD-10-CM | POA: Diagnosis not present

## 2017-01-24 DIAGNOSIS — Z79899 Other long term (current) drug therapy: Secondary | ICD-10-CM | POA: Diagnosis not present

## 2017-01-24 DIAGNOSIS — Z87891 Personal history of nicotine dependence: Secondary | ICD-10-CM | POA: Diagnosis not present

## 2017-01-26 DIAGNOSIS — Z9889 Other specified postprocedural states: Secondary | ICD-10-CM | POA: Diagnosis not present

## 2017-01-26 DIAGNOSIS — D649 Anemia, unspecified: Secondary | ICD-10-CM | POA: Diagnosis not present

## 2017-01-26 DIAGNOSIS — N189 Chronic kidney disease, unspecified: Secondary | ICD-10-CM | POA: Diagnosis not present

## 2017-01-26 DIAGNOSIS — D61818 Other pancytopenia: Secondary | ICD-10-CM | POA: Diagnosis not present

## 2017-02-02 DIAGNOSIS — N183 Chronic kidney disease, stage 3 (moderate): Secondary | ICD-10-CM | POA: Diagnosis not present

## 2017-02-02 DIAGNOSIS — I10 Essential (primary) hypertension: Secondary | ICD-10-CM | POA: Diagnosis not present

## 2017-02-03 ENCOUNTER — Encounter: Payer: Self-pay | Admitting: Gastroenterology

## 2017-02-08 DIAGNOSIS — I10 Essential (primary) hypertension: Secondary | ICD-10-CM | POA: Diagnosis not present

## 2017-02-08 DIAGNOSIS — N390 Urinary tract infection, site not specified: Secondary | ICD-10-CM | POA: Diagnosis not present

## 2017-02-15 DIAGNOSIS — N138 Other obstructive and reflux uropathy: Secondary | ICD-10-CM | POA: Diagnosis not present

## 2017-02-15 DIAGNOSIS — R3129 Other microscopic hematuria: Secondary | ICD-10-CM | POA: Diagnosis not present

## 2017-02-15 DIAGNOSIS — N401 Enlarged prostate with lower urinary tract symptoms: Secondary | ICD-10-CM | POA: Diagnosis not present

## 2017-02-15 DIAGNOSIS — R358 Other polyuria: Secondary | ICD-10-CM | POA: Diagnosis not present

## 2017-02-16 ENCOUNTER — Ambulatory Visit: Payer: Medicare Other | Attending: Neurology | Admitting: Occupational Therapy

## 2017-02-16 DIAGNOSIS — R278 Other lack of coordination: Secondary | ICD-10-CM

## 2017-02-16 DIAGNOSIS — M25612 Stiffness of left shoulder, not elsewhere classified: Secondary | ICD-10-CM | POA: Diagnosis not present

## 2017-02-16 DIAGNOSIS — R29818 Other symptoms and signs involving the nervous system: Secondary | ICD-10-CM | POA: Insufficient documentation

## 2017-02-16 DIAGNOSIS — M25611 Stiffness of right shoulder, not elsewhere classified: Secondary | ICD-10-CM | POA: Insufficient documentation

## 2017-02-16 DIAGNOSIS — R251 Tremor, unspecified: Secondary | ICD-10-CM | POA: Insufficient documentation

## 2017-02-16 DIAGNOSIS — R293 Abnormal posture: Secondary | ICD-10-CM | POA: Insufficient documentation

## 2017-02-16 DIAGNOSIS — R29898 Other symptoms and signs involving the musculoskeletal system: Secondary | ICD-10-CM | POA: Diagnosis not present

## 2017-02-16 DIAGNOSIS — R41840 Attention and concentration deficit: Secondary | ICD-10-CM | POA: Insufficient documentation

## 2017-02-16 NOTE — Therapy (Signed)
[facility] [address]  [place], [place], [address] Phone: [phone]   Fax:  [phone]  Occupational Therapy Evaluation  Patient Details  Name: [name] MRN: [id number] Date of Birth: October 14, 1927 Referring Provider: Dr. Jairo Ben  Encounter Date: 02/16/2017      OT End of Session - 02/16/17 1419    Visit Number 1   Number of Visits 17   Date for OT Re-Evaluation 04/16/17   Authorization Type MCR - G code needed   Authorization - Visit Number 1   Authorization - Number of Visits 10   OT Start Time 0800   OT Stop Time 0845   OT Time Calculation (min) 45 min   Activity Tolerance Patient tolerated treatment well      Past Medical History:  Diagnosis Date  . Abdominal aortic aneurysm ([place]) 09/2008   s/o endovascular repair and angiogram 10/04/08   . Anemia    EGD Cscope 08-2012  . Bleeding ulcer 1997  . CKD (chronic kidney disease), stage IV (HCC)    Stable, Dr. Dimas Aguas  . COPD (chronic obstructive pulmonary disease) ([place])   . H/O hyperkalemia    Stable  . Hypertension   . Leukopenia 2/11   thought to be from doxy  . Renal cell carcinoma    clear cell type dx 10/09, s/p excision 10-24-08  . Renal insufficiency   . Solitary left kidney    W/ chronic tubule-interstitial damage, Dr. Dimas Aguas  . Spinal stenosis    causing LE weakness-persistant, being worked up by Hydrographic surveyor and neurologist    Past Surgical History:  Procedure Laterality Date  . ABDOMINAL AORTIC ANEURYSM REPAIR  2009   EVAR  . [place]  . [place] (lip), 2004  . NEPHRECTOMY  2009   Open nephrectomy for cancer    There were no vitals filed for this visit.      Subjective Assessment - 02/16/17 0803    Subjective  Legs feel heavy   Pertinent History neuropathy   Patient Stated Goals Get faster with things   Currently in Pain? No/denies           [facility] OT Assessment -  02/16/17 0001      Assessment   Diagnosis Secondary Parkinsonism   Referring Provider Dr. Jairo Ben   Onset Date --  Diagnosed about a year ago   Assessment Pt presents with bradykineisa, kyphotic posture, decreased cognition. Pt not currently on senimet, but taking aricept for memory deficits.   Prior Therapy no prior O.T.      Precautions   Precautions Fall     Balance Screen   Has the patient fallen in the past 6 months No   Has the patient had a decrease in activity level because of a fear of falling?  Yes   Is the patient reluctant to leave their home because of a fear of falling?  No     Home  Environment   Writer;Door   Additional Comments Daughter lives close by and checks on him daily; she provides dinners, helps with heavy cleaning and medication management. Pt lives in one level home with level entry. Pt has grab bars in shower. DME: shower seat, walker, cane, and BSC (but does not use)   Lives With Alone     Prior Function   Level of Independence Independent with basic ADLs  and light cleaning  Vocation Retired     ADL   Eating/Feeding Modified independent  increased time, difficulty cutting food   Grooming --  increased time   Upper Body Bathing --  sponge bathing   Lower Body Bathing --  sponge bathing   Upper Body Dressing Increased time  difficulty with jacket    Lower Body Dressing Increased time   Toilet Tranfer Modified independent  elevated   Toileting - Clothing Manipulation Modified independent;Increased time   Toileting -  Hygiene Modified Independent   Tub/Shower Transfer --  not using shower but 1x/wk, sponge bathing mostly   Equipment Used --  grab bars in shower, does not like shower seat   ADL comments recommended using shower seat for washing/rinsing hair and bathing lower legs and feet     IADL   Shopping Completely unable to shop  daughter does shopping   Light Housekeeping Does personal laundry  completely;Performs light daily tasks such as dishwashing, bed making  daughter comes to do heavy cleaning, vacuuming   Meal Prep Able to complete simple cold meal and snack prep  & microwave, lunches delivered, dtr provides dinner   Medication Management Takes responsibility if medication is prepared in advance in seperate dosage  daughter places in pill boxes     Mobility   Mobility Status --  pt reports some freezing   Mobility Status Comments does not use device     Written Expression   Dominant Hand Right   Handwriting 50% legible  only writes name d/t education level      Vision - History   Baseline Vision --  doesn't use reading glasses   Additional Comments Reports eyes get itchy, feels like something is in his eye     Cognition   Overall Cognitive Status Impaired/Different from baseline  also - education level only to 8th grade     Observation/Other Assessments   Observations Decreased processing speed, decreased STM. Reports resting tremors. Reports "cramping" in both shoulders with activity. No apparent rigidity of elbows and wrists. Kyphotic posture   Other Surveys  Select   Physical Performance Test   Yes   Simulated Eating Time (seconds) 19.44 sec   Donning Doffing Jacket Time (seconds) 52.75 sec   Donning Doffing Jacket Comments Button/unbutton test: 1 min. 22.71 sec     Sensation   Additional Comments occasional numbness with certain positions     Coordination   9 Hole Peg Test Right;Left   Right 9 Hole Peg Test 44.50 sec   Left 9 Hole Peg Test 47.78 sec   Box and Blocks Rt = 33, Lt = 30 (not clearing partition on Lt)    Tremors Resting     Tone   Assessment Location --  none in UE's     ROM / Strength   AROM / PROM / Strength AROM     AROM   Overall AROM Comments Bilateral shoulder flex b/t 100-110*. ER approx 50%, IR approx 75%. Bilateral limited wrist extension                           OT Short Term Goals - 02/16/17 1425       OT SHORT TERM GOAL #1   Title Independent with PD specific HEP (check STG's by 03/16/17)    Time 4   Period Weeks   Status New     OT SHORT TERM GOAL #2   Title Pt to button/unbutton 3 buttons in  less than 60 sec.    Baseline eval = 1 min. 22 sec   Time 4   Period Weeks   Status New     OT SHORT TERM GOAL #3   Title Pt will perform PPT #4 in 40 sec. or less for greater ease with UB dressing   Baseline eval = 52.75 sec   Time 4   Period Weeks   Status New     OT SHORT TERM GOAL #4   Title Pt will improve bilateral UE functional use by increasing blocks by 6 or greater on Box & Blocks test   Baseline eval: Rt = 33, Lt = 30   Time 4   Period Weeks   Status New     OT SHORT TERM GOAL #5   Title Pt to demo overhead reaching bilaterally to 115* or greater shoulder flexion   Baseline eval: approx 100-110*   Time 4   Period Weeks   Status New     Additional Short Term Goals   Additional Short Term Goals Yes     OT SHORT TERM GOAL #6   Title Pt/family will verbalize understanding of ways to prevent future PD complications and appropriate community resources prn   Time 4   Period Weeks   Status New           OT Long Term Goals - 02/16/17 1430      OT LONG TERM GOAL #1   Title Pt will verbalize understanding of adaptive strategies to incr. ease with ADLS/IADLS (Check LTG's by [id number])    Time 8   Period Weeks   Status New     OT LONG TERM GOAL #2   Title Pt/family will verbalize understanding of ways to keep thinking skills sharp and ways to compensate for short term memory changes   Time 8   Period Weeks   Status New     OT LONG TERM GOAL #3   Title Pt will button/unbutton 3 buttons in 50 sec. or less   Baseline eval: 1 min. 22 sec   Time 8   Period Weeks   Status New     OT LONG TERM GOAL #4   Title Pt will improve coordination as evidenced by increasing speed on 9 hole peg test by 8 sec. or greater bilaterally   Baseline eval: Rt = 44.50 sec, Lt =  47.78 sec   Time 8   Period Weeks   Status New     OT LONG TERM GOAL #5   Title Pt will improve PPT #2 by 5 sec. or more for greater ease with feeding   Baseline eval: 19.44 sec.    Time 8   Period Weeks   Status New               Plan - 02/16/17 1420    Clinical Impression Statement Pt is a 82 y.o. male who presents to outpatient O.T. with Parkinsonism. Pt presents with bradykinesia, abnormal posture, decr. coordination, decr. ROM/stiffness, and decr. cognition. Pt would benefit from O.T. to address these deficits in order to prevent future PD complications, establish PD specific HEP, and incr. ease with ADLS for improved quality of life   Clinical Impairments Affecting Rehab Potential cognitive deficits   OT Frequency 2x / week   OT Duration 8 weeks  plus eval   Plan Re-issue and review PWR! Seated, and issue PWR! Hands (following visit: coordination HEP and POP info)    Consulted and  Agree with Plan of Care Patient;Family member/caregiver   Family Member Consulted daughter      Patient will benefit from skilled therapeutic intervention in order to improve the following deficits and impairments:  Decreased coordination, Decreased range of motion, Difficulty walking, Impaired flexibility, Improper body mechanics, Improper spinal/pelvic alignment, Decreased safety awareness, Decreased knowledge of precautions, Decreased knowledge of use of DME, Impaired UE functional use, Pain, Decreased cognition, Decreased mobility, Decreased strength, Impaired vision/preception  Visit Diagnosis: Other symptoms and signs involving the nervous system - Plan: Ot plan of care cert/re-cert  Other symptoms and signs involving the musculoskeletal system - Plan: Ot plan of care cert/re-cert  Other lack of coordination - Plan: Ot plan of care cert/re-cert  Abnormal posture - Plan: Ot plan of care cert/re-cert  Stiffness of right shoulder, not elsewhere classified - Plan: Ot plan of care  cert/re-cert  Stiffness of left shoulder, not elsewhere classified - Plan: Ot plan of care cert/re-cert  Tremor - Plan: Ot plan of care cert/re-cert  Attention and concentration deficit - Plan: Ot plan of care cert/re-cert      G-Codes - [id number]    Functional Assessment Tool Used (Outpatient only) Button/unbutton test: 1 min. 22 sec., PPT #4 = 52.75 sec., Box & Blocks: RT = 33, Lt = 30   Functional Limitation Carrying, moving and handling objects   Carrying, Moving and Handling Objects Current Status [id number]) At least 40 percent but less than 60 percent impaired, limited or restricted   Carrying, Moving and Handling Objects Goal Status [id number]) At least 1 percent but less than 20 percent impaired, limited or restricted      Problem List Patient Active Problem List   Diagnosis Date Noted  . Follow-up --- PCP NOTES 09/02/2015  . Neuropathy ([place]) 06/10/2015  . Pedal edema 01/30/2015  . Hip pain, chronic 01/30/2015  . Back pain 01/30/2015  . Cramps of lower extremity 01/30/2015  . Aftercare following surgery of the circulatory system, [place] 08/05/2014  . Annual physical exam 10/19/2011  . DJD (degenerative joint disease) 12/14/2010  . Anemia 06/06/2009  . COPD (chronic obstructive pulmonary disease) ([place]) 06/06/2009  . RENAL CELL CANCER 12/25/2008  . HTN (hypertension) 12/25/2008  . AAA (abdominal aortic aneurysm) without rupture ([place]) 12/25/2008  . CKD (chronic kidney disease) 12/25/2008  . HYPERCHOLESTEROLEMIA 03/01/2008  . GERD 03/01/2008  . ACNE ROSACEA 11/29/2007  . DIVERTICULOSIS, COLON 04/12/2007    Carey Bullocks, OTR/L 02/16/2017, 2:39 PM  [facility] [address]  [place], [place], [address] Phone: [phone]   Fax:  [phone]  Name: [name] MRN: [id number] Date of Birth: 1927/12/21

## 2017-02-17 ENCOUNTER — Telehealth: Payer: Self-pay | Admitting: Internal Medicine

## 2017-02-17 NOTE — Telephone Encounter (Signed)
10/21/15 PR PPPS, SUBSEQ VISIT [id number] LVM advising patient to scheduled AWV

## 2017-02-21 DIAGNOSIS — N359 Urethral stricture, unspecified: Secondary | ICD-10-CM | POA: Diagnosis not present

## 2017-02-21 DIAGNOSIS — R3129 Other microscopic hematuria: Secondary | ICD-10-CM | POA: Diagnosis not present

## 2017-02-21 DIAGNOSIS — N289 Disorder of kidney and ureter, unspecified: Secondary | ICD-10-CM | POA: Diagnosis not present

## 2017-02-22 DIAGNOSIS — R339 Retention of urine, unspecified: Secondary | ICD-10-CM | POA: Diagnosis not present

## 2017-02-22 DIAGNOSIS — R3912 Poor urinary stream: Secondary | ICD-10-CM | POA: Diagnosis not present

## 2017-02-22 DIAGNOSIS — R319 Hematuria, unspecified: Secondary | ICD-10-CM | POA: Diagnosis not present

## 2017-02-22 DIAGNOSIS — Z859 Personal history of malignant neoplasm, unspecified: Secondary | ICD-10-CM | POA: Diagnosis not present

## 2017-02-25 ENCOUNTER — Encounter: Payer: Self-pay | Admitting: Surgery

## 2017-02-28 ENCOUNTER — Ambulatory Visit: Payer: Medicare Other | Attending: Neurology | Admitting: Occupational Therapy

## 2017-02-28 ENCOUNTER — Other Ambulatory Visit: Payer: Medicare Other

## 2017-02-28 DIAGNOSIS — M25611 Stiffness of right shoulder, not elsewhere classified: Secondary | ICD-10-CM | POA: Diagnosis not present

## 2017-02-28 DIAGNOSIS — R293 Abnormal posture: Secondary | ICD-10-CM | POA: Diagnosis not present

## 2017-02-28 DIAGNOSIS — R29818 Other symptoms and signs involving the nervous system: Secondary | ICD-10-CM | POA: Insufficient documentation

## 2017-02-28 DIAGNOSIS — M25612 Stiffness of left shoulder, not elsewhere classified: Secondary | ICD-10-CM | POA: Diagnosis not present

## 2017-02-28 DIAGNOSIS — R29898 Other symptoms and signs involving the musculoskeletal system: Secondary | ICD-10-CM | POA: Diagnosis not present

## 2017-02-28 DIAGNOSIS — R278 Other lack of coordination: Secondary | ICD-10-CM | POA: Insufficient documentation

## 2017-02-28 NOTE — Patient Instructions (Addendum)
         PWR! Hands  With arms stretched out in front of you (elbows straight), perform the following:   PWR! Rock: Move wrists up and down [employer]! Twist: Twist palms up and down BIG  Then, start with elbows bent and hands closed.   PWR! Hands: Push hands out BIG. Elbows straight, wrists up, fingers open and spread apart BIG. (Can also perform by pushing down on table, chair, knees. Push above head, out to the side, behind you, in front of you.)   ** Make each movement big and deliberate so that you feel the movement.  Perform at least 10 repetitions 1x/day, but perform PWR! hands throughout the day when you are having trouble using your hands (picking up/manipulating small objects, writing, eating, typing, sewing, buttoning, etc.).

## 2017-02-28 NOTE — Therapy (Signed)
[facility] [address]  [place], [place], [address] Phone: [phone]   Fax:  [phone]  Occupational Therapy Treatment  Patient Details  Name: [name] MRN: [id number] Date of Birth: 09/06/1927 Referring Provider: Dr. Jairo Ben  Encounter Date: 02/28/2017      OT End of Session - 02/28/17 0906    Visit Number 2   Number of Visits 17   Date for OT Re-Evaluation 04/16/17   Authorization Type MCR - G code needed   Authorization - Visit Number 2   Authorization - Number of Visits 10   OT Start Time 0800   OT Stop Time 0845   OT Time Calculation (min) 45 min   Activity Tolerance Patient limited by fatigue      Past Medical History:  Diagnosis Date  . Abdominal aortic aneurysm ([place]) 09/2008   s/o endovascular repair and angiogram 10/04/08   . Anemia    EGD Cscope 08-2012  . Bleeding ulcer 1997  . CKD (chronic kidney disease), stage IV (HCC)    Stable, Dr. Dimas Aguas  . COPD (chronic obstructive pulmonary disease) ([place])   . H/O hyperkalemia    Stable  . Hypertension   . Leukopenia 2/11   thought to be from doxy  . Renal cell carcinoma    clear cell type dx 10/09, s/p excision 10-24-08  . Renal insufficiency   . Solitary left kidney    W/ chronic tubule-interstitial damage, Dr. Dimas Aguas  . Spinal stenosis    causing LE weakness-persistant, being worked up by Hydrographic surveyor and neurologist    Past Surgical History:  Procedure Laterality Date  . ABDOMINAL AORTIC ANEURYSM REPAIR  2009   EVAR  . [place]  . [place] (lip), 2004  . NEPHRECTOMY  2009   Open nephrectomy for cancer    There were no vitals filed for this visit.      Subjective Assessment - 02/28/17 0803    Pertinent History neuropathy   Patient Stated Goals Get faster with things   Currently in Pain? No/denies                      OT Treatments/Exercises (OP) - 02/28/17  0001      Neurological Re-education Exercises   Other Exercises 1 PWR! Seated basic 4 - x 10 reps each and max verbal, demo, and tactile cueing for large amplitude movement, sequencing, following with head and eyes, fully extending elbows, and Rt wrist and finger ext. Instructed pt's daughter to do with him, and start with 5 reps each side (instead of 10 reps each side) secondary to fatigue and max difficulty. Also instructed to focus on PWR! Up and PWR! Rock initially, and then add PWR! Twist and PWR! Step as able.    Other Exercises 2 PWR! Hands: Pt/daughter instructed in PWR! [place], [place]! Twist, and PWR! Hands. Did not do PWR! Step with hands d/t cognitive deficits                OT Education - 02/28/17 0804    Education provided Yes   Education Details PWR! Seated modified, PWR! Hands modified   Person(s) Educated Patient;Child(ren)   Methods Explanation;Demonstration;Handout;Verbal cues;Tactile cues   Comprehension Verbalized understanding;Returned demonstration;Verbal cues required;Need further instruction  daughter present for education          OT Short Term Goals - 02/28/17 [phone]  OT SHORT TERM GOAL #1   Title Independent with PD specific HEP (check STG's by 03/16/17)    Time 4   Period Weeks   Status On-going     OT SHORT TERM GOAL #2   Title Pt to button/unbutton 3 buttons in less than 60 sec.    Baseline eval = 1 min. 22 sec   Time 4   Period Weeks   Status New     OT SHORT TERM GOAL #3   Title Pt will perform PPT #4 in 40 sec. or less for greater ease with UB dressing   Baseline eval = 52.75 sec   Time 4   Period Weeks   Status New     OT SHORT TERM GOAL #4   Title Pt will improve bilateral UE functional use by increasing blocks by 6 or greater on Box & Blocks test   Baseline eval: Rt = 33, Lt = 30   Time 4   Period Weeks   Status New     OT SHORT TERM GOAL #5   Title Pt to demo overhead reaching bilaterally to 115* or greater shoulder flexion    Baseline eval: approx 100-110*   Time 4   Period Weeks   Status New     OT SHORT TERM GOAL #6   Title Pt/family will verbalize understanding of ways to prevent future PD complications and appropriate community resources prn   Time 4   Period Weeks   Status New           OT Long Term Goals - 02/16/17 1430      OT LONG TERM GOAL #1   Title Pt will verbalize understanding of adaptive strategies to incr. ease with ADLS/IADLS (Check LTG's by [id number])    Time 8   Period Weeks   Status New     OT LONG TERM GOAL #2   Title Pt/family will verbalize understanding of ways to keep thinking skills sharp and ways to compensate for short term memory changes   Time 8   Period Weeks   Status New     OT LONG TERM GOAL #3   Title Pt will button/unbutton 3 buttons in 50 sec. or less   Baseline eval: 1 min. 22 sec   Time 8   Period Weeks   Status New     OT LONG TERM GOAL #4   Title Pt will improve coordination as evidenced by increasing speed on 9 hole peg test by 8 sec. or greater bilaterally   Baseline eval: Rt = 44.50 sec, Lt = 47.78 sec   Time 8   Period Weeks   Status New     OT LONG TERM GOAL #5   Title Pt will improve PPT #2 by 5 sec. or more for greater ease with feeding   Baseline eval: 19.44 sec.    Time 8   Period Weeks   Status New               Plan - 02/28/17 0907    Rehab Potential Fair   Clinical Impairments Affecting Rehab Potential cognitive deficits   OT Frequency 2x / week   OT Duration 8 weeks   OT Treatment/Interventions Self-care/ADL training;DME and/or AE instruction;Patient/family education;Moist Heat;Therapeutic exercises;Therapeutic activities;Neuromuscular education;Functional Mobility Training;Passive range of motion;Cognitive remediation/compensation;Manual Therapy;Visual/perceptual remediation/compensation;Splinting   Plan coordination HEP, POP info (following session: review modified PWR! Seated and PWR! Hands, ADL strategies)    OT Home  Exercise Plan 02/28/17: PWR!  Seated modified and PWR! Hands modified.    Consulted and Agree with Plan of Care Patient;Family member/caregiver   Family Member Consulted daughter      Patient will benefit from skilled therapeutic intervention in order to improve the following deficits and impairments:  Decreased coordination, Decreased range of motion, Difficulty walking, Impaired flexibility, Improper body mechanics, Improper spinal/pelvic alignment, Decreased safety awareness, Decreased knowledge of precautions, Decreased knowledge of use of DME, Impaired UE functional use, Pain, Decreased cognition, Decreased mobility, Decreased strength, Impaired vision/preception  Visit Diagnosis: Other symptoms and signs involving the nervous system  Other symptoms and signs involving the musculoskeletal system  Abnormal posture  Stiffness of right shoulder, not elsewhere classified  Stiffness of left shoulder, not elsewhere classified    Problem List Patient Active Problem List   Diagnosis Date Noted  . Follow-up --- PCP NOTES 09/02/2015  . Neuropathy ([place]) 06/10/2015  . Pedal edema 01/30/2015  . Hip pain, chronic 01/30/2015  . Back pain 01/30/2015  . Cramps of lower extremity 01/30/2015  . Aftercare following surgery of the circulatory system, [place] 08/05/2014  . Annual physical exam 10/19/2011  . DJD (degenerative joint disease) 12/14/2010  . Anemia 06/06/2009  . COPD (chronic obstructive pulmonary disease) ([place]) 06/06/2009  . RENAL CELL CANCER 12/25/2008  . HTN (hypertension) 12/25/2008  . AAA (abdominal aortic aneurysm) without rupture ([place]) 12/25/2008  . CKD (chronic kidney disease) 12/25/2008  . HYPERCHOLESTEROLEMIA 03/01/2008  . GERD 03/01/2008  . ACNE ROSACEA 11/29/2007  . DIVERTICULOSIS, COLON 04/12/2007    Carey Bullocks, OTR/L 02/28/2017, 9:14 AM  [facility] [address]  [place], [place],  [address] Phone: [phone]   Fax:  [phone]  Name: [name] MRN: [id number] Date of Birth: 15-Jul-1927

## 2017-03-07 ENCOUNTER — Ambulatory Visit: Payer: Medicare Other | Admitting: Occupational Therapy

## 2017-03-07 ENCOUNTER — Ambulatory Visit (INDEPENDENT_AMBULATORY_CARE_PROVIDER_SITE_OTHER): Payer: Medicare Other | Admitting: Vascular Surgery

## 2017-03-07 VITALS — BP 186/72 | HR 59 | Temp 97.1°F | Resp 20 | Ht 68.0 in | Wt 168.4 lb

## 2017-03-07 DIAGNOSIS — I714 Abdominal aortic aneurysm, without rupture, unspecified: Secondary | ICD-10-CM

## 2017-03-07 NOTE — Progress Notes (Signed)
[facility] [place]  Patient name: [name] MRN: [id number] DOB: [birth year] Sex: male  REASON FOR VISIT: follow-up EVAR  HPI: [name] is a 83 y.o. male who presents for continued follow-up s/p endovascular repair of his AAA on 10/04/2008 by Dr. Trula Slade. Prior to his procedure, he underwent right nephrectomy due to a renal cell tumor. He CKD with a creatinine of 2.2 back in October 2017. His aneurysm sac back on 10/14/2008 measured 5.8 cm. He has been followed with non contrast CT scans due to kidney function and difficulty obtaining good ultrasound images.   The patient denies any abdominal or back pain.   Past Medical History:  Diagnosis Date  . Abdominal aortic aneurysm ([place]) 09/2008   s/o endovascular repair and angiogram 10/04/08   . Anemia    EGD Cscope 08-2012  . Bleeding ulcer 1997  . CKD (chronic kidney disease), stage IV (HCC)    Stable, Dr. Dimas Aguas  . COPD (chronic obstructive pulmonary disease) ([place])   . H/O hyperkalemia    Stable  . Hypertension   . Leukopenia 2/11   thought to be from doxy  . Renal cell carcinoma    clear cell type dx 10/09, s/p excision 10-24-08  . Renal insufficiency   . Solitary left kidney    W/ chronic tubule-interstitial damage, Dr. Dimas Aguas  . Spinal stenosis    causing LE weakness-persistant, being worked up by Hydrographic surveyor and neurologist    Family History  Problem Relation Age of Onset  . Diabetes Father     ?  . Stroke Father   . Hypertension Father   . Colon cancer Neg Hx   . Prostate cancer Neg Hx     SOCIAL HISTORY: Social History  Substance Use Topics  . Smoking status: Former Smoker    Quit date: 12/28/2007  . Smokeless tobacco: Never Used     Comment: used to smoke 1.5 ppd  . Alcohol use No    Allergies  Allergen Reactions  . Baclofen Other (See Comments)    Unable to walk per Pt's family   . Iodinated Diagnostic Agents     Only one kidney   . Ioxaglate     Only one  kidney     Current Outpatient Prescriptions  Medication Sig Dispense Refill  . albuterol (PROVENTIL HFA;VENTOLIN HFA) 108 (90 BASE) MCG/ACT inhaler Inhale 2 puffs into the lungs every 6 (six) hours as needed for wheezing or shortness of breath. 1 Inhaler 0  . Casanthranol-Docusate Sodium 30-100 MG CAPS Take by mouth.      . Cholecalciferol (VITAMIN D) 2000 units CAPS Take 2,000 Units by mouth daily.    Marland Kitchen donepezil (ARICEPT) 5 MG tablet Take 5 mg by mouth at bedtime.    . FeFum-FePo-FA-B Cmp-C-Zn-Mn-Cu (SE-TAN PLUS) 162-115.2-1 MG CAPS Take 1 tablet by mouth daily. Take 1 Tab  daily    . hydrALAZINE (APRESOLINE) 25 MG tablet Take 1 tablet (25 mg total) by mouth 2 (two) times daily. 180 tablet 2  . hydrochlorothiazide (HYDRODIURIL) 25 MG tablet Take 25 mg by mouth daily.    Marland Kitchen labetalol (NORMODYNE) 100 MG tablet Take 1 tablet (100 mg total) by mouth 2 (two) times daily. 60 tablet 6  . pregabalin (LYRICA) 50 MG capsule Take 50 mg by mouth 3 (three) times daily.    . sodium polystyrene (KAYEXALATE) 15 GM/60ML suspension Take 15 g by mouth 2 (two) times a week.      No  current facility-administered medications for this visit.     REVIEW OF SYSTEMS:  [X]  denotes positive finding, [ ]  denotes negative finding Cardiac  Comments:  Chest pain or chest pressure:    Shortness of breath upon exertion:    Short of breath when lying flat:    Irregular heart rhythm:        Vascular    Pain in calf, thigh, or hip brought on by ambulation:    Pain in feet at night that wakes you up from your sleep:     Blood clot in your veins:    Leg swelling:         Pulmonary    Oxygen at home:    Productive cough:     Wheezing:         Neurologic    Sudden weakness in arms or legs:     Sudden numbness in arms or legs:     Sudden onset of difficulty speaking or slurred speech:    Temporary loss of vision in one eye:     Problems with dizziness:         Gastrointestinal    Blood in stool:     Vomited  blood:         Genitourinary    Burning when urinating:     Blood in urine:        Psychiatric    Major depression:         Hematologic    Bleeding problems:    Problems with blood clotting too easily:        Skin    Rashes or ulcers:        Constitutional    Fever or chills:      PHYSICAL EXAM: Vitals:   03/07/17 1343 03/07/17 1346  BP: (!) 181/77 (!) 186/72  Pulse: (!) 59   Resp: 20   Temp: 97.1 F (36.2 C)   TempSrc: Oral   SpO2: 96%   Weight: 168 lb 6.4 oz (76.4 kg)   Height: 5\' 8"  (1.727 m)     GENERAL: The patient is a well-nourished male, in no acute distress. The vital signs are documented above. CARDIAC: There is a regular rate and rhythm. No carotid bruits. VASCULAR: Palpable radial and pedal pulses bilaterally.  PULMONARY: Non labored respiratory effort. Clear to auscultation bilaterally.  ABDOMEN: Abdomen soft and non tender. Palpable aorta. Non pulsatile.  MUSCULOSKELETAL: Kyphosis.  NEUROLOGIC: No focal weakness or paresthesias are detected. SKIN: There are no ulcers or rashes noted. PSYCHIATRIC: The patient has a normal affect.  DATA:  CT abd/pelvis from 03/07/17 (disc) reviewed with Dr. Trula Slade. Maximal diameter of aorta is 6.7 cm, up from 6.3 six months ago. Report also shows cystic lesion of left kidney.   MEDICAL ISSUES: Abdominal aortic aneurysm  There has been a small enlargement of the patient's native aneurysm sac. Discussed case with Dr. Trula Slade who recommends continued observation with non contrast CT in 6 months. No intervention planned as any intervention may cause the patient to go into renal failure. He does have a cystic lesion on the left kidney on today's CT scan. He will be seeing nephrology about this soon.   Hypertension  Systolic 962I in the office today. Usually it is in the 140s-150s at home. Discussed need for follow-up with PCP for titration of blood pressure medications if he runs above 150s at home.   Virgina Jock,  PA-C [facility] MD: Trula Slade

## 2017-03-09 ENCOUNTER — Ambulatory Visit: Payer: Medicare Other | Admitting: Occupational Therapy

## 2017-03-09 DIAGNOSIS — R29898 Other symptoms and signs involving the musculoskeletal system: Secondary | ICD-10-CM

## 2017-03-09 DIAGNOSIS — R278 Other lack of coordination: Secondary | ICD-10-CM | POA: Diagnosis not present

## 2017-03-09 DIAGNOSIS — R29818 Other symptoms and signs involving the nervous system: Secondary | ICD-10-CM

## 2017-03-09 DIAGNOSIS — M25611 Stiffness of right shoulder, not elsewhere classified: Secondary | ICD-10-CM | POA: Diagnosis not present

## 2017-03-09 DIAGNOSIS — R293 Abnormal posture: Secondary | ICD-10-CM

## 2017-03-09 DIAGNOSIS — M25612 Stiffness of left shoulder, not elsewhere classified: Secondary | ICD-10-CM | POA: Diagnosis not present

## 2017-03-09 NOTE — Patient Instructions (Addendum)
Coordination Exercises  Perform the following exercises for 15 minutes 1 times per day. Perform with both hand(s). Perform using big movements.   Flipping Cards: Place deck of cards on the table. Flip cards over by opening your hand big to grasp and then turn your palm up big.   Take one card at a time, try to flick card across table by pushing in one big push with fingers ("finger flicks")    Rotate ball with fingertips: Pick up with fingers/thumb and move as much as you can with each turn/movement (clockwise and counter-clockwise).   Toss ball in the air and catch with the same hand: Toss big/high.   Pick up coins and place in container: Pick up with big, intentional movements. Do not drag coin to the edge.   Pick up 5 coins one at a time and hold in palm. Then, move coins from palm to fingertips one at a time to stack.   Perform "Flicks"/hand stretches (PWR! Hands): Close hands with elbows bent, then flick out your fingers with focus on opening hands, pulling wrists back, and straightening elbows like you are pushing.   Fasten nuts/bolts or put on bottle caps: Turn as much/as big as you can with each turn.

## 2017-03-09 NOTE — Therapy (Signed)
[facility] [address]  [place], [place], [address] Phone: [phone]   Fax:  [phone]  Occupational Therapy Treatment  Patient Details  Name: [name] MRN: [id number] Date of Birth: 1927-01-19 Referring Provider: Dr. Jairo Ben  Encounter Date: 03/09/2017      OT End of Session - 03/09/17 1039    Visit Number 3   Number of Visits 17   Date for OT Re-Evaluation 04/16/17   Authorization Type MCR - G code needed   Authorization - Visit Number 3   Authorization - Number of Visits 10   OT Start Time 0800   OT Stop Time 0845   OT Time Calculation (min) 45 min   Activity Tolerance Patient tolerated treatment well      Past Medical History:  Diagnosis Date  . Abdominal aortic aneurysm ([place]) 09/2008   s/o endovascular repair and angiogram 10/04/08   . Anemia    EGD Cscope 08-2012  . Bleeding ulcer 1997  . CKD (chronic kidney disease), stage IV (HCC)    Stable, Dr. Dimas Aguas  . COPD (chronic obstructive pulmonary disease) ([place])   . H/O hyperkalemia    Stable  . Hypertension   . Leukopenia 2/11   thought to be from doxy  . Renal cell carcinoma    clear cell type dx 10/09, s/p excision 10-24-08  . Renal insufficiency   . Solitary left kidney    W/ chronic tubule-interstitial damage, Dr. Dimas Aguas  . Spinal stenosis    causing LE weakness-persistant, being worked up by Hydrographic surveyor and neurologist    Past Surgical History:  Procedure Laterality Date  . ABDOMINAL AORTIC ANEURYSM REPAIR  2009   EVAR  . [place]  . [place] ([place]), 2004  . NEPHRECTOMY  2009   Open nephrectomy for cancer    There were no vitals filed for this visit.      Subjective Assessment - 03/09/17 0841    Subjective  I get tired easily (with the exercises)   Pertinent History neuropathy   Patient Stated Goals Get faster with things   Currently in Pain? No/denies                       OT Treatments/Exercises (OP) - 03/09/17 0001      ADLs   ADL Comments Pt/family given POP info. Reviewed with daughter at end of session coordination ex's, modifications for PWR! Seated including: less reps, focusing on PWR! Up and PWR! [place], and just high marching for [employer]! Step.      Fine Motor Coordination   Other Fine Motor Exercises See pt instructions for details   Other Fine Motor Exercises Also reviewed PWR! Hands with patient                OT Education - 03/09/17 0844    Education provided Yes   Education Details POP info, coordination HEP   Person(s) Educated Patient   Methods Explanation;Demonstration;Handout   Comprehension Verbalized understanding;Returned demonstration          OT Short Term Goals - 02/28/17 0907      OT SHORT TERM GOAL #1   Title Independent with PD specific HEP (check STG's by 03/16/17)    Time 4   Period Weeks   Status On-going     OT SHORT TERM GOAL #2   Title Pt to button/unbutton 3 buttons in less  than 60 sec.    Baseline eval = 1 min. 22 sec   Time 4   Period Weeks   Status New     OT SHORT TERM GOAL #3   Title Pt will perform PPT #4 in 40 sec. or less for greater ease with UB dressing   Baseline eval = 52.75 sec   Time 4   Period Weeks   Status New     OT SHORT TERM GOAL #4   Title Pt will improve bilateral UE functional use by increasing blocks by 6 or greater on Box & Blocks test   Baseline eval: Rt = 33, Lt = 30   Time 4   Period Weeks   Status New     OT SHORT TERM GOAL #5   Title Pt to demo overhead reaching bilaterally to 115* or greater shoulder flexion   Baseline eval: approx 100-110*   Time 4   Period Weeks   Status New     OT SHORT TERM GOAL #6   Title Pt/family will verbalize understanding of ways to prevent future PD complications and appropriate community resources prn   Time 4   Period Weeks   Status New           OT Long Term Goals - 02/16/17 1430       OT LONG TERM GOAL #1   Title Pt will verbalize understanding of adaptive strategies to incr. ease with ADLS/IADLS (Check LTG's by 9/73/53)    Time 8   Period Weeks   Status New     OT LONG TERM GOAL #2   Title Pt/family will verbalize understanding of ways to keep thinking skills sharp and ways to compensate for short term memory changes   Time 8   Period Weeks   Status New     OT LONG TERM GOAL #3   Title Pt will button/unbutton 3 buttons in 50 sec. or less   Baseline eval: 1 min. 22 sec   Time 8   Period Weeks   Status New     OT LONG TERM GOAL #4   Title Pt will improve coordination as evidenced by increasing speed on 9 hole peg test by 8 sec. or greater bilaterally   Baseline eval: Rt = 44.50 sec, Lt = 47.78 sec   Time 8   Period Weeks   Status New     OT LONG TERM GOAL #5   Title Pt will improve PPT #2 by 5 sec. or more for greater ease with feeding   Baseline eval: 19.44 sec.    Time 8   Period Weeks   Status New               Plan - 03/09/17 1040    Clinical Impression Statement Pt benefits from review d/t cognitive deficits. HEP's re-inforced at home with daughter's assist.    Rehab Potential Fair   Clinical Impairments Affecting Rehab Potential cognitive deficits   OT Frequency 2x / week   OT Duration 8 weeks   OT Treatment/Interventions Self-care/ADL training;DME and/or AE instruction;Patient/family education;Moist Heat;Therapeutic exercises;Therapeutic activities;Neuromuscular education;Functional Mobility Training;Passive range of motion;Cognitive remediation/compensation;Manual Therapy;Visual/perceptual remediation/compensation;Splinting   Plan review PWR! Seated modified, PWR! Supine with modifications prn, ADL strategies   OT Home Exercise Plan 02/28/17: PWR! Seated modified and PWR! Hands modified. 03/09/17: coordination HEP, POP info.    Consulted and Agree with Plan of Care Patient;Family member/caregiver   Family Member Consulted daughter  Patient will benefit from skilled therapeutic intervention in order to improve the following deficits and impairments:  Decreased coordination, Decreased range of motion, Difficulty walking, Impaired flexibility, Improper body mechanics, Improper spinal/pelvic alignment, Decreased safety awareness, Decreased knowledge of precautions, Decreased knowledge of use of DME, Impaired UE functional use, Pain, Decreased cognition, Decreased mobility, Decreased strength, Impaired vision/preception  Visit Diagnosis: Other symptoms and signs involving the nervous system  Other symptoms and signs involving the musculoskeletal system  Abnormal posture  Other lack of coordination    Problem List Patient Active Problem List   Diagnosis Date Noted  . Follow-up --- PCP NOTES 09/02/2015  . Neuropathy ([place]) 06/10/2015  . Pedal edema 01/30/2015  . Hip pain, chronic 01/30/2015  . Back pain 01/30/2015  . Cramps of lower extremity 01/30/2015  . Aftercare following surgery of the circulatory system, [place] 08/05/2014  . Annual physical exam 10/19/2011  . DJD (degenerative joint disease) 12/14/2010  . Anemia 06/06/2009  . COPD (chronic obstructive pulmonary disease) ([place]) 06/06/2009  . RENAL CELL CANCER 12/25/2008  . HTN (hypertension) 12/25/2008  . AAA (abdominal aortic aneurysm) without rupture ([place]) 12/25/2008  . CKD (chronic kidney disease) 12/25/2008  . HYPERCHOLESTEROLEMIA 03/01/2008  . GERD 03/01/2008  . ACNE ROSACEA 11/29/2007  . DIVERTICULOSIS, COLON 04/12/2007    Carey Bullocks, OTR/L 03/09/2017, 10:46 AM  [facility] [address]  [place], [place], [address] Phone: [phone]   Fax:  [phone]  Name: [name] MRN: [id number] Date of Birth: November 03, 1927

## 2017-03-11 NOTE — Addendum Note (Signed)
Addended by: Lianne Cure A on: 03/11/2017 03:10 PM   Modules accepted: Orders

## 2017-03-14 ENCOUNTER — Ambulatory Visit: Payer: Medicare Other | Admitting: Occupational Therapy

## 2017-03-14 DIAGNOSIS — R29898 Other symptoms and signs involving the musculoskeletal system: Secondary | ICD-10-CM

## 2017-03-14 DIAGNOSIS — R278 Other lack of coordination: Secondary | ICD-10-CM | POA: Diagnosis not present

## 2017-03-14 DIAGNOSIS — M25611 Stiffness of right shoulder, not elsewhere classified: Secondary | ICD-10-CM | POA: Diagnosis not present

## 2017-03-14 DIAGNOSIS — R293 Abnormal posture: Secondary | ICD-10-CM | POA: Diagnosis not present

## 2017-03-14 DIAGNOSIS — R29818 Other symptoms and signs involving the nervous system: Secondary | ICD-10-CM | POA: Diagnosis not present

## 2017-03-14 DIAGNOSIS — M25612 Stiffness of left shoulder, not elsewhere classified: Secondary | ICD-10-CM

## 2017-03-14 NOTE — Therapy (Signed)
[facility] [address]  [place], [place], [address] Phone: [phone]   Fax:  [phone]  Occupational Therapy Treatment  Patient Details  Name: [name] MRN: [id number] Date of Birth: 1927-12-22 Referring Provider: Dr. Jairo Ben  Encounter Date: 03/14/2017      OT End of Session - 03/14/17 1029    Visit Number 4   Number of Visits 17   Date for OT Re-Evaluation 04/16/17   Authorization Type MCR - G code needed   Authorization - Visit Number 4   Authorization - Number of Visits 10   OT Start Time 0800   OT Stop Time 0845   OT Time Calculation (min) 45 min   Activity Tolerance Patient tolerated treatment well      Past Medical History:  Diagnosis Date  . Abdominal aortic aneurysm ([place]) 09/2008   s/o endovascular repair and angiogram 10/04/08   . Anemia    EGD Cscope 08-2012  . Bleeding ulcer 1997  . CKD (chronic kidney disease), stage IV (HCC)    Stable, Dr. Dimas Aguas  . COPD (chronic obstructive pulmonary disease) ([place])   . H/O hyperkalemia    Stable  . Hypertension   . Leukopenia 2/11   thought to be from doxy  . Renal cell carcinoma    clear cell type dx 10/09, s/p excision 10-24-08  . Renal insufficiency   . Solitary left kidney    W/ chronic tubule-interstitial damage, Dr. Dimas Aguas  . Spinal stenosis    causing LE weakness-persistant, being worked up by Hydrographic surveyor and neurologist    Past Surgical History:  Procedure Laterality Date  . ABDOMINAL AORTIC ANEURYSM REPAIR  2009   EVAR  . [place]  . [place]), 2004  . NEPHRECTOMY  2009   Open nephrectomy for cancer    There were no vitals filed for this visit.      Subjective Assessment - 03/14/17 0917    Subjective  I don't get cramps in my hand as much   Patient is accompained by: Family member  daughter   Pertinent History neuropathy   Patient Stated Goals Get faster with  things   Currently in Pain? No/denies                      OT Treatments/Exercises (OP) - 03/14/17 0001      Fine Motor Coordination   Other Fine Motor Exercises Reviewed coordination HEP with patient/daughter. Pt return demo of each     Neurological Re-education Exercises   Other Exercises 1 PWR! Seated: PWR! Up, PWR! [place], and [place]! Step modified x 10 reps each   Other Exercises 2 PWR! Supine basic 4 issued but with modifications to PWR! Step (just bridging) and mod assist required for PWR! Twist. Pt required max cues and repetition for accuracy and for large amplitude movements; however daughter also present for carryover at home.                 OT Education - 03/14/17 1029    Education provided Yes   Education Details PWR! Supine with modifications   Person(s) Educated Patient;Child(ren)   Methods Explanation;Demonstration;Verbal cues;Handout;Tactile cues   Comprehension Verbal cues required;Need further instruction          OT Short Term Goals - 02/28/17 2025      OT SHORT TERM GOAL #1   Title Independent with PD  specific HEP (check STG's by 03/16/17)    Time 4   Period Weeks   Status On-going     OT SHORT TERM GOAL #2   Title Pt to button/unbutton 3 buttons in less than 60 sec.    Baseline eval = 1 min. 22 sec   Time 4   Period Weeks   Status New     OT SHORT TERM GOAL #3   Title Pt will perform PPT #4 in 40 sec. or less for greater ease with UB dressing   Baseline eval = 52.75 sec   Time 4   Period Weeks   Status New     OT SHORT TERM GOAL #4   Title Pt will improve bilateral UE functional use by increasing blocks by 6 or greater on Box & Blocks test   Baseline eval: Rt = 33, Lt = 30   Time 4   Period Weeks   Status New     OT SHORT TERM GOAL #5   Title Pt to demo overhead reaching bilaterally to 115* or greater shoulder flexion   Baseline eval: approx 100-110*   Time 4   Period Weeks   Status New     OT SHORT TERM GOAL #6    Title Pt/family will verbalize understanding of ways to prevent future PD complications and appropriate community resources prn   Time 4   Period Weeks   Status New           OT Long Term Goals - 02/16/17 1430      OT LONG TERM GOAL #1   Title Pt will verbalize understanding of adaptive strategies to incr. ease with ADLS/IADLS (Check LTG's by 1/76/16)    Time 8   Period Weeks   Status New     OT LONG TERM GOAL #2   Title Pt/family will verbalize understanding of ways to keep thinking skills sharp and ways to compensate for short term memory changes   Time 8   Period Weeks   Status New     OT LONG TERM GOAL #3   Title Pt will button/unbutton 3 buttons in 50 sec. or less   Baseline eval: 1 min. 22 sec   Time 8   Period Weeks   Status New     OT LONG TERM GOAL #4   Title Pt will improve coordination as evidenced by increasing speed on 9 hole peg test by 8 sec. or greater bilaterally   Baseline eval: Rt = 44.50 sec, Lt = 47.78 sec   Time 8   Period Weeks   Status New     OT LONG TERM GOAL #5   Title Pt will improve PPT #2 by 5 sec. or more for greater ease with feeding   Baseline eval: 19.44 sec.    Time 8   Period Weeks   Status New               Plan - 03/14/17 1030    Clinical Impression Statement Pt reports less cramping in Rt hand and observably increased ROM in shoulders. Pt still requires review/repetition d/t cognitive deficits but feels like PWR! ex's are helping   Rehab Potential Fair   Clinical Impairments Affecting Rehab Potential cognitive deficits   OT Frequency 2x / week   OT Duration 8 weeks   OT Treatment/Interventions Self-care/ADL training;DME and/or AE instruction;Patient/family education;Moist Heat;Therapeutic exercises;Therapeutic activities;Neuromuscular education;Functional Mobility Training;Passive range of motion;Cognitive remediation/compensation;Manual Therapy;Visual/perceptual remediation/compensation;Splinting   Plan Review PWR!  Supine  with modifications, ADL strategies   OT Home Exercise Plan 02/28/17: PWR! Seated modified and PWR! Hands modified. 03/09/17: coordination HEP, POP info. 03/14/17: PWR! Supine modified   Consulted and Agree with Plan of Care Patient;Family member/caregiver   Family Member Consulted daughter      Patient will benefit from skilled therapeutic intervention in order to improve the following deficits and impairments:  Decreased coordination, Decreased range of motion, Difficulty walking, Impaired flexibility, Improper body mechanics, Improper spinal/pelvic alignment, Decreased safety awareness, Decreased knowledge of precautions, Decreased knowledge of use of DME, Impaired UE functional use, Pain, Decreased cognition, Decreased mobility, Decreased strength, Impaired vision/preception  Visit Diagnosis: Other symptoms and signs involving the nervous system  Other symptoms and signs involving the musculoskeletal system  Abnormal posture  Other lack of coordination  Stiffness of right shoulder, not elsewhere classified  Stiffness of left shoulder, not elsewhere classified    Problem List Patient Active Problem List   Diagnosis Date Noted  . Follow-up --- PCP NOTES 09/02/2015  . Neuropathy ([place]) 06/10/2015  . Pedal edema 01/30/2015  . Hip pain, chronic 01/30/2015  . Back pain 01/30/2015  . Cramps of lower extremity 01/30/2015  . Aftercare following surgery of the circulatory system, [place] 08/05/2014  . Annual physical exam 10/19/2011  . DJD (degenerative joint disease) 12/14/2010  . Anemia 06/06/2009  . COPD (chronic obstructive pulmonary disease) ([place]) 06/06/2009  . RENAL CELL CANCER 12/25/2008  . HTN (hypertension) 12/25/2008  . AAA (abdominal aortic aneurysm) without rupture ([place]) 12/25/2008  . CKD (chronic kidney disease) 12/25/2008  . HYPERCHOLESTEROLEMIA 03/01/2008  . GERD 03/01/2008  . ACNE ROSACEA 11/29/2007  . DIVERTICULOSIS, COLON 04/12/2007    Carey Bullocks,  OTR/L 03/14/2017, 10:33 AM  [facility] [address]  [place], [place], [address] Phone: [phone]   Fax:  [phone]  Name: [name] MRN: [id number] Date of Birth: 02/13/1927

## 2017-03-21 ENCOUNTER — Ambulatory Visit: Payer: Medicare Other | Admitting: Occupational Therapy

## 2017-03-21 DIAGNOSIS — R278 Other lack of coordination: Secondary | ICD-10-CM | POA: Diagnosis not present

## 2017-03-21 DIAGNOSIS — R29818 Other symptoms and signs involving the nervous system: Secondary | ICD-10-CM | POA: Diagnosis not present

## 2017-03-21 DIAGNOSIS — M25611 Stiffness of right shoulder, not elsewhere classified: Secondary | ICD-10-CM

## 2017-03-21 DIAGNOSIS — M25612 Stiffness of left shoulder, not elsewhere classified: Secondary | ICD-10-CM | POA: Diagnosis not present

## 2017-03-21 DIAGNOSIS — H35041 Retinal micro-aneurysms, unspecified, right eye: Secondary | ICD-10-CM | POA: Diagnosis not present

## 2017-03-21 DIAGNOSIS — R29898 Other symptoms and signs involving the musculoskeletal system: Secondary | ICD-10-CM

## 2017-03-21 DIAGNOSIS — R293 Abnormal posture: Secondary | ICD-10-CM | POA: Diagnosis not present

## 2017-03-21 DIAGNOSIS — H04123 Dry eye syndrome of bilateral lacrimal glands: Secondary | ICD-10-CM | POA: Diagnosis not present

## 2017-03-21 DIAGNOSIS — H5203 Hypermetropia, bilateral: Secondary | ICD-10-CM | POA: Diagnosis not present

## 2017-03-21 DIAGNOSIS — H52223 Regular astigmatism, bilateral: Secondary | ICD-10-CM | POA: Diagnosis not present

## 2017-03-21 DIAGNOSIS — H16223 Keratoconjunctivitis sicca, not specified as Sjogren's, bilateral: Secondary | ICD-10-CM | POA: Diagnosis not present

## 2017-03-21 NOTE — Therapy (Signed)
[facility] [address]  [place], [place], [address] Phone: [phone]   Fax:  [phone]  Occupational Therapy Treatment  Patient Details  Name: [name] MRN: [id number] Date of Birth: [birth year] Referring Provider: Dr. Jairo Ben  Encounter Date: 03/21/2017      OT End of Session - 03/21/17 1016    Visit Number 5   Number of Visits 17   Date for OT Re-Evaluation 04/16/17   Authorization Type MCR - G code needed   Authorization - Visit Number 5   Authorization - Number of Visits 10   OT Start Time [phone]   OT Stop Time 0930   OT Time Calculation (min) 55 min   Activity Tolerance Patient tolerated treatment well      Past Medical History:  Diagnosis Date  . Abdominal aortic aneurysm ([place]) 09/2008   s/o endovascular repair and angiogram 10/04/08   . Anemia    EGD Cscope 08-2012  . Bleeding ulcer 1997  . CKD (chronic kidney disease), stage IV (HCC)    Stable, Dr. Dimas Aguas  . COPD (chronic obstructive pulmonary disease) ([facility])   . H/O hyperkalemia    Stable  . Hypertension   . Leukopenia 2/11   thought to be from doxy  . Renal cell carcinoma    clear cell type dx 10/09, s/p excision 10-24-08  . Renal insufficiency   . Solitary left kidney    W/ chronic tubule-interstitial damage, Dr. Dimas Aguas  . Spinal stenosis    causing LE weakness-persistant, being worked up by Hydrographic surveyor and neurologist    Past Surgical History:  Procedure Laterality Date  . ABDOMINAL AORTIC ANEURYSM REPAIR  2009   EVAR  . [place]  . [place] (lip), 2004  . NEPHRECTOMY  2009   Open nephrectomy for cancer    There were no vitals filed for this visit.      Subjective Assessment - 03/21/17 1009    Patient is accompained by: Family member  DAUGHTER   Pertinent History neuropathy   Patient Stated Goals Get faster with things   Currently in Pain? No/denies                       OT Treatments/Exercises (OP) - 03/21/17 0001      ADLs   Eating Practiced cutting food with strategies and deliberate, long, big strokes for cutting   UB Dressing practiced donning/doffing jacket with large amplitude movements/strategies   LB Dressing practiced hooking/unhooking belt and pant button with large amplitude movements/strategies   ADL Comments Pt/daughter given ADL strategies and reviewed strategies     Neurological Re-education Exercises   Other Exercises 1 Reviewed PWR! Supine with modifications to PWR! Step and assist with PWR! Twist. Pt needs max v.c's to perform however daughter present for all education and does with patient at home.    Other Exercises 2 Practiced sit to stand using strategies and large amplitude movements                OT Education - 03/21/17 1014    Education provided Yes   Education Details strategies for [employer]) Educated Patient;Child(ren)   Methods Explanation;Demonstration;Handout;Verbal cues   Comprehension Verbalized understanding;Returned demonstration;Verbal cues required;Need further instruction          OT Short Term Goals - 03/21/17 1016      OT SHORT TERM GOAL #1  Title Pt's family Independent with PD specific HEP (check STG's by 03/16/17)    Time 4   Period Weeks   Status Revised     OT SHORT TERM GOAL #2   Title Pt to button/unbutton 3 buttons in less than 60 sec.    Baseline eval = 1 min. 22 sec   Time 4   Period Weeks   Status On-going     OT SHORT TERM GOAL #3   Title Pt will perform PPT #4 in 40 sec. or less for greater ease with UB dressing   Baseline eval = 52.75 sec   Time 4   Period Weeks   Status On-going     OT SHORT TERM GOAL #4   Title Pt will improve bilateral UE functional use by increasing blocks by 6 or greater on Box & Blocks test   Baseline eval: Rt = 33, Lt = 30   Time 4   Period Weeks   Status On-going     OT SHORT TERM GOAL #5   Title Pt  to demo overhead reaching bilaterally to 115* or greater shoulder flexion   Baseline eval: approx 100-110*   Time 4   Period Weeks   Status On-going     OT SHORT TERM GOAL #6   Title Pt/family will verbalize understanding of ways to prevent future PD complications and appropriate community resources prn   Time 4   Period Weeks   Status On-going           OT Long Term Goals - 03/21/17 1017      OT LONG TERM GOAL #1   Title Pt will verbalize understanding of adaptive strategies to incr. ease with ADLS/IADLS (Check LTG's by 1/63/84)    Time 8   Period Weeks   Status On-going     OT LONG TERM GOAL #2   Title Pt/family will verbalize understanding of ways to keep thinking skills sharp and ways to compensate for short term memory changes   Time 8   Period Weeks   Status New     OT LONG TERM GOAL #3   Title Pt will button/unbutton 3 buttons in 50 sec. or less   Baseline eval: 1 min. 22 sec   Time 8   Period Weeks   Status New     OT LONG TERM GOAL #4   Title Pt will improve coordination as evidenced by increasing speed on 9 hole peg test by 8 sec. or greater bilaterally   Baseline eval: Rt = 44.50 sec, Lt = 47.78 sec   Time 8   Period Weeks   Status New     OT LONG TERM GOAL #5   Title Pt will improve PPT #2 by 5 sec. or more for greater ease with feeding   Baseline eval: 19.44 sec.    Time 8   Period Weeks   Status New               Plan - 03/21/17 1017    Clinical Impression Statement Pt making progress towards all STG's. Pt with improvements in donning/doffing jacket and hooking button after repetition   Rehab Potential Fair   Clinical Impairments Affecting Rehab Potential cognitive deficits   OT Frequency 2x / week   OT Duration 8 weeks   OT Treatment/Interventions Self-care/ADL training;DME and/or AE instruction;Patient/family education;Moist Heat;Therapeutic exercises;Therapeutic activities;Neuromuscular education;Functional Mobility Training;Passive  range of motion;Cognitive remediation/compensation;Manual Therapy;Visual/perceptual remediation/compensation;Splinting   Plan PWR! Standing and PWR! Seated review, issue  bag ex's. Begin ex chart for pt/family to follow. (following session: assess STG's, functional standing/reaching tasks)   OT Home Exercise Plan 02/28/17: PWR! Seated modified and PWR! Hands modified. 03/09/17: coordination HEP, POP info. 03/14/17: PWR! Supine modified. 03/21/17: ADL strategies   Consulted and Agree with Plan of Care Patient;Family member/caregiver   Family Member Consulted daughter      Patient will benefit from skilled therapeutic intervention in order to improve the following deficits and impairments:  Decreased coordination, Decreased range of motion, Difficulty walking, Impaired flexibility, Improper body mechanics, Improper spinal/pelvic alignment, Decreased safety awareness, Decreased knowledge of precautions, Decreased knowledge of use of DME, Impaired UE functional use, Pain, Decreased cognition, Decreased mobility, Decreased strength, Impaired vision/preception  Visit Diagnosis: Other symptoms and signs involving the nervous system  Other symptoms and signs involving the musculoskeletal system  Other lack of coordination  Stiffness of right shoulder, not elsewhere classified  Stiffness of left shoulder, not elsewhere classified    Problem List Patient Active Problem List   Diagnosis Date Noted  . Follow-up --- PCP NOTES 09/02/2015  . Neuropathy ([facility]) 06/10/2015  . Pedal edema 01/30/2015  . Hip pain, chronic 01/30/2015  . Back pain 01/30/2015  . Cramps of lower extremity 01/30/2015  . Aftercare following surgery of the circulatory system, [place] 08/05/2014  . Annual physical exam 10/19/2011  . DJD (degenerative joint disease) 12/14/2010  . Anemia 06/06/2009  . COPD (chronic obstructive pulmonary disease) ([place]) 06/06/2009  . RENAL CELL CANCER 12/25/2008  . HTN (hypertension) 12/25/2008  . AAA  (abdominal aortic aneurysm) without rupture ([facility]) 12/25/2008  . CKD (chronic kidney disease) 12/25/2008  . HYPERCHOLESTEROLEMIA 03/01/2008  . GERD 03/01/2008  . ACNE ROSACEA 11/29/2007  . DIVERTICULOSIS, COLON 04/12/2007    Carey Bullocks, OTR/L 03/21/2017, 10:20 AM  [facility] [address]  [place], [place], [address] Phone: [phone]   Fax:  [phone]  Name: HOMMER CUNLIFFE MRN: [id number] Date of Birth: Oct 12, 1927

## 2017-03-21 NOTE — Patient Instructions (Signed)
Performing Daily Activities with Big Movements  Pick at least one activity a day and perform with BIG, DELIBERATE movements/effort. This can make the activity easier and turn daily activities into exercise!  If you are standing during the activity, make sure to keep feet apart and stand with good/big/PWR! UP posture.  Examples:  {Performing Daily Activities with Big Movements  Dressing - Push arms in sleeves, twist when putting on jacket, push foot into pants, open hands to pull down shirt/put on socks/pull up pants  Bathing - Wash/dry with long strokes  Brushing your teeth - Big, slow movements  Cutting food - Long deliberate cuts, hold fork and knife in middle of utensil  Opening jar/bottle - Move as much as you can with each turn  Picking up a cup/bottle - Open hand up big and get object all the way in palm  Hanging up clothes/getting clothes down from closet - Reach with big effort  Putting away groceries/dishes - Reach with big effort  Wiping counter/table - Move in big, long strokes  Walking into a store/restaurant - Walk with big steps, swing arms if able  Standing up from a chair/recliner/sofa - Scoot forward, lean forward, and stand with big effort

## 2017-03-23 ENCOUNTER — Ambulatory Visit: Payer: Medicare Other | Admitting: Occupational Therapy

## 2017-03-23 DIAGNOSIS — R29898 Other symptoms and signs involving the musculoskeletal system: Secondary | ICD-10-CM | POA: Diagnosis not present

## 2017-03-23 DIAGNOSIS — R29818 Other symptoms and signs involving the nervous system: Secondary | ICD-10-CM | POA: Diagnosis not present

## 2017-03-23 DIAGNOSIS — R278 Other lack of coordination: Secondary | ICD-10-CM

## 2017-03-23 DIAGNOSIS — R293 Abnormal posture: Secondary | ICD-10-CM | POA: Diagnosis not present

## 2017-03-23 DIAGNOSIS — M25611 Stiffness of right shoulder, not elsewhere classified: Secondary | ICD-10-CM | POA: Diagnosis not present

## 2017-03-23 DIAGNOSIS — M25612 Stiffness of left shoulder, not elsewhere classified: Secondary | ICD-10-CM

## 2017-03-23 NOTE — Patient Instructions (Addendum)
(  Exercise) Monday Tuesday Wednesday Thursday Friday Saturday Sunday                                                                                                             

## 2017-03-23 NOTE — Therapy (Signed)
[facility] [address]  [place], [place], [address] Phone: [phone]   Fax:  [phone]  Occupational Therapy Treatment  Patient Details  Name: [name] MRN: [id number] Date of Birth: 02-02-1927 Referring Provider: Dr. Jairo Ben  Encounter Date: 03/23/2017      OT End of Session - 03/23/17 1035    Visit Number 6   Number of Visits 17   Date for OT Re-Evaluation 04/16/17   Authorization Type MCR - G code needed   Authorization - Visit Number 6   Authorization - Number of Visits 10   OT Start Time [phone]   OT Stop Time 0930   OT Time Calculation (min) 55 min   Activity Tolerance Patient tolerated treatment well      Past Medical History:  Diagnosis Date  . Abdominal aortic aneurysm ([place]) 09/2008   s/o endovascular repair and angiogram 10/04/08   . Anemia    EGD Cscope 08-2012  . Bleeding ulcer 1997  . CKD (chronic kidney disease), stage IV (HCC)    Stable, Dr. Dimas Aguas  . COPD (chronic obstructive pulmonary disease) ([facility])   . H/O hyperkalemia    Stable  . Hypertension   . Leukopenia 2/11   thought to be from doxy  . Renal cell carcinoma    clear cell type dx 10/09, s/p excision 10-24-08  . Renal insufficiency   . Solitary left kidney    W/ chronic tubule-interstitial damage, Dr. Dimas Aguas  . Spinal stenosis    causing LE weakness-persistant, being worked up by Hydrographic surveyor and neurologist    Past Surgical History:  Procedure Laterality Date  . ABDOMINAL AORTIC ANEURYSM REPAIR  2009   EVAR  . [place]  . [place] ([place]), 2004  . NEPHRECTOMY  2009   Open nephrectomy for cancer    There were no vitals filed for this visit.      Subjective Assessment - 03/23/17 1027    Subjective  [name] reports pt doing exercises with her   Patient is accompained by: Family member  daughter   Pertinent History neuropathy   Patient Stated Goals Get  faster with things   Currently in Pain? No/denies                      OT Treatments/Exercises (OP) - 03/23/17 0001      ADLs   ADL Comments Pt/family given ex chart and how to incorporate all HEP's for greater carryover/consistency. Daughter verbalized understanding of how to divide ex's equally t/o week. Also suggested ways to incorporate cognition into daily ex's including: counting aloud, counting backwards, etc while performing ex's. Also recommended doing PWR! moves to music to help with carryover, sequencing, motivation     Neurological Re-education Exercises   Other Exercises 1 PWR! Seated and PWR! Standing basic 4 with modifications prn x 5 reps each side ( 10 total). Pt unable to get proper technique of PWR! Rock in standing I'ly, however when therapist did with hand held assist, pt able to do. Therapist recommended daughter do this with him and she verbalized understanding.    Other Exercises 2 Bag ex's issued to assist with large amplitude movements while giving visual target. Pt performed well with cueing                OT Education - 03/23/17 1026    Education provided Yes   Education  Details Bag HEP, Exercise chart with recommendations on how to do specific HEP's on different days   Person(s) Educated Patient;Child(ren)   Methods Explanation;Demonstration;Handout;Verbal cues   Comprehension Verbalized understanding;Returned demonstration;Verbal cues required          OT Short Term Goals - 03/21/17 1016      OT SHORT TERM GOAL #1   Title Pt's family Independent with PD specific HEP (check STG's by 03/16/17)    Time 4   Period Weeks   Status Revised     OT SHORT TERM GOAL #2   Title Pt to button/unbutton 3 buttons in less than 60 sec.    Baseline eval = 1 min. 22 sec   Time 4   Period Weeks   Status On-going     OT SHORT TERM GOAL #3   Title Pt will perform PPT #4 in 40 sec. or less for greater ease with UB dressing   Baseline eval = 52.75  sec   Time 4   Period Weeks   Status On-going     OT SHORT TERM GOAL #4   Title Pt will improve bilateral UE functional use by increasing blocks by 6 or greater on Box & Blocks test   Baseline eval: Rt = 33, Lt = 30   Time 4   Period Weeks   Status On-going     OT SHORT TERM GOAL #5   Title Pt to demo overhead reaching bilaterally to 115* or greater shoulder flexion   Baseline eval: approx 100-110*   Time 4   Period Weeks   Status On-going     OT SHORT TERM GOAL #6   Title Pt/family will verbalize understanding of ways to prevent future PD complications and appropriate community resources prn   Time 4   Period Weeks   Status On-going           OT Long Term Goals - 03/21/17 1017      OT LONG TERM GOAL #1   Title Pt will verbalize understanding of adaptive strategies to incr. ease with ADLS/IADLS (Check LTG's by 0/86/57)    Time 8   Period Weeks   Status On-going     OT LONG TERM GOAL #2   Title Pt/family will verbalize understanding of ways to keep thinking skills sharp and ways to compensate for short term memory changes   Time 8   Period Weeks   Status New     OT LONG TERM GOAL #3   Title Pt will button/unbutton 3 buttons in 50 sec. or less   Baseline eval: 1 min. 22 sec   Time 8   Period Weeks   Status New     OT LONG TERM GOAL #4   Title Pt will improve coordination as evidenced by increasing speed on 9 hole peg test by 8 sec. or greater bilaterally   Baseline eval: Rt = 44.50 sec, Lt = 47.78 sec   Time 8   Period Weeks   Status New     OT LONG TERM GOAL #5   Title Pt will improve PPT #2 by 5 sec. or more for greater ease with feeding   Baseline eval: 19.44 sec.    Time 8   Period Weeks   Status New               Plan - 03/23/17 1037    Clinical Impression Statement Pt making progress towards all STG's with assist from daughter. Pt motivated despite cognitive deficits  and demo observable improvement in reaching, posture, and functional  tasks.    Rehab Potential Fair   Clinical Impairments Affecting Rehab Potential cognitive deficits   OT Frequency 2x / week   OT Duration 8 weeks   OT Treatment/Interventions Self-care/ADL training;DME and/or AE instruction;Patient/family education;Moist Heat;Therapeutic exercises;Therapeutic activities;Neuromuscular education;Functional Mobility Training;Passive range of motion;Cognitive remediation/compensation;Manual Therapy;Visual/perceptual remediation/compensation;Splinting   Plan assess STG's, schedule more appts as able, functional reaching tasks in standing   OT Home Exercise Plan 02/28/17: PWR! Seated modified and PWR! Hands modified. 03/09/17: coordination HEP, POP info. 03/14/17: PWR! Supine modified. 03/21/17: ADL strategies. 03/23/17: Bag HEP, EXchart   Consulted and Agree with Plan of Care Patient;Family member/caregiver   Family Member Consulted daughter      Patient will benefit from skilled therapeutic intervention in order to improve the following deficits and impairments:  Decreased coordination, Decreased range of motion, Difficulty walking, Impaired flexibility, Improper body mechanics, Improper spinal/pelvic alignment, Decreased safety awareness, Decreased knowledge of precautions, Decreased knowledge of use of DME, Impaired UE functional use, Pain, Decreased cognition, Decreased mobility, Decreased strength, Impaired vision/preception  Visit Diagnosis: Other symptoms and signs involving the nervous system  Other symptoms and signs involving the musculoskeletal system  Other lack of coordination  Stiffness of right shoulder, not elsewhere classified  Stiffness of left shoulder, not elsewhere classified  Abnormal posture    Problem List Patient Active Problem List   Diagnosis Date Noted  . Follow-up --- PCP NOTES 09/02/2015  . Neuropathy ([facility]) 06/10/2015  . Pedal edema 01/30/2015  . Hip pain, chronic 01/30/2015  . Back pain 01/30/2015  . Cramps of lower extremity  01/30/2015  . Aftercare following surgery of the circulatory system, [place] 08/05/2014  . Annual physical exam 10/19/2011  . DJD (degenerative joint disease) 12/14/2010  . Anemia 06/06/2009  . COPD (chronic obstructive pulmonary disease) ([facility]) 06/06/2009  . RENAL CELL CANCER 12/25/2008  . HTN (hypertension) 12/25/2008  . AAA (abdominal aortic aneurysm) without rupture ([facility]) 12/25/2008  . CKD (chronic kidney disease) 12/25/2008  . HYPERCHOLESTEROLEMIA 03/01/2008  . GERD 03/01/2008  . ACNE ROSACEA 11/29/2007  . DIVERTICULOSIS, COLON 04/12/2007    Carey Bullocks, OTR/L 03/23/2017, 10:40 AM  [facility] [address]  [place], [place], [address] Phone: [phone]   Fax:  [phone]  Name: [name] MRN: [id number] Date of Birth: 06-Apr-1927

## 2017-03-31 ENCOUNTER — Ambulatory Visit (INDEPENDENT_AMBULATORY_CARE_PROVIDER_SITE_OTHER): Payer: Medicare Other | Admitting: Specialist

## 2017-03-31 ENCOUNTER — Encounter (INDEPENDENT_AMBULATORY_CARE_PROVIDER_SITE_OTHER): Payer: Self-pay | Admitting: Specialist

## 2017-03-31 VITALS — BP 177/75 | HR 56 | Ht 69.0 in | Wt 172.0 lb

## 2017-03-31 DIAGNOSIS — M4726 Other spondylosis with radiculopathy, lumbar region: Secondary | ICD-10-CM | POA: Diagnosis not present

## 2017-03-31 DIAGNOSIS — R2681 Unsteadiness on feet: Secondary | ICD-10-CM

## 2017-03-31 NOTE — Progress Notes (Signed)
Office Visit Note   Patient: [name]           Date of Birth: 06/28/1927           MRN: [id number] Visit Date: 03/31/2017              Requested by: Colon Branch, MD [address] [place],  [address] PCP: Kathlene November, MD   Assessment & Plan: Visit Diagnoses:  1. Unsteady gait   2. Other spondylosis with radiculopathy, lumbar region     Plan: Patient was given another prescription for physical therapy to work on balance/coordination, upper/lower extremity strengthening. Follow-up in 6 months for recheck. Return sooner if needed.  Follow-Up Instructions: Return in about 6 months (around 09/30/2017).   Orders:  Orders Placed This Encounter  Procedures  . Ambulatory referral to Physical Therapy   No orders of the defined types were placed in this encounter.     Procedures: No procedures performed   Clinical Data: No additional findings.   Subjective: Chief Complaint  Patient presents with  . Lower Back - Follow-up    HPI Patient returns with his daughter today with complaints of unsteady gait, bilateral lower extremity weakness. Symptoms haven't greatly progress from last office visit year ago. They're requesting updated this with therapy prescription. Has been working with occupational therapy. Patient is also been followed by a neurologist.       Review of Systems  Respiratory: Negative.   Cardiovascular: Positive for leg swelling.  Musculoskeletal: Positive for back pain and gait problem.  Neurological: Positive for weakness.     Objective: Vital Signs: BP (!) 177/75 (BP Location: Left Arm, Patient Position: Sitting)   Pulse (!) 56   Ht 5\' 9"  (1.753 m)   Wt 172 lb (78 kg)   BMI 25.40 kg/m   Physical Exam  Constitutional: He is oriented to person, place, and time. No distress.  HENT:  Head: Normocephalic and atraumatic.  Musculoskeletal:  Negative straight leg raise. Bilateral calves nontender. 3+ bilateral pretibial edema.    Neurological: He is alert and oriented to person, place, and time.  Skin: Skin is warm and dry.    Ortho Exam  Specialty Comments:  No specialty comments available.  Imaging: No results found.   PMFS History: Patient Active Problem List   Diagnosis Date Noted  . Follow-up --- PCP NOTES 09/02/2015  . Neuropathy ([facility]) 06/10/2015  . Pedal edema 01/30/2015  . Hip pain, chronic 01/30/2015  . Back pain 01/30/2015  . Cramps of lower extremity 01/30/2015  . Aftercare following surgery of the circulatory system, [place] 08/05/2014  . Annual physical exam 10/19/2011  . DJD (degenerative joint disease) 12/14/2010  . Anemia 06/06/2009  . COPD (chronic obstructive pulmonary disease) ([facility]) 06/06/2009  . RENAL CELL CANCER 12/25/2008  . HTN (hypertension) 12/25/2008  . AAA (abdominal aortic aneurysm) without rupture ([facility]) 12/25/2008  . CKD (chronic kidney disease) 12/25/2008  . HYPERCHOLESTEROLEMIA 03/01/2008  . GERD 03/01/2008  . ACNE ROSACEA 11/29/2007  . DIVERTICULOSIS, COLON 04/12/2007   Past Medical History:  Diagnosis Date  . Abdominal aortic aneurysm ([facility]) 09/2008   s/o endovascular repair and angiogram 10/04/08   . Anemia    EGD Cscope 08-2012  . Bleeding ulcer 1997  . CKD (chronic kidney disease), stage IV (HCC)    Stable, Dr. Dimas Aguas  . COPD (chronic obstructive pulmonary disease) ([place])   . H/O hyperkalemia    Stable  . Hypertension   . Leukopenia  2/11   thought to be from doxy  . Renal cell carcinoma    clear cell type dx 10/09, s/p excision 10-24-08  . Renal insufficiency   . Solitary left kidney    W/ chronic tubule-interstitial damage, Dr. Dimas Aguas  . Spinal stenosis    causing LE weakness-persistant, being worked up by Hydrographic surveyor and neurologist    Family History  Problem Relation Age of Onset  . Diabetes Father     ?  . Stroke Father   . Hypertension Father   . Colon cancer Neg Hx   . Prostate cancer Neg Hx     Past Surgical History:   Procedure Laterality Date  . ABDOMINAL AORTIC ANEURYSM REPAIR  2009   EVAR  . [place]  . [place] ([place]), 2004  . NEPHRECTOMY  2009   Open nephrectomy for cancer   Social History   Occupational History  . retired Retired   Social History Main Topics  . Smoking status: Former Smoker    Quit date: 12/28/2007  . Smokeless tobacco: Never Used     Comment: used to smoke 1.5 ppd  . Alcohol use No  . Drug use: No  . Sexual activity: Not on file

## 2017-04-01 ENCOUNTER — Ambulatory Visit: Payer: Medicare Other | Attending: Neurology | Admitting: Occupational Therapy

## 2017-04-01 DIAGNOSIS — R269 Unspecified abnormalities of gait and mobility: Secondary | ICD-10-CM | POA: Diagnosis not present

## 2017-04-01 DIAGNOSIS — M25611 Stiffness of right shoulder, not elsewhere classified: Secondary | ICD-10-CM

## 2017-04-01 DIAGNOSIS — R29898 Other symptoms and signs involving the musculoskeletal system: Secondary | ICD-10-CM

## 2017-04-01 DIAGNOSIS — M25612 Stiffness of left shoulder, not elsewhere classified: Secondary | ICD-10-CM | POA: Diagnosis not present

## 2017-04-01 DIAGNOSIS — R278 Other lack of coordination: Secondary | ICD-10-CM | POA: Diagnosis not present

## 2017-04-01 DIAGNOSIS — R29818 Other symptoms and signs involving the nervous system: Secondary | ICD-10-CM | POA: Diagnosis not present

## 2017-04-01 DIAGNOSIS — R41840 Attention and concentration deficit: Secondary | ICD-10-CM | POA: Insufficient documentation

## 2017-04-01 DIAGNOSIS — R2681 Unsteadiness on feet: Secondary | ICD-10-CM | POA: Insufficient documentation

## 2017-04-01 DIAGNOSIS — R293 Abnormal posture: Secondary | ICD-10-CM

## 2017-04-01 DIAGNOSIS — R251 Tremor, unspecified: Secondary | ICD-10-CM | POA: Diagnosis not present

## 2017-04-01 NOTE — Therapy (Signed)
[facility] [address]  [place], [place], [address] Phone: [phone]   Fax:  [phone]  Occupational Therapy Treatment  Patient Details  Name: [name] MRN: [id number] Date of Birth: 1927-07-04 Referring Provider: Dr. Jairo Ben  Encounter Date: 04/01/2017      OT End of Session - 04/01/17 0958    Visit Number 7   Number of Visits 17   Date for OT Re-Evaluation 04/16/17   Authorization Type MCR - G code needed   Authorization - Visit Number 7   Authorization - Number of Visits 10   OT Start Time 0850   OT Stop Time 0950   OT Time Calculation (min) 60 min   Activity Tolerance Patient tolerated treatment well      Past Medical History:  Diagnosis Date  . Abdominal aortic aneurysm ([place]) 09/2008   s/o endovascular repair and angiogram 10/04/08   . Anemia    EGD Cscope 08-2012  . Bleeding ulcer 1997  . CKD (chronic kidney disease), stage IV (HCC)    Stable, Dr. Dimas Aguas  . COPD (chronic obstructive pulmonary disease) ([place])   . H/O hyperkalemia    Stable  . Hypertension   . Leukopenia 2/11   thought to be from doxy  . Renal cell carcinoma    clear cell type dx 10/09, s/p excision 10-24-08  . Renal insufficiency   . Solitary left kidney    W/ chronic tubule-interstitial damage, Dr. Dimas Aguas  . Spinal stenosis    causing LE weakness-persistant, being worked up by Hydrographic surveyor and neurologist    Past Surgical History:  Procedure Laterality Date  . ABDOMINAL AORTIC ANEURYSM REPAIR  2009   EVAR  . [place]  . [place] ([place]), 2004  . NEPHRECTOMY  2009   Open nephrectomy for cancer    There were no vitals filed for this visit.      Subjective Assessment - 04/01/17 0902    Subjective  We've been doing the exercises   Patient is accompained by: Family member   Pertinent History neuropathy   Patient Stated Goals Get faster with things   Currently in  Pain? No/denies            [facility] OT Assessment - 04/01/17 0001      Observation/Other Assessments   Donning Doffing Jacket Time (seconds) 35.71 sec., 45.04 sec (avg 40 sec)   Donning Doffing Jacket Comments Button/unbutton test: 1 min. 25 sec.      Coordination   Box and Blocks Rt = 37, Lt = 35 (not fully clearing partition on Lt)                   OT Treatments/Exercises (OP) - 04/01/17 0001      ADLs   ADL Comments Assessed STG's and discussed/reviewed results with pt/family. Also further discussed benefits of exercise and community resources and [facility].      Neurological Re-education Exercises   Other Exercises 1 Standing ex's for wt shifts, trunk rotation, posture while using visual targets and use of scarfs. Pt required max cueing and repetition but improved w/ repetition                  OT Short Term Goals - 04/01/17 3818      OT SHORT TERM GOAL #1   Title Pt's family Independent with PD specific HEP (check STG's by 03/16/17)  Time 4   Period Weeks   Status Achieved     OT SHORT TERM GOAL #2   Title Pt to button/unbutton 3 buttons in less than 60 sec.    Baseline eval = 1 min. 22 sec   Time 4   Period Weeks   Status Not Met     OT SHORT TERM GOAL #3   Title Pt will perform PPT #4 in 40 sec. or less for greater ease with UB dressing   Baseline eval = 52.75 sec   Time 4   Period Weeks   Status Achieved  04/01/17: 35.71 sec, 45.04 sec (avg 40 sec)     OT SHORT TERM GOAL #4   Title Pt will improve bilateral UE functional use by increasing blocks by 6 or greater on Box & Blocks test   Baseline eval: Rt = 33, Lt = 30   Time 4   Period Weeks   Status Partially Met  04/01/17: Rt = 37, Lt = 35     OT SHORT TERM GOAL #5   Title Pt to demo overhead reaching bilaterally to 115* or greater shoulder flexion   Baseline eval: approx 100-110*   Time 4   Period Weeks   Status Partially Met  Rt = 115*, Lt = 105*     OT SHORT TERM GOAL  #6   Title Pt/family will verbalize understanding of ways to prevent future PD complications and appropriate community resources prn   Time 4   Period Weeks   Status Achieved           OT Long Term Goals - 03/21/17 1017      OT LONG TERM GOAL #1   Title Pt will verbalize understanding of adaptive strategies to incr. ease with ADLS/IADLS (Check LTG's by 08/14/55)    Time 8   Period Weeks   Status On-going     OT LONG TERM GOAL #2   Title Pt/family will verbalize understanding of ways to keep thinking skills sharp and ways to compensate for short term memory changes   Time 8   Period Weeks   Status New     OT LONG TERM GOAL #3   Title Pt will button/unbutton 3 buttons in 50 sec. or less   Baseline eval: 1 min. 22 sec   Time 8   Period Weeks   Status New     OT LONG TERM GOAL #4   Title Pt will improve coordination as evidenced by increasing speed on 9 hole peg test by 8 sec. or greater bilaterally   Baseline eval: Rt = 44.50 sec, Lt = 47.78 sec   Time 8   Period Weeks   Status New     OT LONG TERM GOAL #5   Title Pt will improve PPT #2 by 5 sec. or more for greater ease with feeding   Baseline eval: 19.44 sec.    Time 8   Period Weeks   Status New               Plan - 04/01/17 1000    Clinical Impression Statement Pt making progress towards all STG's except STG #2. Pt with improved posture and functional use of BUE's   Rehab Potential Fair   Clinical Impairments Affecting Rehab Potential cognitive deficits   OT Frequency 2x / week   OT Duration 8 weeks   OT Treatment/Interventions Self-care/ADL training;DME and/or AE instruction;Patient/family education;Moist Heat;Therapeutic exercises;Therapeutic activities;Neuromuscular education;Functional Mobility Training;Passive range of motion;Cognitive remediation/compensation;Manual  Therapy;Visual/perceptual remediation/compensation;Splinting   Plan continue coordination, functional reaching in standing, issue  national website info and ways to keep thinking skills sharp      Patient will benefit from skilled therapeutic intervention in order to improve the following deficits and impairments:  Decreased coordination, Decreased range of motion, Difficulty walking, Impaired flexibility, Improper body mechanics, Improper spinal/pelvic alignment, Decreased safety awareness, Decreased knowledge of precautions, Decreased knowledge of use of DME, Impaired UE functional use, Pain, Decreased cognition, Decreased mobility, Decreased strength, Impaired vision/preception  Visit Diagnosis: Other symptoms and signs involving the nervous system  Other symptoms and signs involving the musculoskeletal system  Other lack of coordination  Stiffness of right shoulder, not elsewhere classified  Stiffness of left shoulder, not elsewhere classified  Abnormal posture    Problem List Patient Active Problem List   Diagnosis Date Noted  . Follow-up --- PCP NOTES 09/02/2015  . Neuropathy ([place]) 06/10/2015  . Pedal edema 01/30/2015  . Hip pain, chronic 01/30/2015  . Back pain 01/30/2015  . Cramps of lower extremity 01/30/2015  . Aftercare following surgery of the circulatory system, [place] 08/05/2014  . Annual physical exam 10/19/2011  . DJD (degenerative joint disease) 12/14/2010  . Anemia 06/06/2009  . COPD (chronic obstructive pulmonary disease) ([place]) 06/06/2009  . RENAL CELL CANCER 12/25/2008  . HTN (hypertension) 12/25/2008  . AAA (abdominal aortic aneurysm) without rupture ([place]) 12/25/2008  . CKD (chronic kidney disease) 12/25/2008  . HYPERCHOLESTEROLEMIA 03/01/2008  . GERD 03/01/2008  . ACNE ROSACEA 11/29/2007  . DIVERTICULOSIS, COLON 04/12/2007    Carey Bullocks, OTR/L 04/01/2017, 10:03 AM  [facility] [address]  [place], [place], [address] Phone: [phone]   Fax:  [phone]  Name: [name] MRN: [id number] Date  of Birth: August 30, 1927

## 2017-04-04 DIAGNOSIS — I10 Essential (primary) hypertension: Secondary | ICD-10-CM | POA: Diagnosis not present

## 2017-04-04 DIAGNOSIS — M849 Disorder of continuity of bone, unspecified: Secondary | ICD-10-CM | POA: Diagnosis not present

## 2017-04-04 LAB — BASIC METABOLIC PANEL
BUN: 29 mg/dL — AB (ref 4–21)
Creatinine: 2.3 mg/dL — AB (ref 0.6–1.3)
Glucose: 91 mg/dL
POTASSIUM: 4.3 mmol/L (ref 3.4–5.3)
SODIUM: 145 mmol/L (ref 137–147)

## 2017-04-05 DIAGNOSIS — R300 Dysuria: Secondary | ICD-10-CM | POA: Diagnosis not present

## 2017-04-05 DIAGNOSIS — N359 Urethral stricture, unspecified: Secondary | ICD-10-CM | POA: Diagnosis not present

## 2017-04-06 ENCOUNTER — Ambulatory Visit: Payer: Medicare Other | Admitting: Occupational Therapy

## 2017-04-06 DIAGNOSIS — R278 Other lack of coordination: Secondary | ICD-10-CM

## 2017-04-06 DIAGNOSIS — R29898 Other symptoms and signs involving the musculoskeletal system: Secondary | ICD-10-CM

## 2017-04-06 DIAGNOSIS — R251 Tremor, unspecified: Secondary | ICD-10-CM

## 2017-04-06 DIAGNOSIS — M25612 Stiffness of left shoulder, not elsewhere classified: Secondary | ICD-10-CM | POA: Diagnosis not present

## 2017-04-06 DIAGNOSIS — M25611 Stiffness of right shoulder, not elsewhere classified: Secondary | ICD-10-CM

## 2017-04-06 DIAGNOSIS — R293 Abnormal posture: Secondary | ICD-10-CM | POA: Diagnosis not present

## 2017-04-06 DIAGNOSIS — R29818 Other symptoms and signs involving the nervous system: Secondary | ICD-10-CM | POA: Diagnosis not present

## 2017-04-06 NOTE — Therapy (Signed)
[facility] [address]  [place], [place], [address] Phone: [phone]   Fax:  [phone]  Occupational Therapy Treatment  Patient Details  Name: [name] MRN: [id number] Date of Birth: Jul 21, 1927 Referring Provider: Dr. Jairo Ben  Encounter Date: 04/06/2017      OT End of Session - 04/06/17 1331    Visit Number 8   Number of Visits 17   Date for OT Re-Evaluation 04/16/17   Authorization Type MCR - G code needed   Authorization - Visit Number 8   Authorization - Number of Visits 10   OT Start Time 1601   OT Stop Time 1325   OT Time Calculation (min) 50 min   Activity Tolerance Patient tolerated treatment well      Past Medical History:  Diagnosis Date  . Abdominal aortic aneurysm ([place]) 09/2008   s/o endovascular repair and angiogram 10/04/08   . Anemia    EGD Cscope 08-2012  . Bleeding ulcer 1997  . CKD (chronic kidney disease), stage IV (HCC)    Stable, Dr. Dimas Aguas  . COPD (chronic obstructive pulmonary disease) ([place])   . H/O hyperkalemia    Stable  . Hypertension   . Leukopenia 2/11   thought to be from doxy  . Renal cell carcinoma    clear cell type dx 10/09, s/p excision 10-24-08  . Renal insufficiency   . Solitary left kidney    W/ chronic tubule-interstitial damage, Dr. Dimas Aguas  . Spinal stenosis    causing LE weakness-persistant, being worked up by Hydrographic surveyor and neurologist    Past Surgical History:  Procedure Laterality Date  . ABDOMINAL AORTIC ANEURYSM REPAIR  2009   EVAR  . [place]  . [place] ([place]), 2004  . NEPHRECTOMY  2009   Open nephrectomy for cancer    There were no vitals filed for this visit.      Subjective Assessment - 04/06/17 1236    Subjective  My legs feel tired   Pertinent History neuropathy   Patient Stated Goals Get faster with things   Currently in Pain? No/denies                       OT Treatments/Exercises (OP) - 04/06/17 0001      ADLs   UB Dressing Practiced hooking/unhooking buttons with mod difficulty and extra time. Practiced donning jacket with strategies for large amplitude movements - pt able to do quickly w/o difficulty!     Exercises   Exercises Shoulder     Shoulder Exercises: ROM/Strengthening   UBE (Upper Arm Bike) x 5 minutes Level 1 maintaining > 40 rpm for reciprocal movement while maintaing speed     Fine Motor Coordination   Fine Motor Coordination Flipping cards;Manipulation of small objects;Stacking coins   Manipulation of small objects Rotation of ball in fingertips Rt hand with min difficulty but improved; Lt hand with mod difficulty keeping ball upright, but improved with repetition. Manipulating stress balls with Rt hand requiring min assist on ulnar side, with Lt hand I'ly   Flipping cards for finger extension, supination and cues for large amplitude movements. Then pushing cards out 1 at a time for finger extension     Functional Reaching Activities   Mid Level Mid level reaching BUE's while standing for wt shifts, trunk rotation, large amplitude movements during functional reaching (using clothespins). Followed by standing with use  of boomwackers for modified PWR! Moves in standing with countertop support for balance                  OT Short Term Goals - 04/01/17 0958      OT SHORT TERM GOAL #1   Title Pt's family Independent with PD specific HEP (check STG's by 03/16/17)    Time 4   Period Weeks   Status Achieved     OT SHORT TERM GOAL #2   Title Pt to button/unbutton 3 buttons in less than 60 sec.    Baseline eval = 1 min. 22 sec   Time 4   Period Weeks   Status Not Met     OT SHORT TERM GOAL #3   Title Pt will perform PPT #4 in 40 sec. or less for greater ease with UB dressing   Baseline eval = 52.75 sec   Time 4   Period Weeks   Status Achieved  04/01/17: 35.71 sec, 45.04 sec (avg 40 sec)     OT SHORT TERM GOAL  #4   Title Pt will improve bilateral UE functional use by increasing blocks by 6 or greater on Box & Blocks test   Baseline eval: Rt = 33, Lt = 30   Time 4   Period Weeks   Status Partially Met  04/01/17: Rt = 37, Lt = 35     OT SHORT TERM GOAL #5   Title Pt to demo overhead reaching bilaterally to 115* or greater shoulder flexion   Baseline eval: approx 100-110*   Time 4   Period Weeks   Status Partially Met  Rt = 115*, Lt = 105*     OT SHORT TERM GOAL #6   Title Pt/family will verbalize understanding of ways to prevent future PD complications and appropriate community resources prn   Time 4   Period Weeks   Status Achieved           OT Long Term Goals - 03/21/17 1017      OT LONG TERM GOAL #1   Title Pt will verbalize understanding of adaptive strategies to incr. ease with ADLS/IADLS (Check LTG's by 3/32/95)    Time 8   Period Weeks   Status On-going     OT LONG TERM GOAL #2   Title Pt/family will verbalize understanding of ways to keep thinking skills sharp and ways to compensate for short term memory changes   Time 8   Period Weeks   Status New     OT LONG TERM GOAL #3   Title Pt will button/unbutton 3 buttons in 50 sec. or less   Baseline eval: 1 min. 22 sec   Time 8   Period Weeks   Status New     OT LONG TERM GOAL #4   Title Pt will improve coordination as evidenced by increasing speed on 9 hole peg test by 8 sec. or greater bilaterally   Baseline eval: Rt = 44.50 sec, Lt = 47.78 sec   Time 8   Period Weeks   Status New     OT LONG TERM GOAL #5   Title Pt will improve PPT #2 by 5 sec. or more for greater ease with feeding   Baseline eval: 19.44 sec.    Time 8   Period Weeks   Status New               Plan - 04/06/17 1332    Clinical Impression Statement Pt  progressing with posture, coordination, and ADL's. Pt with easier time donning jacket today   Rehab Potential Fair   Clinical Impairments Affecting Rehab Potential cognitive deficits    OT Frequency 2x / week   OT Duration 8 weeks   OT Treatment/Interventions Self-care/ADL training;DME and/or AE instruction;Patient/family education;Moist Heat;Therapeutic exercises;Therapeutic activities;Neuromuscular education;Functional Mobility Training;Passive range of motion;Cognitive remediation/compensation;Manual Therapy;Visual/perceptual remediation/compensation;Splinting   Plan renewal next session (re-assess some STG's prn), issue national/local website and resources info, and issue cognitive tips/ways to keep skills sharp (since daughter not present for educ. today), review bag exercises, UBE   OT Home Exercise Plan 02/28/17: PWR! Seated modified and PWR! Hands modified. 03/09/17: coordination HEP, POP info. 03/14/17: PWR! Supine modified. 03/21/17: ADL strategies. 03/23/17: Bag HEP, EXchart   Consulted and Agree with Plan of Care Patient      Patient will benefit from skilled therapeutic intervention in order to improve the following deficits and impairments:  Decreased coordination, Decreased range of motion, Difficulty walking, Impaired flexibility, Improper body mechanics, Improper spinal/pelvic alignment, Decreased safety awareness, Decreased knowledge of precautions, Decreased knowledge of use of DME, Impaired UE functional use, Pain, Decreased cognition, Decreased mobility, Decreased strength, Impaired vision/preception  Visit Diagnosis: Other symptoms and signs involving the nervous system  Other symptoms and signs involving the musculoskeletal system  Other lack of coordination  Stiffness of right shoulder, not elsewhere classified  Stiffness of left shoulder, not elsewhere classified  Tremor    Problem List Patient Active Problem List   Diagnosis Date Noted  . Follow-up --- PCP NOTES 09/02/2015  . Neuropathy ([place]) 06/10/2015  . Pedal edema 01/30/2015  . Hip pain, chronic 01/30/2015  . Back pain 01/30/2015  . Cramps of lower extremity 01/30/2015  . Aftercare  following surgery of the circulatory system, [place] 08/05/2014  . Annual physical exam 10/19/2011  . DJD (degenerative joint disease) 12/14/2010  . Anemia 06/06/2009  . COPD (chronic obstructive pulmonary disease) ([place]) 06/06/2009  . RENAL CELL CANCER 12/25/2008  . HTN (hypertension) 12/25/2008  . AAA (abdominal aortic aneurysm) without rupture ([place]) 12/25/2008  . CKD (chronic kidney disease) 12/25/2008  . HYPERCHOLESTEROLEMIA 03/01/2008  . GERD 03/01/2008  . ACNE ROSACEA 11/29/2007  . DIVERTICULOSIS, COLON 04/12/2007    Carey Bullocks, OTR/L 04/06/2017, 1:36 PM  [facility] [address]  [place], [place], [address] Phone: [phone]   Fax:  [phone]  Name: [name] MRN: [id number] Date of Birth: Jan 12, 1927

## 2017-04-07 DIAGNOSIS — N390 Urinary tract infection, site not specified: Secondary | ICD-10-CM | POA: Diagnosis not present

## 2017-04-07 DIAGNOSIS — I10 Essential (primary) hypertension: Secondary | ICD-10-CM | POA: Diagnosis not present

## 2017-04-07 DIAGNOSIS — N184 Chronic kidney disease, stage 4 (severe): Secondary | ICD-10-CM | POA: Diagnosis not present

## 2017-04-12 ENCOUNTER — Ambulatory Visit: Payer: Medicare Other | Attending: Surgery | Admitting: Physical Therapy

## 2017-04-12 DIAGNOSIS — R2689 Other abnormalities of gait and mobility: Secondary | ICD-10-CM | POA: Insufficient documentation

## 2017-04-12 DIAGNOSIS — R2681 Unsteadiness on feet: Secondary | ICD-10-CM | POA: Diagnosis not present

## 2017-04-12 DIAGNOSIS — R262 Difficulty in walking, not elsewhere classified: Secondary | ICD-10-CM | POA: Diagnosis not present

## 2017-04-12 DIAGNOSIS — R293 Abnormal posture: Secondary | ICD-10-CM | POA: Insufficient documentation

## 2017-04-12 NOTE — Therapy (Signed)
[facility] [address]  [place], [place], [address] Phone: [phone]   Fax:  [phone]  Physical Therapy Evaluation  Patient Details  Name: [name] MRN: [id number] Date of Birth: 1927/12/14 Referring Provider: Jessy Oto, MD  Encounter Date: 04/12/2017      PT End of Session - 04/12/17 0845    Visit Number 1   Number of Visits 16   Date for PT Re-Evaluation 06/10/17   Authorization Type Medicare / BCBS   PT Start Time 0845   PT Stop Time 0934   PT Time Calculation (min) 49 min   Activity Tolerance Patient tolerated treatment well   Behavior During Therapy [facility] for tasks assessed/performed      Past Medical History:  Diagnosis Date  . Abdominal aortic aneurysm ([place]) 09/2008   s/o endovascular repair and angiogram 10/04/08   . Anemia    EGD Cscope 08-2012  . Bleeding ulcer 1997  . CKD (chronic kidney disease), stage IV (HCC)    Stable, Dr. Dimas Aguas  . COPD (chronic obstructive pulmonary disease) ([place])   . H/O hyperkalemia    Stable  . Hypertension   . Leukopenia 2/11   thought to be from doxy  . Renal cell carcinoma    clear cell type dx 10/09, s/p excision 10-24-08  . Renal insufficiency   . Solitary left kidney    W/ chronic tubule-interstitial damage, Dr. Dimas Aguas  . Spinal stenosis    causing LE weakness-persistant, being worked up by Hydrographic surveyor and neurologist    Past Surgical History:  Procedure Laterality Date  . ABDOMINAL AORTIC ANEURYSM REPAIR  2009   EVAR  . [place]  . [place] (lip), 2004  . NEPHRECTOMY  2009   Open nephrectomy for cancer    There were no vitals filed for this visit.       Subjective Assessment - 04/12/17 0849    Subjective Pt reporting "walking is getting a little harder", feeling like he is carrying 500# of weight on his legs. Reporting sensation of "warm hot bubbles on the bottom of his feet when there is  nothign there." Denies pain but states "tiredness is worse than the pain".   Currently in Pain? No/denies   Pain Score 0-No pain   Pain Location Leg   Pain Orientation Right;Left   Pain Descriptors / Indicators Heaviness;Tiring;Cramping   Pain Type Neuropathic pain   Pain Radiating Towards waist to feet & "out the toenails"   Pain Onset More than a month ago  ~6 months   Pain Frequency Intermittent   Aggravating Factors  unknown   Pain Relieving Factors nothing   Effect of Pain on Daily Activities makes walking harder            [facility] PT Assessment - 04/12/17 0845      Assessment   Medical Diagnosis Unsteady gait   Referring Provider Jessy Oto, MD   Onset Date/Surgical Date --  ~6 months   Next MD Visit 09/30/17 with Dr. Louanne Skye; 05/12/17 with [name] (PCP)   Prior Therapy mutiple prior episodes for lumbar stenosis, Parkinson's and gait disorders     Precautions   Precautions Fall     Balance Screen   Has the patient fallen in the past 6 months No   Has the patient had a decrease in activity level because of a fear of falling?  Yes  Is the patient reluctant to leave their home because of a fear of falling?  No     Home Social worker Private residence   Living Arrangements Alone  Dtr [name]) lives nearby   Available Help at Discharge Family   Type of [place] Access Level entry   [place] One level   [place] - 2 wheels;Cane - quad;Grab bars - tub/shower;Grab bars - toilet;Shower seat     Prior Function   Level of Independence Independent with basic ADLs;Independent with household mobility without device;Independent with community mobility without device;Needs assistance with homemaking   Vocation Retired   Leisure walking, working out at gym     Observation/Other Assessments   Focus on Therapeutic Outcomes (FOTO)  Neuromuscular disorder - 45% (55% limitation); predicted 56% (44% limitation)     Posture/Postural  Control   Posture/Postural Control Postural limitations   Postural Limitations Forward head;Rounded Shoulders;Increased thoracic kyphosis;Decreased lumbar lordosis;Flexed trunk     ROM / Strength   AROM / PROM / Strength Strength     Strength   Overall Strength Comments tested in sitting   Strength Assessment Site Hip;Knee;Ankle   Right/Left Hip Right;Left   Right Hip Flexion 4-/5   Right Hip Extension 4-/5   Right Hip External Rotation  4-/5   Right Hip Internal Rotation 4-/5   Right Hip ABduction 4-/5   Right Hip ADduction 3+/5   Left Hip Flexion 4-/5   Left Hip Extension 4-/5   Left Hip External Rotation 3+/5   Left Hip Internal Rotation 4-/5   Left Hip ABduction 4-/5   Left Hip ADduction 3+/5   Right/Left Knee Right;Left   Right Knee Flexion 4-/5   Right Knee Extension 4/5   Left Knee Flexion 4-/5   Left Knee Extension 4/5   Right/Left Ankle Right;Left   Right Ankle Dorsiflexion 4-/5   Left Ankle Dorsiflexion 4-/5     Transfers   Five time sit to stand comments  walking, working out at gym     Ambulation/Gait   Ambulation/Gait Yes   Ambulation/Gait Assistance 5: Supervision   Assistive device None   Gait Pattern Step-through pattern;Decreased stride length;Decreased step length - right;Decreased step length - left;Decreased dorsiflexion - right;Decreased dorsiflexion - left;Poor foot clearance - left;Poor foot clearance - right;Right foot flat;Left foot flat;Decreased trunk rotation;Decreased arm swing - right;Decreased arm swing - left   Gait velocity 1.81 ft/sec     Standardized Balance Assessment   Standardized Balance Assessment Berg Balance Test;Timed Up and Go Test;Five Times Sit to Stand;10 meter walk test   Five times sit to stand comments  20.22" with use of armrests   10 Meter Walk 18.12     Berg Balance Test   Sit to Stand Able to stand  independently using hands   Standing Unsupported Able to stand 2 minutes with supervision   Sitting with Back  Unsupported but Feet Supported on Floor or Stool Able to sit safely and securely 2 minutes   Stand to Sit Uses backs of legs against chair to control descent   Transfers Able to transfer safely, definite need of hands   Standing Unsupported with Eyes Closed Able to stand 10 seconds with supervision   Standing Ubsupported with Feet Together Able to place feet together independently and stand for 1 minute with supervision   From Standing, Reach Forward with Outstretched Arm Reaches forward but needs supervision   From Standing Position, Pick up Object from  Floor Unable to pick up shoe, but reaches 2-5 cm (1-2") from shoe and balances independently   From Standing Position, Turn to Look Behind Over each Shoulder Needs supervision when turning   Turn 360 Degrees Able to turn 360 degrees safely but slowly   Standing Unsupported, Alternately Place Feet on Step/Stool Able to complete >2 steps/needs minimal assist   Standing Unsupported, One Foot in [place] to take small step independently and hold 30 seconds   Standing on One Leg Tries to lift leg/unable to hold 3 seconds but remains standing independently   Total Score 31   Berg comment: < 36 high risk for falls (close to 100%)     Timed Up and Go Test   Normal TUG (seconds) 18.22   Manual TUG (seconds) 21.72   Cognitive TUG (seconds) 45.63   TUG Comments high fall risk per all versions of TUG                           PT Education - 04/12/17 0934    Education provided Yes   Education Details PT eval findings & anticipated POC   Person(s) Educated Patient;Child(ren)   Methods Explanation   Comprehension Verbalized understanding          PT Short Term Goals - 04/12/17 0934      PT SHORT TERM GOAL #1   Title Assess dynamic gait stability per DGI or FGA by 04/22/17   Status New     PT SHORT TERM GOAL #2   Title Independent with OTAGO Fall Prevention Program HEP at supported level by 05/13/17   Status New            PT Long Term Goals - 04/12/17 0934      PT LONG TERM GOAL #1   Title Pt will be independent with OTAGO fall prevention HEP at unsupported level or other advanced balance HEP by 06/10/17   Status New     PT LONG TERM GOAL #2   Title B LE strength to >/= 4/5 for improved function by 06/10/17   Status New     PT LONG TERM GOAL #3   Title Pt will demonstrate improvement on the Berg Balance Scale to 48/56 or greater to reduce risk for falls by 06/10/17   Status New     PT LONG TERM GOAL #4   Title Pt will improve gait speed to 2.6 ft/sec or greater for improved safety with community ambulation by 06/10/17   Status New               Plan - 04/12/17 0934    Clinical Impression Statement [name] is an 83 y/o male who presents to OP PT for a moderate complexity eval for unsteady gait. Pt and daughter report pt has been slowing down with decreased activity tolerance over the past few months preventing him from participating in the activities he normally enjoys such as going to the gym and walking on the treadmill and doing his exercises as he normally would. Pt has history of Parkinson's disease along with low back pain from osteoarthritis and lumbar spinal stenosis which creates a sensation of "heaviness and tired feeling" in legs, together which contribute to lack of  balance and gait stability. Pt completed a prior PT episodes for similar complaints, benefitting from PT during each episode, with the most recent episode completed in December 2017. Since this time, he has demonstrated a decline in LE  strength especially in the hips (previously 4+/5), a significant decrease in gait speed from 2.84 ft/sec to 1.81 ft/sec, and a decline in balance per the Berg to 31/56 from 48/56 indicating a nearly 100% risk for falls. Pt denies low back pain at present, but complains of persistant heaviness and tired feeling from the waist down to the toes. Given prior positive response to PT and higher  PLOF, pt demonstrates good potential to benefit from skilled PT with POC to focus on core/proximal LE strengthening to improve posture and lumbar support as well as balance, along with balance and dynamic gait activities to decrease risk for falls.   Rehab Potential Good   Clinical Impairments Affecting Rehab Potential Advanced age, Parkinson's disease, OA of mutiple joints, lumbar stenosis, LE neuropathy, HTN, COPD, CKD   PT Frequency 2x / week   PT Duration 8 weeks   PT Treatment/Interventions Patient/family education;Neuromuscular re-education;Balance training;Therapeutic exercise;Therapeutic activities;Functional mobility training;Gait training;Manual techniques;Taping;Dry needling;Electrical Stimulation;Moist Heat;Cryotherapy;ADLs/Self Care Home Management   PT Next Visit Plan Assess DGI vs FGA for dynamic gait assessment, assess flexibility to determine need for home stretching program   Consulted and Agree with Plan of Care Patient;Family member/caregiver   Family Member Consulted Dtr - [name]      Patient will benefit from skilled therapeutic intervention in order to improve the following deficits and impairments:  Abnormal gait, Difficulty walking, Decreased activity tolerance, Decreased balance, Decreased coordination, Decreased safety awareness, Decreased strength, Impaired flexibility, Decreased range of motion, Postural dysfunction  Visit Diagnosis: Other abnormalities of gait and mobility  Difficulty in walking, not elsewhere classified  Unsteadiness on feet  Abnormal posture      G-Codes - 05-10-17 0934    Functional Assessment Tool Used (Outpatient Only) Berg = 31/56 (44.6% limited) + Clinical judgement   Functional Limitation Mobility: Walking and moving around   Mobility: Walking and Moving Around Current Status [phone]) At least 40 percent but less than 60 percent impaired, limited or restricted   Mobility: Walking and Moving Around Goal Status [phone]) At least 20  percent but less than 40 percent impaired, limited or restricted       Problem List Patient Active Problem List   Diagnosis Date Noted  . Follow-up --- PCP NOTES 09/02/2015  . Neuropathy 06/10/2015  . Pedal edema 01/30/2015  . Hip pain, chronic 01/30/2015  . Back pain 01/30/2015  . Cramps of lower extremity 01/30/2015  . Aftercare following surgery of the circulatory system, [place] 08/05/2014  . Annual physical exam 10/19/2011  . DJD (degenerative joint disease) 12/14/2010  . Anemia 06/06/2009  . COPD (chronic obstructive pulmonary disease) ([place]) 06/06/2009  . RENAL CELL CANCER 12/25/2008  . HTN (hypertension) 12/25/2008  . AAA (abdominal aortic aneurysm) without rupture ([place]) 12/25/2008  . CKD (chronic kidney disease) 12/25/2008  . HYPERCHOLESTEROLEMIA 03/01/2008  . GERD 03/01/2008  . ACNE ROSACEA 11/29/2007  . DIVERTICULOSIS, COLON 04/12/2007    Percival Spanish, PT, MPT 10-May-2017, 6:46 PM  [facility] [address]  [place], [place], [address] Phone: [phone]   Fax:  [phone]  Name: [name] MRN: [id number] Date of Birth: 08/14/1927

## 2017-04-14 ENCOUNTER — Encounter: Payer: Self-pay | Admitting: Internal Medicine

## 2017-04-18 ENCOUNTER — Ambulatory Visit: Payer: Medicare Other | Admitting: Physical Therapy

## 2017-04-18 DIAGNOSIS — R262 Difficulty in walking, not elsewhere classified: Secondary | ICD-10-CM | POA: Diagnosis not present

## 2017-04-18 DIAGNOSIS — L218 Other seborrheic dermatitis: Secondary | ICD-10-CM | POA: Diagnosis not present

## 2017-04-18 DIAGNOSIS — L821 Other seborrheic keratosis: Secondary | ICD-10-CM | POA: Diagnosis not present

## 2017-04-18 DIAGNOSIS — R2689 Other abnormalities of gait and mobility: Secondary | ICD-10-CM | POA: Diagnosis not present

## 2017-04-18 DIAGNOSIS — D225 Melanocytic nevi of trunk: Secondary | ICD-10-CM | POA: Diagnosis not present

## 2017-04-18 DIAGNOSIS — R293 Abnormal posture: Secondary | ICD-10-CM | POA: Diagnosis not present

## 2017-04-18 DIAGNOSIS — R2681 Unsteadiness on feet: Secondary | ICD-10-CM

## 2017-04-18 NOTE — Therapy (Signed)
[facility] [address]  [place], [place], [address] Phone: [phone]   Fax:  [phone]  Physical Therapy Treatment  Patient Details  Name: [name] MRN: [id number] Date of Birth: 1927/02/11 Referring Provider: Jessy Oto, MD  Encounter Date: 04/18/2017      PT End of Session - 04/18/17 1610    Visit Number 2   Number of Visits 16   Date for PT Re-Evaluation 06/10/17   Authorization Type Medicare / BCBS   PT Start Time 1740   PT Stop Time 1658   PT Time Calculation (min) 48 min   Activity Tolerance Patient tolerated treatment well   Behavior During Therapy [facility] for tasks assessed/performed      Past Medical History:  Diagnosis Date  . Abdominal aortic aneurysm ([place]) 09/2008   s/o endovascular repair and angiogram 10/04/08   . Anemia    EGD Cscope 08-2012  . Bleeding ulcer 1997  . CKD (chronic kidney disease), stage IV (HCC)    Stable, Dr. Dimas Aguas  . COPD (chronic obstructive pulmonary disease) ([place])   . H/O hyperkalemia    Stable  . Hypertension   . Leukopenia 2/11   thought to be from doxy  . Renal cell carcinoma    clear cell type dx 10/09, s/p excision 10-24-08  . Renal insufficiency   . Solitary left kidney    W/ chronic tubule-interstitial damage, Dr. Dimas Aguas  . Spinal stenosis    causing LE weakness-persistant, being worked up by Hydrographic surveyor and neurologist    Past Surgical History:  Procedure Laterality Date  . ABDOMINAL AORTIC ANEURYSM REPAIR  2009   EVAR  . [place]  . [place] ([place]), 2004  . NEPHRECTOMY  2009   Open nephrectomy for cancer    There were no vitals filed for this visit.      Subjective Assessment - 04/18/17 1613    Subjective Pt reports he is only able to walk to the mailbox and back before tiring.   Patient Stated Goals "Walk good"   Currently in Pain? No/denies   Pain Onset --  ~6 months             [facility] PT Assessment - 04/18/17 1610      Standardized Balance Assessment   Standardized Balance Assessment Dynamic Gait Index     Dynamic Gait Index   Level Surface Moderate Impairment   Change in Gait Speed Moderate Impairment   Gait with Horizontal Head Turns Moderate Impairment   Gait with Vertical Head Turns Moderate Impairment   Gait and Pivot Turn Moderate Impairment   Step Over Obstacle Mild Impairment   Step Around Obstacles Mild Impairment   Steps Moderate Impairment   Total Score 10   DGI comment: Scores of 19 or less are predictive of falls in older community living adults                     [facility] Adult PT Treatment/Exercise - 04/18/17 1610      Self-Care   Self-Care Posture   Posture Postural re-education on wall/doorframe 10x10" - pt cued to roll back along doorframe, extending arms out to sides while retracting scapulae     Lumbar Exercises: Stretches   Passive Hamstring Stretch 30 seconds;2 reps   Passive Hamstring Stretch Limitations supine with strap   Single Knee to Chest Stretch 30 seconds;2 reps   Single  Knee to Chest Stretch Limitations with opp LE straight for slight hip flexor stretch   Piriformis Stretch 30 seconds;2 reps   Piriformis Stretch Limitations KTOS     Knee/Hip Exercises: Aerobic   Recumbent Bike lvl 1 x 5'           [facility]) - 04/18/17 1610    [facility]WR! exercises Functional moves   [facility]! Sit to Stand 2x5             PT Education - 04/18/17 1658    Education provided Yes   Education Details LE/lumbar stretches, postural training against wall/doorframe; provided dtr, [name], with copy of [employer] program for review (anticipate initiation of program on next visit)   Person(s) Educated Patient;Child(ren)   Methods Explanation;Demonstration;Handout   Comprehension Verbalized understanding;Returned demonstration;Need further instruction          PT Short Term Goals - 04/18/17 1615      PT SHORT TERM  GOAL #1   Title Assess dynamic gait stability per DGI or FGA by 04/22/17   Status Achieved     PT SHORT TERM GOAL #2   Title Independent with OTAGO Fall Prevention Program HEP at supported level by 05/13/17   Status On-going           PT Long Term Goals - 04/18/17 1616      PT LONG TERM GOAL #1   Title Pt will be independent with OTAGO fall prevention HEP at unsupported level or other advanced balance HEP by 06/10/17   Status On-going     PT LONG TERM GOAL #2   Title B LE strength to >/= 4/5 for improved function by 06/10/17   Status On-going     PT LONG TERM GOAL #3   Title Pt will demonstrate improvement on the Berg Balance Scale to 48/56 or greater to reduce risk for falls by 06/10/17   Status On-going     PT LONG TERM GOAL #4   Title Pt will improve gait speed to 2.6 ft/sec or greater for improved safety with community ambulation by 06/10/17   Status On-going               Plan - 04/18/17 1616    Clinical Impression Statement Pt demostrating decreased flexibility in lumbar and proximal LE musculature, therefore instructed in stretches for HEP with instructions also reviewed with pt's dtr, [name]. Provided postural training/re-education using doorframe for tactile feedback, encouraging trunk extension and scapular retraction. DGI confirmed high risk for falls, therefore will plan to introduce [place] program at next visit (copy provided to dtr, [name] for review prior to initiation of training).   Rehab Potential Good   Clinical Impairments Affecting Rehab Potential Advanced age, Parkinson's disease, OA of mutiple joints, lumbar stenosis, LE neuropathy, HTN, COPD, CKD   PT Treatment/Interventions Patient/family education;Neuromuscular re-education;Balance training;Therapeutic exercise;Therapeutic activities;Functional mobility training;Gait training;Manual techniques;Taping;Dry needling;Electrical Stimulation;Moist Heat;Cryotherapy;ADLs/Self Care Home Management    PT Next Visit Plan Review stretches and postural re-education; [place] and Agree with Plan of Care Patient;Family member/caregiver   Family Member Consulted Dtr - [name]      Patient will benefit from skilled therapeutic intervention in order to improve the following deficits and impairments:  Abnormal gait, Difficulty walking, Decreased activity tolerance, Decreased balance, Decreased coordination, Decreased safety awareness, Decreased strength, Impaired flexibility, Decreased range of motion, Postural dysfunction  Visit Diagnosis: Other abnormalities of gait and mobility  Difficulty in walking, not elsewhere classified  Unsteadiness  on feet     Problem List Patient Active Problem List   Diagnosis Date Noted  . Follow-up --- PCP NOTES 09/02/2015  . Neuropathy 06/10/2015  . Pedal edema 01/30/2015  . Hip pain, chronic 01/30/2015  . Back pain 01/30/2015  . Cramps of lower extremity 01/30/2015  . Aftercare following surgery of the circulatory system, [place] 08/05/2014  . Annual physical exam 10/19/2011  . DJD (degenerative joint disease) 12/14/2010  . Anemia 06/06/2009  . COPD (chronic obstructive pulmonary disease) ([place]) 06/06/2009  . RENAL CELL CANCER 12/25/2008  . HTN (hypertension) 12/25/2008  . AAA (abdominal aortic aneurysm) without rupture ([place]) 12/25/2008  . CKD (chronic kidney disease) 12/25/2008  . HYPERCHOLESTEROLEMIA 03/01/2008  . GERD 03/01/2008  . ACNE ROSACEA 11/29/2007  . DIVERTICULOSIS, COLON 04/12/2007    Percival Spanish, PT, MPT 04/18/2017, 6:12 PM  [facility] [address]  [place], [place], [address] Phone: [phone]   Fax:  [phone]  Name: [name] MRN: [id number] Date of Birth: [birth year]

## 2017-04-20 ENCOUNTER — Ambulatory Visit: Payer: Medicare Other | Admitting: Occupational Therapy

## 2017-04-20 DIAGNOSIS — R269 Unspecified abnormalities of gait and mobility: Secondary | ICD-10-CM

## 2017-04-20 DIAGNOSIS — R278 Other lack of coordination: Secondary | ICD-10-CM | POA: Diagnosis not present

## 2017-04-20 DIAGNOSIS — R41840 Attention and concentration deficit: Secondary | ICD-10-CM

## 2017-04-20 DIAGNOSIS — M25612 Stiffness of left shoulder, not elsewhere classified: Secondary | ICD-10-CM

## 2017-04-20 DIAGNOSIS — R29898 Other symptoms and signs involving the musculoskeletal system: Secondary | ICD-10-CM | POA: Diagnosis not present

## 2017-04-20 DIAGNOSIS — R293 Abnormal posture: Secondary | ICD-10-CM

## 2017-04-20 DIAGNOSIS — R29818 Other symptoms and signs involving the nervous system: Secondary | ICD-10-CM

## 2017-04-20 DIAGNOSIS — M25611 Stiffness of right shoulder, not elsewhere classified: Secondary | ICD-10-CM | POA: Diagnosis not present

## 2017-04-20 DIAGNOSIS — R2681 Unsteadiness on feet: Secondary | ICD-10-CM

## 2017-04-20 NOTE — Therapy (Signed)
[facility] [address]  [place], [place], [address] Phone: [phone]   Fax:  [phone]  Occupational Therapy Treatment  Patient Details  Name: [name] MRN: [id number] Date of Birth: 1927-06-09 Referring Provider: Dr. Jairo Ben  Encounter Date: 04/20/2017      OT End of Session - 04/20/17 0905    Visit Number 9   Number of Visits 17   Date for OT Re-Evaluation 05/20/17   Authorization Type MCR - G code needed   Authorization - Visit Number 9   Authorization - Number of Visits 10   OT Start Time 0800   OT Stop Time 0850   OT Time Calculation (min) 50 min   Activity Tolerance Patient tolerated treatment well      Past Medical History:  Diagnosis Date  . Abdominal aortic aneurysm ([place]) 09/2008   s/o endovascular repair and angiogram 10/04/08   . Anemia    EGD Cscope 08-2012  . Bleeding ulcer 1997  . CKD (chronic kidney disease), stage IV (HCC)    Stable, Dr. Dimas Aguas  . COPD (chronic obstructive pulmonary disease) ([place])   . H/O hyperkalemia    Stable  . Hypertension   . Leukopenia 2/11   thought to be from doxy  . Renal cell carcinoma    clear cell type dx 10/09, s/p excision 10-24-08  . Renal insufficiency   . Solitary left kidney    W/ chronic tubule-interstitial damage, Dr. Dimas Aguas  . Spinal stenosis    causing LE weakness-persistant, being worked up by Hydrographic surveyor and neurologist    Past Surgical History:  Procedure Laterality Date  . ABDOMINAL AORTIC ANEURYSM REPAIR  2009   EVAR  . [place]  . [place]), 2004  . NEPHRECTOMY  2009   Open nephrectomy for cancer    There were no vitals filed for this visit.      Subjective Assessment - 04/20/17 0806    Subjective  From my beltline down hurts   Pertinent History neuropathy   Patient Stated Goals Get faster with things   Currently in Pain? Yes  LE's - O.T. not addressing                       OT Treatments/Exercises (OP) - 04/20/17 0001      ADLs   ADL Comments Pt issued issued national/local website info and resources. Pt issued cognitive tips and ways to keep thinking skills sharp. Reviewed with patient, and at end of session with daughter. Re-assessed STG's and assessed LTG's for renewal - see goal section.      Exercises   Exercises --  UBE x 5 min. Level 2, RPM's > 35     Neurological Re-education Exercises   Other Exercises 1 Reviewed bag exercises with patient: pt performed each x 5-10 reps                OT Education - 04/20/17 0904    Education provided Yes   Education Details National/local websites, resources, ways to keep thinking skills sharp, and cognitive tips   Person(s) Educated Patient   Methods Explanation;Handout   Comprehension Verbalized understanding          OT Short Term Goals - 04/20/17 0906      OT SHORT TERM GOAL #1   Title Pt's family Independent with PD specific HEP (check STG's by 03/16/17)  Time 4   Period Weeks   Status Achieved     OT SHORT TERM GOAL #2   Title Pt to button/unbutton 3 buttons in less than 60 sec.    Baseline eval = 1 min. 22 sec   Time 4   Period Weeks   Status Not Met  04/20/17: 68 sec. (after 1 practice)     OT SHORT TERM GOAL #3   Title Pt will perform PPT #4 in 40 sec. or less for greater ease with UB dressing   Baseline eval = 52.75 sec   Time 4   Period Weeks   Status Achieved  04/01/17: 35.71 sec, 45.04 sec (avg 40 sec)     OT SHORT TERM GOAL #4   Title Pt will improve bilateral UE functional use by increasing blocks by 6 or greater on Box & Blocks test   Baseline eval: Rt = 33, Lt = 30   Time 4   Period Weeks   Status Not Met  04/01/17: Rt = 37, Lt = 35, 04/20/17: Rt = 34, Lt = 32     OT SHORT TERM GOAL #5   Title Pt to demo overhead reaching bilaterally to 115* or greater shoulder flexion   Baseline eval: approx 100-110*   Time 4   Period Weeks    Status Partially Met  Rt = 115*, Lt = 105*     OT SHORT TERM GOAL #6   Title Pt/family will verbalize understanding of ways to prevent future PD complications and appropriate community resources prn   Time 4   Period Weeks   Status Achieved           OT Long Term Goals - 04/20/17 7425      OT LONG TERM GOAL #1   Title Pt will verbalize understanding of adaptive strategies to incr. ease with ADLS/IADLS (Check LTG's by 9/56/38)    Time 8   Period Weeks   Status On-going  for renewal period beginning 04/20/17)     OT LONG TERM GOAL #2   Title Pt/family will verbalize understanding of ways to keep thinking skills sharp and ways to compensate for short term memory changes   Time 8   Period Weeks   Status On-going  04/20/17: issued, but may need review     OT LONG TERM GOAL #3   Title Pt will button/unbutton 3 buttons in 65 sec. or less   Baseline eval: 1 min. 22 sec   Time 8   Period Weeks   Status Revised  for renewal period beginning 04/20/17     OT LONG TERM GOAL #4   Title Pt will improve coordination as evidenced by increasing speed on 9 hole peg test by 4 sec. or greater bilaterally   Baseline eval: Rt = 44.50 sec, Lt = 47.78 sec   Time 8   Period Weeks   Status Revised  04/20/17: Rt = 44.85 sec., Lt = 51.41 sec     OT LONG TERM GOAL #5   Title Pt will improve PPT #2 by 5 sec. or more for greater ease with feeding   Baseline eval: 19.44 sec., 04/20/17: 20.34 sec   Time 8   Period Weeks   Status Not Met  will defer this goal secondary to [facility]               Plan - 04/20/17 0911    Clinical Impression Statement Pt seen today for renewal (see goals for updates). Pt has  had slight decline in coordination, however pt unable to be consistently seen at this location d/t scheduling conflicts. Pt continues to benefit from therapy and making slow progress functionally   Rehab Potential Fair   Clinical Impairments Affecting Rehab Potential cognitive deficits   OT  Frequency 2x / week   OT Duration 4 weeks  beginning 04/20/17   OT Treatment/Interventions Self-care/ADL training;DME and/or AE instruction;Patient/family education;Moist Heat;Therapeutic exercises;Therapeutic activities;Neuromuscular education;Functional Mobility Training;Passive range of motion;Cognitive remediation/compensation;Manual Therapy;Visual/perceptual remediation/compensation;Splinting   Plan G-code, continue coordination, standing and supine with PWR! Moves (using functional tasks). Renewal completed today 04/20/17. (Plan is to transfer pt to neuro location in [place] if daughter able to transport pt to this location)   Ponder 02/28/17: PWR! Seated modified and PWR! Hands modified. 03/09/17: coordination HEP, POP info. 03/14/17: PWR! Supine modified. 03/21/17: ADL strategies. 03/23/17: Bag HEP, EXchart. 04/20/17: local and national websites/resources, cognitive tips and ways to keep thinking skills sharp   Consulted and Agree with Plan of Care Patient;Family member/caregiver      Patient will benefit from skilled therapeutic intervention in order to improve the following deficits and impairments:  Decreased coordination, Decreased range of motion, Difficulty walking, Impaired flexibility, Improper body mechanics, Improper spinal/pelvic alignment, Decreased safety awareness, Decreased knowledge of precautions, Decreased knowledge of use of DME, Impaired UE functional use, Pain, Decreased cognition, Decreased mobility, Decreased strength, Impaired vision/preception  Visit Diagnosis: Abnormal posture - Plan: Ot plan of care cert/re-cert  Other symptoms and signs involving the nervous system - Plan: Ot plan of care cert/re-cert  Other symptoms and signs involving the musculoskeletal system - Plan: Ot plan of care cert/re-cert  Other lack of coordination - Plan: Ot plan of care cert/re-cert  Stiffness of right shoulder, not elsewhere classified - Plan: Ot plan of care  cert/re-cert  Stiffness of left shoulder, not elsewhere classified - Plan: Ot plan of care cert/re-cert  Attention and concentration deficit - Plan: Ot plan of care cert/re-cert  Unsteadiness on feet - Plan: Ot plan of care cert/re-cert  Abnormality of gait and mobility - Plan: Ot plan of care cert/re-cert    Problem List Patient Active Problem List   Diagnosis Date Noted  . Follow-up --- PCP NOTES 09/02/2015  . Neuropathy 06/10/2015  . Pedal edema 01/30/2015  . Hip pain, chronic 01/30/2015  . Back pain 01/30/2015  . Cramps of lower extremity 01/30/2015  . Aftercare following surgery of the circulatory system, [place] 08/05/2014  . Annual physical exam 10/19/2011  . DJD (degenerative joint disease) 12/14/2010  . Anemia 06/06/2009  . COPD (chronic obstructive pulmonary disease) ([place]) 06/06/2009  . RENAL CELL CANCER 12/25/2008  . HTN (hypertension) 12/25/2008  . AAA (abdominal aortic aneurysm) without rupture ([place]) 12/25/2008  . CKD (chronic kidney disease) 12/25/2008  . HYPERCHOLESTEROLEMIA 03/01/2008  . GERD 03/01/2008  . ACNE ROSACEA 11/29/2007  . DIVERTICULOSIS, COLON 04/12/2007    Carey Bullocks, OTR/L 04/20/2017, 9:19 AM  [facility] [address]  [place], [place], [address] Phone: [phone]   Fax:  [phone]  Name: CHIEF WALKUP MRN: [id number] Date of Birth: December 19, 1927

## 2017-04-21 ENCOUNTER — Ambulatory Visit: Payer: Medicare Other | Admitting: Physical Therapy

## 2017-04-21 DIAGNOSIS — R262 Difficulty in walking, not elsewhere classified: Secondary | ICD-10-CM | POA: Diagnosis not present

## 2017-04-21 DIAGNOSIS — R293 Abnormal posture: Secondary | ICD-10-CM

## 2017-04-21 DIAGNOSIS — R2689 Other abnormalities of gait and mobility: Secondary | ICD-10-CM

## 2017-04-21 DIAGNOSIS — R2681 Unsteadiness on feet: Secondary | ICD-10-CM | POA: Diagnosis not present

## 2017-04-21 NOTE — Therapy (Signed)
[facility] [address]  [place], [place], [address] Phone: [phone]   Fax:  [phone]  Physical Therapy Treatment  Patient Details  Name: DOMINQUE MARLIN MRN: [id number] Date of Birth: [birth year] Referring Provider: Jessy Oto, MD  Encounter Date: 04/21/2017      PT End of Session - 04/21/17 1619    Visit Number 3   Number of Visits 16   Date for PT Re-Evaluation 06/10/17   Authorization Type Medicare / BCBS   PT Start Time 1619   PT Stop Time 1711   PT Time Calculation (min) 52 min   Activity Tolerance Patient tolerated treatment well   Behavior During Therapy [facility] for tasks assessed/performed      Past Medical History:  Diagnosis Date  . Abdominal aortic aneurysm ([place]) 09/2008   s/o endovascular repair and angiogram 10/04/08   . Anemia    EGD Cscope 08-2012  . Bleeding ulcer 1997  . CKD (chronic kidney disease), stage IV (HCC)    Stable, Dr. Dimas Aguas  . COPD (chronic obstructive pulmonary disease) ([place])   . H/O hyperkalemia    Stable  . Hypertension   . Leukopenia 2/11   thought to be from doxy  . Renal cell carcinoma    clear cell type dx 10/09, s/p excision 10-24-08  . Renal insufficiency   . Solitary left kidney    W/ chronic tubule-interstitial damage, Dr. Dimas Aguas  . Spinal stenosis    causing LE weakness-persistant, being worked up by Hydrographic surveyor and neurologist    Past Surgical History:  Procedure Laterality Date  . ABDOMINAL AORTIC ANEURYSM REPAIR  2009   EVAR  . [place]  . [place] ([place]), 2004  . NEPHRECTOMY  2009   Open nephrectomy for cancer    There were no vitals filed for this visit.      Subjective Assessment - 04/21/17 1625    Subjective Pt continues to deny pain but c/o heaviness/tired feeling in legs.   Patient Stated Goals "Walk good"   Currently in Pain? No/denies                               Balance Exercises - 04/21/17 1619      OTAGO PROGRAM   Head Movements Standing;5 reps   Neck Movements Standing;5 reps   Back Extension Standing;5 reps  back to counter   Trunk Movements Standing;5 reps   Ankle Movements Sitting;10 reps   Knee Extensor 10 reps   Knee Flexor 10 reps   Hip ABductor 10 reps   Ankle Plantorflexors 20 reps, support   Ankle Dorsiflexors 20 reps, support   Knee Bends 10 reps, support   Backwards Walking Support   Walking and Turning Around No assistive device   Sideways Walking Assistive device   Tandem Stance 10 seconds, support   Tandem Walk Support   One Leg Stand 10 seconds, support   Heel Walking Support   Toe Walk Support   Sit to Stand 10 reps, no support           PT Education - 04/21/17 1715    Education provided Yes   Education Details [employer]; discussion with pt's dtr [name]) re: options for home cardio equip - recommended restorator style bike   Person(s) Educated Patient   Methods Explanation;Demonstration;Handout;Verbal cues;Tactile cues   Comprehension  Verbalized understanding;Returned demonstration;Verbal cues required;Tactile cues required;Need further instruction          PT Short Term Goals - 04/18/17 1615      PT SHORT TERM GOAL #1   Title Assess dynamic gait stability per DGI or FGA by 04/22/17   Status Achieved     PT SHORT TERM GOAL #2   Title Independent with OTAGO Fall Prevention Program HEP at supported level by 05/13/17   Status On-going           PT Long Term Goals - 04/18/17 1616      PT LONG TERM GOAL #1   Title Pt will be independent with OTAGO fall prevention HEP at unsupported level or other advanced balance HEP by 06/10/17   Status On-going     PT LONG TERM GOAL #2   Title B LE strength to >/= 4/5 for improved function by 06/10/17   Status On-going     PT LONG TERM GOAL #3   Title Pt will demonstrate improvement on the Berg Balance Scale to 48/56 or greater to reduce  risk for falls by 06/10/17   Status On-going     PT LONG TERM GOAL #4   Title Pt will improve gait speed to 2.6 ft/sec or greater for improved safety with community ambulation by 06/10/17   Status On-going               Plan - 04/21/17 1716    Clinical Impression Statement Introduced [employer] Prevention program at supported level using countertop for UE support with pt requiring min to mod cues for most activities but no LOB observed. [place] booklet provided with recommended home activities highlighted in yellow. Informed pt's dtr of recommended home instructions as well as discussed options for home cardio equipment with suggestion of restorator for home cycling.   Rehab Potential Good   Clinical Impairments Affecting Rehab Potential Advanced age, Parkinson's disease, OA of mutiple joints, lumbar stenosis, LE neuropathy, HTN, COPD, CKD   PT Treatment/Interventions Patient/family education;Neuromuscular re-education;Balance training;Therapeutic exercise;Therapeutic activities;Functional mobility training;Gait training;Manual techniques;Taping;Dry needling;Electrical Stimulation;Moist Heat;Cryotherapy;ADLs/Self Care Home Management   PT Next Visit Plan Review stretches and postural re-education; [place] Fall Prevention Program progression as tolerated   Consulted and Agree with Plan of Care Patient;Family member/caregiver   Family Member Consulted Dtr - [name]      Patient will benefit from skilled therapeutic intervention in order to improve the following deficits and impairments:  Abnormal gait, Difficulty walking, Decreased activity tolerance, Decreased balance, Decreased coordination, Decreased safety awareness, Decreased strength, Impaired flexibility, Decreased range of motion, Postural dysfunction  Visit Diagnosis: Other abnormalities of gait and mobility  Difficulty in walking, not elsewhere classified  Unsteadiness on feet  Abnormal posture     Problem List Patient  Active Problem List   Diagnosis Date Noted  . Follow-up --- PCP NOTES 09/02/2015  . Neuropathy 06/10/2015  . Pedal edema 01/30/2015  . Hip pain, chronic 01/30/2015  . Back pain 01/30/2015  . Cramps of lower extremity 01/30/2015  . Aftercare following surgery of the circulatory system, [place] 08/05/2014  . Annual physical exam 10/19/2011  . DJD (degenerative joint disease) 12/14/2010  . Anemia 06/06/2009  . COPD (chronic obstructive pulmonary disease) ([place]) 06/06/2009  . RENAL CELL CANCER 12/25/2008  . HTN (hypertension) 12/25/2008  . AAA (abdominal aortic aneurysm) without rupture ([place]) 12/25/2008  . CKD (chronic kidney disease) 12/25/2008  . HYPERCHOLESTEROLEMIA 03/01/2008  . GERD 03/01/2008  . ACNE ROSACEA 11/29/2007  . DIVERTICULOSIS, COLON  04/12/2007    Percival Spanish, PT, MPT 04/21/2017, 5:30 PM  [facility] [address]  [place], [place], [address] Phone: [phone]   Fax:  [phone]  Name: JESHUA RANSFORD MRN: [id number] Date of Birth: [birth year]

## 2017-04-25 DIAGNOSIS — G629 Polyneuropathy, unspecified: Secondary | ICD-10-CM | POA: Diagnosis not present

## 2017-04-25 DIAGNOSIS — D61818 Other pancytopenia: Secondary | ICD-10-CM | POA: Diagnosis not present

## 2017-04-25 DIAGNOSIS — N281 Cyst of kidney, acquired: Secondary | ICD-10-CM | POA: Diagnosis not present

## 2017-04-25 DIAGNOSIS — N189 Chronic kidney disease, unspecified: Secondary | ICD-10-CM | POA: Diagnosis not present

## 2017-04-25 DIAGNOSIS — Z87891 Personal history of nicotine dependence: Secondary | ICD-10-CM | POA: Diagnosis not present

## 2017-04-25 DIAGNOSIS — D631 Anemia in chronic kidney disease: Secondary | ICD-10-CM | POA: Diagnosis not present

## 2017-04-25 DIAGNOSIS — Z79899 Other long term (current) drug therapy: Secondary | ICD-10-CM | POA: Diagnosis not present

## 2017-04-26 ENCOUNTER — Ambulatory Visit: Payer: Medicare Other | Attending: Surgery | Admitting: Physical Therapy

## 2017-04-26 DIAGNOSIS — R2681 Unsteadiness on feet: Secondary | ICD-10-CM | POA: Insufficient documentation

## 2017-04-26 DIAGNOSIS — R262 Difficulty in walking, not elsewhere classified: Secondary | ICD-10-CM | POA: Diagnosis not present

## 2017-04-26 DIAGNOSIS — R2689 Other abnormalities of gait and mobility: Secondary | ICD-10-CM

## 2017-04-26 DIAGNOSIS — R293 Abnormal posture: Secondary | ICD-10-CM | POA: Insufficient documentation

## 2017-04-26 NOTE — Therapy (Signed)
[facility] [address]  [place], [place], [address] Phone: [phone]   Fax:  [phone]  Physical Therapy Treatment  Patient Details  Name: [name] MRN: [id number] Date of Birth: [birth year] Referring Provider: Jessy Oto, MD  Encounter Date: 04/26/2017      PT End of Session - 04/26/17 1312    Visit Number 4   Number of Visits 16   Date for PT Re-Evaluation 06/10/17   Authorization Type Medicare / BCBS   PT Start Time 1312   PT Stop Time 1401   PT Time Calculation (min) 49 min   Activity Tolerance Patient tolerated treatment well   Behavior During Therapy [facility] for tasks assessed/performed      Past Medical History:  Diagnosis Date  . Abdominal aortic aneurysm ([place]) 09/2008   s/o endovascular repair and angiogram 10/04/08   . Anemia    EGD Cscope 08-2012  . Bleeding ulcer 1997  . CKD (chronic kidney disease), stage IV (HCC)    Stable, Dr. Dimas Aguas  . COPD (chronic obstructive pulmonary disease) ([place])   . H/O hyperkalemia    Stable  . Hypertension   . Leukopenia 2/11   thought to be from doxy  . Renal cell carcinoma    clear cell type dx 10/09, s/p excision 10-24-08  . Renal insufficiency   . Solitary left kidney    W/ chronic tubule-interstitial damage, Dr. Dimas Aguas  . Spinal stenosis    causing LE weakness-persistant, being worked up by Hydrographic surveyor and neurologist    Past Surgical History:  Procedure Laterality Date  . ABDOMINAL AORTIC ANEURYSM REPAIR  2009   EVAR  . [place]  . [place] ([place]), 2004  . NEPHRECTOMY  2009   Open nephrectomy for cancer    There were no vitals filed for this visit.      Subjective Assessment - 04/26/17 1319    Subjective Pt continues to deny pain but c/o heaviness/tired feeling in legs. Discussed restorator options with pt's dtr.   Patient Stated Goals "Walk good"   Currently in Pain? No/denies   Pain Score  0-No pain   Pain Location Leg   Pain Orientation Right;Left   Pain Descriptors / Indicators Heaviness;Tiring;Cramping   Pain Type Neuropathic pain                         [facility] Adult PT Treatment/Exercise - 04/26/17 1312      Self-Care   Self-Care Posture   Posture Postural re-education on wall/doorframe 10x10" - pt cued to roll back along doorframe or pool noodle on wall, extending arms out to sides while retracting scapulae     Knee/Hip Exercises: Aerobic   Recumbent Bike lvl 2 x 5'             Balance Exercises - 04/26/17 1312      OTAGO PROGRAM   Head Movements Standing;5 reps   Neck Movements Standing;5 reps   Back Extension Standing;5 reps   Trunk Movements Standing;5 reps   Ankle Movements Sitting;10 reps   Knee Extensor 10 reps;Weight (comment)  2#   Knee Flexor 10 reps;Weight (comment)  2#   Hip ABductor 10 reps;Weight (comment)  2#   Ankle Plantorflexors 20 reps, support   Ankle Dorsiflexors 20 reps, support   Knee Bends 10 reps, support   Backwards Walking Support   Walking and Turning  Around No assistive device  cones as targets   Sideways Walking Assistive device  countertop support   Tandem Stance 10 seconds, support   Tandem Walk Support   One Leg Stand 10 seconds, support   Heel Walking Support   Toe Walk Support   Sit to Stand 10 reps, no support             PT Short Term Goals - 04/18/17 1615      PT SHORT TERM GOAL #1   Title Assess dynamic gait stability per DGI or FGA by 04/22/17   Status Achieved     PT SHORT TERM GOAL #2   Title Independent with OTAGO Fall Prevention Program HEP at supported level by 05/13/17   Status On-going           PT Long Term Goals - 04/18/17 1616      PT LONG TERM GOAL #1   Title Pt will be independent with OTAGO fall prevention HEP at unsupported level or other advanced balance HEP by 06/10/17   Status On-going     PT LONG TERM GOAL #2   Title B LE strength to >/= 4/5 for  improved function by 06/10/17   Status On-going     PT LONG TERM GOAL #3   Title Pt will demonstrate improvement on the Berg Balance Scale to 48/56 or greater to reduce risk for falls by 06/10/17   Status On-going     PT LONG TERM GOAL #4   Title Pt will improve gait speed to 2.6 ft/sec or greater for improved safety with community ambulation by 06/10/17   Status On-going               Plan - 04/26/17 1405    Clinical Impression Statement Confirmed recommended style of LE restorator for pt use for LE cardio at home with pt's dtr, and dtr ordered one from [facility] for pt. Pt and dtr reporting family helping guide pt in [place] program at home - reviewed program and pt demosntrating limited recognition of exercises/activities, requiring mutiple cues for correct performance. Will allow pt and family to work on program at home for a week or two and revisit to confirm good understanding. Also introduced option of pool noodle on wall as alternative to doorframe for postural reeducation.   Rehab Potential Good   Clinical Impairments Affecting Rehab Potential Advanced age, Parkinson's disease, OA of mutiple joints, lumbar stenosis, LE neuropathy, HTN, COPD, CKD   PT Treatment/Interventions Patient/family education;Neuromuscular re-education;Balance training;Therapeutic exercise;Therapeutic activities;Functional mobility training;Gait training;Manual techniques;Taping;Dry needling;Electrical Stimulation;Moist Heat;Cryotherapy;ADLs/Self Care Home Management   PT Next Visit Plan Review stretches and postural re-education; review/progress [place] Fall Prevention Program progression as tolerated   Consulted and Agree with Plan of Care Patient;Family member/caregiver   Family Member Consulted Dtr - [name]      Patient will benefit from skilled therapeutic intervention in order to improve the following deficits and impairments:  Abnormal gait, Difficulty walking, Decreased activity tolerance,  Decreased balance, Decreased coordination, Decreased safety awareness, Decreased strength, Impaired flexibility, Decreased range of motion, Postural dysfunction  Visit Diagnosis: Other abnormalities of gait and mobility  Difficulty in walking, not elsewhere classified  Unsteadiness on feet  Abnormal posture     Problem List Patient Active Problem List   Diagnosis Date Noted  . Follow-up --- PCP NOTES 09/02/2015  . Neuropathy 06/10/2015  . Pedal edema 01/30/2015  . Hip pain, chronic 01/30/2015  . Back pain 01/30/2015  . Cramps of lower  extremity 01/30/2015  . Aftercare following surgery of the circulatory system, [place] 08/05/2014  . Annual physical exam 10/19/2011  . DJD (degenerative joint disease) 12/14/2010  . Anemia 06/06/2009  . COPD (chronic obstructive pulmonary disease) ([place]) 06/06/2009  . RENAL CELL CANCER 12/25/2008  . HTN (hypertension) 12/25/2008  . AAA (abdominal aortic aneurysm) without rupture ([place]) 12/25/2008  . CKD (chronic kidney disease) 12/25/2008  . HYPERCHOLESTEROLEMIA 03/01/2008  . GERD 03/01/2008  . ACNE ROSACEA 11/29/2007  . DIVERTICULOSIS, COLON 04/12/2007    Percival Spanish, PT, MPT 04/26/2017, 2:14 PM  [facility] [address]  [place], [place], [address] Phone: [phone]   Fax:  [phone]  Name: [name] MRN: [id number] Date of Birth: 1927/05/19

## 2017-04-28 ENCOUNTER — Ambulatory Visit: Payer: Medicare Other | Admitting: Physical Therapy

## 2017-04-28 DIAGNOSIS — R293 Abnormal posture: Secondary | ICD-10-CM

## 2017-04-28 DIAGNOSIS — R2689 Other abnormalities of gait and mobility: Secondary | ICD-10-CM

## 2017-04-28 DIAGNOSIS — R2681 Unsteadiness on feet: Secondary | ICD-10-CM

## 2017-04-28 DIAGNOSIS — R262 Difficulty in walking, not elsewhere classified: Secondary | ICD-10-CM | POA: Diagnosis not present

## 2017-04-28 NOTE — Therapy (Signed)
[facility] [address]  [place], [place], [address] Phone: [phone]   Fax:  [phone]  Physical Therapy Treatment  Patient Details  Name: [name] MRN: [id number] Date of Birth: 01-28-1927 Referring Provider: Jessy Oto, MD  Encounter Date: 04/28/2017      PT End of Session - 04/28/17 1511    Visit Number 5   Number of Visits 16   Date for PT Re-Evaluation 06/10/17   Authorization Type Medicare / BCBS   PT Start Time 8144   PT Stop Time 1534   PT Time Calculation (min) 45 min   Activity Tolerance Patient tolerated treatment well   Behavior During Therapy [facility] for tasks assessed/performed      Past Medical History:  Diagnosis Date  . Abdominal aortic aneurysm ([place]) 09/2008   s/o endovascular repair and angiogram 10/04/08   . Anemia    EGD Cscope 08-2012  . Bleeding ulcer 1997  . CKD (chronic kidney disease), stage IV (HCC)    Stable, Dr. Dimas Aguas  . COPD (chronic obstructive pulmonary disease) ([place])   . H/O hyperkalemia    Stable  . Hypertension   . Leukopenia 2/11   thought to be from doxy  . Renal cell carcinoma    clear cell type dx 10/09, s/p excision 10-24-08  . Renal insufficiency   . Solitary left kidney    W/ chronic tubule-interstitial damage, Dr. Dimas Aguas  . Spinal stenosis    causing LE weakness-persistant, being worked up by Hydrographic surveyor and neurologist    Past Surgical History:  Procedure Laterality Date  . ABDOMINAL AORTIC ANEURYSM REPAIR  2009   EVAR  . [place]  . [place] ([place]), 2004  . NEPHRECTOMY  2009   Open nephrectomy for cancer    There were no vitals filed for this visit.      Subjective Assessment - 04/28/17 1458    Subjective Pt noting some cramps recently in addition to usual heavy/tired feeling, but no pain today.   Patient Stated Goals "Walk good"   Currently in Pain? No/denies                          [facility] Adult PT Treatment/Exercise - 04/28/17 1449      Exercises   Exercises Lumbar     Lumbar Exercises: Standing   Row Both;10 reps;Theraband   Theraband Level (Row) Level 3 (Green)   Row Limitations verbal & tactile cues for upright posture & scap retraction   Other Standing Lumbar Exercises Trunk extension into pool noodle on wall + yellow TB horiz ABD/scap squeeze & alt shoulder flex/ext with yellow TB 10x3" each     Lumbar Exercises: Seated   Sit to Stand 10 reps  2 sets   Sit to Stand Limitations + contralateral reach to tap cone   Other Seated Lumbar Exercises Green TB row with emphasis on upright posture & scap retraction 15x3"     Lumbar Exercises: Quadruped   Single Arm Raise Right;Left  1 rep each   Single Arm Raises Limitations torso supported on peanut ball   Straight Leg Raise 5 reps;2 seconds   Straight Leg Raises Limitations torso supported on peanut ball     Knee/Hip Exercises: Aerobic   Recumbent Bike lvl 1-2 x 6'                PT  Education - 04/28/17 1554    Education provided Yes   Education Details scapular retratction in horiz abduction & alternating shoulder flex/ext with yellow TB standing against pool noodle added to HEP   Person(s) Educated Patient;Child(ren)   Methods Explanation;Demonstration;Verbal cues;Handout   Comprehension Verbalized understanding;Returned demonstration;Verbal cues required;Need further instruction          PT Short Term Goals - 04/18/17 1615      PT SHORT TERM GOAL #1   Title Assess dynamic gait stability per DGI or FGA by 04/22/17   Status Achieved     PT SHORT TERM GOAL #2   Title Independent with OTAGO Fall Prevention Program HEP at supported level by 05/13/17   Status On-going           PT Long Term Goals - 04/18/17 1616      PT LONG TERM GOAL #1   Title Pt will be independent with OTAGO fall prevention HEP at unsupported level or other advanced balance HEP  by 06/10/17   Status On-going     PT LONG TERM GOAL #2   Title B LE strength to >/= 4/5 for improved function by 06/10/17   Status On-going     PT LONG TERM GOAL #3   Title Pt will demonstrate improvement on the Berg Balance Scale to 48/56 or greater to reduce risk for falls by 06/10/17   Status On-going     PT LONG TERM GOAL #4   Title Pt will improve gait speed to 2.6 ft/sec or greater for improved safety with community ambulation by 06/10/17   Status On-going               Plan - 04/28/17 1534    Clinical Impression Statement Pt reporting his dtr purchased a pool noodle for his postural extension exercises on wall therefore reviewed this exercise and added scapular retratction in horiz abduction as well as alternating shoulder flex/ext with yellow TB while standing upright against noodle with pt demostrating good technique, therefore added to HEP. Continued trunxk extension strengthening focus with quadruped hip extension and rows but pt requiring more cues/assistance with these activities.   Rehab Potential Good   Clinical Impairments Affecting Rehab Potential Advanced age, Parkinson's disease, OA of mutiple joints, lumbar stenosis, LE neuropathy, HTN, COPD, CKD   PT Treatment/Interventions Patient/family education;Neuromuscular re-education;Balance training;Therapeutic exercise;Therapeutic activities;Functional mobility training;Gait training;Manual techniques;Taping;Dry needling;Electrical Stimulation;Moist Heat;Cryotherapy;ADLs/Self Care Home Management   PT Next Visit Plan Review stretches and postural re-education; review/progress [place] Fall Prevention Program progression as tolerated   Consulted and Agree with Plan of Care Patient;Family member/caregiver   Family Member Consulted Dtr - [name]      Patient will benefit from skilled therapeutic intervention in order to improve the following deficits and impairments:  Abnormal gait, Difficulty walking, Decreased activity  tolerance, Decreased balance, Decreased coordination, Decreased safety awareness, Decreased strength, Impaired flexibility, Decreased range of motion, Postural dysfunction  Visit Diagnosis: Other abnormalities of gait and mobility  Difficulty in walking, not elsewhere classified  Unsteadiness on feet  Abnormal posture     Problem List Patient Active Problem List   Diagnosis Date Noted  . Follow-up --- PCP NOTES 09/02/2015  . Neuropathy 06/10/2015  . Pedal edema 01/30/2015  . Hip pain, chronic 01/30/2015  . Back pain 01/30/2015  . Cramps of lower extremity 01/30/2015  . Aftercare following surgery of the circulatory system, [place] 08/05/2014  . Annual physical exam 10/19/2011  . DJD (degenerative joint disease) 12/14/2010  . Anemia 06/06/2009  .  COPD (chronic obstructive pulmonary disease) ([place]) 06/06/2009  . RENAL CELL CANCER 12/25/2008  . HTN (hypertension) 12/25/2008  . AAA (abdominal aortic aneurysm) without rupture ([place]) 12/25/2008  . CKD (chronic kidney disease) 12/25/2008  . HYPERCHOLESTEROLEMIA 03/01/2008  . GERD 03/01/2008  . ACNE ROSACEA 11/29/2007  . DIVERTICULOSIS, COLON 04/12/2007    Percival Spanish, PT, MPT 04/28/2017, 3:59 PM  [facility] [address]  [place], [place], [address] Phone: [phone]   Fax:  [phone]  Name: [name] MRN: [id number] Date of Birth: 1927-07-29

## 2017-05-03 ENCOUNTER — Ambulatory Visit: Payer: Medicare Other | Admitting: Physical Therapy

## 2017-05-03 DIAGNOSIS — R2681 Unsteadiness on feet: Secondary | ICD-10-CM | POA: Diagnosis not present

## 2017-05-03 DIAGNOSIS — R293 Abnormal posture: Secondary | ICD-10-CM | POA: Diagnosis not present

## 2017-05-03 DIAGNOSIS — R262 Difficulty in walking, not elsewhere classified: Secondary | ICD-10-CM | POA: Diagnosis not present

## 2017-05-03 DIAGNOSIS — R2689 Other abnormalities of gait and mobility: Secondary | ICD-10-CM

## 2017-05-03 NOTE — Therapy (Signed)
[facility] [address]  [place], [place], [address] Phone: [phone]   Fax:  [phone]  Physical Therapy Treatment  Patient Details  Name: [name] MRN: [id number] Date of Birth: 1927-04-09 Referring Provider: Jessy Oto, MD  Encounter Date: 05/03/2017      PT End of Session - 05/03/17 1402    Visit Number 6   Number of Visits 16   Date for PT Re-Evaluation 06/10/17   Authorization Type Medicare / BCBS   PT Start Time 1402   PT Stop Time 1443   PT Time Calculation (min) 41 min   Activity Tolerance Patient tolerated treatment well   Behavior During Therapy [facility] for tasks assessed/performed      Past Medical History:  Diagnosis Date  . Abdominal aortic aneurysm ([place]) 09/2008   s/o endovascular repair and angiogram 10/04/08   . Anemia    EGD Cscope 08-2012  . Bleeding ulcer 1997  . CKD (chronic kidney disease), stage IV (HCC)    Stable, Dr. Dimas Aguas  . COPD (chronic obstructive pulmonary disease) ([place])   . H/O hyperkalemia    Stable  . Hypertension   . Leukopenia 2/11   thought to be from doxy  . Renal cell carcinoma    clear cell type dx 10/09, s/p excision 10-24-08  . Renal insufficiency   . Solitary left kidney    W/ chronic tubule-interstitial damage, Dr. Dimas Aguas  . Spinal stenosis    causing LE weakness-persistant, being worked up by Hydrographic surveyor and neurologist    Past Surgical History:  Procedure Laterality Date  . ABDOMINAL AORTIC ANEURYSM REPAIR  2009   EVAR  . [place]  . [place]), 2004  . NEPHRECTOMY  2009   Open nephrectomy for cancer    There were no vitals filed for this visit.      Subjective Assessment - 05/03/17 1409    Subjective Pt's dtr reports he has recieved his home restorator bike and pt reports he has been using a couple times a day for up to 20 minutes at a time.   Patient Stated Goals "Walk good"   Currently  in Pain? No/denies                         [facility] Adult PT Treatment/Exercise - 05/03/17 1402      Lumbar Exercises: Standing   Other Standing Lumbar Exercises Trunk extension into pool noodle on wall + yellow TB horiz ABD/scap squeeze & alt shoulder flex/ext with yellow TB 10x3" each     Knee/Hip Exercises: Aerobic   Nustep lvl 5 x 6'             Balance Exercises - 05/03/17 1402      Balance Exercises: Standing   SLS Eyes open;Foam/compliant surface;Upper extremity support 2;2 reps;10 secs   SLS with Vectors Foam/compliant surface;Upper extremity assist 1;5 reps  reach to 3 balance bubbles   Step Ups Forward;2 inch;Intermittent UE support  Airex foam   Partial Tandem Stance Eyes open;Foam/compliant surface;Intermittent upper extremity support  ant/post weight shift   Marching Limitations Marching in place on Airex pad x10, 2 pole A   Other Standing Exercises SLS cone knock down & righting             PT Short Term Goals - 04/18/17 1615      PT SHORT TERM GOAL #  1   Title Assess dynamic gait stability per DGI or FGA by 04/22/17   Status Achieved     PT SHORT TERM GOAL #2   Title Independent with OTAGO Fall Prevention Program HEP at supported level by 05/13/17   Status On-going           PT Long Term Goals - 04/18/17 1616      PT LONG TERM GOAL #1   Title Pt will be independent with OTAGO fall prevention HEP at unsupported level or other advanced balance HEP by 06/10/17   Status On-going     PT LONG TERM GOAL #2   Title B LE strength to >/= 4/5 for improved function by 06/10/17   Status On-going     PT LONG TERM GOAL #3   Title Pt will demonstrate improvement on the Berg Balance Scale to 48/56 or greater to reduce risk for falls by 06/10/17   Status On-going     PT LONG TERM GOAL #4   Title Pt will improve gait speed to 2.6 ft/sec or greater for improved safety with community ambulation by 06/10/17   Status On-going                Plan - 05/03/17 1411    Clinical Impression Statement Pt reporting using new home restorator mutiple times per day. Reviewed latest HEP addition emphasizing scapular retraction into pool noodle for upright posture. Introduced dynamic balance activities standing on firm surface and foam/compliant surface targeting SLS and weight shift with pt requiring SBA/CGA for most activities.   Rehab Potential Good   Clinical Impairments Affecting Rehab Potential Advanced age, Parkinson's disease, OA of mutiple joints, lumbar stenosis, LE neuropathy, HTN, COPD, CKD   PT Treatment/Interventions Patient/family education;Neuromuscular re-education;Balance training;Therapeutic exercise;Therapeutic activities;Functional mobility training;Gait training;Manual techniques;Taping;Dry needling;Electrical Stimulation;Moist Heat;Cryotherapy;ADLs/Self Care Home Management   PT Next Visit Plan Review stretches and postural re-education; review/progress [place] Fall Prevention Program progression as tolerated   Consulted and Agree with Plan of Care Patient;Family member/caregiver   Family Member Consulted Dtr - [name]      Patient will benefit from skilled therapeutic intervention in order to improve the following deficits and impairments:  Abnormal gait, Difficulty walking, Decreased activity tolerance, Decreased balance, Decreased coordination, Decreased safety awareness, Decreased strength, Impaired flexibility, Decreased range of motion, Postural dysfunction  Visit Diagnosis: Other abnormalities of gait and mobility  Difficulty in walking, not elsewhere classified  Unsteadiness on feet     Problem List Patient Active Problem List   Diagnosis Date Noted  . Follow-up --- PCP NOTES 09/02/2015  . Neuropathy 06/10/2015  . Pedal edema 01/30/2015  . Hip pain, chronic 01/30/2015  . Back pain 01/30/2015  . Cramps of lower extremity 01/30/2015  . Aftercare following surgery of the circulatory system, [place] 08/05/2014  .  Annual physical exam 10/19/2011  . DJD (degenerative joint disease) 12/14/2010  . Anemia 06/06/2009  . COPD (chronic obstructive pulmonary disease) ([place]) 06/06/2009  . RENAL CELL CANCER 12/25/2008  . HTN (hypertension) 12/25/2008  . AAA (abdominal aortic aneurysm) without rupture ([employer]) 12/25/2008  . CKD (chronic kidney disease) 12/25/2008  . HYPERCHOLESTEROLEMIA 03/01/2008  . GERD 03/01/2008  . ACNE ROSACEA 11/29/2007  . DIVERTICULOSIS, COLON 04/12/2007    Percival Spanish, PT, MPT 05/03/2017, 3:38 PM  [facility] [address]  [place], [place], [address] Phone: [phone]   Fax:  [phone]  Name: TAJAH SCHREINER MRN: [id number] Date of Birth: [birth year]

## 2017-05-05 ENCOUNTER — Ambulatory Visit: Payer: Medicare Other | Admitting: Physical Therapy

## 2017-05-05 DIAGNOSIS — N281 Cyst of kidney, acquired: Secondary | ICD-10-CM | POA: Diagnosis not present

## 2017-05-05 DIAGNOSIS — R2681 Unsteadiness on feet: Secondary | ICD-10-CM | POA: Diagnosis not present

## 2017-05-05 DIAGNOSIS — R262 Difficulty in walking, not elsewhere classified: Secondary | ICD-10-CM | POA: Diagnosis not present

## 2017-05-05 DIAGNOSIS — R2689 Other abnormalities of gait and mobility: Secondary | ICD-10-CM

## 2017-05-05 DIAGNOSIS — R293 Abnormal posture: Secondary | ICD-10-CM | POA: Diagnosis not present

## 2017-05-05 DIAGNOSIS — D61818 Other pancytopenia: Secondary | ICD-10-CM | POA: Diagnosis not present

## 2017-05-05 NOTE — Therapy (Signed)
[facility] [address]  [place], [place], [address] Phone: [phone]   Fax:  [phone]  Physical Therapy Treatment  Patient Details  Name: [name] MRN: [id number] Date of Birth: March 02, 1927 Referring Provider: Jessy Oto, MD  Encounter Date: 05/05/2017      PT End of Session - 05/05/17 0758    Visit Number 7   Number of Visits 16   Date for PT Re-Evaluation 06/10/17   Authorization Type Medicare / BCBS   PT Start Time 0758   PT Stop Time 0844   PT Time Calculation (min) 46 min   Activity Tolerance Patient tolerated treatment well   Behavior During Therapy [facility] for tasks assessed/performed      Past Medical History:  Diagnosis Date  . Abdominal aortic aneurysm ([place]) 09/2008   s/o endovascular repair and angiogram 10/04/08   . Anemia    EGD Cscope 08-2012  . Bleeding ulcer 1997  . CKD (chronic kidney disease), stage IV (HCC)    Stable, Dr. Dimas Aguas  . COPD (chronic obstructive pulmonary disease) ([place])   . H/O hyperkalemia    Stable  . Hypertension   . Leukopenia 2/11   thought to be from doxy  . Renal cell carcinoma    clear cell type dx 10/09, s/p excision 10-24-08  . Renal insufficiency   . Solitary left kidney    W/ chronic tubule-interstitial damage, Dr. Dimas Aguas  . Spinal stenosis    causing LE weakness-persistant, being worked up by Hydrographic surveyor and neurologist    Past Surgical History:  Procedure Laterality Date  . ABDOMINAL AORTIC ANEURYSM REPAIR  2009   EVAR  . [place]  . [place] ([place]), 2004  . NEPHRECTOMY  2009   Open nephrectomy for cancer    There were no vitals filed for this visit.      Subjective Assessment - 05/05/17 0805    Subjective "i've been better & I've been worser."   Patient Stated Goals "Walk good"   Currently in Pain? No/denies                         [facility] Adult PT Treatment/Exercise -  05/05/17 0758      Knee/Hip Exercises: Aerobic   Nustep lvl 5 x 7'             Balance Exercises - 05/05/17 0758      OTAGO PROGRAM   Head Movements Standing;5 reps   Neck Movements Standing;5 reps   Back Extension Standing;5 reps   Trunk Movements Standing;5 reps   Ankle Movements Sitting;10 reps   Knee Extensor 10 reps;Weight (comment)  3#   Knee Flexor 10 reps;Weight (comment)  3#   Hip ABductor 10 reps;Weight (comment)  3#   Ankle Plantorflexors 20 reps, support   Ankle Dorsiflexors 20 reps, support   Knee Bends 20 reps, support   Backwards Walking Support   Walking and Turning Around No assistive device  cones as targets   Sideways Walking No assistive device   Tandem Stance --  20 sec, support   Tandem Walk Support  2nd rep- no support - CGA/SBA of PT   One Leg Stand --  20 sec, support   Heel Walking Support  intermittent support   Toe Walk Support  intermittent support   Heel Toe Walking Backward --  Level C = Support  Sit to Stand 10 reps, no support             PT Short Term Goals - 05/05/17 0844      PT SHORT TERM GOAL #1   Title Assess dynamic gait stability per DGI or FGA by 04/22/17   Status Achieved     PT SHORT TERM GOAL #2   Title Independent with OTAGO Fall Prevention Program HEP at supported level by 05/13/17   Status Achieved           PT Long Term Goals - 04/18/17 1616      PT LONG TERM GOAL #1   Title Pt will be independent with OTAGO fall prevention HEP at unsupported level or other advanced balance HEP by 06/10/17   Status On-going     PT LONG TERM GOAL #2   Title B LE strength to >/= 4/5 for improved function by 06/10/17   Status On-going     PT LONG TERM GOAL #3   Title Pt will demonstrate improvement on the Berg Balance Scale to 48/56 or greater to reduce risk for falls by 06/10/17   Status On-going     PT LONG TERM GOAL #4   Title Pt will improve gait speed to 2.6 ft/sec or greater for improved safety with  community ambulation by 06/10/17   Status On-going               Plan - 05/05/17 0806    Clinical Impression Statement [place] program reviewed with pt pt able to demonstrate all activities with only minor corrections. Pt able to tolerate progression of reps, resistance, and decreasing need for UE support. [place] STG met. Dicsussed progress with pt's dtr, [name], and indicated plan to continue to self-progress reps & resistance; while therapy sessions wil continue to focus on balance and dynamic gait activities with incorporation of PWR! Moves as appropriate.   Rehab Potential Good   Clinical Impairments Affecting Rehab Potential Advanced age, Parkinson's disease, OA of mutiple joints, lumbar stenosis, LE neuropathy, HTN, COPD, CKD   PT Treatment/Interventions Patient/family education;Neuromuscular re-education;Balance training;Therapeutic exercise;Therapeutic activities;Functional mobility training;Gait training;Manual techniques;Taping;Dry needling;Electrical Stimulation;Moist Heat;Cryotherapy;ADLs/Self Care Home Management   PT Next Visit Plan Review stretches and postural re-education; review/progress [place] Fall Prevention Program progression as tolerated; balance & dynamic gait activities: PWR! Moves as appropriate   Consulted and Agree with Plan of Care Patient;Family member/caregiver   Family Member Consulted Dtr - [name]      Patient will benefit from skilled therapeutic intervention in order to improve the following deficits and impairments:  Abnormal gait, Difficulty walking, Decreased activity tolerance, Decreased balance, Decreased coordination, Decreased safety awareness, Decreased strength, Impaired flexibility, Decreased range of motion, Postural dysfunction  Visit Diagnosis: Other abnormalities of gait and mobility  Difficulty in walking, not elsewhere classified  Unsteadiness on feet     Problem List Patient Active Problem List   Diagnosis Date Noted  . Follow-up ---  PCP NOTES 09/02/2015  . Neuropathy 06/10/2015  . Pedal edema 01/30/2015  . Hip pain, chronic 01/30/2015  . Back pain 01/30/2015  . Cramps of lower extremity 01/30/2015  . Aftercare following surgery of the circulatory system, [place] 08/05/2014  . Annual physical exam 10/19/2011  . DJD (degenerative joint disease) 12/14/2010  . Anemia 06/06/2009  . COPD (chronic obstructive pulmonary disease) ([place]) 06/06/2009  . RENAL CELL CANCER 12/25/2008  . HTN (hypertension) 12/25/2008  . AAA (abdominal aortic aneurysm) without rupture ([place]) 12/25/2008  . CKD (chronic kidney disease) 12/25/2008  .  HYPERCHOLESTEROLEMIA 03/01/2008  . GERD 03/01/2008  . ACNE ROSACEA 11/29/2007  . DIVERTICULOSIS, COLON 04/12/2007    Percival Spanish, PT, MPT 05/05/2017, 10:03 AM  [facility] [address]  [place], [place], [address] Phone: [phone]   Fax:  [phone]  Name: [name] MRN: [id number] Date of Birth: 12/13/1927

## 2017-05-09 ENCOUNTER — Ambulatory Visit: Payer: Medicare Other | Admitting: Physical Therapy

## 2017-05-09 ENCOUNTER — Telehealth: Payer: Self-pay | Admitting: *Deleted

## 2017-05-09 DIAGNOSIS — R262 Difficulty in walking, not elsewhere classified: Secondary | ICD-10-CM | POA: Diagnosis not present

## 2017-05-09 DIAGNOSIS — R293 Abnormal posture: Secondary | ICD-10-CM | POA: Diagnosis not present

## 2017-05-09 DIAGNOSIS — R2681 Unsteadiness on feet: Secondary | ICD-10-CM | POA: Diagnosis not present

## 2017-05-09 DIAGNOSIS — R2689 Other abnormalities of gait and mobility: Secondary | ICD-10-CM | POA: Diagnosis not present

## 2017-05-09 NOTE — Telephone Encounter (Signed)
[name] declines for pt at this time.

## 2017-05-09 NOTE — Therapy (Signed)
[facility] [address]  [place], [place], [address] Phone: [phone]   Fax:  [phone]  Physical Therapy Treatment  Patient Details  Name: CLETIS CLACK MRN: [id number] Date of Birth: 10/01/1927 Referring Provider: Jessy Oto, MD  Encounter Date: 05/09/2017      PT End of Session - 05/09/17 1532    Visit Number 8   Number of Visits 16   Date for PT Re-Evaluation 06/10/17   Authorization Type Medicare / BCBS   PT Start Time 1532   PT Stop Time 1614   PT Time Calculation (min) 42 min   Activity Tolerance Patient tolerated treatment well   Behavior During Therapy [facility] for tasks assessed/performed      Past Medical History:  Diagnosis Date  . Abdominal aortic aneurysm ([place]) 09/2008   s/o endovascular repair and angiogram 10/04/08   . Anemia    EGD Cscope 08-2012  . Bleeding ulcer 1997  . CKD (chronic kidney disease), stage IV (HCC)    Stable, Dr. Dimas Aguas  . COPD (chronic obstructive pulmonary disease) ([place])   . H/O hyperkalemia    Stable  . Hypertension   . Leukopenia 2/11   thought to be from doxy  . Renal cell carcinoma    clear cell type dx 10/09, s/p excision 10-24-08  . Renal insufficiency   . Solitary left kidney    W/ chronic tubule-interstitial damage, Dr. Dimas Aguas  . Spinal stenosis    causing LE weakness-persistant, being worked up by Hydrographic surveyor and neurologist    Past Surgical History:  Procedure Laterality Date  . ABDOMINAL AORTIC ANEURYSM REPAIR  2009   EVAR  . [place]  . [place] ([place]), 2004  . NEPHRECTOMY  2009   Open nephrectomy for cancer    There were no vitals filed for this visit.      Subjective Assessment - 05/09/17 1532    Subjective Pt c/o tight & tired across hi low back and the back of his hips.   Patient Stated Goals "Walk good"   Currently in Pain? Yes   Pain Score 6    Pain Location Back   Pain Orientation  Right;Left;Lower   Pain Descriptors / Indicators Tiring;Heaviness;Cramping   Pain Type Neuropathic pain   Pain Radiating Towards down back of legs to feet   Pain Onset More than a month ago   Pain Frequency Intermittent                         [facility] Adult PT Treatment/Exercise - 05/09/17 1532      Ambulation/Gait   Ambulation/Gait Assistance 5: Supervision   Ambulation/Gait Assistance Details cues for upright posture, improved hip and knee flexion for increased foot clearance and increased step length with exagerated reciprocal arm swing   Assistive device None   Gait Pattern Step-through pattern;Decreased arm swing - right;Decreased arm swing - left;Decreased stride length;Trunk flexed;Right foot flat;Left foot flat   Ambulation Surface Level;Indoor     Lumbar Exercises: Stretches   Single Knee to Chest Stretch 30 seconds;2 reps   Single Knee to Chest Stretch Limitations with opp LE straight for slight hip flexor stretch   Piriformis Stretch 30 seconds;2 reps   Piriformis Stretch Limitations KTOS     Lumbar Exercises: Standing   Other Standing Lumbar Exercises Upper trunk extension over green Pball on wall 10x3"  Lumbar Exercises: Supine   Bridge 15 reps;3 seconds     Knee/Hip Exercises: Aerobic   Nustep lvl 5 x 7'           PWR [facility]) - 05/09/17 1532    PWR! exercises Moves in supine;Functional moves   PWR! Up x10   PWR! Rock x10   PWR! Twist x10   PWR! Step x10   PWR! Sit to Stand x10               PT Short Term Goals - 05/05/17 0844      PT SHORT TERM GOAL #1   Title Assess dynamic gait stability per DGI or FGA by 04/22/17   Status Achieved     PT SHORT TERM GOAL #2   Title Independent with OTAGO Fall Prevention Program HEP at supported level by 05/13/17   Status Achieved           PT Long Term Goals - 04/18/17 1616      PT LONG TERM GOAL #1   Title Pt will be independent with OTAGO fall prevention HEP at unsupported level  or other advanced balance HEP by 06/10/17   Status On-going     PT LONG TERM GOAL #2   Title B LE strength to >/= 4/5 for improved function by 06/10/17   Status On-going     PT LONG TERM GOAL #3   Title Pt will demonstrate improvement on the Berg Balance Scale to 48/56 or greater to reduce risk for falls by 06/10/17   Status On-going     PT LONG TERM GOAL #4   Title Pt will improve gait speed to 2.6 ft/sec or greater for improved safety with community ambulation by 06/10/17   Status On-going               Plan - 05/09/17 1532    Clinical Impression Statement Pt reporting increased back and LE tightness and tiredness today, therefore reviewed stretches and focused on core strengthening incorporating supine PWR! Moves to promote increased extension posture, improved weight shift and trunk mobility with pt noting feeling better by end of session. Completed therapy session working briefly on normalizing gait pattern, emphasizing posture and larger movment patterns; pt dtr impressed with difference in his walking pattern.   Rehab Potential Good   Clinical Impairments Affecting Rehab Potential Advanced age, Parkinson's disease, OA of mutiple joints, lumbar stenosis, LE neuropathy, HTN, COPD, CKD   PT Treatment/Interventions Patient/family education;Neuromuscular re-education;Balance training;Therapeutic exercise;Therapeutic activities;Functional mobility training;Gait training;Manual techniques;Taping;Dry needling;Electrical Stimulation;Moist Heat;Cryotherapy;ADLs/Self Care Home Management   PT Next Visit Plan Review stretches and postural re-education; review/progress [place] Fall Prevention Program progression as tolerated; balance & dynamic gait activities: PWR! Moves as appropriate   Consulted and Agree with Plan of Care Patient;Family member/caregiver   Family Member Consulted Dtr - [name]      Patient will benefit from skilled therapeutic intervention in order to improve the following  deficits and impairments:  Abnormal gait, Difficulty walking, Decreased activity tolerance, Decreased balance, Decreased coordination, Decreased safety awareness, Decreased strength, Impaired flexibility, Decreased range of motion, Postural dysfunction  Visit Diagnosis: Other abnormalities of gait and mobility  Difficulty in walking, not elsewhere classified  Unsteadiness on feet     Problem List Patient Active Problem List   Diagnosis Date Noted  . Follow-up --- PCP NOTES 09/02/2015  . Neuropathy 06/10/2015  . Pedal edema 01/30/2015  . Hip pain, chronic 01/30/2015  . Back pain 01/30/2015  . Cramps of lower extremity  01/30/2015  . Aftercare following surgery of the circulatory system, [place] 08/05/2014  . Annual physical exam 10/19/2011  . DJD (degenerative joint disease) 12/14/2010  . Anemia 06/06/2009  . COPD (chronic obstructive pulmonary disease) ([place]) 06/06/2009  . RENAL CELL CANCER 12/25/2008  . HTN (hypertension) 12/25/2008  . AAA (abdominal aortic aneurysm) without rupture ([place]) 12/25/2008  . CKD (chronic kidney disease) 12/25/2008  . HYPERCHOLESTEROLEMIA 03/01/2008  . GERD 03/01/2008  . ACNE ROSACEA 11/29/2007  . DIVERTICULOSIS, COLON 04/12/2007    Percival Spanish, PT, MPT 05/09/2017, 6:26 PM  [facility] [address]  [place], [place], [address] Phone: [phone]   Fax:  [phone]  Name: SHJON LIZARRAGA MRN: [id number] Date of Birth: 27-Oct-1927

## 2017-05-12 ENCOUNTER — Ambulatory Visit (INDEPENDENT_AMBULATORY_CARE_PROVIDER_SITE_OTHER): Payer: Medicare Other | Admitting: Internal Medicine

## 2017-05-12 ENCOUNTER — Encounter: Payer: Self-pay | Admitting: Internal Medicine

## 2017-05-12 ENCOUNTER — Ambulatory Visit: Payer: Medicare Other | Admitting: Physical Therapy

## 2017-05-12 VITALS — BP 120/60 | HR 59 | Temp 97.8°F | Ht 69.0 in | Wt 160.2 lb

## 2017-05-12 DIAGNOSIS — R262 Difficulty in walking, not elsewhere classified: Secondary | ICD-10-CM

## 2017-05-12 DIAGNOSIS — R2681 Unsteadiness on feet: Secondary | ICD-10-CM

## 2017-05-12 DIAGNOSIS — N189 Chronic kidney disease, unspecified: Secondary | ICD-10-CM

## 2017-05-12 DIAGNOSIS — I10 Essential (primary) hypertension: Secondary | ICD-10-CM | POA: Diagnosis not present

## 2017-05-12 DIAGNOSIS — R2689 Other abnormalities of gait and mobility: Secondary | ICD-10-CM

## 2017-05-12 DIAGNOSIS — Z0000 Encounter for general adult medical examination without abnormal findings: Secondary | ICD-10-CM

## 2017-05-12 DIAGNOSIS — E785 Hyperlipidemia, unspecified: Secondary | ICD-10-CM

## 2017-05-12 DIAGNOSIS — R293 Abnormal posture: Secondary | ICD-10-CM | POA: Diagnosis not present

## 2017-05-12 DIAGNOSIS — G629 Polyneuropathy, unspecified: Secondary | ICD-10-CM | POA: Diagnosis not present

## 2017-05-12 LAB — LIPID PANEL
CHOLESTEROL: 188 mg/dL (ref 0–200)
HDL: 50.6 mg/dL (ref >39.00)
LDL Cholesterol: 122 mg/dL — ABNORMAL HIGH (ref 0–99)
NONHDL: 137.15
TRIGLYCERIDES: 78 mg/dL (ref 0.0–149.0)
Total CHOL/HDL Ratio: 4
VLDL: 15.6 mg/dL (ref 0.0–40.0)

## 2017-05-12 NOTE — Patient Instructions (Signed)
GO TO THE LAB : Get the blood work     GO TO THE FRONT DESK Schedule your next appointment for a  Check up in 6 months    Check the  blood pressure 2 or 3 times a   week   Be sure your blood pressure is between 110/65 and  145/85. If it is consistently higher or lower, let me know

## 2017-05-12 NOTE — Therapy (Signed)
[facility] [address]  [place], [place], [address] Phone: [phone]   Fax:  [phone]  Physical Therapy Treatment  Patient Details  Name: [name] MRN: [id number] Date of Birth: 1927-07-27 Referring Provider: Jessy Oto, MD  Encounter Date: 05/12/2017      PT End of Session - 05/12/17 1400    Visit Number 9   Number of Visits 16   Date for PT Re-Evaluation 06/10/17   Authorization Type Medicare / BCBS   PT Start Time 1400   PT Stop Time 1444   PT Time Calculation (min) 44 min   Activity Tolerance Patient tolerated treatment well;Patient limited by fatigue   Behavior During Therapy [facility] for tasks assessed/performed      Past Medical History:  Diagnosis Date  . Abdominal aortic aneurysm ([place]) 09/2008   s/o endovascular repair and angiogram 10/04/08   . Anemia    EGD Cscope 08-2012  . Bleeding ulcer 1997  . CKD (chronic kidney disease), stage IV (HCC)    Stable, Dr. Dimas Aguas  . COPD (chronic obstructive pulmonary disease) ([place])   . H/O hyperkalemia    Stable  . Hypertension   . Leukopenia 2/11   thought to be from doxy  . Renal cell carcinoma    clear cell type dx 10/09, s/p excision 10-24-08  . Renal insufficiency   . Solitary left kidney    W/ chronic tubule-interstitial damage, Dr. Dimas Aguas  . Spinal stenosis    causing LE weakness-persistant, being worked up by Hydrographic surveyor and neurologist    Past Surgical History:  Procedure Laterality Date  . ABDOMINAL AORTIC ANEURYSM REPAIR  2009   EVAR  . [place]  . [place] ([place]), 2004  . NEPHRECTOMY  2009   Open nephrectomy for cancer    There were no vitals filed for this visit.      Subjective Assessment - 05/12/17 1405    Subjective Pt dtr reports pt saw his PCP this morning and no concerns noted.   Patient Stated Goals "Walk good"   Currently in Pain? Yes   Pain Score 6    Pain Location  Back   Pain Orientation Lower   Pain Descriptors / Indicators Tiring;Heaviness   Pain Type Neuropathic pain   Pain Radiating Towards down back of legs, with "heat" on bottom of feet   Pain Onset More than a month ago   Pain Frequency Intermittent   Aggravating Factors  being upright   Pain Relieving Factors lying down                         [facility] Adult PT Treatment/Exercise - 05/12/17 1400      Ambulation/Gait   Ambulation/Gait Assistance 5: Supervision   Ambulation/Gait Assistance Details cues for upright posture, improved hip and knee flexion for increased foot clearance and increased step length with exagerated reciprocal arm swing   Ambulation Distance (Feet) 520 Feet  120 ft on tile, 400 ft on carpet   Assistive device None   Gait Pattern Step-through pattern;Decreased arm swing - right;Decreased arm swing - left;Decreased stride length;Trunk flexed;Right foot flat;Left foot flat   Ambulation Surface Level;Indoor     Knee/Hip Exercises: Aerobic   Nustep lvl 6 x 6'           PWR [facility]) - 05/12/17 1400    PWR! exercises Moves in standing  PWR! Up x10   PWR! Rock x10  PT facilitating wt shift   PWR! Twist x10   PWR Step x10   Comments chair placed in front of pt, but pt able to perform standing moves w/o need for UE support   PWR! Step Through Forward/Back x10 each B isolating fwd & back; x20 combined fwd/back; 1 hand support on back of chair   PWR! Sit to Stand x10   PWR! Multi-Directional Movements with Dual Tasks B PWR! sidestepping 2 x 80ft               PT Short Term Goals - 05/05/17 0844      PT SHORT TERM GOAL #1   Title Assess dynamic gait stability per DGI or FGA by 04/22/17   Status Achieved     PT SHORT TERM GOAL #2   Title Independent with OTAGO Fall Prevention Program HEP at supported level by 05/13/17   Status Achieved           PT Long Term Goals - 04/18/17 1616      PT LONG TERM GOAL #1   Title Pt will be  independent with OTAGO fall prevention HEP at unsupported level or other advanced balance HEP by 06/10/17   Status On-going     PT LONG TERM GOAL #2   Title B LE strength to >/= 4/5 for improved function by 06/10/17   Status On-going     PT LONG TERM GOAL #3   Title Pt will demonstrate improvement on the Berg Balance Scale to 48/56 or greater to reduce risk for falls by 06/10/17   Status On-going     PT LONG TERM GOAL #4   Title Pt will improve gait speed to 2.6 ft/sec or greater for improved safety with community ambulation by 06/10/17   Status On-going               Plan - 05/12/17 1515    Clinical Impression Statement Reviewed prior training in standing PWR! Moves emphasizing upright posture and weight shift to promote increased stability in standing. Introduced [employer]! step fwd/back and PWR! walk promoting upright posture, increased step length and improved foot clearance with good heel strike to normalize gait pattern and improve gait stability.   Rehab Potential Good   Clinical Impairments Affecting Rehab Potential Advanced age, Parkinson's disease, OA of mutiple joints, lumbar stenosis, LE neuropathy, HTN, COPD, CKD   PT Treatment/Interventions Patient/family education;Neuromuscular re-education;Balance training;Therapeutic exercise;Therapeutic activities;Functional mobility training;Gait training;Manual techniques;Taping;Dry needling;Electrical Stimulation;Moist Heat;Cryotherapy;ADLs/Self Care Home Management   PT Next Visit Plan Review stretches and postural re-education; review/progress [place] Fall Prevention Program progression as tolerated; balance & dynamic gait activities: PWR! Moves as appropriate   Consulted and Agree with Plan of Care Patient;Family member/caregiver   Family Member Consulted Dtr - [name]      Patient will benefit from skilled therapeutic intervention in order to improve the following deficits and impairments:  Abnormal gait, Difficulty walking, Decreased  activity tolerance, Decreased balance, Decreased coordination, Decreased safety awareness, Decreased strength, Impaired flexibility, Decreased range of motion, Postural dysfunction  Visit Diagnosis: Other abnormalities of gait and mobility  Difficulty in walking, not elsewhere classified  Unsteadiness on feet     Problem List Patient Active Problem List   Diagnosis Date Noted  . Follow-up --- PCP NOTES 09/02/2015  . Neuropathy 06/10/2015  . Pedal edema 01/30/2015  . Hip pain, chronic 01/30/2015  . Back pain 01/30/2015  . Cramps of lower extremity 01/30/2015  . Aftercare  following surgery of the circulatory system, [place] 08/05/2014  . Annual physical exam 10/19/2011  . DJD (degenerative joint disease) 12/14/2010  . Anemia 06/06/2009  . COPD (chronic obstructive pulmonary disease) ([place]) 06/06/2009  . RENAL CELL CANCER 12/25/2008  . HTN (hypertension) 12/25/2008  . AAA (abdominal aortic aneurysm) without rupture ([place]) 12/25/2008  . CKD (chronic kidney disease) 12/25/2008  . HYPERCHOLESTEROLEMIA 03/01/2008  . GERD 03/01/2008  . ACNE ROSACEA 11/29/2007  . DIVERTICULOSIS, COLON 04/12/2007    Percival Spanish, PT, MPT 05/12/2017, 3:21 PM  [facility] [address]  [place], [place], [address] Phone: [phone]   Fax:  [phone]  Name: [name] MRN: [id number] Date of Birth: 01/09/1927

## 2017-05-12 NOTE — Progress Notes (Signed)
Subjective:    Patient ID: [name], male    DOB: [birth year], 83 y.o.   MRN: [id number]  DOS:  05/12/2017 Type of visit - description : rov, here w/ July Since the last visit: Saw nephrology, last office visit 03-2017, note reviewed Saw hematology: Last visit 03-2017, note reviewed  In general feeling well. Good compliance of medications. HTN in the 140s, occasionally 150 and 160. Neuropathy is slowing him down, on Lyrica symptoms not completely well controlled  Review of Systems Memory at baseline, lives by himself and still functions well with the help of his family  Past Medical History:  Diagnosis Date  . Abdominal aortic aneurysm ([place]) 09/2008   s/o endovascular repair and angiogram 10/04/08   . Anemia    EGD Cscope 08-2012  . Bleeding ulcer 1997  . CKD (chronic kidney disease), stage IV (HCC)    Stable, Dr. Dimas Aguas  . COPD (chronic obstructive pulmonary disease) ([place])   . H/O hyperkalemia    Stable  . Hypertension   . Leukopenia 2/11   thought to be from doxy  . Renal cell carcinoma    clear cell type dx 10/09, s/p excision 10-24-08  . Renal insufficiency   . Solitary left kidney    W/ chronic tubule-interstitial damage, Dr. Dimas Aguas  . Spinal stenosis    causing LE weakness-persistant, being worked up by Hydrographic surveyor and neurologist    Past Surgical History:  Procedure Laterality Date  . ABDOMINAL AORTIC ANEURYSM REPAIR  2009   EVAR  . [place]  . [place] ([place]), 2004  . NEPHRECTOMY  2009   Open nephrectomy for cancer    Social History   Social History  . Marital status: Widowed    Spouse name: N/A  . Number of children: 3  . Years of education: N/A   Occupational History  . retired Retired   Social History Main Topics  . Smoking status: Former Smoker    Quit date: 12/28/2007  . Smokeless tobacco: Never Used     Comment: used to smoke 1.5 ppd  . Alcohol use No  . Drug use: No  . Sexual activity: Not on  file   Other Topics Concern  . Not on file   Social History Narrative   Lives by himself    Lost wife to dementia 10-2015   Totally independent on ADL , no driving   Often came to office w/ his daughter July [phone]), his other daughter is  [name] , son is [place]                         Allergies as of 05/12/2017      Reactions   Baclofen Other (See Comments)   Unable to walk per Pt's family    Iodinated Diagnostic Agents    Only one kidney    Ioxaglate    Only one kidney       Medication List       Accurate as of 05/12/17 11:59 PM. Always use your most recent med list.          Casanthranol-Docusate Sodium 30-100 MG Caps Take by mouth.   clobetasol 0.05 % external solution Commonly known as:  TEMOVATE APPLY AA BID   donepezil 5 MG tablet Commonly known as:  ARICEPT Take 5 mg by mouth at bedtime.   hydrALAZINE 25 MG tablet Commonly known as:  APRESOLINE Take 25  mg by mouth 3 (three) times daily.   hydrochlorothiazide 25 MG tablet Commonly known as:  HYDRODIURIL Take 1 table by mouth on M,W,F   labetalol 100 MG tablet Commonly known as:  NORMODYNE Take 1 tablet (100 mg total) by mouth 2 (two) times daily.   pregabalin 50 MG capsule Commonly known as:  LYRICA Take 50 mg by mouth at bedtime.   SE-TAN PLUS 162-115.2-1 MG Caps Take 1 tablet by mouth daily. Take 1 Tab  daily   sodium polystyrene 15 GM/60ML suspension Commonly known as:  KAYEXALATE Take 15 g by mouth on M,W, F.   Vitamin D 2000 units Caps Take 2,000 Units by mouth daily.          Objective:   Physical Exam BP 120/60 (BP Location: Left Arm, Patient Position: Sitting, Cuff Size: Normal)   Pulse (!) 59   Temp 97.8 F (36.6 C) (Oral)   Ht 5\' 9"  (1.753 m)   Wt 160 lb 3.2 oz (72.7 kg)   SpO2 93%   BMI 23.66 kg/m  General:   Well developed, well nourished , elderly gentleman in no distress.  HEENT:  Normocephalic . Face symmetric, atraumatic Lungs:  CTA B Normal  respiratory effort, no intercostal retractions, no accessory muscle use. Heart: RRR,  no murmur.  No pretibial edema bilaterally  Skin: Not pale. Not jaundice Neurologic:  alert & oriented to self and space, not oriented on time  Speech normal, gait appropriate for age and unassisted Psych--  Cognition and judgment appear intact.  Cooperative with normal attention span and concentration.  Behavior appropriate. No anxious or depressed appearing.      Assessment & Plan:   Assessment  Hypertension, COPD--former smoker,PFTs 8- 2013 mild obstruction otherwise normal.Asx  Renal: --H/o Renal Cancer  --Solitary left kidney --Chronic renal insufficiency --hematuria: Dr Hazle Nordmann, cysto 01-2017 Neuro:  neuropathy, saw neuro 06-2015, Dr Posey Pronto. As off 03-2016 is followed at [facility]:  Dr. Blair Promise gen neuro, Dr Tonye Royalty (movement d/o specialist)   MSK: --DJD --Mild spinal stenosis, has seen  Dr Flavia Shipper Osteopenia--DEXA 07-2015, osteopenia, rx vit D Pancytopenia-- sees hem-onc, 2011 felt to be from doxycycline. Last OV 03-2017 AAA s/p EVAR 2009 Acne rosacea  Plan: HTN: On hydralazine, labetalol, HCTZ 3 times a week, BPs in the 140s, occasionally higher. Recommend to continue monitoring and contact nephrology if BP is remain elevated Pancytopenia: Saw hematology, prescribe observation Renal cyst: s/p [place] 03-2017, Bosniak 2 Hyperlipidemia: Diet control, checking labs Neuropathy: Follow-up by neuro, sx  are slowing him down. RTC 6-8 months, he sees a number of other doctors.

## 2017-05-12 NOTE — Assessment & Plan Note (Signed)
Discussed shingles immunizations, at this point with multiple medical problems I am reluctant to proceed due to potential for side effects, patient and daughter agreed

## 2017-05-13 NOTE — Assessment & Plan Note (Signed)
HTN: On hydralazine, labetalol, HCTZ 3 times a week, BPs in the 140s, occasionally higher. Recommend to continue monitoring and contact nephrology if BP is remain elevated Pancytopenia: Saw hematology, prescribe observation Renal cyst: s/p [place] 03-2017, Bosniak 2 Hyperlipidemia: Diet control, checking labs Neuropathy: Follow-up by neuro, sx  are slowing him down. RTC 6-8 months, he sees a number of other doctors.

## 2017-05-15 DIAGNOSIS — R4182 Altered mental status, unspecified: Secondary | ICD-10-CM | POA: Diagnosis not present

## 2017-05-15 DIAGNOSIS — J440 Chronic obstructive pulmonary disease with acute lower respiratory infection: Secondary | ICD-10-CM | POA: Diagnosis present

## 2017-05-15 DIAGNOSIS — E860 Dehydration: Secondary | ICD-10-CM | POA: Diagnosis present

## 2017-05-15 DIAGNOSIS — N184 Chronic kidney disease, stage 4 (severe): Secondary | ICD-10-CM | POA: Diagnosis present

## 2017-05-15 DIAGNOSIS — H109 Unspecified conjunctivitis: Secondary | ICD-10-CM | POA: Diagnosis present

## 2017-05-15 DIAGNOSIS — I129 Hypertensive chronic kidney disease with stage 1 through stage 4 chronic kidney disease, or unspecified chronic kidney disease: Secondary | ICD-10-CM | POA: Diagnosis present

## 2017-05-15 DIAGNOSIS — I10 Essential (primary) hypertension: Secondary | ICD-10-CM | POA: Diagnosis not present

## 2017-05-15 DIAGNOSIS — N186 End stage renal disease: Secondary | ICD-10-CM | POA: Diagnosis not present

## 2017-05-15 DIAGNOSIS — Z87891 Personal history of nicotine dependence: Secondary | ICD-10-CM | POA: Diagnosis not present

## 2017-05-15 DIAGNOSIS — D61818 Other pancytopenia: Secondary | ICD-10-CM | POA: Diagnosis present

## 2017-05-15 DIAGNOSIS — Z8719 Personal history of other diseases of the digestive system: Secondary | ICD-10-CM | POA: Insufficient documentation

## 2017-05-15 DIAGNOSIS — J9601 Acute respiratory failure with hypoxia: Secondary | ICD-10-CM | POA: Diagnosis not present

## 2017-05-15 DIAGNOSIS — N179 Acute kidney failure, unspecified: Secondary | ICD-10-CM | POA: Diagnosis not present

## 2017-05-15 DIAGNOSIS — D638 Anemia in other chronic diseases classified elsewhere: Secondary | ICD-10-CM | POA: Diagnosis present

## 2017-05-15 DIAGNOSIS — G9340 Encephalopathy, unspecified: Secondary | ICD-10-CM | POA: Diagnosis not present

## 2017-05-15 DIAGNOSIS — F0390 Unspecified dementia without behavioral disturbance: Secondary | ICD-10-CM | POA: Diagnosis present

## 2017-05-15 DIAGNOSIS — E875 Hyperkalemia: Secondary | ICD-10-CM | POA: Diagnosis present

## 2017-05-15 DIAGNOSIS — K922 Gastrointestinal hemorrhage, unspecified: Secondary | ICD-10-CM | POA: Diagnosis not present

## 2017-05-15 DIAGNOSIS — Z23 Encounter for immunization: Secondary | ICD-10-CM | POA: Diagnosis not present

## 2017-05-15 DIAGNOSIS — D696 Thrombocytopenia, unspecified: Secondary | ICD-10-CM | POA: Diagnosis not present

## 2017-05-15 DIAGNOSIS — K648 Other hemorrhoids: Secondary | ICD-10-CM | POA: Diagnosis present

## 2017-05-15 DIAGNOSIS — K921 Melena: Secondary | ICD-10-CM | POA: Diagnosis present

## 2017-05-15 DIAGNOSIS — D500 Iron deficiency anemia secondary to blood loss (chronic): Secondary | ICD-10-CM | POA: Diagnosis not present

## 2017-05-15 DIAGNOSIS — G629 Polyneuropathy, unspecified: Secondary | ICD-10-CM | POA: Diagnosis present

## 2017-05-15 DIAGNOSIS — J189 Pneumonia, unspecified organism: Secondary | ICD-10-CM | POA: Diagnosis not present

## 2017-05-15 DIAGNOSIS — Z9889 Other specified postprocedural states: Secondary | ICD-10-CM

## 2017-05-15 DIAGNOSIS — N183 Chronic kidney disease, stage 3 (moderate): Secondary | ICD-10-CM | POA: Diagnosis not present

## 2017-05-15 DIAGNOSIS — J181 Lobar pneumonia, unspecified organism: Secondary | ICD-10-CM | POA: Diagnosis not present

## 2017-05-15 DIAGNOSIS — R531 Weakness: Secondary | ICD-10-CM | POA: Diagnosis not present

## 2017-05-17 ENCOUNTER — Ambulatory Visit: Payer: Medicare Other | Admitting: Physical Therapy

## 2017-05-17 ENCOUNTER — Telehealth: Payer: Self-pay | Admitting: Internal Medicine

## 2017-05-17 NOTE — Telephone Encounter (Signed)
Okay to put 2-15 minute appts together.

## 2017-05-17 NOTE — Telephone Encounter (Signed)
Left message on daughter's voicemail to call back. Patient scheduled for 6/1 @ 2:30 for Hospital Follow up per CMA Kaylyn.

## 2017-05-17 NOTE — Telephone Encounter (Signed)
Caller name: Relationship to patient: Can be reached: [phone] Pharmacy:  Reason for call: Patients daughter request to bring patient in for a Hospital Follow up but does not want to come in at 11:30. Request a 2:30 on 6/1?

## 2017-05-24 ENCOUNTER — Ambulatory Visit: Payer: Medicare Other | Admitting: Rehabilitation

## 2017-05-24 ENCOUNTER — Encounter: Payer: Self-pay | Admitting: Rehabilitation

## 2017-05-24 DIAGNOSIS — N183 Chronic kidney disease, stage 3 (moderate): Secondary | ICD-10-CM | POA: Diagnosis not present

## 2017-05-24 DIAGNOSIS — R2681 Unsteadiness on feet: Secondary | ICD-10-CM | POA: Diagnosis not present

## 2017-05-24 DIAGNOSIS — I10 Essential (primary) hypertension: Secondary | ICD-10-CM | POA: Diagnosis not present

## 2017-05-24 DIAGNOSIS — R262 Difficulty in walking, not elsewhere classified: Secondary | ICD-10-CM | POA: Diagnosis not present

## 2017-05-24 DIAGNOSIS — R293 Abnormal posture: Secondary | ICD-10-CM

## 2017-05-24 DIAGNOSIS — R2689 Other abnormalities of gait and mobility: Secondary | ICD-10-CM | POA: Diagnosis not present

## 2017-05-24 NOTE — Therapy (Signed)
[facility] [address]  [place], [place], [address] Phone: [phone]   Fax:  [phone]  Physical Therapy Treatment  Patient Details  Name: [name] MRN: [id number] Date of Birth: [birth year] Referring Provider: Jessy Oto, MD  Encounter Date: 05/24/2017      PT End of Session - 05/24/17 0847    Visit Number 10   Date for PT Re-Evaluation 06/10/17   PT Start Time 0845   PT Stop Time 0930   PT Time Calculation (min) 45 min   Activity Tolerance Patient tolerated treatment well      Past Medical History:  Diagnosis Date  . Abdominal aortic aneurysm ([place]) 09/2008   s/o endovascular repair and angiogram 10/04/08   . Anemia    EGD Cscope 08-2012  . Bleeding ulcer 1997  . CKD (chronic kidney disease), stage IV (HCC)    Stable, Dr. Dimas Aguas  . COPD (chronic obstructive pulmonary disease) ([place])   . H/O hyperkalemia    Stable  . Hypertension   . Leukopenia 2/11   thought to be from doxy  . Renal cell carcinoma    clear cell type dx 10/09, s/p excision 10-24-08  . Renal insufficiency   . Solitary left kidney    W/ chronic tubule-interstitial damage, Dr. Dimas Aguas  . Spinal stenosis    causing LE weakness-persistant, being worked up by Hydrographic surveyor and neurologist    Past Surgical History:  Procedure Laterality Date  . ABDOMINAL AORTIC ANEURYSM REPAIR  2009   EVAR  . [place]  . [place] ([place]), 2004  . NEPHRECTOMY  2009   Open nephrectomy for cancer    There were no vitals filed for this visit.      Subjective Assessment - 05/24/17 0856    Subjective Just got out of the hospital for Pneumonia.  Reports feeling back to normal.  Will be returning to MD tomorrow for recheck but was discharged without limitation per dtr.     Currently in Pain? No/denies            [facility] PT Assessment - 05/24/17 0001      Observation/Other Assessments   Focus on  Therapeutic Outcomes (FOTO)  48% limited                     [facility] Adult PT Treatment/Exercise - 05/24/17 0001      Ambulation/Gait   Ambulation/Gait Assistance 5: Supervision   Ambulation/Gait Assistance Details cues for upright posture and increased step length   Ambulation Distance (Feet) 500 Feet   Gait Comments resting x 1 due to leg fatigue x 15"     Exercises   Exercises Knee/Hip     Knee/Hip Exercises: Aerobic   Nustep lvl 6 x 6'     Knee/Hip Exercises: Machines for Strengthening   Cybex Knee Extension 10#x10     Knee/Hip Exercises: Standing   Heel Raises 15 reps;Both   Heel Raises Limitations at counter     Knee/Hip Exercises: Seated   Sit to Sand 10 reps  with beach ball reach overhead with PT standing on table           P[facility]) - 05/24/17 0911    PWR! Rock 10   PWR Step 4   Comments difficulting coordinating step today           Balance Exercises - 05/24/17 [phone]  Balance Exercises: Standing   Sidestepping 4 reps  at counter no hold reaching for colored dots with trunk rot             PT Short Term Goals - June 02, 2017 0854      PT SHORT TERM GOAL #1   Title Assess dynamic gait stability per DGI or FGA by 04/22/17   Status Achieved     PT SHORT TERM GOAL #2   Title Independent with OTAGO Fall Prevention Program HEP at supported level by 05/13/17   Status Achieved           PT Long Term Goals - 06/02/2017 0854      PT LONG TERM GOAL #1   Title Pt will be independent with OTAGO fall prevention HEP at unsupported level or other advanced balance HEP by 06/10/17   Status On-going     PT LONG TERM GOAL #2   Title B LE strength to >/= 4/5 for improved function by 06/10/17   Status On-going     PT LONG TERM GOAL #3   Title Pt will demonstrate improvement on the Berg Balance Scale to 48/56 or greater to reduce risk for falls by 06/10/17   Status Achieved     PT LONG TERM GOAL #4   Title Pt will improve gait speed to 2.6  ft/sec or greater for improved safety with community ambulation by 06/10/17   Status On-going     PT LONG TERM GOAL #5   Title Patient will demonstrate improvement on the Berg Balance Scale to 49/56 or greater to reduce risk for falls (09/12/15)   Status Achieved               Plan - 06/02/2017 0930    Clinical Impression Statement focus on weight shift and gait in different directions today as well as improving upright posture.  Pt without apparent set back from pneumonia/hospital stay except for reporting some more fatigue than normal.  Did require 1 rest break for gait due to leg fatigue but only a quick standing rest break.  Added LE knee extension due to pt reporting wanting to strengthen the thighs more.  Pt will continue with current POC   Clinical Impairments Affecting Rehab Potential Advanced age, Parkinson's disease, OA of mutiple joints, lumbar stenosis, LE neuropathy, HTN, COPD, CKD   PT Frequency 2x / week   PT Duration 8 weeks   PT Treatment/Interventions Patient/family education;Neuromuscular re-education;Balance training;Therapeutic exercise;Therapeutic activities;Functional mobility training;Gait training;Manual techniques;Taping;Dry needling;Electrical Stimulation;Moist Heat;Cryotherapy;ADLs/Self Care Home Management   PT Next Visit Plan Review stretches and postural re-education; review/progress [place] Fall Prevention Program progression as tolerated; balance & dynamic gait activities: PWR! Moves as appropriate      Patient will benefit from skilled therapeutic intervention in order to improve the following deficits and impairments:  Abnormal gait, Difficulty walking, Decreased activity tolerance, Decreased balance, Decreased coordination, Decreased safety awareness, Decreased strength, Impaired flexibility, Decreased range of motion, Postural dysfunction  Visit Diagnosis: Other abnormalities of gait and mobility  Difficulty in walking, not elsewhere  classified  Abnormal posture       G-Codes - June 02, 2017 0937    Functional Assessment Tool Used (Outpatient Only) Clinical Judgement / FOTO: 40% limited   Functional Limitation Mobility: Walking and moving around   Mobility: Walking and Moving Around Current Status (N6295) At least 40 percent but less than 60 percent impaired, limited or restricted   Mobility: Walking and Moving Around Goal Status (M8413) At least 20 percent but less  than 40 percent impaired, limited or restricted      Problem List Patient Active Problem List   Diagnosis Date Noted  . Status post dilatation of esophageal stricture 05/15/2017  . Pancytopenia ([place]) 03/31/2016  . Follow-up --- PCP NOTES 09/02/2015  . Neuropathy 06/10/2015  . Pedal edema 01/30/2015  . Hip pain, chronic 01/30/2015  . Back pain 01/30/2015  . Cramps of lower extremity 01/30/2015  . Aftercare following surgery of the circulatory system, [place] 08/05/2014  . Annual physical exam 10/19/2011  . DJD (degenerative joint disease) 12/14/2010  . Anemia 06/06/2009  . COPD (chronic obstructive pulmonary disease) ([place]) 06/06/2009  . RENAL CELL CANCER 12/25/2008  . HTN (hypertension) 12/25/2008  . AAA (abdominal aortic aneurysm) without rupture ([place]) 12/25/2008  . CKD (chronic kidney disease) 12/25/2008  . HYPERCHOLESTEROLEMIA 03/01/2008  . GERD 03/01/2008  . ACNE ROSACEA 11/29/2007  . DIVERTICULOSIS, COLON 04/12/2007    Stark Bray, DPT, CMP 05/24/2017, 9:39 AM  [facility], [place], [address] Phone: [phone]   Fax:  [phone]  Name: [name] MRN: [id number] Date of Birth: [birth year]

## 2017-05-26 ENCOUNTER — Ambulatory Visit: Payer: Medicare Other | Admitting: Physical Therapy

## 2017-05-26 DIAGNOSIS — R2689 Other abnormalities of gait and mobility: Secondary | ICD-10-CM | POA: Diagnosis not present

## 2017-05-26 DIAGNOSIS — R2681 Unsteadiness on feet: Secondary | ICD-10-CM

## 2017-05-26 DIAGNOSIS — N183 Chronic kidney disease, stage 3 (moderate): Secondary | ICD-10-CM | POA: Diagnosis not present

## 2017-05-26 DIAGNOSIS — I10 Essential (primary) hypertension: Secondary | ICD-10-CM | POA: Diagnosis not present

## 2017-05-26 DIAGNOSIS — N390 Urinary tract infection, site not specified: Secondary | ICD-10-CM | POA: Diagnosis not present

## 2017-05-26 DIAGNOSIS — R293 Abnormal posture: Secondary | ICD-10-CM | POA: Diagnosis not present

## 2017-05-26 DIAGNOSIS — R262 Difficulty in walking, not elsewhere classified: Secondary | ICD-10-CM | POA: Diagnosis not present

## 2017-05-26 NOTE — Therapy (Signed)
[facility] [address]  [place], [place], [address] Phone: [phone]   Fax:  [phone]  Physical Therapy Treatment  Patient Details  Name: [name] MRN: [id number] Date of Birth: Sep 30, 1927 Referring Provider: Jessy Oto, MD  Encounter Date: 05/26/2017      PT End of Session - 05/26/17 0800    Visit Number 11   Number of Visits 16   Date for PT Re-Evaluation 06/10/17   Authorization Type Medicare / BCBS   PT Start Time 0800   PT Stop Time 0845   PT Time Calculation (min) 45 min   Activity Tolerance Patient tolerated treatment well   Behavior During Therapy [facility] for tasks assessed/performed      Past Medical History:  Diagnosis Date  . Abdominal aortic aneurysm ([place]) 09/2008   s/o endovascular repair and angiogram 10/04/08   . Anemia    EGD Cscope 08-2012  . Bleeding ulcer 1997  . CKD (chronic kidney disease), stage IV (HCC)    Stable, Dr. Dimas Aguas  . COPD (chronic obstructive pulmonary disease) ([place])   . H/O hyperkalemia    Stable  . Hypertension   . Leukopenia 2/11   thought to be from doxy  . Renal cell carcinoma    clear cell type dx 10/09, s/p excision 10-24-08  . Renal insufficiency   . Solitary left kidney    W/ chronic tubule-interstitial damage, Dr. Dimas Aguas  . Spinal stenosis    causing LE weakness-persistant, being worked up by Hydrographic surveyor and neurologist    Past Surgical History:  Procedure Laterality Date  . ABDOMINAL AORTIC ANEURYSM REPAIR  2009   EVAR  . [place]  . [place] ([place]), 2004  . NEPHRECTOMY  2009   Open nephrectomy for cancer    There were no vitals filed for this visit.      Subjective Assessment - 05/26/17 0809    Subjective Pt reporting feeling more tired today.   Patient Stated Goals "Walk good"   Currently in Pain? No/denies            [facility] PT Assessment - 05/26/17 0800      Assessment   Medical  Diagnosis Unsteady gait   Referring Provider Jessy Oto, MD   Next MD Visit 09/30/17 with Dr. Louanne Skye; 05/27/17 with [name] (PCP)     Ambulation/Gait   Gait velocity 2.13 ft/sec     Standardized Balance Assessment   Standardized Balance Assessment Berg Balance Test;10 meter walk test;Dynamic Gait Index   10 Meter Walk 15.40"     Berg Balance Test   Sit to Stand Able to stand without using hands and stabilize independently   Standing Unsupported Able to stand safely 2 minutes   Sitting with Back Unsupported but Feet Supported on Floor or Stool Able to sit safely and securely 2 minutes   Stand to Sit Uses backs of legs against chair to control descent   Transfers Able to transfer safely, minor use of hands   Standing Unsupported with Eyes Closed Able to stand 10 seconds with supervision   Standing Ubsupported with Feet Together Able to place feet together independently and stand for 1 minute with supervision   From Standing, Reach Forward with Outstretched Arm Can reach forward >5 cm safely (2")   From Standing Position, Pick up Object from Floor Able to pick up shoe, needs supervision   From Standing Position,  Turn to Look Behind Over each Shoulder Turn sideways only but maintains balance   Turn 360 Degrees Able to turn 360 degrees safely but slowly   Standing Unsupported, Alternately Place Feet on Step/Stool Able to complete 4 steps without aid or supervision   Standing Unsupported, One Foot in Front Able to plae foot ahead of the other independently and hold 30 seconds   Standing on One Leg Tries to lift leg/unable to hold 3 seconds but remains standing independently   Total Score 39   Berg comment: 37-45 significant fall risk (>80%)      Dynamic Gait Index   Level Surface Mild Impairment   Change in Gait Speed Mild Impairment   Gait with Horizontal Head Turns Moderate Impairment   Gait with Vertical Head Turns Moderate Impairment   Gait and Pivot Turn Moderate Impairment   Step  Over Obstacle Moderate Impairment   Step Around Obstacles Moderate Impairment   Steps Moderate Impairment   Total Score 10   DGI comment: Scores of 19 or less are predictive of falls in older community living adults                        [facility]) - 05/26/17 0800    PWR! Up x10   PWR! Rock x10   PWR! Twist x10   PWR Step x10               PT Short Term Goals - 05/24/17 0854      PT SHORT TERM GOAL #1   Title Assess dynamic gait stability per DGI or FGA by 04/22/17   Status Achieved     PT SHORT TERM GOAL #2   Title Independent with OTAGO Fall Prevention Program HEP at supported level by 05/13/17   Status Achieved           PT Long Term Goals - 05/26/17 0813      PT LONG TERM GOAL #1   Title Pt will be independent with OTAGO fall prevention HEP at unsupported level or other advanced balance HEP by 06/10/17   Status On-going     PT LONG TERM GOAL #2   Title B LE strength to >/= 4/5 for improved function by 06/10/17   Status On-going     PT LONG TERM GOAL #3   Title Pt will demonstrate improvement on the Berg Balance Scale to 48/56 or greater to reduce risk for falls by 06/10/17   Status On-going     PT LONG TERM GOAL #4   Title Pt will improve gait speed to 2.6 ft/sec or greater for improved safety with community ambulation by 06/10/17   Status On-going               Plan - 05/26/17 0845    Clinical Impression Statement Pt continuing to report increased "tiredness" and fatigue since hospitalization for pneumonia, but has still demonstrated progress with standardized testing for gait and balance - gait speed increased to 2.13 ft/sec and Berg improved by 8 points from 31/56 to 39/56. DGI overall score unchanged with improvement noted in some areas and others more challenging today. Pt requiring increased rest breaks today due to fatigue.   Clinical Impairments Affecting Rehab Potential Advanced age, Parkinson's disease, OA of mutiple joints,  lumbar stenosis, LE neuropathy, HTN, COPD, CKD   PT Frequency 2x / week   PT Duration 8 weeks   PT Treatment/Interventions Patient/family education;Neuromuscular re-education;Balance training;Therapeutic exercise;Therapeutic activities;Functional mobility training;Gait  training;Manual techniques;Taping;Dry needling;Electrical Stimulation;Moist Heat;Cryotherapy;ADLs/Self Care Home Management   PT Next Visit Plan Update HEP as indicated and/or review/progress [place] Fall Prevention Program progression as tolerated; balance & dynamic gait activities: PWR! Moves as appropriate   Consulted and Agree with Plan of Care Patient;Family member/caregiver   Family Member Consulted Dtr - [name]      Patient will benefit from skilled therapeutic intervention in order to improve the following deficits and impairments:  Abnormal gait, Difficulty walking, Decreased activity tolerance, Decreased balance, Decreased coordination, Decreased safety awareness, Decreased strength, Impaired flexibility, Decreased range of motion, Postural dysfunction  Visit Diagnosis: Other abnormalities of gait and mobility  Difficulty in walking, not elsewhere classified  Unsteadiness on feet     Problem List Patient Active Problem List   Diagnosis Date Noted  . Status post dilatation of esophageal stricture 05/15/2017  . Pancytopenia ([place]) 03/31/2016  . Follow-up --- PCP NOTES 09/02/2015  . Neuropathy 06/10/2015  . Pedal edema 01/30/2015  . Hip pain, chronic 01/30/2015  . Back pain 01/30/2015  . Cramps of lower extremity 01/30/2015  . Aftercare following surgery of the circulatory system, [place] 08/05/2014  . Annual physical exam 10/19/2011  . DJD (degenerative joint disease) 12/14/2010  . Anemia 06/06/2009  . COPD (chronic obstructive pulmonary disease) ([place]) 06/06/2009  . RENAL CELL CANCER 12/25/2008  . HTN (hypertension) 12/25/2008  . AAA (abdominal aortic aneurysm) without rupture ([place]) 12/25/2008  . CKD (chronic  kidney disease) 12/25/2008  . HYPERCHOLESTEROLEMIA 03/01/2008  . GERD 03/01/2008  . ACNE ROSACEA 11/29/2007  . DIVERTICULOSIS, COLON 04/12/2007    Percival Spanish, PT, MPT 05/26/2017, 9:30 AM  [facility] [address]  [place], [place], [address] Phone: [phone]   Fax:  [phone]  Name: [name] MRN: [id number] Date of Birth: 1927/09/13

## 2017-05-27 ENCOUNTER — Inpatient Hospital Stay: Payer: Medicare Other | Admitting: Internal Medicine

## 2017-05-27 ENCOUNTER — Ambulatory Visit (INDEPENDENT_AMBULATORY_CARE_PROVIDER_SITE_OTHER): Payer: Medicare Other | Admitting: Internal Medicine

## 2017-05-27 ENCOUNTER — Encounter: Payer: Self-pay | Admitting: Internal Medicine

## 2017-05-27 VITALS — BP 140/80 | HR 60 | Temp 97.8°F | Ht 69.0 in | Wt 163.0 lb

## 2017-05-27 DIAGNOSIS — N189 Chronic kidney disease, unspecified: Secondary | ICD-10-CM | POA: Diagnosis not present

## 2017-05-27 DIAGNOSIS — J189 Pneumonia, unspecified organism: Secondary | ICD-10-CM

## 2017-05-27 DIAGNOSIS — J181 Lobar pneumonia, unspecified organism: Secondary | ICD-10-CM

## 2017-05-27 LAB — CBC WITH DIFFERENTIAL/PLATELET
BASOS PCT: 1 %
Basophils Absolute: 23 cells/uL (ref 0–200)
EOS ABS: 138 {cells}/uL (ref 15–500)
Eosinophils Relative: 6 %
HCT: 29.9 % — ABNORMAL LOW (ref 38.5–50.0)
Hemoglobin: 9.7 g/dL — ABNORMAL LOW (ref 13.2–17.1)
LYMPHS PCT: 33 %
Lymphs Abs: 759 cells/uL — ABNORMAL LOW (ref 850–3900)
MCH: 30.2 pg (ref 27.0–33.0)
MCHC: 32.4 g/dL (ref 32.0–36.0)
MCV: 93.1 fL (ref 80.0–100.0)
MONOS PCT: 7 %
MPV: 9.5 fL (ref 7.5–12.5)
Monocytes Absolute: 161 cells/uL — ABNORMAL LOW (ref 200–950)
Neutro Abs: 1219 cells/uL — ABNORMAL LOW (ref 1500–7800)
Neutrophils Relative %: 53 %
PLATELETS: 91 10*3/uL — AB (ref 140–400)
RBC: 3.21 MIL/uL — ABNORMAL LOW (ref 4.20–5.80)
RDW: 15 % (ref 11.0–15.0)
WBC: 2.3 10*3/uL — ABNORMAL LOW (ref 3.8–10.8)

## 2017-05-27 LAB — BASIC METABOLIC PANEL
BUN: 31 mg/dL — AB (ref 7–25)
CHLORIDE: 109 mmol/L (ref 98–110)
CO2: 28 mmol/L (ref 20–31)
CREATININE: 2.82 mg/dL — AB (ref 0.70–1.11)
Calcium: 8.4 mg/dL — ABNORMAL LOW (ref 8.6–10.3)
Glucose, Bld: 124 mg/dL — ABNORMAL HIGH (ref 65–99)
Potassium: 4.2 mmol/L (ref 3.5–5.3)
Sodium: 146 mmol/L (ref 135–146)

## 2017-05-27 NOTE — Patient Instructions (Signed)
GO TO THE LAB : Get the blood work      Robitussin 1 or 2 teaspoons at night for the next week  Flonase: 2 sprays on each side of the nose at bedtime  If you continue having problems with mucous let us know  Go to the first floor  around one month from today and get a chest x-ray to follow-up on the pneumonia

## 2017-05-27 NOTE — Progress Notes (Signed)
Subjective:    Patient ID: [name], male    DOB: 1927-12-11, 83 y.o.   MRN: [id number]  DOS:  05/27/2017 Type of visit - description : Hospital follow-up Interval history: Admitted to hospital 05/15/2017, discharge on the 22nd: Diagnosed with community-acquired pneumonia LLL. Went to the ER complaining of fever, cough, upper respiratory symptoms, chest x-ray + for a  infiltrate. was treated with Rocephin and Zithromax, blood cultures negative than 24 hours. Also diagnosed with acute respiratory failure with hypoxia , acute encephalopathy, acute on chronic kidney injury  Last CBC show hemoglobin of 9.5, platelets of 71, WBCs 3.5.. Last creatinine 2.1, down from 2.49. Calcium was a slightly low at 8.1. Potassium was normal. Hemoccult was positive, was on Protonix, he did not have any GI symptoms Blood cultures negative   Review of Systems The patient is back home, doing well. No further fever chills. No nausea, vomiting, diarrhea. No abdominal pain and change in the color of the stools. His daughter reports mental status is back to normal. No cough but last night had a lot of mucus accumulated in the throat and had a hard time getting rid of it. Denies GERD symptoms or postnasal dripping.  Past Medical History:  Diagnosis Date  . Abdominal aortic aneurysm ([place]) 09/2008   s/o endovascular repair and angiogram 10/04/08   . Anemia    EGD Cscope 08-2012  . Bleeding ulcer 1997  . CKD (chronic kidney disease), stage IV (HCC)    Stable, Dr. Dimas Aguas  . COPD (chronic obstructive pulmonary disease) ([place])   . H/O hyperkalemia    Stable  . Hypertension   . Leukopenia 2/11   thought to be from doxy  . Renal cell carcinoma    clear cell type dx 10/09, s/p excision 10-24-08  . Renal insufficiency   . Solitary left kidney    W/ chronic tubule-interstitial damage, Dr. Dimas Aguas  . Spinal stenosis    causing LE weakness-persistant, being worked up by Hydrographic surveyor and  neurologist    Past Surgical History:  Procedure Laterality Date  . ABDOMINAL AORTIC ANEURYSM REPAIR  2009   EVAR  . [place]  . [place] ([place]), 2004  . NEPHRECTOMY  2009   Open nephrectomy for cancer    Social History   Social History  . Marital status: Widowed    Spouse name: N/A  . Number of children: 3  . Years of education: N/A   Occupational History  . retired Retired   Social History Main Topics  . Smoking status: Former Smoker    Quit date: 12/28/2007  . Smokeless tobacco: Never Used     Comment: used to smoke 1.5 ppd  . Alcohol use No  . Drug use: No  . Sexual activity: Not on file   Other Topics Concern  . Not on file   Social History Narrative   Lives by himself    Lost wife to dementia 10-2015   Totally independent on ADL , no driving   Often came to office w/ his daughter July [phone]), his other daughter is  [name] , son is [place]                         Allergies as of 05/27/2017      Reactions   Baclofen Other (See Comments)   Unable to walk per Pt's family    Iodinated Diagnostic Agents  Only one kidney    Ioxaglate    Only one kidney       Medication List       Accurate as of 05/27/17 11:59 PM. Always use your most recent med list.          Casanthranol-Docusate Sodium 30-100 MG Caps Take by mouth.   clobetasol 0.05 % external solution Commonly known as:  TEMOVATE APPLY AA BID   donepezil 5 MG tablet Commonly known as:  ARICEPT Take 5 mg by mouth at bedtime.   hydrALAZINE 25 MG tablet Commonly known as:  APRESOLINE Take 25 mg by mouth 3 (three) times daily.   hydrochlorothiazide 25 MG tablet Commonly known as:  HYDRODIURIL Take 1 table by mouth on M,W,F   labetalol 100 MG tablet Commonly known as:  NORMODYNE Take 1 tablet (100 mg total) by mouth 2 (two) times daily.   pregabalin 50 MG capsule Commonly known as:  LYRICA Take 50 mg by mouth at bedtime.   SE-TAN PLUS 162-115.2-1 MG  Caps Take 1 tablet by mouth daily. Take 1 Tab  daily   sodium polystyrene 15 GM/60ML suspension Commonly known as:  KAYEXALATE Take 15 g by mouth on M,W, F.   Vitamin D 2000 units Caps Take 2,000 Units by mouth daily.          Objective:   Physical Exam BP 140/80 (BP Location: Left Arm, Patient Position: Sitting, Cuff Size: Normal)   Pulse 60   Temp 97.8 F (36.6 C) (Oral)   Ht 5\' 9"  (1.753 m)   Wt 163 lb (73.9 kg)   SpO2 94%   BMI 24.07 kg/m  General:   Well developed, well nourished . NAD.  HEENT:  Normocephalic . Face symmetric, atraumatic. Nose not congested  Lungs: Minimal rhonchi with cough Normal respiratory effort, no intercostal retractions, no accessory muscle use. Heart: RRR,  no murmur.  trace pretibial edema bilaterally  Skin: Not pale. Not jaundice Neurologic:  alert & oriented X3.  Speech normal, gait appropriate for age and unassisted Psych--  Cognition and judgment appear intact.  Cooperative with normal attention span and concentration.  Behavior appropriate. No anxious or depressed appearing.      Assessment & Plan:    Assessment  Hypertension, COPD--former smoker,PFTs 8- 2013 mild obstruction otherwise normal.Asx  Renal: --H/o Renal Cancer  --Solitary left kidney --Chronic renal insufficiency --hematuria: Dr Hazle Nordmann, cysto 01-2017 Neuro:  neuropathy, saw neuro 06-2015, Dr Posey Pronto. As off 03-2016 is followed at [facility]:  Dr. Blair Promise gen neuro, Dr Tonye Royalty (movement d/o specialist)   MSK: --DJD --Mild spinal stenosis, has seen  Dr Flavia Shipper Osteopenia--DEXA 07-2015, osteopenia, rx vit D Pancytopenia-- sees hem-onc, 2011 felt to be from doxycycline. Last OV 03-2017 AAA s/p EVAR 2009 Acne rosacea  Plan:  Community-acquired pneumonia: LLL infiltrate on x-ray, status post antibiotics, doing well. Creatinine was a slightly elevated and hemoglobin is slightly low. He had a + Hemoccult @ the hospital however no GI symptoms. He had a lot of mucus in his  throat last night and concerned about it. Plan: CBC BMP today. Chest x-ray in 4 weeks. Robitussin and Flonase for his mucus accumulation. Call if not better. GI bleed? Had a positive Hemoccult blood in the hospital but no GI symptoms, hemoglobin was  slightly lower than before, will recheck that today.

## 2017-05-27 NOTE — Progress Notes (Signed)
Pre visit review using our clinic review tool, if applicable. No additional management support is needed unless otherwise documented below in the visit note. 

## 2017-05-28 NOTE — Assessment & Plan Note (Signed)
Plan:  Community-acquired pneumonia: LLL infiltrate on x-ray, status post antibiotics, doing well. Creatinine was a slightly elevated and hemoglobin is slightly low. He had a + Hemoccult @ the hospital however no GI symptoms. He had a lot of mucus in his throat last night and concerned about it. Plan: CBC BMP today. Chest x-ray in 4 weeks. Robitussin and Flonase for his mucus accumulation. Call if not better. GI bleed? Had a positive Hemoccult blood in the hospital but no GI symptoms, hemoglobin was  slightly lower than before, will recheck that today.

## 2017-05-30 ENCOUNTER — Ambulatory Visit: Payer: Medicare Other | Attending: Surgery | Admitting: Physical Therapy

## 2017-05-30 DIAGNOSIS — R2681 Unsteadiness on feet: Secondary | ICD-10-CM | POA: Diagnosis not present

## 2017-05-30 DIAGNOSIS — R293 Abnormal posture: Secondary | ICD-10-CM | POA: Diagnosis not present

## 2017-05-30 DIAGNOSIS — R2689 Other abnormalities of gait and mobility: Secondary | ICD-10-CM | POA: Insufficient documentation

## 2017-05-30 DIAGNOSIS — R262 Difficulty in walking, not elsewhere classified: Secondary | ICD-10-CM

## 2017-05-30 NOTE — Addendum Note (Signed)
Addended byDamita Dunnings D on: 05/30/2017 02:00 PM   Modules accepted: Orders

## 2017-05-30 NOTE — Patient Instructions (Addendum)
Community Programs Location  Contact Information Description of Services Cost  A Matter of Balance Class locations vary. Call [facility] Area Agency on Aging for more information.  www.ptrc.org [phone] 8-Session program addressing the fear of falling and increasing activity levels of older adults Free to minimal cost  [facility]. [facility] [address] [place] [address], [place], [place] [address].  [email or web address] [phone]  Personal training, gym, classes including Silver Sneakers* and ACTion for Aging Adults Fee-based  A.H.O.Y. (Add Health to Our Years) Airs on [employer] Channel 13, M-F at 8am and 1pm  [place] Locations: [facility],  [address] [facility], [address] [facility] [address] [facility],  [address] [facility], [address] [facility], [address] [facility], [address] [facility], [address] [facility], [address] [facility] [address]  [place] Location: [facility] [address]      [phone]  [phone]  [phone]  [phone]  [phone]  [phone]  [phone]  [phone]  [phone]  [phone]    [phone] A total-body conditioning class for adults 55 and older; designed to increase muscular strength, endurance, range of movement, flexibility, balance, agility and coordination Free  [facility] [address] [place], [place] [address] [phone] Silver Sneakers* Tai Chi Fee-based       [facility]      [address]      [phone]      Pilate's class for individualsreturning to exercise after an injury, before or after surgery or for individuals with complex musculoskeletal issues; designed to improve strength, balance , flexibility      $15/class  [place]  [facility] [address] [place], [place] [address] [email or web address] [phone] Tai Chi classes for beginners to advanced Donation-based  [facility] [address] [place], [place] [address] [email or web address] [phone]  Tai Chi Chair Exercises Free, ages 60 and older; Ages 55-59 fee based  [facility] [address] [place], [place] [address] [email or web address] [phone]  A.H.O.Y. Tai Chi Fee-based Donation based or free  [facility] Tai Chi Class locations vary.  Call or email Eric Reiss or view website for more information. [email or web address] [phone] Ongoing classes at local YMCAs and gyms Fee-based  Silver Sneakers [facility] [employer]: Area [facility] [phone]  [phone] Classes designed for older adults who want to improve their strength, flexibility, balance and endurance.   Silver sneakers is covered by some insurance plans and includes a fitness center membership at participating locations. Find out more by calling [phone] or visiting [email or web address] Covered by some insurance plans  [facility] [address]  [place] [phone] A.H.O.Y., fitness room, personal training, fitness classes for injury prevention, strength, balance, flexibility, water fitness classes Ages 55+: $60 for 6 months; Ages 18-54: $70 for 6 months  [facility]  [facility] [address] , [place] [address] [email or web address] [phone] Tai Chi classes for beginners to advanced; geared for seniors Donation Based        [employer] ([employer] Others Participate in Exercise     Paul G. Davis, PhD, RCEP 4643 [email or web address] [facility] [facility]     [phone]     A comprehensive fitness program for adults.  The program paris senior-level undergraduates Kinesiology students with adults who desire to learn how to exercise safely.  Includes a structural exercise class focusing on functional fitnesss     $100/semester in fall and spring; $75 in summer (no trainers)    *Silver Sneakers is covered by some insurance plans and includes a  Fitness center membership at participating locations.  Find out more by calling [phone] or visiting [email or web address]  For additional health and human services resources for senior adults, please contact [facility] at [phone] in [place] and [place] at [phone] in all other areas. 

## 2017-05-30 NOTE — Therapy (Signed)
[place] [address]  [place], [place], [address] Phone: [phone]   Fax:  [phone]  Physical Therapy Treatment  Patient Details  Name: [name] MRN: [id number] Date of Birth: 09-30-1927 Referring Provider: Jessy Oto, MD  Encounter Date: 05/30/2017      PT End of Session - 05/30/17 1445    Visit Number 12   Number of Visits 16   Date for PT Re-Evaluation 06/10/17   Authorization Type Medicare / BCBS   PT Start Time 6962   PT Stop Time 1548   PT Time Calculation (min) 63 min   Activity Tolerance Patient tolerated treatment well   Behavior During Therapy [facility] for tasks assessed/performed      Past Medical History:  Diagnosis Date  . Abdominal aortic aneurysm ([place]) 09/2008   s/o endovascular repair and angiogram 10/04/08   . Anemia    EGD Cscope 08-2012  . Bleeding ulcer 1997  . CKD (chronic kidney disease), stage IV (HCC)    Stable, Dr. Dimas Aguas  . COPD (chronic obstructive pulmonary disease) ([place])   . H/O hyperkalemia    Stable  . Hypertension   . Leukopenia 2/11   thought to be from doxy  . Renal cell carcinoma    clear cell type dx 10/09, s/p excision 10-24-08  . Renal insufficiency   . Solitary left kidney    W/ chronic tubule-interstitial damage, Dr. Dimas Aguas  . Spinal stenosis    causing LE weakness-persistant, being worked up by Hydrographic surveyor and neurologist    Past Surgical History:  Procedure Laterality Date  . ABDOMINAL AORTIC ANEURYSM REPAIR  2009   EVAR  . [place]  . [place] ([place]), 2004  . NEPHRECTOMY  2009   Open nephrectomy for cancer    There were no vitals filed for this visit.      Subjective Assessment - 05/30/17 1450    Subjective Pt denies pain, but states "feel tired all over".   Patient Stated Goals "Walk good"   Currently in Pain? No/denies            [facility] PT Assessment - 05/30/17 1445      Strength   Overall Strength Comments tested in sitting   Right Hip Flexion 4-/5   Right Hip Extension 4/5   Right Hip External Rotation  4-/5   Right Hip Internal Rotation 4/5   Right Hip ABduction 4/5   Right Hip ADduction 4-/5   Left Hip Flexion 4-/5   Left Hip Extension 4/5   Left Hip External Rotation 4-/5   Left Hip Internal Rotation 4/5   Left Hip ABduction 4/5   Left Hip ADduction 4-/5   Right Knee Flexion 4/5   Right Knee Extension 4+/5   Left Knee Flexion 4/5   Left Knee Extension 4+/5   Right Ankle Dorsiflexion 4/5   Left Ankle Dorsiflexion 4/5                     [facility] Adult PT Treatment/Exercise - 05/30/17 1445      Ambulation/Gait   Ambulation/Gait Assistance 5: Supervision   Ambulation/Gait Assistance Details promoted carryover of PWR! step/walk to increase step length, foot clearance and reciprocal srm swing   Ambulation Distance (Feet) 180 Feet   Assistive device None   Gait Pattern Step-through pattern;Decreased arm swing - right;Decreased arm swing - left;Decreased stride length;Trunk flexed;Right foot flat;Left foot  flat   Ambulation Surface Level;Indoor     Lumbar Exercises: Standing   Other Standing Lumbar Exercises Trunk extension into pool noodle on wall + scap squeeze x15, PT overpressure for first 5 reps     Knee/Hip Exercises: Aerobic   Nustep lvl 5 x 6'     Knee/Hip Exercises: Seated   Ball Squeeze Hip ADD ball squeeze 15x3"   Clamshell with TheraBand Green  15x3"   Marching Both;15 reps   Marching Limitations looped green TB at knees           PWR [facility]) - 05/30/17 1515    PWR! exercises Functional moves   PWR! Step Through Forward/Back x10 each B isolating fwd & back; x20 combined fwd/back; 1 hand support on counter          Balance Exercises - 05/30/17 1445      OTAGO PROGRAM   Knee Bends 20 reps, support   Walking and Turning Around No assistive device   Tandem Stance --  20 sec, intermittent support   Tandem Walk  Support  intermittent support   One Leg Stand --  20 sec, intermittent support   Heel Walking No support  limited toe clearance   Toe Walk No support  limited heel clearance   Heel Toe Walking Backward --  Level C = Support   Sit to Stand 20 reps, one support           PT Education - 05/30/17 1551    Education provided Yes   Education Details PWR! Fwd Step/Walk, seated LE exercises, & community resources for fall prevention in the elderly   Person(s) Educated Patient;Child(ren)   Methods Explanation;Demonstration;Handout   Comprehension Verbalized understanding          PT Short Term Goals - 05/24/17 0854      PT SHORT TERM GOAL #1   Title Assess dynamic gait stability per DGI or FGA by 04/22/17   Status Achieved     PT SHORT TERM GOAL #2   Title Independent with OTAGO Fall Prevention Program HEP at supported level by 05/13/17   Status Achieved           PT Long Term Goals - 05/26/17 0813      PT LONG TERM GOAL #1   Title Pt will be independent with OTAGO fall prevention HEP at unsupported level or other advanced balance HEP by 06/10/17   Status On-going     PT LONG TERM GOAL #2   Title B LE strength to >/= 4/5 for improved function by 06/10/17   Status On-going     PT LONG TERM GOAL #3   Title Pt will demonstrate improvement on the Berg Balance Scale to 48/56 or greater to reduce risk for falls by 06/10/17   Status On-going     PT LONG TERM GOAL #4   Title Pt will improve gait speed to 2.6 ft/sec or greater for improved safety with community ambulation by 06/10/17   Status On-going               Plan - 05/30/17 1452    Clinical Impression Statement Pt continues to have lingering fatigue/increased "tired all over" since recent hospitalization with increased stooped posture and more pronounced shuffling gait. Reassessed LE strength with overall improvment noted since initial eval but continued weakness noted more proxmially, therefore provided updated  HEP targeting areas of greatest weakness. Reviewed postural awareness/correction using pool noodle and PWR! Fwd Step/Walk to promote increased step length,  improved foot clearance and increased reciprocal arm swing to promote more normalized gait pattern. Lengthly discussion with pt's dtr regarding set-backs related to hospitalization and options for community based activities to increase activity outside of PT.   Clinical Impairments Affecting Rehab Potential Advanced age, Parkinson's disease, OA of mutiple joints, lumbar stenosis, LE neuropathy, HTN, COPD, CKD   PT Frequency --   PT Duration --   PT Treatment/Interventions Patient/family education;Neuromuscular re-education;Balance training;Therapeutic exercise;Therapeutic activities;Functional mobility training;Gait training;Manual techniques;Taping;Dry needling;Electrical Stimulation;Moist Heat;Cryotherapy;ADLs/Self Care Home Management   PT Next Visit Plan Review postural exercises & updated HEP; balance & dynamic gait activities: PWR! Moves as appropriate   Consulted and Agree with Plan of Care Patient;Family member/caregiver   Family Member Consulted Dtr - [name]      Patient will benefit from skilled therapeutic intervention in order to improve the following deficits and impairments:  Abnormal gait, Difficulty walking, Decreased activity tolerance, Decreased balance, Decreased coordination, Decreased safety awareness, Decreased strength, Impaired flexibility, Decreased range of motion, Postural dysfunction  Visit Diagnosis: Other abnormalities of gait and mobility  Difficulty in walking, not elsewhere classified  Unsteadiness on feet  Abnormal posture     Problem List Patient Active Problem List   Diagnosis Date Noted  . Status post dilatation of esophageal stricture 05/15/2017  . Pancytopenia ([place]) 03/31/2016  . Follow-up --- PCP NOTES 09/02/2015  . Neuropathy 06/10/2015  . Pedal edema 01/30/2015  . Hip pain, chronic  01/30/2015  . Back pain 01/30/2015  . Cramps of lower extremity 01/30/2015  . Aftercare following surgery of the circulatory system, [place] 08/05/2014  . Annual physical exam 10/19/2011  . DJD (degenerative joint disease) 12/14/2010  . Anemia 06/06/2009  . COPD (chronic obstructive pulmonary disease) ([place]) 06/06/2009  . RENAL CELL CANCER 12/25/2008  . HTN (hypertension) 12/25/2008  . AAA (abdominal aortic aneurysm) without rupture ([place]) 12/25/2008  . CKD (chronic kidney disease) 12/25/2008  . HYPERCHOLESTEROLEMIA 03/01/2008  . GERD 03/01/2008  . ACNE ROSACEA 11/29/2007  . DIVERTICULOSIS, COLON 04/12/2007    Percival Spanish, PT, MPT 05/30/2017, 5:00 PM  [facility] [address]  [place], [place], [address] Phone: [phone]   Fax:  [phone]  Name: DELOS KLICH MRN: [id number] Date of Birth: Jan 20, 1927

## 2017-06-02 ENCOUNTER — Ambulatory Visit: Payer: Medicare Other | Admitting: Physical Therapy

## 2017-06-02 DIAGNOSIS — R2689 Other abnormalities of gait and mobility: Secondary | ICD-10-CM | POA: Diagnosis not present

## 2017-06-02 DIAGNOSIS — R262 Difficulty in walking, not elsewhere classified: Secondary | ICD-10-CM

## 2017-06-02 DIAGNOSIS — R293 Abnormal posture: Secondary | ICD-10-CM | POA: Diagnosis not present

## 2017-06-02 DIAGNOSIS — R2681 Unsteadiness on feet: Secondary | ICD-10-CM | POA: Diagnosis not present

## 2017-06-02 NOTE — Therapy (Signed)
[place] [address]  [place], [place], [address] Phone: [phone]   Fax:  [phone]  Physical Therapy Treatment  Patient Details  Name: [name] MRN: [id number] Date of Birth: [birth year] Referring Provider: Jessy Oto, MD  Encounter Date: 06/02/2017      PT End of Session - 06/02/17 1305    Visit Number 13   Number of Visits 16   Date for PT Re-Evaluation 06/10/17   Authorization Type Medicare / BCBS   PT Start Time 1305   PT Stop Time 1352   PT Time Calculation (min) 47 min   Activity Tolerance Patient tolerated treatment well   Behavior During Therapy [facility] for tasks assessed/performed      Past Medical History:  Diagnosis Date  . Abdominal aortic aneurysm ([place]) 09/2008   s/o endovascular repair and angiogram 10/04/08   . Anemia    EGD Cscope 08-2012  . Bleeding ulcer 1997  . CKD (chronic kidney disease), stage IV (HCC)    Stable, Dr. Dimas Aguas  . COPD (chronic obstructive pulmonary disease) ([place])   . H/O hyperkalemia    Stable  . Hypertension   . Leukopenia 2/11   thought to be from doxy  . Renal cell carcinoma    clear cell type dx 10/09, s/p excision 10-24-08  . Renal insufficiency   . Solitary left kidney    W/ chronic tubule-interstitial damage, Dr. Dimas Aguas  . Spinal stenosis    causing LE weakness-persistant, being worked up by Hydrographic surveyor and neurologist    Past Surgical History:  Procedure Laterality Date  . ABDOMINAL AORTIC ANEURYSM REPAIR  2009   EVAR  . [place]  . [place] ([place]), 2004  . NEPHRECTOMY  2009   Open nephrectomy for cancer    There were no vitals filed for this visit.      Subjective Assessment - 06/02/17 1319    Subjective No pain, still just tired. "Sometimes feels like I'll give out".   Patient Stated Goals "Walk good"   Currently in Pain? No/denies                         [facility] Adult PT  Treatment/Exercise - 06/02/17 1305      Ambulation/Gait   Ambulation/Gait Assistance 5: Supervision   Ambulation/Gait Assistance Details promoted carryover of PWR! step/walk to increase step length, foot clearance and reciprocal srm swing   Ambulation Distance (Feet) 200 Feet   Assistive device None   Gait Pattern Step-through pattern;Decreased arm swing - right;Decreased arm swing - left;Decreased stride length;Trunk flexed;Right foot flat;Left foot flat   Ambulation Surface Level;Indoor     Lumbar Exercises: Aerobic   Tread Mill 1.0 mph x 6'     Lumbar Exercises: Standing   Other Standing Lumbar Exercises Trunk extension into pool noodle on wall + red TB horiz ABD/scap squeeze & alt shoulder flex/ext with yellow TB 15x3" each     Knee/Hip Exercises: Seated   Ball Squeeze Hip ADD ball squeeze 15x3"   Clamshell with TheraBand Green  15x3"   Marching Both;15 reps   Marching Limitations looped green TB at knees           PWR [facility]) - 06/02/17 1305    PWR! exercises Functional moves   PWR! Step Through Forward/Back x10 each B isolating fwd & back; x20 combined fwd/back; 1 hand support on counter;  side-step 2 x15 ft   PWR! Sit to Stand x10          Balance Exercises - 06/02/17 1305      Balance Exercises: Standing   Step Over Hurdles / Cones Stepping over balance bubbles, progressing to horizontal cones with sinlgel UE support on counter              PT Short Term Goals - 05/24/17 0854      PT SHORT TERM GOAL #1   Title Assess dynamic gait stability per DGI or FGA by 04/22/17   Status Achieved     PT SHORT TERM GOAL #2   Title Independent with OTAGO Fall Prevention Program HEP at supported level by 05/13/17   Status Achieved           PT Long Term Goals - 05/26/17 0813      PT LONG TERM GOAL #1   Title Pt will be independent with OTAGO fall prevention HEP at unsupported level or other advanced balance HEP by 06/10/17   Status On-going     PT LONG TERM  GOAL #2   Title B LE strength to >/= 4/5 for improved function by 06/10/17   Status On-going     PT LONG TERM GOAL #3   Title Pt will demonstrate improvement on the Berg Balance Scale to 48/56 or greater to reduce risk for falls by 06/10/17   Status On-going     PT LONG TERM GOAL #4   Title Pt will improve gait speed to 2.6 ft/sec or greater for improved safety with community ambulation by 06/10/17   Status On-going               Plan - 06/02/17 1321    Clinical Impression Statement Assessed pt safety on treadmill per dtr's request to determine if pt would be appropriate to resume using the treadmill at the gym - pt able to ambulate at 1.0-1.2 mph for 6 minutes, with fatigue noted by end of time but pt able to consistently demonstrate good foot clearance and step length. Reviewed standing postural exercises with pool noodle as well as HEP update from last session with pt able to demonstrate all exercises with only minor cueing. Conitnued to promote carryover of PWR! Walk patterns to help normalize gait pattern while promoting increased step length, improved foot clearance and increased reciprocal arm swing.   Clinical Impairments Affecting Rehab Potential Advanced age, Parkinson's disease, OA of mutiple joints, lumbar stenosis, LE neuropathy, HTN, COPD, CKD   PT Treatment/Interventions Patient/family education;Neuromuscular re-education;Balance training;Therapeutic exercise;Therapeutic activities;Functional mobility training;Gait training;Manual techniques;Taping;Dry needling;Electrical Stimulation;Moist Heat;Cryotherapy;ADLs/Self Care Home Management   PT Next Visit Plan balance & dynamic gait activities: PWR! Moves as appropriate   Consulted and Agree with Plan of Care Patient;Family member/caregiver   Family Member Consulted Dtr - [name]      Patient will benefit from skilled therapeutic intervention in order to improve the following deficits and impairments:  Abnormal gait, Difficulty  walking, Decreased activity tolerance, Decreased balance, Decreased coordination, Decreased safety awareness, Decreased strength, Impaired flexibility, Decreased range of motion, Postural dysfunction  Visit Diagnosis: Other abnormalities of gait and mobility  Difficulty in walking, not elsewhere classified  Unsteadiness on feet     Problem List Patient Active Problem List   Diagnosis Date Noted  . Status post dilatation of esophageal stricture 05/15/2017  . Pancytopenia ([place]) 03/31/2016  . Follow-up --- PCP NOTES 09/02/2015  . Neuropathy 06/10/2015  . Pedal edema 01/30/2015  . Hip  pain, chronic 01/30/2015  . Back pain 01/30/2015  . Cramps of lower extremity 01/30/2015  . Aftercare following surgery of the circulatory system, [place] 08/05/2014  . Annual physical exam 10/19/2011  . DJD (degenerative joint disease) 12/14/2010  . Anemia 06/06/2009  . COPD (chronic obstructive pulmonary disease) ([place]) 06/06/2009  . RENAL CELL CANCER 12/25/2008  . HTN (hypertension) 12/25/2008  . AAA (abdominal aortic aneurysm) without rupture ([place]) 12/25/2008  . CKD (chronic kidney disease) 12/25/2008  . HYPERCHOLESTEROLEMIA 03/01/2008  . GERD 03/01/2008  . ACNE ROSACEA 11/29/2007  . DIVERTICULOSIS, COLON 04/12/2007    Percival Spanish, PT, MPT 06/02/2017, 3:42 PM  [facility] [address]  [place], [place], [address] Phone: [phone]   Fax:  [phone]  Name: JORMA TASSINARI MRN: [id number] Date of Birth: [birth year]

## 2017-06-07 ENCOUNTER — Ambulatory Visit: Payer: Medicare Other | Admitting: Physical Therapy

## 2017-06-07 DIAGNOSIS — R293 Abnormal posture: Secondary | ICD-10-CM | POA: Diagnosis not present

## 2017-06-07 DIAGNOSIS — R2689 Other abnormalities of gait and mobility: Secondary | ICD-10-CM

## 2017-06-07 DIAGNOSIS — R2681 Unsteadiness on feet: Secondary | ICD-10-CM

## 2017-06-07 DIAGNOSIS — R262 Difficulty in walking, not elsewhere classified: Secondary | ICD-10-CM

## 2017-06-07 NOTE — Therapy (Signed)
[facility] [address]  [place], [place], [address] Phone: [phone]   Fax:  [phone]  Physical Therapy Treatment  Patient Details  Name: [name] MRN: [id number] Date of Birth: Jul 09, 1927 Referring Provider: Jessy Oto, MD  Encounter Date: 06/07/2017      PT End of Session - 06/07/17 1447    Visit Number 14   Number of Visits 16   Date for PT Re-Evaluation 06/10/17   Authorization Type Medicare / BCBS   PT Start Time 5102   PT Stop Time 1535   PT Time Calculation (min) 48 min   Activity Tolerance Patient tolerated treatment well   Behavior During Therapy [facility] for tasks assessed/performed      Past Medical History:  Diagnosis Date  . Abdominal aortic aneurysm ([place]) 09/2008   s/o endovascular repair and angiogram 10/04/08   . Anemia    EGD Cscope 08-2012  . Bleeding ulcer 1997  . CKD (chronic kidney disease), stage IV (HCC)    Stable, Dr. Dimas Aguas  . COPD (chronic obstructive pulmonary disease) ([place])   . H/O hyperkalemia    Stable  . Hypertension   . Leukopenia 2/11   thought to be from doxy  . Renal cell carcinoma    clear cell type dx 10/09, s/p excision 10-24-08  . Renal insufficiency   . Solitary left kidney    W/ chronic tubule-interstitial damage, Dr. Dimas Aguas  . Spinal stenosis    causing LE weakness-persistant, being worked up by Hydrographic surveyor and neurologist    Past Surgical History:  Procedure Laterality Date  . ABDOMINAL AORTIC ANEURYSM REPAIR  2009   EVAR  . [place]  . [place] ([place]), 2004  . NEPHRECTOMY  2009   Open nephrectomy for cancer    There were no vitals filed for this visit.      Subjective Assessment - 06/07/17 1458    Subjective No pain, just tired.   Patient Stated Goals "Walk good"   Currently in Pain? No/denies                         [facility] Adult PT Treatment/Exercise - 06/07/17 1447      Ambulation/Gait   Ambulation/Gait Assistance 5: Supervision   Ambulation/Gait Assistance Details promoted carryover of PWR! step/walk to increase step length, foot clearance and reciprocal srm swing   Ambulation Distance (Feet) 500 Feet   Assistive device None   Gait Pattern Step-through pattern;Decreased arm swing - right;Decreased arm swing - left;Decreased stride length;Trunk flexed;Right foot flat;Left foot flat   Gait Comments 2 seated rest breaks due to fatigue     Lumbar Exercises: Aerobic   Tread Mill 1.2 mph x 6'           PWR [facility]) - 06/07/17 1447    PWR! exercises Moves in sitting;Moves in standing;Functional moves   PWR! Up x20   PWR! Rock [employer] Twist x20   PWR Step x20   PWR! Up x20   PWR! Rock [employer] Twist x20   PWR! Step x20               PT Short Term Goals - 05/24/17 0854      PT SHORT TERM GOAL #1   Title Assess dynamic gait stability per DGI or FGA by 04/22/17   Status Achieved  PT SHORT TERM GOAL #2   Title Independent with OTAGO Fall Prevention Program HEP at supported level by 05/13/17   Status Achieved           PT Long Term Goals - 05/26/17 0813      PT LONG TERM GOAL #1   Title Pt will be independent with OTAGO fall prevention HEP at unsupported level or other advanced balance HEP by 06/10/17   Status On-going     PT LONG TERM GOAL #2   Title B LE strength to >/= 4/5 for improved function by 06/10/17   Status On-going     PT LONG TERM GOAL #3   Title Pt will demonstrate improvement on the Berg Balance Scale to 48/56 or greater to reduce risk for falls by 06/10/17   Status On-going     PT LONG TERM GOAL #4   Title Pt will improve gait speed to 2.6 ft/sec or greater for improved safety with community ambulation by 06/10/17   Status On-going               Plan - 06/07/17 1535    Clinical Impression Statement Pt continues to demonstrate increased fatigue since hospitalization for pneumonia, requiring increased  rest breaks. Reviewed seated and standing PWR! Moves with emphasis on uprirght posture and weight shift as well as PWR! Step/Walk to promote increased step length, improved foot clearance and normlaized gait pattern. Pt nearing end of current POC, therefore will reassess standardized testing and goals to determine if recert will be indicated or pt will be ready to transition to HEP.    Rehab Potential Good   Clinical Impairments Affecting Rehab Potential Advanced age, Parkinson's disease, OA of mutiple joints, lumbar stenosis, LE neuropathy, HTN, COPD, CKD   PT Treatment/Interventions Patient/family education;Neuromuscular re-education;Balance training;Therapeutic exercise;Therapeutic activities;Functional mobility training;Gait training;Manual techniques;Taping;Dry needling;Electrical Stimulation;Moist Heat;Cryotherapy;ADLs/Self Care Home Management   PT Next Visit Plan reassess standardized tests & goals to determine potential need for recert; balance & dynamic gait activities: PWR! Moves as appropriate   Consulted and Agree with Plan of Care Patient;Family member/caregiver   Family Member Consulted Dtr - [name]      Patient will benefit from skilled therapeutic intervention in order to improve the following deficits and impairments:  Abnormal gait, Difficulty walking, Decreased activity tolerance, Decreased balance, Decreased coordination, Decreased safety awareness, Decreased strength, Impaired flexibility, Decreased range of motion, Postural dysfunction  Visit Diagnosis: Other abnormalities of gait and mobility  Difficulty in walking, not elsewhere classified  Unsteadiness on feet     Problem List Patient Active Problem List   Diagnosis Date Noted  . Status post dilatation of esophageal stricture 05/15/2017  . Pancytopenia ([place]) 03/31/2016  . Follow-up --- PCP NOTES 09/02/2015  . Neuropathy 06/10/2015  . Pedal edema 01/30/2015  . Hip pain, chronic 01/30/2015  . Back pain 01/30/2015   . Cramps of lower extremity 01/30/2015  . Aftercare following surgery of the circulatory system, [place] 08/05/2014  . Annual physical exam 10/19/2011  . DJD (degenerative joint disease) 12/14/2010  . Anemia 06/06/2009  . COPD (chronic obstructive pulmonary disease) ([place]) 06/06/2009  . RENAL CELL CANCER 12/25/2008  . HTN (hypertension) 12/25/2008  . AAA (abdominal aortic aneurysm) without rupture ([place]) 12/25/2008  . CKD (chronic kidney disease) 12/25/2008  . HYPERCHOLESTEROLEMIA 03/01/2008  . GERD 03/01/2008  . ACNE ROSACEA 11/29/2007  . DIVERTICULOSIS, COLON 04/12/2007    Percival Spanish, PT, MPT 06/07/2017, 5:14 PM  [facility] [address]  [place], [place], [address] Phone: [phone]   Fax:  [phone]  Name: [name] MRN: [id number] Date of Birth: Jul 30, 1927

## 2017-06-09 ENCOUNTER — Ambulatory Visit: Payer: Medicare Other | Admitting: Physical Therapy

## 2017-06-09 DIAGNOSIS — R2689 Other abnormalities of gait and mobility: Secondary | ICD-10-CM | POA: Diagnosis not present

## 2017-06-09 DIAGNOSIS — R2681 Unsteadiness on feet: Secondary | ICD-10-CM

## 2017-06-09 DIAGNOSIS — R293 Abnormal posture: Secondary | ICD-10-CM

## 2017-06-09 DIAGNOSIS — N183 Chronic kidney disease, stage 3 (moderate): Secondary | ICD-10-CM | POA: Diagnosis not present

## 2017-06-09 DIAGNOSIS — R262 Difficulty in walking, not elsewhere classified: Secondary | ICD-10-CM

## 2017-06-09 NOTE — Therapy (Signed)
[facility] [address]  [place], [place], [address] Phone: [phone]   Fax:  [phone]  Physical Therapy Treatment  Patient Details  Name: [name] MRN: [id number] Date of Birth: 1927-08-02 Referring Provider: Jessy Oto, MD  Encounter Date: 06/09/2017      PT End of Session - 06/09/17 1446    Visit Number 15   Number of Visits 22   Date for PT Re-Evaluation 08/05/17   Authorization Type Medicare / BCBS   PT Start Time 7169   PT Stop Time 1537   PT Time Calculation (min) 51 min   Activity Tolerance Patient tolerated treatment well   Behavior During Therapy [facility] for tasks assessed/performed      Past Medical History:  Diagnosis Date  . Abdominal aortic aneurysm ([place]) 09/2008   s/o endovascular repair and angiogram 10/04/08   . Anemia    EGD Cscope 08-2012  . Bleeding ulcer 1997  . CKD (chronic kidney disease), stage IV (HCC)    Stable, Dr. Dimas Aguas  . COPD (chronic obstructive pulmonary disease) ([place])   . H/O hyperkalemia    Stable  . Hypertension   . Leukopenia 2/11   thought to be from doxy  . Renal cell carcinoma    clear cell type dx 10/09, s/p excision 10-24-08  . Renal insufficiency   . Solitary left kidney    W/ chronic tubule-interstitial damage, Dr. Dimas Aguas  . Spinal stenosis    causing LE weakness-persistant, being worked up by Hydrographic surveyor and neurologist    Past Surgical History:  Procedure Laterality Date  . ABDOMINAL AORTIC ANEURYSM REPAIR  2009   EVAR  . [place]  . [place] ([place]), 2004  . NEPHRECTOMY  2009   Open nephrectomy for cancer    There were no vitals filed for this visit.      Subjective Assessment - 06/09/17 1449    Subjective No new complaints or concerns. "Just tired."   Patient Stated Goals "Walk good"   Currently in Pain? No/denies            [facility] PT Assessment - 06/09/17 1446      Assessment   Medical Diagnosis Unsteady gait   Referring Provider Jessy Oto, MD   Onset Date/Surgical Date --  ~6 months   Next MD Visit 09/30/17 with Dr. Louanne Skye; 11/02/17 with [name] (PCP)     Prior Function   Level of Independence Independent with basic ADLs;Independent with household mobility without device;Independent with community mobility without device;Needs assistance with homemaking   Vocation Retired   Leisure walking, working out at gym     Ambulation/Gait   Gait velocity 2.42 ft/sec     Standardized Balance Assessment   Standardized Balance Assessment Berg Balance Test;10 meter walk test;Dynamic Gait Index;Timed Up and Go Test   10 Meter Walk 13.53"     Berg Balance Test   Sit to Stand Able to stand without using hands and stabilize independently   Standing Unsupported Able to stand safely 2 minutes   Sitting with Back Unsupported but Feet Supported on Floor or Stool Able to sit safely and securely 2 minutes   Stand to Sit Controls descent by using hands   Transfers Able to transfer safely, minor use of hands   Standing Unsupported with Eyes Closed Able to stand 10 seconds safely   Standing Ubsupported with Feet Together Able to place  feet together independently and stand 1 minute safely   From Standing, Reach Forward with Outstretched Arm Can reach forward >12 cm safely (5")   From Standing Position, Pick up Object from [place] to pick up shoe, needs supervision   From Standing Position, Turn to Look Behind Over each Shoulder Looks behind one side only/other side shows less weight shift   Turn 360 Degrees Able to turn 360 degrees safely but slowly   Standing Unsupported, Alternately Place Feet on Step/Stool Able to stand independently and complete 8 steps >20 seconds   Standing Unsupported, One Foot in Front Able to take small step independently and hold 30 seconds   Standing on One Leg Tries to lift leg/unable to hold 3 seconds but remains standing independently   Total Score  44   Berg comment: 37-45 significant fall risk (>80%)      Dynamic Gait Index   Level Surface Mild Impairment   Change in Gait Speed Mild Impairment   Gait with Horizontal Head Turns Mild Impairment   Gait with Vertical Head Turns Moderate Impairment   Gait and Pivot Turn Mild Impairment   Step Over Obstacle Moderate Impairment   Step Around Obstacles Mild Impairment   Steps Moderate Impairment  reciprocal ascent, step-to descent   Total Score 13   DGI comment: Scores of 19 or less are predictive of falls in older community living adults     Timed Up and Go Test   Normal TUG (seconds) 12.6   Manual TUG (seconds) 18.65   Cognitive TUG (seconds) 25.25                     [facility] Adult PT Treatment/Exercise - 06/09/17 1446      Knee/Hip Exercises: Aerobic   Nustep lvl 6 x 6'                  PT Short Term Goals - 05/24/17 0854      PT SHORT TERM GOAL #1   Title Assess dynamic gait stability per DGI or FGA by 04/22/17   Status Achieved     PT SHORT TERM GOAL #2   Title Independent with OTAGO Fall Prevention Program HEP at supported level by 05/13/17   Status Achieved           PT Long Term Goals - 06/09/17 1451      PT LONG TERM GOAL #1   Title Pt will be independent with OTAGO fall prevention HEP at unsupported level or other advanced balance HEP by 08/05/17   Status On-going     PT LONG TERM GOAL #2   Title B LE strength to >/= 4/5 for improved function by 08/05/17   Status On-going     PT LONG TERM GOAL #3   Title Pt will demonstrate improvement on the Berg Balance Scale to 48/56 or greater to reduce risk for falls by 08/05/17   Status On-going  Berg = 44/56     PT LONG TERM GOAL #4   Title Pt will improve gait speed to 2.6 ft/sec or greater for improved safety with community ambulation by 08/05/17   Status On-going  2.42 ft/sec               Plan - 06/09/17 1450    Clinical Impression Statement Pt has demonstrated good progress  since initial eval with improvements noted in all standardized testing (gait speed increased from 1.81 ft/sec to 2.42 ft/sec, all versions of TUG time  decreased with normal TUG now 12.6", Berg increased from 31/56 to 44/56, and DGI increased from 10/24 to 13/24), although overall progress has been slower than anticipated due to complications of hospital admission for pneumonia which has left pt more tired and quick to fatigue during more recent therapy sessions. Progress and current status discussed with pt and daughter, [name], and given pt's potential for further improvement, will plan to recert for 6 additonal visits in addition to 1 remaining visit in existing POC while reducing frequency to 1x/wk. During this period will facilitate transition back to gym (with hopeful ability to participate in [place] based senior exercise group) and ongoing HEP - pt and dtr in agreement with updated POC.   Rehab Potential Good   Clinical Impairments Affecting Rehab Potential Advanced age, Parkinson's disease, OA of mutiple joints, lumbar stenosis, LE neuropathy, HTN, COPD, CKD   PT Treatment/Interventions Patient/family education;Neuromuscular re-education;Balance training;Therapeutic exercise;Therapeutic activities;Functional mobility training;Gait training;Manual techniques;Taping;Dry needling;Electrical Stimulation;Moist Heat;Cryotherapy;ADLs/Self Care Home Management   PT Next Visit Plan balance & dynamic gait activities; PWR! Moves as appropriate; gym program exercises/machines   Consulted and Agree with Plan of Care Patient;Family member/caregiver   Family Member Consulted Dtr - [name]      Patient will benefit from skilled therapeutic intervention in order to improve the following deficits and impairments:  Abnormal gait, Difficulty walking, Decreased activity tolerance, Decreased balance, Decreased coordination, Decreased safety awareness, Decreased strength, Impaired flexibility, Decreased range of  motion, Postural dysfunction  Visit Diagnosis: Other abnormalities of gait and mobility  Difficulty in walking, not elsewhere classified  Unsteadiness on feet  Abnormal posture     Problem List Patient Active Problem List   Diagnosis Date Noted  . Status post dilatation of esophageal stricture 05/15/2017  . Pancytopenia ([place]) 03/31/2016  . Follow-up --- PCP NOTES 09/02/2015  . Neuropathy 06/10/2015  . Pedal edema 01/30/2015  . Hip pain, chronic 01/30/2015  . Back pain 01/30/2015  . Cramps of lower extremity 01/30/2015  . Aftercare following surgery of the circulatory system, [place] 08/05/2014  . Annual physical exam 10/19/2011  . DJD (degenerative joint disease) 12/14/2010  . Anemia 06/06/2009  . COPD (chronic obstructive pulmonary disease) ([place]) 06/06/2009  . RENAL CELL CANCER 12/25/2008  . HTN (hypertension) 12/25/2008  . AAA (abdominal aortic aneurysm) without rupture ([place]) 12/25/2008  . CKD (chronic kidney disease) 12/25/2008  . HYPERCHOLESTEROLEMIA 03/01/2008  . GERD 03/01/2008  . ACNE ROSACEA 11/29/2007  . DIVERTICULOSIS, COLON 04/12/2007    Percival Spanish, PT, MPT 06/09/2017, 8:02 PM  [facility] [address]  [place], [place], [address] Phone: [phone]   Fax:  [phone]  Name: [name] MRN: [id number] Date of Birth: Jun 11, 1927

## 2017-06-14 ENCOUNTER — Ambulatory Visit: Payer: Medicare Other | Admitting: Physical Therapy

## 2017-06-14 DIAGNOSIS — R2689 Other abnormalities of gait and mobility: Secondary | ICD-10-CM | POA: Diagnosis not present

## 2017-06-14 DIAGNOSIS — R262 Difficulty in walking, not elsewhere classified: Secondary | ICD-10-CM | POA: Diagnosis not present

## 2017-06-14 DIAGNOSIS — R293 Abnormal posture: Secondary | ICD-10-CM | POA: Diagnosis not present

## 2017-06-14 DIAGNOSIS — R2681 Unsteadiness on feet: Secondary | ICD-10-CM

## 2017-06-14 NOTE — Therapy (Signed)
[facility] [address]  [place], [place], [address] Phone: [phone]   Fax:  [phone]  Physical Therapy Treatment  Patient Details  Name: [name] MRN: [id number] Date of Birth: 02/16/1927 Referring Provider: Jessy Oto, MD  Encounter Date: 06/14/2017      PT End of Session - 06/14/17 1440    Visit Number 16   Number of Visits 22   Date for PT Re-Evaluation 08/05/17   Authorization Type Medicare / BCBS   PT Start Time 1440   PT Stop Time 1525   PT Time Calculation (min) 45 min   Activity Tolerance Patient tolerated treatment well   Behavior During Therapy [facility] for tasks assessed/performed      Past Medical History:  Diagnosis Date  . Abdominal aortic aneurysm ([place]) 09/2008   s/o endovascular repair and angiogram 10/04/08   . Anemia    EGD Cscope 08-2012  . Bleeding ulcer 1997  . CKD (chronic kidney disease), stage IV (HCC)    Stable, Dr. Dimas Aguas  . COPD (chronic obstructive pulmonary disease) ([place])   . H/O hyperkalemia    Stable  . Hypertension   . Leukopenia 2/11   thought to be from doxy  . Renal cell carcinoma    clear cell type dx 10/09, s/p excision 10-24-08  . Renal insufficiency   . Solitary left kidney    W/ chronic tubule-interstitial damage, Dr. Dimas Aguas  . Spinal stenosis    causing LE weakness-persistant, being worked up by Hydrographic surveyor and neurologist    Past Surgical History:  Procedure Laterality Date  . ABDOMINAL AORTIC ANEURYSM REPAIR  2009   EVAR  . [place]  . [place] ([place]), 2004  . NEPHRECTOMY  2009   Open nephrectomy for cancer    There were no vitals filed for this visit.      Subjective Assessment - 06/14/17 1444    Subjective Pt dtr reports she has been checking in to local senior focused exercise groups/programs and is awaiting call-backs for details for a few local places.   Patient Stated Goals "Walk good"    Currently in Pain? No/denies                         [facility] Adult PT Treatment/Exercise - 06/14/17 1440      Lumbar Exercises: Standing   Other Standing Lumbar Exercises orange Pball overhead lift + trunk extension - passing ball to PT standing on mat table x10     Lumbar Exercises: Seated   Other Seated Lumbar Exercises seated on green Pball - green TB row & shoulder extension with emphasis on upright posture & scap retraction 15x3"   Other Seated Lumbar Exercises B UE D2 flexion with yellow TB x10, emphasis on upright posture     Knee/Hip Exercises: Aerobic   Nustep lvl 6 x 6'             Balance Exercises - 06/14/17 1440      Balance Exercises: Standing   SLS Eyes open;Solid surface  kicking ball   Step Over Hurdles / Cones Stepping over horizontal cones - no UE support   Other Standing Exercises B SLS cone knock down & righting x9     Balance Exercises: Seated   Dynamic Sitting Eyes opened;No upper extremity support;Anterior/posterior weight shift;Lateral weight shift;Reaching outside base of support;Head turns  seated on green  Pball - cone transfer posterior/lateral             PT Short Term Goals - 05/24/17 0854      PT SHORT TERM GOAL #1   Title Assess dynamic gait stability per DGI or FGA by 04/22/17   Status Achieved     PT SHORT TERM GOAL #2   Title Independent with OTAGO Fall Prevention Program HEP at supported level by 05/13/17   Status Achieved           PT Long Term Goals - 06/09/17 1451      PT LONG TERM GOAL #1   Title Pt will be independent with OTAGO fall prevention HEP at unsupported level or other advanced balance HEP by 08/05/17   Status On-going     PT LONG TERM GOAL #2   Title B LE strength to >/= 4/5 for improved function by 08/05/17   Status On-going     PT LONG TERM GOAL #3   Title Pt will demonstrate improvement on the Berg Balance Scale to 48/56 or greater to reduce risk for falls by 08/05/17   Status On-going   Berg = 44/56     PT LONG TERM GOAL #4   Title Pt will improve gait speed to 2.6 ft/sec or greater for improved safety with community ambulation by 08/05/17   Status On-going  2.42 ft/sec               Plan - 06/14/17 1506    Clinical Impression Statement Pt's dtr reports looking into local senior based and [place] exercise programs, but has yet to narrow it down to one. Today's treatment continued to focus on core stability and trunk extension for improved posture including dynamic weight shift and reaching activities, along with standing balance activities targeting weight shift and SL stability. Pt able to demostrate improved upright posture with dynamic activities.   Rehab Potential Good   Clinical Impairments Affecting Rehab Potential Advanced age, Parkinson's disease, OA of mutiple joints, lumbar stenosis, LE neuropathy, HTN, COPD, CKD   PT Treatment/Interventions Patient/family education;Neuromuscular re-education;Balance training;Therapeutic exercise;Therapeutic activities;Functional mobility training;Gait training;Manual techniques;Taping;Dry needling;Electrical Stimulation;Moist Heat;Cryotherapy;ADLs/Self Care Home Management   PT Next Visit Plan balance & dynamic gait activities; PWR! Moves as appropriate; gym program exercises/machines   Consulted and Agree with Plan of Care Patient;Family member/caregiver   Family Member Consulted Dtr - [name]      Patient will benefit from skilled therapeutic intervention in order to improve the following deficits and impairments:  Abnormal gait, Difficulty walking, Decreased activity tolerance, Decreased balance, Decreased coordination, Decreased safety awareness, Decreased strength, Impaired flexibility, Decreased range of motion, Postural dysfunction  Visit Diagnosis: Other abnormalities of gait and mobility  Difficulty in walking, not elsewhere classified  Unsteadiness on feet     Problem List Patient Active Problem  List   Diagnosis Date Noted  . Status post dilatation of esophageal stricture 05/15/2017  . Pancytopenia ([place]) 03/31/2016  . Follow-up --- PCP NOTES 09/02/2015  . Neuropathy 06/10/2015  . Pedal edema 01/30/2015  . Hip pain, chronic 01/30/2015  . Back pain 01/30/2015  . Cramps of lower extremity 01/30/2015  . Aftercare following surgery of the circulatory system, [place] 08/05/2014  . Annual physical exam 10/19/2011  . DJD (degenerative joint disease) 12/14/2010  . Anemia 06/06/2009  . COPD (chronic obstructive pulmonary disease) ([place]) 06/06/2009  . RENAL CELL CANCER 12/25/2008  . HTN (hypertension) 12/25/2008  . AAA (abdominal aortic aneurysm) without rupture ([place]) 12/25/2008  . CKD (  chronic kidney disease) 12/25/2008  . HYPERCHOLESTEROLEMIA 03/01/2008  . GERD 03/01/2008  . ACNE ROSACEA 11/29/2007  . DIVERTICULOSIS, COLON 04/12/2007    Percival Spanish, PT, MPT 06/14/2017, 5:12 PM  [facility] [address]  [place], [place], [address] Phone: [phone]   Fax:  [phone]  Name: [name] MRN: [id number] Date of Birth: [birth year]

## 2017-06-21 ENCOUNTER — Ambulatory Visit: Payer: Medicare Other | Admitting: Physical Therapy

## 2017-06-21 DIAGNOSIS — R293 Abnormal posture: Secondary | ICD-10-CM | POA: Diagnosis not present

## 2017-06-21 DIAGNOSIS — R2681 Unsteadiness on feet: Secondary | ICD-10-CM | POA: Diagnosis not present

## 2017-06-21 DIAGNOSIS — R2689 Other abnormalities of gait and mobility: Secondary | ICD-10-CM | POA: Diagnosis not present

## 2017-06-21 DIAGNOSIS — R262 Difficulty in walking, not elsewhere classified: Secondary | ICD-10-CM

## 2017-06-21 NOTE — Therapy (Signed)
[facility] [address]  [place], [place], [address] Phone: [phone]   Fax:  [phone]  Physical Therapy Treatment  Patient Details  Name: [name] MRN: [id number] Date of Birth: Dec 05, 1927 Referring Provider: Jessy Oto, MD  Encounter Date: 06/21/2017      PT End of Session - 06/21/17 1530    Visit Number 17   Number of Visits 22   Date for PT Re-Evaluation 08/05/17   Authorization Type Medicare / BCBS   PT Start Time 5732   PT Stop Time 1621   PT Time Calculation (min) 51 min   Activity Tolerance Patient tolerated treatment well   Behavior During Therapy [facility] for tasks assessed/performed      Past Medical History:  Diagnosis Date  . Abdominal aortic aneurysm ([place]) 09/2008   s/o endovascular repair and angiogram 10/04/08   . Anemia    EGD Cscope 08-2012  . Bleeding ulcer 1997  . CKD (chronic kidney disease), stage IV (HCC)    Stable, Dr. Dimas Aguas  . COPD (chronic obstructive pulmonary disease) ([place])   . H/O hyperkalemia    Stable  . Hypertension   . Leukopenia 2/11   thought to be from doxy  . Renal cell carcinoma    clear cell type dx 10/09, s/p excision 10-24-08  . Renal insufficiency   . Solitary left kidney    W/ chronic tubule-interstitial damage, Dr. Dimas Aguas  . Spinal stenosis    causing LE weakness-persistant, being worked up by Hydrographic surveyor and neurologist    Past Surgical History:  Procedure Laterality Date  . ABDOMINAL AORTIC ANEURYSM REPAIR  2009   EVAR  . [place]  . [place] ([place]), 2004  . NEPHRECTOMY  2009   Open nephrectomy for cancer    There were no vitals filed for this visit.      Subjective Assessment - 06/21/17 1537    Subjective Pt with usual c/o "tired".   Patient Stated Goals "Walk good"   Currently in Pain? No/denies   Pain Score 0-No pain                         [facility] Adult PT  Treatment/Exercise - 06/21/17 1530      Transfers   Transfers --  Car transfers   Transfer Cueing Provided cues with transfers out of car to step fwd until past door, then turn to close door rather than trying to back away from door before closing door.     Lumbar Exercises: Standing   Other Standing Lumbar Exercises Trunk extension into pool noodle on wall + scap squeeze 15x5", PT overpressure for last 2 reps x10" hold     Knee/Hip Exercises: Aerobic   Nustep lvl 6 x 6'     Knee/Hip Exercises: Machines for Strengthening   Cybex Knee Extension 15# x10, 20# x10 B con/ecc   Cybex Knee Flexion 15# x10     Knee/Hip Exercises: Standing   Other Standing Knee Exercises B sidestepping & fwd monster walk with yellow TB along counter x4 each   Other Standing Knee Exercises Walking lunge along counter x4     Knee/Hip Exercises: Seated   Clamshell with TheraBand Green  15x3"   Other Seated Knee/Hip Exercises Fitter leg press (1 black/1 blue) x15 each   Marching Both;15 reps   Marching Limitations looped green TB at knees  Balance Exercises - 06/21/17 1530      Balance Exercises: Standing   Marching Limitations High stepping along counter x4             PT Short Term Goals - 05/24/17 0854      PT SHORT TERM GOAL #1   Title Assess dynamic gait stability per DGI or FGA by 04/22/17   Status Achieved     PT SHORT TERM GOAL #2   Title Independent with OTAGO Fall Prevention Program HEP at supported level by 05/13/17   Status Achieved           PT Long Term Goals - 06/09/17 1451      PT LONG TERM GOAL #1   Title Pt will be independent with OTAGO fall prevention HEP at unsupported level or other advanced balance HEP by 08/05/17   Status On-going     PT LONG TERM GOAL #2   Title B LE strength to >/= 4/5 for improved function by 08/05/17   Status On-going     PT LONG TERM GOAL #3   Title Pt will demonstrate improvement on the Berg Balance Scale to 48/56 or  greater to reduce risk for falls by 08/05/17   Status On-going  Berg = 44/56     PT LONG TERM GOAL #4   Title Pt will improve gait speed to 2.6 ft/sec or greater for improved safety with community ambulation by 08/05/17   Status On-going  2.42 ft/sec               Plan - 06/21/17 1545    Clinical Impression Statement Pt's dtr reports they are still undecided about gym based senior exercise program, but plan to try out a few local programs. Today's treatment focused on LE strengthening and stepping activities to improve gait stability while reinforcing upright posture. At dtr's request, provided education in safer transfers out of car to decreased risk of fall while trying to get clear of door.   Rehab Potential Good   Clinical Impairments Affecting Rehab Potential Advanced age, Parkinson's disease, OA of mutiple joints, lumbar stenosis, LE neuropathy, HTN, COPD, CKD   PT Treatment/Interventions Patient/family education;Neuromuscular re-education;Balance training;Therapeutic exercise;Therapeutic activities;Functional mobility training;Gait training;Manual techniques;Taping;Dry needling;Electrical Stimulation;Moist Heat;Cryotherapy;ADLs/Self Care Home Management   PT Next Visit Plan balance & dynamic gait activities; PWR! Moves as appropriate; gym program exercises/machines   Consulted and Agree with Plan of Care Patient;Family member/caregiver   Family Member Consulted Dtr - [name]      Patient will benefit from skilled therapeutic intervention in order to improve the following deficits and impairments:  Abnormal gait, Difficulty walking, Decreased activity tolerance, Decreased balance, Decreased coordination, Decreased safety awareness, Decreased strength, Impaired flexibility, Decreased range of motion, Postural dysfunction  Visit Diagnosis: Other abnormalities of gait and mobility  Difficulty in walking, not elsewhere classified  Unsteadiness on feet     Problem List Patient  Active Problem List   Diagnosis Date Noted  . Status post dilatation of esophageal stricture 05/15/2017  . Pancytopenia ([place]) 03/31/2016  . Follow-up --- PCP NOTES 09/02/2015  . Neuropathy 06/10/2015  . Pedal edema 01/30/2015  . Hip pain, chronic 01/30/2015  . Back pain 01/30/2015  . Cramps of lower extremity 01/30/2015  . Aftercare following surgery of the circulatory system, [place] 08/05/2014  . Annual physical exam 10/19/2011  . DJD (degenerative joint disease) 12/14/2010  . Anemia 06/06/2009  . COPD (chronic obstructive pulmonary disease) ([place]) 06/06/2009  . RENAL CELL CANCER 12/25/2008  . HTN (  hypertension) 12/25/2008  . AAA (abdominal aortic aneurysm) without rupture ([place]) 12/25/2008  . CKD (chronic kidney disease) 12/25/2008  . HYPERCHOLESTEROLEMIA 03/01/2008  . GERD 03/01/2008  . ACNE ROSACEA 11/29/2007  . DIVERTICULOSIS, COLON 04/12/2007    Percival Spanish, PT, MPT 06/21/2017, 4:45 PM  [facility] [address]  [place], [place], [address] Phone: [phone]   Fax:  [phone]  Name: SHAQUEL CHAVOUS MRN: [id number] Date of Birth: Jan 07, 1927

## 2017-06-23 DIAGNOSIS — Z905 Acquired absence of kidney: Secondary | ICD-10-CM | POA: Diagnosis not present

## 2017-06-23 DIAGNOSIS — N281 Cyst of kidney, acquired: Secondary | ICD-10-CM | POA: Diagnosis not present

## 2017-06-23 DIAGNOSIS — D61818 Other pancytopenia: Secondary | ICD-10-CM | POA: Diagnosis not present

## 2017-06-23 DIAGNOSIS — D72819 Decreased white blood cell count, unspecified: Secondary | ICD-10-CM | POA: Diagnosis not present

## 2017-06-23 DIAGNOSIS — D696 Thrombocytopenia, unspecified: Secondary | ICD-10-CM | POA: Diagnosis not present

## 2017-06-23 DIAGNOSIS — Z87891 Personal history of nicotine dependence: Secondary | ICD-10-CM | POA: Diagnosis not present

## 2017-06-27 ENCOUNTER — Ambulatory Visit (HOSPITAL_BASED_OUTPATIENT_CLINIC_OR_DEPARTMENT_OTHER)
Admission: RE | Admit: 2017-06-27 | Discharge: 2017-06-27 | Disposition: A | Discharge status: Home / Self Care | Payer: Medicare Other | Source: Ambulatory Visit | Attending: Internal Medicine | Admitting: Internal Medicine

## 2017-06-27 DIAGNOSIS — J181 Lobar pneumonia, unspecified organism: Secondary | ICD-10-CM | POA: Diagnosis not present

## 2017-06-27 DIAGNOSIS — J90 Pleural effusion, not elsewhere classified: Secondary | ICD-10-CM | POA: Insufficient documentation

## 2017-06-27 DIAGNOSIS — J189 Pneumonia, unspecified organism: Secondary | ICD-10-CM | POA: Diagnosis not present

## 2017-06-30 ENCOUNTER — Ambulatory Visit: Payer: Medicare Other | Attending: Surgery | Admitting: Physical Therapy

## 2017-06-30 DIAGNOSIS — R2689 Other abnormalities of gait and mobility: Secondary | ICD-10-CM | POA: Diagnosis not present

## 2017-06-30 DIAGNOSIS — R2681 Unsteadiness on feet: Secondary | ICD-10-CM | POA: Diagnosis not present

## 2017-06-30 DIAGNOSIS — R293 Abnormal posture: Secondary | ICD-10-CM | POA: Diagnosis not present

## 2017-06-30 DIAGNOSIS — R262 Difficulty in walking, not elsewhere classified: Secondary | ICD-10-CM | POA: Diagnosis not present

## 2017-06-30 NOTE — Therapy (Signed)
[facility] [address]  [name], [place], [address] Phone: [phone]   Fax:  [phone]  Physical Therapy Treatment  Patient Details  Name: [name] MRN: [id number] Date of Birth: 1927-08-16 Referring Provider: Jessy Oto, MD  Encounter Date: 06/30/2017      PT End of Session - 06/30/17 1520    Visit Number 18   Number of Visits 22   Date for PT Re-Evaluation 08/05/17   Authorization Type Medicare / BCBS   PT Start Time 1520   PT Stop Time 1609   PT Time Calculation (min) 49 min   Activity Tolerance Patient tolerated treatment well   Behavior During Therapy [facility] for tasks assessed/performed      Past Medical History:  Diagnosis Date  . Abdominal aortic aneurysm ([place]) 09/2008   s/o endovascular repair and angiogram 10/04/08   . Anemia    EGD Cscope 08-2012  . Bleeding ulcer 1997  . CKD (chronic kidney disease), stage IV (HCC)    Stable, Dr. Dimas Aguas  . COPD (chronic obstructive pulmonary disease) ([place])   . H/O hyperkalemia    Stable  . Hypertension   . Leukopenia 2/11   thought to be from doxy  . Renal cell carcinoma    clear cell type dx 10/09, s/p excision 10-24-08  . Renal insufficiency   . Solitary left kidney    W/ chronic tubule-interstitial damage, Dr. Dimas Aguas  . Spinal stenosis    causing LE weakness-persistant, being worked up by Hydrographic surveyor and neurologist    Past Surgical History:  Procedure Laterality Date  . ABDOMINAL AORTIC ANEURYSM REPAIR  2009   EVAR  . [place]  . [place] ([place]), 2004  . NEPHRECTOMY  2009   Open nephrectomy for cancer    There were no vitals filed for this visit.      Subjective Assessment - 06/30/17 1525    Subjective Pt with usual c/o "tired" but denies pain.   Patient Stated Goals "Walk good"   Currently in Pain? No/denies   Pain Score 0-No pain                         [facility] Adult  PT Treatment/Exercise - 06/30/17 1520      Ambulation/Gait   Ambulation/Gait Assistance 5: Supervision   Ambulation/Gait Assistance Details promoted carryover of PWR! step/walk to increase step length, foot clearance and reciprocal srm swing   Ambulation Distance (Feet) 300 Feet   Assistive device None   Gait Pattern Step-through pattern;Decreased arm swing - right;Decreased arm swing - left;Decreased stride length;Trunk flexed;Right foot flat;Left foot flat   Gait Comments 1 seated rest break due to fatigue     Lumbar Exercises: Standing   Other Standing Lumbar Exercises Trunk extension into pool noodle on wall + red TB horiz ABD/scap squeeze & alt shoulder flex/ext 10x3" each     Lumbar Exercises: Seated   Long Arc Quad on [place] Both;10 reps   Hip Flexion on [place] Both;10 reps   Other Seated Lumbar Exercises seated on green Pball - green TB row & shoulder extension with emphasis on upright posture & scap retraction 15x3"     Knee/Hip Exercises: Aerobic   Nustep lvl 6 x 6'           PWR [facility]) - 06/30/17 1520    PWR! exercises Functional moves   PWR! Step  Through Forward/Back x10 each B isolating fwd & back; x20 combined fwd/back; 1 hand support on counter; side-step 2 x15 ft   PWR! Reaching In standing;Multi-directional;With PWR! Step   Reaching Comments x10 to B targets on wall               PT Short Term Goals - 05/24/17 0854      PT SHORT TERM GOAL #1   Title Assess dynamic gait stability per DGI or FGA by 04/22/17   Status Achieved     PT SHORT TERM GOAL #2   Title Independent with OTAGO Fall Prevention Program HEP at supported level by 05/13/17   Status Achieved           PT Long Term Goals - 06/09/17 1451      PT LONG TERM GOAL #1   Title Pt will be independent with OTAGO fall prevention HEP at unsupported level or other advanced balance HEP by 08/05/17   Status On-going     PT LONG TERM GOAL #2   Title B LE strength to >/= 4/5 for improved function  by 08/05/17   Status On-going     PT LONG TERM GOAL #3   Title Pt will demonstrate improvement on the Berg Balance Scale to 48/56 or greater to reduce risk for falls by 08/05/17   Status On-going  Berg = 44/56     PT LONG TERM GOAL #4   Title Pt will improve gait speed to 2.6 ft/sec or greater for improved safety with community ambulation by 08/05/17   Status On-going  2.42 ft/sec               Plan - 06/30/17 1526    Clinical Impression Statement [name] remains motivated and an active participant in PT but continues to be limited by fatigue requiring frequent rest breaks. Continued focus on core strengthening and upright posture, promoting carryover into mobility and gait.   Rehab Potential Good   Clinical Impairments Affecting Rehab Potential Advanced age, Parkinson's disease, OA of mutiple joints, lumbar stenosis, LE neuropathy, HTN, COPD, CKD   PT Treatment/Interventions Patient/family education;Neuromuscular re-education;Balance training;Therapeutic exercise;Therapeutic activities;Functional mobility training;Gait training;Manual techniques;Taping;Dry needling;Electrical Stimulation;Moist Heat;Cryotherapy;ADLs/Self Care Home Management   PT Next Visit Plan balance & dynamic gait activities; PWR! Moves as appropriate; gym program exercises/machines   Consulted and Agree with Plan of Care Patient   Family Member Consulted --      Patient will benefit from skilled therapeutic intervention in order to improve the following deficits and impairments:  Abnormal gait, Difficulty walking, Decreased activity tolerance, Decreased balance, Decreased coordination, Decreased safety awareness, Decreased strength, Impaired flexibility, Decreased range of motion, Postural dysfunction  Visit Diagnosis: Other abnormalities of gait and mobility  Difficulty in walking, not elsewhere classified  Unsteadiness on feet  Abnormal posture     Problem List Patient Active Problem List    Diagnosis Date Noted  . Status post dilatation of esophageal stricture 05/15/2017  . Pancytopenia ([place]) 03/31/2016  . Follow-up --- PCP NOTES 09/02/2015  . Neuropathy 06/10/2015  . Pedal edema 01/30/2015  . Hip pain, chronic 01/30/2015  . Back pain 01/30/2015  . Cramps of lower extremity 01/30/2015  . Aftercare following surgery of the circulatory system, [place] 08/05/2014  . Annual physical exam 10/19/2011  . DJD (degenerative joint disease) 12/14/2010  . Anemia 06/06/2009  . COPD (chronic obstructive pulmonary disease) ([place]) 06/06/2009  . RENAL CELL CANCER 12/25/2008  . HTN (hypertension) 12/25/2008  . AAA (abdominal aortic aneurysm)  without rupture ([place]) 12/25/2008  . CKD (chronic kidney disease) 12/25/2008  . HYPERCHOLESTEROLEMIA 03/01/2008  . GERD 03/01/2008  . ACNE ROSACEA 11/29/2007  . DIVERTICULOSIS, COLON 04/12/2007    Percival Spanish, PT, MPT 06/30/2017, 5:28 PM  [facility] [address]  [place], [place], [address] Phone: [phone]   Fax:  [phone]  Name: [name] MRN: [id number] Date of Birth: 1927-04-17

## 2017-07-04 ENCOUNTER — Ambulatory Visit: Payer: Medicare Other | Admitting: Physical Therapy

## 2017-07-04 DIAGNOSIS — R2689 Other abnormalities of gait and mobility: Secondary | ICD-10-CM

## 2017-07-04 DIAGNOSIS — I10 Essential (primary) hypertension: Secondary | ICD-10-CM | POA: Diagnosis not present

## 2017-07-04 DIAGNOSIS — R293 Abnormal posture: Secondary | ICD-10-CM | POA: Diagnosis not present

## 2017-07-04 DIAGNOSIS — R262 Difficulty in walking, not elsewhere classified: Secondary | ICD-10-CM

## 2017-07-04 DIAGNOSIS — M1830 Unilateral post-traumatic osteoarthritis of first carpometacarpal joint, unspecified hand: Secondary | ICD-10-CM | POA: Diagnosis not present

## 2017-07-04 DIAGNOSIS — R2681 Unsteadiness on feet: Secondary | ICD-10-CM | POA: Diagnosis not present

## 2017-07-04 NOTE — Therapy (Signed)
[facility] [address]  [place], [place], [address] Phone: [phone]   Fax:  [phone]  Physical Therapy Treatment  Patient Details  Name: [name] MRN: [id number] Date of Birth: Aug 23, 1927 Referring Provider: Jessy Oto, MD  Encounter Date: 07/04/2017      PT End of Session - 07/04/17 1533    Visit Number 19   Number of Visits 22   Date for PT Re-Evaluation 08/05/17   Authorization Type Medicare / BCBS   PT Start Time 1533   PT Stop Time 1615   PT Time Calculation (min) 42 min   Activity Tolerance Patient tolerated treatment well   Behavior During Therapy [facility] for tasks assessed/performed      Past Medical History:  Diagnosis Date  . Abdominal aortic aneurysm ([place]) 09/2008   s/o endovascular repair and angiogram 10/04/08   . Anemia    EGD Cscope 08-2012  . Bleeding ulcer 1997  . CKD (chronic kidney disease), stage IV (HCC)    Stable, Dr. Dimas Aguas  . COPD (chronic obstructive pulmonary disease) ([place])   . H/O hyperkalemia    Stable  . Hypertension   . Leukopenia 2/11   thought to be from doxy  . Renal cell carcinoma    clear cell type dx 10/09, s/p excision 10-24-08  . Renal insufficiency   . Solitary left kidney    W/ chronic tubule-interstitial damage, Dr. Dimas Aguas  . Spinal stenosis    causing LE weakness-persistant, being worked up by Hydrographic surveyor and neurologist    Past Surgical History:  Procedure Laterality Date  . ABDOMINAL AORTIC ANEURYSM REPAIR  2009   EVAR  . [place]  . [place] ([place]), 2004  . NEPHRECTOMY  2009   Open nephrectomy for cancer    There were no vitals filed for this visit.      Subjective Assessment - 07/04/17 1540    Subjective Pt "doing good".   Patient Stated Goals "Walk good"   Currently in Pain? No/denies   Pain Score 0-No pain            [facility] PT Assessment - 07/04/17 1533      Observation/Other  Assessments   Focus on Therapeutic Outcomes (FOTO)  Neuromuscular disorder - 49% (51% limitation)                     [facility] Adult PT Treatment/Exercise - 07/04/17 1533      Ambulation/Gait   Ambulation/Gait Assistance 5: Supervision   Ambulation/Gait Assistance Details promoted carryover of PWR! step/walk to increase step length, foot clearance and reciprocal arm swing   Ambulation Distance (Feet) 210 Feet   Assistive device None   Gait Pattern Step-through pattern;Decreased arm swing - right;Decreased arm swing - left;Decreased stride length;Trunk flexed;Right foot flat;Left foot flat     Lumbar Exercises: Machines for Strengthening   Other Lumbar Machine Exercise BATCA row - narrow grip 20# 2x10     Knee/Hip Exercises: Aerobic   Recumbent Bike lvl 2 x 6'     Knee/Hip Exercises: Standing   Hip Flexion Both;10 reps;Knee straight;Stengthening   Hip Flexion Limitations yellow TB, UE support on back of chair   Other Standing Knee Exercises B toe clears to 9" step x10           PWR [facility]) - 07/04/17 1533    PWR! exercises Functional moves   PWR! Step Through  Forward/Back x20 each B isolating fwd & back; x20 combined fwd/back; 1 hand support on counter;; carryover into fwd/back gait along edge of counter          Balance Exercises - 07/04/17 1533      Balance Exercises: Standing   Standing, One Foot on a Step Foam/compliant surface  B toe clears to 9" step x10             PT Short Term Goals - 05/24/17 0854      PT SHORT TERM GOAL #1   Title Assess dynamic gait stability per DGI or FGA by 04/22/17   Status Achieved     PT SHORT TERM GOAL #2   Title Independent with OTAGO Fall Prevention Program HEP at supported level by 05/13/17   Status Achieved           PT Long Term Goals - 06/09/17 1451      PT LONG TERM GOAL #1   Title Pt will be independent with OTAGO fall prevention HEP at unsupported level or other advanced balance HEP by 08/05/17    Status On-going     PT LONG TERM GOAL #2   Title B LE strength to >/= 4/5 for improved function by 08/05/17   Status On-going     PT LONG TERM GOAL #3   Title Pt will demonstrate improvement on the Berg Balance Scale to 48/56 or greater to reduce risk for falls by 08/05/17   Status On-going  Berg = 44/56     PT LONG TERM GOAL #4   Title Pt will improve gait speed to 2.6 ft/sec or greater for improved safety with community ambulation by 08/05/17   Status On-going  2.42 ft/sec               Plan - 07/04/17 1553    Clinical Impression Statement Pt demonstrating improving carryover of large pattern movements from PWR! Move training and proximal LE strengthening into gait with improving foot clearance and step length while maintaining upright posture.   Rehab Potential Good   Clinical Impairments Affecting Rehab Potential Advanced age, Parkinson's disease, OA of mutiple joints, lumbar stenosis, LE neuropathy, HTN, COPD, CKD   PT Treatment/Interventions Patient/family education;Neuromuscular re-education;Balance training;Therapeutic exercise;Therapeutic activities;Functional mobility training;Gait training;Manual techniques;Taping;Dry needling;Electrical Stimulation;Moist Heat;Cryotherapy;ADLs/Self Care Home Management   PT Next Visit Plan balance & dynamic gait activities; PWR! Moves as appropriate; gym program exercises/machines   Consulted and Agree with Plan of Care Patient      Patient will benefit from skilled therapeutic intervention in order to improve the following deficits and impairments:  Abnormal gait, Difficulty walking, Decreased activity tolerance, Decreased balance, Decreased coordination, Decreased safety awareness, Decreased strength, Impaired flexibility, Decreased range of motion, Postural dysfunction  Visit Diagnosis: Other abnormalities of gait and mobility  Difficulty in walking, not elsewhere classified  Unsteadiness on feet  Abnormal  posture     Problem List Patient Active Problem List   Diagnosis Date Noted  . Status post dilatation of esophageal stricture 05/15/2017  . Pancytopenia ([place]) 03/31/2016  . Follow-up --- PCP NOTES 09/02/2015  . Neuropathy 06/10/2015  . Pedal edema 01/30/2015  . Hip pain, chronic 01/30/2015  . Back pain 01/30/2015  . Cramps of lower extremity 01/30/2015  . Aftercare following surgery of the circulatory system, [place] 08/05/2014  . Annual physical exam 10/19/2011  . DJD (degenerative joint disease) 12/14/2010  . Anemia 06/06/2009  . COPD (chronic obstructive pulmonary disease) ([place]) 06/06/2009  . RENAL CELL CANCER 12/25/2008  .  HTN (hypertension) 12/25/2008  . AAA (abdominal aortic aneurysm) without rupture ([place]) 12/25/2008  . CKD (chronic kidney disease) 12/25/2008  . HYPERCHOLESTEROLEMIA 03/01/2008  . GERD 03/01/2008  . ACNE ROSACEA 11/29/2007  . DIVERTICULOSIS, COLON 04/12/2007    Percival Spanish, PT, MPT 07/04/2017, 5:42 PM  [facility] [address]  [place], [place], [address] Phone: [phone]   Fax:  [phone]  Name: YAZEED PRYER MRN: [id number] Date of Birth: 1927-01-31

## 2017-07-05 DIAGNOSIS — N289 Disorder of kidney and ureter, unspecified: Secondary | ICD-10-CM | POA: Diagnosis not present

## 2017-07-06 DIAGNOSIS — F0280 Dementia in other diseases classified elsewhere without behavioral disturbance: Secondary | ICD-10-CM | POA: Diagnosis not present

## 2017-07-06 DIAGNOSIS — N189 Chronic kidney disease, unspecified: Secondary | ICD-10-CM | POA: Diagnosis not present

## 2017-07-06 DIAGNOSIS — I129 Hypertensive chronic kidney disease with stage 1 through stage 4 chronic kidney disease, or unspecified chronic kidney disease: Secondary | ICD-10-CM | POA: Diagnosis not present

## 2017-07-06 DIAGNOSIS — R600 Localized edema: Secondary | ICD-10-CM | POA: Diagnosis not present

## 2017-07-06 DIAGNOSIS — Z9181 History of falling: Secondary | ICD-10-CM | POA: Diagnosis not present

## 2017-07-06 DIAGNOSIS — F0390 Unspecified dementia without behavioral disturbance: Secondary | ICD-10-CM | POA: Diagnosis not present

## 2017-07-06 DIAGNOSIS — M7989 Other specified soft tissue disorders: Secondary | ICD-10-CM | POA: Diagnosis not present

## 2017-07-06 DIAGNOSIS — G20 Parkinson's disease: Secondary | ICD-10-CM | POA: Diagnosis not present

## 2017-07-06 DIAGNOSIS — G629 Polyneuropathy, unspecified: Secondary | ICD-10-CM | POA: Diagnosis not present

## 2017-07-06 DIAGNOSIS — J449 Chronic obstructive pulmonary disease, unspecified: Secondary | ICD-10-CM | POA: Diagnosis not present

## 2017-07-06 DIAGNOSIS — R269 Unspecified abnormalities of gait and mobility: Secondary | ICD-10-CM | POA: Diagnosis not present

## 2017-07-06 DIAGNOSIS — Z87891 Personal history of nicotine dependence: Secondary | ICD-10-CM | POA: Diagnosis not present

## 2017-07-06 DIAGNOSIS — Z79899 Other long term (current) drug therapy: Secondary | ICD-10-CM | POA: Diagnosis not present

## 2017-07-07 ENCOUNTER — Telehealth: Payer: Self-pay | Admitting: Internal Medicine

## 2017-07-07 DIAGNOSIS — I517 Cardiomegaly: Secondary | ICD-10-CM | POA: Diagnosis not present

## 2017-07-07 DIAGNOSIS — N184 Chronic kidney disease, stage 4 (severe): Secondary | ICD-10-CM | POA: Diagnosis not present

## 2017-07-07 DIAGNOSIS — I10 Essential (primary) hypertension: Secondary | ICD-10-CM | POA: Diagnosis not present

## 2017-07-07 DIAGNOSIS — Z905 Acquired absence of kidney: Secondary | ICD-10-CM | POA: Diagnosis not present

## 2017-07-07 DIAGNOSIS — N281 Cyst of kidney, acquired: Secondary | ICD-10-CM | POA: Diagnosis not present

## 2017-07-07 DIAGNOSIS — J449 Chronic obstructive pulmonary disease, unspecified: Secondary | ICD-10-CM | POA: Diagnosis not present

## 2017-07-07 DIAGNOSIS — J90 Pleural effusion, not elsewhere classified: Secondary | ICD-10-CM | POA: Diagnosis not present

## 2017-07-07 DIAGNOSIS — N390 Urinary tract infection, site not specified: Secondary | ICD-10-CM | POA: Diagnosis not present

## 2017-07-07 NOTE — Telephone Encounter (Signed)
Caller name:JULIE COPELAND  Relation to pt:  Daughter  Call back number: [phone]  Reason for call:  Daughter inquiring about  Imaging results and would like nurse to leave a detail message, please advise

## 2017-07-07 NOTE — Telephone Encounter (Signed)
Notes recorded by Damita Dunnings, [place] on 06/30/2017 at 8:17 AM EDT Results released to [place]. ------  Notes recorded by Colon Branch, MD on 06/29/2017 at 1:52 PM EDT Release these results to MyChart with attached comments  Shanon Brow, the chest x-ray shows no pneumonia. Good results

## 2017-07-07 NOTE — Telephone Encounter (Signed)
LMOM informing [name], [name] of x-ray results. Instructed to call if questions/concerns.

## 2017-07-09 DIAGNOSIS — Z8673 Personal history of transient ischemic attack (TIA), and cerebral infarction without residual deficits: Secondary | ICD-10-CM | POA: Diagnosis not present

## 2017-07-09 DIAGNOSIS — F0390 Unspecified dementia without behavioral disturbance: Secondary | ICD-10-CM | POA: Diagnosis not present

## 2017-07-09 DIAGNOSIS — I129 Hypertensive chronic kidney disease with stage 1 through stage 4 chronic kidney disease, or unspecified chronic kidney disease: Secondary | ICD-10-CM | POA: Diagnosis not present

## 2017-07-09 DIAGNOSIS — N184 Chronic kidney disease, stage 4 (severe): Secondary | ICD-10-CM | POA: Diagnosis not present

## 2017-07-09 DIAGNOSIS — R262 Difficulty in walking, not elsewhere classified: Secondary | ICD-10-CM | POA: Diagnosis not present

## 2017-07-09 DIAGNOSIS — G20 Parkinson's disease: Secondary | ICD-10-CM | POA: Diagnosis not present

## 2017-07-09 DIAGNOSIS — J440 Chronic obstructive pulmonary disease with acute lower respiratory infection: Secondary | ICD-10-CM | POA: Diagnosis not present

## 2017-07-09 DIAGNOSIS — J449 Chronic obstructive pulmonary disease, unspecified: Secondary | ICD-10-CM | POA: Diagnosis not present

## 2017-07-09 DIAGNOSIS — N281 Cyst of kidney, acquired: Secondary | ICD-10-CM | POA: Diagnosis not present

## 2017-07-09 DIAGNOSIS — N179 Acute kidney failure, unspecified: Secondary | ICD-10-CM | POA: Diagnosis not present

## 2017-07-09 DIAGNOSIS — Z87891 Personal history of nicotine dependence: Secondary | ICD-10-CM | POA: Diagnosis not present

## 2017-07-09 DIAGNOSIS — R2689 Other abnormalities of gait and mobility: Secondary | ICD-10-CM | POA: Diagnosis not present

## 2017-07-09 DIAGNOSIS — M79606 Pain in leg, unspecified: Secondary | ICD-10-CM | POA: Diagnosis not present

## 2017-07-09 DIAGNOSIS — K222 Esophageal obstruction: Secondary | ICD-10-CM | POA: Diagnosis not present

## 2017-07-09 DIAGNOSIS — G629 Polyneuropathy, unspecified: Secondary | ICD-10-CM | POA: Diagnosis not present

## 2017-07-09 DIAGNOSIS — R531 Weakness: Secondary | ICD-10-CM | POA: Diagnosis not present

## 2017-07-09 DIAGNOSIS — N189 Chronic kidney disease, unspecified: Secondary | ICD-10-CM | POA: Diagnosis not present

## 2017-07-09 DIAGNOSIS — Z6823 Body mass index (BMI) 23.0-23.9, adult: Secondary | ICD-10-CM | POA: Diagnosis not present

## 2017-07-09 DIAGNOSIS — D61818 Other pancytopenia: Secondary | ICD-10-CM | POA: Diagnosis not present

## 2017-07-09 DIAGNOSIS — J90 Pleural effusion, not elsewhere classified: Secondary | ICD-10-CM | POA: Diagnosis not present

## 2017-07-09 DIAGNOSIS — I639 Cerebral infarction, unspecified: Secondary | ICD-10-CM | POA: Diagnosis not present

## 2017-07-09 DIAGNOSIS — J189 Pneumonia, unspecified organism: Secondary | ICD-10-CM | POA: Diagnosis not present

## 2017-07-09 DIAGNOSIS — M6281 Muscle weakness (generalized): Secondary | ICD-10-CM | POA: Diagnosis not present

## 2017-07-10 DIAGNOSIS — J189 Pneumonia, unspecified organism: Secondary | ICD-10-CM | POA: Diagnosis not present

## 2017-07-10 DIAGNOSIS — R1310 Dysphagia, unspecified: Secondary | ICD-10-CM | POA: Diagnosis not present

## 2017-07-10 DIAGNOSIS — Z6823 Body mass index (BMI) 23.0-23.9, adult: Secondary | ICD-10-CM | POA: Diagnosis not present

## 2017-07-10 DIAGNOSIS — N186 End stage renal disease: Secondary | ICD-10-CM | POA: Diagnosis not present

## 2017-07-10 DIAGNOSIS — I10 Essential (primary) hypertension: Secondary | ICD-10-CM | POA: Diagnosis not present

## 2017-07-10 DIAGNOSIS — G20 Parkinson's disease: Secondary | ICD-10-CM | POA: Insufficient documentation

## 2017-07-11 ENCOUNTER — Telehealth: Payer: Self-pay | Admitting: *Deleted

## 2017-07-11 NOTE — Telephone Encounter (Signed)
Called patient and left message to return call

## 2017-07-12 ENCOUNTER — Telehealth: Payer: Self-pay | Admitting: Internal Medicine

## 2017-07-12 ENCOUNTER — Ambulatory Visit: Payer: Medicare Other | Admitting: Physical Therapy

## 2017-07-12 DIAGNOSIS — R2689 Other abnormalities of gait and mobility: Secondary | ICD-10-CM | POA: Diagnosis not present

## 2017-07-12 DIAGNOSIS — R2681 Unsteadiness on feet: Secondary | ICD-10-CM

## 2017-07-12 DIAGNOSIS — R293 Abnormal posture: Secondary | ICD-10-CM | POA: Diagnosis not present

## 2017-07-12 DIAGNOSIS — R262 Difficulty in walking, not elsewhere classified: Secondary | ICD-10-CM | POA: Diagnosis not present

## 2017-07-12 NOTE — Telephone Encounter (Signed)
Called patient and left message to return call

## 2017-07-12 NOTE — Telephone Encounter (Signed)
Needs TCM, hospital follow-up scheduled.

## 2017-07-12 NOTE — Telephone Encounter (Signed)
Discharge Summaries - in this encounter Valente Haynes, DO - 07/10/2017 1:11 PM EDT Formatting of this note may be different from the original. Physician Discharge Summary  Identifying Information:  [name] 1927-07-29 [id number]  Admit date: 07/09/2017  Discharge date: 07/10/2017   Discharge Service: General Medicine (MED)  Discharge Attending Physician: Valente Ekam, DO  Discharge to: Home with outpatient PT  Discharge Diagnoses: Principal Problem: Multifocal pneumonia, possibly due to aspiration Active Problems: Pancytopenia (CMS-HCC) Essential hypertension Chronic kidney disease (CKD), stage IV (severe) (CMS-HCC) Cough in adult Gait difficulty Parkinsonism (CMS-HCC) Resolved Problems: Generalized weakness

## 2017-07-12 NOTE — Therapy (Signed)
[facility] [address]  [address] [place], [place], [address] Phone: [phone]   Fax:  [phone]  Physical Therapy Treatment  Patient Details  Name: [name] MRN: [id number] Date of Birth: 11/10/1927 Referring Provider: Kerrin Champagne, MD  Encounter Date: 07/12/2017      PT End of Session - 07/12/17 1530    Visit Number 20   Number of Visits 22   Date for PT Re-Evaluation 08/05/17   Authorization Type Medicare / BCBS   PT Start Time 1530   PT Stop Time 1622   PT Time Calculation (min) 52 min   Activity Tolerance Patient tolerated treatment well   Behavior During Therapy [facility] for tasks assessed/performed      Past Medical History:  Diagnosis Date  . Abdominal aortic aneurysm (HCC) 09/2008   s/o endovascular repair and angiogram 10/04/08   . Anemia    EGD Cscope 08-2012  . Bleeding ulcer 1997  . CKD (chronic kidney disease), stage IV (HCC)    Stable, Dr. Rayna Sexton  . COPD (chronic obstructive pulmonary disease) (HCC)   . H/O hyperkalemia    Stable  . Hypertension   . Leukopenia 2/11   thought to be from doxy  . Renal cell carcinoma    clear cell type dx 10/09, s/p excision 10-24-08  . Renal insufficiency   . Solitary left kidney    W/ chronic tubule-interstitial damage, Dr. Rayna Sexton  . Spinal stenosis    causing LE weakness-persistant, being worked up by Physiological scientist and neurologist    Past Surgical History:  Procedure Laterality Date  . ABDOMINAL AORTIC ANEURYSM REPAIR  2009   EVAR  . HEMORRHOID SURGERY  1990  . MOHS SURGERY     1997 (lip), 2004  . NEPHRECTOMY  2009   Open nephrectomy for cancer    There were no vitals filed for this visit.      Subjective Assessment - 07/12/17 1530    Subjective Pt's dtr reports pt woke up on Sat unable to take a step. EMS called and pt taken to [facility] ER and admitted. Chest x-ray negative, but CT showed early signs of recurrent PNA therefore started  on antibiotics. Discharged home on Sun but is supposed to follow up for MBSS.   Patient Stated Goals "Walk good"            [facility] PT Assessment - 07/12/17 1530      Observation/Other Assessments   Focus on Therapeutic Outcomes (FOTO)  Neuromuscular disorder - 49% (51% limitation)     Strength   Overall Strength Comments tested in sitting   Right Hip Flexion 4-/5   Right Hip Extension 4/5   Right Hip External Rotation  4-/5   Right Hip Internal Rotation 4/5   Right Hip ABduction 4/5   Right Hip ADduction 4/5   Left Hip Flexion 4-/5   Left Hip Extension 4/5   Left Hip External Rotation 4-/5   Left Hip Internal Rotation 4/5   Left Hip ABduction 4/5   Left Hip ADduction 4/5   Right Knee Flexion 4/5   Right Knee Extension 4+/5   Left Knee Flexion 4+/5   Left Knee Extension 4+/5   Right Ankle Dorsiflexion 4/5   Left Ankle Dorsiflexion 4/5     Standardized Balance Assessment   Standardized Balance Assessment Berg Balance Test;10 meter walk test;Dynamic Gait Index;Timed Up and Go Test     Berg Balance Test   Sit to  Stand Able to stand without using hands and stabilize independently   Standing Unsupported Able to stand safely 2 minutes   Sitting with Back Unsupported but Feet Supported on Floor or Stool Able to sit safely and securely 2 minutes   Stand to Sit Controls descent by using hands   Transfers Able to transfer safely, minor use of hands   Standing Unsupported with Eyes Closed Able to stand 10 seconds with supervision   Standing Ubsupported with Feet Together Able to place feet together independently and stand for 1 minute with supervision   From Standing, Reach Forward with Outstretched Arm Can reach forward >12 cm safely (5")   From Standing Position, Pick up Object from [place] to pick up shoe safely and easily   From Standing Position, Turn to Look Behind Over each Shoulder Looks behind one side only/other side shows less weight shift   Turn 360 Degrees Able to  turn 360 degrees safely but slowly   Standing Unsupported, Alternately Place Feet on Step/Stool Able to stand independently and complete 8 steps >20 seconds   Standing Unsupported, One Foot in Front Able to take small step independently and hold 30 seconds   Standing on One Leg Tries to lift leg/unable to hold 3 seconds but remains standing independently   Total Score 43     Dynamic Gait Index   Level Surface Mild Impairment   Change in Gait Speed Mild Impairment   Gait with Horizontal Head Turns Mild Impairment   Gait with Vertical Head Turns Moderate Impairment   Gait and Pivot Turn Mild Impairment   Step Over Obstacle Moderate Impairment   Step Around Obstacles Mild Impairment   Steps Moderate Impairment  reciprocal ascent, step-to descent   Total Score 13     Timed Up and Go Test   Normal TUG (seconds) 15.03   Manual TUG (seconds) 23.41   Cognitive TUG (seconds) --  unable                     [facility] Adult PT Treatment/Exercise - 07/12/17 1530      Knee/Hip Exercises: Aerobic   Nustep lvl 6 x 6'                  PT Short Term Goals - 05/24/17 0854      PT SHORT TERM GOAL #1   Title Assess dynamic gait stability per DGI or FGA by 04/22/17   Status Achieved     PT SHORT TERM GOAL #2   Title Independent with OTAGO Fall Prevention Program HEP at supported level by 05/13/17   Status Achieved           PT Long Term Goals - 07/12/17 1553      PT LONG TERM GOAL #1   Title Pt will be independent with OTAGO fall prevention HEP at unsupported level or other advanced balance HEP by 08/05/17   Status On-going     PT LONG TERM GOAL #2   Title B LE strength to >/= 4/5 for improved function by 08/05/17   Status Partially Met     PT LONG TERM GOAL #3   Title Pt will demonstrate improvement on the Berg Balance Scale to 48/56 or greater to reduce risk for falls by 08/05/17   Status On-going  Berg = 43/56     PT LONG TERM GOAL #4   Title Pt will improve  gait speed to 2.6 ft/sec or greater for improved safety with  community ambulation by 08/05/17   Status On-going               Plan - 2017-08-07 1733    Clinical Impression Statement Pt s/p brief hospitalization over the weekend due to extreme weakness and inability to walk, presumably secondary to early signs of new R LL PNA. Pt to have MBSS study to r/o aspiration as source of PNA. This latest hospitalization has exacerbated the decline he experienced after the prior hospital stay for PNA with slight declines noted in Berg and TUG balance assessments, although MMT unchanged and slightly improved in some areas. Pt with 2 visits remaining in current POC and is reaching the initial Medicare cap level, therefore discussed options with dtr and will determine if recert indicated as of final visit.   Rehab Potential Good   Clinical Impairments Affecting Rehab Potential Advanced age, Parkinson's disease, OA of mutiple joints, lumbar stenosis, LE neuropathy, HTN, COPD, CKD   PT Treatment/Interventions Patient/family education;Neuromuscular re-education;Balance training;Therapeutic exercise;Therapeutic activities;Functional mobility training;Gait training;Manual techniques;Taping;Dry needling;Electrical Stimulation;Moist Heat;Cryotherapy;ADLs/Self Care Home Management   PT Next Visit Plan balance & dynamic gait activities; PWR! Moves as appropriate; gym program exercises/machines   Consulted and Agree with Plan of Care Patient      Patient will benefit from skilled therapeutic intervention in order to improve the following deficits and impairments:  Abnormal gait, Difficulty walking, Decreased activity tolerance, Decreased balance, Decreased coordination, Decreased safety awareness, Decreased strength, Impaired flexibility, Decreased range of motion, Postural dysfunction  Visit Diagnosis: Other abnormalities of gait and mobility  Difficulty in walking, not elsewhere classified  Unsteadiness on  feet  Abnormal posture       G-Codes - 08/07/17 1615    Functional Assessment Tool Used (Outpatient Only) Berg = 43/56 (23.2% limitation), FOTO = 49% (51% limitation) & clinical judgment   Functional Limitation Mobility: Walking and moving around   Mobility: Walking and Moving Around Current Status (V6122) At least 20 percent but less than 40 percent impaired, limited or restricted   Mobility: Walking and Moving Around Goal Status (E4975) At least 20 percent but less than 40 percent impaired, limited or restricted      Problem List Patient Active Problem List   Diagnosis Date Noted  . Status post dilatation of esophageal stricture 05/15/2017  . Pancytopenia ([place]) 03/31/2016  . Follow-up --- PCP NOTES 09/02/2015  . Neuropathy 06/10/2015  . Pedal edema 01/30/2015  . Hip pain, chronic 01/30/2015  . Back pain 01/30/2015  . Cramps of lower extremity 01/30/2015  . Aftercare following surgery of the circulatory system, [place] 08/05/2014  . Annual physical exam 10/19/2011  . DJD (degenerative joint disease) 12/14/2010  . Anemia 06/06/2009  . COPD (chronic obstructive pulmonary disease) ([place]) 06/06/2009  . RENAL CELL CANCER 12/25/2008  . HTN (hypertension) 12/25/2008  . AAA (abdominal aortic aneurysm) without rupture ([place]) 12/25/2008  . CKD (chronic kidney disease) 12/25/2008  . HYPERCHOLESTEROLEMIA 03/01/2008  . GERD 03/01/2008  . ACNE ROSACEA 11/29/2007  . DIVERTICULOSIS, COLON 04/12/2007    Percival Spanish, PT, MPT  2017-08-07, 5:56 PM  [facility] [address]  [place], [place], [address] Phone: [phone]   Fax:  [phone]  Name: [name] MRN: [id number] Date of Birth: 1927/06/18

## 2017-07-12 NOTE — Telephone Encounter (Signed)
Caller name: [name] Copland Relation to EZ:[id number] Call back number: [phone] Pharmacy:  Reason for call: pt was hospitalized on 7/14 and released on 07/10/17, daughter states it was for pneumonia, daughter states that she received a vm to call and schedule a follow up prior to the pt being hospitalized again. Daughter states that [name] main concern the last time he was hospitalized for pneumonia was to ensure pt was completing all of the antibiotics. Pt will be done with his meds on 7/22, daughter would like to know if pt needs to come in for a hospital follow up or can [name] put an order in for him to get another chest x ray.

## 2017-07-13 ENCOUNTER — Telehealth: Payer: Self-pay | Admitting: Internal Medicine

## 2017-07-13 ENCOUNTER — Telehealth: Payer: Self-pay

## 2017-07-13 DIAGNOSIS — R1310 Dysphagia, unspecified: Secondary | ICD-10-CM

## 2017-07-13 DIAGNOSIS — J189 Pneumonia, unspecified organism: Secondary | ICD-10-CM

## 2017-07-13 DIAGNOSIS — J449 Chronic obstructive pulmonary disease, unspecified: Secondary | ICD-10-CM

## 2017-07-13 NOTE — Telephone Encounter (Signed)
Concerns from TCM call: Daughter would like patient to have Chest XR or CT of chest to assure pneumonia has cleared.  Would also like to know if patient will need any other labs drawn on day of follow up other than what he is having drawn before this visit at Urologists office( BMP and Uric Acid) Will be bringing patient in early and would like labs drawn first if he needs to have any. Patient has an appointment after Hospital follow up and does not want to be late per daughter.  Advise patient's daughter: 1. He will need a chest x-ray in the near future. 2. If the hospital team recommended a barium swallow I will order it, if not we'll discuss that during the visit 3. Labs will be ordered at the time of the visit if needed.

## 2017-07-13 NOTE — Telephone Encounter (Signed)
Please advise 

## 2017-07-13 NOTE — Telephone Encounter (Signed)
Hospital follow up completed with patients daughter. Daughter would like to know if patient will need any other labs drawn on this day other than BMP and Uric acid which he will have done on Monday ordered  by another provider. Please advise.

## 2017-07-13 NOTE — Telephone Encounter (Signed)
Caller name:Carolyn Insurance claims handler Transitional Care Relationship to patient: Can be reached:[phone] Pharmacy:  Reason for call:Requesting a order for modified barium swallow, dx: difficulty swallowing, food stuck in throat. Requesting to have done at [facility]. Has hospital follow up scheduled for 07/19/17

## 2017-07-13 NOTE — Telephone Encounter (Signed)
Called daughter to inform her about Labs per [name]. Left message for return call.

## 2017-07-13 NOTE — Telephone Encounter (Signed)
07/13/17/KC  Hospital Follow Up  Transition Care Management Follow-up Telephone Call  ADMISSION DATE: 07/09/17  DISCHARGE DATE: 07/10/17   How have you been since you were released from the hospital?  Per daughter patient is doing ok. Getting better.   Do you understand why you were in the hospital? Yes per daughter   Do you understand the discharge instrcutions? Yes per daughter    Items Reviewed:  Medications reviewed: Reviewed   Allergies reviewed: Reviewed   Dietary changes reviewed: Low sodium Heart healthy   Referrals reviewed: Urology, PCP   Functional Questionnaire:   Activities of Daily Living (ADLs): Per daughter patient can perform all ADL's  Any patient concerns? Daughter would like patient to have Chest XR or CT of chest to assure pneumonia has cleared.  Would also like to know if patient will need any other labs drawn on day of follow up other than what he is having drawn before this visit at Urologists office( BMP and Uric Acid) Will be bringing patient in early and would like labs drawn first if he needs to have any. Patient has an appointment after Hospital follow up and does not want to be late per daughter.   Confirmed importance and date/time of follow-up visits scheduled: Yes   Confirmed with patient if condition begins to worsen call PCP or go to the ER. Yes    Patient was given the office number and encouragred to call back with questions or concerns. Yes

## 2017-07-13 NOTE — Telephone Encounter (Signed)
If he has a BMP and uric acid done, I won't order those; they need for other labs will be determine and the  time of the office visit

## 2017-07-14 ENCOUNTER — Other Ambulatory Visit (HOSPITAL_BASED_OUTPATIENT_CLINIC_OR_DEPARTMENT_OTHER): Payer: Self-pay | Admitting: Internal Medicine

## 2017-07-14 DIAGNOSIS — N2889 Other specified disorders of kidney and ureter: Secondary | ICD-10-CM

## 2017-07-14 NOTE — Telephone Encounter (Signed)
Order placed, informed referral coordinator to send to [facility] as stated below.

## 2017-07-14 NOTE — Telephone Encounter (Signed)
Spoke with Hoyle Sauer a nurse at the transitional care clinic at [facility].  The hospitalist ordered a barium swallow test, they just need a order. Will do, DX pneumonia, will fax orders to [phone]

## 2017-07-15 ENCOUNTER — Telehealth: Payer: Self-pay

## 2017-07-15 NOTE — Telephone Encounter (Signed)
Patient s daughter returned my call regarding labs for patient when he comes for Hospital follow up. Given detailed message per her request regarding [name] note dated 07/13/17. Advised to call back if she had questions.

## 2017-07-15 NOTE — Telephone Encounter (Signed)
[name] Gregary Signs returned nurse call. She would like a call back. She said that it is okay to leave a detailed vm if she doesn't answer.

## 2017-07-16 ENCOUNTER — Ambulatory Visit (HOSPITAL_BASED_OUTPATIENT_CLINIC_OR_DEPARTMENT_OTHER)
Admission: RE | Admit: 2017-07-16 | Discharge: 2017-07-16 | Disposition: A | Discharge status: Home / Self Care | Payer: Medicare Other | Source: Ambulatory Visit | Attending: Internal Medicine | Admitting: Internal Medicine

## 2017-07-16 ENCOUNTER — Encounter (HOSPITAL_BASED_OUTPATIENT_CLINIC_OR_DEPARTMENT_OTHER): Payer: Self-pay

## 2017-07-16 DIAGNOSIS — N2889 Other specified disorders of kidney and ureter: Secondary | ICD-10-CM

## 2017-07-18 ENCOUNTER — Other Ambulatory Visit (HOSPITAL_BASED_OUTPATIENT_CLINIC_OR_DEPARTMENT_OTHER): Payer: Self-pay | Admitting: Internal Medicine

## 2017-07-18 DIAGNOSIS — I10 Essential (primary) hypertension: Secondary | ICD-10-CM | POA: Diagnosis not present

## 2017-07-18 DIAGNOSIS — N281 Cyst of kidney, acquired: Secondary | ICD-10-CM

## 2017-07-18 DIAGNOSIS — N184 Chronic kidney disease, stage 4 (severe): Secondary | ICD-10-CM | POA: Diagnosis not present

## 2017-07-18 NOTE — Addendum Note (Signed)
Addended byDamita Dunnings D on: 07/18/2017 01:27 PM   Modules accepted: Orders

## 2017-07-18 NOTE — Telephone Encounter (Signed)
Do not see order for [id number] but order for [id number],will place.

## 2017-07-18 NOTE — Telephone Encounter (Signed)
[facility] called back to say they need a corrected order to complete Barium swallow test for patient tomorrow. The correct CPT code is [phone] which will cover the radiology part of it.   Call back number I [phone] if you have questions.

## 2017-07-19 ENCOUNTER — Ambulatory Visit: Payer: Medicare Other | Admitting: Physical Therapy

## 2017-07-19 ENCOUNTER — Ambulatory Visit (HOSPITAL_BASED_OUTPATIENT_CLINIC_OR_DEPARTMENT_OTHER)
Admission: RE | Admit: 2017-07-19 | Discharge: 2017-07-19 | Disposition: A | Discharge status: Home / Self Care | Payer: Medicare Other | Source: Ambulatory Visit | Attending: Internal Medicine | Admitting: Internal Medicine

## 2017-07-19 ENCOUNTER — Ambulatory Visit (INDEPENDENT_AMBULATORY_CARE_PROVIDER_SITE_OTHER): Payer: Medicare Other | Admitting: Internal Medicine

## 2017-07-19 ENCOUNTER — Encounter: Payer: Self-pay | Admitting: Internal Medicine

## 2017-07-19 VITALS — BP 162/61 | HR 63 | Temp 98.1°F | Ht 69.0 in | Wt 155.5 lb

## 2017-07-19 DIAGNOSIS — N281 Cyst of kidney, acquired: Secondary | ICD-10-CM | POA: Diagnosis present

## 2017-07-19 DIAGNOSIS — N183 Chronic kidney disease, stage 3 unspecified: Secondary | ICD-10-CM

## 2017-07-19 DIAGNOSIS — R1313 Dysphagia, pharyngeal phase: Secondary | ICD-10-CM | POA: Diagnosis not present

## 2017-07-19 DIAGNOSIS — R2681 Unsteadiness on feet: Secondary | ICD-10-CM

## 2017-07-19 DIAGNOSIS — R262 Difficulty in walking, not elsewhere classified: Secondary | ICD-10-CM

## 2017-07-19 DIAGNOSIS — R293 Abnormal posture: Secondary | ICD-10-CM

## 2017-07-19 DIAGNOSIS — I714 Abdominal aortic aneurysm, without rupture: Secondary | ICD-10-CM | POA: Diagnosis not present

## 2017-07-19 DIAGNOSIS — Z905 Acquired absence of kidney: Secondary | ICD-10-CM | POA: Insufficient documentation

## 2017-07-19 DIAGNOSIS — R2689 Other abnormalities of gait and mobility: Secondary | ICD-10-CM

## 2017-07-19 DIAGNOSIS — C642 Malignant neoplasm of left kidney, except renal pelvis: Secondary | ICD-10-CM | POA: Diagnosis not present

## 2017-07-19 DIAGNOSIS — I700 Atherosclerosis of aorta: Secondary | ICD-10-CM | POA: Diagnosis not present

## 2017-07-19 DIAGNOSIS — N189 Chronic kidney disease, unspecified: Secondary | ICD-10-CM | POA: Diagnosis not present

## 2017-07-19 DIAGNOSIS — Z8701 Personal history of pneumonia (recurrent): Secondary | ICD-10-CM | POA: Diagnosis not present

## 2017-07-19 DIAGNOSIS — J189 Pneumonia, unspecified organism: Secondary | ICD-10-CM | POA: Diagnosis not present

## 2017-07-19 NOTE — Progress Notes (Signed)
Subjective:    Patient ID: [name], male    DOB: Aug 29, 1927, 83 y.o.   MRN: [id number]  DOS:  07/19/2017 Type of visit - description : TCM 14, here with his daughter Interval history:  Admitted to the hospital 07/09/2017, discharge the next day was admitted due to weakness, couldn't walk, was noted to have mild cough,at the ER a CT showed bilateral pleural effusion with right lung base consolidation, left lung base haziness concerning for pneumonia. The hospital team noted he had PNM in May,thus suspicious for aspiration, was Rx Zosyn, SLP consulted, after doing a negative bedside swallow test, they recommended a modified barium swallow as out pt. Patient improved, was discharged home on antibiotics. Labs in the last admission: A1c 4.9, LDL 94, total cholesterol 157, troponin was negative, UA negative. WBC 2.4, hemoglobin 10.8, platelet 87 (not far from baseline) TSH 3.7    Review of Systems  Had some cough prior to recent admission but that is gone. Since he left the hospital, he is feeling better, finished the antibiotics. He is back to normal. At this point denies fever chills No chest pain, difficulty breathing. Doing a low-salt diet, edema looks better than before. No nausea, vomiting, diarrhea.   Past Medical History:  Diagnosis Date  . Abdominal aortic aneurysm ([place]) 09/2008   s/o endovascular repair and angiogram 10/04/08   . Anemia    EGD Cscope 08-2012  . Bleeding ulcer 1997  . CKD (chronic kidney disease), stage IV (HCC)    Stable, Dr. Dimas Aguas  . COPD (chronic obstructive pulmonary disease) ([place])   . H/O hyperkalemia    Stable  . Hypertension   . Leukopenia 2/11   thought to be from doxy  . Renal cell carcinoma    clear cell type dx 10/09, s/p excision 10-24-08  . Renal insufficiency   . Solitary left kidney    W/ chronic tubule-interstitial damage, Dr. Dimas Aguas  . Spinal stenosis    causing LE weakness-persistant, being worked up by Licensed conveyancer and neurologist    Past Surgical History:  Procedure Laterality Date  . ABDOMINAL AORTIC ANEURYSM REPAIR  2009   EVAR  . [place]  . [place] (lip), 2004  . NEPHRECTOMY  2009   Open nephrectomy for cancer    Social History   Social History  . Marital status: Widowed    Spouse name: N/A  . Number of children: 3  . Years of education: N/A   Occupational History  . retired Retired   Social History Main Topics  . Smoking status: Former Smoker    Quit date: 12/28/2007  . Smokeless tobacco: Never Used     Comment: used to smoke 1.5 ppd  . Alcohol use No  . Drug use: No  . Sexual activity: Not on file   Other Topics Concern  . Not on file   Social History Narrative   Lives by himself    Lost wife to dementia 10-2015   Totally independent on ADL , no driving   Often came to office w/ his daughter July [phone]), his other daughter is  [name] , son is Corey                         Allergies as of 07/19/2017      Reactions   Baclofen Other (See Comments)   Unable to walk per Pt's family    Iodinated  Diagnostic Agents    Only one kidney    Ioxaglate    Only one kidney       Medication List       Accurate as of 07/19/17 11:59 PM. Always use your most recent med list.          Casanthranol-Docusate Sodium 30-100 MG Caps Take by mouth.   clobetasol 0.05 % external solution Commonly known as:  TEMOVATE APPLY AA BID   donepezil 5 MG tablet Commonly known as:  ARICEPT Take 5 mg by mouth at bedtime.   hydrALAZINE 25 MG tablet Commonly known as:  APRESOLINE Take 25 mg by mouth 3 (three) times daily.   hydrochlorothiazide 25 MG tablet Commonly known as:  HYDRODIURIL Take 12.5 mg by mouth daily.   labetalol 100 MG tablet Commonly known as:  NORMODYNE Take 1 tablet (100 mg total) by mouth 2 (two) times daily.   pregabalin 50 MG capsule Commonly known as:  LYRICA Take 50 mg by mouth at bedtime.   SE-TAN PLUS  162-115.2-1 MG Caps Take 1 tablet by mouth daily. Take 1 Tab  daily   sodium polystyrene 15 GM/60ML suspension Commonly known as:  KAYEXALATE Take 15 g by mouth on M, F.   Vitamin D 2000 units Caps Take 2,000 Units by mouth daily.          Objective:   Physical Exam BP (!) 162/61 (BP Location: Left Arm, Patient Position: Sitting, Cuff Size: Small)   Pulse 63   Temp 98.1 F (36.7 C) (Oral)   Ht 5\' 9"  (1.753 m)   Wt 155 lb 8 oz (70.5 kg)   SpO2 98%   BMI 22.96 kg/m  General:   Well developed, elderly gentleman in no distress HEENT:  Normocephalic . Face symmetric, atraumatic Lungs:  Decreased breath sounds bilaterally Normal respiratory effort, no intercostal retractions, no accessory muscle use. Heart: RRR,  no murmur.  No pretibial edema bilaterally  Skin: Not pale. Not jaundice Neurologic:  alert & oriented X3.  Speech normal, gait   unassisted, significant scoliosis make his gait difficult. Psych--  Cognition and judgment appear intact.  Cooperative with normal attention span and concentration.  Behavior appropriate. No anxious or depressed appearing.      Assessment & Plan:     Assessment  Hypertension, COPD--former smoker,PFTs 8- 2013 mild obstruction otherwise normal.Asx  Renal: --H/o Renal Cancer  --Solitary left kidney --Chronic renal insufficiency --hematuria: Dr Hazle Nordmann, cysto 01-2017 Neuro:   -on aricept per neuro, mild memory imparment -neuropathy, saw neuro 06-2015, Dr Posey Pronto. As off 03-2016 is followed at [facility]: [name] gen neuro: Dr Tonye Royalty, then Hall Busing (first appointment pending as off 07-19-17, movement d/o specialist)   MSK: --DJD --Mild spinal stenosis, has seen  Dr Flavia Shipper Osteopenia--DEXA 07-2015, osteopenia, rx vit D Pancytopenia-- sees hem-onc, 2011 felt to be from doxycycline. Last OV 03-2017 AAA s/p EVAR 2009 Acne rosacea  Plan:  Bilateral pneumonia, TCM: Presented to the ER with fatigue, noted to have some cough and a recent  pneumonia May 2008, chest x-ray negative, CT show bilateral basal pneumonia. S/p ABX, feels better. Had  a negative barium swallow study today. No issues found.  Recommend to take Mucinex as needed if cough, will do a CT chest in 2 months to be sure of resolution of pneumonia. Renal failure: Had posthospital labs done @ renal, has an appointment with them this week. Neuropathy: General neurologist [name] referred pt  to a movement specialist Dr. Hall Busing at [facility] (OV  pending). I made him aware of a movement specialist in town, they will call if they like a referral to Dr Tat RTC 4 months

## 2017-07-19 NOTE — Patient Instructions (Addendum)
  Continue with the same medications  Come back in 4 months for a checkup  We'll need to do a CT of the chest in 2 months.

## 2017-07-19 NOTE — Progress Notes (Signed)
Pre visit review using our clinic review tool, if applicable. No additional management support is needed unless otherwise documented below in the visit note. 

## 2017-07-19 NOTE — Telephone Encounter (Signed)
LEft detailed message for patients daughter.

## 2017-07-19 NOTE — Therapy (Signed)
[facility] [address]  [place], [place], [address] Phone: [phone]   Fax:  [phone]  Physical Therapy Treatment  Patient Details  Name: [name] MRN: [id number] Date of Birth: 08-14-1927 Referring Provider: Jessy Oto, MD  Encounter Date: 07/19/2017      PT End of Session - 07/19/17 1540    Visit Number 21   Number of Visits 22   Date for PT Re-Evaluation 08/05/17   Authorization Type Medicare / BCBS   PT Start Time 1540  pt delayed from MD appt   PT Stop Time 1622   PT Time Calculation (min) 42 min   Activity Tolerance Patient tolerated treatment well   Behavior During Therapy [facility] for tasks assessed/performed      Past Medical History:  Diagnosis Date  . Abdominal aortic aneurysm ([place]) 09/2008   s/o endovascular repair and angiogram 10/04/08   . Anemia    EGD Cscope 08-2012  . Bleeding ulcer 1997  . CKD (chronic kidney disease), stage IV (HCC)    Stable, Dr. Dimas Aguas  . COPD (chronic obstructive pulmonary disease) ([place])   . H/O hyperkalemia    Stable  . Hypertension   . Leukopenia 2/11   thought to be from doxy  . Renal cell carcinoma    clear cell type dx 10/09, s/p excision 10-24-08  . Renal insufficiency   . Solitary left kidney    W/ chronic tubule-interstitial damage, Dr. Dimas Aguas  . Spinal stenosis    causing LE weakness-persistant, being worked up by Hydrographic surveyor and neurologist    Past Surgical History:  Procedure Laterality Date  . ABDOMINAL AORTIC ANEURYSM REPAIR  2009   EVAR  . [place]  . [place]), 2004  . NEPHRECTOMY  2009   Open nephrectomy for cancer    There were no vitals filed for this visit.      Subjective Assessment - 07/19/17 1543    Subjective Pt's dtr reports pt has had a very busy day with his MBSS this morning, CT of abdomen and f/u with MD from recent hospitalization this pm. Pt reports feeling better  today but still notes feeling of heaviness/tiredness in LEs.   Patient Stated Goals "Walk good"   Currently in Pain? No/denies   Pain Score 0-No pain                         [facility] Adult PT Treatment/Exercise - 07/19/17 1540      Knee/Hip Exercises: Aerobic   Nustep lvl 6 x 6' (LE only)             Balance Exercises - 07/19/17 1540      OTAGO PROGRAM   Head Movements Standing;5 reps   Neck Movements Standing;5 reps   Back Extension Standing;5 reps  back to counter   Trunk Movements Standing;5 reps   Ankle Movements Standing;Sitting;10 reps  each   Knee Extensor 10 reps;Weight (comment)  3#   Knee Flexor 10 reps;Weight (comment)  3#   Hip ABductor 10 reps;Weight (comment)  3#   Ankle Plantorflexors 20 reps, support   Ankle Dorsiflexors 20 reps, support   Knee Bends 20 reps, no support   Backwards Walking Support  intermittent UE support - cues for increased stride length   Walking and Turning Around No assistive device   Sideways Walking No assistive device   Tandem  Stance 10 seconds, no support   Tandem Walk Support   One Leg Stand 10 seconds, no support  hand hovering over counter   Heel Toe Walking Backward --  Level C = Support             PT Short Term Goals - 05/24/17 0854      PT SHORT TERM GOAL #1   Title Assess dynamic gait stability per DGI or FGA by 04/22/17   Status Achieved     PT SHORT TERM GOAL #2   Title Independent with OTAGO Fall Prevention Program HEP at supported level by 05/13/17   Status Achieved           PT Long Term Goals - 07/19/17 1622      PT LONG TERM GOAL #1   Title Pt will be independent with OTAGO fall prevention HEP at unsupported level or other advanced balance HEP by 08/05/17   Status Partially Met     PT LONG TERM GOAL #2   Title B LE strength to >/= 4/5 for improved function by 08/05/17   Status Partially Met     PT LONG TERM GOAL #3   Title Pt will demonstrate improvement on the Berg  Balance Scale to 48/56 or greater to reduce risk for falls by 08/05/17   Status On-going  Berg = 43/56     PT LONG TERM GOAL #4   Title Pt will improve gait speed to 2.6 ft/sec or greater for improved safety with community ambulation by 08/05/17   Status On-going               Plan - 07/19/17 1546    Clinical Impression Statement Pt appearing more energetic and like himself today than at last visit, despite very full day with MBSS, abdominal CT and MD appt. [place] program reviewed with pt requiring decreasing need for UE support with many activities, but still requiring frequent rest breaks due to fatigue.    Rehab Potential Good   Clinical Impairments Affecting Rehab Potential Advanced age, Parkinson's disease, OA of mutiple joints, lumbar stenosis, LE neuropathy, HTN, COPD, CKD   PT Treatment/Interventions Patient/family education;Neuromuscular re-education;Balance training;Therapeutic exercise;Therapeutic activities;Functional mobility training;Gait training;Manual techniques;Taping;Dry needling;Electrical Stimulation;Moist Heat;Cryotherapy;ADLs/Self Care Home Management   PT Next Visit Plan recert vs discharge   Consulted and Agree with Plan of Care Patient      Patient will benefit from skilled therapeutic intervention in order to improve the following deficits and impairments:  Abnormal gait, Difficulty walking, Decreased activity tolerance, Decreased balance, Decreased coordination, Decreased safety awareness, Decreased strength, Impaired flexibility, Decreased range of motion, Postural dysfunction  Visit Diagnosis: Other abnormalities of gait and mobility  Difficulty in walking, not elsewhere classified  Unsteadiness on feet  Abnormal posture     Problem List Patient Active Problem List   Diagnosis Date Noted  . Parkinsonism ([place]) 07/10/2017  . Status post dilatation of esophageal stricture 05/15/2017  . Pancytopenia ([place]) 03/31/2016  . Follow-up --- PCP NOTES  09/02/2015  . Neuropathy 06/10/2015  . Pedal edema 01/30/2015  . Hip pain, chronic 01/30/2015  . Back pain 01/30/2015  . Cramps of lower extremity 01/30/2015  . Aftercare following surgery of the circulatory system, [place] 08/05/2014  . Annual physical exam 10/19/2011  . DJD (degenerative joint disease) 12/14/2010  . Anemia 06/06/2009  . COPD (chronic obstructive pulmonary disease) ([place]) 06/06/2009  . RENAL CELL CANCER 12/25/2008  . HTN (hypertension) 12/25/2008  . AAA (abdominal aortic aneurysm) without rupture ([place]) 12/25/2008  .  CKD (chronic kidney disease) 12/25/2008  . HYPERCHOLESTEROLEMIA 03/01/2008  . GERD 03/01/2008  . ACNE ROSACEA 11/29/2007  . DIVERTICULOSIS, COLON 04/12/2007    Percival Spanish, PT, MPT 07/19/2017, 6:33 PM  [facility] [address]  [place], [place], [address] Phone: [phone]   Fax:  [phone]  Name: FREDDY KINNE MRN: [id number] Date of Birth: [birth year]

## 2017-07-20 NOTE — Assessment & Plan Note (Signed)
Bilateral pneumonia, TCM: Presented to the ER with fatigue, noted to have some cough and a recent pneumonia May 2008, chest x-ray negative, CT show bilateral basal pneumonia. S/p ABX, feels better. Had  a negative barium swallow study today. No issues found.  Recommend to take Mucinex as needed if cough, will do a CT chest in 2 months to be sure of resolution of pneumonia. Renal failure: Had posthospital labs done @ renal, has an appointment with them this week. Neuropathy: General neurologist [name] referred pt  to a movement specialist Dr. Hall Busing at [facility] ([place] pending). I made him aware of a movement specialist in town, they will call if they like a referral to Dr Tat RTC 4 months

## 2017-07-21 DIAGNOSIS — N183 Chronic kidney disease, stage 3 (moderate): Secondary | ICD-10-CM | POA: Diagnosis not present

## 2017-07-21 DIAGNOSIS — I10 Essential (primary) hypertension: Secondary | ICD-10-CM | POA: Diagnosis not present

## 2017-07-21 DIAGNOSIS — N390 Urinary tract infection, site not specified: Secondary | ICD-10-CM | POA: Diagnosis not present

## 2017-07-26 ENCOUNTER — Ambulatory Visit: Payer: Medicare Other | Admitting: Physical Therapy

## 2017-07-26 DIAGNOSIS — R2681 Unsteadiness on feet: Secondary | ICD-10-CM

## 2017-07-26 DIAGNOSIS — R293 Abnormal posture: Secondary | ICD-10-CM

## 2017-07-26 DIAGNOSIS — R2689 Other abnormalities of gait and mobility: Secondary | ICD-10-CM | POA: Diagnosis not present

## 2017-07-26 DIAGNOSIS — R262 Difficulty in walking, not elsewhere classified: Secondary | ICD-10-CM | POA: Diagnosis not present

## 2017-07-26 NOTE — Therapy (Addendum)
[facility] [address]  [place], [place], [address] Phone: [phone]   Fax:  [phone]  Physical Therapy Treatment  Patient Details  Name: [name] MRN: [id number] Date of Birth: 1927-12-19 Referring Provider: Jessy Oto, MD  Encounter Date: 07/26/2017      PT End of Session - 07/26/17 1537    Visit Number 22   Number of Visits 22   Date for PT Re-Evaluation 08/05/17   Authorization Type Medicare / BCBS   PT Start Time 2563   PT Stop Time 1625   PT Time Calculation (min) 55 min   Activity Tolerance Patient tolerated treatment well   Behavior During Therapy [facility] for tasks assessed/performed      Past Medical History:  Diagnosis Date  . Abdominal aortic aneurysm ([place]) 09/2008   s/o endovascular repair and angiogram 10/04/08   . Anemia    EGD Cscope 08-2012  . Bleeding ulcer 1997  . CKD (chronic kidney disease), stage IV (HCC)    Stable, Dr. Dimas Aguas  . COPD (chronic obstructive pulmonary disease) ([place])   . H/O hyperkalemia    Stable  . Hypertension   . Leukopenia 2/11   thought to be from doxy  . Renal cell carcinoma    clear cell type dx 10/09, s/p excision 10-24-08  . Renal insufficiency   . Solitary left kidney    W/ chronic tubule-interstitial damage, Dr. Dimas Aguas  . Spinal stenosis    causing LE weakness-persistant, being worked up by Hydrographic surveyor and neurologist    Past Surgical History:  Procedure Laterality Date  . ABDOMINAL AORTIC ANEURYSM REPAIR  2009   EVAR  . [place]  . [place] ([place]), 2004  . NEPHRECTOMY  2009   Open nephrectomy for cancer    There were no vitals filed for this visit.      Subjective Assessment - 07/26/17 1537    Subjective Pt doing well - "just slow".   Patient Stated Goals "Walk good"   Currently in Pain? No/denies   Pain Score 0-No pain            [facility] PT Assessment - 07/26/17 1530       Assessment   Medical Diagnosis Unsteady gait   Referring Provider Jessy Oto, MD   Next MD Visit 09/30/17 with Dr. Louanne Skye; 11/02/17 with [name] (PCP)     Strength   Overall Strength Comments tested in sitting   Right Hip Flexion 4/5   Right Hip Extension 4/5   Right Hip External Rotation  4/5   Right Hip Internal Rotation 4/5   Right Hip ABduction 4+/5   Right Hip ADduction 4/5   Left Hip Flexion 4/5   Left Hip Extension 4/5   Left Hip External Rotation 4/5   Left Hip Internal Rotation 4/5   Left Hip ABduction 4+/5   Left Hip ADduction 4/5   Right Knee Flexion 4+/5   Right Knee Extension 4+/5   Left Knee Flexion 4+/5   Left Knee Extension 4+/5   Right Ankle Dorsiflexion 4+/5   Left Ankle Dorsiflexion 4+/5     Ambulation/Gait   Gait velocity 2.44 ft/sec     Standardized Balance Assessment   Standardized Balance Assessment Berg Balance Test;10 meter walk test;Dynamic Gait Index;Timed Up and Go Test   10 Meter Walk 13.47"     Berg Balance Test   Sit to Stand Able  to stand without using hands and stabilize independently   Standing Unsupported Able to stand safely 2 minutes   Sitting with Back Unsupported but Feet Supported on Floor or Stool Able to sit safely and securely 2 minutes   Stand to Sit Sits safely with minimal use of hands   Transfers Able to transfer safely, minor use of hands   Standing Unsupported with Eyes Closed Able to stand 10 seconds safely   Standing Ubsupported with Feet Together Able to place feet together independently and stand for 1 minute with supervision   From Standing, Reach Forward with Outstretched Arm Can reach forward >12 cm safely (5")   From Standing Position, Pick up Object from [place] to pick up shoe safely and easily   From Standing Position, Turn to Look Behind Over each Shoulder Looks behind one side only/other side shows less weight shift   Turn 360 Degrees Able to turn 360 degrees safely but slowly   Standing Unsupported,  Alternately Place Feet on Step/Stool Able to stand independently and safely and complete 8 steps in 20 seconds   Standing Unsupported, One Foot in Front Able to take small step independently and hold 30 seconds   Standing on One Leg Tries to lift leg/unable to hold 3 seconds but remains standing independently   Total Score 46     Dynamic Gait Index   Level Surface Mild Impairment   Change in Gait Speed Mild Impairment   Gait with Horizontal Head Turns Mild Impairment   Gait with Vertical Head Turns Mild Impairment   Gait and Pivot Turn Mild Impairment   Step Over Obstacle Mild Impairment   Step Around Obstacles Mild Impairment   Steps Moderate Impairment  reciprocal ascent, step-to descent   Total Score 15     Timed Up and Go Test   Normal TUG (seconds) 13.47   Manual TUG (seconds) 15.15                     [facility] Adult PT Treatment/Exercise - 07/26/17 1530      Knee/Hip Exercises: Aerobic   Nustep lvl 7 x 6'                PT Education - 07/26/17 1620    Education provided Yes   Education Details Review of recommended continued activity after PT completion - HEP, [employer], Silver Sneakers type group exercises   Person(s) Educated Patient;Child(ren)   Methods Explanation   Comprehension Verbalized understanding          PT Short Term Goals - 05/24/17 0854      PT SHORT TERM GOAL #1   Title Assess dynamic gait stability per DGI or FGA by 04/22/17   Status Achieved     PT SHORT TERM GOAL #2   Title Independent with OTAGO Fall Prevention Program HEP at supported level by 05/13/17   Status Achieved           PT Long Term Goals - 07/26/17 1537      PT LONG TERM GOAL #1   Title Pt will be independent with OTAGO fall prevention HEP at unsupported level or other advanced balance HEP by 08/05/17   Status Partially Met     PT LONG TERM GOAL #2   Title B LE strength to >/= 4/5 for improved function by 08/05/17   Status Achieved      PT LONG TERM GOAL #3   Title Pt will demonstrate improvement on  the Berg Balance Scale to 48/56 or greater to reduce risk for falls by 08/05/17   Status Not Met  Berg = 46/56     PT LONG TERM GOAL #4   Title Pt will improve gait speed to 2.6 ft/sec or greater for improved safety with community ambulation by 08/05/17   Status Not Met  2.44 ft/sec               Plan - 08/24/17 1538    Clinical Impression Statement Mr Luecke slowing rebounding from recent hospitalization for recurrent PNA. Improvments noted in B LE strength; Berg, TUG & DGI balance testing; as well as slight improvment in gait speed, however due to mutiple setbacks from 2 hospitalization during this epsiode, goals only partially met. Due to concerns about pt exhausting Medicare annual coverage for PT and likelihood of need to return to PT prior to the end of the year, pt and dtr opting to transition to HEP at this point and planning to look into community based [employer] type group exercise programs. As such, will place pt on hold for PT x 30 days in the event that issues arise necessitating return to PT, at which time pt will require a recert if PT to continue.   Rehab Potential Good   Clinical Impairments Affecting Rehab Potential Advanced age, Parkinson's disease, OA of mutiple joints, lumbar stenosis, LE neuropathy, HTN, COPD, CKD   PT Treatment/Interventions Patient/family education;Neuromuscular re-education;Balance training;Therapeutic exercise;Therapeutic activities;Functional mobility training;Gait training;Manual techniques;Taping;Dry needling;Electrical Stimulation;Moist Heat;Cryotherapy;ADLs/Self Care Home Management   PT Next Visit Plan 30 day hold   Consulted and Agree with Plan of Care Patient;Family member/caregiver   Family Member Consulted Dtr - [name]      Patient will benefit from skilled therapeutic intervention in order to improve the following deficits and impairments:  Abnormal gait,  Difficulty walking, Decreased activity tolerance, Decreased balance, Decreased coordination, Decreased safety awareness, Decreased strength, Impaired flexibility, Decreased range of motion, Postural dysfunction  Visit Diagnosis: Other abnormalities of gait and mobility  Difficulty in walking, not elsewhere classified  Unsteadiness on feet  Abnormal posture       G-Codes - August 24, 2017 1625    Functional Assessment Tool Used (Outpatient Only) Berg = 46/56 (17.9% limitation), FOTO = 49% (51% limitation) & clinical judgment   Functional Limitation Mobility: Walking and moving around   Mobility: Walking and Moving Around Goal Status [phone]) At least 20 percent but less than 40 percent impaired, limited or restricted   Mobility: Walking and Moving Around Discharge Status [phone]) At least 20 percent but less than 40 percent impaired, limited or restricted      Problem List Patient Active Problem List   Diagnosis Date Noted  . Parkinsonism ([place]) 07/10/2017  . Status post dilatation of esophageal stricture 05/15/2017  . Pancytopenia ([place]) 03/31/2016  . Follow-up --- PCP NOTES 09/02/2015  . Neuropathy 06/10/2015  . Pedal edema 01/30/2015  . Hip pain, chronic 01/30/2015  . Back pain 01/30/2015  . Cramps of lower extremity 01/30/2015  . Aftercare following surgery of the circulatory system, [place] 08/05/2014  . Annual physical exam 10/19/2011  . DJD (degenerative joint disease) 12/14/2010  . Anemia 06/06/2009  . COPD (chronic obstructive pulmonary disease) ([place]) 06/06/2009  . RENAL CELL CANCER 12/25/2008  . HTN (hypertension) 12/25/2008  . AAA (abdominal aortic aneurysm) without rupture ([place]) 12/25/2008  . CKD (chronic kidney disease) 12/25/2008  . HYPERCHOLESTEROLEMIA 03/01/2008  . GERD 03/01/2008  . ACNE ROSACEA 11/29/2007  . DIVERTICULOSIS, COLON 04/12/2007  Percival Spanish, PT, MPT 07/26/2017, 6:57 PM  [facility] [address]  [place], [place], [address] Phone: [phone]   Fax:  [phone]  Name: MIKIE MISNER MRN: [id number] Date of Birth: 1927/07/15  PHYSICAL THERAPY DISCHARGE SUMMARY  Visits from Start of Care: 22  Current functional level related to goals / functional outcomes:   Refer to above clinical impression. Pt has not needed to return in 30 days, therefore will proceed with discharge from PT for this episode.   Remaining deficits:   As above.   Education / Equipment:   HEP; [employer]; community resources for continued exercise/activity  Plan: Patient agrees to discharge.  Patient goals were partially met. Patient is being discharged due to not returning since the last visit.  ?????   Percival Spanish, PT, MPT 08/25/17, 1:36 PM  [facility] [address]  [place], [place], [address] Phone: [phone]   Fax:  [phone]

## 2017-08-24 ENCOUNTER — Encounter: Payer: Self-pay | Admitting: Surgery

## 2017-09-02 DIAGNOSIS — R5383 Other fatigue: Secondary | ICD-10-CM | POA: Diagnosis not present

## 2017-09-02 DIAGNOSIS — R195 Other fecal abnormalities: Secondary | ICD-10-CM | POA: Diagnosis not present

## 2017-09-02 DIAGNOSIS — E860 Dehydration: Secondary | ICD-10-CM | POA: Diagnosis not present

## 2017-09-02 DIAGNOSIS — I517 Cardiomegaly: Secondary | ICD-10-CM | POA: Diagnosis not present

## 2017-09-02 DIAGNOSIS — I700 Atherosclerosis of aorta: Secondary | ICD-10-CM | POA: Diagnosis not present

## 2017-09-02 DIAGNOSIS — R531 Weakness: Secondary | ICD-10-CM | POA: Diagnosis not present

## 2017-09-02 DIAGNOSIS — R609 Edema, unspecified: Secondary | ICD-10-CM | POA: Diagnosis not present

## 2017-09-02 DIAGNOSIS — Z87891 Personal history of nicotine dependence: Secondary | ICD-10-CM | POA: Diagnosis not present

## 2017-09-02 DIAGNOSIS — G319 Degenerative disease of nervous system, unspecified: Secondary | ICD-10-CM | POA: Diagnosis not present

## 2017-09-02 DIAGNOSIS — J9811 Atelectasis: Secondary | ICD-10-CM | POA: Diagnosis not present

## 2017-09-02 DIAGNOSIS — R2689 Other abnormalities of gait and mobility: Secondary | ICD-10-CM | POA: Diagnosis not present

## 2017-09-02 DIAGNOSIS — Z8673 Personal history of transient ischemic attack (TIA), and cerebral infarction without residual deficits: Secondary | ICD-10-CM | POA: Diagnosis not present

## 2017-09-02 DIAGNOSIS — R4182 Altered mental status, unspecified: Secondary | ICD-10-CM | POA: Diagnosis not present

## 2017-09-03 DIAGNOSIS — J9811 Atelectasis: Secondary | ICD-10-CM | POA: Diagnosis not present

## 2017-09-05 ENCOUNTER — Ambulatory Visit (HOSPITAL_COMMUNITY)
Admission: RE | Admit: 2017-09-05 | Discharge: 2017-09-05 | Disposition: A | Discharge status: Home / Self Care | Payer: Medicare Other | Source: Ambulatory Visit | Attending: Surgery | Admitting: Surgery

## 2017-09-05 ENCOUNTER — Ambulatory Visit (INDEPENDENT_AMBULATORY_CARE_PROVIDER_SITE_OTHER): Payer: Medicare Other | Admitting: Surgery

## 2017-09-05 ENCOUNTER — Other Ambulatory Visit: Payer: Self-pay | Admitting: Surgery

## 2017-09-05 DIAGNOSIS — I714 Abdominal aortic aneurysm, without rupture, unspecified: Secondary | ICD-10-CM

## 2017-09-05 NOTE — Progress Notes (Signed)
[facility] [place]  Patient name: [name] MRN: [id number] DOB: 06/28/1927 Sex: male   REASON FOR VISIT:    Follow up  HISOTRY OF PRESENT ILLNESS:   [name] is a 83 y.o. male who presents for continued follow-up s/p endovascular repair of his AAA on 10/04/2008 by Dr. Trula Slade. Prior to his procedure, he underwent right nephrectomy due to a renal cell tumor. He CKD with a creatinine of 2.2 back in October 2017. His aneurysm sac back on 10/14/2008 measured 5.8 cm. He has been followed with non contrast CT scans due to kidney function and difficulty obtaining good ultrasound images.   He has had 2 epidodes of pneumonia since I last saw him.  Otherwise, there have been no significant changes   PAST MEDICAL HISTORY:   Past Medical History:  Diagnosis Date  . Abdominal aortic aneurysm ([place]) 09/2008   s/o endovascular repair and angiogram 10/04/08   . Anemia    EGD Cscope 08-2012  . Bleeding ulcer 1997  . CKD (chronic kidney disease), stage IV (HCC)    Stable, Dr. Dimas Aguas  . COPD (chronic obstructive pulmonary disease) ([place])   . H/O hyperkalemia    Stable  . Hypertension   . Leukopenia 2/11   thought to be from doxy  . Renal cell carcinoma    clear cell type dx 10/09, s/p excision 10-24-08  . Renal insufficiency   . Solitary left kidney    W/ chronic tubule-interstitial damage, Dr. Dimas Aguas  . Spinal stenosis    causing LE weakness-persistant, being worked up by Hydrographic surveyor and neurologist     FAMILY HISTORY:   Family History  Problem Relation Age of Onset  . Diabetes Father        ?  . Stroke Father   . Hypertension Father   . Colon cancer Neg Hx   . Prostate cancer Neg Hx     SOCIAL HISTORY:   Social History  Substance Use Topics  . Smoking status: Former Smoker    Quit date: 12/28/2007  . Smokeless tobacco: Never Used     Comment: used to smoke 1.5 ppd  . Alcohol use No      ALLERGIES:   Allergies  Allergen Reactions  . Baclofen Other (See Comments)    Unable to walk per Pt's family   . Iodinated Diagnostic Agents     Only one kidney   . Ioxaglate     Only one kidney      CURRENT MEDICATIONS:   Current Outpatient Prescriptions  Medication Sig Dispense Refill  . Casanthranol-Docusate Sodium 30-100 MG CAPS Take by mouth.      . Cholecalciferol (VITAMIN D) 2000 units CAPS Take 2,000 Units by mouth daily.    . clobetasol (TEMOVATE) 0.05 % external solution APPLY AA BID  3  . donepezil (ARICEPT) 5 MG tablet Take 5 mg by mouth at bedtime.    . FeFum-FePo-FA-B Cmp-C-Zn-Mn-Cu (SE-TAN PLUS) 162-115.2-1 MG CAPS Take 1 tablet by mouth daily. Take 1 Tab  daily    . hydrALAZINE (APRESOLINE) 25 MG tablet Take 25 mg by mouth 3 (three) times daily.    . hydrochlorothiazide (HYDRODIURIL) 25 MG tablet Take 12.5 mg by mouth daily.     Marland Kitchen labetalol (NORMODYNE) 100 MG tablet Take 1 tablet (100 mg total) by mouth 2 (two) times daily. 60 tablet 6  . pregabalin (LYRICA) 50 MG capsule Take 50 mg by mouth at bedtime.     Marland Kitchen  sodium polystyrene (KAYEXALATE) 15 GM/60ML suspension Take 15 g by mouth on M, F.     No current facility-administered medications for this visit.     REVIEW OF SYSTEMS:   [X]  denotes positive finding, [ ]  denotes negative finding Cardiac  Comments:  Chest pain or chest pressure:    Shortness of breath upon exertion:    Short of breath when lying flat:    Irregular heart rhythm:        Vascular    Pain in calf, thigh, or hip brought on by ambulation:    Pain in feet at night that wakes you up from your sleep:     Blood clot in your veins:    Leg swelling:         Pulmonary    Oxygen at home:    Productive cough:     Wheezing:         Neurologic    Sudden weakness in arms or legs:     Sudden numbness in arms or legs:     Sudden onset of difficulty speaking or slurred speech:    Temporary loss of vision in one eye:     Problems with  dizziness:         Gastrointestinal    Blood in stool:     Vomited blood:         Genitourinary    Burning when urinating:     Blood in urine:        Psychiatric    Major depression:         Hematologic    Bleeding problems:    Problems with blood clotting too easily:        Skin    Rashes or ulcers:        Constitutional    Fever or chills:      PHYSICAL EXAM:   There were no vitals filed for this visit.  GENERAL: The patient is a well-nourished male, in no acute distress. The vital signs are documented above. CARDIAC: There is a regular rate and rhythm.  PULMONARY: Non-labored respirations MUSCULOSKELETAL: There are no major deformities or cyanosis. NEUROLOGIC: No focal weakness or paresthesias are detected. SKIN: There are no ulcers or rashes noted. PSYCHIATRIC: The patient has a normal affect.  STUDIES:   Original size:  5.8 11/2010:  6.1 01/2015:   6.1 08/2016:  6.3 02/2017:  6.7 06/2017:  7.0  I had the aneurysm ultrasounded today.  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  We have been reluctant to use contrast to get better evaluation of the etiology of the increase in size given his renal function.  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  I'm going to send the patient to interventional radiology to get there input regarding the possibility of treating the endoleak.    Annamarie Major, MD [facility] [place] [facility] [phone] Pager [phone]

## 2017-09-06 ENCOUNTER — Other Ambulatory Visit: Payer: Self-pay | Admitting: Surgery

## 2017-09-06 DIAGNOSIS — IMO0002 Reserved for concepts with insufficient information to code with codable children: Secondary | ICD-10-CM

## 2017-09-06 DIAGNOSIS — T82330A Leakage of aortic (bifurcation) graft (replacement), initial encounter: Secondary | ICD-10-CM

## 2017-09-07 ENCOUNTER — Ambulatory Visit
Admission: RE | Admit: 2017-09-07 | Discharge: 2017-09-07 | Disposition: A | Discharge status: Home / Self Care | Payer: Medicare Other | Source: Ambulatory Visit | Attending: Surgery | Admitting: Surgery

## 2017-09-07 DIAGNOSIS — T82330A Leakage of aortic (bifurcation) graft (replacement), initial encounter: Secondary | ICD-10-CM

## 2017-09-07 DIAGNOSIS — T82898A Other specified complication of vascular prosthetic devices, implants and grafts, initial encounter: Secondary | ICD-10-CM | POA: Diagnosis not present

## 2017-09-07 DIAGNOSIS — IMO0002 Reserved for concepts with insufficient information to code with codable children: Secondary | ICD-10-CM

## 2017-09-07 HISTORY — PX: IR RADIOLOGIST EVAL & MGMT: IMG5224

## 2017-09-07 NOTE — Consult Note (Signed)
Chief Complaint: Patient was seen in consultation today for  Chief Complaint  Patient presents with  . Advice Only    Consult for Type 2 Endoleak     at the request of Brabham,Vance W  Referring Physician(s): Harold Barban W  History of Present Illness: [name] is a 83 y.o. male with a history of abdominal aortic aneurysm and right-sided renal cell carcinoma. He underwent endovascular repair of his infrarenal abdominal aortic aneurysm on 10/04/2008 by Dr. Trula Slade. Just a few weeks after this procedure, he underwent a right nephrectomy for definitive treatment of his right-sided renal cell carcinoma. He has since struggled with chronic kidney disease with a baseline creatinine of 2.2-2.3 with occasional elevations into the upper 2's during times of sickness or dehydration.  Due to his underlying chronic kidney disease, his aneurysm has been followed with noncontrast CT scans. His most recent prior imaging demonstrates a relatively significant interval growth of the excluded aneurysm sac over the last 2 years which is suspicious for an underlying endoleak. His vascular surgeon, Dr. Trula Slade, evaluated him in the office with duplex ultrasound and confirm the presence of a type II endoleak which is reported to be associated with the inferior mesenteric artery. He now presents at the kind request of Dr. Trula Slade for further evaluation.  [name] is accompanied by his daughter who is very familiar with his medical history. [name] is essentially clinically asymptomatic. He denies back pain, flank pain, hypotension or syncope. He has no active complaints at this time.  He has been admitted to the hospital twice over the last few months for pneumonia. Most recently he was in the emergency room at [facility] on September 7 and received intravenous fluids. His creatinine at that time was 2.43.    Past Medical History:  Diagnosis Date  . Abdominal aortic aneurysm ([place])  09/2008   s/o endovascular repair and angiogram 10/04/08   . Anemia    EGD Cscope 08-2012  . Bleeding ulcer 1997  . CKD (chronic kidney disease), stage IV (HCC)    Stable, Dr. Dimas Aguas  . COPD (chronic obstructive pulmonary disease) ([place])   . H/O hyperkalemia    Stable  . Hypertension   . Leukopenia 2/11   thought to be from doxy  . Renal cell carcinoma    clear cell type dx 10/09, s/p excision 10-24-08  . Renal insufficiency   . Solitary left kidney    W/ chronic tubule-interstitial damage, Dr. Dimas Aguas  . Spinal stenosis    causing LE weakness-persistant, being worked up by Hydrographic surveyor and neurologist    Past Surgical History:  Procedure Laterality Date  . ABDOMINAL AORTIC ANEURYSM REPAIR  2009   EVAR  . [place]  . [place] 2004  . NEPHRECTOMY  2009   Open nephrectomy for cancer    Allergies: Baclofen; Iodinated diagnostic agents; and Ioxaglate  Medications: Prior to Admission medications   Medication Sig Start Date End Date Taking? Authorizing Provider  Casanthranol-Docusate Sodium 30-100 MG CAPS Take by mouth.      [provider]  Cholecalciferol (VITAMIN D) 2000 units CAPS Take 2,000 Units by mouth daily.    [provider]  clobetasol (TEMOVATE) 0.05 % external solution APPLY AA BID 04/18/17   [provider]  donepezil (ARICEPT) 5 MG tablet Take 5 mg by mouth at bedtime.    [provider]  FeFum-FePo-FA-B Cmp-C-Zn-Mn-Cu (SE-TAN PLUS) 162-115.2-1 MG  CAPS Take 1 tablet by mouth daily. Take 1 Tab  daily    [provider]  hydrALAZINE (APRESOLINE) 25 MG tablet Take 25 mg by mouth 3 (three) times daily.    [provider]  hydrochlorothiazide (HYDRODIURIL) 25 MG tablet Take 12.5 mg by mouth daily.     [provider]  labetalol (NORMODYNE) 100 MG tablet Take 1 tablet (100 mg total) by mouth 2 (two) times daily. 07/14/15   Colon Branch, MD  pregabalin (LYRICA) 50 MG  capsule Take 50 mg by mouth at bedtime.     [provider]  sodium polystyrene (KAYEXALATE) 15 GM/60ML suspension Take 15 g by mouth on M, F.    [provider]     Family History  Problem Relation Age of Onset  . Diabetes Father        ?  . Stroke Father   . Hypertension Father   . Colon cancer Neg Hx   . Prostate cancer Neg Hx     Social History   Social History  . Marital status: Widowed    Spouse name: N/A  . Number of children: 3  . Years of education: N/A   Occupational History  . retired Retired   Social History Main Topics  . Smoking status: Former Smoker    Quit date: 12/28/2007  . Smokeless tobacco: Never Used     Comment: used to smoke 1.5 ppd  . Alcohol use No  . Drug use: No  . Sexual activity: Not on file   Other Topics Concern  . Not on file   Social History Narrative   Lives by himself    Lost wife to dementia 10-2015   Totally independent on ADL , no driving   Often came to office w/ his daughter July [phone]), his other daughter is  [name] , son is [place]                       Review of Systems: A 12 point ROS discussed and pertinent positives are indicated in the HPI above.  All other systems are negative.  Review of Systems  Vital Signs: BP (!) 183/75   Pulse (!) 53   Temp 97.8 F (36.6 C) (Oral)   Resp 14   Ht 5\' 9"  (1.753 m)   Wt 155 lb (70.3 kg)   SpO2 97%   BMI 22.89 kg/m   Physical Exam  Constitutional: He is oriented to person, place, and time. He appears well-developed and well-nourished. No distress.  HENT:  Head: Normocephalic and atraumatic.  Eyes: No scleral icterus.  Cardiovascular: Normal rate and regular rhythm.   Pulmonary/Chest: Effort normal.  Abdominal: Soft. He exhibits no distension. There is no tenderness.  Neurological: He is alert and oriented to person, place, and time.  Skin: Skin is warm and dry.  Psychiatric: He has a normal mood and affect. His behavior is normal.  Nursing  note and vitals reviewed.   Imaging: No results found.  Labs:  CBC:  Recent Labs  09/29/16 10/22/16 05/27/17 1530  WBC 2.9 2.7 2.3*  HGB 10.4* 11.0* 9.7*  HCT 33* 34* 29.9*  PLT 78* 97* 91*    COAGS: No results for input(s): INR, APTT in the last 8760 hours.  BMP:  Recent Labs  10/07/16 10/22/16 04/04/17 05/27/17 1530  NA 146 144 145 146  K 4.9 4.5 4.3 4.2  CL  --   --   --  109  CO2  --   --   --  28  GLUCOSE  --   --   --  124*  BUN 29* 27* 29* 31*  CALCIUM  --   --   --  8.4*  CREATININE 2.5* 2.2* 2.3* 2.82*    LIVER FUNCTION TESTS: No results for input(s): BILITOT, AST, ALT, ALKPHOS, PROT, ALBUMIN in the last 8760 hours.  TUMOR MARKERS: No results for input(s): AFPTM, CEA, CA199, CHROMGRNA in the last 8760 hours.  Assessment and Plan:  83 year old male with an enlarging aneurysm sac 9 years status post EVAR.  After reviewing his imaging with him and his daughter, we are quite convinced that his aneurysm sac demonstrates fairly significant growth over the past 2 years dating back to August 2016.  This indicates the presence of an underlying endoleak which was reportedly confirmed in the office by duplex ultrasound evaluation.  His underlying chronic kidney disease is a complicating feature. However, his baseline renal function is not terrible and with proper pre-and post procedural hydration I think we can minimize the risk of renal failure. I did discuss this with his nephrologist who agrees that while his risk is low, worsening of his renal function and renal failure cannot be excluded completely. However, the risk of aneurysm rupture is much higher than the risk of contrast-induced nephropathy.  I discussed the natural history of endoleak's, strategies for repair, the risks including unsuccessful repair, recurrent leak, contrast-induced renal failure, bleeding, infection and nontarget embolization, as well as the benefits and alternatives. We discussed the  strategy of continued observation (not ideal given that we have already demonstrated definitive growth), planning CT arteriogram with pre-and post-intravenous hydration, and proceeding directly to angiogram and endovascular repair based off of the duplex ultrasound findings suggesting involvement of the inferior mesenteric artery.  1.) Schedule for endoleak repair at [facility].  Patient will need to be admitted by the hospitalist service the day prior to the procedure to receive IV hydration at 29mL/kg/hr for 12 hrs and mucomist.  Pt will then have the procedure the following day and remain admitted for post procedure hydration.  Expected stay of 2 nights.  Please let Dr. Dimas Aguas know the day of the procedure and pre-procedure admission as he wants to follow Mr. [facility] renal function closely.    Thank you for this interesting consult.  I greatly enjoyed meeting SARGENT MANKEY and look forward to participating in their care.  A copy of this report was sent to the requesting provider on this date.  Electronically Signed: Jacqulynn Cadet 09/07/2017, 9:31 AM   I spent a total of  60 Minutes  in face to face in clinical consultation, greater than 50% of which was counseling/coordinating care for type II endoleak.

## 2017-09-09 NOTE — Addendum Note (Signed)
Addended by: Lianne Cure A on: 09/09/2017 04:53 PM   Modules accepted: Orders

## 2017-09-13 DIAGNOSIS — D61818 Other pancytopenia: Secondary | ICD-10-CM | POA: Diagnosis not present

## 2017-09-13 DIAGNOSIS — N189 Chronic kidney disease, unspecified: Secondary | ICD-10-CM | POA: Diagnosis not present

## 2017-09-13 DIAGNOSIS — N281 Cyst of kidney, acquired: Secondary | ICD-10-CM | POA: Diagnosis not present

## 2017-09-13 DIAGNOSIS — D631 Anemia in chronic kidney disease: Secondary | ICD-10-CM | POA: Diagnosis not present

## 2017-09-13 DIAGNOSIS — D696 Thrombocytopenia, unspecified: Secondary | ICD-10-CM | POA: Diagnosis not present

## 2017-09-13 DIAGNOSIS — D72819 Decreased white blood cell count, unspecified: Secondary | ICD-10-CM | POA: Diagnosis not present

## 2017-09-15 DIAGNOSIS — N390 Urinary tract infection, site not specified: Secondary | ICD-10-CM | POA: Diagnosis not present

## 2017-09-15 DIAGNOSIS — N184 Chronic kidney disease, stage 4 (severe): Secondary | ICD-10-CM | POA: Diagnosis not present

## 2017-09-15 DIAGNOSIS — I10 Essential (primary) hypertension: Secondary | ICD-10-CM | POA: Diagnosis not present

## 2017-09-18 ENCOUNTER — Telehealth: Payer: Self-pay | Admitting: Internal Medicine

## 2017-09-18 NOTE — Telephone Encounter (Signed)
Okay, noted. Thank you.

## 2017-09-18 NOTE — Telephone Encounter (Signed)
-----   Message from Katha Hamming sent at 09/16/2017 12:27 PM EDT ----- Regarding: ct chest Patient's daughter called back and said Dr. Dimas Aguas, recommended [name] did not have to get the CT done due to his kidney levels and no current symptoms of pneumonia.    Thanks, Hoyle Sauer

## 2017-09-22 DIAGNOSIS — I10 Essential (primary) hypertension: Secondary | ICD-10-CM | POA: Diagnosis not present

## 2017-09-22 DIAGNOSIS — K5900 Constipation, unspecified: Secondary | ICD-10-CM | POA: Diagnosis not present

## 2017-09-22 DIAGNOSIS — Z87891 Personal history of nicotine dependence: Secondary | ICD-10-CM | POA: Diagnosis not present

## 2017-09-22 DIAGNOSIS — G20 Parkinson's disease: Secondary | ICD-10-CM | POA: Diagnosis not present

## 2017-09-22 DIAGNOSIS — N183 Chronic kidney disease, stage 3 (moderate): Secondary | ICD-10-CM | POA: Diagnosis not present

## 2017-09-22 LAB — BASIC METABOLIC PANEL
BUN: 32 — AB (ref 4–21)
CREATININE: 2.3 — AB (ref 0.6–1.3)
Glucose: 85
Potassium: 4.5 (ref 3.4–5.3)
Sodium: 146 (ref 137–147)

## 2017-09-26 DIAGNOSIS — D631 Anemia in chronic kidney disease: Secondary | ICD-10-CM | POA: Diagnosis not present

## 2017-09-26 DIAGNOSIS — N183 Chronic kidney disease, stage 3 (moderate): Secondary | ICD-10-CM | POA: Diagnosis not present

## 2017-09-26 DIAGNOSIS — N390 Urinary tract infection, site not specified: Secondary | ICD-10-CM | POA: Diagnosis not present

## 2017-09-26 DIAGNOSIS — I10 Essential (primary) hypertension: Secondary | ICD-10-CM | POA: Diagnosis not present

## 2017-09-30 ENCOUNTER — Ambulatory Visit (INDEPENDENT_AMBULATORY_CARE_PROVIDER_SITE_OTHER): Payer: Medicare Other | Admitting: Specialist

## 2017-10-03 ENCOUNTER — Encounter: Payer: Self-pay | Admitting: Internal Medicine

## 2017-10-04 ENCOUNTER — Other Ambulatory Visit (HOSPITAL_COMMUNITY): Payer: Self-pay | Admitting: Interventional Radiology

## 2017-10-05 ENCOUNTER — Encounter (INDEPENDENT_AMBULATORY_CARE_PROVIDER_SITE_OTHER): Payer: Self-pay | Admitting: Specialist

## 2017-10-05 ENCOUNTER — Ambulatory Visit (INDEPENDENT_AMBULATORY_CARE_PROVIDER_SITE_OTHER): Payer: Medicare Other | Admitting: Specialist

## 2017-10-05 ENCOUNTER — Other Ambulatory Visit (HOSPITAL_COMMUNITY): Payer: Self-pay | Admitting: Interventional Radiology

## 2017-10-05 VITALS — BP 152/63 | HR 56 | Ht 69.0 in | Wt 155.0 lb

## 2017-10-05 DIAGNOSIS — M5441 Lumbago with sciatica, right side: Secondary | ICD-10-CM | POA: Diagnosis not present

## 2017-10-05 DIAGNOSIS — M48062 Spinal stenosis, lumbar region with neurogenic claudication: Secondary | ICD-10-CM

## 2017-10-05 NOTE — Patient Instructions (Signed)
Avoid frequent bending and stooping  No lifting greater than 10 lbs. May use ice or moist heat for pain. Weight loss is of benefit. Handicap license is approved. Call his nephrologist to enquire about leg swelling and whether fluid restrictions are warranted.  PT at [facility] for endurance, LE strengthening and balance and coordination.

## 2017-10-05 NOTE — Progress Notes (Signed)
Office Visit Note   Patient: [name]           Date of Birth: 1927-02-14           MRN: [id number] Visit Date: 10/05/2017              Requested by: Colon Branch, [address] [place], [place] [id number] PCP: Colon Branch, MD   Assessment & Plan: Visit Diagnoses: No diagnosis found.  Plan: Avoid frequent bending and stooping  No lifting greater than 10 lbs. May use ice or moist heat for pain. Weight loss is of benefit. Handicap license is approved. Call his nephrologist to enquire about leg swelling and whether fluid restrictions are warranted.  PT at [facility] for endurance, LE strengthening and balance and coordination.  Follow-Up Instructions: No Follow-up on file.   Orders:  No orders of the defined types were placed in this encounter.  No orders of the defined types were placed in this encounter.     Procedures: No procedures performed   Clinical Data: No additional findings.   Subjective: Chief Complaint  Patient presents with  . Right Leg - Follow-up  . Left Leg - Follow-up    83 year old male with history of lumbar spinal stenosis. He has done well with PT and OT and has recently seen neurology at [facility] and the gait disorder lab. He has been started on  Low dose Sinemet and is on aricept. He lives at home, with daughter and son in law over 4 times per day. He is wearing compression stocking now athletic. Now has a nephrologist, Dr. Venetia Constable, He is stage 4 renal insufficiency. The radiology interventionalist, Dr. Jenetta Loges and Dr.Brabraham.    Review of Systems  Constitutional: Negative.   HENT: Negative.   Eyes: Negative.   Respiratory: Positive for chest tightness, shortness of breath and wheezing.   Cardiovascular: Negative.   Gastrointestinal: Negative.   Endocrine: Negative.   Genitourinary: Negative.   Musculoskeletal: Positive for arthralgias, back pain and myalgias. Negative for gait problem and joint  swelling.  Skin: Negative.   Allergic/Immunologic: Negative.   Neurological: Negative.   Hematological: Negative.   Psychiatric/Behavioral: Negative.      Objective: Vital Signs: BP (!) 152/63 (BP Location: Left Arm, Patient Position: Sitting)   Pulse (!) 56   Ht 5\' 9"  (1.753 m)   Wt 155 lb (70.3 kg)   BMI 22.89 kg/m   Physical Exam  Constitutional: He is oriented to person, place, and time. He appears well-developed and well-nourished. No distress.  HENT:  Head: Normocephalic and atraumatic.  Eyes: Pupils are equal, round, and reactive to light. EOM are normal. Right eye exhibits no discharge. Left eye exhibits no discharge.  Neck: Normal range of motion. Neck supple. No JVD present. No tracheal deviation present. No thyromegaly present.  Cardiovascular: Exam reveals no friction rub.   No murmur heard. Pulmonary/Chest: Effort normal and breath sounds normal.  Abdominal: Soft. Bowel sounds are normal. He exhibits no distension and no mass. There is no tenderness. There is no guarding.  Musculoskeletal: He exhibits edema and tenderness.  Lymphadenopathy:    He has no cervical adenopathy.  Neurological: He is alert and oriented to person, place, and time.  Skin: Skin is warm and dry. No rash noted. He is not diaphoretic. No erythema. No pallor.  Psychiatric: He has a normal mood and affect. His behavior is normal. Judgment and thought content normal.  Back Exam   Tenderness  The patient is experiencing tenderness in the lumbar.  Range of Motion  Extension: abnormal  Flexion: abnormal  Lateral Bend Right: abnormal  Lateral Bend Left: abnormal  Rotation Right: abnormal  Rotation Left: abnormal   Muscle Strength  Right Hamstrings:  5/5  Left Hamstrings:  5/5   Tests  Straight leg raise right: negative Straight leg raise left: negative  Reflexes  Patellar: abnormal Achilles: abnormal Babinski's sign: normal   Other  Toe Walk: abnormal Heel Walk:  abnormal Sensation: normal Erythema: no back redness Scars: absent      Specialty Comments:  No specialty comments available.  Imaging: No results found.   PMFS History: Patient Active Problem List   Diagnosis Date Noted  . Parkinsonism ([place]) 07/10/2017  . Status post dilatation of esophageal stricture 05/15/2017  . Pancytopenia ([place]) 03/31/2016  . Follow-up --- PCP NOTES 09/02/2015  . Neuropathy 06/10/2015  . Pedal edema 01/30/2015  . Hip pain, chronic 01/30/2015  . Back pain 01/30/2015  . Cramps of lower extremity 01/30/2015  . Aftercare following surgery of the circulatory system, [place] 08/05/2014  . Annual physical exam 10/19/2011  . DJD (degenerative joint disease) 12/14/2010  . Anemia 06/06/2009  . COPD (chronic obstructive pulmonary disease) ([place]) 06/06/2009  . RENAL CELL CANCER 12/25/2008  . HTN (hypertension) 12/25/2008  . AAA (abdominal aortic aneurysm) without rupture ([place]) 12/25/2008  . CKD (chronic kidney disease) 12/25/2008  . HYPERCHOLESTEROLEMIA 03/01/2008  . GERD 03/01/2008  . ACNE ROSACEA 11/29/2007  . DIVERTICULOSIS, COLON 04/12/2007   Past Medical History:  Diagnosis Date  . Abdominal aortic aneurysm ([place]) 09/2008   s/o endovascular repair and angiogram 10/04/08   . Anemia    EGD Cscope 08-2012  . Bleeding ulcer 1997  . CKD (chronic kidney disease), stage IV (HCC)    Stable, Dr. Dimas Aguas  . COPD (chronic obstructive pulmonary disease) ([place])   . H/O hyperkalemia    Stable  . Hypertension   . Leukopenia 2/11   thought to be from doxy  . Renal cell carcinoma    clear cell type dx 10/09, s/p excision 10-24-08  . Renal insufficiency   . Solitary left kidney    W/ chronic tubule-interstitial damage, Dr. Dimas Aguas  . Spinal stenosis    causing LE weakness-persistant, being worked up by Hydrographic surveyor and neurologist    Family History  Problem Relation Age of Onset  . Diabetes Father        ?  . Stroke Father   . Hypertension Father    . Colon cancer Neg Hx   . Prostate cancer Neg Hx     Past Surgical History:  Procedure Laterality Date  . ABDOMINAL AORTIC ANEURYSM REPAIR  2009   EVAR  . [place]  . [place]), 2004  . NEPHRECTOMY  2009   Open nephrectomy for cancer   Social History   Occupational History  . retired Retired   Social History Main Topics  . Smoking status: Former Smoker    Quit date: 12/28/2007  . Smokeless tobacco: Never Used     Comment: used to smoke 1.5 ppd  . Alcohol use No  . Drug use: No  . Sexual activity: Not on file

## 2017-10-06 ENCOUNTER — Other Ambulatory Visit (HOSPITAL_COMMUNITY): Payer: Self-pay | Admitting: Interventional Radiology

## 2017-10-06 DIAGNOSIS — IMO0002 Reserved for concepts with insufficient information to code with codable children: Secondary | ICD-10-CM

## 2017-10-06 DIAGNOSIS — T82330A Leakage of aortic (bifurcation) graft (replacement), initial encounter: Secondary | ICD-10-CM

## 2017-10-07 ENCOUNTER — Telehealth (HOSPITAL_COMMUNITY): Payer: Self-pay | Admitting: Radiology

## 2017-10-07 NOTE — Telephone Encounter (Signed)
We have made 3 calls to the pt's daughter and left VM's for her to call us back. She has not yet called as of 10/07/17. We had planned to treat Mr. Catalfamo on 10/13/17, however if they do not call back by Monday, 10/10/17 we most likely will have to reschedule him. JM

## 2017-10-10 ENCOUNTER — Encounter: Payer: Self-pay | Admitting: Interventional Radiology

## 2017-10-12 ENCOUNTER — Telehealth (HOSPITAL_COMMUNITY): Payer: Self-pay | Admitting: Radiology

## 2017-10-12 NOTE — Telephone Encounter (Signed)
Called pt's daughter, left VM for her to call to pick a date for her father's procedure with Dr. Laurence Ferrari. JM

## 2017-10-13 ENCOUNTER — Inpatient Hospital Stay (HOSPITAL_COMMUNITY)
Admission: RE | Admit: 2017-10-13 | Payer: Medicare Other | Source: Ambulatory Visit | Admitting: Interventional Radiology

## 2017-10-13 ENCOUNTER — Ambulatory Visit (HOSPITAL_COMMUNITY): Admission: RE | Admit: 2017-10-13 | Payer: Medicare Other | Source: Ambulatory Visit

## 2017-10-13 ENCOUNTER — Encounter (HOSPITAL_COMMUNITY): Admission: RE | Payer: Self-pay | Source: Ambulatory Visit

## 2017-10-13 DIAGNOSIS — G20 Parkinson's disease: Secondary | ICD-10-CM | POA: Diagnosis not present

## 2017-10-13 DIAGNOSIS — Z91041 Radiographic dye allergy status: Secondary | ICD-10-CM | POA: Diagnosis not present

## 2017-10-13 DIAGNOSIS — J449 Chronic obstructive pulmonary disease, unspecified: Secondary | ICD-10-CM | POA: Diagnosis not present

## 2017-10-13 DIAGNOSIS — Z87891 Personal history of nicotine dependence: Secondary | ICD-10-CM | POA: Diagnosis not present

## 2017-10-13 DIAGNOSIS — Z888 Allergy status to other drugs, medicaments and biological substances status: Secondary | ICD-10-CM | POA: Diagnosis not present

## 2017-10-13 DIAGNOSIS — N184 Chronic kidney disease, stage 4 (severe): Secondary | ICD-10-CM | POA: Diagnosis not present

## 2017-10-13 DIAGNOSIS — I129 Hypertensive chronic kidney disease with stage 1 through stage 4 chronic kidney disease, or unspecified chronic kidney disease: Secondary | ICD-10-CM | POA: Diagnosis not present

## 2017-10-13 DIAGNOSIS — Z79899 Other long term (current) drug therapy: Secondary | ICD-10-CM | POA: Diagnosis not present

## 2017-10-13 DIAGNOSIS — L219 Seborrheic dermatitis, unspecified: Secondary | ICD-10-CM | POA: Diagnosis not present

## 2017-10-13 DIAGNOSIS — K5900 Constipation, unspecified: Secondary | ICD-10-CM | POA: Diagnosis not present

## 2017-10-13 SURGERY — IR WITH ANESTHESIA
Anesthesia: General

## 2017-10-18 ENCOUNTER — Ambulatory Visit: Payer: Medicare Other | Attending: Specialist | Admitting: Physical Therapy

## 2017-10-18 DIAGNOSIS — R2681 Unsteadiness on feet: Secondary | ICD-10-CM | POA: Diagnosis not present

## 2017-10-18 DIAGNOSIS — M6281 Muscle weakness (generalized): Secondary | ICD-10-CM | POA: Diagnosis not present

## 2017-10-18 DIAGNOSIS — R29818 Other symptoms and signs involving the nervous system: Secondary | ICD-10-CM | POA: Insufficient documentation

## 2017-10-18 DIAGNOSIS — R29898 Other symptoms and signs involving the musculoskeletal system: Secondary | ICD-10-CM | POA: Insufficient documentation

## 2017-10-18 DIAGNOSIS — R2689 Other abnormalities of gait and mobility: Secondary | ICD-10-CM | POA: Insufficient documentation

## 2017-10-18 DIAGNOSIS — R262 Difficulty in walking, not elsewhere classified: Secondary | ICD-10-CM | POA: Diagnosis not present

## 2017-10-18 DIAGNOSIS — R293 Abnormal posture: Secondary | ICD-10-CM | POA: Insufficient documentation

## 2017-10-19 NOTE — Therapy (Signed)
[facility] [address]  [place], [place], [address] Phone: [phone]   Fax:  [phone]  Physical Therapy Evaluation  Patient Details  Name: [name] MRN: [id number] Date of Birth: Jan 31, 1927 Referring Provider: Basil Dess, MD  Encounter Date: 10/18/2017      PT End of Session - 10/18/17 1620    Visit Number 1   Number of Visits 16   Date for PT Re-Evaluation 12/16/17   Authorization Type [place] Medicare / BCBS   PT Start Time 1620   PT Stop Time 1705   PT Time Calculation (min) 45 min   Activity Tolerance Patient tolerated treatment well   Behavior During Therapy [facility] for tasks assessed/performed      Past Medical History:  Diagnosis Date  . Abdominal aortic aneurysm ([place]) 09/2008   s/o endovascular repair and angiogram 10/04/08   . Anemia    EGD Cscope 08-2012  . Bleeding ulcer 1997  . CKD (chronic kidney disease), stage IV (HCC)    Stable, Dr. Dimas Aguas  . COPD (chronic obstructive pulmonary disease) ([place])   . H/O hyperkalemia    Stable  . Hypertension   . Leukopenia 2/11   thought to be from doxy  . Renal cell carcinoma    clear cell type dx 10/09, s/p excision 10-24-08  . Renal insufficiency   . Solitary left kidney    W/ chronic tubule-interstitial damage, Dr. Dimas Aguas  . Spinal stenosis    causing LE weakness-persistant, being worked up by Hydrographic surveyor and neurologist    Past Surgical History:  Procedure Laterality Date  . ABDOMINAL AORTIC ANEURYSM REPAIR  2009   EVAR  . [place]  . IR RADIOLOGIST EVAL & MGMT  09/07/2017  . [place] (lip), 2004  . NEPHRECTOMY  2009   Open nephrectomy for cancer    There were no vitals filed for this visit.       Subjective Assessment - 10/18/17 1626    Currently in Pain? Yes   Pain Score 9    Pain Location Foot   Pain Orientation Right;Left   Pain Descriptors / Indicators Burning   Pain Type Chronic  pain   Pain Onset More than a month ago   Pain Frequency Intermittent   Aggravating Factors  up on feet    Pain Relieving Factors resting in bed   Effect of Pain on Daily Activities increased difficulty with walking            [facility] PT Assessment - 10/18/17 1620      Assessment   Medical Diagnosis Lumbar spinal stenosis, balance & coordination deficits, Parkinsonism   Referring Provider Basil Dess, MD   Onset Date/Surgical Date 10/05/17  most recent exacerbation   Next MD Visit 11/23/17   Prior Therapy multiple PT episodes over past few years for above diagnoses - most recent 03/2017-06/2017     Precautions   Precautions Fall     Balance Screen   Has the patient fallen in the past 6 months No   Has the patient had a decrease in activity level because of a fear of falling?  Yes   Is the patient reluctant to leave their home because of a fear of falling?  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     Posture/Postural Control   Posture/Postural Control Postural limitations   Postural Limitations Forward head;Rounded Shoulders;Increased thoracic kyphosis;Decreased lumbar lordosis;Flexed trunk     Strength   Overall Strength Comments tested in sitting   Right Hip Flexion 4-/5   Right Hip Extension 3-/5   Right Hip External Rotation  3+/5   Right Hip Internal Rotation 4-/5   Right Hip ABduction 4-/5   Right Hip ADduction 4-/5   Left Hip Flexion 3+/5   Left Hip Extension 3-/5   Left Hip External  Rotation 3+/5   Left Hip Internal Rotation 3+/5   Left Hip ABduction 4-/5   Left Hip ADduction 4-/5   Right Knee Flexion 4/5   Right Knee Extension 4+/5   Left Knee Flexion 4-/5   Left Knee Extension 4+/5   Right Ankle Dorsiflexion 4/5   Left Ankle Dorsiflexion 4/5     Transfers   Transfers Sit to Stand;Stand to Sit;Stand Pivot Transfers   Sit to Stand 4: Min guard;With upper extremity assist;Multiple attempts   Stand to Sit 4: Min guard;With upper extremity assist;Uncontrolled descent   Stand Pivot Transfers 4: Min guard   Comments Pt demonstrating increased dependence on UE assist with transfers as compared to prior PT episodes.     Ambulation/Gait   Ambulation/Gait Assistance 4: Min guard   Ambulation Distance (Feet) 80 Feet   Assistive device None   Gait Pattern Trunk flexed;Shuffle;Festinating;Poor foot clearance - left;Poor foot clearance - right;Decreased step length - right;Decreased step length - left;Decreased stride length;Decreased hip/knee flexion - left;Decreased hip/knee flexion - right;Decreased dorsiflexion - left;Decreased dorsiflexion - right   Ambulation Surface Level;Indoor   Gait velocity 1.49 ft/sec w/o AD   Gait Comments Pt's dtr, [name], reporting pt walking better today in PT than recently at home, but gait pattern signficantly worse than as of most recent discharge from PT. Discussed possible need for use of AD (probable RW) if this persists.     Standardized Balance Assessment   Standardized Balance Assessment Berg Balance Test;10 meter walk test;Timed Up and Go Test   10 Meter Walk 22.03"     Berg Balance Test   Sit to Stand Able to stand using hands after several tries   Standing Unsupported Able to stand safely 2 minutes   Sitting with Back Unsupported but Feet Supported on Floor or Stool Able to sit safely and securely 2 minutes   Stand to Sit Controls descent by using hands   Transfers Able to transfer safely, definite need of hands   Standing  Unsupported with Eyes Closed Able to stand 10 seconds with supervision   Standing Ubsupported with Feet Together Able to place feet together independently and stand for 1 minute with supervision   From Standing, Reach Forward with Outstretched Arm Can reach forward >5 cm safely (2")   From Standing Position, Pick up Object from Floor Unable to pick up and needs supervision   From Standing Position, Turn to Look Behind Over each Shoulder Needs supervision when turning   Turn 360 Degrees Needs close supervision  or verbal cueing   Standing Unsupported, Alternately Place Feet on Step/Stool Able to complete >2 steps/needs minimal assist   Standing Unsupported, One Foot in Front Needs help to step but can hold 15 seconds   Standing on One Leg Unable to try or needs assist to prevent fall   Total Score 29   Berg comment: < 36 high risk for falls (close to 100%); Score 26.7 - 39.6 = patient should use walker full-time     Timed Up and Go Test   Normal TUG (seconds) 31.12   TUG Comments >13.5 sec indicates high fall risk            Objective measurements completed on examination: See above findings.                    PT Short Term Goals - 10/18/17 1705      PT SHORT TERM GOAL #1   Title Independent with initial HEP as indicated   Status New   Target Date 11/11/17     PT SHORT TERM GOAL #2   Title Pt will imporve gait speed to >/= 1.8 ft/sec to reduce fall risk   Status New   Target Date 11/16/17           PT Long Term Goals - 10/18/17 1705      PT LONG TERM GOAL #1   Title Pt will be independent with advanced HEP as indicated   Status New   Target Date 12/16/17     PT LONG TERM GOAL #2   Title B LE strength to >/= 4/5 for improved function    Status New   Target Date 12/16/17     PT LONG TERM GOAL #3   Title Pt will demonstrate improvement on the Berg Balance Scale to 45/56 or greater to reduce risk for falls    Status New   Target Date 12/16/17      PT LONG TERM GOAL #4   Title Pt will improve gait speed to 2.6 ft/sec or greater for improved safety with community ambulation    Status New   Target Date 12/16/17     PT LONG TERM GOAL #5   Title Pt will complete TUG in </= 15 sec to improve safety with transitions   Status New   Target Date 12/16/17                Plan - 10/18/17 1705    Clinical Impression Statement [name] is an 83 y/o male who presents to OP PT for a high complexity eval for worsening of gait stability and progressively declining activity due to lumbar spinal stenosis and Parkinsonism. Pt and daughter report pt has been slowing down with decreased activity tolerance over the past few months preventing him from participating in the activities he normally enjoys such as going to the gym and walking on the treadmill and doing his exercises as he normally would. Pt has history of Parkinsonism along with low back pain from osteoarthritis and lumbar spinal stenosis which creates a sensation of "heaviness and tired feeling" in legs as well as a "bubbling" (burning) feeling in his feet, together which contribute to lack of coordination, decreased balance and gait instability. Pt has completed prior PT episodes for similar complaints, benefitting from PT during each episode, with the most recent episode completed in July 2018. Pt had 2 hospitalizations for pneumonia during the latter part of his last therapy episode, and although no further hospitalizations,  pt's dtr reports pt has not been able to keep up with the therapy activities as well as he has done following prior episodes. Since this time, he has demonstrated a decline in LE strength especially in the hips (previously 4/5 to  4+/5), a significant decrease in gait speed from 2.44 ft/sec to 1.49 ft/sec, TUG time more than doubled from 13.47 sec to 31.12 sec, and a decline in balance per the Berg to 29/56 from 46/56 indicating a nearly 100% risk for falls. Pt denies low back  pain at present, but complains of persistent heaviness and tired feeling from the waist down to the toes and the "bubbling" feeling in his feet as described above. Given prior positive response to PT and higher PLOF, pt demonstrates good potential to benefit from skilled PT with POC to focus on core/proximal LE strengthening to improve posture and lumbar support as well as balance, along with balance training and dynamic gait activities to decrease risk for falls.   History and Personal Factors relevant to plan of care: Advanced age, Parkinsonism, OA of mutiple joints, lumbar stenosis, LE neuropathy, HTN, COPD, CKD   Clinical Presentation Unstable   Clinical Presentation due to: worsening of mobility, gait and balance with more rapid decline than on previous episodes   Clinical Decision Making High   Rehab Potential Good   Clinical Impairments Affecting Rehab Potential Advanced age, Parkinsonism, OA of mutiple joints, lumbar stenosis, LE neuropathy, HTN, COPD, CKD   PT Frequency 2x / week   PT Duration 8 weeks   PT Treatment/Interventions Patient/family education;Neuromuscular re-education;Balance training;Therapeutic exercise;Therapeutic activities;Functional mobility training;Gait training;Manual techniques;Taping;Dry needling;Electrical Stimulation;Moist Heat;Cryotherapy;ADLs/Self Care Home Management   Consulted and Agree with Plan of Care Patient;Family member/caregiver   Family Member Consulted Dtr - [name]      Patient will benefit from skilled therapeutic intervention in order to improve the following deficits and impairments:  Abnormal gait, Difficulty walking, Decreased activity tolerance, Decreased balance, Decreased coordination, Decreased safety awareness, Decreased strength, Impaired flexibility, Decreased range of motion, Postural dysfunction, Improper body mechanics, Pain, Impaired sensation  Visit Diagnosis: Other abnormalities of gait and mobility  Difficulty in walking, not  elsewhere classified  Unsteadiness on feet  Muscle weakness (generalized)  Other symptoms and signs involving the musculoskeletal system  Other symptoms and signs involving the nervous system  Abnormal posture      G-Codes - Nov 14, 2017 1705    Functional Assessment Tool Used (Outpatient Only) Berg & TUG + clinical judgement   Functional Limitation Mobility: Walking and moving around   Mobility: Walking and Moving Around Current Status [phone]) At least 60 percent but less than 80 percent impaired, limited or restricted   Mobility: Walking and Moving Around Goal Status [phone]) At least 40 percent but less than 60 percent impaired, limited or restricted       Problem List Patient Active Problem List   Diagnosis Date Noted  . Parkinsonism ([place]) 07/10/2017  . Status post dilatation of esophageal stricture 05/15/2017  . Pancytopenia ([place]) 03/31/2016  . Follow-up --- PCP NOTES 09/02/2015  . Neuropathy 06/10/2015  . Pedal edema 01/30/2015  . Hip pain, chronic 01/30/2015  . Back pain 01/30/2015  . Cramps of lower extremity 01/30/2015  . Aftercare following surgery of the circulatory system, [place] 08/05/2014  . Annual physical exam 11-15-2011  . DJD (degenerative joint disease) 12/14/2010  . Anemia 06/06/2009  . COPD (chronic obstructive pulmonary disease) ([place]) 06/06/2009  . RENAL CELL CANCER 12/25/2008  . HTN (hypertension) 12/25/2008  . AAA (  abdominal aortic aneurysm) without rupture ([place]) 12/25/2008  . CKD (chronic kidney disease) 12/25/2008  . HYPERCHOLESTEROLEMIA 03/01/2008  . GERD 03/01/2008  . ACNE ROSACEA 11/29/2007  . DIVERTICULOSIS, COLON 04/12/2007    Percival Spanish, PT, MPT 10/19/2017, 1:32 PM  [facility] [address]  [place], [place], [address] Phone: [phone]   Fax:  [phone]  Name: [name] MRN: [id number] Date of Birth: 06-12-1927

## 2017-10-24 ENCOUNTER — Telehealth (HOSPITAL_COMMUNITY): Payer: Self-pay | Admitting: Radiology

## 2017-10-24 NOTE — Telephone Encounter (Signed)
Called pt's daughter, left VM for her to call me with some dates that they are available to schedule her father's procedure. JM

## 2017-10-25 ENCOUNTER — Ambulatory Visit: Payer: Medicare Other | Admitting: Physical Therapy

## 2017-10-25 DIAGNOSIS — R262 Difficulty in walking, not elsewhere classified: Secondary | ICD-10-CM

## 2017-10-25 DIAGNOSIS — R29818 Other symptoms and signs involving the nervous system: Secondary | ICD-10-CM

## 2017-10-25 DIAGNOSIS — R29898 Other symptoms and signs involving the musculoskeletal system: Secondary | ICD-10-CM | POA: Diagnosis not present

## 2017-10-25 DIAGNOSIS — R2689 Other abnormalities of gait and mobility: Secondary | ICD-10-CM

## 2017-10-25 DIAGNOSIS — R293 Abnormal posture: Secondary | ICD-10-CM

## 2017-10-25 DIAGNOSIS — R2681 Unsteadiness on feet: Secondary | ICD-10-CM | POA: Diagnosis not present

## 2017-10-25 DIAGNOSIS — M6281 Muscle weakness (generalized): Secondary | ICD-10-CM | POA: Diagnosis not present

## 2017-10-25 NOTE — Therapy (Signed)
[facility] [address]  [place], [place], [address] Phone: [phone]   Fax:  [phone]  Physical Therapy Evaluation  Patient Details  Name: [name] MRN: [id number] Date of Birth: [birth year] Referring Provider: Basil Dess, MD  Encounter Date: 10/25/2017      PT End of Session - 10/25/17 1530    Visit Number 2   Number of Visits 16   Date for PT Re-Evaluation 12/16/17   Authorization Type [place] Medicare / BCBS   PT Start Time 6010   PT Stop Time 1635   PT Time Calculation (min) 65 min   Activity Tolerance Patient tolerated treatment well   Behavior During Therapy [facility] for tasks assessed/performed      Past Medical History:  Diagnosis Date  . Abdominal aortic aneurysm ([place]) 09/2008   s/o endovascular repair and angiogram 10/04/08   . Anemia    EGD Cscope 08-2012  . Bleeding ulcer 1997  . CKD (chronic kidney disease), stage IV (HCC)    Stable, Dr. Dimas Aguas  . COPD (chronic obstructive pulmonary disease) ([place])   . H/O hyperkalemia    Stable  . Hypertension   . Leukopenia 2/11   thought to be from doxy  . Renal cell carcinoma    clear cell type dx 10/09, s/p excision 10-24-08  . Renal insufficiency   . Solitary left kidney    W/ chronic tubule-interstitial damage, Dr. Dimas Aguas  . Spinal stenosis    causing LE weakness-persistant, being worked up by Hydrographic surveyor and neurologist    Past Surgical History:  Procedure Laterality Date  . ABDOMINAL AORTIC ANEURYSM REPAIR  2009   EVAR  . [place]  . IR RADIOLOGIST EVAL & MGMT  09/07/2017  . [place] ([place]), 2004  . NEPHRECTOMY  2009   Open nephrectomy for cancer    There were no vitals filed for this visit.       Subjective Assessment - 10/25/17 1536    Subjective Pt's dtr reporting they are working on adjusting pt's dosing on Sinemet and pt has been reporting increased pain in legs (mostly thighs) and  still has "bubbling" pain in feet.   Patient Stated Goals "walking better"   Pain Score 9    Pain Location Leg   Pain Orientation Right;Left   Pain Descriptors / Indicators Aching   Pain Type Chronic pain   Pain Onset More than a month ago   Pain Frequency Intermittent                Objective measurements completed on examination: See above findings.          [facility] Adult PT Treatment/Exercise - 10/25/17 1530      Self-Care   Self-Care Other Self-Care Comments   Other Self-Care Comments  Pt & dtr instructed in use of roller stick (rolling pin) in sitting for working out muscle tension/cramping in HS.     Exercises   Exercises Lumbar     Lumbar Exercises: Stretches   Passive Hamstring Stretch 60 seconds;1 rep   Passive Hamstring Stretch Limitations manual with STM to HS   Single Knee to Chest Stretch 30 seconds;1 rep   Single Knee to Chest Stretch Limitations with opp LE straight for slight hip flexor stretch   Piriformis Stretch 30 seconds;2 reps   Piriformis Stretch Limitations KTOS & figure 4 with slight overpressure     Lumbar Exercises: Supine  Clam 10 reps;3 seconds   Clam Limitations alt hip ABD/ER with red TB   Bent Knee Raise 10 reps;3 seconds   Bent Knee Raise Limitations brace marching   Bridge 10 reps;3 seconds   Bridge Limitations limited lift     Knee/Hip Exercises: Aerobic   Nustep lvl 3 x 6'     Knee/Hip Exercises: Seated   Clamshell with TheraBand Red  10x3" - alt hip ABD/ER   Marching Both;10 reps   Marching Limitations looped red TB at knees     Modalities   Modalities Electrical Stimulation;Moist Heat     Moist Heat Therapy   Number Minutes Moist Heat 15 Minutes   Moist Heat Location Lumbar Spine     Electrical Stimulation   Electrical Stimulation Location lumbar spine   Electrical Stimulation Action IFC   Electrical Stimulation Parameters 80-150 Hz, intensity to pt tol x 15'   Electrical Stimulation Goals Pain  radicular  symptoms                  PT Short Term Goals - 10/25/17 1541      PT SHORT TERM GOAL #1   Title Independent with initial HEP as indicated   Status On-going     PT SHORT TERM GOAL #2   Title Pt will imporve gait speed to >/= 1.8 ft/sec to reduce fall risk   Status On-going           PT Long Term Goals - 10/25/17 1541      PT LONG TERM GOAL #1   Title Pt will be independent with advanced HEP as indicated   Status On-going     PT LONG TERM GOAL #2   Title B LE strength to >/= 4/5 for improved function    Status On-going     PT LONG TERM GOAL #3   Title Pt will demonstrate improvement on the Berg Balance Scale to 45/56 or greater to reduce risk for falls    Status On-going     PT LONG TERM GOAL #4   Title Pt will improve gait speed to 2.6 ft/sec or greater for improved safety with community ambulation    Status On-going     PT LONG TERM GOAL #5   Title Pt will complete TUG in </= 15 sec to improve safety with transitions   Status On-going                Plan - 10/25/17 1615    Clinical Impression Statement Pt reporting increased intensity of pain in B LEs today, not just in "bubbling" in feet but extending down B posterior LEs with greatest intensity in thighs. Treatment focus on addressing muscular tightness and apparent radicular symtoms from lumbar spine with stretching, STM and core & proximal LE strengthening. Pt reporting pain down to 5/10 after completing exercises/stretches. Treatment concluded with trial of IFC estim + moist heat to lumbar spine in attempt to further reduce pain and radicular symptoms, with pt reporting "100% better" following estim. Given postive response to estim will look into insurance coverage for home TENS/IT unit as well as lumbar electrode garment given pt's limited dexterity to promote ease of electrode placement for home use.   Rehab Potential Good   Clinical Impairments Affecting Rehab Potential Advanced age,  Parkinsonism, OA of mutiple joints, lumbar stenosis, LE neuropathy, HTN, COPD, CKD   PT Treatment/Interventions Patient/family education;Neuromuscular re-education;Balance training;Therapeutic exercise;Therapeutic activities;Functional mobility training;Gait training;Manual techniques;Taping;Dry needling;Electrical Stimulation;Moist Heat;Cryotherapy;ADLs/Self Care Home Management  Consulted and Agree with Plan of Care Patient;Family member/caregiver   Family Member Consulted Dtr - [name]      Patient will benefit from skilled therapeutic intervention in order to improve the following deficits and impairments:  Abnormal gait, Difficulty walking, Decreased activity tolerance, Decreased balance, Decreased coordination, Decreased safety awareness, Decreased strength, Impaired flexibility, Decreased range of motion, Postural dysfunction, Improper body mechanics, Pain, Impaired sensation  Visit Diagnosis: Other abnormalities of gait and mobility  Difficulty in walking, not elsewhere classified  Unsteadiness on feet  Muscle weakness (generalized)  Other symptoms and signs involving the musculoskeletal system  Other symptoms and signs involving the nervous system  Abnormal posture     Problem List Patient Active Problem List   Diagnosis Date Noted  . Parkinsonism ([place]) 07/10/2017  . Status post dilatation of esophageal stricture 05/15/2017  . Pancytopenia ([place]) 03/31/2016  . Follow-up --- PCP NOTES 09/02/2015  . Neuropathy 06/10/2015  . Pedal edema 01/30/2015  . Hip pain, chronic 01/30/2015  . Back pain 01/30/2015  . Cramps of lower extremity 01/30/2015  . Aftercare following surgery of the circulatory system, [place] 08/05/2014  . Annual physical exam 10/19/2011  . DJD (degenerative joint disease) 12/14/2010  . Anemia 06/06/2009  . COPD (chronic obstructive pulmonary disease) ([place]) 06/06/2009  . RENAL CELL CANCER 12/25/2008  . HTN (hypertension) 12/25/2008  . AAA (abdominal aortic  aneurysm) without rupture ([place]) 12/25/2008  . CKD (chronic kidney disease) 12/25/2008  . HYPERCHOLESTEROLEMIA 03/01/2008  . GERD 03/01/2008  . ACNE ROSACEA 11/29/2007  . DIVERTICULOSIS, COLON 04/12/2007    Percival Spanish, PT, MPT 10/25/2017, 6:20 PM  [facility] [address]  [place], [place], [address] Phone: [phone]   Fax:  [phone]  Name: [name] MRN: [id number] Date of Birth: 04/28/1927

## 2017-10-26 DIAGNOSIS — Z87891 Personal history of nicotine dependence: Secondary | ICD-10-CM | POA: Diagnosis not present

## 2017-10-26 DIAGNOSIS — G609 Hereditary and idiopathic neuropathy, unspecified: Secondary | ICD-10-CM | POA: Diagnosis not present

## 2017-10-26 DIAGNOSIS — G20 Parkinson's disease: Secondary | ICD-10-CM | POA: Diagnosis not present

## 2017-10-26 DIAGNOSIS — I10 Essential (primary) hypertension: Secondary | ICD-10-CM | POA: Diagnosis not present

## 2017-10-26 DIAGNOSIS — Z9181 History of falling: Secondary | ICD-10-CM | POA: Diagnosis not present

## 2017-10-26 DIAGNOSIS — Z79899 Other long term (current) drug therapy: Secondary | ICD-10-CM | POA: Diagnosis not present

## 2017-10-26 DIAGNOSIS — G309 Alzheimer's disease, unspecified: Secondary | ICD-10-CM | POA: Diagnosis not present

## 2017-10-26 DIAGNOSIS — R2681 Unsteadiness on feet: Secondary | ICD-10-CM | POA: Diagnosis not present

## 2017-10-27 ENCOUNTER — Ambulatory Visit: Payer: Medicare Other | Attending: Specialist | Admitting: Physical Therapy

## 2017-10-27 DIAGNOSIS — R262 Difficulty in walking, not elsewhere classified: Secondary | ICD-10-CM | POA: Insufficient documentation

## 2017-10-27 DIAGNOSIS — M6281 Muscle weakness (generalized): Secondary | ICD-10-CM | POA: Diagnosis not present

## 2017-10-27 DIAGNOSIS — R29818 Other symptoms and signs involving the nervous system: Secondary | ICD-10-CM | POA: Insufficient documentation

## 2017-10-27 DIAGNOSIS — R2689 Other abnormalities of gait and mobility: Secondary | ICD-10-CM | POA: Insufficient documentation

## 2017-10-27 DIAGNOSIS — R293 Abnormal posture: Secondary | ICD-10-CM | POA: Diagnosis not present

## 2017-10-27 DIAGNOSIS — R2681 Unsteadiness on feet: Secondary | ICD-10-CM | POA: Diagnosis not present

## 2017-10-27 DIAGNOSIS — R29898 Other symptoms and signs involving the musculoskeletal system: Secondary | ICD-10-CM | POA: Insufficient documentation

## 2017-10-27 NOTE — Therapy (Signed)
[facility] [address]  [place], [place], [address] Phone: [phone]   Fax:  [phone]  Physical Therapy Treatment  Patient Details  Name: [name] MRN: [id number] Date of Birth: May 25, 1927 Referring Provider: Basil Dess, MD  Encounter Date: 10/27/2017      PT End of Session - 10/27/17 1413    Visit Number 3   Number of Visits 16   Date for PT Re-Evaluation 12/16/17   Authorization Type [place]X Medicare / BCBS   PT Start Time 1400   PT Stop Time 1452   PT Time Calculation (min) 52 min   Activity Tolerance Patient tolerated treatment well   Behavior During Therapy [facility] for tasks assessed/performed      Past Medical History:  Diagnosis Date  . Abdominal aortic aneurysm ([place]) 09/2008   s/o endovascular repair and angiogram 10/04/08   . Anemia    EGD Cscope 08-2012  . Bleeding ulcer 1997  . CKD (chronic kidney disease), stage IV (HCC)    Stable, Dr. Dimas Aguas  . COPD (chronic obstructive pulmonary disease) ([place])   . H/O hyperkalemia    Stable  . Hypertension   . Leukopenia 2/11   thought to be from doxy  . Renal cell carcinoma    clear cell type dx 10/09, s/p excision 10-24-08  . Renal insufficiency   . Solitary left kidney    W/ chronic tubule-interstitial damage, Dr. Dimas Aguas  . Spinal stenosis    causing LE weakness-persistant, being worked up by Hydrographic surveyor and neurologist    Past Surgical History:  Procedure Laterality Date  . ABDOMINAL AORTIC ANEURYSM REPAIR  2009   EVAR  . [place]  . IR RADIOLOGIST EVAL & MGMT  09/07/2017  . [place] ([place]), 2004  . NEPHRECTOMY  2009   Open nephrectomy for cancer    There were no vitals filed for this visit.      Subjective Assessment - 10/27/17 1410    Subjective Pt reporting relief of LE pain and "bubbling" in feet for several hours after use of estim during last session, returning around bedtime.   Patient  Stated Goals "walking better"   Currently in Pain? Yes   Pain Score 5    Pain Location Foot   Pain Orientation Right;Left   Pain Descriptors / Indicators --  "bubbling"   Pain Type Acute pain                         [facility] Adult PT Treatment/Exercise - 10/27/17 1400      Self-Care   Self-Care Other Self-Care Comments   Other Self-Care Comments  Pt & dtr instructed in set-up and use of home TENS/MT unit incuding recommended frequency and wearing time - 2x/day PRN for no >30 min at a time.     Exercises   Exercises Lumbar     Lumbar Exercises: Stretches   Passive Hamstring Stretch 60 seconds;1 rep;30 seconds;2 reps   Passive Hamstring Stretch Limitations manual with STM to HS; seated with heel on 9" stool & floor (better tolerated on floor)   Piriformis Stretch 30 seconds;2 reps   Piriformis Stretch Limitations KTOS - supine & seated     Knee/Hip Exercises: Aerobic   Nustep lvl 3 x 6'     Modalities   Modalities Electrical Stimulation;Moist Heat     Moist Heat Therapy   Number Minutes Moist  Heat 15 Minutes   Moist Heat Location Lumbar Spine     Electrical Stimulation   Electrical Stimulation Location lumbar spine   Electrical Stimulation Action TENS   Electrical Stimulation Parameters SD, intensity to pt tol x 15'   Electrical Stimulation Goals Pain  radicular symptoms                PT Education - 10/27/17 1445    Education provided Yes   Education Details Initial HEP - seated HS & KTOS piriformis stretches; instruction in set-up & use of home TENS/MT unit   Person(s) Educated Patient;Child(ren)  dtr [name]   Methods Explanation;Demonstration   Comprehension Verbalized understanding;Need further instruction          PT Short Term Goals - 10/25/17 1541      PT SHORT TERM GOAL #1   Title Independent with initial HEP as indicated   Status On-going     PT SHORT TERM GOAL #2   Title Pt will imporve gait speed to >/= 1.8 ft/sec to  reduce fall risk   Status On-going           PT Long Term Goals - 10/25/17 1541      PT LONG TERM GOAL #1   Title Pt will be independent with advanced HEP as indicated   Status On-going     PT LONG TERM GOAL #2   Title B LE strength to >/= 4/5 for improved function    Status On-going     PT LONG TERM GOAL #3   Title Pt will demonstrate improvement on the Berg Balance Scale to 45/56 or greater to reduce risk for falls    Status On-going     PT LONG TERM GOAL #4   Title Pt will improve gait speed to 2.6 ft/sec or greater for improved safety with community ambulation    Status On-going     PT LONG TERM GOAL #5   Title Pt will complete TUG in </= 15 sec to improve safety with transitions   Status On-going               Plan - 10/27/17 1413    Clinical Impression Statement Pt reporting benefit from estim with resolution of radicular nerve symptoms of "bubbling" in his feet lasting until around the time he went to bed that night. Insurance coverage allowing for home TENS/MT unit & [employer] rep willing to donate lumbar electrode garment, although garment will not be available until end of next week. Provided training to pt and dtr in set-up and use of home TENS/MT unit, recommending use 2x/day PRN for up to 30 minutes per session. Pt and dtr also provided with home stretching program for hamstrings and piriformis to be performed in sitting.   Rehab Potential Good   Clinical Impairments Affecting Rehab Potential Advanced age, Parkinsonism, OA of mutiple joints, lumbar stenosis, LE neuropathy, HTN, COPD, CKD   PT Treatment/Interventions Patient/family education;Neuromuscular re-education;Balance training;Therapeutic exercise;Therapeutic activities;Functional mobility training;Gait training;Manual techniques;Taping;Dry needling;Electrical Stimulation;Moist Heat;Cryotherapy;ADLs/Self Care Home Management   Consulted and Agree with Plan of Care Patient;Family member/caregiver   Family  Member Consulted Dtr - [name]      Patient will benefit from skilled therapeutic intervention in order to improve the following deficits and impairments:  Abnormal gait, Difficulty walking, Decreased activity tolerance, Decreased balance, Decreased coordination, Decreased safety awareness, Decreased strength, Impaired flexibility, Decreased range of motion, Postural dysfunction, Improper body mechanics, Pain, Impaired sensation  Visit Diagnosis: Other abnormalities of gait and  mobility  Difficulty in walking, not elsewhere classified  Unsteadiness on feet  Muscle weakness (generalized)  Other symptoms and signs involving the musculoskeletal system  Other symptoms and signs involving the nervous system  Abnormal posture     Problem List Patient Active Problem List   Diagnosis Date Noted  . Parkinsonism ([place]) 07/10/2017  . Status post dilatation of esophageal stricture 05/15/2017  . Pancytopenia ([place]) 03/31/2016  . Follow-up --- PCP NOTES 09/02/2015  . Neuropathy 06/10/2015  . Pedal edema 01/30/2015  . Hip pain, chronic 01/30/2015  . Back pain 01/30/2015  . Cramps of lower extremity 01/30/2015  . Aftercare following surgery of the circulatory system, [place] 08/05/2014  . Annual physical exam 10/19/2011  . DJD (degenerative joint disease) 12/14/2010  . Anemia 06/06/2009  . COPD (chronic obstructive pulmonary disease) ([place]) 06/06/2009  . RENAL CELL CANCER 12/25/2008  . HTN (hypertension) 12/25/2008  . AAA (abdominal aortic aneurysm) without rupture ([place]) 12/25/2008  . CKD (chronic kidney disease) 12/25/2008  . HYPERCHOLESTEROLEMIA 03/01/2008  . GERD 03/01/2008  . ACNE ROSACEA 11/29/2007  . DIVERTICULOSIS, COLON 04/12/2007    Percival Spanish, PT, MPT 10/27/2017, 6:34 PM  [facility] [address]  [place], [place], [address] Phone: [phone]   Fax:  [phone]  Name: [name] MRN: [id number] Date  of Birth: 08-27-1927

## 2017-10-28 ENCOUNTER — Telehealth (HOSPITAL_COMMUNITY): Payer: Self-pay | Admitting: Radiology

## 2017-10-28 NOTE — Telephone Encounter (Signed)
Called pt's daughter, left VM that we will be scheduling her father's procedure with Dr. Laurence Ferrari on 11/24/17. He will need to be admitted on 11/23/17 and his treatment will be on 11/24/17. He should be discharged by 11/25/17, if all does well. I told her I would send them appt info out in the mail and asked her to call me back. I will call her with more details as they arise. JM

## 2017-11-01 ENCOUNTER — Ambulatory Visit: Payer: Medicare Other | Admitting: Physical Therapy

## 2017-11-01 ENCOUNTER — Encounter: Payer: Self-pay | Admitting: Physical Therapy

## 2017-11-01 DIAGNOSIS — M6281 Muscle weakness (generalized): Secondary | ICD-10-CM

## 2017-11-01 DIAGNOSIS — R29898 Other symptoms and signs involving the musculoskeletal system: Secondary | ICD-10-CM | POA: Diagnosis not present

## 2017-11-01 DIAGNOSIS — R2689 Other abnormalities of gait and mobility: Secondary | ICD-10-CM | POA: Diagnosis not present

## 2017-11-01 DIAGNOSIS — R29818 Other symptoms and signs involving the nervous system: Secondary | ICD-10-CM | POA: Diagnosis not present

## 2017-11-01 DIAGNOSIS — R262 Difficulty in walking, not elsewhere classified: Secondary | ICD-10-CM

## 2017-11-01 DIAGNOSIS — R2681 Unsteadiness on feet: Secondary | ICD-10-CM | POA: Diagnosis not present

## 2017-11-01 DIAGNOSIS — R293 Abnormal posture: Secondary | ICD-10-CM

## 2017-11-01 NOTE — Therapy (Signed)
[facility] [address]  [place], [place], [address] Phone: [phone]   Fax:  [phone]  Physical Therapy Treatment  Patient Details  Name: [name] MRN: [id number] Date of Birth: 1927/11/14 Referring Provider: Basil Dess, MD   Encounter Date: 11/01/2017  PT End of Session - 11/01/17 1521    Visit Number  4    Number of Visits  16    Date for PT Re-Evaluation  12/16/17    Authorization Type  [place] Medicare / BCBS    PT Start Time  1521    PT Stop Time  1620    PT Time Calculation (min)  59 min    Activity Tolerance  Patient tolerated treatment well    Behavior During Therapy  [facility] for tasks assessed/performed       Past Medical History:  Diagnosis Date  . Abdominal aortic aneurysm ([place]) 09/2008   s/o endovascular repair and angiogram 10/04/08   . Anemia    EGD Cscope 08-2012  . Bleeding ulcer 1997  . CKD (chronic kidney disease), stage IV (HCC)    Stable, Dr. Dimas Aguas  . COPD (chronic obstructive pulmonary disease) ([place])   . H/O hyperkalemia    Stable  . Hypertension   . Leukopenia 2/11   thought to be from doxy  . Renal cell carcinoma    clear cell type dx 10/09, s/p excision 10-24-08  . Renal insufficiency   . Solitary left kidney    W/ chronic tubule-interstitial damage, Dr. Dimas Aguas  . Spinal stenosis    causing LE weakness-persistant, being worked up by Hydrographic surveyor and neurologist    Past Surgical History:  Procedure Laterality Date  . ABDOMINAL AORTIC ANEURYSM REPAIR  2009   EVAR  . [place]  . IR RADIOLOGIST EVAL & MGMT  09/07/2017  . [place] 2004  . NEPHRECTOMY  2009   Open nephrectomy for cancer    There were no vitals filed for this visit.  Subjective Assessment - 11/01/17 1528    Subjective  Pt reporting TENS/MT unit does not seem to give him as much relief as the clinic estim unit.    Patient Stated Goals  "walking better"    Currently in Pain?  Yes    Pain Score  9     Pain Location  Foot    Pain Orientation  Right;Left    Pain Descriptors / Indicators  -- "bubbling/boiling"   "bubbling/boiling"   Pain Type  Acute pain;Chronic pain    Pain Onset  More than a month ago    Pain Frequency  Intermittent                      [facility] Adult PT Treatment/Exercise - 11/01/17 1521      Exercises   Exercises  Lumbar      Lumbar Exercises: Stretches   Passive Hamstring Stretch  30 seconds;2 reps    Passive Hamstring Stretch Limitations  seated with heel on floor    Single Knee to Chest Stretch  30 seconds;2 reps    Single Knee to Chest Stretch Limitations  with opp LE straight for slight hip flexor stretch    Piriformis Stretch  30 seconds;2 reps    Piriformis Stretch Limitations  seated KTOS      Lumbar Exercises: Supine   Clam  10 reps;3 seconds    Clam Limitations  alt  hip ABD/ER with red TB    Bent Knee Raise  10 reps;3 seconds    Bent Knee Raise Limitations  brace marching with red TB    Bridge  5 seconds 12 reps   12 reps   Bridge Limitations  limited lift      Knee/Hip Exercises: Aerobic   Nustep  lvl 4 x 6'      Shoulder Exercises: Seated   Horizontal ABduction  Both;10 reps;Theraband;Strengthening    Theraband Level (Shoulder Horizontal ABduction)  Level 2 (Red)    Other Seated Exercises  seated scap retraction + alt shoulder flexion & opp shoulder extension with red TB x10      Modalities   Modalities  Electrical Stimulation;Moist Heat      Moist Heat Therapy   Number Minutes Moist Heat  15 Minutes    Moist Heat Location  Lumbar Spine      Electrical Stimulation   Electrical Stimulation Location  lumbar spine    Electrical Stimulation Action  TENS    Electrical Stimulation Parameters  MRW - 150Hz , intensity to pt tol x 15'    Electrical Stimulation Goals  Pain radicular symptoms   radicular symptoms              PT Short Term Goals - 10/25/17 1541      PT  SHORT TERM GOAL #1   Title  Independent with initial HEP as indicated    Status  On-going      PT SHORT TERM GOAL #2   Title  Pt will imporve gait speed to >/= 1.8 ft/sec to reduce fall risk    Status  On-going        PT Long Term Goals - 10/25/17 1541      PT LONG TERM GOAL #1   Title  Pt will be independent with advanced HEP as indicated    Status  On-going      PT LONG TERM GOAL #2   Title  B LE strength to >/= 4/5 for improved function     Status  On-going      PT LONG TERM GOAL #3   Title  Pt will demonstrate improvement on the Berg Balance Scale to 45/56 or greater to reduce risk for falls     Status  On-going      PT LONG TERM GOAL #4   Title  Pt will improve gait speed to 2.6 ft/sec or greater for improved safety with community ambulation     Status  On-going      PT LONG TERM GOAL #5   Title  Pt will complete TUG in </= 15 sec to improve safety with transitions    Status  On-going            Plan - 11/01/17 1532    Clinical Impression Statement  Pt and dtr reporting no issues with HEP stretches provided at last visit. Good tolerance for supine LE strengthening but some difficulty with attempt at seated postural exercises d/t c/o neck pain/cramping. Pt & dtr reporting less benefit noted from home TENS/MT unit and questioning proper set-up vs positioning. Reviewed set-up and changed mode to MRW in place of SD to see if able to acheive more benefit as well as utilized unit in sitting vs supine to better replicate how pt more like to use unit at home. Pt noting benefit after treatment and will f/u on next visit to determine duration of benefit.     Rehab Potential  Good    Clinical Impairments Affecting Rehab Potential  Advanced age, Parkinsonism, OA of mutiple joints, lumbar stenosis, LE neuropathy, HTN, COPD, CKD    PT Treatment/Interventions  Patient/family education;Neuromuscular re-education;Balance training;Therapeutic exercise;Therapeutic activities;Functional  mobility training;Gait training;Manual techniques;Taping;Dry needling;Electrical Stimulation;Moist Heat;Cryotherapy;ADLs/Self Care Home Management    Consulted and Agree with Plan of Care  Patient;Family member/caregiver    Family Member Consulted  Dtr - [name]       Patient will benefit from skilled therapeutic intervention in order to improve the following deficits and impairments:  Abnormal gait, Difficulty walking, Decreased activity tolerance, Decreased balance, Decreased coordination, Decreased safety awareness, Decreased strength, Impaired flexibility, Decreased range of motion, Postural dysfunction, Improper body mechanics, Pain, Impaired sensation  Visit Diagnosis: Other abnormalities of gait and mobility  Difficulty in walking, not elsewhere classified  Unsteadiness on feet  Muscle weakness (generalized)  Other symptoms and signs involving the musculoskeletal system  Other symptoms and signs involving the nervous system  Abnormal posture     Problem List Patient Active Problem List   Diagnosis Date Noted  . Parkinsonism ([place]) 07/10/2017  . Status post dilatation of esophageal stricture 05/15/2017  . Pancytopenia ([place]) 03/31/2016  . Follow-up --- PCP NOTES 09/02/2015  . Neuropathy 06/10/2015  . Pedal edema 01/30/2015  . Hip pain, chronic 01/30/2015  . Back pain 01/30/2015  . Cramps of lower extremity 01/30/2015  . Aftercare following surgery of the circulatory system, [place] 08/05/2014  . Annual physical exam 10/19/2011  . DJD (degenerative joint disease) 12/14/2010  . Anemia 06/06/2009  . COPD (chronic obstructive pulmonary disease) ([place]) 06/06/2009  . RENAL CELL CANCER 12/25/2008  . HTN (hypertension) 12/25/2008  . AAA (abdominal aortic aneurysm) without rupture ([place]) 12/25/2008  . CKD (chronic kidney disease) 12/25/2008  . HYPERCHOLESTEROLEMIA 03/01/2008  . GERD 03/01/2008  . ACNE ROSACEA 11/29/2007  . DIVERTICULOSIS, COLON 04/12/2007    Percival Spanish,  PT, MPT 11/01/2017, 6:23 PM  [facility] [address]  [place], [place], [address] Phone: [phone]   Fax:  [phone]  Name: [name] MRN: [id number] Date of Birth: 05/01/1927

## 2017-11-02 ENCOUNTER — Ambulatory Visit (INDEPENDENT_AMBULATORY_CARE_PROVIDER_SITE_OTHER): Payer: Medicare Other | Admitting: Internal Medicine

## 2017-11-02 ENCOUNTER — Encounter: Payer: Self-pay | Admitting: Internal Medicine

## 2017-11-02 VITALS — BP 122/70 | HR 69 | Temp 97.9°F | Resp 14 | Ht 69.0 in | Wt 160.2 lb

## 2017-11-02 DIAGNOSIS — I10 Essential (primary) hypertension: Secondary | ICD-10-CM | POA: Diagnosis not present

## 2017-11-02 DIAGNOSIS — Z23 Encounter for immunization: Secondary | ICD-10-CM | POA: Diagnosis not present

## 2017-11-02 DIAGNOSIS — N189 Chronic kidney disease, unspecified: Secondary | ICD-10-CM | POA: Diagnosis not present

## 2017-11-02 DIAGNOSIS — I714 Abdominal aortic aneurysm, without rupture, unspecified: Secondary | ICD-10-CM

## 2017-11-02 DIAGNOSIS — G20 Parkinson's disease: Secondary | ICD-10-CM | POA: Diagnosis not present

## 2017-11-02 NOTE — Patient Instructions (Signed)
   GO TO THE FRONT DESK Schedule your next appointment for a  Check up in 6 months  

## 2017-11-02 NOTE — Progress Notes (Signed)
Pre visit review using our clinic review tool, if applicable. No additional management support is needed unless otherwise documented below in the visit note. 

## 2017-11-02 NOTE — Progress Notes (Signed)
Subjective:    Patient ID: [name], male    DOB: 1927-01-09, 83 y.o.   MRN: [id number]  DOS:  11/02/2017 Type of visit - description : f/u Interval history: Here w/ [name], his daughter  Since the last visit, has seen multiple specialists, she gave me a brief report on each visit. Medication list updated No major concerns  Review of Systems No fever chills Occasional edema in the ankles at the end of the day No cough Appetite is okay   Past Medical History:  Diagnosis Date  . Abdominal aortic aneurysm ([place]) 09/2008   s/o endovascular repair and angiogram 10/04/08   . Anemia    EGD Cscope 08-2012  . Bleeding ulcer 1997  . CKD (chronic kidney disease), stage IV (HCC)    Stable, Dr. Dimas Aguas  . COPD (chronic obstructive pulmonary disease) ([place])   . H/O hyperkalemia    Stable  . Hypertension   . Leukopenia 2/11   thought to be from doxy  . Renal cell carcinoma    clear cell type dx 10/09, s/p excision 10-24-08  . Renal insufficiency   . Solitary left kidney    W/ chronic tubule-interstitial damage, Dr. Dimas Aguas  . Spinal stenosis    causing LE weakness-persistant, being worked up by Hydrographic surveyor and neurologist    Past Surgical History:  Procedure Laterality Date  . ABDOMINAL AORTIC ANEURYSM REPAIR  2009   EVAR  . [place]  . IR RADIOLOGIST EVAL & MGMT  09/07/2017  . [place] 2004  . NEPHRECTOMY  2009   Open nephrectomy for cancer    Social History   Socioeconomic History  . Marital status: Widowed    Spouse name: Not on file  . Number of children: 3  . Years of education: Not on file  . Highest education level: Not on file  Social Needs  . Financial resource strain: Not on file  . Food insecurity - worry: Not on file  . Food insecurity - inability: Not on file  . Transportation needs - medical: Not on file  . Transportation needs - non-medical: Not on file  Occupational History  . Occupation: retired   Fish farm manager: RETIRED  Tobacco Use  . Smoking status: Former Smoker    Last attempt to quit: 12/28/2007    Years since quitting: 9.8  . Smokeless tobacco: Never Used  . Tobacco comment: used to smoke 1.5 ppd  Substance and Sexual Activity  . Alcohol use: No    Alcohol/week: 0.0 oz  . Drug use: No  . Sexual activity: Not on file  Other Topics Concern  . Not on file  Social History Narrative   Lives by himself    Lost wife to dementia 10-2015   Totally independent on ADL , no driving   Often came to office w/ his daughter [name] [phone]), his other daughter is  [name] , son is Derik                      Allergies as of 11/02/2017      Reactions   Baclofen Other (See Comments)   Unable to walk per Pt's family    Iodinated Diagnostic Agents    Only one kidney    Ioxaglate    Only one kidney       Medication List        Accurate as of 11/02/17  4:59 PM. Always use your  most recent med list.          carbidopa-levodopa 25-100 MG tablet Commonly known as:  SINEMET IR Take 1 tablet by mouth three times daily   Casanthranol-Docusate Sodium 30-100 MG Caps Take by mouth.   clobetasol 0.05 % external solution Commonly known as:  TEMOVATE APPLY AA BID   donepezil 5 MG tablet Commonly known as:  ARICEPT Take 5 mg by mouth at bedtime.   hydrALAZINE 25 MG tablet Commonly known as:  APRESOLINE Take 25 mg by mouth 3 (three) times daily.   hydrochlorothiazide 12.5 MG tablet Commonly known as:  HYDRODIURIL Take 12.5 mg daily by mouth.   labetalol 100 MG tablet Commonly known as:  NORMODYNE Take 1 tablet (100 mg total) by mouth 2 (two) times daily.   pregabalin 50 MG capsule Commonly known as:  LYRICA Take 50 mg by mouth at bedtime.   SE-TAN PLUS 162-115.2-1 MG Caps Take 1 tablet by mouth daily. Take 1 Tab  daily   sodium polystyrene 15 GM/60ML suspension Commonly known as:  KAYEXALATE Take 15 g by mouth on M, F.   Vitamin D 2000 units Caps Take 2,000 Units by  mouth daily.          Objective:   Physical Exam BP 122/70 (BP Location: Right Arm, Patient Position: Sitting, Cuff Size: Small)   Pulse 69   Temp 97.9 F (36.6 C) (Oral)   Resp 14   Ht 5\' 9"  (1.753 m)   Wt 160 lb 4 oz (72.7 kg)   SpO2 98%   BMI 23.66 kg/m  General:   Well developed, well nourished, elderly gentleman, chronically ill-appearing but in no distress, nontoxic HEENT:  Normocephalic . Face symmetric, atraumatic Lungs:  Decreased BS but clear Normal respiratory effort, no intercostal retractions, no accessory muscle use. Heart: RRR,  no murmur.  No pretibial edema bilaterally  Skin: Not pale. Not jaundice Neurologic:  alert & oriented X3.  Speech does not talk or communicate much but speech is with a low voice and slow.  Gait assisted by a cane, small steps, slow. Psych--  No anxious or depressed appearing.      Assessment & Plan:   Assessment  Hypertension, COPD--former smoker,PFTs 8- 2013 mild obstruction otherwise normal. Asx  Renal: (f/u @ BMI) --H/o Renal Cancer  --Solitary left kidney --Chronic renal insufficiency --hematuria: Dr Hazle Nordmann, cysto 01-2017 Neuro:   -on aricept per neuro, mild memory imparment -parkinson's   -neuropathy, saw neuro 06-2015, Dr Posey Pronto. As off 03-2016 is followed at [facility]:  +[name] gen neuro; +Dr Tonye Royalty now  Dr Hall Busing, movement d/o specialist   MSK: --DJD --Mild spinal stenosis, has seen  Dr Flavia Shipper Osteopenia--DEXA 07-2015, osteopenia, rx vit D Pancytopenia: sees hem-onc (Dr Cruzita Lederer, [facility]) , 2011 felt to be from doxycycline. Last OV 09-13-2017 AAA s/p EVAR 2009 Acne rosacea  Plan:  HTN: Currently on hydralazine, labetalol, hydrochlorothiazide.  BP today excellent. B pneumonia: See last visit, asx, lung exam normal, patient decided not to pursue a CT chest (per renal suggestion) Renal failure: Sees his nephrologist regularly, last visit October 2018. Parkinson's: Started Sinemet, has not helped much just  yet. Memory impairment: Stable per patient's daughter AAA: To have a endoleak repair by interventional radiology. Flu shot today RTC 6 months

## 2017-11-02 NOTE — Assessment & Plan Note (Signed)
HTN: Currently on hydralazine, labetalol, hydrochlorothiazide.  BP today excellent. B pneumonia: See last visit, asx, lung exam normal, patient decided not to pursue a CT chest (per renal suggestion) Renal failure: Sees his nephrologist regularly, last visit October 2018. Parkinson's: Started Sinemet, has not helped much just yet. Memory impairment: Stable per patient's daughter AAA: To have a endoleak repair by interventional radiology. Flu shot today RTC 6 months

## 2017-11-07 ENCOUNTER — Encounter: Payer: Self-pay | Admitting: Physical Therapy

## 2017-11-07 ENCOUNTER — Ambulatory Visit: Payer: Medicare Other | Admitting: Physical Therapy

## 2017-11-07 DIAGNOSIS — R29818 Other symptoms and signs involving the nervous system: Secondary | ICD-10-CM

## 2017-11-07 DIAGNOSIS — R29898 Other symptoms and signs involving the musculoskeletal system: Secondary | ICD-10-CM

## 2017-11-07 DIAGNOSIS — R293 Abnormal posture: Secondary | ICD-10-CM

## 2017-11-07 DIAGNOSIS — R2689 Other abnormalities of gait and mobility: Secondary | ICD-10-CM

## 2017-11-07 DIAGNOSIS — M6281 Muscle weakness (generalized): Secondary | ICD-10-CM | POA: Diagnosis not present

## 2017-11-07 DIAGNOSIS — R2681 Unsteadiness on feet: Secondary | ICD-10-CM | POA: Diagnosis not present

## 2017-11-07 DIAGNOSIS — R262 Difficulty in walking, not elsewhere classified: Secondary | ICD-10-CM | POA: Diagnosis not present

## 2017-11-07 NOTE — Therapy (Signed)
[facility] [address]  [place], [place], [address] Phone: [phone]   Fax:  [phone]  Physical Therapy Treatment  Patient Details  Name: [name] MRN: [id number] Date of Birth: [birth year] Referring Provider: Basil Dess, MD   Encounter Date: 11/07/2017  PT End of Session - 11/07/17 1532    Visit Number  5    Number of Visits  16    Date for PT Re-Evaluation  12/16/17    Authorization Type  [place] Medicare / BCBS    PT Start Time  1532    PT Stop Time  1615    PT Time Calculation (min)  43 min    Activity Tolerance  Patient tolerated treatment well    Behavior During Therapy  [facility] for tasks assessed/performed       Past Medical History:  Diagnosis Date  . Abdominal aortic aneurysm ([place]) 09/2008   s/o endovascular repair and angiogram 10/04/08   . Anemia    EGD Cscope 08-2012  . Bleeding ulcer 1997  . CKD (chronic kidney disease), stage IV (HCC)    Stable, Dr. Dimas Aguas  . COPD (chronic obstructive pulmonary disease) ([place])   . H/O hyperkalemia    Stable  . Hypertension   . Leukopenia 2/11   thought to be from doxy  . Renal cell carcinoma    clear cell type dx 10/09, s/p excision 10-24-08  . Renal insufficiency   . Solitary left kidney    W/ chronic tubule-interstitial damage, Dr. Dimas Aguas  . Spinal stenosis    causing LE weakness-persistant, being worked up by Hydrographic surveyor and neurologist    Past Surgical History:  Procedure Laterality Date  . ABDOMINAL AORTIC ANEURYSM REPAIR  2009   EVAR  . [place]  . IR RADIOLOGIST EVAL & MGMT  09/07/2017  . [place] ([place]), 2004  . NEPHRECTOMY  2009   Open nephrectomy for cancer    There were no vitals filed for this visit.  Subjective Assessment - 11/07/17 1539    Subjective  Pt dtr reporting today is pt's first day on full does of Sinemet - last time he reached this dose, they had to back off due to heaviness in  legs. Also had his initial accupunture treatment today - pt not noting any response to this.    Patient Stated Goals  "walking better"    Currently in Pain?  No/denies    Pain Score  0-No pain    Pain Onset  More than a month ago                      [facility] Adult PT Treatment/Exercise - 11/07/17 1532      Lumbar Exercises: Supine   Clam  15 reps;3 seconds    Clam Limitations  alt hip ABD/ER with green TB    Bent Knee Raise  15 reps;3 seconds    Bent Knee Raise Limitations  brace marching with green TB    Bridge  10 reps;3 seconds    Bridge Limitations  + hip ABD isomtric; limited lift      Knee/Hip Exercises: Aerobic   Nustep  lvl 4 x 6'        PWR [facility]) - 11/07/17 1532    PWR! exercises  Functional moves    PWR! Step Through Forward/Back  x20 each B isolating fwd & back; x20 combined fwd/back; 1 hand  support on counter;; carryover into fwd/back gait along edge of counter          PT Education - 11/07/17 1615    Education provided  Yes    Education Details  HEP addtion - supine lumbopelvic strengthening (green TB)    Person(s) Educated  Patient;Child(ren) dtr [name]    Methods  Explanation;Demonstration    Comprehension  Verbalized understanding;Returned demonstration;Need further instruction       PT Short Term Goals - 10/25/17 1541      PT SHORT TERM GOAL #1   Title  Independent with initial HEP as indicated    Status  On-going      PT SHORT TERM GOAL #2   Title  Pt will imporve gait speed to >/= 1.8 ft/sec to reduce fall risk    Status  On-going        PT Long Term Goals - 10/25/17 1541      PT LONG TERM GOAL #1   Title  Pt will be independent with advanced HEP as indicated    Status  On-going      PT LONG TERM GOAL #2   Title  B LE strength to >/= 4/5 for improved function     Status  On-going      PT LONG TERM GOAL #3   Title  Pt will demonstrate improvement on the Berg Balance Scale to 45/56 or greater to reduce risk for falls      Status  On-going      PT LONG TERM GOAL #4   Title  Pt will improve gait speed to 2.6 ft/sec or greater for improved safety with community ambulation     Status  On-going      PT LONG TERM GOAL #5   Title  Pt will complete TUG in </= 15 sec to improve safety with transitions    Status  On-going            Plan - 11/07/17 1542    Clinical Impression Statement  Pt & dtr unable to tell if change in TENS settings created a more lasting benefit after last session and pt's dtr reports limited use of home unit over last week as she was traveling and not available to help him with the set-up. Lumbar garment should be available as of next visit to aide in placement of electrodes to allow for increased ease of set-up of TENS unti for home use. Pt's dtr reporting pt reached full does of Sinemet for the first time today since having to cut it back due to sensation of increased "heaviness" in his legs on last attempt at current dose. As such, focused significant portion of treatment session on promoting increased hip and knee flexion and increased stride length utilizing PWR! step patterns, with pt able to demonstrate limited carryover t/o session. Supine lumbopelvic strengthening exercises reviewed and added to HEP with green TB provided for home use.    Rehab Potential  Good    Clinical Impairments Affecting Rehab Potential  Advanced age, Parkinsonism, OA of mutiple joints, lumbar stenosis, LE neuropathy, HTN, COPD, CKD    PT Treatment/Interventions  Patient/family education;Neuromuscular re-education;Balance training;Therapeutic exercise;Therapeutic activities;Functional mobility training;Gait training;Manual techniques;Taping;Dry needling;Electrical Stimulation;Moist Heat;Cryotherapy;ADLs/Self Care Home Management    Consulted and Agree with Plan of Care  Patient;Family member/caregiver    Family Member Consulted  Dtr - [name]       Patient will benefit from skilled therapeutic intervention in  order to improve the following deficits  and impairments:  Abnormal gait, Difficulty walking, Decreased activity tolerance, Decreased balance, Decreased coordination, Decreased safety awareness, Decreased strength, Impaired flexibility, Decreased range of motion, Postural dysfunction, Improper body mechanics, Pain, Impaired sensation  Visit Diagnosis: Other abnormalities of gait and mobility  Difficulty in walking, not elsewhere classified  Unsteadiness on feet  Muscle weakness (generalized)  Other symptoms and signs involving the musculoskeletal system  Other symptoms and signs involving the nervous system  Abnormal posture     Problem List Patient Active Problem List   Diagnosis Date Noted  . Parkinsonism ([place]) 07/10/2017  . Status post dilatation of esophageal stricture 05/15/2017  . Pancytopenia ([place]) 03/31/2016  . Follow-up --- PCP NOTES 09/02/2015  . Neuropathy 06/10/2015  . Pedal edema 01/30/2015  . Hip pain, chronic 01/30/2015  . Back pain 01/30/2015  . Cramps of lower extremity 01/30/2015  . Aftercare following surgery of the circulatory system, [place] 08/05/2014  . Annual physical exam 10/19/2011  . DJD (degenerative joint disease) 12/14/2010  . Anemia 06/06/2009  . COPD (chronic obstructive pulmonary disease) ([place]) 06/06/2009  . RENAL CELL CANCER 12/25/2008  . HTN (hypertension) 12/25/2008  . AAA (abdominal aortic aneurysm) without rupture ([place]) 12/25/2008  . CKD (chronic kidney disease) 12/25/2008  . HYPERCHOLESTEROLEMIA 03/01/2008  . GERD 03/01/2008  . ACNE ROSACEA 11/29/2007  . DIVERTICULOSIS, COLON 04/12/2007    Percival Spanish, PT, MPT 11/07/2017, 6:58 PM  [facility] [address]  [place], [place], [address] Phone: [phone]   Fax:  [phone]  Name: CROSLEY STEJSKAL MRN: [id number] Date of Birth: 09-16-1927

## 2017-11-10 ENCOUNTER — Ambulatory Visit: Payer: Medicare Other | Admitting: Physical Therapy

## 2017-11-10 ENCOUNTER — Encounter: Payer: Self-pay | Admitting: Physical Therapy

## 2017-11-10 DIAGNOSIS — R293 Abnormal posture: Secondary | ICD-10-CM

## 2017-11-10 DIAGNOSIS — R2681 Unsteadiness on feet: Secondary | ICD-10-CM

## 2017-11-10 DIAGNOSIS — R262 Difficulty in walking, not elsewhere classified: Secondary | ICD-10-CM | POA: Diagnosis not present

## 2017-11-10 DIAGNOSIS — M6281 Muscle weakness (generalized): Secondary | ICD-10-CM

## 2017-11-10 DIAGNOSIS — R2689 Other abnormalities of gait and mobility: Secondary | ICD-10-CM | POA: Diagnosis not present

## 2017-11-10 DIAGNOSIS — R29898 Other symptoms and signs involving the musculoskeletal system: Secondary | ICD-10-CM

## 2017-11-10 DIAGNOSIS — R29818 Other symptoms and signs involving the nervous system: Secondary | ICD-10-CM

## 2017-11-10 NOTE — Therapy (Signed)
[facility] [address]  [place], [place], [address] Phone: [phone]   Fax:  [phone]  Physical Therapy Treatment  Patient Details  Name: [name] MRN: [id number] Date of Birth: [birth year] Referring Provider: Basil Dess, MD   Encounter Date: 11/10/2017  PT End of Session - 11/10/17 1529    Visit Number  6    Number of Visits  16    Date for PT Re-Evaluation  12/16/17    Authorization Type  [place] Medicare / BCBS    PT Start Time  1529    PT Stop Time  7893    PT Time Calculation (min)  52 min    Activity Tolerance  Patient tolerated treatment well    Behavior During Therapy  [facility] for tasks assessed/performed       Past Medical History:  Diagnosis Date  . Abdominal aortic aneurysm ([place]) 09/2008   s/o endovascular repair and angiogram 10/04/08   . Anemia    EGD Cscope 08-2012  . Bleeding ulcer 1997  . CKD (chronic kidney disease), stage IV (HCC)    Stable, Dr. Dimas Aguas  . COPD (chronic obstructive pulmonary disease) ([place])   . H/O hyperkalemia    Stable  . Hypertension   . Leukopenia 2/11   thought to be from doxy  . Renal cell carcinoma    clear cell type dx 10/09, s/p excision 10-24-08  . Renal insufficiency   . Solitary left kidney    W/ chronic tubule-interstitial damage, Dr. Dimas Aguas  . Spinal stenosis    causing LE weakness-persistant, being worked up by Hydrographic surveyor and neurologist    Past Surgical History:  Procedure Laterality Date  . ABDOMINAL AORTIC ANEURYSM REPAIR  2009   EVAR  . [place]  . IR RADIOLOGIST EVAL & MGMT  09/07/2017  . [place] 2004  . NEPHRECTOMY  2009   Open nephrectomy for cancer    There were no vitals filed for this visit.  Subjective Assessment - 11/10/17 1534    Subjective  Pt reports legs are feeling heavier today and the "bubbling" is present under his toes.    Patient Stated Goals  "walking better"    Currently in Pain?  Yes    Pain Score  9     Pain Location  Foot    Pain Orientation  Right;Left    Pain Descriptors / Indicators  -- "bubbling"    Pain Type  Acute pain;Chronic pain;Neuropathic pain    Pain Onset  More than a month ago    Pain Frequency  Intermittent                      [facility] Adult PT Treatment/Exercise - 11/10/17 1529      Ambulation/Gait   Ambulation/Gait Assistance  5: Supervision;4: Min guard    Ambulation/Gait Assistance Details  Promoted carryover of PWR! step into gait, encouraging increased foot clearance with increased hip & knee flexion and longer stride length - pt able to carryover initially after PWR! step practice but carryover lessens as visit progresses.    Ambulation Distance (Feet)  100 Feet 2 x 50 ft    Assistive device  None    Gait Pattern  Trunk flexed;Shuffle;Festinating;Poor foot clearance - left;Poor foot clearance - right;Decreased step length - right;Decreased step length - left;Decreased stride length;Decreased hip/knee flexion - left;Decreased hip/knee flexion - right;Decreased dorsiflexion - left;Decreased  dorsiflexion - right    Ambulation Surface  Level;Indoor      Lumbar Exercises: Standing   Other Standing Lumbar Exercises  Trunk extension into pool noodle on wall + red TB horiz ABD/scap squeeze & alt shoulder flex/ext 10x3" each      Lumbar Exercises: Seated   Other Seated Lumbar Exercises  B sciactic nerve glides 2x10 (knee extension + ankle pump, slumped with knee flexed to tall sitting with LAQ - 2nd version better tolerated)      Knee/Hip Exercises: Aerobic   Nustep  lvl 4 x 6'        [facility]) - 11/10/17 1529    PWR! exercises  Functional moves    PWR! Step Through Forward/Back  x20 each B isolating fwd & back; x20 combined fwd/back; 1 hand support on counter;; carryover into fwd/back gait along edge of counter, progressing to unsupported gait          PT Education - 11/10/17 1626    Education  provided  Yes    Education Details  PWR! Step to promote increased step/stride length    Person(s) Educated  Patient;Child(ren) dtr [name]    Methods  Explanation;Demonstration;Handout    Comprehension  Verbalized understanding;Returned demonstration;Need further instruction       PT Short Term Goals - 10/25/17 1541      PT SHORT TERM GOAL #1   Title  Independent with initial HEP as indicated    Status  On-going      PT SHORT TERM GOAL #2   Title  Pt will imporve gait speed to >/= 1.8 ft/sec to reduce fall risk    Status  On-going        PT Long Term Goals - 10/25/17 1541      PT LONG TERM GOAL #1   Title  Pt will be independent with advanced HEP as indicated    Status  On-going      PT LONG TERM GOAL #2   Title  B LE strength to >/= 4/5 for improved function     Status  On-going      PT LONG TERM GOAL #3   Title  Pt will demonstrate improvement on the Berg Balance Scale to 45/56 or greater to reduce risk for falls     Status  On-going      PT LONG TERM GOAL #4   Title  Pt will improve gait speed to 2.6 ft/sec or greater for improved safety with community ambulation     Status  On-going      PT LONG TERM GOAL #5   Title  Pt will complete TUG in </= 15 sec to improve safety with transitions    Status  On-going            Plan - 11/10/17 1538    Clinical Impression Statement  Pt & dtr reporting worsening of LE heaviness and "bubbling" in feet today (3rd day on full dose of Sinemet) with dtr also noting some decline in memory/cognitive function (pt forgetting to take pills and slower to get going in the morning). "Bubbling" improved after warm-up on NuStep, but returning intermittently during exercises and activities, therefore treatment concluded with estim and moist heat (pt having difficulty using home TENS/MT unit with standard electrodes and still awaiting garment with velcro electrodes for ease of home use). Remaining treatment focus on postural correction and  promoting normalized gait pattern.    Rehab Potential  Good    Clinical Impairments  Affecting Rehab Potential  Advanced age, Parkinsonism, OA of mutiple joints, lumbar stenosis, LE neuropathy, HTN, COPD, CKD    PT Treatment/Interventions  Patient/family education;Neuromuscular re-education;Balance training;Therapeutic exercise;Therapeutic activities;Functional mobility training;Gait training;Manual techniques;Taping;Dry needling;Electrical Stimulation;Moist Heat;Cryotherapy;ADLs/Self Care Home Management    Consulted and Agree with Plan of Care  Patient;Family member/caregiver    Family Member Consulted  Dtr - [name]       Patient will benefit from skilled therapeutic intervention in order to improve the following deficits and impairments:  Abnormal gait, Difficulty walking, Decreased activity tolerance, Decreased balance, Decreased coordination, Decreased safety awareness, Decreased strength, Impaired flexibility, Decreased range of motion, Postural dysfunction, Improper body mechanics, Pain, Impaired sensation  Visit Diagnosis: Other abnormalities of gait and mobility  Difficulty in walking, not elsewhere classified  Unsteadiness on feet  Muscle weakness (generalized)  Other symptoms and signs involving the musculoskeletal system  Other symptoms and signs involving the nervous system  Abnormal posture     Problem List Patient Active Problem List   Diagnosis Date Noted  . Parkinsonism ([place]) 07/10/2017  . Status post dilatation of esophageal stricture 05/15/2017  . Pancytopenia ([place]) 03/31/2016  . Follow-up --- PCP NOTES 09/02/2015  . Neuropathy 06/10/2015  . Pedal edema 01/30/2015  . Hip pain, chronic 01/30/2015  . Back pain 01/30/2015  . Cramps of lower extremity 01/30/2015  . Aftercare following surgery of the circulatory system, [place] 08/05/2014  . Annual physical exam 10/19/2011  . DJD (degenerative joint disease) 12/14/2010  . Anemia 06/06/2009  . COPD (chronic  obstructive pulmonary disease) ([place]) 06/06/2009  . RENAL CELL CANCER 12/25/2008  . HTN (hypertension) 12/25/2008  . AAA (abdominal aortic aneurysm) without rupture ([place]) 12/25/2008  . CKD (chronic kidney disease) 12/25/2008  . HYPERCHOLESTEROLEMIA 03/01/2008  . GERD 03/01/2008  . ACNE ROSACEA 11/29/2007  . DIVERTICULOSIS, COLON 04/12/2007    Percival Spanish, PT, MPT 11/10/2017, 4:45 PM  [facility] [address]  [place], [place], [address] Phone: [phone]   Fax:  [phone]  Name: [name] MRN: [id number] Date of Birth: Jan 04, 1927

## 2017-11-15 ENCOUNTER — Encounter: Payer: Self-pay | Admitting: Physical Therapy

## 2017-11-15 ENCOUNTER — Encounter (HOSPITAL_COMMUNITY): Payer: Self-pay | Admitting: Emergency Medicine

## 2017-11-15 ENCOUNTER — Ambulatory Visit: Payer: Medicare Other | Admitting: Physical Therapy

## 2017-11-15 DIAGNOSIS — R29818 Other symptoms and signs involving the nervous system: Secondary | ICD-10-CM | POA: Diagnosis not present

## 2017-11-15 DIAGNOSIS — R2689 Other abnormalities of gait and mobility: Secondary | ICD-10-CM | POA: Diagnosis not present

## 2017-11-15 DIAGNOSIS — R262 Difficulty in walking, not elsewhere classified: Secondary | ICD-10-CM | POA: Diagnosis not present

## 2017-11-15 DIAGNOSIS — R2681 Unsteadiness on feet: Secondary | ICD-10-CM | POA: Diagnosis not present

## 2017-11-15 DIAGNOSIS — M6281 Muscle weakness (generalized): Secondary | ICD-10-CM | POA: Diagnosis not present

## 2017-11-15 DIAGNOSIS — R29898 Other symptoms and signs involving the musculoskeletal system: Secondary | ICD-10-CM | POA: Diagnosis not present

## 2017-11-15 DIAGNOSIS — R293 Abnormal posture: Secondary | ICD-10-CM

## 2017-11-15 NOTE — Therapy (Signed)
[facility] [address]  [place], [place], [address] Phone: [phone]   Fax:  [phone]  Physical Therapy Treatment  Patient Details  Name: [name] MRN: [id number] Date of Birth: 11-Sep-1927 Referring Provider: Basil Dess, MD   Encounter Date: 11/15/2017  PT End of Session - 11/15/17 1530    Visit Number  7    Number of Visits  16    Date for PT Re-Evaluation  12/16/17    Authorization Type  [place] Medicare / BCBS    PT Start Time  9937    PT Stop Time  1696    PT Time Calculation (min)  51 min    Activity Tolerance  Patient tolerated treatment well    Behavior During Therapy  [facility] for tasks assessed/performed       Past Medical History:  Diagnosis Date  . Abdominal aortic aneurysm ([place]) 09/2008   s/o endovascular repair and angiogram 10/04/08   . Anemia    EGD Cscope 08-2012  . Bleeding ulcer 1997  . CKD (chronic kidney disease), stage IV (HCC)    Stable, Dr. Dimas Aguas  . COPD (chronic obstructive pulmonary disease) ([place])   . H/O hyperkalemia    Stable  . Hypertension   . Leukopenia 2/11   thought to be from doxy  . Renal cell carcinoma    clear cell type dx 10/09, s/p excision 10-24-08  . Renal insufficiency   . Solitary left kidney    W/ chronic tubule-interstitial damage, Dr. Dimas Aguas  . Spinal stenosis    causing LE weakness-persistant, being worked up by Hydrographic surveyor and neurologist    Past Surgical History:  Procedure Laterality Date  . ABDOMINAL AORTIC ANEURYSM REPAIR  2009   EVAR  . [place]  . IR RADIOLOGIST EVAL & MGMT  09/07/2017  . [place] 2004  . NEPHRECTOMY  2009   Open nephrectomy for cancer    There were no vitals filed for this visit.  Subjective Assessment - 11/15/17 1533    Subjective  Pt reports legs are feeling heavier today and the "bubbling" is present under his toes.    Patient Stated Goals  "walking better"    Currently in Pain?  Yes    Pain Score  5  typically upon getting up to walk    Pain Location  Foot    Pain Orientation  Right;Left    Pain Descriptors / Indicators  -- "bubbling"    Pain Type  Acute pain;Chronic pain;Neuropathic pain    Pain Frequency  Intermittent    Multiple Pain Sites  Yes    Pain Score  6    Pain Location  Hip    Pain Orientation  Right;Lateral    Pain Descriptors / Indicators  Aching    Pain Type  Acute pain;Chronic pain    Pain Frequency  Intermittent                      [facility] Adult PT Treatment/Exercise - 11/15/17 1530      Self-Care   Self-Care  Other Self-Care Comments    Other Self-Care Comments   Pt & dtr instructed in set-up and use of TENS/MT lumbar electrode garment - emphasizing need to wet the electrodes before placing garment over bare skin, lining center of garment along spine so that elecrodes are aligned with lumbar paraspinals. Reviewed use of home TENS unit  for setting adjustment as well.      Knee/Hip Exercises: Aerobic   Nustep  lvl 4 x 6'      Modalities   Modalities  Electrical Stimulation;Moist Heat      Moist Heat Therapy   Number Minutes Moist Heat  8 Minutes    Moist Heat Location  Lumbar Spine      Electrical Stimulation   Electrical Stimulation Location  lumbar spine    Electrical Stimulation Action  TENS    Electrical Stimulation Parameters  MRW - 150Hz , intensity to pt tol x 8' (stopped early due to pt need to use restroom)    Electrical Stimulation Goals  Pain radicular symptoms        [facility]) - 11/15/17 1530    PWR! exercises  Moves in sitting;Functional moves    PWR! Sit to Stand  x10    PWR! Up  x10    PWR! Rock  x10    PWR! Twist  x10    PWR! Step  x10    Comments  pt with difficulty coordinating movements          PT Education - 11/15/17 1615    Education provided  Yes    Education Details  Training is set-up & use of lumbar elecrode garment for home TENS/MT unit    Person(s) Educated   Patient;Child(ren) dtr [name]    Methods  Explanation;Demonstration    Comprehension  Verbalized understanding;Returned demonstration       PT Short Term Goals - 11/15/17 1834      PT SHORT TERM GOAL #1   Title  Independent with initial HEP as indicated    Status  Achieved      PT SHORT TERM GOAL #2   Title  Pt will improve gait speed to >/= 1.8 ft/sec to reduce fall risk    Status  On-going        PT Long Term Goals - 10/25/17 1541      PT LONG TERM GOAL #1   Title  Pt will be independent with advanced HEP as indicated    Status  On-going      PT LONG TERM GOAL #2   Title  B LE strength to >/= 4/5 for improved function     Status  On-going      PT LONG TERM GOAL #3   Title  Pt will demonstrate improvement on the Berg Balance Scale to 45/56 or greater to reduce risk for falls     Status  On-going      PT LONG TERM GOAL #4   Title  Pt will improve gait speed to 2.6 ft/sec or greater for improved safety with community ambulation     Status  On-going      PT LONG TERM GOAL #5   Title  Pt will complete TUG in </= 15 sec to improve safety with transitions    Status  On-going            Plan - 11/15/17 1618    Clinical Impression Statement  Re-introduced PWR! Moves in sitting with pt having more difficulty coordinating movement patterns than during prior PT episodes. Pt was able to carryover PWR! up into sit to stand transition with momentum decreasing need for UE assist during transfer. Treatment concluded with training for pt and dtr in set-up and use of lumbar garment for TENS electrodes, with pt's dtr feeling like this will significantly increase ease of home use of TENS  unit.    Rehab Potential  Good    Clinical Impairments Affecting Rehab Potential  Advanced age, Parkinsonism, OA of mutiple joints, lumbar stenosis, LE neuropathy, HTN, COPD, CKD    PT Treatment/Interventions  Patient/family education;Neuromuscular re-education;Balance training;Therapeutic  exercise;Therapeutic activities;Functional mobility training;Gait training;Manual techniques;Taping;Dry needling;Electrical Stimulation;Moist Heat;Cryotherapy;ADLs/Self Care Home Management    Consulted and Agree with Plan of Care  Patient;Family member/caregiver    Family Member Consulted  Dtr - [name]       Patient will benefit from skilled therapeutic intervention in order to improve the following deficits and impairments:  Abnormal gait, Difficulty walking, Decreased activity tolerance, Decreased balance, Decreased coordination, Decreased safety awareness, Decreased strength, Impaired flexibility, Decreased range of motion, Postural dysfunction, Improper body mechanics, Pain, Impaired sensation  Visit Diagnosis: Other abnormalities of gait and mobility  Difficulty in walking, not elsewhere classified  Unsteadiness on feet  Muscle weakness (generalized)  Other symptoms and signs involving the musculoskeletal system  Other symptoms and signs involving the nervous system  Abnormal posture     Problem List Patient Active Problem List   Diagnosis Date Noted  . Parkinsonism ([place]) 07/10/2017  . Status post dilatation of esophageal stricture 05/15/2017  . Pancytopenia ([place]) 03/31/2016  . Follow-up --- PCP NOTES 09/02/2015  . Neuropathy 06/10/2015  . Pedal edema 01/30/2015  . Hip pain, chronic 01/30/2015  . Back pain 01/30/2015  . Cramps of lower extremity 01/30/2015  . Aftercare following surgery of the circulatory system, [place] 08/05/2014  . Annual physical exam 10/19/2011  . DJD (degenerative joint disease) 12/14/2010  . Anemia 06/06/2009  . COPD (chronic obstructive pulmonary disease) ([place]) 06/06/2009  . RENAL CELL CANCER 12/25/2008  . HTN (hypertension) 12/25/2008  . AAA (abdominal aortic aneurysm) without rupture ([place]) 12/25/2008  . CKD (chronic kidney disease) 12/25/2008  . HYPERCHOLESTEROLEMIA 03/01/2008  . GERD 03/01/2008  . ACNE ROSACEA 11/29/2007  .  DIVERTICULOSIS, COLON 04/12/2007    Percival Spanish, PT, MPT 11/15/2017, 6:41 PM  [facility] [address]  [place], [place], [address] Phone: [phone]   Fax:  [phone]  Name: JAIEL SARACENO MRN: [id number] Date of Birth: November 01, 1927

## 2017-11-15 NOTE — Progress Notes (Signed)
TC from patients daughter Secundino Ginger [phone]. Pre-admission testing appointment scheduled for 11/18/2017 at 0800. Daughter is concerned that she has not received instructions regarding arrival time for  Pre-hydration on 11/23/2017 and if he will be able to keep his appointment with Dr Louanne Skye on the 28th. I advised that I would reach out to the IR team for arrival instructions for pre-hydration. Message sent to [facility] and Dr Laurence Ferrari via [facility] requesting orders and admission instructions for the patient.

## 2017-11-18 ENCOUNTER — Other Ambulatory Visit (HOSPITAL_COMMUNITY): Payer: Medicare Other

## 2017-11-21 ENCOUNTER — Encounter: Payer: Self-pay | Admitting: Physical Therapy

## 2017-11-21 ENCOUNTER — Telehealth: Payer: Self-pay | Admitting: Student

## 2017-11-21 ENCOUNTER — Ambulatory Visit: Payer: Medicare Other | Admitting: Physical Therapy

## 2017-11-21 DIAGNOSIS — M6281 Muscle weakness (generalized): Secondary | ICD-10-CM

## 2017-11-21 DIAGNOSIS — R29898 Other symptoms and signs involving the musculoskeletal system: Secondary | ICD-10-CM

## 2017-11-21 DIAGNOSIS — R293 Abnormal posture: Secondary | ICD-10-CM

## 2017-11-21 DIAGNOSIS — R2681 Unsteadiness on feet: Secondary | ICD-10-CM

## 2017-11-21 DIAGNOSIS — R29818 Other symptoms and signs involving the nervous system: Secondary | ICD-10-CM

## 2017-11-21 DIAGNOSIS — R262 Difficulty in walking, not elsewhere classified: Secondary | ICD-10-CM

## 2017-11-21 DIAGNOSIS — R2689 Other abnormalities of gait and mobility: Secondary | ICD-10-CM

## 2017-11-21 NOTE — Therapy (Addendum)
[facility] [address]  [place], [place], [address] Phone: [phone]   Fax:  [phone]  Physical Therapy Treatment  Patient Details  Name: [name] MRN: [id number] Date of Birth: October 26, 1927 Referring Provider: Basil Dess, MD   Encounter Date: 11/21/2017  PT End of Session - 11/21/17 1532    Visit Number  8    Number of Visits  16    Date for PT Re-Evaluation  12/16/17    Authorization Type  [place] [employer] / BCBS    PT Start Time  1532    PT Stop Time  1614    PT Time Calculation (min)  42 min    Activity Tolerance  Patient tolerated treatment well    Behavior During Therapy  [facility] for tasks assessed/performed       Past Medical History:  Diagnosis Date  . Abdominal aortic aneurysm ([place]) 09/2008   s/o endovascular repair and angiogram 10/04/08   . Anemia    EGD Cscope 08-2012  . Bleeding ulcer 1997  . CKD (chronic kidney disease), stage IV (HCC)    Stable, Dr. Dimas Aguas  . COPD (chronic obstructive pulmonary disease) ([place])   . H/O hyperkalemia    Stable  . Hypertension   . Leukopenia 2/11   thought to be from doxy  . Renal cell carcinoma    clear cell type dx 10/09, s/p excision 10-24-08  . Renal insufficiency   . Solitary left kidney    W/ chronic tubule-interstitial damage, Dr. Dimas Aguas  . Spinal stenosis    causing LE weakness-persistant, being worked up by Hydrographic surveyor and neurologist    Past Surgical History:  Procedure Laterality Date  . ABDOMINAL AORTIC ANEURYSM REPAIR  2009   EVAR  . [place]  . IR RADIOLOGIST EVAL & MGMT  09/07/2017  . [place] ([place]), 2004  . NEPHRECTOMY  2009   Open nephrectomy for cancer    There were no vitals filed for this visit.  Subjective Assessment - 11/21/17 1532    Subjective  Pt reporting the lumbar garment helped with use of the home TENS/MT unit.    Patient Stated Goals  "walking better"    Currently in Pain?   No/denies    Pain Score  0-No pain         [facility] PT Assessment - 11/21/17 1532      Assessment   Medical Diagnosis  Lumbar spinal stenosis, balance & coordination deficits, Parkinsonism    Referring Provider  Basil Dess, MD    Onset Date/Surgical Date  10/05/17 most recent exacerbation    Next MD Visit  11/23/17      Strength   Overall Strength Comments  tested in sitting    Right Hip Flexion  4-/5    Right Hip Extension  3+/5    Right Hip External Rotation   4-/5    Right Hip Internal Rotation  4-/5    Right Hip ABduction  4/5    Right Hip ADduction  4-/5    Left Hip Flexion  4-/5    Left Hip Extension  3+/5    Left Hip External Rotation  4-/5    Left Hip Internal Rotation  4-/5    Left Hip ABduction  4/5    Left Hip ADduction  4-/5    Right Knee Flexion  4+/5    Right Knee Extension  4+/5    Left  Knee Flexion  4/5    Left Knee Extension  4+/5    Right Ankle Dorsiflexion  4/5    Left Ankle Dorsiflexion  4/5      Ambulation/Gait   Gait Pattern  Trunk flexed;Shuffle;Festinating;Poor foot clearance - left;Poor foot clearance - right;Decreased step length - right;Decreased step length - left;Decreased stride length;Decreased hip/knee flexion - left;Decreased hip/knee flexion - right;Decreased dorsiflexion - left;Decreased dorsiflexion - right    Ambulation Surface  Level;Indoor    Gait velocity  2.05 ft/sec w/o AD      Standardized Balance Assessment   Standardized Balance Assessment  Berg Balance Test;10 meter walk test;Timed Up and Go Test    10 Meter Walk  15.97      Berg Balance Test   Sit to Stand  Able to stand  independently using hands    Standing Unsupported  Able to stand safely 2 minutes    Sitting with Back Unsupported but Feet Supported on Floor or Stool  Able to sit safely and securely 2 minutes    Stand to Sit  Sits safely with minimal use of hands    Transfers  Able to transfer safely, minor use of hands    Standing Unsupported with Eyes Closed  Able  to stand 10 seconds with supervision    Standing Ubsupported with Feet Together  Able to place feet together independently and stand for 1 minute with supervision    From Standing, Reach Forward with Outstretched Arm  Can reach forward >12 cm safely (5")    From Standing Position, Pick up Object from [place] to pick up shoe, needs supervision    From Standing Position, Turn to Look Behind Over each Shoulder  Turn sideways only but maintains balance    Turn 360 Degrees  Able to turn 360 degrees safely but slowly    Standing Unsupported, Alternately Place Feet on Step/Stool  Able to complete 4 steps without aid or supervision    Standing Unsupported, One Foot in [place] to take small step independently and hold 30 seconds    Standing on One Leg  Unable to try or needs assist to prevent fall    Total Score  39      Timed Up and Go Test   Normal TUG (seconds)  17.56                  [facility] Adult PT Treatment/Exercise - 11/21/17 1532      Exercises   Exercises  Knee/Hip      Knee/Hip Exercises: Standing   Hip Extension  Both;10 reps;Knee straight;Stengthening    Extension Limitations  looped yellow TB    Other Standing Knee Exercises  B side-stepping along counter with looped yellow TB at ankles 2 x 10 ft        [facility]) - 11/21/17 1532    PWR! exercises  Functional moves    PWR! Step Through Forward/Back  fwd/back x10 each side             PT Short Term Goals - 11/21/17 1541      PT SHORT TERM GOAL #1   Title  Independent with initial HEP as indicated    Status  Achieved      PT SHORT TERM GOAL #2   Title  Pt will improve gait speed to >/= 1.8 ft/sec to reduce fall risk    Status  Achieved        PT Long Term Goals -  11/21/17 1542      PT LONG TERM GOAL #1   Title  Pt will be independent with advanced HEP as indicated    Status  On-going      PT LONG TERM GOAL #2   Title  B LE strength to >/= 4/5 for improved function     Status  Partially  Met      PT LONG TERM GOAL #3   Title  Pt will demonstrate improvement on the Berg Balance Scale to 45/56 or greater to reduce risk for falls     Status  On-going      PT LONG TERM GOAL #4   Title  Pt will improve gait speed to 2.6 ft/sec or greater for improved safety with community ambulation     Status  On-going      PT LONG TERM GOAL #5   Title  Pt will complete TUG in </= 15 sec to improve safety with transitions    Status  On-going            Plan - 11/21/17 1614    Clinical Impression Statement  [place] has demonstrated good progress with PT this episode. B LE strength improved by 1/2 grade on average. Balance improved significantly per standardized testing with TUG decreased from 31.12 sec to 17.56 sec and Berg increased from 29/56 to 39/56. Gait speed increased to 2.05 ft/sec from 1.49 ft/sec with pt able to demonstrate improving stride length and foot clearance during therapy sessions, although pt's family noting inconsistent carryover outside of therapy sessions. Pt demonstrating progress toward all goals at this time and will benefit from continued PT to further improve core and LE strength, balance and gait stability. Pt scheduled for surgery later this week and will plan to resume PT as of next week provided no issues with surgery or restrictions from MD.    Rehab Potential  Good    Clinical Impairments Affecting Rehab Potential  Advanced age, Parkinsonism, OA of mutiple joints, lumbar stenosis, LE neuropathy, HTN, COPD, CKD    PT Treatment/Interventions  Patient/family education;Neuromuscular re-education;Balance training;Therapeutic exercise;Therapeutic activities;Functional mobility training;Gait training;Manual techniques;Taping;Dry needling;Electrical Stimulation;Moist Heat;Cryotherapy;ADLs/Self Care Home Management    Consulted and Agree with Plan of Care  Patient;Family member/caregiver    Family Member Consulted  Dtr - [name]       Patient will benefit from skilled  therapeutic intervention in order to improve the following deficits and impairments:  Abnormal gait, Difficulty walking, Decreased activity tolerance, Decreased balance, Decreased coordination, Decreased safety awareness, Decreased strength, Impaired flexibility, Decreased range of motion, Postural dysfunction, Improper body mechanics, Pain, Impaired sensation  Visit Diagnosis: Other abnormalities of gait and mobility  Difficulty in walking, not elsewhere classified  Unsteadiness on feet  Muscle weakness (generalized)  Other symptoms and signs involving the musculoskeletal system  Other symptoms and signs involving the nervous system  Abnormal posture     Problem List Patient Active Problem List   Diagnosis Date Noted  . Parkinsonism ([place]) 07/10/2017  . Status post dilatation of esophageal stricture 05/15/2017  . Pancytopenia ([place]) 03/31/2016  . Follow-up --- PCP NOTES 09/02/2015  . Neuropathy 06/10/2015  . Pedal edema 01/30/2015  . Hip pain, chronic 01/30/2015  . Back pain 01/30/2015  . Cramps of lower extremity 01/30/2015  . Aftercare following surgery of the circulatory system, [place] 08/05/2014  . Annual physical exam 10/19/2011  . DJD (degenerative joint disease) 12/14/2010  . Anemia 06/06/2009  . COPD (chronic obstructive pulmonary disease) ([place]) 06/06/2009  .  RENAL CELL CANCER 12/25/2008  . HTN (hypertension) 12/25/2008  . AAA (abdominal aortic aneurysm) without rupture ([place]) 12/25/2008  . CKD (chronic kidney disease) 12/25/2008  . HYPERCHOLESTEROLEMIA 03/01/2008  . GERD 03/01/2008  . ACNE ROSACEA 11/29/2007  . DIVERTICULOSIS, COLON 04/12/2007    Percival Spanish, PT,MPT 11/21/2017, 8:31 PM  [facility] [address]  [place], [place], [address] Phone: [phone]   Fax:  [phone]  Name: [name] MRN: [id number] Date of Birth: 01/08/1927   PHYSICAL THERAPY DISCHARGE SUMMARY  Visits  from Start of Care: 8  Current functional level related to goals / functional outcomes:   Pt admitted to hospital on 11/23/17 for repair of AAA endoleak but ended up with extended acute care stay followed by inpatient rehab with plan for D/C home today (12/07/17). Refer to above clinical impression for progress with PT prior to hospitalization. Pt to return to OP PT upon discharge from hospital and will plan to start new episode at that time as significant change in status anticipated.   Remaining deficits:   Refer to above clinical impression for status as of last treatment visit.   Education / Equipment:   HEP; home TENS/MT unit for pain control   G-Codes - 10-30-2017 1705    Functional Assessment Tool Used (Outpatient Only) Berg & TUG + clinical judgement   Functional Limitation Mobility: Walking and moving around   Mobility: Walking and Moving Around Goal Status [phone]) At least 40 percent but less than 60 percent impaired, limited or restricted   Mobility: Walking and Moving Around Discharge Status [phone]) At least 40 percent but less than 60 percent impaired, limited or restricted    Plan: Patient agrees to discharge.  Patient goals were partially met. Patient is being discharged due to a change in medical status.  ?????    Percival Spanish, PT, MPT 12/07/17, 12:05 PM  [facility] [address]  [place], [place], [address] Phone: [phone]   Fax:  [phone]

## 2017-11-21 NOTE — Telephone Encounter (Signed)
Left VM on daughter's phone to call radiology department for update re: plans for procedure this week.   Per patient placement team, bed request should be placed Wednesday.  Once assigned a bed, [name] will be called from home for admission.   Procedure is planned for Thursday November 29th.   Brynda Greathouse, MS RD PA-C 1:50 PM

## 2017-11-23 ENCOUNTER — Ambulatory Visit (INDEPENDENT_AMBULATORY_CARE_PROVIDER_SITE_OTHER): Payer: Medicare Other | Admitting: Specialist

## 2017-11-23 ENCOUNTER — Encounter (HOSPITAL_COMMUNITY): Payer: Self-pay | Admitting: General Practice

## 2017-11-23 ENCOUNTER — Other Ambulatory Visit: Payer: Self-pay

## 2017-11-23 ENCOUNTER — Inpatient Hospital Stay (HOSPITAL_COMMUNITY)
Admission: RE | Admit: 2017-11-23 | Discharge: 2017-11-29 | DRG: 299 | Disposition: A | Discharge status: Rehabilitation Facility | Payer: Medicare Other | Source: Ambulatory Visit | Attending: Internal Medicine | Admitting: Internal Medicine

## 2017-11-23 ENCOUNTER — Other Ambulatory Visit: Payer: Self-pay | Admitting: General Surgery

## 2017-11-23 ENCOUNTER — Other Ambulatory Visit: Payer: Self-pay | Admitting: Radiology

## 2017-11-23 DIAGNOSIS — J9811 Atelectasis: Secondary | ICD-10-CM | POA: Diagnosis not present

## 2017-11-23 DIAGNOSIS — Z888 Allergy status to other drugs, medicaments and biological substances status: Secondary | ICD-10-CM

## 2017-11-23 DIAGNOSIS — R05 Cough: Secondary | ICD-10-CM | POA: Diagnosis not present

## 2017-11-23 DIAGNOSIS — I2510 Atherosclerotic heart disease of native coronary artery without angina pectoris: Secondary | ICD-10-CM | POA: Diagnosis present

## 2017-11-23 DIAGNOSIS — N184 Chronic kidney disease, stage 4 (severe): Secondary | ICD-10-CM | POA: Diagnosis not present

## 2017-11-23 DIAGNOSIS — N189 Chronic kidney disease, unspecified: Secondary | ICD-10-CM | POA: Diagnosis not present

## 2017-11-23 DIAGNOSIS — J181 Lobar pneumonia, unspecified organism: Secondary | ICD-10-CM | POA: Diagnosis not present

## 2017-11-23 DIAGNOSIS — E46 Unspecified protein-calorie malnutrition: Secondary | ICD-10-CM | POA: Diagnosis not present

## 2017-11-23 DIAGNOSIS — R351 Nocturia: Secondary | ICD-10-CM | POA: Diagnosis not present

## 2017-11-23 DIAGNOSIS — D638 Anemia in other chronic diseases classified elsewhere: Secondary | ICD-10-CM | POA: Diagnosis not present

## 2017-11-23 DIAGNOSIS — T82330A Leakage of aortic (bifurcation) graft (replacement), initial encounter: Secondary | ICD-10-CM | POA: Diagnosis not present

## 2017-11-23 DIAGNOSIS — R0602 Shortness of breath: Secondary | ICD-10-CM

## 2017-11-23 DIAGNOSIS — R001 Bradycardia, unspecified: Secondary | ICD-10-CM | POA: Diagnosis present

## 2017-11-23 DIAGNOSIS — Z9841 Cataract extraction status, right eye: Secondary | ICD-10-CM | POA: Diagnosis not present

## 2017-11-23 DIAGNOSIS — G20 Parkinson's disease: Secondary | ICD-10-CM | POA: Diagnosis not present

## 2017-11-23 DIAGNOSIS — I714 Abdominal aortic aneurysm, without rupture: Principal | ICD-10-CM | POA: Diagnosis present

## 2017-11-23 DIAGNOSIS — Z8249 Family history of ischemic heart disease and other diseases of the circulatory system: Secondary | ICD-10-CM

## 2017-11-23 DIAGNOSIS — Z87891 Personal history of nicotine dependence: Secondary | ICD-10-CM | POA: Diagnosis not present

## 2017-11-23 DIAGNOSIS — IMO0002 Reserved for concepts with insufficient information to code with codable children: Secondary | ICD-10-CM

## 2017-11-23 DIAGNOSIS — T17890A Other foreign object in other parts of respiratory tract causing asphyxiation, initial encounter: Secondary | ICD-10-CM | POA: Diagnosis not present

## 2017-11-23 DIAGNOSIS — N179 Acute kidney failure, unspecified: Secondary | ICD-10-CM

## 2017-11-23 DIAGNOSIS — I130 Hypertensive heart and chronic kidney disease with heart failure and stage 1 through stage 4 chronic kidney disease, or unspecified chronic kidney disease: Secondary | ICD-10-CM | POA: Diagnosis present

## 2017-11-23 DIAGNOSIS — Z8679 Personal history of other diseases of the circulatory system: Secondary | ICD-10-CM

## 2017-11-23 DIAGNOSIS — H9190 Unspecified hearing loss, unspecified ear: Secondary | ICD-10-CM | POA: Diagnosis present

## 2017-11-23 DIAGNOSIS — J9690 Respiratory failure, unspecified, unspecified whether with hypoxia or hypercapnia: Secondary | ICD-10-CM | POA: Diagnosis not present

## 2017-11-23 DIAGNOSIS — F0390 Unspecified dementia without behavioral disturbance: Secondary | ICD-10-CM | POA: Diagnosis not present

## 2017-11-23 DIAGNOSIS — E875 Hyperkalemia: Secondary | ICD-10-CM | POA: Diagnosis present

## 2017-11-23 DIAGNOSIS — J449 Chronic obstructive pulmonary disease, unspecified: Secondary | ICD-10-CM | POA: Diagnosis present

## 2017-11-23 DIAGNOSIS — Z91041 Radiographic dye allergy status: Secondary | ICD-10-CM | POA: Diagnosis not present

## 2017-11-23 DIAGNOSIS — Z905 Acquired absence of kidney: Secondary | ICD-10-CM

## 2017-11-23 DIAGNOSIS — Z9842 Cataract extraction status, left eye: Secondary | ICD-10-CM | POA: Diagnosis not present

## 2017-11-23 DIAGNOSIS — D631 Anemia in chronic kidney disease: Secondary | ICD-10-CM | POA: Diagnosis present

## 2017-11-23 DIAGNOSIS — E8809 Other disorders of plasma-protein metabolism, not elsewhere classified: Secondary | ICD-10-CM | POA: Diagnosis present

## 2017-11-23 DIAGNOSIS — N401 Enlarged prostate with lower urinary tract symptoms: Secondary | ICD-10-CM | POA: Diagnosis not present

## 2017-11-23 DIAGNOSIS — E8770 Fluid overload, unspecified: Secondary | ICD-10-CM | POA: Diagnosis not present

## 2017-11-23 DIAGNOSIS — R531 Weakness: Secondary | ICD-10-CM | POA: Diagnosis not present

## 2017-11-23 DIAGNOSIS — J9621 Acute and chronic respiratory failure with hypoxia: Secondary | ICD-10-CM | POA: Diagnosis not present

## 2017-11-23 DIAGNOSIS — R5381 Other malaise: Secondary | ICD-10-CM | POA: Diagnosis not present

## 2017-11-23 DIAGNOSIS — J9601 Acute respiratory failure with hypoxia: Secondary | ICD-10-CM | POA: Diagnosis not present

## 2017-11-23 DIAGNOSIS — R262 Difficulty in walking, not elsewhere classified: Secondary | ICD-10-CM | POA: Diagnosis not present

## 2017-11-23 DIAGNOSIS — D696 Thrombocytopenia, unspecified: Secondary | ICD-10-CM | POA: Diagnosis not present

## 2017-11-23 DIAGNOSIS — R0902 Hypoxemia: Secondary | ICD-10-CM | POA: Diagnosis not present

## 2017-11-23 DIAGNOSIS — K55069 Acute infarction of intestine, part and extent unspecified: Secondary | ICD-10-CM | POA: Diagnosis not present

## 2017-11-23 DIAGNOSIS — I10 Essential (primary) hypertension: Secondary | ICD-10-CM | POA: Diagnosis not present

## 2017-11-23 DIAGNOSIS — R1310 Dysphagia, unspecified: Secondary | ICD-10-CM | POA: Diagnosis not present

## 2017-11-23 DIAGNOSIS — N141 Nephropathy induced by other drugs, medicaments and biological substances: Secondary | ICD-10-CM | POA: Diagnosis not present

## 2017-11-23 DIAGNOSIS — Z8711 Personal history of peptic ulcer disease: Secondary | ICD-10-CM | POA: Diagnosis not present

## 2017-11-23 DIAGNOSIS — I480 Paroxysmal atrial fibrillation: Secondary | ICD-10-CM | POA: Diagnosis present

## 2017-11-23 DIAGNOSIS — L8990 Pressure ulcer of unspecified site, unspecified stage: Secondary | ICD-10-CM | POA: Diagnosis present

## 2017-11-23 DIAGNOSIS — Z79899 Other long term (current) drug therapy: Secondary | ICD-10-CM

## 2017-11-23 DIAGNOSIS — L89892 Pressure ulcer of other site, stage 2: Secondary | ICD-10-CM

## 2017-11-23 DIAGNOSIS — I4891 Unspecified atrial fibrillation: Secondary | ICD-10-CM | POA: Diagnosis not present

## 2017-11-23 DIAGNOSIS — I129 Hypertensive chronic kidney disease with stage 1 through stage 4 chronic kidney disease, or unspecified chronic kidney disease: Secondary | ICD-10-CM | POA: Diagnosis not present

## 2017-11-23 DIAGNOSIS — Z85528 Personal history of other malignant neoplasm of kidney: Secondary | ICD-10-CM | POA: Diagnosis not present

## 2017-11-23 DIAGNOSIS — K551 Chronic vascular disorders of intestine: Secondary | ICD-10-CM | POA: Diagnosis present

## 2017-11-23 DIAGNOSIS — T508X5A Adverse effect of diagnostic agents, initial encounter: Secondary | ICD-10-CM | POA: Diagnosis not present

## 2017-11-23 DIAGNOSIS — N281 Cyst of kidney, acquired: Secondary | ICD-10-CM | POA: Diagnosis not present

## 2017-11-23 DIAGNOSIS — T82330S Leakage of aortic (bifurcation) graft (replacement), sequela: Secondary | ICD-10-CM | POA: Diagnosis not present

## 2017-11-23 DIAGNOSIS — Z862 Personal history of diseases of the blood and blood-forming organs and certain disorders involving the immune mechanism: Secondary | ICD-10-CM | POA: Diagnosis not present

## 2017-11-23 DIAGNOSIS — Z961 Presence of intraocular lens: Secondary | ICD-10-CM | POA: Diagnosis present

## 2017-11-23 DIAGNOSIS — Z9981 Dependence on supplemental oxygen: Secondary | ICD-10-CM | POA: Diagnosis not present

## 2017-11-23 DIAGNOSIS — J9809 Other diseases of bronchus, not elsewhere classified: Secondary | ICD-10-CM | POA: Diagnosis not present

## 2017-11-23 DIAGNOSIS — D61818 Other pancytopenia: Secondary | ICD-10-CM | POA: Diagnosis present

## 2017-11-23 DIAGNOSIS — N183 Chronic kidney disease, stage 3 (moderate): Secondary | ICD-10-CM | POA: Diagnosis not present

## 2017-11-23 DIAGNOSIS — R059 Cough, unspecified: Secondary | ICD-10-CM

## 2017-11-23 DIAGNOSIS — C649 Malignant neoplasm of unspecified kidney, except renal pelvis: Secondary | ICD-10-CM | POA: Diagnosis not present

## 2017-11-23 DIAGNOSIS — Q600 Renal agenesis, unilateral: Secondary | ICD-10-CM | POA: Diagnosis not present

## 2017-11-23 DIAGNOSIS — J810 Acute pulmonary edema: Secondary | ICD-10-CM | POA: Diagnosis not present

## 2017-11-23 HISTORY — DX: Pneumonia, unspecified organism: J18.9

## 2017-11-23 HISTORY — DX: Chronic or unspecified gastric ulcer with hemorrhage: K25.4

## 2017-11-23 HISTORY — DX: Unspecified atrial fibrillation: I48.91

## 2017-11-23 HISTORY — DX: Unspecified dementia, unspecified severity, without behavioral disturbance, psychotic disturbance, mood disturbance, and anxiety: F03.90

## 2017-11-23 HISTORY — DX: Unspecified osteoarthritis, unspecified site: M19.90

## 2017-11-23 LAB — CBC
HEMATOCRIT: 28.3 % — AB (ref 39.0–52.0)
HEMOGLOBIN: 9.2 g/dL — AB (ref 13.0–17.0)
MCH: 31 pg (ref 26.0–34.0)
MCHC: 32.5 g/dL (ref 30.0–36.0)
MCV: 95.3 fL (ref 78.0–100.0)
PLATELETS: 80 10*3/uL — AB (ref 150–400)
RBC: 2.97 MIL/uL — AB (ref 4.22–5.81)
RDW: 14.6 % (ref 11.5–15.5)
WBC: 3.2 10*3/uL — AB (ref 4.0–10.5)

## 2017-11-23 LAB — COMPREHENSIVE METABOLIC PANEL
ALT: 7 U/L — AB (ref 17–63)
AST: 19 U/L (ref 15–41)
Albumin: 3.5 g/dL (ref 3.5–5.0)
Alkaline Phosphatase: 46 U/L (ref 38–126)
Anion gap: 7 (ref 5–15)
BILIRUBIN TOTAL: 0.6 mg/dL (ref 0.3–1.2)
BUN: 32 mg/dL — AB (ref 6–20)
CHLORIDE: 106 mmol/L (ref 101–111)
CO2: 26 mmol/L (ref 22–32)
CREATININE: 2.33 mg/dL — AB (ref 0.61–1.24)
Calcium: 8.4 mg/dL — ABNORMAL LOW (ref 8.9–10.3)
GFR calc Af Amer: 27 mL/min — ABNORMAL LOW (ref >60)
GFR, EST NON AFRICAN AMERICAN: 23 mL/min — AB (ref >60)
Glucose, Bld: 123 mg/dL — ABNORMAL HIGH (ref 65–99)
Potassium: 3.6 mmol/L (ref 3.5–5.1)
Sodium: 139 mmol/L (ref 135–145)
Total Protein: 5.7 g/dL — ABNORMAL LOW (ref 6.5–8.1)

## 2017-11-23 LAB — PROTIME-INR
INR: 1.27
Prothrombin Time: 15.8 seconds — ABNORMAL HIGH (ref 11.4–15.2)

## 2017-11-23 MED ORDER — HYDRALAZINE HCL 20 MG/ML IJ SOLN: 5.0000 mg | Freq: Four times a day (QID) | INTRAMUSCULAR | Status: DC | PRN

## 2017-11-23 MED ORDER — PREGABALIN 50 MG PO CAPS
50.0000 mg | ORAL_CAPSULE | Freq: Every day | ORAL | Status: DC
  Administered 2017-11-23 – 2017-11-26 (×4): 50 mg via ORAL
  Filled 2017-11-23 (×4): qty 1

## 2017-11-23 MED ORDER — HYDRALAZINE HCL 25 MG PO TABS
25.0000 mg | ORAL_TABLET | Freq: Three times a day (TID) | ORAL | Status: DC
  Administered 2017-11-23 – 2017-11-24 (×3): 25 mg via ORAL
  Filled 2017-11-23 (×4): qty 1

## 2017-11-23 MED ORDER — SODIUM CHLORIDE 0.9 % IV SOLN
INTRAVENOUS | Status: DC
  Administered 2017-11-23 – 2017-11-24 (×2): via INTRAVENOUS

## 2017-11-23 MED ORDER — SE-TAN PLUS 162-115.2-1 MG PO CAPS: 1.0000 | ORAL_CAPSULE | Freq: Every day | ORAL | Status: DC

## 2017-11-23 MED ORDER — CARBIDOPA-LEVODOPA ER 25-100 MG PO TBCR
0.5000 | EXTENDED_RELEASE_TABLET | Freq: Two times a day (BID) | ORAL | Status: DC
  Administered 2017-11-23 – 2017-11-29 (×11): 0.5 via ORAL
  Filled 2017-11-23 (×13): qty 0.5

## 2017-11-23 MED ORDER — LABETALOL HCL 100 MG PO TABS
100.0000 mg | ORAL_TABLET | Freq: Two times a day (BID) | ORAL | Status: DC
  Administered 2017-11-23 – 2017-11-26 (×6): 100 mg via ORAL
  Filled 2017-11-23 (×6): qty 1

## 2017-11-23 MED ORDER — HYDROCHLOROTHIAZIDE 25 MG PO TABS
12.5000 mg | ORAL_TABLET | Freq: Every day | ORAL | Status: DC
  Administered 2017-11-24: 12.5 mg via ORAL
  Filled 2017-11-23 (×2): qty 1

## 2017-11-23 MED ORDER — DOCUSATE SODIUM 100 MG PO CAPS
100.0000 mg | ORAL_CAPSULE | Freq: Every day | ORAL | Status: DC
  Administered 2017-11-24 – 2017-11-29 (×6): 100 mg via ORAL
  Filled 2017-11-23 (×6): qty 1

## 2017-11-23 MED ORDER — PREDNISOLONE ACETATE 1 % OP SUSP
1.0000 [drp] | Freq: Two times a day (BID) | OPHTHALMIC | Status: DC
  Administered 2017-11-23 – 2017-11-29 (×12): 1 [drp] via OPHTHALMIC
  Filled 2017-11-23 (×4): qty 1

## 2017-11-23 MED ORDER — DONEPEZIL HCL 5 MG PO TABS
5.0000 mg | ORAL_TABLET | Freq: Every day | ORAL | Status: DC
  Administered 2017-11-23 – 2017-11-28 (×6): 5 mg via ORAL
  Filled 2017-11-23 (×6): qty 1

## 2017-11-23 MED ORDER — ACETYLCYSTEINE 20 % IN SOLN
600.0000 mg | Freq: Two times a day (BID) | RESPIRATORY_TRACT | Status: AC
  Administered 2017-11-23 – 2017-11-24 (×2): 600 mg via ORAL
  Filled 2017-11-23 (×2): qty 4

## 2017-11-23 NOTE — H&P (Signed)
History and Physical  [name] FTD:[id number] DOB: 10/08/1927 DOA: 11/23/2017  Referring physician: Dr. Maurene Capes PCP: Colon Branch, MD  Outpatient Specialists: Nephrology Dr. Dimas Aguas [facility] [place], [place]) Patient coming from: Home  Chief Complaint: Direct admission from nephrologist for AAA endoleak repair by IR  HPI: [name] is a 83 y.o. male with medical history significant for AAA with prior endovascular repair and angiogram 10/04/2008, renal cell CA with solitary left kidney, HTN, COPD, chronic anemia/pancytopenia who presented today 11/23/17 as a direct admit from his nephrologist office for possible AAA endoleak repair by IR. Patient is in the room accompanied by his daughter. Patiient is hard of hearing even with ear piece in place. States that routine check detected leak in AAA. Patient lives alone, takes care of activity of daily living except driving. Has been asymptomatic.  Review of Systems:  Pt denies any chest pain, abdominal pain, palpitation or dyspnea.  Review of systems are otherwise negative.   Past Medical History:  Diagnosis Date  . Abdominal aortic aneurysm ([place]) 09/2008   s/o endovascular repair and angiogram 10/04/08   . Anemia    EGD Cscope 08-2012  . Arthritis    "hips" (11/23/2017)  . Bleeding stomach ulcer 1990s  . Bleeding ulcer 1997  . CKD (chronic kidney disease), stage IV (HCC)    Stable, Dr. Dimas Aguas  . COPD (chronic obstructive pulmonary disease) ([place])    "mild" (11/23/2017)  . Dementia    "on very small dose of Aricept" (11/23/2017)  . H/O hyperkalemia    Stable  . Hypertension   . Leukopenia 2/11   thought to be from doxy  . Pneumonia 2018 X 2  . Renal cell carcinoma    clear cell type dx 10/09, s/p excision 10-24-08  . Renal insufficiency   . Solitary left kidney    W/ chronic tubule-interstitial damage, Dr. Dimas Aguas  . Spinal stenosis    causing LE weakness-persistant, being worked up by Hydrographic surveyor and neurologist    Past Surgical History:  Procedure Laterality Date  . ABDOMINAL AORTIC ANEURYSM REPAIR  2009   EVAR  . CATARACT EXTRACTION W/ INTRAOCULAR LENS  IMPLANT, BILATERAL Bilateral   . HEMORRHOID SURGERY  1990  . IR RADIOLOGIST EVAL & MGMT  09/07/2017  . [place] ([place]), 2004  . NEPHRECTOMY  2009   Open nephrectomy for cancer    Social History:  reports that he quit smoking about 9 years ago. He has a 90.00 pack-year smoking history. he has never used smokeless tobacco. He reports that he does not drink alcohol or use drugs.   Allergies  Allergen Reactions  . Iodinated Diagnostic Agents Other (See Comments)    Only one kidney   . Ioxaglate Other (See Comments)    Only one kidney   . Baclofen Other (See Comments)    Unable to walk per Pt's family     Family History  Problem Relation Age of Onset  . Diabetes Father        ?  . Stroke Father   . Hypertension Father   . Colon cancer Neg Hx   . Prostate cancer Neg Hx      Prior to Admission medications   Medication Sig Start Date End Date Taking? Authorizing Provider  Carbidopa-Levodopa ER (SINEMET CR) 25-100 MG tablet controlled release Take 0.5 tablets by mouth 2 (two) times daily.   Yes [provider]  Cholecalciferol (VITAMIN D) 2000 units CAPS Take  2,000 Units by mouth daily.   Yes [provider]  docusate sodium (COLACE) 100 MG capsule Take 100 mg by mouth daily.   Yes [provider]  donepezil (ARICEPT) 5 MG tablet Take 5 mg by mouth at bedtime.   Yes [provider]  FeFum-FePo-FA-B Cmp-C-Zn-Mn-Cu (SE-TAN PLUS) 162-115.2-1 MG CAPS Take 1 capsule by mouth daily. Take 1 Tab  daily   Yes [provider]  hydrALAZINE (APRESOLINE) 25 MG tablet Take 25 mg by mouth 3 (three) times daily.   Yes [provider]  hydrochlorothiazide (HYDRODIURIL) 12.5 MG tablet Take 12.5 mg daily by mouth.   Yes [provider]  labetalol (NORMODYNE) 100 MG tablet Take 1  tablet (100 mg total) by mouth 2 (two) times daily. 07/14/15  Yes Paz, Alda Berthold, MD  prednisoLONE acetate (PRED FORTE) 1 % ophthalmic suspension Place 1 drop into both eyes 2 (two) times daily. 11/16/17  Yes [provider]  pregabalin (LYRICA) 50 MG capsule Take 50 mg by mouth at bedtime.    Yes [provider]  sodium polystyrene (KAYEXALATE) 15 GM/60ML suspension Take 15 g by mouth 2 (two) times a week. Take 15 Grams on Monday and Friday   Yes [provider]    Physical Exam: BP (!) 169/74 (BP Location: Right Arm)   Pulse 66   Temp 98.2 F (36.8 C) (Oral)   Resp 16   Ht 5\' 8"  (1.727 m)   SpO2 96%   BMI 24.37 kg/m   General:  83 yo CM WD WN NAD Eyes: Anicteric sclerae, pupils round and reactive to light ENT: Mucosa is moist no erytema Neck: No JVD, no thyromegaly  Cardiovascular: RRR no rubs or gallops Respiratory: CTA no wheezes or rhonchi Abdomen: Soft NT ND NBS x4 quadrants Skin: No noted open lesions Musculoskeletal: Moves all limbs. Dorsalis pedis pulses 2/4 bilaterally. Trace edema LE bilaterally Psychiatric: Mood is appropriate for condiiton and setting. Neurologic:  Pt is very hard of hearing         Labs on Admission:  Basic Metabolic Panel: No results for input(s): NA, K, CL, CO2, GLUCOSE, BUN, CREATININE, CALCIUM, MG, PHOS in the last 168 hours. Liver Function Tests: No results for input(s): AST, ALT, ALKPHOS, BILITOT, PROT, ALBUMIN in the last 168 hours. No results for input(s): LIPASE, AMYLASE in the last 168 hours. No results for input(s): AMMONIA in the last 168 hours. CBC: No results for input(s): WBC, NEUTROABS, HGB, HCT, MCV, PLT in the last 168 hours. Cardiac Enzymes: No results for input(s): CKTOTAL, CKMB, CKMBINDEX, TROPONINI in the last 168 hours.  BNP (last 3 results) No results for input(s): BNP in the last 8760 hours.  ProBNP (last 3 results) No results for input(s): PROBNP in the last 8760 hours.  CBG: No results  for input(s): GLUCAP in the last 168 hours.  Radiological Exams on Admission: No results found.   Assessment/Plan Present on Admission: **None**  Active Problems:   Pressure injury of skin  AAA endoleak  -IR consulted for repair -INR -CBC -NPO after midnight -Monitor VS -IV fluid NS at 100cc/hr  HTN -well controlled -hydralazine, HCTZ, labetalol  Chronic constipation -docusate  Ambulatory dysfunction -PT to evaluate and treat when more stable  Hx of renal cell carcinoma with solitary kidney -IV fluid hydration prior to endoleak repair for AAA to avoid acute renal impairement -BMP  Hx of anemia/pancytopenia -No labs prior to arrival -CBC  DVT prophylaxis: heparin sq 5000 U TID  Code Status:  Full  Family Communication: With daughter at bedside. All questions answered to her satisfaction  Disposition Plan: Will stay another midnight to have AAA endoleak repair by IR  Consults called: IR for repair of endoleak AAA  Admission status: Inpatient status. Admitted to telemetry.    Kayleen Memos MD [facility] Pager [phone]  If 7PM-7AM, please contact night-coverage [email or web address] Password [facility]  11/23/2017, 7:04 PM

## 2017-11-23 NOTE — Progress Notes (Signed)
Patient accepted on to unit. Changed in gown. Made comfortable in bed. Admitting MD paged to give admitting orders Francia Greaves MD. Skin assessment completed. Vital signs completed. Awaiting orders.

## 2017-11-24 ENCOUNTER — Encounter (HOSPITAL_COMMUNITY): Payer: Self-pay

## 2017-11-24 ENCOUNTER — Inpatient Hospital Stay (HOSPITAL_COMMUNITY): Payer: Medicare Other | Admitting: Anesthesiology

## 2017-11-24 ENCOUNTER — Inpatient Hospital Stay (HOSPITAL_COMMUNITY): Payer: Medicare Other

## 2017-11-24 ENCOUNTER — Encounter (HOSPITAL_COMMUNITY)
Admission: RE | Disposition: A | Discharge status: Rehabilitation Facility | Payer: Self-pay | Source: Ambulatory Visit | Attending: Internal Medicine

## 2017-11-24 ENCOUNTER — Inpatient Hospital Stay (HOSPITAL_COMMUNITY)
Admission: RE | Admit: 2017-11-24 | Payer: Medicare Other | Source: Ambulatory Visit | Admitting: Interventional Radiology

## 2017-11-24 ENCOUNTER — Ambulatory Visit: Payer: Medicare Other | Admitting: Physical Therapy

## 2017-11-24 DIAGNOSIS — E8770 Fluid overload, unspecified: Secondary | ICD-10-CM

## 2017-11-24 DIAGNOSIS — N184 Chronic kidney disease, stage 4 (severe): Secondary | ICD-10-CM

## 2017-11-24 HISTORY — PX: IR US GUIDE VASC ACCESS RIGHT: IMG2390

## 2017-11-24 HISTORY — PX: IR ANGIOGRAM SELECTIVE EACH ADDITIONAL VESSEL: IMG667

## 2017-11-24 HISTORY — PX: IR ANGIOGRAM PELVIS SELECTIVE OR SUPRASELECTIVE: IMG661

## 2017-11-24 HISTORY — PX: IR ANGIOGRAM VISCERAL SELECTIVE: IMG657

## 2017-11-24 LAB — CBC
HCT: 27.5 % — ABNORMAL LOW (ref 39.0–52.0)
HEMOGLOBIN: 8.7 g/dL — AB (ref 13.0–17.0)
MCH: 31 pg (ref 26.0–34.0)
MCHC: 31.6 g/dL (ref 30.0–36.0)
MCV: 97.9 fL (ref 78.0–100.0)
Platelets: 80 10*3/uL — ABNORMAL LOW (ref 150–400)
RBC: 2.81 MIL/uL — AB (ref 4.22–5.81)
RDW: 14.8 % (ref 11.5–15.5)
WBC: 2.4 10*3/uL — ABNORMAL LOW (ref 4.0–10.5)

## 2017-11-24 LAB — BASIC METABOLIC PANEL
Anion gap: 6 (ref 5–15)
BUN: 32 mg/dL — AB (ref 6–20)
CHLORIDE: 109 mmol/L (ref 101–111)
CO2: 27 mmol/L (ref 22–32)
CREATININE: 2.44 mg/dL — AB (ref 0.61–1.24)
Calcium: 8.2 mg/dL — ABNORMAL LOW (ref 8.9–10.3)
GFR calc Af Amer: 26 mL/min — ABNORMAL LOW (ref >60)
GFR calc non Af Amer: 22 mL/min — ABNORMAL LOW (ref >60)
GLUCOSE: 95 mg/dL (ref 65–99)
POTASSIUM: 3.8 mmol/L (ref 3.5–5.1)
Sodium: 142 mmol/L (ref 135–145)

## 2017-11-24 SURGERY — IR WITH ANESTHESIA
Anesthesia: General

## 2017-11-24 MED ORDER — FENTANYL CITRATE (PF) 100 MCG/2ML IJ SOLN
INTRAMUSCULAR | Status: AC | PRN
  Administered 2017-11-24 (×2): 50 ug via INTRAVENOUS

## 2017-11-24 MED ORDER — FENTANYL CITRATE (PF) 100 MCG/2ML IJ SOLN
INTRAMUSCULAR | Status: AC
  Filled 2017-11-24: qty 4

## 2017-11-24 MED ORDER — IODIXANOL 320 MG/ML IV SOLN: 46.0000 mL | Freq: Once | INTRAVENOUS | Status: DC | PRN

## 2017-11-24 MED ORDER — OXYCODONE HCL 5 MG/5ML PO SOLN: 5.0000 mg | Freq: Once | ORAL | Status: DC | PRN

## 2017-11-24 MED ORDER — FENTANYL CITRATE (PF) 100 MCG/2ML IJ SOLN: 25.0000 ug | INTRAMUSCULAR | Status: DC | PRN

## 2017-11-24 MED ORDER — MIDAZOLAM HCL 2 MG/2ML IJ SOLN
INTRAMUSCULAR | Status: AC | PRN
  Administered 2017-11-24 (×2): 1 mg via INTRAVENOUS

## 2017-11-24 MED ORDER — FUROSEMIDE 10 MG/ML IJ SOLN
80.0000 mg | Freq: Once | INTRAMUSCULAR | Status: AC
  Administered 2017-11-24: 80 mg via INTRAVENOUS
  Filled 2017-11-24: qty 8

## 2017-11-24 MED ORDER — LIDOCAINE HCL 1 % IJ SOLN
INTRAMUSCULAR | Status: AC
  Filled 2017-11-24: qty 20

## 2017-11-24 MED ORDER — FUROSEMIDE 10 MG/ML IJ SOLN
40.0000 mg | Freq: Once | INTRAMUSCULAR | Status: AC
  Administered 2017-11-24: 40 mg via INTRAVENOUS
  Filled 2017-11-24: qty 4

## 2017-11-24 MED ORDER — MIDAZOLAM HCL 2 MG/2ML IJ SOLN
INTRAMUSCULAR | Status: AC
  Filled 2017-11-24: qty 4

## 2017-11-24 MED ORDER — ONDANSETRON HCL 4 MG/2ML IJ SOLN: 4.0000 mg | Freq: Four times a day (QID) | INTRAMUSCULAR | Status: DC | PRN

## 2017-11-24 MED ORDER — OXYCODONE HCL 5 MG PO TABS: 5.0000 mg | ORAL_TABLET | Freq: Once | ORAL | Status: DC | PRN

## 2017-11-24 MED ORDER — LIDOCAINE HCL (PF) 1 % IJ SOLN
INTRAMUSCULAR | Status: AC | PRN
  Administered 2017-11-24: 8 mL

## 2017-11-24 NOTE — Sedation Documentation (Signed)
Patient is resting comfortably. 

## 2017-11-24 NOTE — Consult Note (Signed)
[facility] CM Primary Care Navigator  11/24/2017  [name] [birth year] [id number]   Went to see patient in the room to identify possible discharge needs but staff reports that patient is still off the unit for his procedure/ surgery. (Repair Endoleak)  Will attempt to meet with patient at another time when patient is available in the room.    Addendum 11/25/17):  Went back to see patient to identify possible discharge needs and met with patient and daughter [name]). Patient was dozing on and off on the recliner during this visit. Daughter reports that patient was directed here from provider's office for possible abdominal aortic aneurysm endoleak repair which led to this admission/ surgery.  Patient's daughter endorses Dr. Kathlene November with [facility] as the primary care provider.   [facility] [address] [facility].   Daughter reports that she and her husband [name]) manage medications for patient at homeusing "pill box" system filled weekly.  Patient's daughter has been providing transportation to patient's doctors'appointments.   According to daughter, patient lives alone prior to admission and daughter lives a mile away from where he lives. Daughter is the primary caregiver at home.  Anticipated discharge plan is skilled nursing facility (SNF) per therapy recommendation. Daughter was hopeful that patient will be discharged home with home health services instead.She mentioned that she can move in to patient's house as he recovers if needed.  Patient's daughter voiced understanding to call primary care provider'sofficewhen patientreturns backhome,for a post discharge follow-up appointment within a week or sooner if needs arise.Patient letter (with PCP's contact number) was provided as areminder.   Discussed with patient's daughter regarding [facility] CM  services available for health managementat home but she denies any current needs or concerns for now.According to daughter, COPD was mentioned by primary care provider about 10 years ago but patient has not had any issues related to it and he has not used any medication for it. Patient's daughter expressed understanding to seekreferral from primary care provider to [facility] care management ifdeemed necessary and appropriatefor services in the future.   [facility] care management information was provided for future needs that patient may have.  Patient/ daughter had opted and verbally agreed for EMMI calls to follow-up patient's recovery at home in case it will bepossibleforpatientto be discharging to home from here.  Referral made for [facility] General calls after discharge.   For questions, please contact:  Dannielle Huh, BSN, RN- [facility] Primary Care Navigator  Telephone: [phone] [place]

## 2017-11-24 NOTE — Consult Note (Signed)
Chief Complaint: Patient was seen in consultation today for abdominal aortic aneurysm Type II endoleak  Referring Physician(s): Dr. Trula Slade  Supervising Physician: Jacqulynn Cadet  Patient Status: [facility] - In-pt  History of Present Illness: [name] is a 83 y.o. male with a history of abdominal aortic aneurysm and right-sided renal cell carcinoma. He underwent endovascular repair of his infrarenal abdominal aortic aneurysm on 10/04/2008 by Dr. Trula Slade and shortly after had a right nephrectomy for his right-sided renal cell carcinoma. Since nephrectomy his baseline creatinine has been 2.2-2.3. Ongoing surveillance of his AAA has showed an enlarging aneurysm suspicious for endoleak.    CT Abdomen 07/20/17 showed: 1. Right nephrectomy. No evidence of metastatic disease. Largest left renal low-attenuation lesion is slightly smaller and previously-seen dependent high attenuation is no longer visualized. Sonographic evaluation on 05/05/2017 suggested a mildly complex cyst. 2. Aorto bi-iliac stent graft repair of an infrarenal aortic aneurysm, now measuring 7.0 cm, with steady enlargement from prior exams.  Patient met with Dr. Laurence Ferrari in the Interventional Radiology clinic 09/07/17 to discuss management of his Type II endoleak.  He and his daughter would like to pursue elective repair.  Given his chronic renal disease, the patient was admitted overnight for IV hydration and administration of mucomyst prior to procedure.   He is anticipating procedure today.  He has been NPO.   Past Medical History:  Diagnosis Date  . Abdominal aortic aneurysm ([place]) 09/2008   s/o endovascular repair and angiogram 10/04/08   . Anemia    EGD Cscope 08-2012  . Arthritis    "hips" (11/23/2017)  . Bleeding stomach ulcer 1990s  . Bleeding ulcer 1997  . CKD (chronic kidney disease), stage IV (HCC)    Stable, Dr. Dimas Aguas  . COPD (chronic obstructive pulmonary disease) ([place])    "mild"  (11/23/2017)  . Dementia    "on very small dose of Aricept" (11/23/2017)  . H/O hyperkalemia    Stable  . Hypertension   . Leukopenia 2/11   thought to be from doxy  . Pneumonia 2018 X 2  . Renal cell carcinoma    clear cell type dx 10/09, s/p excision 10-24-08  . Renal insufficiency   . Solitary left kidney    W/ chronic tubule-interstitial damage, Dr. Dimas Aguas  . Spinal stenosis    causing LE weakness-persistant, being worked up by Hydrographic surveyor and neurologist    Past Surgical History:  Procedure Laterality Date  . ABDOMINAL AORTIC ANEURYSM REPAIR  2009   EVAR  . CATARACT EXTRACTION W/ INTRAOCULAR LENS  IMPLANT, BILATERAL Bilateral   . HEMORRHOID SURGERY  1990  . IR RADIOLOGIST EVAL & MGMT  09/07/2017  . [place] 2004  . NEPHRECTOMY  2009   Open nephrectomy for cancer    Allergies: Iodinated diagnostic agents; Ioxaglate; and Baclofen  Medications: Prior to Admission medications   Medication Sig Start Date End Date Taking? Authorizing Provider  Carbidopa-Levodopa ER (SINEMET CR) 25-100 MG tablet controlled release Take 0.5 tablets by mouth 2 (two) times daily.   Yes [provider]  Cholecalciferol (VITAMIN D) 2000 units CAPS Take 2,000 Units by mouth daily.   Yes [provider]  docusate sodium (COLACE) 100 MG capsule Take 100 mg by mouth daily.   Yes [provider]  donepezil (ARICEPT) 5 MG tablet Take 5 mg by mouth at bedtime.   Yes [provider]  FeFum-FePo-FA-B Cmp-C-Zn-Mn-Cu (SE-TAN PLUS) 162-115.2-1 MG CAPS Take 1 capsule by mouth  daily. Take 1 Tab  daily   Yes [provider]  hydrALAZINE (APRESOLINE) 25 MG tablet Take 25 mg by mouth 3 (three) times daily.   Yes [provider]  hydrochlorothiazide (HYDRODIURIL) 12.5 MG tablet Take 12.5 mg daily by mouth.   Yes [provider]  labetalol (NORMODYNE) 100 MG tablet Take 1 tablet (100 mg total) by mouth 2 (two) times daily.  07/14/15  Yes Paz, Alda Berthold, MD  prednisoLONE acetate (PRED FORTE) 1 % ophthalmic suspension Place 1 drop into both eyes 2 (two) times daily. 11/16/17  Yes [provider]  pregabalin (LYRICA) 50 MG capsule Take 50 mg by mouth at bedtime.    Yes [provider]  sodium polystyrene (KAYEXALATE) 15 GM/60ML suspension Take 15 g by mouth 2 (two) times a week. Take 15 Grams on Monday and Friday   Yes [provider]     Family History  Problem Relation Age of Onset  . Diabetes Father        ?  . Stroke Father   . Hypertension Father   . Colon cancer Neg Hx   . Prostate cancer Neg Hx     Social History   Socioeconomic History  . Marital status: Widowed    Spouse name: None  . Number of children: 3  . Years of education: None  . Highest education level: None  Social Needs  . Financial resource strain: None  . Food insecurity - worry: None  . Food insecurity - inability: None  . Transportation needs - medical: None  . Transportation needs - non-medical: None  Occupational History  . Occupation: retired    Fish farm manager: RETIRED  Tobacco Use  . Smoking status: Former Smoker    Packs/day: 1.50    Years: 60.00    Pack years: 90.00    Last attempt to quit: 12/28/2007    Years since quitting: 9.9  . Smokeless tobacco: Never Used  Substance and Sexual Activity  . Alcohol use: No    Alcohol/week: 0.0 oz  . Drug use: No  . Sexual activity: None  Other Topics Concern  . None  Social History Narrative   Lives by himself    Lost wife to dementia 10-2015   Totally independent on ADL , no driving   Often came to office w/ his daughter [name] [phone]), his other daughter is  [name] , son is Sudeep                    Review of Systems  Constitutional: Negative for fatigue and fever.  Respiratory: Positive for cough. Negative for shortness of breath.   Cardiovascular: Negative for chest pain.  Gastrointestinal: Negative for abdominal pain.  Musculoskeletal:  Negative for back pain.  Psychiatric/Behavioral: Negative for behavioral problems and confusion.    Vital Signs: BP (!) 145/58 (BP Location: Left Arm)   Pulse 60   Temp 98.4 F (36.9 C) (Oral)   Resp 16   Ht _0  (1.727 m)   SpO2 90%   BMI 24.37 kg/m   Physical Exam  Constitutional: He is oriented to person, place, and time. He appears well-developed.  Cardiovascular: Normal rate, regular rhythm and normal heart sounds.  Pulmonary/Chest: Effort normal. No respiratory distress.  Mild crackles  Abdominal: Soft.  Neurological: He is alert and oriented to person, place, and time.  Skin: Skin is warm and dry.  Psychiatric: He has a normal mood and affect. His behavior is normal. Judgment and thought content  normal.  Nursing note and vitals reviewed.   Imaging: No results found.  Labs:  CBC: Recent Labs    05/27/17 1530 11/23/17 1953 11/24/17 0623  WBC 2.3* 3.2* 2.4*  HGB 9.7* 9.2* 8.7*  HCT 29.9* 28.3* 27.5*  PLT 91* 80* 80*    COAGS: Recent Labs    11/23/17 1830  INR 1.27    BMP: Recent Labs    05/27/17 1530 09/22/17 11/23/17 1953 11/24/17 0623  NA 146 146 139 142  K 4.2 4.5 3.6 3.8  CL 109  --  106 109  CO2 28  --  26 27  GLUCOSE 124*  --  123* 95  BUN 31* 32* 32* 32*  CALCIUM 8.4*  --  8.4* 8.2*  CREATININE 2.82* 2.3* 2.33* 2.44*  GFRNONAA  --   --  23* 22*  GFRAA  --   --  27* 26*    LIVER FUNCTION TESTS: Recent Labs    11/23/17 1953  BILITOT 0.6  AST 19  ALT 7*  ALKPHOS 46  PROT 5.7*  ALBUMIN 3.5    TUMOR MARKERS: No results for input(s): AFPTM, CEA, CA199, CHROMGRNA in the last 8760 hours.  Assessment and Plan: Patient with past medical history of infrarenal abdominal aortic aneurysm repair presents with complaint of Type II endoleak.  IR consulted for endoleak repair at the request of Dr. Arita Miss. Both patient and daughter have met with Dr. Laurence Ferrari to discussion risks and benefits of procedure and they elect to proceed with  repair.  Patient was stable overnight. He has received IV hydration and mucomyst as ordered.  He has been NPO and is not currently on blood thinners.  Risks and benefits were discussed with the patient including, but not limited to bleeding, infection, vascular injury or contrast induced renal failure.  This interventional procedure involves the use of X-rays and because of the nature of the planned procedure, it is possible that we will have prolonged use of X-ray fluoroscopy.  Potential radiation risks to you include (but are not limited to) the following: - A slightly elevated risk for cancer  several years later in life. This risk is typically less than 0.5% percent. This risk is low in comparison to the normal incidence of human cancer, which is 33% for women and 50% for men according to the [employer]. - Radiation induced injury can include skin redness, resembling a rash, tissue breakdown / ulcers and hair loss (which can be temporary or permanent).   The likelihood of either of these occurring depends on the difficulty of the procedure and whether you are sensitive to radiation due to previous procedures, disease, or genetic conditions.   IF your procedure requires a prolonged use of radiation, you will be notified and given written instructions for further action.  It is your responsibility to monitor the irradiated area for the 2 weeks following the procedure and to notify your physician if you are concerned that you have suffered a radiation induced injury.    All of the patient's questions were answered, patient is agreeable to proceed.  Consent signed and in chart.   Thank you for this interesting consult.  I greatly enjoyed meeting [name] and look forward to participating in their care.  A copy of this report was sent to the requesting provider on this date.  Electronically Signed: Docia Barrier, PA 11/24/2017, 10:12 AM   I spent a total of     25 Minutes in face to  face in clinical consultation, greater than 50% of which was counseling/coordinating care for Type II endoleak of infrarenal abdominal aneurysm

## 2017-11-24 NOTE — Procedures (Signed)
Interventional Radiology Procedure Note  Procedure: Selective and super selective angiography.  Successful catheterization of the residual IMA through the SMA -> middle colic -> marginal artery -> left colic arcade.  The IMA origin is occluded, no evidence of endoleak.  No evidence of endoleak from the right internal iliac artery or the lumbar collaterals.   Vascular Access: Right CFA, 70F --> AngioSeal  Complications: None  Estimated Blood Loss: None  Recommendations: - Bedrest x 4 hrs - Hydrate but avoid fluid overload - Greatly appreciate assistance from [facility]!  Signed,  Criselda Peaches, MD

## 2017-11-24 NOTE — Anesthesia Preprocedure Evaluation (Signed)
Anesthesia Evaluation  Patient identified by MRN, date of birth, ID band Patient awake    Reviewed: Allergy & Precautions, H&P , NPO status , Patient's Chart, lab work & pertinent test results  Airway Mallampati: II   Neck ROM: full    Dental   Pulmonary COPD, former smoker,    breath sounds clear to auscultation       Cardiovascular hypertension, + Peripheral Vascular Disease   Rhythm:regular Rate:Normal  AAA s/p endovascular repair.  Now with endo-leak   Neuro/Psych    GI/Hepatic PUD, GERD  ,  Endo/Other    Renal/GU Renal InsufficiencyRenal disease     Musculoskeletal  (+) Arthritis ,   Abdominal   Peds  Hematology  (+) Blood dyscrasia, anemia , PLTS 80   Anesthesia Other Findings   Reproductive/Obstetrics                             Anesthesia Physical Anesthesia Plan  ASA: IV  Anesthesia Plan: General   Post-op Pain Management:    Induction: Intravenous  PONV Risk Score and Plan: 2 and Ondansetron and Treatment may vary due to age or medical condition  Airway Management Planned: Oral ETT  Additional Equipment:   Intra-op Plan:   Post-operative Plan: Extubation in OR  Informed Consent: I have reviewed the patients History and Physical, chart, labs and discussed the procedure including the risks, benefits and alternatives for the proposed anesthesia with the patient or authorized representative who has indicated his/her understanding and acceptance.     Plan Discussed with: CRNA, Anesthesiologist and Surgeon  Anesthesia Plan Comments:         Anesthesia Quick Evaluation

## 2017-11-24 NOTE — Progress Notes (Signed)
Patient back from OR comfortable in bed. Patient has to remain flat until 430pm. Per MD order. Patient now satting at 99% on 3L of O2 post Lasix 80mg  IV.  Will continue to ween off O2.

## 2017-11-24 NOTE — Anesthesia Procedure Notes (Signed)
Arterial Line Insertion Start/End11/29/2018 10:34 AM, 11/24/2017 10:42 AM Performed by: Albertha Ghee, MD, Josephine Igo, CRNA, CRNA  Preanesthetic checklist: patient identified, IV checked, site marked, risks and benefits discussed, surgical consent, monitors and equipment checked, pre-op evaluation, timeout performed and anesthesia consent Left, radial was placed Catheter size: 20 G Hand hygiene performed  and maximum sterile barriers used  Allen's test indicative of satisfactory collateral circulation Attempts: 1 Procedure performed without using ultrasound guided technique. Ultrasound Notes:anatomy identified Following insertion, dressing applied and Biopatch. Post procedure assessment: normal  Patient tolerated the procedure well with no immediate complications.

## 2017-11-24 NOTE — Progress Notes (Signed)
[facility] PROGRESS NOTE  [name] HKV:[id number] DOB: 06-10-1927 DOA: 11/23/2017  PCP: Colon Branch, MD  Brief History/Interval Summary: 83 year old Caucasian male with a past medical history of AAA with prior endovascular repair, history of renal cell carcinoma with solitary left kidney, history of hypertension, COPD was sent over from his nephrologist for evaluation of possible AAA endoleak.  Patient was referred to interventional radiology who recommended admission to the hospital.  Reason for Visit: Chronic kidney disease stage IV  Consultants: Interventional radiology  Procedures: Selective angiography which did not reveal any AAA endoleak.  Antibiotics: None  Subjective/Interval History: Patient is very hard of hearing.  His daughter is at the bedside.  Patient denies any cough, shortness of breath.  Denies any fever or chills recently.  Denies any nausea or vomiting.  ROS: No chest pain  Objective:  Vital Signs  Vitals:   11/24/17 1257 11/24/17 1301 11/24/17 1315 11/24/17 1400  BP: (!) 164/75 (!) 167/77 (!) 174/77 (!) 154/86  Pulse: 78 62 70 62  Resp: 18 18 19 18   Temp:      TempSrc:      SpO2: 97% 100% 99% 91%  Weight:    73.7 kg (162 lb 7.7 oz)  Height:        Intake/Output Summary (Last 24 hours) at 11/24/2017 1430 Last data filed at 11/24/2017 0800 Gross per 24 hour  Intake 1628.33 ml  Output 650 ml  Net 978.33 ml   Filed Weights   11/24/17 1020 11/24/17 1400  Weight: 72.6 kg (160 lb) 73.7 kg (162 lb 7.7 oz)    General appearance: alert, appears stated age and no distress Resp: Diminished air entry at the bases with crackles.  Minimal wheezing.  Does not appear to be tachypneic. Cardio: regular rate and rhythm, S1, S2 normal, no murmur, click, rub or gallop GI: soft, non-tender; bowel sounds normal; no masses,  no organomegaly Extremities: edema 1-2+ pitting Neurologic: Awake alert.  No focal neurological deficits.  Lab  Results:  Data Reviewed: I have personally reviewed following labs and imaging studies  CBC: Recent Labs  Lab 11/23/17 1953 11/24/17 0623  WBC 3.2* 2.4*  HGB 9.2* 8.7*  HCT 28.3* 27.5*  MCV 95.3 97.9  PLT 80* 80*    Basic Metabolic Panel: Recent Labs  Lab 11/23/17 1953 11/24/17 0623  NA 139 142  K 3.6 3.8  CL 106 109  CO2 26 27  GLUCOSE 123* 95  BUN 32* 32*  CREATININE 2.33* 2.44*  CALCIUM 8.4* 8.2*    GFR: Estimated Creatinine Clearance: 20.2 mL/min (A) (by C-G formula based on SCr of 2.44 mg/dL (H)).  Liver Function Tests: Recent Labs  Lab 11/23/17 1953  AST 19  ALT 7*  ALKPHOS 46  BILITOT 0.6  PROT 5.7*  ALBUMIN 3.5     Coagulation Profile: Recent Labs  Lab 11/23/17 1830  INR 1.27      Radiology Studies: Dg Chest Port 1 View  Result Date: 11/24/2017 CLINICAL DATA:  83 year old male with history of shortness of breath. EXAM: PORTABLE CHEST 1 VIEW COMPARISON:  Chest x-ray 09/03/2017. FINDINGS: New area of airspace consolidation in the right mid to lower lung, not obscuring the right hemidiaphragm, potentially within either the right middle or lower lobe. No pleural effusions. Cephalization of pulmonary vasculature, without frank pulmonary edema. Mild diffuse peribronchial cuffing. Heart size is upper limits of normal. Aortic atherosclerosis. IMPRESSION: 1. Diffuse bronchial wall thickening concerning for an acute bronchitis. In addition, there is  a new area of apparent airspace consolidation which could be either within the right middle lobe or right lower lobe, concerning for developing pneumonia. Followup PA and lateral chest X-ray is recommended in 3-4 weeks following trial of antibiotic therapy to ensure resolution and exclude underlying malignancy. 2. Aortic atherosclerosis. Electronically Signed   By: Vinnie Langton M.D.   On: 11/24/2017 11:12     Medications:  Scheduled: . Carbidopa-Levodopa ER  0.5 tablet Oral BID  . docusate sodium  100  mg Oral Daily  . donepezil  5 mg Oral QHS  . fentaNYL      . hydrALAZINE  25 mg Oral TID  . hydrochlorothiazide  12.5 mg Oral Daily  . labetalol  100 mg Oral BID  . lidocaine      . midazolam      . prednisoLONE acetate  1 drop Both Eyes BID  . pregabalin  50 mg Oral QHS   Continuous:  ZOX:WRUEAVWU (SUBLIMAZE) injection, hydrALAZINE, iodixanol, ondansetron (ZOFRAN) IV, oxyCODONE **OR** oxyCODONE  Assessment/Plan:  Active Problems:   Pressure injury of skin   Leaking abdominal aortic aneurysm (AAA) ([place])    Hypoxia Patient noted to be hypoxic after he returned from his procedure.  He was evaluated at bedside.  He does not appear to be in any distress.  His daughter feels that he is very pale.  Blood pressure is in the 981 systolic.  Heart rate is normal.  O2 Saturations were in the mid 80s. After oxygen was applied it has come up to the 90s.  Chest x-ray done earlier today raised concern for consolidation in the right lower lobe.  However patient does not have any symptoms suggestive of pneumonia.  He does have lower extremity edema.  He did get IV fluids overnight to minimize contrast nephropathy.  I believe patient is fluid overloaded.  We will stop his IV fluids.  Give him 1 dose of Lasix and monitor for response.  Since he does not exhibit any signs or symptoms suggestive of pneumonia we will hold off on antibiotics.  Daughter was reassured.  History of AAA with concern for endoleak Seen by interventional radiology on today.  Did not show any endoleak which is reassuring.  Discussed with Dr. Laurence Ferrari who said that he had to give very small amount of contrast so hopefully this will not worsen his renal function.  History of essential hypertension Monitor blood pressures closely.  Continue home medication.  Chronic kidney disease stage IV Baseline creatinine around 2.4-2.8.  As mentioned above he appears to be somewhat fluid overloaded.  He will be given Lasix.  Repeat labs  tomorrow.  Monitor urine output.  Check weight.  Followed by nephrologist in [facility].  History of renal cell carcinoma with solitary kidney As above. Monitor urine output.  Anemia likely due to chronic kidney disease Hemoglobin stable.  No evidence of overt bleeding  Thrombocytopenia Previous labs reviewed and he has chronic thrombocytopenia.  Etiology is unclear.  Continue to monitor.   DVT Prophylaxis: SCDs    Code Status: Full code Family Communication: Discussed with patient's daughter Disposition Plan: Management as outlined above    LOS: 1 day   [facility] Pager [phone] 11/24/2017, 2:30 PM  If 7PM-7AM, please contact night-coverage at [email or web address], password [facility]

## 2017-11-24 NOTE — Anesthesia Procedure Notes (Signed)
Arterial Line Insertion Start/End11/29/2018 10:34 AM, 11/24/2017 10:40 AM Performed by: attending, CRNA  Patient location: Pre-op. Lidocaine 1% used for infiltration Left, radial was placed Catheter size: 20 G  Attempts: 1 Procedure performed without using ultrasound guided technique. Following insertion, dressing applied and Biopatch. Post procedure assessment: unchanged  Patient tolerated the procedure well with no immediate complications.

## 2017-11-25 ENCOUNTER — Inpatient Hospital Stay (HOSPITAL_COMMUNITY): Payer: Medicare Other

## 2017-11-25 DIAGNOSIS — Z862 Personal history of diseases of the blood and blood-forming organs and certain disorders involving the immune mechanism: Secondary | ICD-10-CM

## 2017-11-25 DIAGNOSIS — J810 Acute pulmonary edema: Secondary | ICD-10-CM

## 2017-11-25 DIAGNOSIS — J9811 Atelectasis: Secondary | ICD-10-CM

## 2017-11-25 DIAGNOSIS — R0902 Hypoxemia: Secondary | ICD-10-CM

## 2017-11-25 DIAGNOSIS — J9621 Acute and chronic respiratory failure with hypoxia: Secondary | ICD-10-CM

## 2017-11-25 DIAGNOSIS — J9809 Other diseases of bronchus, not elsewhere classified: Secondary | ICD-10-CM

## 2017-11-25 LAB — BASIC METABOLIC PANEL
ANION GAP: 7 (ref 5–15)
BUN: 37 mg/dL — ABNORMAL HIGH (ref 6–20)
CO2: 25 mmol/L (ref 22–32)
Calcium: 8.3 mg/dL — ABNORMAL LOW (ref 8.9–10.3)
Chloride: 108 mmol/L (ref 101–111)
Creatinine, Ser: 2.67 mg/dL — ABNORMAL HIGH (ref 0.61–1.24)
GFR calc Af Amer: 23 mL/min — ABNORMAL LOW (ref >60)
GFR, EST NON AFRICAN AMERICAN: 20 mL/min — AB (ref >60)
Glucose, Bld: 103 mg/dL — ABNORMAL HIGH (ref 65–99)
POTASSIUM: 3.8 mmol/L (ref 3.5–5.1)
SODIUM: 140 mmol/L (ref 135–145)

## 2017-11-25 LAB — CBC
HCT: 28 % — ABNORMAL LOW (ref 39.0–52.0)
Hemoglobin: 8.8 g/dL — ABNORMAL LOW (ref 13.0–17.0)
MCH: 30.4 pg (ref 26.0–34.0)
MCHC: 31.4 g/dL (ref 30.0–36.0)
MCV: 96.9 fL (ref 78.0–100.0)
PLATELETS: 82 10*3/uL — AB (ref 150–400)
RBC: 2.89 MIL/uL — AB (ref 4.22–5.81)
RDW: 14.8 % (ref 11.5–15.5)
WBC: 4.7 10*3/uL (ref 4.0–10.5)

## 2017-11-25 MED ORDER — ACETYLCYSTEINE 20 % IN SOLN
4.0000 mL | RESPIRATORY_TRACT | Status: AC
  Administered 2017-11-25 – 2017-11-26 (×4): 4 mL via RESPIRATORY_TRACT
  Filled 2017-11-25 (×5): qty 4

## 2017-11-25 MED ORDER — ALBUTEROL SULFATE (2.5 MG/3ML) 0.083% IN NEBU
2.5000 mg | INHALATION_SOLUTION | RESPIRATORY_TRACT | Status: DC | PRN
  Administered 2017-11-25 – 2017-11-29 (×7): 2.5 mg via RESPIRATORY_TRACT
  Filled 2017-11-25 (×8): qty 3

## 2017-11-25 MED ORDER — ACETYLCYSTEINE 10 % IN SOLN
4.0000 mL | RESPIRATORY_TRACT | Status: DC
  Filled 2017-11-25 (×2): qty 4

## 2017-11-25 MED ORDER — HYDRALAZINE HCL 25 MG PO TABS
25.0000 mg | ORAL_TABLET | Freq: Three times a day (TID) | ORAL | Status: DC
  Administered 2017-11-26 – 2017-11-27 (×4): 25 mg via ORAL
  Filled 2017-11-25 (×4): qty 1

## 2017-11-25 NOTE — Care Management Note (Signed)
Case Management Note  Patient Details  Name: [name] MRN: [id number] Date of Birth: 1927/08/28  Subjective/Objective:    CM following for progression and d/c planning.                 Action/Plan: 11/25/2017 Noted PT/OT recommendation for SNF vs 24hr supervision. Await family decision. Will arrange [facility] services if pt is to d/c to home. CSW following for SNF placement if determined to be appropriate level of care. This pt lives alone per hospital record.   Expected Discharge Date:                  Expected Discharge Plan:     In-House Referral:  Clinical Social Work  Discharge planning Services  CM Consult  Post Acute Care Choice:    Choice offered to:     DME Arranged:    DME Agency:     HH Arranged:    HH Agency:     Status of Service:  In process, will continue to follow  If discussed at Long Length of Stay Meetings, dates discussed:    Additional Comments:  Adron Bene, RN 11/25/2017, 2:14 PM

## 2017-11-25 NOTE — Progress Notes (Signed)
[facility] PROGRESS NOTE  [name] YHC:[id number] DOB: 1927/11/17 DOA: 11/23/2017  PCP: Colon Branch, MD  Brief History/Interval Summary: 83 year old Caucasian male with a past medical history of AAA with prior endovascular repair, history of renal cell carcinoma with solitary left kidney, history of hypertension, COPD was sent over from his nephrologist for evaluation of possible AAA endoleak.  Patient was referred to interventional radiology who recommended admission to the hospital.  Patient subsequently developed hypoxia.  Reason for Visit: Acute respiratory failure with hypoxia  Consultants: Interventional radiology  Procedures: Selective angiography which did not reveal any AAA endoleak.  Antibiotics: None  Subjective/Interval History: Patient is very hard of hearing.  His daughter is at the bedside.  He does feel better today compared to yesterday however complained of cough throughout the night.  Unable to produce any expectoration.  Denies any chest pain.  No nausea or vomiting.    ROS: Denies any headaches  Objective:  Vital Signs  Vitals:   11/25/17 0041 11/25/17 0240 11/25/17 0541 11/25/17 1017  BP:   (!) 118/59 (!) 110/45  Pulse:   75 73  Resp:   16 16  Temp: 100.3 F (37.9 C) 99.8 F (37.7 C) 99 F (37.2 C) 98.5 F (36.9 C)  TempSrc: Rectal Rectal Oral Oral  SpO2:   94% 94%  Weight:      Height:        Intake/Output Summary (Last 24 hours) at 11/25/2017 1138 Last data filed at 11/25/2017 0745 Gross per 24 hour  Intake 240 ml  Output 2175 ml  Net -1935 ml   Filed Weights   11/24/17 1020 11/24/17 1400 11/24/17 1918  Weight: 72.6 kg (160 lb) 73.7 kg (162 lb 7.7 oz) 71.2 kg (157 lb)    General appearance: Awake alert.  No distress Resp: Diminished air entry bilaterally.  Crackles at the bases.  No wheezing.  No tachypnea  Cardio: S1-S2 is normal regular.  No S3-S4.  No rubs murmurs or bruit GI: Abdomen is soft.  Nontender  nondistended.  Bowel sounds are normal present.  No masses organomegaly Extremities: Significantly improved edema in the lower extremities Neurologic:  No focal neurological deficit  Lab Results:  Data Reviewed: I have personally reviewed following labs and imaging studies  CBC: Recent Labs  Lab 11/23/17 1953 11/24/17 0623 11/25/17 0501  WBC 3.2* 2.4* 4.7  HGB 9.2* 8.7* 8.8*  HCT 28.3* 27.5* 28.0*  MCV 95.3 97.9 96.9  PLT 80* 80* 82*    Basic Metabolic Panel: Recent Labs  Lab 11/23/17 1953 11/24/17 0623 11/25/17 0501  NA 139 142 140  K 3.6 3.8 3.8  CL 106 109 108  CO2 26 27 25   GLUCOSE 123* 95 103*  BUN 32* 32* 37*  CREATININE 2.33* 2.44* 2.67*  CALCIUM 8.4* 8.2* 8.3*    GFR: Estimated Creatinine Clearance: 18.5 mL/min (A) (by C-G formula based on SCr of 2.67 mg/dL (H)).  Liver Function Tests: Recent Labs  Lab 11/23/17 1953  AST 19  ALT 7*  ALKPHOS 46  BILITOT 0.6  PROT 5.7*  ALBUMIN 3.5     Coagulation Profile: Recent Labs  Lab 11/23/17 1830  INR 1.27      Radiology Studies: Dg Chest 2 View  Result Date: 11/25/2017 CLINICAL DATA:  Cough, shortness of breath. EXAM: CHEST  2 VIEW COMPARISON:  Radiograph of November 24, 2017. FINDINGS: Stable cardiomegaly. Atherosclerosis of thoracic aorta is noted. Significantly increased opacity of the left lung is noted most  consistent with atelectasis and associated pleural effusion. Significantly increased right to left midline shift is noted most consistent with volume loss. Hyperexpansion of the right lung is noted with mild right basilar atelectasis. No pneumothorax is noted. Bony thorax is unremarkable. IMPRESSION: Significant right to left mediastinal shift is noted most likely due to atelectasis and volume loss in the left hemithorax, with possible associated pleural effusion. Mild right basilar subsegmental atelectasis is noted. Electronically Signed   By: Marijo Conception, M.D.   On: 11/25/2017 10:21   Ir  Angiogram Visceral Selective  Result Date: 11/24/2017 INDICATION: 83 year old male with an enlarging infrarenal abdominal aortic aneurysm despite prior endovascular aortic repair. 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 Patient has significant chronic kidney disease and was admitted yesterday for pre-procedure hydration. He presents now for multi selective angiography and attempted endoleak repair. EXAM: SELECTIVE VISCERAL ARTERIOGRAPHY; ADDITIONAL ARTERIOGRAPHY; IR ULTRASOUND GUIDANCE VASC ACCESS RIGHT; PELVIC SELECTIVE ARTERIOGRAPHY MEDICATIONS: None ANESTHESIA/SEDATION: Moderate (conscious) sedation was employed during this procedure. A total of Versed 2 mg and Fentanyl 100 mcg was administered intravenously. Moderate Sedation Time: 115 minutes. The patient's level of consciousness and vital signs were monitored continuously by radiology nursing throughout the procedure under my direct supervision. CONTRAST:  46 mL Visipaque 320 FLUOROSCOPY TIME:  Fluoroscopy Time: 19 minutes 12 seconds (911 mGy). COMPLICATIONS: None immediate. PROCEDURE: Informed consent was obtained from the patient following explanation of the procedure, risks, benefits and alternatives. The patient understands, agrees and consents for the procedure. All questions were addressed. A time out was performed prior to the initiation of the procedure. Maximal barrier sterile technique utilized including caps, mask, sterile gowns, sterile gloves, large sterile drape, hand hygiene, and Betadine prep. The right common femoral artery was interrogated with ultrasound and found to be widely patent. An image was obtained and stored for the medical record. Local anesthesia was attained by infiltration with 1% lidocaine. A small dermatotomy was made. Under real-time sonographic guidance, the vessel was punctured with a 21 gauge micropuncture needle. Using  standard technique, the initial micro needle was exchanged over a 0.018 micro wire for a transitional 4 [place] micro sheath. The micro sheath was then exchanged over a 0.035 wire for a 5 French vascular sheath. A C2 cobra catheter was advanced of the abdominal aorta over a Bentson wire in used to select the celiac artery. A celiac arteriogram was performed. Conventional anatomy. No evidence of replaced middle colloid artery. The C2 catheter was next advanced into the superior mesenteric artery. A superior mesenteric arteriogram was performed using carbon dioxide gas. This resulted in suboptimal visualization. Therefore, arteriography was performed using Visipaque. The middle colloid artery arises proximally from the SMA. There is a visible marginal artery of [place] extending into the left colloid artery and then the IMA. No definite filling of the aneurysm sac. An echelon 14 microcatheter was then successfully navigated over a Fathom 14 wire into the middle colloid artery. Arteriography confirms the catheter position. The catheter was next navigated into the marginal artery of [place]. Arteriography again performed confirming catheter position and anatomy. The artery was then advanced into the left colloid artery. Arteriography again confirms catheter position and anatomy. Still no filling of the aneurysm sac. It is difficult to tell if the IMA is occluded or if there is outflow from the aneurysm sac. Finally, after significant effort, the microcatheter was successfully advanced into the SMA. Arteriography was performed. The  origin of the SMA is occluded. There is a thin channel leading to the aneurysm sac in an filling the Vasa basal rim. There is no endoleak arising from the IMA. The catheter was removed. The C2 cobra catheter was readvanced through the sheath and into the internal iliac artery. An internal iliac arteriogram was performed. The ascending ileo lumbar artery is successfully identified. No evidence  of right-sided lumbar artery endoleak. The C2 catheter was removed. A limited right common femoral arteriogram was performed confirming common femoral arterial access. Hemostasis was attained with the assistance of a 6 French Angio-Seal device. IMPRESSION: 1. 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 No evidence of type 2 endoleak arising from the IMA, or the right-sided lumbar arteries. Signed, Criselda Peaches, MD [facility] Radiology Electronically Signed   By: Jacqulynn Cadet M.D.   On: 11/24/2017 14:29   Ir Angiogram Visceral Selective  Result Date: 11/24/2017 INDICATION: 83 year old male with an enlarging infrarenal abdominal aortic aneurysm despite prior endovascular aortic repair. 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 Patient has significant chronic kidney disease and was admitted yesterday for pre-procedure hydration. He presents now for multi selective angiography and attempted endoleak repair. EXAM: SELECTIVE VISCERAL ARTERIOGRAPHY; ADDITIONAL ARTERIOGRAPHY; IR ULTRASOUND GUIDANCE VASC ACCESS RIGHT; PELVIC SELECTIVE ARTERIOGRAPHY MEDICATIONS: None ANESTHESIA/SEDATION: Moderate (conscious) sedation was employed during this procedure. A total of Versed 2 mg and Fentanyl 100 mcg was administered intravenously. Moderate Sedation Time: 115 minutes. The patient's level of consciousness and vital signs were monitored continuously by radiology nursing throughout the procedure under my direct supervision. CONTRAST:  46 mL Visipaque 320 FLUOROSCOPY TIME:  Fluoroscopy Time: 19 minutes 12 seconds (911 mGy). COMPLICATIONS: None immediate. PROCEDURE: Informed consent was obtained from the patient following explanation of the procedure, risks, benefits and alternatives. The patient understands,  agrees and consents for the procedure. All questions were addressed. A time out was performed prior to the initiation of the procedure. Maximal barrier sterile technique utilized including caps, mask, sterile gowns, sterile gloves, large sterile drape, hand hygiene, and Betadine prep. The right common femoral artery was interrogated with ultrasound and found to be widely patent. An image was obtained and stored for the medical record. Local anesthesia was attained by infiltration with 1% lidocaine. A small dermatotomy was made. Under real-time sonographic guidance, the vessel was punctured with a 21 gauge micropuncture needle. Using standard technique, the initial micro needle was exchanged over a 0.018 micro wire for a transitional 4 [place] micro sheath. The micro sheath was then exchanged over a 0.035 wire for a 5 French vascular sheath. A C2 cobra catheter was advanced of the abdominal aorta over a Bentson wire in used to select the celiac artery. A celiac arteriogram was performed. Conventional anatomy. No evidence of replaced middle colloid artery. The C2 catheter was next advanced into the superior mesenteric artery. A superior mesenteric arteriogram was performed using carbon dioxide gas. This resulted in suboptimal visualization. Therefore, arteriography was performed using Visipaque. The middle colloid artery arises proximally from the SMA. There is a visible marginal artery of [place] extending into the left colloid artery and then the IMA. No definite filling of the aneurysm sac. An echelon 14 microcatheter was then successfully navigated over a Fathom 14 wire into the middle colloid artery. Arteriography confirms the catheter position. The catheter was next navigated into the marginal artery of  [place]. Arteriography again performed confirming catheter position and anatomy. The artery was then advanced into the left colloid artery. Arteriography again confirms catheter position and anatomy. Still no  filling of the aneurysm sac. It is difficult to tell if the IMA is occluded or if there is outflow from the aneurysm sac. Finally, after significant effort, the microcatheter was successfully advanced into the SMA. Arteriography was performed. The origin of the SMA is occluded. There is a thin channel leading to the aneurysm sac in an filling the Vasa basal rim. There is no endoleak arising from the IMA. The catheter was removed. The C2 cobra catheter was readvanced through the sheath and into the internal iliac artery. An internal iliac arteriogram was performed. The ascending ileo lumbar artery is successfully identified. No evidence of right-sided lumbar artery endoleak. The C2 catheter was removed. A limited right common femoral arteriogram was performed confirming common femoral arterial access. Hemostasis was attained with the assistance of a 6 French Angio-Seal device. IMPRESSION: 1. 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 No evidence of type 2 endoleak arising from the IMA, or the right-sided lumbar arteries. Signed, Criselda Peaches, MD [facility] Radiology Electronically Signed   By: Jacqulynn Cadet M.D.   On: 11/24/2017 14:29   Ir Angiogram Pelvis Selective Or Supraselective  Result Date: 11/24/2017 INDICATION: 83 year old male with an enlarging infrarenal abdominal aortic aneurysm despite prior endovascular aortic repair. 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 Patient has significant chronic kidney disease and was admitted yesterday for pre-procedure hydration. He presents now for multi selective angiography and attempted endoleak repair. EXAM: SELECTIVE VISCERAL ARTERIOGRAPHY; ADDITIONAL ARTERIOGRAPHY; IR ULTRASOUND GUIDANCE VASC ACCESS RIGHT; PELVIC SELECTIVE ARTERIOGRAPHY  MEDICATIONS: None ANESTHESIA/SEDATION: Moderate (conscious) sedation was employed during this procedure. A total of Versed 2 mg and Fentanyl 100 mcg was administered intravenously. Moderate Sedation Time: 115 minutes. The patient's level of consciousness and vital signs were monitored continuously by radiology nursing throughout the procedure under my direct supervision. CONTRAST:  46 mL Visipaque 320 FLUOROSCOPY TIME:  Fluoroscopy Time: 19 minutes 12 seconds (911 mGy). COMPLICATIONS: None immediate. PROCEDURE: Informed consent was obtained from the patient following explanation of the procedure, risks, benefits and alternatives. The patient understands, agrees and consents for the procedure. All questions were addressed. A time out was performed prior to the initiation of the procedure. Maximal barrier sterile technique utilized including caps, mask, sterile gowns, sterile gloves, large sterile drape, hand hygiene, and Betadine prep. The right common femoral artery was interrogated with ultrasound and found to be widely patent. An image was obtained and stored for the medical record. Local anesthesia was attained by infiltration with 1% lidocaine. A small dermatotomy was made. Under real-time sonographic guidance, the vessel was punctured with a 21 gauge micropuncture needle. Using standard technique, the initial micro needle was exchanged over a 0.018 micro wire for a transitional 4 [place] micro sheath. The micro sheath was then exchanged over a 0.035 wire for a 5 French vascular sheath. A C2 cobra catheter was advanced of the abdominal aorta over a Bentson wire in used to select the celiac artery. A celiac arteriogram was performed. Conventional anatomy. No evidence of replaced middle colloid artery. The C2 catheter was next advanced into the superior mesenteric artery. A superior mesenteric arteriogram was performed using carbon dioxide gas. This resulted in suboptimal visualization. Therefore, arteriography was  performed using Visipaque. The middle colloid artery arises proximally from the SMA. There is a visible marginal artery of [place] extending into the left colloid artery and then the IMA. No definite filling of the aneurysm sac. An echelon 14 microcatheter was then successfully navigated over a Fathom 14 wire into the middle colloid artery. Arteriography confirms the catheter position. The catheter was next navigated into the marginal artery of [place]. Arteriography again performed confirming catheter position and anatomy. The artery was then advanced into the left colloid artery. Arteriography again confirms catheter position and anatomy. Still no filling of the aneurysm sac. It is difficult to tell if the IMA is occluded or if there is outflow from the aneurysm sac. Finally, after significant effort, the microcatheter was successfully advanced into the SMA. Arteriography was performed. The origin of the SMA is occluded. There is a thin channel leading to the aneurysm sac in an filling the Vasa basal rim. There is no endoleak arising from the IMA. The catheter was removed. The C2 cobra catheter was readvanced through the sheath and into the internal iliac artery. An internal iliac arteriogram was performed. The ascending ileo lumbar artery is successfully identified. No evidence of right-sided lumbar artery endoleak. The C2 catheter was removed. A limited right common femoral arteriogram was performed confirming common femoral arterial access. Hemostasis was attained with the assistance of a 6 French Angio-Seal device. IMPRESSION: 1. 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 No evidence of type 2 endoleak arising from the IMA, or the right-sided lumbar arteries. Signed, Criselda Peaches, MD [facility] Radiology Electronically Signed   By: Jacqulynn Cadet M.D.   On: 11/24/2017 14:29   Ir  Angiogram Selective Each Additional Vessel  Result Date: 11/24/2017 INDICATION: 83 year old male with an enlarging infrarenal abdominal aortic aneurysm despite prior endovascular aortic repair. 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 Patient has significant chronic kidney disease and was admitted yesterday for pre-procedure hydration. He presents now for multi selective angiography and attempted endoleak repair. EXAM: SELECTIVE VISCERAL ARTERIOGRAPHY; ADDITIONAL ARTERIOGRAPHY; IR ULTRASOUND GUIDANCE VASC ACCESS RIGHT; PELVIC SELECTIVE ARTERIOGRAPHY MEDICATIONS: None ANESTHESIA/SEDATION: Moderate (conscious) sedation was employed during this procedure. A total of Versed 2 mg and Fentanyl 100 mcg was administered intravenously. Moderate Sedation Time: 115 minutes. The patient's level of consciousness and vital signs were monitored continuously by radiology nursing throughout the procedure under my direct supervision. CONTRAST:  46 mL Visipaque 320 FLUOROSCOPY TIME:  Fluoroscopy Time: 19 minutes 12 seconds (911 mGy). COMPLICATIONS: None immediate. PROCEDURE: Informed consent was obtained from the patient following explanation of the procedure, risks, benefits and alternatives. The patient understands, agrees and consents for the procedure. All questions were addressed. A time out was performed prior to the initiation of the procedure. Maximal barrier sterile technique utilized including caps, mask, sterile gowns, sterile gloves, large sterile drape, hand hygiene, and Betadine prep. The right common femoral artery was interrogated with ultrasound and found to be widely patent. An image was obtained and stored for the medical record. Local anesthesia was attained by infiltration with 1% lidocaine. A small dermatotomy was made. Under real-time sonographic guidance, the vessel was punctured with a 21 gauge micropuncture  needle. Using standard technique, the initial micro needle was exchanged over a 0.018 micro wire for a transitional 4 [place] micro sheath. The micro sheath was then exchanged over a 0.035 wire  for a 5 [place] vascular sheath. A C2 cobra catheter was advanced of the abdominal aorta over a Bentson wire in used to select the celiac artery. A celiac arteriogram was performed. Conventional anatomy. No evidence of replaced middle colloid artery. The C2 catheter was next advanced into the superior mesenteric artery. A superior mesenteric arteriogram was performed using carbon dioxide gas. This resulted in suboptimal visualization. Therefore, arteriography was performed using Visipaque. The middle colloid artery arises proximally from the SMA. There is a visible marginal artery of [place] extending into the left colloid artery and then the IMA. No definite filling of the aneurysm sac. An echelon 14 microcatheter was then successfully navigated over a Fathom 14 wire into the middle colloid artery. Arteriography confirms the catheter position. The catheter was next navigated into the marginal artery of [place]. Arteriography again performed confirming catheter position and anatomy. The artery was then advanced into the left colloid artery. Arteriography again confirms catheter position and anatomy. Still no filling of the aneurysm sac. It is difficult to tell if the IMA is occluded or if there is outflow from the aneurysm sac. Finally, after significant effort, the microcatheter was successfully advanced into the SMA. Arteriography was performed. The origin of the SMA is occluded. There is a thin channel leading to the aneurysm sac in an filling the Vasa basal rim. There is no endoleak arising from the IMA. The catheter was removed. The C2 cobra catheter was readvanced through the sheath and into the internal iliac artery. An internal iliac arteriogram was performed. The ascending ileo lumbar artery is successfully  identified. No evidence of right-sided lumbar artery endoleak. The C2 catheter was removed. A limited right common femoral arteriogram was performed confirming common femoral arterial access. Hemostasis was attained with the assistance of a 6 French Angio-Seal device. IMPRESSION: 1. 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 No evidence of type 2 endoleak arising from the IMA, or the right-sided lumbar arteries. Signed, Criselda Peaches, MD [facility] Radiology Electronically Signed   By: Jacqulynn Cadet M.D.   On: 11/24/2017 14:29   Ir Angiogram Selective Each Additional Vessel  Result Date: 11/24/2017 INDICATION: 83 year old male with an enlarging infrarenal abdominal aortic aneurysm despite prior endovascular aortic repair. 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 Patient has significant chronic kidney disease and was admitted yesterday for pre-procedure hydration. He presents now for multi selective angiography and attempted endoleak repair. EXAM: SELECTIVE VISCERAL ARTERIOGRAPHY; ADDITIONAL ARTERIOGRAPHY; IR ULTRASOUND GUIDANCE VASC ACCESS RIGHT; PELVIC SELECTIVE ARTERIOGRAPHY MEDICATIONS: None ANESTHESIA/SEDATION: Moderate (conscious) sedation was employed during this procedure. A total of Versed 2 mg and Fentanyl 100 mcg was administered intravenously. Moderate Sedation Time: 115 minutes. The patient's level of consciousness and vital signs were monitored continuously by radiology nursing throughout the procedure under my direct supervision. CONTRAST:  46 mL Visipaque 320 FLUOROSCOPY TIME:  Fluoroscopy Time: 19 minutes 12 seconds (911 mGy). COMPLICATIONS: None immediate. PROCEDURE: Informed consent was obtained from the patient following explanation of the procedure, risks, benefits and  alternatives. The patient understands, agrees and consents for the procedure. All questions were addressed. A time out was performed prior to the initiation of the procedure. Maximal barrier sterile technique utilized including caps, mask, sterile gowns, sterile gloves, large sterile drape, hand hygiene, and Betadine prep. The right common femoral artery was interrogated with ultrasound and found to be widely  patent. An image was obtained and stored for the medical record. Local anesthesia was attained by infiltration with 1% lidocaine. A small dermatotomy was made. Under real-time sonographic guidance, the vessel was punctured with a 21 gauge micropuncture needle. Using standard technique, the initial micro needle was exchanged over a 0.018 micro wire for a transitional 4 [place] micro sheath. The micro sheath was then exchanged over a 0.035 wire for a 5 French vascular sheath. A C2 cobra catheter was advanced of the abdominal aorta over a Bentson wire in used to select the celiac artery. A celiac arteriogram was performed. Conventional anatomy. No evidence of replaced middle colloid artery. The C2 catheter was next advanced into the superior mesenteric artery. A superior mesenteric arteriogram was performed using carbon dioxide gas. This resulted in suboptimal visualization. Therefore, arteriography was performed using Visipaque. The middle colloid artery arises proximally from the SMA. There is a visible marginal artery of [place] extending into the left colloid artery and then the IMA. No definite filling of the aneurysm sac. An echelon 14 microcatheter was then successfully navigated over a Fathom 14 wire into the middle colloid artery. Arteriography confirms the catheter position. The catheter was next navigated into the marginal artery of [place]. Arteriography again performed confirming catheter position and anatomy. The artery was then advanced into the left colloid artery. Arteriography again confirms  catheter position and anatomy. Still no filling of the aneurysm sac. It is difficult to tell if the IMA is occluded or if there is outflow from the aneurysm sac. Finally, after significant effort, the microcatheter was successfully advanced into the SMA. Arteriography was performed. The origin of the SMA is occluded. There is a thin channel leading to the aneurysm sac in an filling the Vasa basal rim. There is no endoleak arising from the IMA. The catheter was removed. The C2 cobra catheter was readvanced through the sheath and into the internal iliac artery. An internal iliac arteriogram was performed. The ascending ileo lumbar artery is successfully identified. No evidence of right-sided lumbar artery endoleak. The C2 catheter was removed. A limited right common femoral arteriogram was performed confirming common femoral arterial access. Hemostasis was attained with the assistance of a 6 French Angio-Seal device. IMPRESSION: 1. 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 No evidence of type 2 endoleak arising from the IMA, or the right-sided lumbar arteries. Signed, Criselda Peaches, MD [facility] Radiology Electronically Signed   By: Jacqulynn Cadet M.D.   On: 11/24/2017 14:29   Ir Angiogram Selective Each Additional Vessel  Result Date: 11/24/2017 INDICATION: 83 year old male with an enlarging infrarenal abdominal aortic aneurysm despite prior endovascular aortic repair. 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 Patient has significant chronic kidney disease and was admitted yesterday for pre-procedure hydration. He presents now for multi selective angiography and attempted endoleak repair. EXAM: SELECTIVE VISCERAL ARTERIOGRAPHY; ADDITIONAL ARTERIOGRAPHY; IR ULTRASOUND GUIDANCE VASC ACCESS RIGHT;  PELVIC SELECTIVE ARTERIOGRAPHY MEDICATIONS: None ANESTHESIA/SEDATION: Moderate (conscious) sedation was employed during this procedure. A total of Versed 2 mg and Fentanyl 100 mcg was administered intravenously. Moderate Sedation Time: 115 minutes. The patient's level of consciousness and vital signs were monitored continuously by radiology nursing throughout the procedure under my direct supervision. CONTRAST:  46 mL Visipaque 320 FLUOROSCOPY TIME:  Fluoroscopy Time: 19 minutes 12 seconds (911 mGy). COMPLICATIONS: None immediate. PROCEDURE: Informed consent was obtained from  the patient following explanation of the procedure, risks, benefits and alternatives. The patient understands, agrees and consents for the procedure. All questions were addressed. A time out was performed prior to the initiation of the procedure. Maximal barrier sterile technique utilized including caps, mask, sterile gowns, sterile gloves, large sterile drape, hand hygiene, and Betadine prep. The right common femoral artery was interrogated with ultrasound and found to be widely patent. An image was obtained and stored for the medical record. Local anesthesia was attained by infiltration with 1% lidocaine. A small dermatotomy was made. Under real-time sonographic guidance, the vessel was punctured with a 21 gauge micropuncture needle. Using standard technique, the initial micro needle was exchanged over a 0.018 micro wire for a transitional 4 [place] micro sheath. The micro sheath was then exchanged over a 0.035 wire for a 5 French vascular sheath. A C2 cobra catheter was advanced of the abdominal aorta over a Bentson wire in used to select the celiac artery. A celiac arteriogram was performed. Conventional anatomy. No evidence of replaced middle colloid artery. The C2 catheter was next advanced into the superior mesenteric artery. A superior mesenteric arteriogram was performed using carbon dioxide gas. This resulted in suboptimal  visualization. Therefore, arteriography was performed using Visipaque. The middle colloid artery arises proximally from the SMA. There is a visible marginal artery of [place] extending into the left colloid artery and then the IMA. No definite filling of the aneurysm sac. An echelon 14 microcatheter was then successfully navigated over a Fathom 14 wire into the middle colloid artery. Arteriography confirms the catheter position. The catheter was next navigated into the marginal artery of [place]. Arteriography again performed confirming catheter position and anatomy. The artery was then advanced into the left colloid artery. Arteriography again confirms catheter position and anatomy. Still no filling of the aneurysm sac. It is difficult to tell if the IMA is occluded or if there is outflow from the aneurysm sac. Finally, after significant effort, the microcatheter was successfully advanced into the SMA. Arteriography was performed. The origin of the SMA is occluded. There is a thin channel leading to the aneurysm sac in an filling the Vasa basal rim. There is no endoleak arising from the IMA. The catheter was removed. The C2 cobra catheter was readvanced through the sheath and into the internal iliac artery. An internal iliac arteriogram was performed. The ascending ileo lumbar artery is successfully identified. No evidence of right-sided lumbar artery endoleak. The C2 catheter was removed. A limited right common femoral arteriogram was performed confirming common femoral arterial access. Hemostasis was attained with the assistance of a 6 French Angio-Seal device. IMPRESSION: 1. 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 No evidence of type 2 endoleak arising from the IMA, or the right-sided lumbar arteries. Signed, Criselda Peaches, MD [facility] Radiology Electronically Signed   By: Jacqulynn Cadet M.D.   On: 11/24/2017 14:29   Ir Angiogram Selective Each Additional Vessel  Result Date: 11/24/2017 INDICATION: 83 year old male with an enlarging infrarenal abdominal aortic aneurysm despite prior endovascular aortic repair. 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 Patient has significant chronic kidney disease and was admitted yesterday for pre-procedure hydration. He presents now for multi selective angiography and attempted endoleak repair. EXAM: SELECTIVE VISCERAL ARTERIOGRAPHY; ADDITIONAL ARTERIOGRAPHY; IR ULTRASOUND GUIDANCE VASC ACCESS RIGHT; PELVIC SELECTIVE ARTERIOGRAPHY MEDICATIONS: None ANESTHESIA/SEDATION: Moderate (conscious) sedation  was employed during this procedure. A total of Versed 2 mg and Fentanyl 100 mcg was administered intravenously. Moderate Sedation Time: 115 minutes. The patient's level of consciousness and vital signs were monitored continuously by radiology nursing throughout the procedure under my direct supervision. CONTRAST:  46 mL Visipaque 320 FLUOROSCOPY TIME:  Fluoroscopy Time: 19 minutes 12 seconds (911 mGy). COMPLICATIONS: None immediate. PROCEDURE: Informed consent was obtained from the patient following explanation of the procedure, risks, benefits and alternatives. The patient understands, agrees and consents for the procedure. All questions were addressed. A time out was performed prior to the initiation of the procedure. Maximal barrier sterile technique utilized including caps, mask, sterile gowns, sterile gloves, large sterile drape, hand hygiene, and Betadine prep. The right common femoral artery was interrogated with ultrasound and found to be widely patent. An image was obtained and stored for the medical record. Local anesthesia was attained by infiltration with 1% lidocaine. A small dermatotomy was made. Under real-time sonographic guidance, the vessel  was punctured with a 21 gauge micropuncture needle. Using standard technique, the initial micro needle was exchanged over a 0.018 micro wire for a transitional 4 [place] micro sheath. The micro sheath was then exchanged over a 0.035 wire for a 5 French vascular sheath. A C2 cobra catheter was advanced of the abdominal aorta over a Bentson wire in used to select the celiac artery. A celiac arteriogram was performed. Conventional anatomy. No evidence of replaced middle colloid artery. The C2 catheter was next advanced into the superior mesenteric artery. A superior mesenteric arteriogram was performed using carbon dioxide gas. This resulted in suboptimal visualization. Therefore, arteriography was performed using Visipaque. The middle colloid artery arises proximally from the SMA. There is a visible marginal artery of [place] extending into the left colloid artery and then the IMA. No definite filling of the aneurysm sac. An echelon 14 microcatheter was then successfully navigated over a Fathom 14 wire into the middle colloid artery. Arteriography confirms the catheter position. The catheter was next navigated into the marginal artery of [place]. Arteriography again performed confirming catheter position and anatomy. The artery was then advanced into the left colloid artery. Arteriography again confirms catheter position and anatomy. Still no filling of the aneurysm sac. It is difficult to tell if the IMA is occluded or if there is outflow from the aneurysm sac. Finally, after significant effort, the microcatheter was successfully advanced into the SMA. Arteriography was performed. The origin of the SMA is occluded. There is a thin channel leading to the aneurysm sac in an filling the Vasa basal rim. There is no endoleak arising from the IMA. The catheter was removed. The C2 cobra catheter was readvanced through the sheath and into the internal iliac artery. An internal iliac arteriogram was performed. The ascending  ileo lumbar artery is successfully identified. No evidence of right-sided lumbar artery endoleak. The C2 catheter was removed. A limited right common femoral arteriogram was performed confirming common femoral arterial access. Hemostasis was attained with the assistance of a 6 French Angio-Seal device. IMPRESSION: 1. 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 No evidence of type 2 endoleak arising from the IMA, or the right-sided lumbar arteries. Signed, Criselda Peaches, MD [facility] Radiology Electronically Signed   By: Jacqulynn Cadet M.D.   On: 11/24/2017 14:29   Ir US Guide Vasc Access Right  Result Date: 11/24/2017 INDICATION: 83 year old male with an enlarging infrarenal abdominal aortic aneurysm despite  prior endovascular aortic repair. 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 Patient has significant chronic kidney disease and was admitted yesterday for pre-procedure hydration. He presents now for multi selective angiography and attempted endoleak repair. EXAM: SELECTIVE VISCERAL ARTERIOGRAPHY; ADDITIONAL ARTERIOGRAPHY; IR ULTRASOUND GUIDANCE VASC ACCESS RIGHT; PELVIC SELECTIVE ARTERIOGRAPHY MEDICATIONS: None ANESTHESIA/SEDATION: Moderate (conscious) sedation was employed during this procedure. A total of Versed 2 mg and Fentanyl 100 mcg was administered intravenously. Moderate Sedation Time: 115 minutes. The patient's level of consciousness and vital signs were monitored continuously by radiology nursing throughout the procedure under my direct supervision. CONTRAST:  46 mL Visipaque 320 FLUOROSCOPY TIME:  Fluoroscopy Time: 19 minutes 12 seconds (911 mGy). COMPLICATIONS: None immediate. PROCEDURE: Informed consent was obtained from the patient following explanation of the procedure,  risks, benefits and alternatives. The patient understands, agrees and consents for the procedure. All questions were addressed. A time out was performed prior to the initiation of the procedure. Maximal barrier sterile technique utilized including caps, mask, sterile gowns, sterile gloves, large sterile drape, hand hygiene, and Betadine prep. The right common femoral artery was interrogated with ultrasound and found to be widely patent. An image was obtained and stored for the medical record. Local anesthesia was attained by infiltration with 1% lidocaine. A small dermatotomy was made. Under real-time sonographic guidance, the vessel was punctured with a 21 gauge micropuncture needle. Using standard technique, the initial micro needle was exchanged over a 0.018 micro wire for a transitional 4 [place] micro sheath. The micro sheath was then exchanged over a 0.035 wire for a 5 French vascular sheath. A C2 cobra catheter was advanced of the abdominal aorta over a Bentson wire in used to select the celiac artery. A celiac arteriogram was performed. Conventional anatomy. No evidence of replaced middle colloid artery. The C2 catheter was next advanced into the superior mesenteric artery. A superior mesenteric arteriogram was performed using carbon dioxide gas. This resulted in suboptimal visualization. Therefore, arteriography was performed using Visipaque. The middle colloid artery arises proximally from the SMA. There is a visible marginal artery of [place] extending into the left colloid artery and then the IMA. No definite filling of the aneurysm sac. An echelon 14 microcatheter was then successfully navigated over a Fathom 14 wire into the middle colloid artery. Arteriography confirms the catheter position. The catheter was next navigated into the marginal artery of [place]. Arteriography again performed confirming catheter position and anatomy. The artery was then advanced into the left colloid artery.  Arteriography again confirms catheter position and anatomy. Still no filling of the aneurysm sac. It is difficult to tell if the IMA is occluded or if there is outflow from the aneurysm sac. Finally, after significant effort, the microcatheter was successfully advanced into the SMA. Arteriography was performed. The origin of the SMA is occluded. There is a thin channel leading to the aneurysm sac in an filling the Vasa basal rim. There is no endoleak arising from the IMA. The catheter was removed. The C2 cobra catheter was readvanced through the sheath and into the internal iliac artery. An internal iliac arteriogram was performed. The ascending ileo lumbar artery is successfully identified. No evidence of right-sided lumbar artery endoleak. The C2 catheter was removed. A limited right common femoral arteriogram was performed confirming common femoral arterial access. Hemostasis was attained with the assistance of a 6 French Angio-Seal device. IMPRESSION: 1. Successful multi selective angiography demonstrates an occluded  origin of the inferior mesenteric artery. No evidence of type 2 endoleak arising from the IMA, or the right-sided lumbar arteries. Signed, Criselda Peaches, MD [facility] Radiology Electronically Signed   By: Jacqulynn Cadet M.D.   On: 11/24/2017 14:29   Dg Chest Port 1 View  Result Date: 11/24/2017 CLINICAL DATA:  83 year old male with history of shortness of breath. EXAM: PORTABLE CHEST 1 VIEW COMPARISON:  Chest x-ray 09/03/2017. FINDINGS: New area of airspace consolidation in the right mid to lower lung, not obscuring the right hemidiaphragm, potentially within either the right middle or lower lobe. No pleural effusions. Cephalization of pulmonary vasculature, without frank pulmonary edema. Mild diffuse peribronchial cuffing. Heart size is upper limits of normal. Aortic atherosclerosis. IMPRESSION: 1. Diffuse bronchial wall thickening  concerning for an acute bronchitis. In addition, there is a new area of apparent airspace consolidation which could be either within the right middle lobe or right lower lobe, concerning for developing pneumonia. Followup PA and lateral chest X-ray is recommended in 3-4 weeks following trial of antibiotic therapy to ensure resolution and exclude underlying malignancy. 2. Aortic atherosclerosis. Electronically Signed   By: Vinnie Langton M.D.   On: 11/24/2017 11:12     Medications:  Scheduled: . Carbidopa-Levodopa ER  0.5 tablet Oral BID  . docusate sodium  100 mg Oral Daily  . donepezil  5 mg Oral QHS  . [START ON 11/26/2017] hydrALAZINE  25 mg Oral TID  . labetalol  100 mg Oral BID  . prednisoLONE acetate  1 drop Both Eyes BID  . pregabalin  50 mg Oral QHS   Continuous:  BTD:[id number] (SUBLIMAZE) injection, hydrALAZINE, iodixanol, ondansetron (ZOFRAN) IV, oxyCODONE **OR** oxyCODONE  Assessment/Plan:  Active Problems:   Pressure injury of skin   Leaking abdominal aortic aneurysm (AAA) (HCC)    Acute respiratory failure with hypoxia Patient developed hypoxia of yesterday around the time of his procedure.  His saturations were in the mid 80s.  There was suspicion for fluid overload as the patient did have edema and he got fluids throughout the night.  Fluids were stopped and he was given high-dose Lasix.  He diuresed well.  Symptomatically has improved.  However he had coughing episodes last night.  Chest x-ray repeated today and shows significant new changes including right to left mediastinal shift with volume loss in the left lung possible atelectasis versus pleural effusion.  Considering the sudden change in 24 hrs this is most likely atelectasis/mucus plugging.  Discussed with pulmonology will see the patient.  Hold off on further doses of Lasix.  Interestingly the consolidation noted in the right lower lung on the initial x-ray is not seen on today's film.  We will continue to hold off  on antibiotics.  History of AAA with concern for endoleak Seen by interventional radiology and underwent arteriography.  Did not show any endoleak which is reassuring.  Discussed with Dr. Laurence Ferrari who said that he had to give very small amount of contrast so hopefully this will not worsen his renal function.  History of essential hypertension Monitor blood pressures closely.  Continue home medication.  Chronic kidney disease stage IV Baseline creatinine around 2.4-2.8.  Patient given high-dose Lasix yesterday.  Creatinine slightly higher today.  Hold off on further doses of diuretics for now.  Weight has reduced.  Continue to monitor clinically.  Check labs tomorrow.  Check weight.  Followed by nephrologist in [facility].  History of renal cell carcinoma with solitary kidney  As above. Monitor urine output.  Anemia likely due to chronic kidney disease Hemoglobin stable.  No evidence of overt bleeding  Thrombocytopenia Previous labs reviewed and he has chronic thrombocytopenia.  Etiology is unclear.  Counts are stable.  Continue to monitor.   DVT Prophylaxis: SCDs    Code Status: Full code Family Communication: Discussed with patient's daughter Disposition Plan: Management as outlined above    LOS: 2 days   Bonnielee Haff  [facility] Pager [phone] 11/25/2017, 11:38 AM  If 7PM-7AM, please contact night-coverage at [email or web address], password [facility]

## 2017-11-25 NOTE — Consult Note (Signed)
Name: [name] MRN: [id number] DOB: August 09, 1927    ADMISSION DATE:  11/23/2017 CONSULTATION DATE:  11/23/17  REFERRING MD :  [name]  CHIEF COMPLAINT:  SOB   HISTORY OF PRESENT ILLNESS:  [name] is a 83 y.o. male with a PMH as outlined below.  He was admitted on 11/28 as direct admit from nephrology office for possible AAA endoleak repair by IR.  He apparently had a routine check on AAA and was told he had an enlarging aneurysm suspicious for an endoleak (initially had infrarenal aortic aneurysm repair on 10/04/08 by Dr. Trula Slade).  11/29 he was taken to IR for where he was found to have no evidence of endoleak.  Post procedure, he was mildly hypoxic.  He was placed on 1L O2 and had improvement in SpO2 from 80s to 90s.  CXR initially showed some RLL consolidation vs atelectasis.  On 11/30, repeat CXR demonstrated almost complete white out of left lung with mediastinal shift to the left.  Findings most consistent with significant atelectasis.  He remained on 1L O2 and SpO2 remained in high 90s.  PCCM called to see in consultation.   PAST MEDICAL HISTORY :   has a past medical history of Abdominal aortic aneurysm ([place]) (09/2008), Anemia, Arthritis, Bleeding stomach ulcer (1990s), Bleeding ulcer (1997), CKD (chronic kidney disease), stage IV ([place]), COPD (chronic obstructive pulmonary disease) ([place]), Dementia, H/O hyperkalemia, Hypertension, Leukopenia (2/11), Pneumonia (2018 X 2), Renal cell carcinoma, Renal insufficiency, Solitary left kidney, and Spinal stenosis.  has a past surgical history that includes Mohs surgery; Hemorrhoid surgery (1990); Nephrectomy (2009); Abdominal aortic aneurysm repair (2009); IR Radiologist Eval & Mgmt (09/07/2017); Cataract extraction w/ intraocular lens  implant, bilateral (Bilateral); IR Angiogram Selective Each Additional Vessel (11/24/2017); IR Angiogram Visceral Selective (11/24/2017); IR Angiogram Selective Each Additional Vessel (11/24/2017); IR  Angiogram Selective Each Additional Vessel (11/24/2017); IR US Guide Vasc Access Right (11/24/2017); IR Angiogram Pelvis Selective Or Supraselective (11/24/2017); IR Angiogram Visceral Selective (11/24/2017); and IR Angiogram Selective Each Additional Vessel (11/24/2017). Prior to Admission medications   Medication Sig Start Date End Date Taking? Authorizing Provider  Carbidopa-Levodopa ER (SINEMET CR) 25-100 MG tablet controlled release Take 0.5 tablets by mouth 2 (two) times daily.   Yes [provider]  Cholecalciferol (VITAMIN D) 2000 units CAPS Take 2,000 Units by mouth daily.   Yes [provider]  docusate sodium (COLACE) 100 MG capsule Take 100 mg by mouth daily.   Yes [provider]  donepezil (ARICEPT) 5 MG tablet Take 5 mg by mouth at bedtime.   Yes [provider]  FeFum-FePo-FA-B Cmp-C-Zn-Mn-Cu (SE-TAN PLUS) 162-115.2-1 MG CAPS Take 1 capsule by mouth daily. Take 1 Tab  daily   Yes [provider]  hydrALAZINE (APRESOLINE) 25 MG tablet Take 25 mg by mouth 3 (three) times daily.   Yes [provider]  hydrochlorothiazide (HYDRODIURIL) 12.5 MG tablet Take 12.5 mg daily by mouth.   Yes [provider]  labetalol (NORMODYNE) 100 MG tablet Take 1 tablet (100 mg total) by mouth 2 (two) times daily. 07/14/15  Yes Paz, Alda Berthold, MD  prednisoLONE acetate (PRED FORTE) 1 % ophthalmic suspension Place 1 drop into both eyes 2 (two) times daily. 11/16/17  Yes [provider]  pregabalin (LYRICA) 50 MG capsule Take 50 mg by mouth at bedtime.    Yes [provider]  sodium polystyrene (KAYEXALATE) 15 GM/60ML suspension Take 15 g by mouth 2 (two) times a week. Take 15 Grams on  Monday and Friday   Yes [provider]   Allergies  Allergen Reactions  . Iodinated Diagnostic Agents Other (See Comments)    Only one kidney   . Ioxaglate Other (See Comments)    Only one kidney   . Baclofen Other (See Comments)     Unable to walk per Pt's family     FAMILY HISTORY:  family history includes Diabetes in his father; Hypertension in his father; Stroke in his father. SOCIAL HISTORY:  reports that he quit smoking about 9 years ago. He has a 90.00 pack-year smoking history. he has never used smokeless tobacco. He reports that he does not drink alcohol or use drugs.  REVIEW OF SYSTEMS:   12 point ROS negative other than above   SUBJECTIVE:   VITAL SIGNS: Temp:  [97.7 F (36.5 C)-100.3 F (37.9 C)] 98.5 F (36.9 C) (11/30 1017) Pulse Rate:  [60-80] 60 (11/30 1144) Resp:  [14-20] 16 (11/30 1017) BP: (110-192)/(45-91) 118/49 (11/30 1144) SpO2:  [91 %-100 %] 95 % (11/30 1144) Weight:  [71.2 kg (157 lb)-73.7 kg (162 lb 7.7 oz)] 71.2 kg (157 lb) (11/29 1918)  PHYSICAL EXAMINATION: General: Chronically ill appearing elderly male, NAD Neuro: Alert and interactive, moving all ext to command HEENT: [place]/AT, PERRL, EOM-I and MMM Cardiovascular: RRR, Nl S1/S2, -M/R/G. Lungs: No BS on the left, decreased on the right Abdomen: Soft, NT, ND and +BS Musculoskeletal: -edema and -tenderness Skin: Intact    Recent Labs  Lab 11/23/17 1953 11/24/17 0623 11/25/17 0501  NA 139 142 140  K 3.6 3.8 3.8  CL 106 109 108  CO2 26 27 25   BUN 32* 32* 37*  CREATININE 2.33* 2.44* 2.67*  GLUCOSE 123* 95 103*   Recent Labs  Lab 11/23/17 1953 11/24/17 0623 11/25/17 0501  HGB 9.2* 8.7* 8.8*  HCT 28.3* 27.5* 28.0*  WBC 3.2* 2.4* 4.7  PLT 80* 80* 82*   Dg Chest 2 View  Result Date: 11/25/2017 CLINICAL DATA:  Cough, shortness of breath. EXAM: CHEST  2 VIEW COMPARISON:  Radiograph of November 24, 2017. FINDINGS: Stable cardiomegaly. Atherosclerosis of thoracic aorta is noted. Significantly increased opacity of the left lung is noted most consistent with atelectasis and associated pleural effusion. Significantly increased right to left midline shift is noted most consistent with volume loss. Hyperexpansion of the  right lung is noted with mild right basilar atelectasis. No pneumothorax is noted. Bony thorax is unremarkable. IMPRESSION: Significant right to left mediastinal shift is noted most likely due to atelectasis and volume loss in the left hemithorax, with possible associated pleural effusion. Mild right basilar subsegmental atelectasis is noted. Electronically Signed   By: Marijo Conception, M.D.   On: 11/25/2017 10:21   Ir Angiogram Visceral Selective  Result Date: 11/24/2017 INDICATION: 84 year old male with an enlarging infrarenal abdominal aortic aneurysm despite prior endovascular aortic repair. 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 Patient has significant chronic kidney disease and was admitted yesterday for pre-procedure hydration. He presents now for multi selective angiography and attempted endoleak repair. EXAM: SELECTIVE VISCERAL ARTERIOGRAPHY; ADDITIONAL ARTERIOGRAPHY; IR ULTRASOUND GUIDANCE VASC ACCESS RIGHT; PELVIC SELECTIVE ARTERIOGRAPHY MEDICATIONS: None ANESTHESIA/SEDATION: Moderate (conscious) sedation was employed during this procedure. A total of Versed 2 mg and Fentanyl 100 mcg was administered intravenously. Moderate Sedation Time: 115 minutes. The patient's level of consciousness and vital signs were monitored continuously by radiology nursing throughout the procedure under my direct  supervision. CONTRAST:  46 mL Visipaque 320 FLUOROSCOPY TIME:  Fluoroscopy Time: 19 minutes 12 seconds (911 mGy). COMPLICATIONS: None immediate. PROCEDURE: Informed consent was obtained from the patient following explanation of the procedure, risks, benefits and alternatives. The patient understands, agrees and consents for the procedure. All questions were addressed. A time out was performed prior to the initiation of the procedure. Maximal barrier sterile technique utilized including caps, mask, sterile gowns,  sterile gloves, large sterile drape, hand hygiene, and Betadine prep. The right common femoral artery was interrogated with ultrasound and found to be widely patent. An image was obtained and stored for the medical record. Local anesthesia was attained by infiltration with 1% lidocaine. A small dermatotomy was made. Under real-time sonographic guidance, the vessel was punctured with a 21 gauge micropuncture needle. Using standard technique, the initial micro needle was exchanged over a 0.018 micro wire for a transitional 4 [place] micro sheath. The micro sheath was then exchanged over a 0.035 wire for a 5 French vascular sheath. A C2 cobra catheter was advanced of the abdominal aorta over a Bentson wire in used to select the celiac artery. A celiac arteriogram was performed. Conventional anatomy. No evidence of replaced middle colloid artery. The C2 catheter was next advanced into the superior mesenteric artery. A superior mesenteric arteriogram was performed using carbon dioxide gas. This resulted in suboptimal visualization. Therefore, arteriography was performed using Visipaque. The middle colloid artery arises proximally from the SMA. There is a visible marginal artery of [place] extending into the left colloid artery and then the IMA. No definite filling of the aneurysm sac. An echelon 14 microcatheter was then successfully navigated over a Fathom 14 wire into the middle colloid artery. Arteriography confirms the catheter position. The catheter was next navigated into the marginal artery of [place]. Arteriography again performed confirming catheter position and anatomy. The artery was then advanced into the left colloid artery. Arteriography again confirms catheter position and anatomy. Still no filling of the aneurysm sac. It is difficult to tell if the IMA is occluded or if there is outflow from the aneurysm sac. Finally, after significant effort, the microcatheter was successfully advanced into the SMA.  Arteriography was performed. The origin of the SMA is occluded. There is a thin channel leading to the aneurysm sac in an filling the Vasa basal rim. There is no endoleak arising from the IMA. The catheter was removed. The C2 cobra catheter was readvanced through the sheath and into the internal iliac artery. An internal iliac arteriogram was performed. The ascending ileo lumbar artery is successfully identified. No evidence of right-sided lumbar artery endoleak. The C2 catheter was removed. A limited right common femoral arteriogram was performed confirming common femoral arterial access. Hemostasis was attained with the assistance of a 6 French Angio-Seal device. IMPRESSION: 1. 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 No evidence of type 2 endoleak arising from the IMA, or the right-sided lumbar arteries. Signed, Criselda Peaches, MD [facility] Radiology Electronically Signed   By: Jacqulynn Cadet M.D.   On: 11/24/2017 14:29   Ir Angiogram Visceral Selective  Result Date: 11/24/2017 INDICATION: 83 year old male with an enlarging infrarenal abdominal aortic aneurysm despite prior endovascular aortic repair. 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 Patient has significant chronic kidney disease and was admitted yesterday for pre-procedure hydration. He presents now for multi selective angiography  and attempted endoleak repair. EXAM: SELECTIVE VISCERAL ARTERIOGRAPHY; ADDITIONAL ARTERIOGRAPHY; IR ULTRASOUND GUIDANCE VASC ACCESS RIGHT; PELVIC SELECTIVE ARTERIOGRAPHY MEDICATIONS: None ANESTHESIA/SEDATION: Moderate (conscious) sedation was employed during this procedure. A total of Versed 2 mg and Fentanyl 100 mcg was administered intravenously. Moderate Sedation Time: 115 minutes. The patient's level  of consciousness and vital signs were monitored continuously by radiology nursing throughout the procedure under my direct supervision. CONTRAST:  46 mL Visipaque 320 FLUOROSCOPY TIME:  Fluoroscopy Time: 19 minutes 12 seconds (911 mGy). COMPLICATIONS: None immediate. PROCEDURE: Informed consent was obtained from the patient following explanation of the procedure, risks, benefits and alternatives. The patient understands, agrees and consents for the procedure. All questions were addressed. A time out was performed prior to the initiation of the procedure. Maximal barrier sterile technique utilized including caps, mask, sterile gowns, sterile gloves, large sterile drape, hand hygiene, and Betadine prep. The right common femoral artery was interrogated with ultrasound and found to be widely patent. An image was obtained and stored for the medical record. Local anesthesia was attained by infiltration with 1% lidocaine. A small dermatotomy was made. Under real-time sonographic guidance, the vessel was punctured with a 21 gauge micropuncture needle. Using standard technique, the initial micro needle was exchanged over a 0.018 micro wire for a transitional 4 [place] micro sheath. The micro sheath was then exchanged over a 0.035 wire for a 5 French vascular sheath. A C2 cobra catheter was advanced of the abdominal aorta over a Bentson wire in used to select the celiac artery. A celiac arteriogram was performed. Conventional anatomy. No evidence of replaced middle colloid artery. The C2 catheter was next advanced into the superior mesenteric artery. A superior mesenteric arteriogram was performed using carbon dioxide gas. This resulted in suboptimal visualization. Therefore, arteriography was performed using Visipaque. The middle colloid artery arises proximally from the SMA. There is a visible marginal artery of [place] extending into the left colloid artery and then the IMA. No definite filling of the aneurysm sac. An  echelon 14 microcatheter was then successfully navigated over a Fathom 14 wire into the middle colloid artery. Arteriography confirms the catheter position. The catheter was next navigated into the marginal artery of [place]. Arteriography again performed confirming catheter position and anatomy. The artery was then advanced into the left colloid artery. Arteriography again confirms catheter position and anatomy. Still no filling of the aneurysm sac. It is difficult to tell if the IMA is occluded or if there is outflow from the aneurysm sac. Finally, after significant effort, the microcatheter was successfully advanced into the SMA. Arteriography was performed. The origin of the SMA is occluded. There is a thin channel leading to the aneurysm sac in an filling the Vasa basal rim. There is no endoleak arising from the IMA. The catheter was removed. The C2 cobra catheter was readvanced through the sheath and into the internal iliac artery. An internal iliac arteriogram was performed. The ascending ileo lumbar artery is successfully identified. No evidence of right-sided lumbar artery endoleak. The C2 catheter was removed. A limited right common femoral arteriogram was performed confirming common femoral arterial access. Hemostasis was attained with the assistance of a 6 French Angio-Seal device. IMPRESSION: 1. 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 No evidence of type 2 endoleak arising from the IMA, or the right-sided lumbar arteries. Signed, Criselda Peaches, MD [facility] Radiology Electronically Signed   By: Jacqulynn Cadet M.D.   On:  11/24/2017 14:29   Ir Angiogram Pelvis Selective Or Supraselective  Result Date: 11/24/2017 INDICATION: 83 year old male with an enlarging infrarenal abdominal aortic aneurysm despite prior endovascular aortic repair. Duplex ultrasound imaging performed in  the and office vascular lab suggested a type 2 endoleak arising from the inferior mesenteric artery. Patient has significant chronic kidney disease and was admitted yesterday for pre-procedure hydration. He presents now for multi selective angiography and attempted endoleak repair. EXAM: SELECTIVE VISCERAL ARTERIOGRAPHY; ADDITIONAL ARTERIOGRAPHY; IR ULTRASOUND GUIDANCE VASC ACCESS RIGHT; PELVIC SELECTIVE ARTERIOGRAPHY MEDICATIONS: None ANESTHESIA/SEDATION: Moderate (conscious) sedation was employed during this procedure. A total of Versed 2 mg and Fentanyl 100 mcg was administered intravenously. Moderate Sedation Time: 115 minutes. The patient's level of consciousness and vital signs were monitored continuously by radiology nursing throughout the procedure under my direct supervision. CONTRAST:  46 mL Visipaque 320 FLUOROSCOPY TIME:  Fluoroscopy Time: 19 minutes 12 seconds (911 mGy). COMPLICATIONS: None immediate. PROCEDURE: Informed consent was obtained from the patient following explanation of the procedure, risks, benefits and alternatives. The patient understands, agrees and consents for the procedure. All questions were addressed. A time out was performed prior to the initiation of the procedure. Maximal barrier sterile technique utilized including caps, mask, sterile gowns, sterile gloves, large sterile drape, hand hygiene, and Betadine prep. The right common femoral artery was interrogated with ultrasound and found to be widely patent. An image was obtained and stored for the medical record. Local anesthesia was attained by infiltration with 1% lidocaine. A small dermatotomy was made. Under real-time sonographic guidance, the vessel was punctured with a 21 gauge micropuncture needle. Using standard technique, the initial micro needle was exchanged over a 0.018 micro wire for a transitional 4 [place] micro sheath. The micro sheath was then exchanged over a 0.035 wire for a 5 French vascular sheath. A C2 cobra  catheter was advanced of the abdominal aorta over a Bentson wire in used to select the celiac artery. A celiac arteriogram was performed. Conventional anatomy. No evidence of replaced middle colloid artery. The C2 catheter was next advanced into the superior mesenteric artery. A superior mesenteric arteriogram was performed using carbon dioxide gas. This resulted in suboptimal visualization. Therefore, arteriography was performed using Visipaque. The middle colloid artery arises proximally from the SMA. There is a visible marginal artery of [place] extending into the left colloid artery and then the IMA. No definite filling of the aneurysm sac. An echelon 14 microcatheter was then successfully navigated over a Fathom 14 wire into the middle colloid artery. Arteriography confirms the catheter position. The catheter was next navigated into the marginal artery of [place]. Arteriography again performed confirming catheter position and anatomy. The artery was then advanced into the left colloid artery. Arteriography again confirms catheter position and anatomy. Still no filling of the aneurysm sac. It is difficult to tell if the IMA is occluded or if there is outflow from the aneurysm sac. Finally, after significant effort, the microcatheter was successfully advanced into the SMA. Arteriography was performed. The origin of the SMA is occluded. There is a thin channel leading to the aneurysm sac in an filling the Vasa basal rim. There is no endoleak arising from the IMA. The catheter was removed. The C2 cobra catheter was readvanced through the sheath and into the internal iliac artery. An internal iliac arteriogram was performed. The ascending ileo lumbar artery is successfully identified. No evidence of right-sided lumbar artery endoleak. The C2 catheter was removed. A limited right common femoral arteriogram was performed confirming  common femoral arterial access. Hemostasis was attained with the assistance of a 6  French Angio-Seal device. IMPRESSION: 1. 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 No evidence of type 2 endoleak arising from the IMA, or the right-sided lumbar arteries. Signed, Criselda Peaches, MD [facility] Radiology Electronically Signed   By: Jacqulynn Cadet M.D.   On: 11/24/2017 14:29   Ir Angiogram Selective Each Additional Vessel  Result Date: 11/24/2017 INDICATION: 83 year old male with an enlarging infrarenal abdominal aortic aneurysm despite prior endovascular aortic repair. 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 Patient has significant chronic kidney disease and was admitted yesterday for pre-procedure hydration. He presents now for multi selective angiography and attempted endoleak repair. EXAM: SELECTIVE VISCERAL ARTERIOGRAPHY; ADDITIONAL ARTERIOGRAPHY; IR ULTRASOUND GUIDANCE VASC ACCESS RIGHT; PELVIC SELECTIVE ARTERIOGRAPHY MEDICATIONS: None ANESTHESIA/SEDATION: Moderate (conscious) sedation was employed during this procedure. A total of Versed 2 mg and Fentanyl 100 mcg was administered intravenously. Moderate Sedation Time: 115 minutes. The patient's level of consciousness and vital signs were monitored continuously by radiology nursing throughout the procedure under my direct supervision. CONTRAST:  46 mL Visipaque 320 FLUOROSCOPY TIME:  Fluoroscopy Time: 19 minutes 12 seconds (911 mGy). COMPLICATIONS: None immediate. PROCEDURE: Informed consent was obtained from the patient following explanation of the procedure, risks, benefits and alternatives. The patient understands, agrees and consents for the procedure. All questions were addressed. A time out was performed prior to the initiation of the procedure. Maximal barrier sterile technique utilized including caps, mask, sterile  gowns, sterile gloves, large sterile drape, hand hygiene, and Betadine prep. The right common femoral artery was interrogated with ultrasound and found to be widely patent. An image was obtained and stored for the medical record. Local anesthesia was attained by infiltration with 1% lidocaine. A small dermatotomy was made. Under real-time sonographic guidance, the vessel was punctured with a 21 gauge micropuncture needle. Using standard technique, the initial micro needle was exchanged over a 0.018 micro wire for a transitional 4 [place] micro sheath. The micro sheath was then exchanged over a 0.035 wire for a 5 French vascular sheath. A C2 cobra catheter was advanced of the abdominal aorta over a Bentson wire in used to select the celiac artery. A celiac arteriogram was performed. Conventional anatomy. No evidence of replaced middle colloid artery. The C2 catheter was next advanced into the superior mesenteric artery. A superior mesenteric arteriogram was performed using carbon dioxide gas. This resulted in suboptimal visualization. Therefore, arteriography was performed using Visipaque. The middle colloid artery arises proximally from the SMA. There is a visible marginal artery of [place] extending into the left colloid artery and then the IMA. No definite filling of the aneurysm sac. An echelon 14 microcatheter was then successfully navigated over a Fathom 14 wire into the middle colloid artery. Arteriography confirms the catheter position. The catheter was next navigated into the marginal artery of [place]. Arteriography again performed confirming catheter position and anatomy. The artery was then advanced into the left colloid artery. Arteriography again confirms catheter position and anatomy. Still no filling of the aneurysm sac. It is difficult to tell if the IMA is occluded or if there is outflow from the aneurysm sac. Finally, after significant effort, the microcatheter was successfully advanced into the  SMA. Arteriography was performed. The origin of the SMA is occluded. There is a thin channel leading to the  aneurysm sac in an filling the Vasa basal rim. There is no endoleak arising from the IMA. The catheter was removed. The C2 cobra catheter was readvanced through the sheath and into the internal iliac artery. An internal iliac arteriogram was performed. The ascending ileo lumbar artery is successfully identified. No evidence of right-sided lumbar artery endoleak. The C2 catheter was removed. A limited right common femoral arteriogram was performed confirming common femoral arterial access. Hemostasis was attained with the assistance of a 6 French Angio-Seal device. IMPRESSION: 1. 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 No evidence of type 2 endoleak arising from the IMA, or the right-sided lumbar arteries. Signed, Criselda Peaches, MD [facility] Radiology Electronically Signed   By: Jacqulynn Cadet M.D.   On: 11/24/2017 14:29   Ir Angiogram Selective Each Additional Vessel  Result Date: 11/24/2017 INDICATION: 83 year old male with an enlarging infrarenal abdominal aortic aneurysm despite prior endovascular aortic repair. 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 Patient has significant chronic kidney disease and was admitted yesterday for pre-procedure hydration. He presents now for multi selective angiography and attempted endoleak repair. EXAM: SELECTIVE VISCERAL ARTERIOGRAPHY; ADDITIONAL ARTERIOGRAPHY; IR ULTRASOUND GUIDANCE VASC ACCESS RIGHT; PELVIC SELECTIVE ARTERIOGRAPHY MEDICATIONS: None ANESTHESIA/SEDATION: Moderate (conscious) sedation was employed during this procedure. A total of Versed 2 mg and Fentanyl 100 mcg was administered intravenously. Moderate Sedation Time: 115 minutes.  The patient's level of consciousness and vital signs were monitored continuously by radiology nursing throughout the procedure under my direct supervision. CONTRAST:  46 mL Visipaque 320 FLUOROSCOPY TIME:  Fluoroscopy Time: 19 minutes 12 seconds (911 mGy). COMPLICATIONS: None immediate. PROCEDURE: Informed consent was obtained from the patient following explanation of the procedure, risks, benefits and alternatives. The patient understands, agrees and consents for the procedure. All questions were addressed. A time out was performed prior to the initiation of the procedure. Maximal barrier sterile technique utilized including caps, mask, sterile gowns, sterile gloves, large sterile drape, hand hygiene, and Betadine prep. The right common femoral artery was interrogated with ultrasound and found to be widely patent. An image was obtained and stored for the medical record. Local anesthesia was attained by infiltration with 1% lidocaine. A small dermatotomy was made. Under real-time sonographic guidance, the vessel was punctured with a 21 gauge micropuncture needle. Using standard technique, the initial micro needle was exchanged over a 0.018 micro wire for a transitional 4 [place] micro sheath. The micro sheath was then exchanged over a 0.035 wire for a 5 French vascular sheath. A C2 cobra catheter was advanced of the abdominal aorta over a Bentson wire in used to select the celiac artery. A celiac arteriogram was performed. Conventional anatomy. No evidence of replaced middle colloid artery. The C2 catheter was next advanced into the superior mesenteric artery. A superior mesenteric arteriogram was performed using carbon dioxide gas. This resulted in suboptimal visualization. Therefore, arteriography was performed using Visipaque. The middle colloid artery arises proximally from the SMA. There is a visible marginal artery of [place] extending into the left colloid artery and then the IMA. No definite filling of the  aneurysm sac. An echelon 14 microcatheter was then successfully navigated over a Fathom 14 wire into the middle colloid artery. Arteriography confirms the catheter position. The catheter was next navigated into the marginal artery of [place]. Arteriography again performed confirming catheter position and anatomy. The artery was  then advanced into the left colloid artery. Arteriography again confirms catheter position and anatomy. Still no filling of the aneurysm sac. It is difficult to tell if the IMA is occluded or if there is outflow from the aneurysm sac. Finally, after significant effort, the microcatheter was successfully advanced into the SMA. Arteriography was performed. The origin of the SMA is occluded. There is a thin channel leading to the aneurysm sac in an filling the Vasa basal rim. There is no endoleak arising from the IMA. The catheter was removed. The C2 cobra catheter was readvanced through the sheath and into the internal iliac artery. An internal iliac arteriogram was performed. The ascending ileo lumbar artery is successfully identified. No evidence of right-sided lumbar artery endoleak. The C2 catheter was removed. A limited right common femoral arteriogram was performed confirming common femoral arterial access. Hemostasis was attained with the assistance of a 6 French Angio-Seal device. IMPRESSION: 1. 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 No evidence of type 2 endoleak arising from the IMA, or the right-sided lumbar arteries. Signed, Criselda Peaches, MD [facility] Radiology Electronically Signed   By: Jacqulynn Cadet M.D.   On: 11/24/2017 14:29   Ir Angiogram Selective Each Additional Vessel  Result Date: 11/24/2017 INDICATION: 83 year old male with an enlarging infrarenal abdominal aortic aneurysm despite prior endovascular aortic repair. 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 Patient has significant chronic kidney disease and was admitted yesterday for pre-procedure hydration. He presents now for multi selective angiography and attempted endoleak repair. EXAM: SELECTIVE VISCERAL ARTERIOGRAPHY; ADDITIONAL ARTERIOGRAPHY; IR ULTRASOUND GUIDANCE VASC ACCESS RIGHT; PELVIC SELECTIVE ARTERIOGRAPHY MEDICATIONS: None ANESTHESIA/SEDATION: Moderate (conscious) sedation was employed during this procedure. A total of Versed 2 mg and Fentanyl 100 mcg was administered intravenously. Moderate Sedation Time: 115 minutes. The patient's level of consciousness and vital signs were monitored continuously by radiology nursing throughout the procedure under my direct supervision. CONTRAST:  46 mL Visipaque 320 FLUOROSCOPY TIME:  Fluoroscopy Time: 19 minutes 12 seconds (911 mGy). COMPLICATIONS: None immediate. PROCEDURE: Informed consent was obtained from the patient following explanation of the procedure, risks, benefits and alternatives. The patient understands, agrees and consents for the procedure. All questions were addressed. A time out was performed prior to the initiation of the procedure. Maximal barrier sterile technique utilized including caps, mask, sterile gowns, sterile gloves, large sterile drape, hand hygiene, and Betadine prep. The right common femoral artery was interrogated with ultrasound and found to be widely patent. An image was obtained and stored for the medical record. Local anesthesia was attained by infiltration with 1% lidocaine. A small dermatotomy was made. Under real-time sonographic guidance, the vessel was punctured with a 21 gauge micropuncture needle. Using standard technique, the initial micro needle was exchanged over a 0.018 micro wire for a transitional 4 [place] micro sheath. The micro sheath was then exchanged over a 0.035 wire for a 5 French vascular sheath.  A C2 cobra catheter was advanced of the abdominal aorta over a Bentson wire in used to select the celiac artery. A celiac arteriogram was performed. Conventional anatomy. No evidence of replaced middle colloid artery. The C2 catheter was next advanced into the superior mesenteric artery. A superior mesenteric arteriogram was performed using carbon dioxide gas. This resulted in suboptimal visualization. Therefore, arteriography was performed using Visipaque. The middle colloid artery arises proximally from the SMA.  There is a visible marginal artery of [place] extending into the left colloid artery and then the IMA. No definite filling of the aneurysm sac. An echelon 14 microcatheter was then successfully navigated over a Fathom 14 wire into the middle colloid artery. Arteriography confirms the catheter position. The catheter was next navigated into the marginal artery of [place]. Arteriography again performed confirming catheter position and anatomy. The artery was then advanced into the left colloid artery. Arteriography again confirms catheter position and anatomy. Still no filling of the aneurysm sac. It is difficult to tell if the IMA is occluded or if there is outflow from the aneurysm sac. Finally, after significant effort, the microcatheter was successfully advanced into the SMA. Arteriography was performed. The origin of the SMA is occluded. There is a thin channel leading to the aneurysm sac in an filling the Vasa basal rim. There is no endoleak arising from the IMA. The catheter was removed. The C2 cobra catheter was readvanced through the sheath and into the internal iliac artery. An internal iliac arteriogram was performed. The ascending ileo lumbar artery is successfully identified. No evidence of right-sided lumbar artery endoleak. The C2 catheter was removed. A limited right common femoral arteriogram was performed confirming common femoral arterial access. Hemostasis was attained with the  assistance of a 6 French Angio-Seal device. IMPRESSION: 1. 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 No evidence of type 2 endoleak arising from the IMA, or the right-sided lumbar arteries. Signed, Criselda Peaches, MD [facility] Radiology Electronically Signed   By: Jacqulynn Cadet M.D.   On: 11/24/2017 14:29   Ir Angiogram Selective Each Additional Vessel  Result Date: 11/24/2017 INDICATION: 83 year old male with an enlarging infrarenal abdominal aortic aneurysm despite prior endovascular aortic repair. 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 Patient has significant chronic kidney disease and was admitted yesterday for pre-procedure hydration. He presents now for multi selective angiography and attempted endoleak repair. EXAM: SELECTIVE VISCERAL ARTERIOGRAPHY; ADDITIONAL ARTERIOGRAPHY; IR ULTRASOUND GUIDANCE VASC ACCESS RIGHT; PELVIC SELECTIVE ARTERIOGRAPHY MEDICATIONS: None ANESTHESIA/SEDATION: Moderate (conscious) sedation was employed during this procedure. A total of Versed 2 mg and Fentanyl 100 mcg was administered intravenously. Moderate Sedation Time: 115 minutes. The patient's level of consciousness and vital signs were monitored continuously by radiology nursing throughout the procedure under my direct supervision. CONTRAST:  46 mL Visipaque 320 FLUOROSCOPY TIME:  Fluoroscopy Time: 19 minutes 12 seconds (911 mGy). COMPLICATIONS: None immediate. PROCEDURE: Informed consent was obtained from the patient following explanation of the procedure, risks, benefits and alternatives. The patient understands, agrees and consents for the procedure. All questions were addressed. A time out was performed prior to the initiation of the procedure. Maximal barrier sterile technique utilized including  caps, mask, sterile gowns, sterile gloves, large sterile drape, hand hygiene, and Betadine prep. The right common femoral artery was interrogated with ultrasound and found to be widely patent. An image was obtained and stored for the medical record. Local anesthesia was attained by infiltration with 1% lidocaine. A small dermatotomy was made. Under real-time sonographic guidance, the vessel was punctured with a 21 gauge micropuncture needle. Using standard technique, the initial micro needle was exchanged over a 0.018 micro wire for a transitional 4 [place] micro sheath. The micro sheath was then exchanged over a 0.035 wire for a 5 French vascular sheath. A C2 cobra catheter was advanced  of the abdominal aorta over a Bentson wire in used to select the celiac artery. A celiac arteriogram was performed. Conventional anatomy. No evidence of replaced middle colloid artery. The C2 catheter was next advanced into the superior mesenteric artery. A superior mesenteric arteriogram was performed using carbon dioxide gas. This resulted in suboptimal visualization. Therefore, arteriography was performed using Visipaque. The middle colloid artery arises proximally from the SMA. There is a visible marginal artery of [place] extending into the left colloid artery and then the IMA. No definite filling of the aneurysm sac. An echelon 14 microcatheter was then successfully navigated over a Fathom 14 wire into the middle colloid artery. Arteriography confirms the catheter position. The catheter was next navigated into the marginal artery of [place]. Arteriography again performed confirming catheter position and anatomy. The artery was then advanced into the left colloid artery. Arteriography again confirms catheter position and anatomy. Still no filling of the aneurysm sac. It is difficult to tell if the IMA is occluded or if there is outflow from the aneurysm sac. Finally, after significant effort, the microcatheter was successfully  advanced into the SMA. Arteriography was performed. The origin of the SMA is occluded. There is a thin channel leading to the aneurysm sac in an filling the Vasa basal rim. There is no endoleak arising from the IMA. The catheter was removed. The C2 cobra catheter was readvanced through the sheath and into the internal iliac artery. An internal iliac arteriogram was performed. The ascending ileo lumbar artery is successfully identified. No evidence of right-sided lumbar artery endoleak. The C2 catheter was removed. A limited right common femoral arteriogram was performed confirming common femoral arterial access. Hemostasis was attained with the assistance of a 6 French Angio-Seal device. IMPRESSION: 1. 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 No evidence of type 2 endoleak arising from the IMA, or the right-sided lumbar arteries. Signed, Criselda Peaches, MD [facility] Radiology Electronically Signed   By: Jacqulynn Cadet M.D.   On: 11/24/2017 14:29   Ir US Guide Vasc Access Right  Result Date: 11/24/2017 INDICATION: 83 year old male with an enlarging infrarenal abdominal aortic aneurysm despite prior endovascular aortic repair. 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 Patient has significant chronic kidney disease and was admitted yesterday for pre-procedure hydration. He presents now for multi selective angiography and attempted endoleak repair. EXAM: SELECTIVE VISCERAL ARTERIOGRAPHY; ADDITIONAL ARTERIOGRAPHY; IR ULTRASOUND GUIDANCE VASC ACCESS RIGHT; PELVIC SELECTIVE ARTERIOGRAPHY MEDICATIONS: None ANESTHESIA/SEDATION: Moderate (conscious) sedation was employed during this procedure. A total of Versed 2 mg and Fentanyl 100 mcg was administered intravenously. Moderate Sedation Time: 115  minutes. The patient's level of consciousness and vital signs were monitored continuously by radiology nursing throughout the procedure under my direct supervision. CONTRAST:  46 mL Visipaque 320 FLUOROSCOPY TIME:  Fluoroscopy Time: 19 minutes 12 seconds (911 mGy). COMPLICATIONS: None immediate. PROCEDURE: Informed consent was obtained from the patient following explanation of the procedure, risks, benefits and alternatives. The patient understands, agrees and consents for the procedure. All questions were addressed. A time out was performed prior to the initiation of the procedure. Maximal barrier sterile technique utilized including caps, mask, sterile gowns, sterile gloves, large sterile drape, hand hygiene, and Betadine prep. The right common femoral artery was interrogated with ultrasound and found to be widely patent. An image was obtained and stored for the medical record. Local  anesthesia was attained by infiltration with 1% lidocaine. A small dermatotomy was made. Under real-time sonographic guidance, the vessel was punctured with a 21 gauge micropuncture needle. Using standard technique, the initial micro needle was exchanged over a 0.018 micro wire for a transitional 4 [place] micro sheath. The micro sheath was then exchanged over a 0.035 wire for a 5 French vascular sheath. A C2 cobra catheter was advanced of the abdominal aorta over a Bentson wire in used to select the celiac artery. A celiac arteriogram was performed. Conventional anatomy. No evidence of replaced middle colloid artery. The C2 catheter was next advanced into the superior mesenteric artery. A superior mesenteric arteriogram was performed using carbon dioxide gas. This resulted in suboptimal visualization. Therefore, arteriography was performed using Visipaque. The middle colloid artery arises proximally from the SMA. There is a visible marginal artery of [place] extending into the left colloid artery and then the IMA. No definite filling  of the aneurysm sac. An echelon 14 microcatheter was then successfully navigated over a Fathom 14 wire into the middle colloid artery. Arteriography confirms the catheter position. The catheter was next navigated into the marginal artery of [place]. Arteriography again performed confirming catheter position and anatomy. The artery was then advanced into the left colloid artery. Arteriography again confirms catheter position and anatomy. Still no filling of the aneurysm sac. It is difficult to tell if the IMA is occluded or if there is outflow from the aneurysm sac. Finally, after significant effort, the microcatheter was successfully advanced into the SMA. Arteriography was performed. The origin of the SMA is occluded. There is a thin channel leading to the aneurysm sac in an filling the Vasa basal rim. There is no endoleak arising from the IMA. The catheter was removed. The C2 cobra catheter was readvanced through the sheath and into the internal iliac artery. An internal iliac arteriogram was performed. The ascending ileo lumbar artery is successfully identified. No evidence of right-sided lumbar artery endoleak. The C2 catheter was removed. A limited right common femoral arteriogram was performed confirming common femoral arterial access. Hemostasis was attained with the assistance of a 6 French Angio-Seal device. IMPRESSION: 1. 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 No evidence of type 2 endoleak arising from the IMA, or the right-sided lumbar arteries. Signed, Criselda Peaches, MD [facility] Radiology Electronically Signed   By: Jacqulynn Cadet M.D.   On: 11/24/2017 14:29   Dg Chest Port 1 View  Result Date: 11/24/2017 CLINICAL DATA:  83 year old male with history of shortness of breath. EXAM: PORTABLE CHEST 1 VIEW COMPARISON:  Chest x-ray 09/03/2017. FINDINGS: New area of airspace  consolidation in the right mid to lower lung, not obscuring the right hemidiaphragm, potentially within either the right middle or lower lobe. No pleural effusions. Cephalization of pulmonary vasculature, without frank pulmonary edema. Mild diffuse peribronchial cuffing. Heart size is upper limits of normal. Aortic atherosclerosis. IMPRESSION: 1. Diffuse bronchial wall thickening concerning for an acute bronchitis. In addition, there is a new area of apparent airspace consolidation which could be either within the right middle lobe or right lower lobe, concerning for developing pneumonia. Followup PA and lateral chest X-ray is recommended in 3-4 weeks following trial of antibiotic therapy to ensure resolution and exclude underlying malignancy. 2. Aortic atherosclerosis. Electronically Signed   By: Vinnie Langton M.D.   On: 11/24/2017 11:12    STUDIES:  CXR with atelectasis on the left  SIGNIFICANT  EVENTS    ASSESSMENT / PLAN:  A/P:  83 year old male with PMH of AAA repair presenting with concern for leak that patient was given IVF for an angiogram to be done given CKI that developed pulmonary edema and hypoxemia.  Patient was diuresed but then subsequently had an O2 demand this AM.  CXR was performed that showed LLL atelectasis with a midline shift that I reviewed myself.  On exam, decreased BS on the left with some dullness to percussion.  Discussed with TRH-MD and PCCM-NP.  Hypoxemia:             - Titrate O2 for sat of 88-92%             - Doubt will need home O2.  LLL atelectasis:             - Chest PT             - Vibra vest             - Mucomyst nebs x4 doses             - CXR in AM             - Left lung down as able                - IS per RT protocol              - Flutter valve  Pulmonary edema:             - Lasix as ordered             - Strict I/O             - KVO IVF  Pulmonary infiltrate:             - No PNA, would not treat as patient does not appear  toxic.  PCCM will see again in AM post CXR.  Patient seen and examined, agree with above note.  I dictated the care and orders written for this patient under my direction.  Rush Farmer, [place]

## 2017-11-25 NOTE — Evaluation (Signed)
Physical Therapy Evaluation Patient Details Name: [name] MRN: [id number] DOB: 09-02-1927 Today's Date: 11/25/2017   History of Present Illness  Pt is an 83 y/o male admitted directly from nephrologist for AAA endoleak repair by IR. Post procedure pt with mild hypoxia and required supplemental O2. CXR demonstrated LLL atelectasis. PMH including but not limited to HTN, COPD, CKD and dementia.  Clinical Impression  Pt presented supine in bed with HOB elevated, awake and willing to participate in therapy session. [name] present throughout and providing history information. Prior to admission, pt was independent with all functional mobility and ADLs. Pt currently requires mod A for bed mobility and min-max A x2 for transfers as pt's bilateral knees buckling in standing. Pt would continue to benefit from skilled physical therapy services at this time while admitted and after d/c to address the below listed limitations in order to improve overall safety and independence with functional mobility.     Follow Up Recommendations SNF;Supervision/Assistance - 24 hour;Other (comment)(pt and family adamant about returning home; will need HHPT)    Equipment Recommendations  None recommended by PT    Recommendations for Other Services       Precautions / Restrictions Precautions Precautions: Fall Restrictions Weight Bearing Restrictions: No      Mobility  Bed Mobility Overal bed mobility: Needs Assistance Bed Mobility: Supine to Sit     Supine to sit: Min assist     General bed mobility comments: increased time and effort, assist to elevate trunk   Transfers Overall transfer level: Needs assistance Equipment used: Rolling walker (2 wheeled) Transfers: Sit to/from [facility] Sit to Stand: Min assist;+2 safety/equipment Stand pivot transfers: Max assist;+2 physical assistance;+2 safety/equipment       General transfer comment: assist for stability with  rise into standing from sitting EOB; pt with bilateral knee buckling during pivotal steps to chair requiring max A x2 to maintain upright position  Ambulation/Gait                Stairs            Wheelchair Mobility    Modified Rankin (Stroke Patients Only)       Balance Overall balance assessment: Needs assistance Sitting-balance support: Feet supported Sitting balance-Leahy Scale: Fair Sitting balance - Comments: pt able to sit EOB with min guard   Standing balance support: During functional activity;Bilateral upper extremity supported Standing balance-Leahy Scale: Poor Standing balance comment: required bilateral UE supports and mod-max A                             Pertinent Vitals/Pain Pain Assessment: No/denies pain    Home Living Family/patient expects to be discharged to:: Private residence Living Arrangements: Alone Available Help at Discharge: Family;Available 24 hours/day Type of Home: House Home Access: Stairs to enter   [employer] of Steps: 1 Home Layout: One level Home Equipment: Walker - 2 wheels;Cane - single point;Bedside commode;Shower seat      Prior Function Level of Independence: Independent         Comments: pt's daughter drives him wherever he needs to go. Pt was receiving OP therapy services 2x/week     Hand Dominance        Extremity/Trunk Assessment   Upper Extremity Assessment Upper Extremity Assessment: Defer to OT evaluation    Lower Extremity Assessment Lower Extremity Assessment: Generalized weakness    Cervical / Trunk Assessment Cervical / Trunk Assessment:  Kyphotic  Communication   Communication: HOH  Cognition Arousal/Alertness: Awake/alert Behavior During Therapy: WFL for tasks assessed/performed Overall Cognitive Status: History of cognitive impairments - at baseline                                 General Comments: pt with dementia at baseline       General Comments      Exercises     Assessment/Plan    PT Assessment Patient needs continued PT services  PT Problem List Decreased strength;Decreased activity tolerance;Decreased balance;Decreased mobility;Decreased coordination;Decreased cognition;Decreased knowledge of use of DME;Decreased safety awareness;Decreased knowledge of precautions;Cardiopulmonary status limiting activity       PT Treatment Interventions DME instruction;Gait training;Stair training;Functional mobility training;Therapeutic activities;Therapeutic exercise;Balance training;Neuromuscular re-education;Cognitive remediation;Patient/family education    PT Goals (Current goals can be found in the Care Plan section)  Acute Rehab PT Goals Patient Stated Goal: return to independence PT Goal Formulation: With patient/family Time For Goal Achievement: 12/09/17 Potential to Achieve Goals: Good    Frequency Min 3X/week   Barriers to discharge        Co-evaluation PT/OT/SLP Co-Evaluation/Treatment: Yes Reason for Co-Treatment: For patient/therapist safety;To address functional/ADL transfers PT goals addressed during session: Mobility/safety with mobility;Balance;Proper use of DME;Strengthening/ROM         AM-PAC PT "6 Clicks" Daily Activity  Outcome Measure Difficulty turning over in bed (including adjusting bedclothes, sheets and blankets)?: A Lot Difficulty moving from lying on back to sitting on the side of the bed? : Unable Difficulty sitting down on and standing up from a chair with arms (e.g., wheelchair, bedside commode, etc,.)?: Unable Help needed moving to and from a bed to chair (including a wheelchair)?: A Lot Help needed walking in hospital room?: A Lot Help needed climbing 3-5 steps with a railing? : Total 6 Click Score: 9    End of Session Equipment Utilized During Treatment: Gait belt Activity Tolerance: Patient limited by fatigue Patient left: in chair;with call bell/phone within  reach;with chair alarm set;with family/visitor present Nurse Communication: Mobility status;Need for lift equipment PT Visit Diagnosis: Other abnormalities of gait and mobility (R26.89)    Time: 9485-4627 PT Time Calculation (min) (ACUTE ONLY): 26 min   Charges:   PT Evaluation $PT Eval Moderate Complexity: 1 Mod     PT G Codes:        [place], PT, DPT [place] 11/25/2017, 12:55 PM

## 2017-11-25 NOTE — Evaluation (Signed)
Occupational Therapy Evaluation Patient Details Name: [name] MRN: [id number] DOB: Jun 30, 1927 Today's Date: 11/25/2017    History of Present Illness Pt is an 83 y/o male admitted directly from nephrologist for AAA endoleak repair by IR. Post procedure pt with mild hypoxia and required supplemental O2. CXR demonstrated LLL atelectasis. PMH including but not limited to HTN, COPD, CKD and dementia.   Clinical Impression   This 83 y/o M presents with the above. At baseline Pt lives alone, is independent with ADLs, does not drive. Pt presenting with generalized weakness, decreased endurance, ADL/mobility status. Pt completed sit<>stand with MinA+2, required MaxA +2 for stand pivot transfers to recliner at RW level due to bil knee buckling with mobility. Requires ModA (+2) for LB ADLs. Pt will benefit from continued acute OT services and recommend additional OT services in SNF setting prior to return home to maximize Pt's safety and independence with ADLs and mobility.     Follow Up Recommendations  SNF;Supervision/Assistance - 24 hour(if family chooses to return home with Pt, recommend HHOT)    Equipment Recommendations  None recommended by OT           Precautions / Restrictions Precautions Precautions: Fall Restrictions Weight Bearing Restrictions: No      Mobility Bed Mobility Overal bed mobility: Needs Assistance Bed Mobility: Supine to Sit     Supine to sit: Min assist     General bed mobility comments: increased time and effort, assist to elevate trunk   Transfers Overall transfer level: Needs assistance Equipment used: Rolling walker (2 wheeled) Transfers: Sit to/from [facility] Sit to Stand: Min assist;+2 safety/equipment Stand pivot transfers: Max assist;+2 physical assistance;+2 safety/equipment       General transfer comment: assist for stability with rise into standing from sitting EOB; pt with bilateral knee buckling during pivotal  steps to chair requiring max A x2 to maintain upright position    Balance Overall balance assessment: Needs assistance Sitting-balance support: Feet supported Sitting balance-Leahy Scale: Fair Sitting balance - Comments: pt able to sit EOB with min guard   Standing balance support: During functional activity;Bilateral upper extremity supported Standing balance-Leahy Scale: Poor Standing balance comment: required bilateral UE supports and mod-max A                           ADL either performed or assessed with clinical judgement   ADL Overall ADL's : Needs assistance/impaired Eating/Feeding: Set up;Sitting   Grooming: Set up;Supervision/safety;Sitting;Wash/dry face   Upper Body Bathing: Min guard;Sitting   Lower Body Bathing: Minimal assistance;+2 for physical assistance;+2 for safety/equipment;Sit to/from stand   Upper Body Dressing : Min guard;Sitting   Lower Body Dressing: Moderate assistance;+2 for physical assistance;+2 for safety/equipment;Sit to/from stand Lower Body Dressing Details (indicate cue type and reason): Pt able to reach and adjust socks sitting in recliner  Toilet Transfer: Maximal assistance;+2 for safety/equipment;+2 for physical assistance;Stand-pivot;BSC;RW Toilet Transfer Details (indicate cue type and reason): simulated in transfer to [place] and Hygiene: Maximal assistance;+2 for physical assistance;+2 for safety/equipment;Sit to/from stand       Functional mobility during ADLs: Maximal assistance;+2 for physical assistance;+2 for safety/equipment;Rolling walker General ADL Comments: Pt with bil knee buckling during transfer requiring increased assist; presenting with general weakness, decreased ADL and functional mobility status                          Pertinent Vitals/Pain Pain  Assessment: No/denies pain          Extremity/Trunk Assessment Upper Extremity Assessment Upper Extremity  Assessment: Generalized weakness   Lower Extremity Assessment Lower Extremity Assessment: Defer to PT evaluation   Cervical / Trunk Assessment Cervical / Trunk Assessment: Kyphotic   Communication Communication Communication: HOH   Cognition Arousal/Alertness: Awake/alert Behavior During Therapy: WFL for tasks assessed/performed Overall Cognitive Status: History of cognitive impairments - at baseline                                 General Comments: pt with dementia at baseline   General [place] expects to be discharged to:: Private residence Living Arrangements: Alone Available Help at Discharge: Family;Available 24 hours/day Type of Home: House Home Access: Stairs to enter [employer] of Steps: 1   Home Layout: One level     Bathroom Shower/Tub: Occupational psychologist: Standard     Home Equipment: Environmental consultant - 2 wheels;Cane - single point;Bedside commode;Shower seat          Prior Functioning/Environment Level of Independence: Independent        Comments: pt's daughter drives him wherever he needs to go. Pt was receiving OP therapy services 2x/week        OT Problem List: Decreased strength;Impaired balance (sitting and/or standing);Decreased activity tolerance;Decreased knowledge of use of DME or AE;Decreased cognition      OT Treatment/Interventions: Self-care/ADL training;DME and/or AE instruction;Therapeutic activities;Balance training;Therapeutic exercise;Energy conservation;Patient/family education    OT Goals(Current goals can be found in the care plan section) Acute Rehab OT Goals Patient Stated Goal: return to independence OT Goal Formulation: With patient Time For Goal Achievement: 12/09/17 Potential to Achieve Goals: Good  OT Frequency: Min 2X/week               Co-evaluation PT/OT/SLP Co-Evaluation/Treatment: Yes Reason for Co-Treatment: For  patient/therapist safety;To address functional/ADL transfers PT goals addressed during session: Mobility/safety with mobility;Balance;Proper use of DME;Strengthening/ROM OT goals addressed during session: ADL's and self-care;Proper use of Adaptive equipment and DME      AM-PAC PT "6 Clicks" Daily Activity     Outcome Measure Help from another person eating meals?: None Help from another person taking care of personal grooming?: A Little Help from another person toileting, which includes using toliet, bedpan, or urinal?: A Lot Help from another person bathing (including washing, rinsing, drying)?: A Lot Help from another person to put on and taking off regular upper body clothing?: A Little Help from another person to put on and taking off regular lower body clothing?: A Lot 6 Click Score: 16   End of Session Equipment Utilized During Treatment: Gait belt;Rolling walker Nurse Communication: Mobility status;Need for lift equipment  Activity Tolerance: Patient tolerated treatment well Patient left: in chair;with call bell/phone within reach;with chair alarm set;with family/visitor present  OT Visit Diagnosis: Unsteadiness on feet (R26.81);Muscle weakness (generalized) (M62.81)                Time: 1937-9024 OT Time Calculation (min): 25 min Charges:  OT General Charges $OT Visit: 1 Visit OT Evaluation $OT Eval Moderate Complexity: 1 Mod G-Codes:     [name], OT Pager [phone] 11/25/2017   [name] 11/25/2017, 1:53 PM

## 2017-11-25 NOTE — Progress Notes (Signed)
Pts. B/P is 118/49 and HR is 60. MD notified. MD discontinued orders for hydrochlorothiazide and hydralazine. Orders to still give labetalol. Orders followed. Will continue to monitor.

## 2017-11-25 NOTE — Progress Notes (Signed)
Patient ID: [name], male   DOB: Feb 26, 1927, 83 y.o.   MRN: [id number]     Supervising Physician: Jacqulynn Cadet  Patient Status: [facility] - In-pt  Chief Complaint: enodleak  Subjective: Patient is very tired.  He spent the whole night coughing.  He denies any pain. On O2 via [place].  Allergies: Iodinated diagnostic agents; Ioxaglate; and Baclofen  Medications: Prior to Admission medications   Medication Sig Start Date End Date Taking? Authorizing Provider  Carbidopa-Levodopa ER (SINEMET CR) 25-100 MG tablet controlled release Take 0.5 tablets by mouth 2 (two) times daily.   Yes [provider]  Cholecalciferol (VITAMIN D) 2000 units CAPS Take 2,000 Units by mouth daily.   Yes [provider]  docusate sodium (COLACE) 100 MG capsule Take 100 mg by mouth daily.   Yes [provider]  donepezil (ARICEPT) 5 MG tablet Take 5 mg by mouth at bedtime.   Yes [provider]  FeFum-FePo-FA-B Cmp-C-Zn-Mn-Cu (SE-TAN PLUS) 162-115.2-1 MG CAPS Take 1 capsule by mouth daily. Take 1 Tab  daily   Yes [provider]  hydrALAZINE (APRESOLINE) 25 MG tablet Take 25 mg by mouth 3 (three) times daily.   Yes [provider]  hydrochlorothiazide (HYDRODIURIL) 12.5 MG tablet Take 12.5 mg daily by mouth.   Yes [provider]  labetalol (NORMODYNE) 100 MG tablet Take 1 tablet (100 mg total) by mouth 2 (two) times daily. 07/14/15  Yes Paz, Alda Berthold, MD  prednisoLONE acetate (PRED FORTE) 1 % ophthalmic suspension Place 1 drop into both eyes 2 (two) times daily. 11/16/17  Yes [provider]  pregabalin (LYRICA) 50 MG capsule Take 50 mg by mouth at bedtime.    Yes [provider]  sodium polystyrene (KAYEXALATE) 15 GM/60ML suspension Take 15 g by mouth 2 (two) times a week. Take 15 Grams on Monday and Friday   Yes [provider]    Vital Signs: BP (!) 118/49   Pulse 60   Temp 98.5 F (36.9 C) (Oral)   Resp 16   Ht 5'  8" (1.727 m)   Wt 157 lb (71.2 kg)   SpO2 95%   BMI 23.87 kg/m   Physical Exam: Chest: decreased breath sounds on the left side.  Mild expiratory wheeze on right. Abd: soft, NT, ND, +BS.  R CFA site is intact  Imaging: Dg Chest 2 View  Result Date: 11/25/2017 CLINICAL DATA:  Cough, shortness of breath. EXAM: CHEST  2 VIEW COMPARISON:  Radiograph of November 24, 2017. FINDINGS: Stable cardiomegaly. Atherosclerosis of thoracic aorta is noted. Significantly increased opacity of the left lung is noted most consistent with atelectasis and associated pleural effusion. Significantly increased right to left midline shift is noted most consistent with volume loss. Hyperexpansion of the right lung is noted with mild right basilar atelectasis. No pneumothorax is noted. Bony thorax is unremarkable. IMPRESSION: Significant right to left mediastinal shift is noted most likely due to atelectasis and volume loss in the left hemithorax, with possible associated pleural effusion. Mild right basilar subsegmental atelectasis is noted. Electronically Signed   By: Marijo Conception, M.D.   On: 11/25/2017 10:21   Ir Angiogram Visceral Selective  Result Date: 11/24/2017 INDICATION: 83 year old male with an enlarging infrarenal abdominal aortic aneurysm despite prior endovascular aortic repair. 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 Patient has significant chronic kidney disease and was admitted yesterday for pre-procedure hydration.  He presents now for multi selective angiography and attempted endoleak repair. EXAM: SELECTIVE VISCERAL ARTERIOGRAPHY; ADDITIONAL ARTERIOGRAPHY; IR ULTRASOUND GUIDANCE VASC ACCESS RIGHT; PELVIC SELECTIVE ARTERIOGRAPHY MEDICATIONS: None ANESTHESIA/SEDATION: Moderate (conscious) sedation was employed during this procedure. A total of Versed 2 mg and Fentanyl 100 mcg was administered intravenously.  Moderate Sedation Time: 115 minutes. The patient's level of consciousness and vital signs were monitored continuously by radiology nursing throughout the procedure under my direct supervision. CONTRAST:  46 mL Visipaque 320 FLUOROSCOPY TIME:  Fluoroscopy Time: 19 minutes 12 seconds (911 mGy). COMPLICATIONS: None immediate. PROCEDURE: Informed consent was obtained from the patient following explanation of the procedure, risks, benefits and alternatives. The patient understands, agrees and consents for the procedure. All questions were addressed. A time out was performed prior to the initiation of the procedure. Maximal barrier sterile technique utilized including caps, mask, sterile gowns, sterile gloves, large sterile drape, hand hygiene, and Betadine prep. The right common femoral artery was interrogated with ultrasound and found to be widely patent. An image was obtained and stored for the medical record. Local anesthesia was attained by infiltration with 1% lidocaine. A small dermatotomy was made. Under real-time sonographic guidance, the vessel was punctured with a 21 gauge micropuncture needle. Using standard technique, the initial micro needle was exchanged over a 0.018 micro wire for a transitional 4 [place] micro sheath. The micro sheath was then exchanged over a 0.035 wire for a 5 French vascular sheath. A C2 cobra catheter was advanced of the abdominal aorta over a Bentson wire in used to select the celiac artery. A celiac arteriogram was performed. Conventional anatomy. No evidence of replaced middle colloid artery. The C2 catheter was next advanced into the superior mesenteric artery. A superior mesenteric arteriogram was performed using carbon dioxide gas. This resulted in suboptimal visualization. Therefore, arteriography was performed using Visipaque. The middle colloid artery arises proximally from the SMA. There is a visible marginal artery of [place] extending into the left colloid artery and then  the IMA. No definite filling of the aneurysm sac. An echelon 14 microcatheter was then successfully navigated over a Fathom 14 wire into the middle colloid artery. Arteriography confirms the catheter position. The catheter was next navigated into the marginal artery of [place]. Arteriography again performed confirming catheter position and anatomy. The artery was then advanced into the left colloid artery. Arteriography again confirms catheter position and anatomy. Still no filling of the aneurysm sac. It is difficult to tell if the IMA is occluded or if there is outflow from the aneurysm sac. Finally, after significant effort, the microcatheter was successfully advanced into the SMA. Arteriography was performed. The origin of the SMA is occluded. There is a thin channel leading to the aneurysm sac in an filling the Vasa basal rim. There is no endoleak arising from the IMA. The catheter was removed. The C2 cobra catheter was readvanced through the sheath and into the internal iliac artery. An internal iliac arteriogram was performed. The ascending ileo lumbar artery is successfully identified. No evidence of right-sided lumbar artery endoleak. The C2 catheter was removed. A limited right common femoral arteriogram was performed confirming common femoral arterial access. Hemostasis was attained with the assistance of a 6 French Angio-Seal device. IMPRESSION: 1. 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 No evidence of type 2 endoleak arising from the IMA, or the right-sided lumbar arteries. Signed, Criselda Peaches, MD [facility] Radiology Electronically Signed   By:  Jacqulynn Cadet M.D.   On: 11/24/2017 14:29   Ir Angiogram Visceral Selective  Result Date: 11/24/2017 INDICATION: 83 year old male with an enlarging infrarenal abdominal aortic aneurysm despite prior endovascular aortic repair.  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 Patient has significant chronic kidney disease and was admitted yesterday for pre-procedure hydration. He presents now for multi selective angiography and attempted endoleak repair. EXAM: SELECTIVE VISCERAL ARTERIOGRAPHY; ADDITIONAL ARTERIOGRAPHY; IR ULTRASOUND GUIDANCE VASC ACCESS RIGHT; PELVIC SELECTIVE ARTERIOGRAPHY MEDICATIONS: None ANESTHESIA/SEDATION: Moderate (conscious) sedation was employed during this procedure. A total of Versed 2 mg and Fentanyl 100 mcg was administered intravenously. Moderate Sedation Time: 115 minutes. The patient's level of consciousness and vital signs were monitored continuously by radiology nursing throughout the procedure under my direct supervision. CONTRAST:  46 mL Visipaque 320 FLUOROSCOPY TIME:  Fluoroscopy Time: 19 minutes 12 seconds (911 mGy). COMPLICATIONS: None immediate. PROCEDURE: Informed consent was obtained from the patient following explanation of the procedure, risks, benefits and alternatives. The patient understands, agrees and consents for the procedure. All questions were addressed. A time out was performed prior to the initiation of the procedure. Maximal barrier sterile technique utilized including caps, mask, sterile gowns, sterile gloves, large sterile drape, hand hygiene, and Betadine prep. The right common femoral artery was interrogated with ultrasound and found to be widely patent. An image was obtained and stored for the medical record. Local anesthesia was attained by infiltration with 1% lidocaine. A small dermatotomy was made. Under real-time sonographic guidance, the vessel was punctured with a 21 gauge micropuncture needle. Using standard technique, the initial micro needle was exchanged over a 0.018 micro wire for a transitional 4 [place] micro sheath. The micro sheath was then exchanged over a 0.035 wire for a  5 French vascular sheath. A C2 cobra catheter was advanced of the abdominal aorta over a Bentson wire in used to select the celiac artery. A celiac arteriogram was performed. Conventional anatomy. No evidence of replaced middle colloid artery. The C2 catheter was next advanced into the superior mesenteric artery. A superior mesenteric arteriogram was performed using carbon dioxide gas. This resulted in suboptimal visualization. Therefore, arteriography was performed using Visipaque. The middle colloid artery arises proximally from the SMA. There is a visible marginal artery of [place] extending into the left colloid artery and then the IMA. No definite filling of the aneurysm sac. An echelon 14 microcatheter was then successfully navigated over a Fathom 14 wire into the middle colloid artery. Arteriography confirms the catheter position. The catheter was next navigated into the marginal artery of [place]. Arteriography again performed confirming catheter position and anatomy. The artery was then advanced into the left colloid artery. Arteriography again confirms catheter position and anatomy. Still no filling of the aneurysm sac. It is difficult to tell if the IMA is occluded or if there is outflow from the aneurysm sac. Finally, after significant effort, the microcatheter was successfully advanced into the SMA. Arteriography was performed. The origin of the SMA is occluded. There is a thin channel leading to the aneurysm sac in an filling the Vasa basal rim. There is no endoleak arising from the IMA. The catheter was removed. The C2 cobra catheter was readvanced through the sheath and into the internal iliac artery. An internal iliac arteriogram was performed. The ascending ileo lumbar artery is successfully identified. No evidence of right-sided lumbar artery endoleak. The C2 catheter was removed. A limited right common  femoral arteriogram was performed confirming common femoral arterial access. Hemostasis was  attained with the assistance of a 6 French Angio-Seal device. IMPRESSION: 1. 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 No evidence of type 2 endoleak arising from the IMA, or the right-sided lumbar arteries. Signed, Criselda Peaches, MD [facility] Radiology Electronically Signed   By: Jacqulynn Cadet M.D.   On: 11/24/2017 14:29   Ir Angiogram Pelvis Selective Or Supraselective  Result Date: 11/24/2017 INDICATION: 83 year old male with an enlarging infrarenal abdominal aortic aneurysm despite prior endovascular aortic repair. 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 Patient has significant chronic kidney disease and was admitted yesterday for pre-procedure hydration. He presents now for multi selective angiography and attempted endoleak repair. EXAM: SELECTIVE VISCERAL ARTERIOGRAPHY; ADDITIONAL ARTERIOGRAPHY; IR ULTRASOUND GUIDANCE VASC ACCESS RIGHT; PELVIC SELECTIVE ARTERIOGRAPHY MEDICATIONS: None ANESTHESIA/SEDATION: Moderate (conscious) sedation was employed during this procedure. A total of Versed 2 mg and Fentanyl 100 mcg was administered intravenously. Moderate Sedation Time: 115 minutes. The patient's level of consciousness and vital signs were monitored continuously by radiology nursing throughout the procedure under my direct supervision. CONTRAST:  46 mL Visipaque 320 FLUOROSCOPY TIME:  Fluoroscopy Time: 19 minutes 12 seconds (911 mGy). COMPLICATIONS: None immediate. PROCEDURE: Informed consent was obtained from the patient following explanation of the procedure, risks, benefits and alternatives. The patient understands, agrees and consents for the procedure. All questions were addressed. A time out was performed prior to the initiation of the procedure. Maximal barrier sterile technique  utilized including caps, mask, sterile gowns, sterile gloves, large sterile drape, hand hygiene, and Betadine prep. The right common femoral artery was interrogated with ultrasound and found to be widely patent. An image was obtained and stored for the medical record. Local anesthesia was attained by infiltration with 1% lidocaine. A small dermatotomy was made. Under real-time sonographic guidance, the vessel was punctured with a 21 gauge micropuncture needle. Using standard technique, the initial micro needle was exchanged over a 0.018 micro wire for a transitional 4 [place] micro sheath. The micro sheath was then exchanged over a 0.035 wire for a 5 French vascular sheath. A C2 cobra catheter was advanced of the abdominal aorta over a Bentson wire in used to select the celiac artery. A celiac arteriogram was performed. Conventional anatomy. No evidence of replaced middle colloid artery. The C2 catheter was next advanced into the superior mesenteric artery. A superior mesenteric arteriogram was performed using carbon dioxide gas. This resulted in suboptimal visualization. Therefore, arteriography was performed using Visipaque. The middle colloid artery arises proximally from the SMA. There is a visible marginal artery of [place] extending into the left colloid artery and then the IMA. No definite filling of the aneurysm sac. An echelon 14 microcatheter was then successfully navigated over a Fathom 14 wire into the middle colloid artery. Arteriography confirms the catheter position. The catheter was next navigated into the marginal artery of [place]. Arteriography again performed confirming catheter position and anatomy. The artery was then advanced into the left colloid artery. Arteriography again confirms catheter position and anatomy. Still no filling of the aneurysm sac. It is difficult to tell if the IMA is occluded or if there is outflow from the aneurysm sac. Finally, after significant effort, the  microcatheter was successfully advanced into the SMA. Arteriography was performed. The origin of the SMA is occluded. There is a  thin channel leading to the aneurysm sac in an filling the Vasa basal rim. There is no endoleak arising from the IMA. The catheter was removed. The C2 cobra catheter was readvanced through the sheath and into the internal iliac artery. An internal iliac arteriogram was performed. The ascending ileo lumbar artery is successfully identified. No evidence of right-sided lumbar artery endoleak. The C2 catheter was removed. A limited right common femoral arteriogram was performed confirming common femoral arterial access. Hemostasis was attained with the assistance of a 6 French Angio-Seal device. IMPRESSION: 1. 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 No evidence of type 2 endoleak arising from the IMA, or the right-sided lumbar arteries. Signed, Criselda Peaches, MD [facility] Radiology Electronically Signed   By: Jacqulynn Cadet M.D.   On: 11/24/2017 14:29   Ir Angiogram Selective Each Additional Vessel  Result Date: 11/24/2017 INDICATION: 83 year old male with an enlarging infrarenal abdominal aortic aneurysm despite prior endovascular aortic repair. 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 Patient has significant chronic kidney disease and was admitted yesterday for pre-procedure hydration. He presents now for multi selective angiography and attempted endoleak repair. EXAM: SELECTIVE VISCERAL ARTERIOGRAPHY; ADDITIONAL ARTERIOGRAPHY; IR ULTRASOUND GUIDANCE VASC ACCESS RIGHT; PELVIC SELECTIVE ARTERIOGRAPHY MEDICATIONS: None ANESTHESIA/SEDATION: Moderate (conscious) sedation was employed during this procedure. A total of Versed 2 mg and Fentanyl 100 mcg was administered  intravenously. Moderate Sedation Time: 115 minutes. The patient's level of consciousness and vital signs were monitored continuously by radiology nursing throughout the procedure under my direct supervision. CONTRAST:  46 mL Visipaque 320 FLUOROSCOPY TIME:  Fluoroscopy Time: 19 minutes 12 seconds (911 mGy). COMPLICATIONS: None immediate. PROCEDURE: Informed consent was obtained from the patient following explanation of the procedure, risks, benefits and alternatives. The patient understands, agrees and consents for the procedure. All questions were addressed. A time out was performed prior to the initiation of the procedure. Maximal barrier sterile technique utilized including caps, mask, sterile gowns, sterile gloves, large sterile drape, hand hygiene, and Betadine prep. The right common femoral artery was interrogated with ultrasound and found to be widely patent. An image was obtained and stored for the medical record. Local anesthesia was attained by infiltration with 1% lidocaine. A small dermatotomy was made. Under real-time sonographic guidance, the vessel was punctured with a 21 gauge micropuncture needle. Using standard technique, the initial micro needle was exchanged over a 0.018 micro wire for a transitional 4 [place] micro sheath. The micro sheath was then exchanged over a 0.035 wire for a 5 French vascular sheath. A C2 cobra catheter was advanced of the abdominal aorta over a Bentson wire in used to select the celiac artery. A celiac arteriogram was performed. Conventional anatomy. No evidence of replaced middle colloid artery. The C2 catheter was next advanced into the superior mesenteric artery. A superior mesenteric arteriogram was performed using carbon dioxide gas. This resulted in suboptimal visualization. Therefore, arteriography was performed using Visipaque. The middle colloid artery arises proximally from the SMA. There is a visible marginal artery of [place] extending into the left colloid  artery and then the IMA. No definite filling of the aneurysm sac. An echelon 14 microcatheter was then successfully navigated over a Fathom 14 wire into the middle colloid artery. Arteriography confirms the catheter position. The catheter was next navigated into the marginal artery of [place]. Arteriography again performed confirming catheter position  and anatomy. The artery was then advanced into the left colloid artery. Arteriography again confirms catheter position and anatomy. Still no filling of the aneurysm sac. It is difficult to tell if the IMA is occluded or if there is outflow from the aneurysm sac. Finally, after significant effort, the microcatheter was successfully advanced into the SMA. Arteriography was performed. The origin of the SMA is occluded. There is a thin channel leading to the aneurysm sac in an filling the Vasa basal rim. There is no endoleak arising from the IMA. The catheter was removed. The C2 cobra catheter was readvanced through the sheath and into the internal iliac artery. An internal iliac arteriogram was performed. The ascending ileo lumbar artery is successfully identified. No evidence of right-sided lumbar artery endoleak. The C2 catheter was removed. A limited right common femoral arteriogram was performed confirming common femoral arterial access. Hemostasis was attained with the assistance of a 6 French Angio-Seal device. IMPRESSION: 1. 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 No evidence of type 2 endoleak arising from the IMA, or the right-sided lumbar arteries. Signed, Criselda Peaches, MD [facility] Radiology Electronically Signed   By: Jacqulynn Cadet M.D.   On: 11/24/2017 14:29   Ir Angiogram Selective Each Additional Vessel  Result Date: 11/24/2017 INDICATION: 83 year old male with an enlarging infrarenal abdominal aortic aneurysm despite prior  endovascular aortic repair. 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 Patient has significant chronic kidney disease and was admitted yesterday for pre-procedure hydration. He presents now for multi selective angiography and attempted endoleak repair. EXAM: SELECTIVE VISCERAL ARTERIOGRAPHY; ADDITIONAL ARTERIOGRAPHY; IR ULTRASOUND GUIDANCE VASC ACCESS RIGHT; PELVIC SELECTIVE ARTERIOGRAPHY MEDICATIONS: None ANESTHESIA/SEDATION: Moderate (conscious) sedation was employed during this procedure. A total of Versed 2 mg and Fentanyl 100 mcg was administered intravenously. Moderate Sedation Time: 115 minutes. The patient's level of consciousness and vital signs were monitored continuously by radiology nursing throughout the procedure under my direct supervision. CONTRAST:  46 mL Visipaque 320 FLUOROSCOPY TIME:  Fluoroscopy Time: 19 minutes 12 seconds (911 mGy). COMPLICATIONS: None immediate. PROCEDURE: Informed consent was obtained from the patient following explanation of the procedure, risks, benefits and alternatives. The patient understands, agrees and consents for the procedure. All questions were addressed. A time out was performed prior to the initiation of the procedure. Maximal barrier sterile technique utilized including caps, mask, sterile gowns, sterile gloves, large sterile drape, hand hygiene, and Betadine prep. The right common femoral artery was interrogated with ultrasound and found to be widely patent. An image was obtained and stored for the medical record. Local anesthesia was attained by infiltration with 1% lidocaine. A small dermatotomy was made. Under real-time sonographic guidance, the vessel was punctured with a 21 gauge micropuncture needle. Using standard technique, the initial micro needle was exchanged over a 0.018 micro wire for a transitional 4 [place] micro sheath. The micro sheath was then  exchanged over a 0.035 wire for a 5 French vascular sheath. A C2 cobra catheter was advanced of the abdominal aorta over a Bentson wire in used to select the celiac artery. A celiac arteriogram was performed. Conventional anatomy. No evidence of replaced middle colloid artery. The C2 catheter was next advanced into the superior mesenteric artery. A superior mesenteric arteriogram was performed using carbon dioxide gas. This resulted in suboptimal visualization. Therefore, arteriography was performed using Visipaque. The middle colloid artery  arises proximally from the SMA. There is a visible marginal artery of Drummond extending into the left colloid artery and then the IMA. No definite filling of the aneurysm sac. An echelon 14 microcatheter was then successfully navigated over a Fathom 14 wire into the middle colloid artery. Arteriography confirms the catheter position. The catheter was next navigated into the marginal artery of Drummond. Arteriography again performed confirming catheter position and anatomy. The artery was then advanced into the left colloid artery. Arteriography again confirms catheter position and anatomy. Still no filling of the aneurysm sac. It is difficult to tell if the IMA is occluded or if there is outflow from the aneurysm sac. Finally, after significant effort, the microcatheter was successfully advanced into the SMA. Arteriography was performed. The origin of the SMA is occluded. There is a thin channel leading to the aneurysm sac in an filling the Vasa basal rim. There is no endoleak arising from the IMA. The catheter was removed. The C2 cobra catheter was readvanced through the sheath and into the internal iliac artery. An internal iliac arteriogram was performed. The ascending ileo lumbar artery is successfully identified. No evidence of right-sided lumbar artery endoleak. The C2 catheter was removed. A limited right common femoral arteriogram was performed confirming common femoral  arterial access. Hemostasis was attained with the assistance of a 6 French Angio-Seal device. IMPRESSION: 1. 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 No evidence of type 2 endoleak arising from the IMA, or the right-sided lumbar arteries. Signed, Criselda Peaches, MD [facility] Radiology Electronically Signed   By: Jacqulynn Cadet M.D.   On: 11/24/2017 14:29   Ir Angiogram Selective Each Additional Vessel  Result Date: 11/24/2017 INDICATION: 83 year old male with an enlarging infrarenal abdominal aortic aneurysm despite prior endovascular aortic repair. 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 Patient has significant chronic kidney disease and was admitted yesterday for pre-procedure hydration. He presents now for multi selective angiography and attempted endoleak repair. EXAM: SELECTIVE VISCERAL ARTERIOGRAPHY; ADDITIONAL ARTERIOGRAPHY; IR ULTRASOUND GUIDANCE VASC ACCESS RIGHT; PELVIC SELECTIVE ARTERIOGRAPHY MEDICATIONS: None ANESTHESIA/SEDATION: Moderate (conscious) sedation was employed during this procedure. A total of Versed 2 mg and Fentanyl 100 mcg was administered intravenously. Moderate Sedation Time: 115 minutes. The patient's level of consciousness and vital signs were monitored continuously by radiology nursing throughout the procedure under my direct supervision. CONTRAST:  46 mL Visipaque 320 FLUOROSCOPY TIME:  Fluoroscopy Time: 19 minutes 12 seconds (911 mGy). COMPLICATIONS: None immediate. PROCEDURE: Informed consent was obtained from the patient following explanation of the procedure, risks, benefits and alternatives. The patient understands, agrees and consents for the procedure. All questions were addressed. A time out was performed prior to the initiation of the procedure.  Maximal barrier sterile technique utilized including caps, mask, sterile gowns, sterile gloves, large sterile drape, hand hygiene, and Betadine prep. The right common femoral artery was interrogated with ultrasound and found to be widely patent. An image was obtained and stored for the medical record. Local anesthesia was attained by infiltration with 1% lidocaine. A small dermatotomy was made. Under real-time sonographic guidance, the vessel was punctured with a 21 gauge micropuncture needle. Using standard technique, the initial micro needle was exchanged over a 0.018 micro wire for a transitional 4 [place] micro sheath. The micro sheath was then exchanged over a 0.035 wire for a 5 French vascular sheath. A  C2 cobra catheter was advanced of the abdominal aorta over a Bentson wire in used to select the celiac artery. A celiac arteriogram was performed. Conventional anatomy. No evidence of replaced middle colloid artery. The C2 catheter was next advanced into the superior mesenteric artery. A superior mesenteric arteriogram was performed using carbon dioxide gas. This resulted in suboptimal visualization. Therefore, arteriography was performed using Visipaque. The middle colloid artery arises proximally from the SMA. There is a visible marginal artery of Drummond extending into the left colloid artery and then the IMA. No definite filling of the aneurysm sac. An echelon 14 microcatheter was then successfully navigated over a Fathom 14 wire into the middle colloid artery. Arteriography confirms the catheter position. The catheter was next navigated into the marginal artery of Drummond. Arteriography again performed confirming catheter position and anatomy. The artery was then advanced into the left colloid artery. Arteriography again confirms catheter position and anatomy. Still no filling of the aneurysm sac. It is difficult to tell if the IMA is occluded or if there is outflow from the aneurysm sac. Finally, after  significant effort, the microcatheter was successfully advanced into the SMA. Arteriography was performed. The origin of the SMA is occluded. There is a thin channel leading to the aneurysm sac in an filling the Vasa basal rim. There is no endoleak arising from the IMA. The catheter was removed. The C2 cobra catheter was readvanced through the sheath and into the internal iliac artery. An internal iliac arteriogram was performed. The ascending ileo lumbar artery is successfully identified. No evidence of right-sided lumbar artery endoleak. The C2 catheter was removed. A limited right common femoral arteriogram was performed confirming common femoral arterial access. Hemostasis was attained with the assistance of a 6 French Angio-Seal device. IMPRESSION: 1. 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 No evidence of type 2 endoleak arising from the IMA, or the right-sided lumbar arteries. Signed, Criselda Peaches, MD [facility] Radiology Electronically Signed   By: Jacqulynn Cadet M.D.   On: 11/24/2017 14:29   Ir Angiogram Selective Each Additional Vessel  Result Date: 11/24/2017 INDICATION: 83 year old male with an enlarging infrarenal abdominal aortic aneurysm despite prior endovascular aortic repair. 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 Patient has significant chronic kidney disease and was admitted yesterday for pre-procedure hydration. He presents now for multi selective angiography and attempted endoleak repair. EXAM: SELECTIVE VISCERAL ARTERIOGRAPHY; ADDITIONAL ARTERIOGRAPHY; IR ULTRASOUND GUIDANCE VASC ACCESS RIGHT; PELVIC SELECTIVE ARTERIOGRAPHY MEDICATIONS: None ANESTHESIA/SEDATION: Moderate (conscious) sedation was employed during this procedure. A total of Versed 2 mg and Fentanyl 100  mcg was administered intravenously. Moderate Sedation Time: 115 minutes. The patient's level of consciousness and vital signs were monitored continuously by radiology nursing throughout the procedure under my direct supervision. CONTRAST:  46 mL Visipaque 320 FLUOROSCOPY TIME:  Fluoroscopy Time: 19 minutes 12 seconds (911 mGy). COMPLICATIONS: None immediate. PROCEDURE: Informed consent was obtained from the patient following explanation of the procedure, risks, benefits and alternatives. The patient understands, agrees and consents for the procedure. All questions were addressed. A time out was performed prior to the initiation of the procedure. Maximal barrier sterile technique utilized including caps, mask, sterile gowns, sterile gloves, large sterile drape, hand hygiene, and Betadine prep. The right common femoral artery was interrogated with ultrasound and found to be widely patent. An image was obtained and stored  for the medical record. Local anesthesia was attained by infiltration with 1% lidocaine. A small dermatotomy was made. Under real-time sonographic guidance, the vessel was punctured with a 21 gauge micropuncture needle. Using standard technique, the initial micro needle was exchanged over a 0.018 micro wire for a transitional 4 [place] micro sheath. The micro sheath was then exchanged over a 0.035 wire for a 5 French vascular sheath. A C2 cobra catheter was advanced of the abdominal aorta over a Bentson wire in used to select the celiac artery. A celiac arteriogram was performed. Conventional anatomy. No evidence of replaced middle colloid artery. The C2 catheter was next advanced into the superior mesenteric artery. A superior mesenteric arteriogram was performed using carbon dioxide gas. This resulted in suboptimal visualization. Therefore, arteriography was performed using Visipaque. The middle colloid artery arises proximally from the SMA. There is a visible marginal artery of Drummond extending  into the left colloid artery and then the IMA. No definite filling of the aneurysm sac. An echelon 14 microcatheter was then successfully navigated over a Fathom 14 wire into the middle colloid artery. Arteriography confirms the catheter position. The catheter was next navigated into the marginal artery of Drummond. Arteriography again performed confirming catheter position and anatomy. The artery was then advanced into the left colloid artery. Arteriography again confirms catheter position and anatomy. Still no filling of the aneurysm sac. It is difficult to tell if the IMA is occluded or if there is outflow from the aneurysm sac. Finally, after significant effort, the microcatheter was successfully advanced into the SMA. Arteriography was performed. The origin of the SMA is occluded. There is a thin channel leading to the aneurysm sac in an filling the Vasa basal rim. There is no endoleak arising from the IMA. The catheter was removed. The C2 cobra catheter was readvanced through the sheath and into the internal iliac artery. An internal iliac arteriogram was performed. The ascending ileo lumbar artery is successfully identified. No evidence of right-sided lumbar artery endoleak. The C2 catheter was removed. A limited right common femoral arteriogram was performed confirming common femoral arterial access. Hemostasis was attained with the assistance of a 6 French Angio-Seal device. IMPRESSION: 1. 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 No evidence of type 2 endoleak arising from the IMA, or the right-sided lumbar arteries. Signed, Criselda Peaches, MD [facility] Radiology Electronically Signed   By: Jacqulynn Cadet M.D.   On: 11/24/2017 14:29   Ir US Guide Vasc Access Right  Result Date: 11/24/2017 INDICATION: 83 year old male with an enlarging infrarenal abdominal aortic aneurysm despite  prior endovascular aortic repair. 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 Patient has significant chronic kidney disease and was admitted yesterday for pre-procedure hydration. He presents now for multi selective angiography and attempted endoleak repair. EXAM: SELECTIVE VISCERAL ARTERIOGRAPHY; ADDITIONAL ARTERIOGRAPHY; IR ULTRASOUND GUIDANCE VASC ACCESS RIGHT; PELVIC SELECTIVE ARTERIOGRAPHY MEDICATIONS: None ANESTHESIA/SEDATION: Moderate (conscious) sedation was employed during this procedure. A total of Versed 2 mg and Fentanyl 100 mcg was administered intravenously. Moderate Sedation Time: 115 minutes. The patient's level of consciousness and vital signs were monitored continuously by radiology nursing throughout the procedure under my direct supervision. CONTRAST:  46 mL Visipaque 320 FLUOROSCOPY TIME:  Fluoroscopy Time: 19 minutes 12 seconds (911 mGy). COMPLICATIONS: None immediate. PROCEDURE: Informed consent was obtained from the patient following explanation of the procedure,  risks, benefits and alternatives. The patient understands, agrees and consents for the procedure. All questions were addressed. A time out was performed prior to the initiation of the procedure. Maximal barrier sterile technique utilized including caps, mask, sterile gowns, sterile gloves, large sterile drape, hand hygiene, and Betadine prep. The right common femoral artery was interrogated with ultrasound and found to be widely patent. An image was obtained and stored for the medical record. Local anesthesia was attained by infiltration with 1% lidocaine. A small dermatotomy was made. Under real-time sonographic guidance, the vessel was punctured with a 21 gauge micropuncture needle. Using standard technique, the initial micro needle was exchanged over a 0.018 micro wire for a transitional 4 [place] micro sheath. The micro sheath was then  exchanged over a 0.035 wire for a 5 French vascular sheath. A C2 cobra catheter was advanced of the abdominal aorta over a Bentson wire in used to select the celiac artery. A celiac arteriogram was performed. Conventional anatomy. No evidence of replaced middle colloid artery. The C2 catheter was next advanced into the superior mesenteric artery. A superior mesenteric arteriogram was performed using carbon dioxide gas. This resulted in suboptimal visualization. Therefore, arteriography was performed using Visipaque. The middle colloid artery arises proximally from the SMA. There is a visible marginal artery of Drummond extending into the left colloid artery and then the IMA. No definite filling of the aneurysm sac. An echelon 14 microcatheter was then successfully navigated over a Fathom 14 wire into the middle colloid artery. Arteriography confirms the catheter position. The catheter was next navigated into the marginal artery of Drummond. Arteriography again performed confirming catheter position and anatomy. The artery was then advanced into the left colloid artery. Arteriography again confirms catheter position and anatomy. Still no filling of the aneurysm sac. It is difficult to tell if the IMA is occluded or if there is outflow from the aneurysm sac. Finally, after significant effort, the microcatheter was successfully advanced into the SMA. Arteriography was performed. The origin of the SMA is occluded. There is a thin channel leading to the aneurysm sac in an filling the Vasa basal rim. There is no endoleak arising from the IMA. The catheter was removed. The C2 cobra catheter was readvanced through the sheath and into the internal iliac artery. An internal iliac arteriogram was performed. The ascending ileo lumbar artery is successfully identified. No evidence of right-sided lumbar artery endoleak. The C2 catheter was removed. A limited right common femoral arteriogram was performed confirming common femoral  arterial access. Hemostasis was attained with the assistance of a 6 French Angio-Seal device. IMPRESSION: 1. 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 No evidence of type 2 endoleak arising from the IMA, or the right-sided lumbar arteries. Signed, Criselda Peaches, MD [facility] Radiology Electronically Signed   By: Jacqulynn Cadet M.D.   On: 11/24/2017 14:29   Dg Chest Port 1 View  Result Date: 11/24/2017 CLINICAL DATA:  83 year old male with history of shortness of breath. EXAM: PORTABLE CHEST 1 VIEW COMPARISON:  Chest x-ray 09/03/2017. FINDINGS: New area of airspace consolidation in the right mid to lower lung, not obscuring the right hemidiaphragm, potentially within either the right middle or lower lobe. No pleural effusions. Cephalization of pulmonary vasculature, without frank pulmonary edema. Mild diffuse peribronchial cuffing. Heart size is upper limits of normal. Aortic atherosclerosis. IMPRESSION: 1. Diffuse bronchial wall thickening concerning for an acute bronchitis. In addition, there is a new  area of apparent airspace consolidation which could be either within the right middle lobe or right lower lobe, concerning for developing pneumonia. Followup PA and lateral chest X-ray is recommended in 3-4 weeks following trial of antibiotic therapy to ensure resolution and exclude underlying malignancy. 2. Aortic atherosclerosis. Electronically Signed   By: Vinnie Langton M.D.   On: 11/24/2017 11:12    Labs:  CBC: Recent Labs    05/27/17 1530 11/23/17 1953 11/24/17 0623 11/25/17 0501  WBC 2.3* 3.2* 2.4* 4.7  HGB 9.7* 9.2* 8.7* 8.8*  HCT 29.9* 28.3* 27.5* 28.0*  PLT 91* 80* 80* 82*    COAGS: Recent Labs    11/23/17 1830  INR 1.27    BMP: Recent Labs    05/27/17 1530 09/22/17 11/23/17 1953 11/24/17 0623 11/25/17 0501  NA 146 146 139 142 140  K 4.2 4.5 3.6  3.8 3.8  CL 109  --  106 109 108  CO2 28  --  26 27 25   GLUCOSE 124*  --  123* 95 103*  BUN 31* 32* 32* 32* 37*  CALCIUM 8.4*  --  8.4* 8.2* 8.3*  CREATININE 2.82* 2.3* 2.33* 2.44* 2.67*  GFRNONAA  --   --  23* 22* 20*  GFRAA  --   --  27* 26* 23*    LIVER FUNCTION TESTS: Recent Labs    11/23/17 1953  BILITOT 0.6  AST 19  ALT 7*  ALKPHOS 46  PROT 5.7*  ALBUMIN 3.5    Assessment and Plan: 1. S/p mesenteric angio with no evidence of endoleak to repair 2. White out of left lung, likely from mucous plugging 3. CKD  Patient is very tired from coughing so much overnight.  Recent CXR today shows right to left mediastinal shift and white out of the left lung c/w possible mucous plugging.  I have d/w Dr. [name] who has already contacted pulmonary for evaluation for possible bronch. Further plans for endoleak evaluation as outpatient. Cr slightly up to 2.67 today. Greatly appreciate IM assistance and care for this patient.  Electronically Signed: Henreitta Cea 11/25/2017, 11:50 AM   I spent a total of 15 Minutes at the the patient's bedside AND on the patient's hospital floor or unit, greater than 50% of which was counseling/coordinating care for endoleak

## 2017-11-26 ENCOUNTER — Inpatient Hospital Stay (HOSPITAL_COMMUNITY): Payer: Medicare Other

## 2017-11-26 DIAGNOSIS — J9601 Acute respiratory failure with hypoxia: Secondary | ICD-10-CM

## 2017-11-26 LAB — CBC
HCT: 27.5 % — ABNORMAL LOW (ref 39.0–52.0)
Hemoglobin: 8.7 g/dL — ABNORMAL LOW (ref 13.0–17.0)
MCH: 30.7 pg (ref 26.0–34.0)
MCHC: 31.6 g/dL (ref 30.0–36.0)
MCV: 97.2 fL (ref 78.0–100.0)
PLATELETS: 74 10*3/uL — AB (ref 150–400)
RBC: 2.83 MIL/uL — ABNORMAL LOW (ref 4.22–5.81)
RDW: 14.5 % (ref 11.5–15.5)
WBC: 3.6 10*3/uL — AB (ref 4.0–10.5)

## 2017-11-26 LAB — BASIC METABOLIC PANEL
ANION GAP: 10 (ref 5–15)
BUN: 42 mg/dL — ABNORMAL HIGH (ref 6–20)
CALCIUM: 8.2 mg/dL — AB (ref 8.9–10.3)
CO2: 24 mmol/L (ref 22–32)
CREATININE: 2.81 mg/dL — AB (ref 0.61–1.24)
Chloride: 103 mmol/L (ref 101–111)
GFR, EST AFRICAN AMERICAN: 22 mL/min — AB (ref >60)
GFR, EST NON AFRICAN AMERICAN: 19 mL/min — AB (ref >60)
GLUCOSE: 86 mg/dL (ref 65–99)
Potassium: 3.7 mmol/L (ref 3.5–5.1)
Sodium: 137 mmol/L (ref 135–145)

## 2017-11-26 MED ORDER — FUROSEMIDE 10 MG/ML IJ SOLN
60.0000 mg | Freq: Once | INTRAMUSCULAR | Status: AC
  Administered 2017-11-26: 60 mg via INTRAVENOUS
  Filled 2017-11-26: qty 6

## 2017-11-26 NOTE — Progress Notes (Signed)
Supervising Physician: Aletta Edouard  Patient Status: [facility] - In-pt  Chief Complaint: endoleak  Subjective: Patient still looks tired today, but states he thinks he feels better.  Still coughing, but not short of breath  Allergies: Iodinated diagnostic agents; Ioxaglate; and Baclofen  Medications: Prior to Admission medications   Medication Sig Start Date End Date Taking? Authorizing Provider  Carbidopa-Levodopa ER (SINEMET CR) 25-100 MG tablet controlled release Take 0.5 tablets by mouth 2 (two) times daily.   Yes [provider]  Cholecalciferol (VITAMIN D) 2000 units CAPS Take 2,000 Units by mouth daily.   Yes [provider]  docusate sodium (COLACE) 100 MG capsule Take 100 mg by mouth daily.   Yes [provider]  donepezil (ARICEPT) 5 MG tablet Take 5 mg by mouth at bedtime.   Yes [provider]  FeFum-FePo-FA-B Cmp-C-Zn-Mn-Cu (SE-TAN PLUS) 162-115.2-1 MG CAPS Take 1 capsule by mouth daily. Take 1 Tab  daily   Yes [provider]  hydrALAZINE (APRESOLINE) 25 MG tablet Take 25 mg by mouth 3 (three) times daily.   Yes [provider]  hydrochlorothiazide (HYDRODIURIL) 12.5 MG tablet Take 12.5 mg daily by mouth.   Yes [provider]  labetalol (NORMODYNE) 100 MG tablet Take 1 tablet (100 mg total) by mouth 2 (two) times daily. 07/14/15  Yes Paz, Alda Berthold, MD  prednisoLONE acetate (PRED FORTE) 1 % ophthalmic suspension Place 1 drop into both eyes 2 (two) times daily. 11/16/17  Yes [provider]  pregabalin (LYRICA) 50 MG capsule Take 50 mg by mouth at bedtime.    Yes [provider]  sodium polystyrene (KAYEXALATE) 15 GM/60ML suspension Take 15 g by mouth 2 (two) times a week. Take 15 Grams on Monday and Friday   Yes [provider]    Vital Signs: BP 124/60 (BP Location: Right Arm)   Pulse 76   Temp 98.2 F (36.8 C) (Oral)   Resp 18   Ht 5\' 8"  (1.727 m)   Wt 156 lb (70.8 kg)    SpO2 98%   BMI 23.72 kg/m   Physical Exam: Chest: more aeration of left side today, but still somewhat decreased at the base.   Skin: R CFA site is c/d/i  Imaging: Dg Chest 2 View  Result Date: 11/25/2017 CLINICAL DATA:  Cough, shortness of breath. EXAM: CHEST  2 VIEW COMPARISON:  Radiograph of November 24, 2017. FINDINGS: Stable cardiomegaly. Atherosclerosis of thoracic aorta is noted. Significantly increased opacity of the left lung is noted most consistent with atelectasis and associated pleural effusion. Significantly increased right to left midline shift is noted most consistent with volume loss. Hyperexpansion of the right lung is noted with mild right basilar atelectasis. No pneumothorax is noted. Bony thorax is unremarkable. IMPRESSION: Significant right to left mediastinal shift is noted most likely due to atelectasis and volume loss in the left hemithorax, with possible associated pleural effusion. Mild right basilar subsegmental atelectasis is noted. Electronically Signed   By: Marijo Conception, M.D.   On: 11/25/2017 10:21   Ir Angiogram Visceral Selective  Result Date: 11/24/2017 INDICATION: 83 year old male with an enlarging infrarenal abdominal aortic aneurysm despite prior endovascular aortic repair. 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 Patient has significant chronic kidney disease and was admitted yesterday for pre-procedure hydration. He presents now for multi selective angiography and attempted endoleak repair. EXAM: SELECTIVE VISCERAL ARTERIOGRAPHY; ADDITIONAL ARTERIOGRAPHY;  IR ULTRASOUND GUIDANCE VASC ACCESS RIGHT; PELVIC SELECTIVE ARTERIOGRAPHY MEDICATIONS: None ANESTHESIA/SEDATION: Moderate (conscious) sedation was employed during this procedure. A total of Versed 2 mg and Fentanyl 100 mcg was administered intravenously. Moderate Sedation Time: 115 minutes. The patient's level  of consciousness and vital signs were monitored continuously by radiology nursing throughout the procedure under my direct supervision. CONTRAST:  46 mL Visipaque 320 FLUOROSCOPY TIME:  Fluoroscopy Time: 19 minutes 12 seconds (911 mGy). COMPLICATIONS: None immediate. PROCEDURE: Informed consent was obtained from the patient following explanation of the procedure, risks, benefits and alternatives. The patient understands, agrees and consents for the procedure. All questions were addressed. A time out was performed prior to the initiation of the procedure. Maximal barrier sterile technique utilized including caps, mask, sterile gowns, sterile gloves, large sterile drape, hand hygiene, and Betadine prep. The right common femoral artery was interrogated with ultrasound and found to be widely patent. An image was obtained and stored for the medical record. Local anesthesia was attained by infiltration with 1% lidocaine. A small dermatotomy was made. Under real-time sonographic guidance, the vessel was punctured with a 21 gauge micropuncture needle. Using standard technique, the initial micro needle was exchanged over a 0.018 micro wire for a transitional 4 [place] micro sheath. The micro sheath was then exchanged over a 0.035 wire for a 5 French vascular sheath. A C2 cobra catheter was advanced of the abdominal aorta over a Bentson wire in used to select the celiac artery. A celiac arteriogram was performed. Conventional anatomy. No evidence of replaced middle colloid artery. The C2 catheter was next advanced into the superior mesenteric artery. A superior mesenteric arteriogram was performed using carbon dioxide gas. This resulted in suboptimal visualization. Therefore, arteriography was performed using Visipaque. The middle colloid artery arises proximally from the SMA. There is a visible marginal artery of [place] extending into the left colloid artery and then the IMA. No definite filling of the aneurysm sac. An  echelon 14 microcatheter was then successfully navigated over a Fathom 14 wire into the middle colloid artery. Arteriography confirms the catheter position. The catheter was next navigated into the marginal artery of [place]. Arteriography again performed confirming catheter position and anatomy. The artery was then advanced into the left colloid artery. Arteriography again confirms catheter position and anatomy. Still no filling of the aneurysm sac. It is difficult to tell if the IMA is occluded or if there is outflow from the aneurysm sac. Finally, after significant effort, the microcatheter was successfully advanced into the SMA. Arteriography was performed. The origin of the SMA is occluded. There is a thin channel leading to the aneurysm sac in an filling the Vasa basal rim. There is no endoleak arising from the IMA. The catheter was removed. The C2 cobra catheter was readvanced through the sheath and into the internal iliac artery. An internal iliac arteriogram was performed. The ascending ileo lumbar artery is successfully identified. No evidence of right-sided lumbar artery endoleak. The C2 catheter was removed. A limited right common femoral arteriogram was performed confirming common femoral arterial access. Hemostasis was attained with the assistance of a 6 French Angio-Seal device. IMPRESSION: 1. 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 No evidence of type 2 endoleak arising from the IMA, or the right-sided lumbar arteries. Signed, Criselda Peaches, MD [facility] Radiology Electronically Signed   By: Jacqulynn Cadet M.D.   On: 11/24/2017 14:29   Ir Angiogram Visceral Selective  Result  Date: 11/24/2017 INDICATION: 83 year old male with an enlarging infrarenal abdominal aortic aneurysm despite prior endovascular aortic repair. Duplex ultrasound imaging performed in the and office  vascular lab suggested a type 2 endoleak arising from the inferior mesenteric artery. Patient has significant chronic kidney disease and was admitted yesterday for pre-procedure hydration. He presents now for multi selective angiography and attempted endoleak repair. EXAM: SELECTIVE VISCERAL ARTERIOGRAPHY; ADDITIONAL ARTERIOGRAPHY; IR ULTRASOUND GUIDANCE VASC ACCESS RIGHT; PELVIC SELECTIVE ARTERIOGRAPHY MEDICATIONS: None ANESTHESIA/SEDATION: Moderate (conscious) sedation was employed during this procedure. A total of Versed 2 mg and Fentanyl 100 mcg was administered intravenously. Moderate Sedation Time: 115 minutes. The patient's level of consciousness and vital signs were monitored continuously by radiology nursing throughout the procedure under my direct supervision. CONTRAST:  46 mL Visipaque 320 FLUOROSCOPY TIME:  Fluoroscopy Time: 19 minutes 12 seconds (911 mGy). COMPLICATIONS: None immediate. PROCEDURE: Informed consent was obtained from the patient following explanation of the procedure, risks, benefits and alternatives. The patient understands, agrees and consents for the procedure. All questions were addressed. A time out was performed prior to the initiation of the procedure. Maximal barrier sterile technique utilized including caps, mask, sterile gowns, sterile gloves, large sterile drape, hand hygiene, and Betadine prep. The right common femoral artery was interrogated with ultrasound and found to be widely patent. An image was obtained and stored for the medical record. Local anesthesia was attained by infiltration with 1% lidocaine. A small dermatotomy was made. Under real-time sonographic guidance, the vessel was punctured with a 21 gauge micropuncture needle. Using standard technique, the initial micro needle was exchanged over a 0.018 micro wire for a transitional 4 [place] micro sheath. The micro sheath was then exchanged over a 0.035 wire for a 5 French vascular sheath. A C2 cobra catheter was  advanced of the abdominal aorta over a Bentson wire in used to select the celiac artery. A celiac arteriogram was performed. Conventional anatomy. No evidence of replaced middle colloid artery. The C2 catheter was next advanced into the superior mesenteric artery. A superior mesenteric arteriogram was performed using carbon dioxide gas. This resulted in suboptimal visualization. Therefore, arteriography was performed using Visipaque. The middle colloid artery arises proximally from the SMA. There is a visible marginal artery of [place] extending into the left colloid artery and then the IMA. No definite filling of the aneurysm sac. An echelon 14 microcatheter was then successfully navigated over a Fathom 14 wire into the middle colloid artery. Arteriography confirms the catheter position. The catheter was next navigated into the marginal artery of [place]. Arteriography again performed confirming catheter position and anatomy. The artery was then advanced into the left colloid artery. Arteriography again confirms catheter position and anatomy. Still no filling of the aneurysm sac. It is difficult to tell if the IMA is occluded or if there is outflow from the aneurysm sac. Finally, after significant effort, the microcatheter was successfully advanced into the SMA. Arteriography was performed. The origin of the SMA is occluded. There is a thin channel leading to the aneurysm sac in an filling the Vasa basal rim. There is no endoleak arising from the IMA. The catheter was removed. The C2 cobra catheter was readvanced through the sheath and into the internal iliac artery. An internal iliac arteriogram was performed. The ascending ileo lumbar artery is successfully identified. No evidence of right-sided lumbar artery endoleak. The C2 catheter was removed. A limited right common femoral arteriogram was performed confirming common femoral arterial access. Hemostasis was attained with the assistance of a  6 French  Angio-Seal device. IMPRESSION: 1. 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 No evidence of type 2 endoleak arising from the IMA, or the right-sided lumbar arteries. Signed, Criselda Peaches, MD [facility] Radiology Electronically Signed   By: Jacqulynn Cadet M.D.   On: 11/24/2017 14:29   Ir Angiogram Pelvis Selective Or Supraselective  Result Date: 11/24/2017 INDICATION: 83 year old male with an enlarging infrarenal abdominal aortic aneurysm despite prior endovascular aortic repair. 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 Patient has significant chronic kidney disease and was admitted yesterday for pre-procedure hydration. He presents now for multi selective angiography and attempted endoleak repair. EXAM: SELECTIVE VISCERAL ARTERIOGRAPHY; ADDITIONAL ARTERIOGRAPHY; IR ULTRASOUND GUIDANCE VASC ACCESS RIGHT; PELVIC SELECTIVE ARTERIOGRAPHY MEDICATIONS: None ANESTHESIA/SEDATION: Moderate (conscious) sedation was employed during this procedure. A total of Versed 2 mg and Fentanyl 100 mcg was administered intravenously. Moderate Sedation Time: 115 minutes. The patient's level of consciousness and vital signs were monitored continuously by radiology nursing throughout the procedure under my direct supervision. CONTRAST:  46 mL Visipaque 320 FLUOROSCOPY TIME:  Fluoroscopy Time: 19 minutes 12 seconds (911 mGy). COMPLICATIONS: None immediate. PROCEDURE: Informed consent was obtained from the patient following explanation of the procedure, risks, benefits and alternatives. The patient understands, agrees and consents for the procedure. All questions were addressed. A time out was performed prior to the initiation of the procedure. Maximal barrier sterile technique utilized including caps, mask, sterile  gowns, sterile gloves, large sterile drape, hand hygiene, and Betadine prep. The right common femoral artery was interrogated with ultrasound and found to be widely patent. An image was obtained and stored for the medical record. Local anesthesia was attained by infiltration with 1% lidocaine. A small dermatotomy was made. Under real-time sonographic guidance, the vessel was punctured with a 21 gauge micropuncture needle. Using standard technique, the initial micro needle was exchanged over a 0.018 micro wire for a transitional 4 [place] micro sheath. The micro sheath was then exchanged over a 0.035 wire for a 5 French vascular sheath. A C2 cobra catheter was advanced of the abdominal aorta over a Bentson wire in used to select the celiac artery. A celiac arteriogram was performed. Conventional anatomy. No evidence of replaced middle colloid artery. The C2 catheter was next advanced into the superior mesenteric artery. A superior mesenteric arteriogram was performed using carbon dioxide gas. This resulted in suboptimal visualization. Therefore, arteriography was performed using Visipaque. The middle colloid artery arises proximally from the SMA. There is a visible marginal artery of [place] extending into the left colloid artery and then the IMA. No definite filling of the aneurysm sac. An echelon 14 microcatheter was then successfully navigated over a Fathom 14 wire into the middle colloid artery. Arteriography confirms the catheter position. The catheter was next navigated into the marginal artery of [place]. Arteriography again performed confirming catheter position and anatomy. The artery was then advanced into the left colloid artery. Arteriography again confirms catheter position and anatomy. Still no filling of the aneurysm sac. It is difficult to tell if the IMA is occluded or if there is outflow from the aneurysm sac. Finally, after significant effort, the microcatheter was successfully advanced into the  SMA. Arteriography was performed. The origin of the SMA is occluded. There is a thin channel leading to the aneurysm sac in an filling the Vasa basal rim. There is no  endoleak arising from the IMA. The catheter was removed. The C2 cobra catheter was readvanced through the sheath and into the internal iliac artery. An internal iliac arteriogram was performed. The ascending ileo lumbar artery is successfully identified. No evidence of right-sided lumbar artery endoleak. The C2 catheter was removed. A limited right common femoral arteriogram was performed confirming common femoral arterial access. Hemostasis was attained with the assistance of a 6 French Angio-Seal device. IMPRESSION: 1. 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 No evidence of type 2 endoleak arising from the IMA, or the right-sided lumbar arteries. Signed, Criselda Peaches, MD [facility] Radiology Electronically Signed   By: Jacqulynn Cadet M.D.   On: 11/24/2017 14:29   Ir Angiogram Selective Each Additional Vessel  Result Date: 11/24/2017 INDICATION: 83 year old male with an enlarging infrarenal abdominal aortic aneurysm despite prior endovascular aortic repair. 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 Patient has significant chronic kidney disease and was admitted yesterday for pre-procedure hydration. He presents now for multi selective angiography and attempted endoleak repair. EXAM: SELECTIVE VISCERAL ARTERIOGRAPHY; ADDITIONAL ARTERIOGRAPHY; IR ULTRASOUND GUIDANCE VASC ACCESS RIGHT; PELVIC SELECTIVE ARTERIOGRAPHY MEDICATIONS: None ANESTHESIA/SEDATION: Moderate (conscious) sedation was employed during this procedure. A total of Versed 2 mg and Fentanyl 100 mcg was administered intravenously. Moderate Sedation Time: 115 minutes.  The patient's level of consciousness and vital signs were monitored continuously by radiology nursing throughout the procedure under my direct supervision. CONTRAST:  46 mL Visipaque 320 FLUOROSCOPY TIME:  Fluoroscopy Time: 19 minutes 12 seconds (911 mGy). COMPLICATIONS: None immediate. PROCEDURE: Informed consent was obtained from the patient following explanation of the procedure, risks, benefits and alternatives. The patient understands, agrees and consents for the procedure. All questions were addressed. A time out was performed prior to the initiation of the procedure. Maximal barrier sterile technique utilized including caps, mask, sterile gowns, sterile gloves, large sterile drape, hand hygiene, and Betadine prep. The right common femoral artery was interrogated with ultrasound and found to be widely patent. An image was obtained and stored for the medical record. Local anesthesia was attained by infiltration with 1% lidocaine. A small dermatotomy was made. Under real-time sonographic guidance, the vessel was punctured with a 21 gauge micropuncture needle. Using standard technique, the initial micro needle was exchanged over a 0.018 micro wire for a transitional 4 [place] micro sheath. The micro sheath was then exchanged over a 0.035 wire for a 5 French vascular sheath. A C2 cobra catheter was advanced of the abdominal aorta over a Bentson wire in used to select the celiac artery. A celiac arteriogram was performed. Conventional anatomy. No evidence of replaced middle colloid artery. The C2 catheter was next advanced into the superior mesenteric artery. A superior mesenteric arteriogram was performed using carbon dioxide gas. This resulted in suboptimal visualization. Therefore, arteriography was performed using Visipaque. The middle colloid artery arises proximally from the SMA. There is a visible marginal artery of [place] extending into the left colloid artery and then the IMA. No definite filling of the  aneurysm sac. An echelon 14 microcatheter was then successfully navigated over a Fathom 14 wire into the middle colloid artery. Arteriography confirms the catheter position. The catheter was next navigated into the marginal artery of [place]. Arteriography again performed confirming catheter position and anatomy. The artery was then advanced into the left colloid artery. Arteriography again confirms catheter position  and anatomy. Still no filling of the aneurysm sac. It is difficult to tell if the IMA is occluded or if there is outflow from the aneurysm sac. Finally, after significant effort, the microcatheter was successfully advanced into the SMA. Arteriography was performed. The origin of the SMA is occluded. There is a thin channel leading to the aneurysm sac in an filling the Vasa basal rim. There is no endoleak arising from the IMA. The catheter was removed. The C2 cobra catheter was readvanced through the sheath and into the internal iliac artery. An internal iliac arteriogram was performed. The ascending ileo lumbar artery is successfully identified. No evidence of right-sided lumbar artery endoleak. The C2 catheter was removed. A limited right common femoral arteriogram was performed confirming common femoral arterial access. Hemostasis was attained with the assistance of a 6 French Angio-Seal device. IMPRESSION: 1. 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 No evidence of type 2 endoleak arising from the IMA, or the right-sided lumbar arteries. Signed, Criselda Peaches, MD [facility] Radiology Electronically Signed   By: Jacqulynn Cadet M.D.   On: 11/24/2017 14:29   Ir Angiogram Selective Each Additional Vessel  Result Date: 11/24/2017 INDICATION: 84 year old male with an enlarging infrarenal abdominal aortic aneurysm despite prior endovascular aortic repair. 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 Patient has significant chronic kidney disease and was admitted yesterday for pre-procedure hydration. He presents now for multi selective angiography and attempted endoleak repair. EXAM: SELECTIVE VISCERAL ARTERIOGRAPHY; ADDITIONAL ARTERIOGRAPHY; IR ULTRASOUND GUIDANCE VASC ACCESS RIGHT; PELVIC SELECTIVE ARTERIOGRAPHY MEDICATIONS: None ANESTHESIA/SEDATION: Moderate (conscious) sedation was employed during this procedure. A total of Versed 2 mg and Fentanyl 100 mcg was administered intravenously. Moderate Sedation Time: 115 minutes. The patient's level of consciousness and vital signs were monitored continuously by radiology nursing throughout the procedure under my direct supervision. CONTRAST:  46 mL Visipaque 320 FLUOROSCOPY TIME:  Fluoroscopy Time: 19 minutes 12 seconds (911 mGy). COMPLICATIONS: None immediate. PROCEDURE: Informed consent was obtained from the patient following explanation of the procedure, risks, benefits and alternatives. The patient understands, agrees and consents for the procedure. All questions were addressed. A time out was performed prior to the initiation of the procedure. Maximal barrier sterile technique utilized including caps, mask, sterile gowns, sterile gloves, large sterile drape, hand hygiene, and Betadine prep. The right common femoral artery was interrogated with ultrasound and found to be widely patent. An image was obtained and stored for the medical record. Local anesthesia was attained by infiltration with 1% lidocaine. A small dermatotomy was made. Under real-time sonographic guidance, the vessel was punctured with a 21 gauge micropuncture needle. Using standard technique, the initial micro needle was exchanged over a 0.018 micro wire for a transitional 4 [place] micro sheath. The micro sheath was then exchanged over a 0.035 wire for a 5 French vascular sheath.  A C2 cobra catheter was advanced of the abdominal aorta over a Bentson wire in used to select the celiac artery. A celiac arteriogram was performed. Conventional anatomy. No evidence of replaced middle colloid artery. The C2 catheter was next advanced into the superior mesenteric artery. A superior mesenteric arteriogram was performed using carbon dioxide gas. This resulted in suboptimal visualization. Therefore, arteriography was performed using Visipaque. The middle colloid artery arises proximally from the SMA. There is a visible marginal artery of [place] extending into the left  colloid artery and then the IMA. No definite filling of the aneurysm sac. An echelon 14 microcatheter was then successfully navigated over a Fathom 14 wire into the middle colloid artery. Arteriography confirms the catheter position. The catheter was next navigated into the marginal artery of [place]. Arteriography again performed confirming catheter position and anatomy. The artery was then advanced into the left colloid artery. Arteriography again confirms catheter position and anatomy. Still no filling of the aneurysm sac. It is difficult to tell if the IMA is occluded or if there is outflow from the aneurysm sac. Finally, after significant effort, the microcatheter was successfully advanced into the SMA. Arteriography was performed. The origin of the SMA is occluded. There is a thin channel leading to the aneurysm sac in an filling the Vasa basal rim. There is no endoleak arising from the IMA. The catheter was removed. The C2 cobra catheter was readvanced through the sheath and into the internal iliac artery. An internal iliac arteriogram was performed. The ascending ileo lumbar artery is successfully identified. No evidence of right-sided lumbar artery endoleak. The C2 catheter was removed. A limited right common femoral arteriogram was performed confirming common femoral arterial access. Hemostasis was attained with the  assistance of a 6 French Angio-Seal device. IMPRESSION: 1. 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 No evidence of type 2 endoleak arising from the IMA, or the right-sided lumbar arteries. Signed, Criselda Peaches, MD [facility] Radiology Electronically Signed   By: Jacqulynn Cadet M.D.   On: 11/24/2017 14:29   Ir Angiogram Selective Each Additional Vessel  Result Date: 11/24/2017 INDICATION: 83 year old male with an enlarging infrarenal abdominal aortic aneurysm despite prior endovascular aortic repair. 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 Patient has significant chronic kidney disease and was admitted yesterday for pre-procedure hydration. He presents now for multi selective angiography and attempted endoleak repair. EXAM: SELECTIVE VISCERAL ARTERIOGRAPHY; ADDITIONAL ARTERIOGRAPHY; IR ULTRASOUND GUIDANCE VASC ACCESS RIGHT; PELVIC SELECTIVE ARTERIOGRAPHY MEDICATIONS: None ANESTHESIA/SEDATION: Moderate (conscious) sedation was employed during this procedure. A total of Versed 2 mg and Fentanyl 100 mcg was administered intravenously. Moderate Sedation Time: 115 minutes. The patient's level of consciousness and vital signs were monitored continuously by radiology nursing throughout the procedure under my direct supervision. CONTRAST:  46 mL Visipaque 320 FLUOROSCOPY TIME:  Fluoroscopy Time: 19 minutes 12 seconds (911 mGy). COMPLICATIONS: None immediate. PROCEDURE: Informed consent was obtained from the patient following explanation of the procedure, risks, benefits and alternatives. The patient understands, agrees and consents for the procedure. All questions were addressed. A time out was performed prior to the initiation of the procedure. Maximal barrier sterile technique utilized including  caps, mask, sterile gowns, sterile gloves, large sterile drape, hand hygiene, and Betadine prep. The right common femoral artery was interrogated with ultrasound and found to be widely patent. An image was obtained and stored for the medical record. Local anesthesia was attained by infiltration with 1% lidocaine. A small dermatotomy was made. Under real-time sonographic guidance, the vessel was punctured with a 21 gauge micropuncture needle. Using standard technique, the initial micro needle was exchanged over a 0.018 micro wire for a transitional 4 [place] micro sheath. The micro sheath was then exchanged over a 0.035 wire for a 5 French vascular sheath. A C2 cobra catheter was advanced of the abdominal aorta over a Bentson wire in used to select  the celiac artery. A celiac arteriogram was performed. Conventional anatomy. No evidence of replaced middle colloid artery. The C2 catheter was next advanced into the superior mesenteric artery. A superior mesenteric arteriogram was performed using carbon dioxide gas. This resulted in suboptimal visualization. Therefore, arteriography was performed using Visipaque. The middle colloid artery arises proximally from the SMA. There is a visible marginal artery of [place] extending into the left colloid artery and then the IMA. No definite filling of the aneurysm sac. An echelon 14 microcatheter was then successfully navigated over a Fathom 14 wire into the middle colloid artery. Arteriography confirms the catheter position. The catheter was next navigated into the marginal artery of [place]. Arteriography again performed confirming catheter position and anatomy. The artery was then advanced into the left colloid artery. Arteriography again confirms catheter position and anatomy. Still no filling of the aneurysm sac. It is difficult to tell if the IMA is occluded or if there is outflow from the aneurysm sac. Finally, after significant effort, the microcatheter was successfully  advanced into the SMA. Arteriography was performed. The origin of the SMA is occluded. There is a thin channel leading to the aneurysm sac in an filling the Vasa basal rim. There is no endoleak arising from the IMA. The catheter was removed. The C2 cobra catheter was readvanced through the sheath and into the internal iliac artery. An internal iliac arteriogram was performed. The ascending ileo lumbar artery is successfully identified. No evidence of right-sided lumbar artery endoleak. The C2 catheter was removed. A limited right common femoral arteriogram was performed confirming common femoral arterial access. Hemostasis was attained with the assistance of a 6 French Angio-Seal device. IMPRESSION: 1. 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 No evidence of type 2 endoleak arising from the IMA, or the right-sided lumbar arteries. Signed, Criselda Peaches, MD [facility] Radiology Electronically Signed   By: Jacqulynn Cadet M.D.   On: 11/24/2017 14:29   Ir Angiogram Selective Each Additional Vessel  Result Date: 11/24/2017 INDICATION: 83 year old male with an enlarging infrarenal abdominal aortic aneurysm despite prior endovascular aortic repair. 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 Patient has significant chronic kidney disease and was admitted yesterday for pre-procedure hydration. He presents now for multi selective angiography and attempted endoleak repair. EXAM: SELECTIVE VISCERAL ARTERIOGRAPHY; ADDITIONAL ARTERIOGRAPHY; IR ULTRASOUND GUIDANCE VASC ACCESS RIGHT; PELVIC SELECTIVE ARTERIOGRAPHY MEDICATIONS: None ANESTHESIA/SEDATION: Moderate (conscious) sedation was employed during this procedure. A total of Versed 2 mg and Fentanyl 100 mcg was administered intravenously. Moderate Sedation  Time: 115 minutes. The patient's level of consciousness and vital signs were monitored continuously by radiology nursing throughout the procedure under my direct supervision. CONTRAST:  46 mL Visipaque 320 FLUOROSCOPY TIME:  Fluoroscopy Time: 19 minutes 12 seconds (911 mGy). COMPLICATIONS: None immediate. PROCEDURE: Informed consent was obtained from the patient following explanation of the procedure, risks, benefits and alternatives. The patient understands, agrees and consents for the procedure. All questions were addressed. A time out was performed prior to the initiation of the procedure. Maximal barrier sterile technique utilized including caps, mask, sterile gowns, sterile gloves, large sterile drape, hand hygiene, and Betadine prep. The right common femoral artery was interrogated with ultrasound and found to be widely patent. An image was obtained and stored for the medical record. Local anesthesia was attained by infiltration with 1% lidocaine. A small dermatotomy was  made. Under real-time sonographic guidance, the vessel was punctured with a 21 gauge micropuncture needle. Using standard technique, the initial micro needle was exchanged over a 0.018 micro wire for a transitional 4 [place] micro sheath. The micro sheath was then exchanged over a 0.035 wire for a 5 French vascular sheath. A C2 cobra catheter was advanced of the abdominal aorta over a Bentson wire in used to select the celiac artery. A celiac arteriogram was performed. Conventional anatomy. No evidence of replaced middle colloid artery. The C2 catheter was next advanced into the superior mesenteric artery. A superior mesenteric arteriogram was performed using carbon dioxide gas. This resulted in suboptimal visualization. Therefore, arteriography was performed using Visipaque. The middle colloid artery arises proximally from the SMA. There is a visible marginal artery of [place] extending into the left colloid artery and then the IMA. No  definite filling of the aneurysm sac. An echelon 14 microcatheter was then successfully navigated over a Fathom 14 wire into the middle colloid artery. Arteriography confirms the catheter position. The catheter was next navigated into the marginal artery of [place]. Arteriography again performed confirming catheter position and anatomy. The artery was then advanced into the left colloid artery. Arteriography again confirms catheter position and anatomy. Still no filling of the aneurysm sac. It is difficult to tell if the IMA is occluded or if there is outflow from the aneurysm sac. Finally, after significant effort, the microcatheter was successfully advanced into the SMA. Arteriography was performed. The origin of the SMA is occluded. There is a thin channel leading to the aneurysm sac in an filling the Vasa basal rim. There is no endoleak arising from the IMA. The catheter was removed. The C2 cobra catheter was readvanced through the sheath and into the internal iliac artery. An internal iliac arteriogram was performed. The ascending ileo lumbar artery is successfully identified. No evidence of right-sided lumbar artery endoleak. The C2 catheter was removed. A limited right common femoral arteriogram was performed confirming common femoral arterial access. Hemostasis was attained with the assistance of a 6 French Angio-Seal device. IMPRESSION: 1. 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 No evidence of type 2 endoleak arising from the IMA, or the right-sided lumbar arteries. Signed, Criselda Peaches, MD [facility] Radiology Electronically Signed   By: Jacqulynn Cadet M.D.   On: 11/24/2017 14:29   Ir US Guide Vasc Access Right  Result Date: 11/24/2017 INDICATION: 83 year old male with an enlarging infrarenal abdominal aortic aneurysm despite prior endovascular aortic repair. Duplex ultrasound  imaging performed in the and office vascular lab suggested a type 2 endoleak arising from the inferior mesenteric artery. Patient has significant chronic kidney disease and was admitted yesterday for pre-procedure hydration. He presents now for multi selective angiography and attempted endoleak repair. EXAM: SELECTIVE VISCERAL ARTERIOGRAPHY; ADDITIONAL ARTERIOGRAPHY; IR ULTRASOUND GUIDANCE VASC ACCESS RIGHT; PELVIC SELECTIVE ARTERIOGRAPHY MEDICATIONS: None ANESTHESIA/SEDATION: Moderate (conscious) sedation was employed during this procedure. A total of Versed 2 mg and Fentanyl 100 mcg was administered intravenously. Moderate Sedation Time: 115 minutes. The patient's level of consciousness and vital signs were monitored continuously by radiology nursing throughout the procedure under my direct supervision. CONTRAST:  46 mL Visipaque 320 FLUOROSCOPY TIME:  Fluoroscopy Time: 19 minutes 12 seconds (911 mGy). COMPLICATIONS: None immediate. PROCEDURE: Informed consent was obtained from the patient following explanation of the procedure, risks, benefits and alternatives. The patient understands, agrees and consents for the procedure. All questions were addressed.  A time out was performed prior to the initiation of the procedure. Maximal barrier sterile technique utilized including caps, mask, sterile gowns, sterile gloves, large sterile drape, hand hygiene, and Betadine prep. The right common femoral artery was interrogated with ultrasound and found to be widely patent. An image was obtained and stored for the medical record. Local anesthesia was attained by infiltration with 1% lidocaine. A small dermatotomy was made. Under real-time sonographic guidance, the vessel was punctured with a 21 gauge micropuncture needle. Using standard technique, the initial micro needle was exchanged over a 0.018 micro wire for a transitional 4 [place] micro sheath. The micro sheath was then exchanged over a 0.035 wire for a 5 French vascular  sheath. A C2 cobra catheter was advanced of the abdominal aorta over a Bentson wire in used to select the celiac artery. A celiac arteriogram was performed. Conventional anatomy. No evidence of replaced middle colloid artery. The C2 catheter was next advanced into the superior mesenteric artery. A superior mesenteric arteriogram was performed using carbon dioxide gas. This resulted in suboptimal visualization. Therefore, arteriography was performed using Visipaque. The middle colloid artery arises proximally from the SMA. There is a visible marginal artery of [place] extending into the left colloid artery and then the IMA. No definite filling of the aneurysm sac. An echelon 14 microcatheter was then successfully navigated over a Fathom 14 wire into the middle colloid artery. Arteriography confirms the catheter position. The catheter was next navigated into the marginal artery of [place]. Arteriography again performed confirming catheter position and anatomy. The artery was then advanced into the left colloid artery. Arteriography again confirms catheter position and anatomy. Still no filling of the aneurysm sac. It is difficult to tell if the IMA is occluded or if there is outflow from the aneurysm sac. Finally, after significant effort, the microcatheter was successfully advanced into the SMA. Arteriography was performed. The origin of the SMA is occluded. There is a thin channel leading to the aneurysm sac in an filling the Vasa basal rim. There is no endoleak arising from the IMA. The catheter was removed. The C2 cobra catheter was readvanced through the sheath and into the internal iliac artery. An internal iliac arteriogram was performed. The ascending ileo lumbar artery is successfully identified. No evidence of right-sided lumbar artery endoleak. The C2 catheter was removed. A limited right common femoral arteriogram was performed confirming common femoral arterial access. Hemostasis was attained with the  assistance of a 6 French Angio-Seal device. IMPRESSION: 1. 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 No evidence of type 2 endoleak arising from the IMA, or the right-sided lumbar arteries. Signed, Criselda Peaches, MD [facility] Radiology Electronically Signed   By: Jacqulynn Cadet M.D.   On: 11/24/2017 14:29   Dg Chest Port 1 View  Result Date: 11/26/2017 CLINICAL DATA:  Atelectasis EXAM: PORTABLE CHEST 1 VIEW COMPARISON:  11/25/2017 FINDINGS: Cardiomegaly with vascular congestion. Bilateral perihilar and lower lobe opacities likely reflect edema with small effusions. Improved aeration on the left since prior study. IMPRESSION: Cardiomegaly with bilateral perihilar and lower lobe opacities, likely edema/ CHF. Suspect small effusions. Improved aeration on the left. Electronically Signed   By: Rolm Baptise M.D.   On: 11/26/2017 07:38   Dg Chest Port 1 View  Result Date: 11/24/2017 CLINICAL DATA:  83 year old male with history of shortness of breath. EXAM: PORTABLE CHEST 1 VIEW COMPARISON:  Chest x-ray 09/03/2017. FINDINGS: New area  of airspace consolidation in the right mid to lower lung, not obscuring the right hemidiaphragm, potentially within either the right middle or lower lobe. No pleural effusions. Cephalization of pulmonary vasculature, without frank pulmonary edema. Mild diffuse peribronchial cuffing. Heart size is upper limits of normal. Aortic atherosclerosis. IMPRESSION: 1. Diffuse bronchial wall thickening concerning for an acute bronchitis. In addition, there is a new area of apparent airspace consolidation which could be either within the right middle lobe or right lower lobe, concerning for developing pneumonia. Followup PA and lateral chest X-ray is recommended in 3-4 weeks following trial of antibiotic therapy to ensure resolution and exclude underlying malignancy. 2.  Aortic atherosclerosis. Electronically Signed   By: Vinnie Langton M.D.   On: 11/24/2017 11:12    Labs:  CBC: Recent Labs    11/23/17 1953 11/24/17 0623 11/25/17 0501 11/26/17 0523  WBC 3.2* 2.4* 4.7 3.6*  HGB 9.2* 8.7* 8.8* 8.7*  HCT 28.3* 27.5* 28.0* 27.5*  PLT 80* 80* 82* 74*    COAGS: Recent Labs    11/23/17 1830  INR 1.27    BMP: Recent Labs    11/23/17 1953 11/24/17 0623 11/25/17 0501 11/26/17 0523  NA 139 142 140 137  K 3.6 3.8 3.8 3.7  CL 106 109 108 103  CO2 26 27 25 24   GLUCOSE 123* 95 103* 86  BUN 32* 32* 37* 42*  CALCIUM 8.4* 8.2* 8.3* 8.2*  CREATININE 2.33* 2.44* 2.67* 2.81*  GFRNONAA 23* 22* 20* 19*  GFRAA 27* 26* 23* 22*    LIVER FUNCTION TESTS: Recent Labs    11/23/17 1953  BILITOT 0.6  AST 19  ALT 7*  ALKPHOS 46  PROT 5.7*  ALBUMIN 3.5    Assessment and Plan: 1. S/p mesenteric angio with no evidence of endoleak to repair  Patient seems better today, but still looks deconditioned.  Son in law is wanting patient to mobilize.  Will defer pulmonary issues to primary service and pulmonary.  Outpatient plans arranged already for further IR intervention.  Electronically Signed: Henreitta Cea 11/26/2017, 11:51 AM   I spent a total of 15 Minutes at the the patient's bedside AND on the patient's hospital floor or unit, greater than 50% of which was counseling/coordinating care for endoleak

## 2017-11-26 NOTE — Progress Notes (Signed)
[facility] PROGRESS NOTE  [name]:[id number] DOB: [birth year] DOA: 11/23/2017  PCP: Colon Branch, MD  Brief History/Interval Summary: 83 year old Caucasian male with a past medical history of AAA with prior endovascular repair, history of renal cell carcinoma with solitary left kidney, history of hypertension, COPD was sent over from his nephrologist for evaluation of possible AAA endoleak.  Patient was referred to interventional radiology who recommended admission to the hospital.  Patient subsequently developed hypoxia.  Imaging studies suggested interstitial edema left-sided mucous plugging.  Pulmonology and nephrology consulted.  Reason for Visit: Acute respiratory failure with hypoxia  Consultants: Interventional radiology.  Pulmonology.  Nephrology.  Procedures: Selective angiography which did not reveal any AAA endoleak.  Antibiotics: None  Subjective/Interval History: Patient is hard of hearing.  Today his son-in-law is at the bedside.  Patient denies any complaints.  No chest pain or shortness of breath.  Feels better overall.    ROS: Denies any headaches.  Objective:  Vital Signs  Vitals:   11/25/17 1625 11/25/17 1733 11/26/17 0605 11/26/17 0835  BP:  135/62 (!) 122/58   Pulse:  68 79   Resp:  16 16   Temp:  99.2 F (37.3 C) 98 F (36.7 C)   TempSrc:  Oral Oral   SpO2: 96% 95% 97% 97%  Weight:      Height:        Intake/Output Summary (Last 24 hours) at 11/26/2017 1028 Last data filed at 11/26/2017 0600 Gross per 24 hour  Intake 120 ml  Output 700 ml  Net -580 ml   Filed Weights   11/24/17 1020 11/24/17 1400 11/24/17 1918  Weight: 72.6 kg (160 lb) 73.7 kg (162 lb 7.7 oz) 71.2 kg (157 lb)    General appearance: Awake alert.  In no distress. Resp: Aeration has improved in the left side.  Continues to have crackles bilaterally.  No wheezing.  Normal effort.   Cardio: S1-S2 is normal regular.  No S3-S4.  No rubs murmurs GI: Abdomen  remains soft.  Nontender nondistended.  Bowel sounds are present.  No masses organomegaly.   Extremities: Edema has improved in bilateral lower extremities. Neurologic:  No focal neurological deficit  Lab Results:  Data Reviewed: I have personally reviewed following labs and imaging studies  CBC: Recent Labs  Lab 11/23/17 1953 11/24/17 0623 11/25/17 0501 11/26/17 0523  WBC 3.2* 2.4* 4.7 3.6*  HGB 9.2* 8.7* 8.8* 8.7*  HCT 28.3* 27.5* 28.0* 27.5*  MCV 95.3 97.9 96.9 97.2  PLT 80* 80* 82* 74*    Basic Metabolic Panel: Recent Labs  Lab 11/23/17 1953 11/24/17 0623 11/25/17 0501 11/26/17 0523  NA 139 142 140 137  K 3.6 3.8 3.8 3.7  CL 106 109 108 103  CO2 26 27 25 24   GLUCOSE 123* 95 103* 86  BUN 32* 32* 37* 42*  CREATININE 2.33* 2.44* 2.67* 2.81*  CALCIUM 8.4* 8.2* 8.3* 8.2*    GFR: Estimated Creatinine Clearance: 17.6 mL/min (A) (by C-G formula based on SCr of 2.81 mg/dL (H)).  Liver Function Tests: Recent Labs  Lab 11/23/17 1953  AST 19  ALT 7*  ALKPHOS 46  BILITOT 0.6  PROT 5.7*  ALBUMIN 3.5     Coagulation Profile: Recent Labs  Lab 11/23/17 1830  INR 1.27      Radiology Studies: Dg Chest 2 View  Result Date: 11/25/2017 CLINICAL DATA:  Cough, shortness of breath. EXAM: CHEST  2 VIEW COMPARISON:  Radiograph of November 24, 2017. FINDINGS:  Stable cardiomegaly. Atherosclerosis of thoracic aorta is noted. Significantly increased opacity of the left lung is noted most consistent with atelectasis and associated pleural effusion. Significantly increased right to left midline shift is noted most consistent with volume loss. Hyperexpansion of the right lung is noted with mild right basilar atelectasis. No pneumothorax is noted. Bony thorax is unremarkable. IMPRESSION: Significant right to left mediastinal shift is noted most likely due to atelectasis and volume loss in the left hemithorax, with possible associated pleural effusion. Mild right basilar  subsegmental atelectasis is noted. Electronically Signed   By: Marijo Conception, M.D.   On: 11/25/2017 10:21   Ir Angiogram Visceral Selective  Result Date: 11/24/2017 INDICATION: 83 year old male with an enlarging infrarenal abdominal aortic aneurysm despite prior endovascular aortic repair. 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 Patient has significant chronic kidney disease and was admitted yesterday for pre-procedure hydration. He presents now for multi selective angiography and attempted endoleak repair. EXAM: SELECTIVE VISCERAL ARTERIOGRAPHY; ADDITIONAL ARTERIOGRAPHY; IR ULTRASOUND GUIDANCE VASC ACCESS RIGHT; PELVIC SELECTIVE ARTERIOGRAPHY MEDICATIONS: None ANESTHESIA/SEDATION: Moderate (conscious) sedation was employed during this procedure. A total of Versed 2 mg and Fentanyl 100 mcg was administered intravenously. Moderate Sedation Time: 115 minutes. The patient's level of consciousness and vital signs were monitored continuously by radiology nursing throughout the procedure under my direct supervision. CONTRAST:  46 mL Visipaque 320 FLUOROSCOPY TIME:  Fluoroscopy Time: 19 minutes 12 seconds (911 mGy). COMPLICATIONS: None immediate. PROCEDURE: Informed consent was obtained from the patient following explanation of the procedure, risks, benefits and alternatives. The patient understands, agrees and consents for the procedure. All questions were addressed. A time out was performed prior to the initiation of the procedure. Maximal barrier sterile technique utilized including caps, mask, sterile gowns, sterile gloves, large sterile drape, hand hygiene, and Betadine prep. The right common femoral artery was interrogated with ultrasound and found to be widely patent. An image was obtained and stored for the medical record. Local anesthesia was attained by infiltration with 1% lidocaine. A small dermatotomy was  made. Under real-time sonographic guidance, the vessel was punctured with a 21 gauge micropuncture needle. Using standard technique, the initial micro needle was exchanged over a 0.018 micro wire for a transitional 4 [place] micro sheath. The micro sheath was then exchanged over a 0.035 wire for a 5 French vascular sheath. A C2 cobra catheter was advanced of the abdominal aorta over a Bentson wire in used to select the celiac artery. A celiac arteriogram was performed. Conventional anatomy. No evidence of replaced middle colloid artery. The C2 catheter was next advanced into the superior mesenteric artery. A superior mesenteric arteriogram was performed using carbon dioxide gas. This resulted in suboptimal visualization. Therefore, arteriography was performed using Visipaque. The middle colloid artery arises proximally from the SMA. There is a visible marginal artery of [place] extending into the left colloid artery and then the IMA. No definite filling of the aneurysm sac. An echelon 14 microcatheter was then successfully navigated over a Fathom 14 wire into the middle colloid artery. Arteriography confirms the catheter position. The catheter was next navigated into the marginal artery of [place]. Arteriography again performed confirming catheter position and anatomy. The artery was then advanced into the left colloid artery. Arteriography again confirms catheter position and anatomy. Still no filling of the aneurysm sac. It is difficult to tell if the IMA is occluded or if there is outflow from the  aneurysm sac. Finally, after significant effort, the microcatheter was successfully advanced into the SMA. Arteriography was performed. The origin of the SMA is occluded. There is a thin channel leading to the aneurysm sac in an filling the Vasa basal rim. There is no endoleak arising from the IMA. The catheter was removed. The C2 cobra catheter was readvanced through the sheath and into the internal iliac artery. An  internal iliac arteriogram was performed. The ascending ileo lumbar artery is successfully identified. No evidence of right-sided lumbar artery endoleak. The C2 catheter was removed. A limited right common femoral arteriogram was performed confirming common femoral arterial access. Hemostasis was attained with the assistance of a 6 French Angio-Seal device. IMPRESSION: 1. 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 No evidence of type 2 endoleak arising from the IMA, or the right-sided lumbar arteries. Signed, Criselda Peaches, MD [facility] Radiology Electronically Signed   By: Jacqulynn Cadet M.D.   On: 11/24/2017 14:29   Ir Angiogram Visceral Selective  Result Date: 11/24/2017 INDICATION: 83 year old male with an enlarging infrarenal abdominal aortic aneurysm despite prior endovascular aortic repair. 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 Patient has significant chronic kidney disease and was admitted yesterday for pre-procedure hydration. He presents now for multi selective angiography and attempted endoleak repair. EXAM: SELECTIVE VISCERAL ARTERIOGRAPHY; ADDITIONAL ARTERIOGRAPHY; IR ULTRASOUND GUIDANCE VASC ACCESS RIGHT; PELVIC SELECTIVE ARTERIOGRAPHY MEDICATIONS: None ANESTHESIA/SEDATION: Moderate (conscious) sedation was employed during this procedure. A total of Versed 2 mg and Fentanyl 100 mcg was administered intravenously. Moderate Sedation Time: 115 minutes. The patient's level of consciousness and vital signs were monitored continuously by radiology nursing throughout the procedure under my direct supervision. CONTRAST:  46 mL Visipaque 320 FLUOROSCOPY TIME:  Fluoroscopy Time: 19 minutes 12 seconds (911 mGy). COMPLICATIONS: None immediate. PROCEDURE: Informed consent was obtained from  the patient following explanation of the procedure, risks, benefits and alternatives. The patient understands, agrees and consents for the procedure. All questions were addressed. A time out was performed prior to the initiation of the procedure. Maximal barrier sterile technique utilized including caps, mask, sterile gowns, sterile gloves, large sterile drape, hand hygiene, and Betadine prep. The right common femoral artery was interrogated with ultrasound and found to be widely patent. An image was obtained and stored for the medical record. Local anesthesia was attained by infiltration with 1% lidocaine. A small dermatotomy was made. Under real-time sonographic guidance, the vessel was punctured with a 21 gauge micropuncture needle. Using standard technique, the initial micro needle was exchanged over a 0.018 micro wire for a transitional 4 [place] micro sheath. The micro sheath was then exchanged over a 0.035 wire for a 5 French vascular sheath. A C2 cobra catheter was advanced of the abdominal aorta over a Bentson wire in used to select the celiac artery. A celiac arteriogram was performed. Conventional anatomy. No evidence of replaced middle colloid artery. The C2 catheter was next advanced into the superior mesenteric artery. A superior mesenteric arteriogram was performed using carbon dioxide gas. This resulted in suboptimal visualization. Therefore, arteriography was performed using Visipaque. The middle colloid artery arises proximally from the SMA. There is a visible marginal artery of [place] extending into the left colloid artery and then the IMA. No definite filling of the aneurysm sac. An echelon 14 microcatheter was then successfully navigated over a Fathom 14 wire into the  middle colloid artery. Arteriography confirms the catheter position. The catheter was next navigated into the marginal artery of [place]. Arteriography again performed confirming catheter position and anatomy. The artery was then  advanced into the left colloid artery. Arteriography again confirms catheter position and anatomy. Still no filling of the aneurysm sac. It is difficult to tell if the IMA is occluded or if there is outflow from the aneurysm sac. Finally, after significant effort, the microcatheter was successfully advanced into the SMA. Arteriography was performed. The origin of the SMA is occluded. There is a thin channel leading to the aneurysm sac in an filling the Vasa basal rim. There is no endoleak arising from the IMA. The catheter was removed. The C2 cobra catheter was readvanced through the sheath and into the internal iliac artery. An internal iliac arteriogram was performed. The ascending ileo lumbar artery is successfully identified. No evidence of right-sided lumbar artery endoleak. The C2 catheter was removed. A limited right common femoral arteriogram was performed confirming common femoral arterial access. Hemostasis was attained with the assistance of a 6 French Angio-Seal device. IMPRESSION: 1. 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 No evidence of type 2 endoleak arising from the IMA, or the right-sided lumbar arteries. Signed, Criselda Peaches, MD [facility] Radiology Electronically Signed   By: Jacqulynn Cadet M.D.   On: 11/24/2017 14:29   Ir Angiogram Pelvis Selective Or Supraselective  Result Date: 11/24/2017 INDICATION: 83 year old male with an enlarging infrarenal abdominal aortic aneurysm despite prior endovascular aortic repair. 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 Patient has significant chronic kidney disease and was admitted yesterday for pre-procedure hydration. He presents now for multi selective angiography and attempted endoleak repair. EXAM: SELECTIVE VISCERAL  ARTERIOGRAPHY; ADDITIONAL ARTERIOGRAPHY; IR ULTRASOUND GUIDANCE VASC ACCESS RIGHT; PELVIC SELECTIVE ARTERIOGRAPHY MEDICATIONS: None ANESTHESIA/SEDATION: Moderate (conscious) sedation was employed during this procedure. A total of Versed 2 mg and Fentanyl 100 mcg was administered intravenously. Moderate Sedation Time: 115 minutes. The patient's level of consciousness and vital signs were monitored continuously by radiology nursing throughout the procedure under my direct supervision. CONTRAST:  46 mL Visipaque 320 FLUOROSCOPY TIME:  Fluoroscopy Time: 19 minutes 12 seconds (911 mGy). COMPLICATIONS: None immediate. PROCEDURE: Informed consent was obtained from the patient following explanation of the procedure, risks, benefits and alternatives. The patient understands, agrees and consents for the procedure. All questions were addressed. A time out was performed prior to the initiation of the procedure. Maximal barrier sterile technique utilized including caps, mask, sterile gowns, sterile gloves, large sterile drape, hand hygiene, and Betadine prep. The right common femoral artery was interrogated with ultrasound and found to be widely patent. An image was obtained and stored for the medical record. Local anesthesia was attained by infiltration with 1% lidocaine. A small dermatotomy was made. Under real-time sonographic guidance, the vessel was punctured with a 21 gauge micropuncture needle. Using standard technique, the initial micro needle was exchanged over a 0.018 micro wire for a transitional 4 [place] micro sheath. The micro sheath was then exchanged over a 0.035 wire for a 5 French vascular sheath. A C2 cobra catheter was advanced of the abdominal aorta over a Bentson wire in used to select the celiac artery. A celiac arteriogram was performed. Conventional anatomy. No evidence of replaced middle colloid artery. The C2 catheter was next advanced into the superior mesenteric artery.  A superior mesenteric  arteriogram was performed using carbon dioxide gas. This resulted in suboptimal visualization. Therefore, arteriography was performed using Visipaque. The middle colloid artery arises proximally from the SMA. There is a visible marginal artery of [place] extending into the left colloid artery and then the IMA. No definite filling of the aneurysm sac. An echelon 14 microcatheter was then successfully navigated over a Fathom 14 wire into the middle colloid artery. Arteriography confirms the catheter position. The catheter was next navigated into the marginal artery of [place]. Arteriography again performed confirming catheter position and anatomy. The artery was then advanced into the left colloid artery. Arteriography again confirms catheter position and anatomy. Still no filling of the aneurysm sac. It is difficult to tell if the IMA is occluded or if there is outflow from the aneurysm sac. Finally, after significant effort, the microcatheter was successfully advanced into the SMA. Arteriography was performed. The origin of the SMA is occluded. There is a thin channel leading to the aneurysm sac in an filling the Vasa basal rim. There is no endoleak arising from the IMA. The catheter was removed. The C2 cobra catheter was readvanced through the sheath and into the internal iliac artery. An internal iliac arteriogram was performed. The ascending ileo lumbar artery is successfully identified. No evidence of right-sided lumbar artery endoleak. The C2 catheter was removed. A limited right common femoral arteriogram was performed confirming common femoral arterial access. Hemostasis was attained with the assistance of a 6 French Angio-Seal device. IMPRESSION: 1. 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 No evidence of type 2 endoleak arising from the IMA, or the right-sided lumbar arteries. Signed, Criselda Peaches, MD [facility] Radiology Electronically Signed   By: Jacqulynn Cadet M.D.   On: 11/24/2017 14:29   Ir Angiogram Selective Each Additional Vessel  Result Date: 11/24/2017 INDICATION: 83 year old male with an enlarging infrarenal abdominal aortic aneurysm despite prior endovascular aortic repair. 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 Patient has significant chronic kidney disease and was admitted yesterday for pre-procedure hydration. He presents now for multi selective angiography and attempted endoleak repair. EXAM: SELECTIVE VISCERAL ARTERIOGRAPHY; ADDITIONAL ARTERIOGRAPHY; IR ULTRASOUND GUIDANCE VASC ACCESS RIGHT; PELVIC SELECTIVE ARTERIOGRAPHY MEDICATIONS: None ANESTHESIA/SEDATION: Moderate (conscious) sedation was employed during this procedure. A total of Versed 2 mg and Fentanyl 100 mcg was administered intravenously. Moderate Sedation Time: 115 minutes. The patient's level of consciousness and vital signs were monitored continuously by radiology nursing throughout the procedure under my direct supervision. CONTRAST:  46 mL Visipaque 320 FLUOROSCOPY TIME:  Fluoroscopy Time: 19 minutes 12 seconds (911 mGy). COMPLICATIONS: None immediate. PROCEDURE: Informed consent was obtained from the patient following explanation of the procedure, risks, benefits and alternatives. The patient understands, agrees and consents for the procedure. All questions were addressed. A time out was performed prior to the initiation of the procedure. Maximal barrier sterile technique utilized including caps, mask, sterile gowns, sterile gloves, large sterile drape, hand hygiene, and Betadine prep. The right common femoral artery was interrogated with ultrasound and found to be widely patent. An image was obtained and stored for the medical record. Local anesthesia was attained by infiltration with 1% lidocaine.  A small dermatotomy was made. Under real-time sonographic guidance, the vessel was punctured with a 21 gauge micropuncture needle. Using standard technique, the initial micro needle was exchanged over a 0.018  micro wire for a transitional 4 [place] micro sheath. The micro sheath was then exchanged over a 0.035 wire for a 5 French vascular sheath. A C2 cobra catheter was advanced of the abdominal aorta over a Bentson wire in used to select the celiac artery. A celiac arteriogram was performed. Conventional anatomy. No evidence of replaced middle colloid artery. The C2 catheter was next advanced into the superior mesenteric artery. A superior mesenteric arteriogram was performed using carbon dioxide gas. This resulted in suboptimal visualization. Therefore, arteriography was performed using Visipaque. The middle colloid artery arises proximally from the SMA. There is a visible marginal artery of [place] extending into the left colloid artery and then the IMA. No definite filling of the aneurysm sac. An echelon 14 microcatheter was then successfully navigated over a Fathom 14 wire into the middle colloid artery. Arteriography confirms the catheter position. The catheter was next navigated into the marginal artery of [place]. Arteriography again performed confirming catheter position and anatomy. The artery was then advanced into the left colloid artery. Arteriography again confirms catheter position and anatomy. Still no filling of the aneurysm sac. It is difficult to tell if the IMA is occluded or if there is outflow from the aneurysm sac. Finally, after significant effort, the microcatheter was successfully advanced into the SMA. Arteriography was performed. The origin of the SMA is occluded. There is a thin channel leading to the aneurysm sac in an filling the Vasa basal rim. There is no endoleak arising from the IMA. The catheter was removed. The C2 cobra catheter was readvanced through the sheath and into the  internal iliac artery. An internal iliac arteriogram was performed. The ascending ileo lumbar artery is successfully identified. No evidence of right-sided lumbar artery endoleak. The C2 catheter was removed. A limited right common femoral arteriogram was performed confirming common femoral arterial access. Hemostasis was attained with the assistance of a 6 French Angio-Seal device. IMPRESSION: 1. 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 No evidence of type 2 endoleak arising from the IMA, or the right-sided lumbar arteries. Signed, Criselda Peaches, MD [facility] Radiology Electronically Signed   By: Jacqulynn Cadet M.D.   On: 11/24/2017 14:29   Ir Angiogram Selective Each Additional Vessel  Result Date: 11/24/2017 INDICATION: 83 year old male with an enlarging infrarenal abdominal aortic aneurysm despite prior endovascular aortic repair. 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 Patient has significant chronic kidney disease and was admitted yesterday for pre-procedure hydration. He presents now for multi selective angiography and attempted endoleak repair. EXAM: SELECTIVE VISCERAL ARTERIOGRAPHY; ADDITIONAL ARTERIOGRAPHY; IR ULTRASOUND GUIDANCE VASC ACCESS RIGHT; PELVIC SELECTIVE ARTERIOGRAPHY MEDICATIONS: None ANESTHESIA/SEDATION: Moderate (conscious) sedation was employed during this procedure. A total of Versed 2 mg and Fentanyl 100 mcg was administered intravenously. Moderate Sedation Time: 115 minutes. The patient's level of consciousness and vital signs were monitored continuously by radiology nursing throughout the procedure under my direct supervision. CONTRAST:  46 mL Visipaque 320 FLUOROSCOPY TIME:  Fluoroscopy Time: 19 minutes 12 seconds (911 mGy). COMPLICATIONS: None immediate.  PROCEDURE: Informed consent was obtained from the patient following explanation of the procedure, risks, benefits and alternatives. The patient understands, agrees and consents for the procedure. All questions were addressed. A time out was performed prior to the initiation of the procedure. Maximal barrier sterile technique utilized including caps, mask, sterile gowns, sterile gloves, large sterile drape,  hand hygiene, and Betadine prep. The right common femoral artery was interrogated with ultrasound and found to be widely patent. An image was obtained and stored for the medical record. Local anesthesia was attained by infiltration with 1% lidocaine. A small dermatotomy was made. Under real-time sonographic guidance, the vessel was punctured with a 21 gauge micropuncture needle. Using standard technique, the initial micro needle was exchanged over a 0.018 micro wire for a transitional 4 [place] micro sheath. The micro sheath was then exchanged over a 0.035 wire for a 5 French vascular sheath. A C2 cobra catheter was advanced of the abdominal aorta over a Bentson wire in used to select the celiac artery. A celiac arteriogram was performed. Conventional anatomy. No evidence of replaced middle colloid artery. The C2 catheter was next advanced into the superior mesenteric artery. A superior mesenteric arteriogram was performed using carbon dioxide gas. This resulted in suboptimal visualization. Therefore, arteriography was performed using Visipaque. The middle colloid artery arises proximally from the SMA. There is a visible marginal artery of [place] extending into the left colloid artery and then the IMA. No definite filling of the aneurysm sac. An echelon 14 microcatheter was then successfully navigated over a Fathom 14 wire into the middle colloid artery. Arteriography confirms the catheter position. The catheter was next navigated into the marginal artery of [place]. Arteriography again performed confirming  catheter position and anatomy. The artery was then advanced into the left colloid artery. Arteriography again confirms catheter position and anatomy. Still no filling of the aneurysm sac. It is difficult to tell if the IMA is occluded or if there is outflow from the aneurysm sac. Finally, after significant effort, the microcatheter was successfully advanced into the SMA. Arteriography was performed. The origin of the SMA is occluded. There is a thin channel leading to the aneurysm sac in an filling the Vasa basal rim. There is no endoleak arising from the IMA. The catheter was removed. The C2 cobra catheter was readvanced through the sheath and into the internal iliac artery. An internal iliac arteriogram was performed. The ascending ileo lumbar artery is successfully identified. No evidence of right-sided lumbar artery endoleak. The C2 catheter was removed. A limited right common femoral arteriogram was performed confirming common femoral arterial access. Hemostasis was attained with the assistance of a 6 French Angio-Seal device. IMPRESSION: 1. 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 No evidence of type 2 endoleak arising from the IMA, or the right-sided lumbar arteries. Signed, Criselda Peaches, MD [facility] Radiology Electronically Signed   By: Jacqulynn Cadet M.D.   On: 11/24/2017 14:29   Ir Angiogram Selective Each Additional Vessel  Result Date: 11/24/2017 INDICATION: 83 year old male with an enlarging infrarenal abdominal aortic aneurysm despite prior endovascular aortic repair. 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 Patient has significant chronic kidney disease and was admitted yesterday for pre-procedure hydration. He presents now for multi selective angiography and attempted  endoleak repair. EXAM: SELECTIVE VISCERAL ARTERIOGRAPHY; ADDITIONAL ARTERIOGRAPHY; IR ULTRASOUND GUIDANCE VASC ACCESS RIGHT; PELVIC SELECTIVE ARTERIOGRAPHY MEDICATIONS: None ANESTHESIA/SEDATION: Moderate (conscious) sedation was employed during this procedure. A total of Versed 2 mg and Fentanyl 100 mcg was administered intravenously. Moderate Sedation Time: 115 minutes. The patient's level of consciousness and vital signs were monitored continuously by radiology nursing throughout the procedure under my direct supervision. CONTRAST:  46 mL Visipaque 320 FLUOROSCOPY  TIME:  Fluoroscopy Time: 19 minutes 12 seconds (911 mGy). COMPLICATIONS: None immediate. PROCEDURE: Informed consent was obtained from the patient following explanation of the procedure, risks, benefits and alternatives. The patient understands, agrees and consents for the procedure. All questions were addressed. A time out was performed prior to the initiation of the procedure. Maximal barrier sterile technique utilized including caps, mask, sterile gowns, sterile gloves, large sterile drape, hand hygiene, and Betadine prep. The right common femoral artery was interrogated with ultrasound and found to be widely patent. An image was obtained and stored for the medical record. Local anesthesia was attained by infiltration with 1% lidocaine. A small dermatotomy was made. Under real-time sonographic guidance, the vessel was punctured with a 21 gauge micropuncture needle. Using standard technique, the initial micro needle was exchanged over a 0.018 micro wire for a transitional 4 [place] micro sheath. The micro sheath was then exchanged over a 0.035 wire for a 5 French vascular sheath. A C2 cobra catheter was advanced of the abdominal aorta over a Bentson wire in used to select the celiac artery. A celiac arteriogram was performed. Conventional anatomy. No evidence of replaced middle colloid artery. The C2 catheter was next advanced into the superior  mesenteric artery. A superior mesenteric arteriogram was performed using carbon dioxide gas. This resulted in suboptimal visualization. Therefore, arteriography was performed using Visipaque. The middle colloid artery arises proximally from the SMA. There is a visible marginal artery of [place] extending into the left colloid artery and then the IMA. No definite filling of the aneurysm sac. An echelon 14 microcatheter was then successfully navigated over a Fathom 14 wire into the middle colloid artery. Arteriography confirms the catheter position. The catheter was next navigated into the marginal artery of [place]. Arteriography again performed confirming catheter position and anatomy. The artery was then advanced into the left colloid artery. Arteriography again confirms catheter position and anatomy. Still no filling of the aneurysm sac. It is difficult to tell if the IMA is occluded or if there is outflow from the aneurysm sac. Finally, after significant effort, the microcatheter was successfully advanced into the SMA. Arteriography was performed. The origin of the SMA is occluded. There is a thin channel leading to the aneurysm sac in an filling the Vasa basal rim. There is no endoleak arising from the IMA. The catheter was removed. The C2 cobra catheter was readvanced through the sheath and into the internal iliac artery. An internal iliac arteriogram was performed. The ascending ileo lumbar artery is successfully identified. No evidence of right-sided lumbar artery endoleak. The C2 catheter was removed. A limited right common femoral arteriogram was performed confirming common femoral arterial access. Hemostasis was attained with the assistance of a 6 French Angio-Seal device. IMPRESSION: 1. 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 No evidence of type 2 endoleak arising from the IMA, or the right-sided lumbar arteries. Signed, Criselda Peaches, MD [facility] Radiology Electronically Signed   By: Jacqulynn Cadet M.D.   On: 11/24/2017 14:29   Ir Angiogram Selective Each Additional Vessel  Result Date: 11/24/2017 INDICATION: 83 year old male with an enlarging infrarenal abdominal aortic aneurysm despite prior endovascular aortic repair. 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 Patient has significant chronic kidney disease and was admitted yesterday for pre-procedure hydration. He presents now for multi selective angiography and attempted endoleak repair. EXAM: SELECTIVE  VISCERAL ARTERIOGRAPHY; ADDITIONAL ARTERIOGRAPHY; IR ULTRASOUND GUIDANCE VASC ACCESS RIGHT; PELVIC SELECTIVE ARTERIOGRAPHY MEDICATIONS: None ANESTHESIA/SEDATION: Moderate (conscious) sedation was employed during this procedure. A total of Versed 2 mg and Fentanyl 100 mcg was administered intravenously. Moderate Sedation Time: 115 minutes. The patient's level of consciousness and vital signs were monitored continuously by radiology nursing throughout the procedure under my direct supervision. CONTRAST:  46 mL Visipaque 320 FLUOROSCOPY TIME:  Fluoroscopy Time: 19 minutes 12 seconds (911 mGy). COMPLICATIONS: None immediate. PROCEDURE: Informed consent was obtained from the patient following explanation of the procedure, risks, benefits and alternatives. The patient understands, agrees and consents for the procedure. All questions were addressed. A time out was performed prior to the initiation of the procedure. Maximal barrier sterile technique utilized including caps, mask, sterile gowns, sterile gloves, large sterile drape, hand hygiene, and Betadine prep. The right common femoral artery was interrogated with ultrasound and found to be widely patent. An image was obtained and stored for the medical record. Local anesthesia was  attained by infiltration with 1% lidocaine. A small dermatotomy was made. Under real-time sonographic guidance, the vessel was punctured with a 21 gauge micropuncture needle. Using standard technique, the initial micro needle was exchanged over a 0.018 micro wire for a transitional 4 [place] micro sheath. The micro sheath was then exchanged over a 0.035 wire for a 5 French vascular sheath. A C2 cobra catheter was advanced of the abdominal aorta over a Bentson wire in used to select the celiac artery. A celiac arteriogram was performed. Conventional anatomy. No evidence of replaced middle colloid artery. The C2 catheter was next advanced into the superior mesenteric artery. A superior mesenteric arteriogram was performed using carbon dioxide gas. This resulted in suboptimal visualization. Therefore, arteriography was performed using Visipaque. The middle colloid artery arises proximally from the SMA. There is a visible marginal artery of [place] extending into the left colloid artery and then the IMA. No definite filling of the aneurysm sac. An echelon 14 microcatheter was then successfully navigated over a Fathom 14 wire into the middle colloid artery. Arteriography confirms the catheter position. The catheter was next navigated into the marginal artery of [place]. Arteriography again performed confirming catheter position and anatomy. The artery was then advanced into the left colloid artery. Arteriography again confirms catheter position and anatomy. Still no filling of the aneurysm sac. It is difficult to tell if the IMA is occluded or if there is outflow from the aneurysm sac. Finally, after significant effort, the microcatheter was successfully advanced into the SMA. Arteriography was performed. The origin of the SMA is occluded. There is a thin channel leading to the aneurysm sac in an filling the Vasa basal rim. There is no endoleak arising from the IMA. The catheter was removed. The C2 cobra catheter was  readvanced through the sheath and into the internal iliac artery. An internal iliac arteriogram was performed. The ascending ileo lumbar artery is successfully identified. No evidence of right-sided lumbar artery endoleak. The C2 catheter was removed. A limited right common femoral arteriogram was performed confirming common femoral arterial access. Hemostasis was attained with the assistance of a 6 French Angio-Seal device. IMPRESSION: 1. 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 No evidence of type 2 endoleak arising from the IMA, or the right-sided lumbar arteries. Signed, Criselda Peaches, MD [facility] Radiology Electronically Signed   By: Jacqulynn Cadet M.D.   On: 11/24/2017 14:29   Ir US  Guide Vasc Access Right  Result Date: 11/24/2017 INDICATION: 83 year old male with an enlarging infrarenal abdominal aortic aneurysm despite prior endovascular aortic repair. 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 Patient has significant chronic kidney disease and was admitted yesterday for pre-procedure hydration. He presents now for multi selective angiography and attempted endoleak repair. EXAM: SELECTIVE VISCERAL ARTERIOGRAPHY; ADDITIONAL ARTERIOGRAPHY; IR ULTRASOUND GUIDANCE VASC ACCESS RIGHT; PELVIC SELECTIVE ARTERIOGRAPHY MEDICATIONS: None ANESTHESIA/SEDATION: Moderate (conscious) sedation was employed during this procedure. A total of Versed 2 mg and Fentanyl 100 mcg was administered intravenously. Moderate Sedation Time: 115 minutes. The patient's level of consciousness and vital signs were monitored continuously by radiology nursing throughout the procedure under my direct supervision. CONTRAST:  46 mL Visipaque 320 FLUOROSCOPY TIME:  Fluoroscopy Time: 19 minutes 12 seconds (911 mGy).  COMPLICATIONS: None immediate. PROCEDURE: Informed consent was obtained from the patient following explanation of the procedure, risks, benefits and alternatives. The patient understands, agrees and consents for the procedure. All questions were addressed. A time out was performed prior to the initiation of the procedure. Maximal barrier sterile technique utilized including caps, mask, sterile gowns, sterile gloves, large sterile drape, hand hygiene, and Betadine prep. The right common femoral artery was interrogated with ultrasound and found to be widely patent. An image was obtained and stored for the medical record. Local anesthesia was attained by infiltration with 1% lidocaine. A small dermatotomy was made. Under real-time sonographic guidance, the vessel was punctured with a 21 gauge micropuncture needle. Using standard technique, the initial micro needle was exchanged over a 0.018 micro wire for a transitional 4 [place] micro sheath. The micro sheath was then exchanged over a 0.035 wire for a 5 French vascular sheath. A C2 cobra catheter was advanced of the abdominal aorta over a Bentson wire in used to select the celiac artery. A celiac arteriogram was performed. Conventional anatomy. No evidence of replaced middle colloid artery. The C2 catheter was next advanced into the superior mesenteric artery. A superior mesenteric arteriogram was performed using carbon dioxide gas. This resulted in suboptimal visualization. Therefore, arteriography was performed using Visipaque. The middle colloid artery arises proximally from the SMA. There is a visible marginal artery of [place] extending into the left colloid artery and then the IMA. No definite filling of the aneurysm sac. An echelon 14 microcatheter was then successfully navigated over a Fathom 14 wire into the middle colloid artery. Arteriography confirms the catheter position. The catheter was next navigated into the marginal artery of [place]. Arteriography  again performed confirming catheter position and anatomy. The artery was then advanced into the left colloid artery. Arteriography again confirms catheter position and anatomy. Still no filling of the aneurysm sac. It is difficult to tell if the IMA is occluded or if there is outflow from the aneurysm sac. Finally, after significant effort, the microcatheter was successfully advanced into the SMA. Arteriography was performed. The origin of the SMA is occluded. There is a thin channel leading to the aneurysm sac in an filling the Vasa basal rim. There is no endoleak arising from the IMA. The catheter was removed. The C2 cobra catheter was readvanced through the sheath and into the internal iliac artery. An internal iliac arteriogram was performed. The ascending ileo lumbar artery is successfully identified. No evidence of right-sided lumbar artery endoleak. The C2 catheter was removed. A limited right common femoral arteriogram was performed confirming common femoral arterial access. Hemostasis was  attained with the assistance of a 6 French Angio-Seal device. IMPRESSION: 1. 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 No evidence of type 2 endoleak arising from the IMA, or the right-sided lumbar arteries. Signed, Criselda Peaches, MD [facility] Radiology Electronically Signed   By: Jacqulynn Cadet M.D.   On: 11/24/2017 14:29   Dg Chest Port 1 View  Result Date: 11/26/2017 CLINICAL DATA:  Atelectasis EXAM: PORTABLE CHEST 1 VIEW COMPARISON:  11/25/2017 FINDINGS: Cardiomegaly with vascular congestion. Bilateral perihilar and lower lobe opacities likely reflect edema with small effusions. Improved aeration on the left since prior study. IMPRESSION: Cardiomegaly with bilateral perihilar and lower lobe opacities, likely edema/ CHF. Suspect small effusions. Improved aeration on the left. Electronically  Signed   By: Rolm Baptise M.D.   On: 11/26/2017 07:38   Dg Chest Port 1 View  Result Date: 11/24/2017 CLINICAL DATA:  83 year old male with history of shortness of breath. EXAM: PORTABLE CHEST 1 VIEW COMPARISON:  Chest x-ray 09/03/2017. FINDINGS: New area of airspace consolidation in the right mid to lower lung, not obscuring the right hemidiaphragm, potentially within either the right middle or lower lobe. No pleural effusions. Cephalization of pulmonary vasculature, without frank pulmonary edema. Mild diffuse peribronchial cuffing. Heart size is upper limits of normal. Aortic atherosclerosis. IMPRESSION: 1. Diffuse bronchial wall thickening concerning for an acute bronchitis. In addition, there is a new area of apparent airspace consolidation which could be either within the right middle lobe or right lower lobe, concerning for developing pneumonia. Followup PA and lateral chest X-ray is recommended in 3-4 weeks following trial of antibiotic therapy to ensure resolution and exclude underlying malignancy. 2. Aortic atherosclerosis. Electronically Signed   By: Vinnie Langton M.D.   On: 11/24/2017 11:12     Medications:  Scheduled: . Carbidopa-Levodopa ER  0.5 tablet Oral BID  . docusate sodium  100 mg Oral Daily  . donepezil  5 mg Oral QHS  . hydrALAZINE  25 mg Oral TID  . labetalol  100 mg Oral BID  . prednisoLONE acetate  1 drop Both Eyes BID  . pregabalin  50 mg Oral QHS   Continuous:  [id number], fentaNYL (SUBLIMAZE) injection, hydrALAZINE, iodixanol, ondansetron (ZOFRAN) IV, oxyCODONE **OR** oxyCODONE  Assessment/Plan:  Active Problems:   Pressure injury of skin   Leaking abdominal aortic aneurysm (AAA) ([place])    Acute respiratory failure with hypoxia Patient developed hypoxia around the time of his procedure on 11/29.  His saturations were in the mid 80s.  There was suspicion for fluid overload as the patient did have edema and he got fluids throughout the night.  Fluids  were stopped and he was given high-dose Lasix.  He diuresed well.  Developed coughing episodes and x-ray was repeated.  This revealed significant new changes including right to left mediastinal shift with volume loss in the left lung possible atelectasis versus pleural effusion.  Considering the sudden change in 24 hrs this is most likely atelectasis/mucus plugging.  Pulmonology was consulted.  He was given Mucomyst and vibra vest and chest PT. repeat x-ray today shows improvement in the aeration of the left lung however does suggest interstitial edema.  He did not make much urine in the last 24 hours.  He will be given a dose of IV Lasix.   History of AAA with concern for endoleak Seen by interventional radiology and underwent arteriography.  Did not show any endoleak which is reassuring.  Discussed with Dr. Laurence Ferrari who said that he had to give very small amount of contrast so hopefully this will not worsen his renal function.  History of essential hypertension Monitor blood pressures closely.  Continue home medication.  Chronic kidney disease stage IV Baseline creatinine around 2.4-2.8.  Patient given high-dose Lasix 11/29.  Lasix was held on 11/30.  Patient has not made much urine in the last 24 hours.  Chest x-ray continues to show pulmonary edema.  He will be given additional dose of Lasix today.  Family extremely concerned about the rising creatinine.  Will consult nephrology to assist with management.  Weight has reduced. Followed by nephrologist in [facility], Dr. Dimas Aguas.  Echocardiogram from 2016 showed grade 2 diastolic dysfunction.  History of renal cell carcinoma with solitary kidney As above. Monitor urine output.  Anemia likely due to chronic kidney disease Hemoglobin stable.  No evidence of overt bleeding  Thrombocytopenia Previous labs reviewed and he has chronic thrombocytopenia.  Etiology is unclear.  Counts are stable.  Continue to monitor.  History of Parkinson's  disease Continue home medications.  DVT Prophylaxis: SCDs    Code Status: Full code Family Communication: Discussed with patient's son-in-law Disposition Plan: Management as outlined above.  Nephrology consulted.    LOS: 3 days   [facility] Pager [phone] 11/26/2017, 10:28 AM  If 7PM-7AM, please contact night-coverage at [email or web address], password [facility]

## 2017-11-26 NOTE — Consult Note (Signed)
Renal Service Consult Note [name]  [name] 11/26/2017 [place] Requesting Physician:  Dr [name]   Reason for Consult:  AKI on CKD HPI: The patient is a 83 y.o. year-old with hx of anemia, DJD, ulcer, COPD, dementia, HTN, RCC sp L nephrectomy, spinal stenosis and CKD stage IV.  He has been f/b nephrology in [place] with [place] and in [facility].  Pt was admitted due to concern for cause of anemia and question of a possible endoleak after AAA endovasc repair done in 2009.  He was admitted with creat of 2.3, had the procedure including IV contrast on 11/29 and now the creat is rising, up to 2.6 yesterday and 2.8 today.  He also developed CHF/ SOB from IVF"s and has been getting IV lasix and breathing is "better" per the son-in-law that is here. Asked to see for A/C renal failrue.    Pt is demented, but pleasant. His son provides most of the history.  Pt has no c/o. Denies any CP, SOB, cough, abd pain, voiding issues, change in urine color.   Date     Creat  eGFR 2009   1.10  >60 2016   2.1- 2.4  2017   2.1- 2.6 Apr '18   2.3    Sept '18  2.3 Nov 23, 2017    2.33  25 Nov 26, 2017  2.81  20   ROS  denies CP  no joint pain   no HA  no blurry vision  no rash  no diarrhea  no nausea/ vomiting  no dysuria  no difficulty voiding  no change in urine color    Past Medical History  Past Medical History:  Diagnosis Date  . Abdominal aortic aneurysm ([place]) 09/2008   s/o endovascular repair and angiogram 10/04/08   . Anemia    EGD Cscope 08-2012  . Arthritis    "hips" (11/23/2017)  . Bleeding stomach ulcer 1990s  . Bleeding ulcer 1997  . CKD (chronic kidney disease), stage IV ([facility])    Stable, Dr. Dimas Aguas  . COPD (chronic obstructive pulmonary disease) ([place])    "mild" (11/23/2017)  . Dementia    "on very small dose of Aricept" (11/23/2017)  . H/O hyperkalemia    Stable  . Hypertension   . Leukopenia 2/11   thought to be from doxy  . Pneumonia 2018  X 2  . Renal cell carcinoma    clear cell type dx 10/09, s/p excision 10-24-08  . Renal insufficiency   . Solitary left kidney    W/ chronic tubule-interstitial damage, Dr. Dimas Aguas  . Spinal stenosis    causing LE weakness-persistant, being worked up by Hydrograph[employer] surveyor and neurologist   Past Surgical History  Past Surgical History:  Procedure Laterality Date  . ABDOMINAL AORTIC ANEURYSM REPAIR  2009   EVAR  . CATARACT EXTRACTION W/ INTRAOCULAR LENS  IMPLANT, BILATERAL Bilateral   . HEMORRHOID SURGERY  1990  . IR ANGIOGRAM PELVIS SELECTIVE OR SUPRASELECTIVE  11/24/2017  . IR ANGIOGRAM SELECTIVE EACH ADDITIONAL VESSEL  11/24/2017  . IR ANGIOGRAM SELECTIVE EACH ADDITIONAL VESSEL  11/24/2017  . IR ANGIOGRAM SELECTIVE EACH ADDITIONAL VESSEL  11/24/2017  . IR ANGIOGRAM SELECTIVE EACH ADDITIONAL VESSEL  11/24/2017  . IR ANGIOGRAM VISCERAL SELECTIVE  11/24/2017  . IR ANGIOGRAM VISCERAL SELECTIVE  11/24/2017  . IR RADIOLOGIST EVAL & MGMT  09/07/2017  . IR US GUIDE VASC ACCESS RIGHT  11/24/2017  . [place] (  lip), 2004  . NEPHRECTOMY  2009   Open nephrectomy for cancer   Family History  Family History  Problem Relation Age of Onset  . Diabetes Father        ?  . Stroke Father   . Hypertension Father   . Colon cancer Neg Hx   . Prostate cancer Neg Hx    Social History  reports that he quit smoking about 9 years ago. He has a 90.00 pack-year smoking history. he has never used smokeless tobacco. He reports that he does not drink alcohol or use drugs. Allergies  Allergies  Allergen Reactions  . Iodinated Diagnostic Agents Other (See Comments)    Only one kidney   . Ioxaglate Other (See Comments)    Only one kidney   . Baclofen Other (See Comments)    Unable to walk per Pt's family    Home medications Prior to Admission medications   Medication Sig Start Date End Date Taking? Authorizing Provider  Carbidopa-Levodopa ER (SINEMET CR) 25-100 MG tablet controlled  release Take 0.5 tablets by mouth 2 (two) times daily.   Yes [provider]  Cholecalciferol (VITAMIN D) 2000 units CAPS Take 2,000 Units by mouth daily.   Yes [provider]  docusate sodium (COLACE) 100 MG capsule Take 100 mg by mouth daily.   Yes [provider]  donepezil (ARICEPT) 5 MG tablet Take 5 mg by mouth at bedtime.   Yes [provider]  FeFum-FePo-FA-B Cmp-C-Zn-Mn-Cu (SE-TAN PLUS) 162-115.2-1 MG CAPS Take 1 capsule by mouth daily. Take 1 Tab  daily   Yes [provider]  hydrALAZINE (APRESOLINE) 25 MG tablet Take 25 mg by mouth 3 (three) times daily.   Yes [provider]  hydrochlorothiazide (HYDRODIURIL) 12.5 MG tablet Take 12.5 mg daily by mouth.   Yes [provider]  labetalol (NORMODYNE) 100 MG tablet Take 1 tablet (100 mg total) by mouth 2 (two) times daily. 07/14/15  Yes Paz, Alda Berthold, MD  prednisoLONE acetate (PRED FORTE) 1 % ophthalmic suspension Place 1 drop into both eyes 2 (two) times daily. 11/16/17  Yes [provider]  pregabalin (LYRICA) 50 MG capsule Take 50 mg by mouth at bedtime.    Yes [provider]  sodium polystyrene (KAYEXALATE) 15 GM/60ML suspension Take 15 g by mouth 2 (two) times a week. Take 15 Grams on Monday and Friday   Yes [provider]   Liver Function Tests Recent Labs  Lab 11/23/17 1953  AST 19  ALT 7*  ALKPHOS 46  BILITOT 0.6  PROT 5.7*  ALBUMIN 3.5   No results for input(s): LIPASE, AMYLASE in the last 168 hours. CBC Recent Labs  Lab 11/24/17 0623 11/25/17 0501 11/26/17 0523  WBC 2.4* 4.7 3.6*  HGB 8.7* 8.8* 8.7*  HCT 27.5* 28.0* 27.5*  MCV 97.9 96.9 97.2  PLT 80* 82* 74*   Basic Metabolic Panel Recent Labs  Lab 11/23/17 1953 11/24/17 0623 11/25/17 0501 11/26/17 0523  NA 139 142 140 137  K 3.6 3.8 3.8 3.7  CL 106 109 108 103  CO2 '26 27 25 24  '$ GLUCOSE 123* 95 103* 86  BUN 32* 32* 37* 42*  CREATININE 2.33* 2.44* 2.67* 2.81*   CALCIUM 8.4* 8.2* 8.3* 8.2*   Iron/TIBC/Ferritin/ %Sat    Component Value Date/Time   IRON 66 06/09/2009 0909   FERRITIN 90.2 06/09/2009 0909    Vitals:   11/26/17 0835 11/26/17 1000 11/26/17 1047 11/26/17 1300  BP:  124/60  Pulse:  76    Resp:  18    Temp:  98.2 F (36.8 C)    TempSrc:  Oral    SpO2: 97% 98%  93%  Weight:   70.8 kg (156 lb)   Height:       Exam Gen elderly HOH male, no distress, up in chair No rash, cyanosis or gangrene Sclera anicteric, throat clear  No jvd or bruits Chest scattered crackles R lower lung field, L mostly clear, mild diffuse wheezing RRR no MRG  Abd soft ntnd no mass or ascites +bs  GU normal male w condom cath in place defer MS no joint effusions or deformity Ext 1+ pitting pretib / pedal edema bilat, no wounds or ulcers Neuro is sleepy but easily awakens, NF, ox 2  No UA CXR 12/01 > Cardiomegaly with bilateral perihilar and lower lobe opacities, likely edema/ CHF. Suspect small effusions.   Inpt meds: mucomyst, sinemet CR, colace, aricept, fentanyl, lasix IV, hydralazine 25 mg po tid, HTCZ 12.5 mg /d, labealol 100 bid, versed, lyrica, albuterol, versed,    Impression: 1. Acute kidney injury - due to contrast nephropathy. Making urine.   2. CKD baseline creat 2.1- 2.5, stage IV ckd.  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  3. Pulm edema/ dyspnea - improving, on IV lasix. CXR better.  4. Anemia - mesenteric angiogram did not show any signs of endoleak, this was reason for admission.  5. HTN - bp's good on po hydralazine and low dose labetalol 6. Dementia/ Parkinson's   Plan - agree w/ your management, still a bit wet and would continue lasix IV 1-2 times per day as you're doing.  Await improvement in renal function. Have explained in detail to son-in-law, daughter not here.    Kelly Splinter MD [employer] pager [phone]   11/26/2017, 3:56 PM

## 2017-11-26 NOTE — Progress Notes (Signed)
Name: [name] MRN: [id number] DOB: Jun 01, 1927    ADMISSION DATE:  11/23/2017 CONSULTATION DATE:  11/23/17  REFERRING MD :  [name]  CHIEF COMPLAINT:  SOB   HISTORY OF PRESENT ILLNESS:  [name] is a 83 y.o. male with a PMH as outlined below.  He was admitted on 11/28 as direct admit from nephrology office for possible AAA endoleak repair by IR.  He apparently had a routine check on AAA and was told he had an enlarging aneurysm suspicious for an endoleak (initially had infrarenal aortic aneurysm repair on 10/04/08 by Dr. Trula Slade).  11/29 he was taken to IR for where he was found to have no evidence of endoleak.  Post procedure, he was mildly hypoxic.  He was placed on 1L O2 and had improvement in SpO2 from 80s to 90s.  CXR initially showed some RLL consolidation vs atelectasis.  On 11/30, repeat CXR demonstrated almost complete white out of left lung with mediastinal shift to the left.  Findings most consistent with significant atelectasis.  He remained on 1L O2 and SpO2 remained in high 90s.  PCCM called to see in consultation.   SUBJECTIVE:  Pt is feeling better . Less dyspnea . Using VEST , flutter and IS,  Still has congested cough .    VITAL SIGNS: Temp:  [98 F (36.7 C)-99.2 F (37.3 C)] 98.2 F (36.8 C) (12/01 1000) Pulse Rate:  [60-79] 76 (12/01 1000) Resp:  [16-18] 18 (12/01 1000) BP: (118-135)/(49-62) 124/60 (12/01 1000) SpO2:  [95 %-98 %] 98 % (12/01 1000) Weight:  [156 lb (70.8 kg)] 156 lb (70.8 kg) (12/01 1047)  PHYSICAL EXAMINATION: General: Chronically ill appearing elderly male, NAD Neuro: Alert and interactive, moving all ext to command HEENT: [place]/AT, PERRL, EOM-I and MMM Cardiovascular: RRR, Nl S1/S2, -M/R/G. Lungs: few rhonchi  Abdomen: Soft, NT, ND and +BS Musculoskeletal: -edema and -tenderness Skin: Intact    Recent Labs  Lab 11/24/17 0623 11/25/17 0501 11/26/17 0523  NA 142 140 137  K 3.8 3.8 3.7  CL 109 108 103  CO2 27 25 24    BUN 32* 37* 42*  CREATININE 2.44* 2.67* 2.81*  GLUCOSE 95 103* 86   Recent Labs  Lab 11/24/17 0623 11/25/17 0501 11/26/17 0523  HGB 8.7* 8.8* 8.7*  HCT 27.5* 28.0* 27.5*  WBC 2.4* 4.7 3.6*  PLT 80* 82* 74*   Dg Chest 2 View  Result Date: 11/25/2017 CLINICAL DATA:  Cough, shortness of breath. EXAM: CHEST  2 VIEW COMPARISON:  Radiograph of November 24, 2017. FINDINGS: Stable cardiomegaly. Atherosclerosis of thoracic aorta is noted. Significantly increased opacity of the left lung is noted most consistent with atelectasis and associated pleural effusion. Significantly increased right to left midline shift is noted most consistent with volume loss. Hyperexpansion of the right lung is noted with mild right basilar atelectasis. No pneumothorax is noted. Bony thorax is unremarkable. IMPRESSION: Significant right to left mediastinal shift is noted most likely due to atelectasis and volume loss in the left hemithorax, with possible associated pleural effusion. Mild right basilar subsegmental atelectasis is noted. Electronically Signed   By: Marijo Conception, M.D.   On: 11/25/2017 10:21   Ir Angiogram Visceral Selective  Result Date: 11/24/2017 INDICATION: 83 year old male with an enlarging infrarenal abdominal aortic aneurysm despite prior endovascular aortic repair. 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 Patient has significant chronic kidney disease and was admitted yesterday  for pre-procedure hydration. He presents now for multi selective angiography and attempted endoleak repair. EXAM: SELECTIVE VISCERAL ARTERIOGRAPHY; ADDITIONAL ARTERIOGRAPHY; IR ULTRASOUND GUIDANCE VASC ACCESS RIGHT; PELVIC SELECTIVE ARTERIOGRAPHY MEDICATIONS: None ANESTHESIA/SEDATION: Moderate (conscious) sedation was employed during this procedure. A total of Versed 2 mg and Fentanyl 100 mcg was administered intravenously.  Moderate Sedation Time: 115 minutes. The patient's level of consciousness and vital signs were monitored continuously by radiology nursing throughout the procedure under my direct supervision. CONTRAST:  46 mL Visipaque 320 FLUOROSCOPY TIME:  Fluoroscopy Time: 19 minutes 12 seconds (911 mGy). COMPLICATIONS: None immediate. PROCEDURE: Informed consent was obtained from the patient following explanation of the procedure, risks, benefits and alternatives. The patient understands, agrees and consents for the procedure. All questions were addressed. A time out was performed prior to the initiation of the procedure. Maximal barrier sterile technique utilized including caps, mask, sterile gowns, sterile gloves, large sterile drape, hand hygiene, and Betadine prep. The right common femoral artery was interrogated with ultrasound and found to be widely patent. An image was obtained and stored for the medical record. Local anesthesia was attained by infiltration with 1% lidocaine. A small dermatotomy was made. Under real-time sonographic guidance, the vessel was punctured with a 21 gauge micropuncture needle. Using standard technique, the initial micro needle was exchanged over a 0.018 micro wire for a transitional 4 [place] micro sheath. The micro sheath was then exchanged over a 0.035 wire for a 5 French vascular sheath. A C2 cobra catheter was advanced of the abdominal aorta over a Bentson wire in used to select the celiac artery. A celiac arteriogram was performed. Conventional anatomy. No evidence of replaced middle colloid artery. The C2 catheter was next advanced into the superior mesenteric artery. A superior mesenteric arteriogram was performed using carbon dioxide gas. This resulted in suboptimal visualization. Therefore, arteriography was performed using Visipaque. The middle colloid artery arises proximally from the SMA. There is a visible marginal artery of [place] extending into the left colloid artery and then  the IMA. No definite filling of the aneurysm sac. An echelon 14 microcatheter was then successfully navigated over a Fathom 14 wire into the middle colloid artery. Arteriography confirms the catheter position. The catheter was next navigated into the marginal artery of [place]. Arteriography again performed confirming catheter position and anatomy. The artery was then advanced into the left colloid artery. Arteriography again confirms catheter position and anatomy. Still no filling of the aneurysm sac. It is difficult to tell if the IMA is occluded or if there is outflow from the aneurysm sac. Finally, after significant effort, the microcatheter was successfully advanced into the SMA. Arteriography was performed. The origin of the SMA is occluded. There is a thin channel leading to the aneurysm sac in an filling the Vasa basal rim. There is no endoleak arising from the IMA. The catheter was removed. The C2 cobra catheter was readvanced through the sheath and into the internal iliac artery. An internal iliac arteriogram was performed. The ascending ileo lumbar artery is successfully identified. No evidence of right-sided lumbar artery endoleak. The C2 catheter was removed. A limited right common femoral arteriogram was performed confirming common femoral arterial access. Hemostasis was attained with the assistance of a 6 French Angio-Seal device. IMPRESSION: 1. 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 No evidence of type 2 endoleak arising from the IMA, or the right-sided lumbar arteries. Signed, Criselda Peaches, MD [facility] Radiology Electronically Signed  By: Jacqulynn Cadet M.D.   On: 11/24/2017 14:29   Ir Angiogram Visceral Selective  Result Date: 11/24/2017 INDICATION: 83 year old male with an enlarging infrarenal abdominal aortic aneurysm despite prior endovascular aortic repair.  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 Patient has significant chronic kidney disease and was admitted yesterday for pre-procedure hydration. He presents now for multi selective angiography and attempted endoleak repair. EXAM: SELECTIVE VISCERAL ARTERIOGRAPHY; ADDITIONAL ARTERIOGRAPHY; IR ULTRASOUND GUIDANCE VASC ACCESS RIGHT; PELVIC SELECTIVE ARTERIOGRAPHY MEDICATIONS: None ANESTHESIA/SEDATION: Moderate (conscious) sedation was employed during this procedure. A total of Versed 2 mg and Fentanyl 100 mcg was administered intravenously. Moderate Sedation Time: 115 minutes. The patient's level of consciousness and vital signs were monitored continuously by radiology nursing throughout the procedure under my direct supervision. CONTRAST:  46 mL Visipaque 320 FLUOROSCOPY TIME:  Fluoroscopy Time: 19 minutes 12 seconds (911 mGy). COMPLICATIONS: None immediate. PROCEDURE: Informed consent was obtained from the patient following explanation of the procedure, risks, benefits and alternatives. The patient understands, agrees and consents for the procedure. All questions were addressed. A time out was performed prior to the initiation of the procedure. Maximal barrier sterile technique utilized including caps, mask, sterile gowns, sterile gloves, large sterile drape, hand hygiene, and Betadine prep. The right common femoral artery was interrogated with ultrasound and found to be widely patent. An image was obtained and stored for the medical record. Local anesthesia was attained by infiltration with 1% lidocaine. A small dermatotomy was made. Under real-time sonographic guidance, the vessel was punctured with a 21 gauge micropuncture needle. Using standard technique, the initial micro needle was exchanged over a 0.018 micro wire for a transitional 4 [place] micro sheath. The micro sheath was then exchanged over a 0.035 wire for a  5 French vascular sheath. A C2 cobra catheter was advanced of the abdominal aorta over a Bentson wire in used to select the celiac artery. A celiac arteriogram was performed. Conventional anatomy. No evidence of replaced middle colloid artery. The C2 catheter was next advanced into the superior mesenteric artery. A superior mesenteric arteriogram was performed using carbon dioxide gas. This resulted in suboptimal visualization. Therefore, arteriography was performed using Visipaque. The middle colloid artery arises proximally from the SMA. There is a visible marginal artery of [place] extending into the left colloid artery and then the IMA. No definite filling of the aneurysm sac. An echelon 14 microcatheter was then successfully navigated over a Fathom 14 wire into the middle colloid artery. Arteriography confirms the catheter position. The catheter was next navigated into the marginal artery of [place]. Arteriography again performed confirming catheter position and anatomy. The artery was then advanced into the left colloid artery. Arteriography again confirms catheter position and anatomy. Still no filling of the aneurysm sac. It is difficult to tell if the IMA is occluded or if there is outflow from the aneurysm sac. Finally, after significant effort, the microcatheter was successfully advanced into the SMA. Arteriography was performed. The origin of the SMA is occluded. There is a thin channel leading to the aneurysm sac in an filling the Vasa basal rim. There is no endoleak arising from the IMA. The catheter was removed. The C2 cobra catheter was readvanced through the sheath and into the internal iliac artery. An internal iliac arteriogram was performed. The ascending ileo lumbar artery is successfully identified. No evidence of right-sided lumbar artery endoleak. The C2 catheter was removed. A limited right  common femoral arteriogram was performed confirming common femoral arterial access. Hemostasis was  attained with the assistance of a 6 French Angio-Seal device. IMPRESSION: 1. 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 No evidence of type 2 endoleak arising from the IMA, or the right-sided lumbar arteries. Signed, Criselda Peaches, MD [facility] Radiology Electronically Signed   By: Jacqulynn Cadet M.D.   On: 11/24/2017 14:29   Ir Angiogram Pelvis Selective Or Supraselective  Result Date: 11/24/2017 INDICATION: 83 year old male with an enlarging infrarenal abdominal aortic aneurysm despite prior endovascular aortic repair. 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 Patient has significant chronic kidney disease and was admitted yesterday for pre-procedure hydration. He presents now for multi selective angiography and attempted endoleak repair. EXAM: SELECTIVE VISCERAL ARTERIOGRAPHY; ADDITIONAL ARTERIOGRAPHY; IR ULTRASOUND GUIDANCE VASC ACCESS RIGHT; PELVIC SELECTIVE ARTERIOGRAPHY MEDICATIONS: None ANESTHESIA/SEDATION: Moderate (conscious) sedation was employed during this procedure. A total of Versed 2 mg and Fentanyl 100 mcg was administered intravenously. Moderate Sedation Time: 115 minutes. The patient's level of consciousness and vital signs were monitored continuously by radiology nursing throughout the procedure under my direct supervision. CONTRAST:  46 mL Visipaque 320 FLUOROSCOPY TIME:  Fluoroscopy Time: 19 minutes 12 seconds (911 mGy). COMPLICATIONS: None immediate. PROCEDURE: Informed consent was obtained from the patient following explanation of the procedure, risks, benefits and alternatives. The patient understands, agrees and consents for the procedure. All questions were addressed. A time out was performed prior to the initiation of the procedure. Maximal barrier sterile technique  utilized including caps, mask, sterile gowns, sterile gloves, large sterile drape, hand hygiene, and Betadine prep. The right common femoral artery was interrogated with ultrasound and found to be widely patent. An image was obtained and stored for the medical record. Local anesthesia was attained by infiltration with 1% lidocaine. A small dermatotomy was made. Under real-time sonographic guidance, the vessel was punctured with a 21 gauge micropuncture needle. Using standard technique, the initial micro needle was exchanged over a 0.018 micro wire for a transitional 4 [place] micro sheath. The micro sheath was then exchanged over a 0.035 wire for a 5 French vascular sheath. A C2 cobra catheter was advanced of the abdominal aorta over a Bentson wire in used to select the celiac artery. A celiac arteriogram was performed. Conventional anatomy. No evidence of replaced middle colloid artery. The C2 catheter was next advanced into the superior mesenteric artery. A superior mesenteric arteriogram was performed using carbon dioxide gas. This resulted in suboptimal visualization. Therefore, arteriography was performed using Visipaque. The middle colloid artery arises proximally from the SMA. There is a visible marginal artery of [place] extending into the left colloid artery and then the IMA. No definite filling of the aneurysm sac. An echelon 14 microcatheter was then successfully navigated over a Fathom 14 wire into the middle colloid artery. Arteriography confirms the catheter position. The catheter was next navigated into the marginal artery of [place]. Arteriography again performed confirming catheter position and anatomy. The artery was then advanced into the left colloid artery. Arteriography again confirms catheter position and anatomy. Still no filling of the aneurysm sac. It is difficult to tell if the IMA is occluded or if there is outflow from the aneurysm sac. Finally, after significant effort, the  microcatheter was successfully advanced into the SMA. Arteriography was performed. The origin of the SMA is occluded. There is  a thin channel leading to the aneurysm sac in an filling the Vasa basal rim. There is no endoleak arising from the IMA. The catheter was removed. The C2 cobra catheter was readvanced through the sheath and into the internal iliac artery. An internal iliac arteriogram was performed. The ascending ileo lumbar artery is successfully identified. No evidence of right-sided lumbar artery endoleak. The C2 catheter was removed. A limited right common femoral arteriogram was performed confirming common femoral arterial access. Hemostasis was attained with the assistance of a 6 French Angio-Seal device. IMPRESSION: 1. 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 No evidence of type 2 endoleak arising from the IMA, or the right-sided lumbar arteries. Signed, Criselda Peaches, MD [facility] Radiology Electronically Signed   By: Jacqulynn Cadet M.D.   On: 11/24/2017 14:29   Ir Angiogram Selective Each Additional Vessel  Result Date: 11/24/2017 INDICATION: 83 year old male with an enlarging infrarenal abdominal aortic aneurysm despite prior endovascular aortic repair. 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 Patient has significant chronic kidney disease and was admitted yesterday for pre-procedure hydration. He presents now for multi selective angiography and attempted endoleak repair. EXAM: SELECTIVE VISCERAL ARTERIOGRAPHY; ADDITIONAL ARTERIOGRAPHY; IR ULTRASOUND GUIDANCE VASC ACCESS RIGHT; PELVIC SELECTIVE ARTERIOGRAPHY MEDICATIONS: None ANESTHESIA/SEDATION: Moderate (conscious) sedation was employed during this procedure. A total of Versed 2 mg and Fentanyl 100 mcg was administered  intravenously. Moderate Sedation Time: 115 minutes. The patient's level of consciousness and vital signs were monitored continuously by radiology nursing throughout the procedure under my direct supervision. CONTRAST:  46 mL Visipaque 320 FLUOROSCOPY TIME:  Fluoroscopy Time: 19 minutes 12 seconds (911 mGy). COMPLICATIONS: None immediate. PROCEDURE: Informed consent was obtained from the patient following explanation of the procedure, risks, benefits and alternatives. The patient understands, agrees and consents for the procedure. All questions were addressed. A time out was performed prior to the initiation of the procedure. Maximal barrier sterile technique utilized including caps, mask, sterile gowns, sterile gloves, large sterile drape, hand hygiene, and Betadine prep. The right common femoral artery was interrogated with ultrasound and found to be widely patent. An image was obtained and stored for the medical record. Local anesthesia was attained by infiltration with 1% lidocaine. A small dermatotomy was made. Under real-time sonographic guidance, the vessel was punctured with a 21 gauge micropuncture needle. Using standard technique, the initial micro needle was exchanged over a 0.018 micro wire for a transitional 4 [place] micro sheath. The micro sheath was then exchanged over a 0.035 wire for a 5 French vascular sheath. A C2 cobra catheter was advanced of the abdominal aorta over a Bentson wire in used to select the celiac artery. A celiac arteriogram was performed. Conventional anatomy. No evidence of replaced middle colloid artery. The C2 catheter was next advanced into the superior mesenteric artery. A superior mesenteric arteriogram was performed using carbon dioxide gas. This resulted in suboptimal visualization. Therefore, arteriography was performed using Visipaque. The middle colloid artery arises proximally from the SMA. There is a visible marginal artery of [place] extending into the left colloid  artery and then the IMA. No definite filling of the aneurysm sac. An echelon 14 microcatheter was then successfully navigated over a Fathom 14 wire into the middle colloid artery. Arteriography confirms the catheter position. The catheter was next navigated into the marginal artery of [place]. Arteriography again performed confirming catheter  position and anatomy. The artery was then advanced into the left colloid artery. Arteriography again confirms catheter position and anatomy. Still no filling of the aneurysm sac. It is difficult to tell if the IMA is occluded or if there is outflow from the aneurysm sac. Finally, after significant effort, the microcatheter was successfully advanced into the SMA. Arteriography was performed. The origin of the SMA is occluded. There is a thin channel leading to the aneurysm sac in an filling the Vasa basal rim. There is no endoleak arising from the IMA. The catheter was removed. The C2 cobra catheter was readvanced through the sheath and into the internal iliac artery. An internal iliac arteriogram was performed. The ascending ileo lumbar artery is successfully identified. No evidence of right-sided lumbar artery endoleak. The C2 catheter was removed. A limited right common femoral arteriogram was performed confirming common femoral arterial access. Hemostasis was attained with the assistance of a 6 French Angio-Seal device. IMPRESSION: 1. 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 No evidence of type 2 endoleak arising from the IMA, or the right-sided lumbar arteries. Signed, Criselda Peaches, MD [facility] Radiology Electronically Signed   By: Jacqulynn Cadet M.D.   On: 11/24/2017 14:29   Ir Angiogram Selective Each Additional Vessel  Result Date: 11/24/2017 INDICATION: 83 year old male with an enlarging infrarenal abdominal aortic aneurysm despite prior  endovascular aortic repair. 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 Patient has significant chronic kidney disease and was admitted yesterday for pre-procedure hydration. He presents now for multi selective angiography and attempted endoleak repair. EXAM: SELECTIVE VISCERAL ARTERIOGRAPHY; ADDITIONAL ARTERIOGRAPHY; IR ULTRASOUND GUIDANCE VASC ACCESS RIGHT; PELVIC SELECTIVE ARTERIOGRAPHY MEDICATIONS: None ANESTHESIA/SEDATION: Moderate (conscious) sedation was employed during this procedure. A total of Versed 2 mg and Fentanyl 100 mcg was administered intravenously. Moderate Sedation Time: 115 minutes. The patient's level of consciousness and vital signs were monitored continuously by radiology nursing throughout the procedure under my direct supervision. CONTRAST:  46 mL Visipaque 320 FLUOROSCOPY TIME:  Fluoroscopy Time: 19 minutes 12 seconds (911 mGy). COMPLICATIONS: None immediate. PROCEDURE: Informed consent was obtained from the patient following explanation of the procedure, risks, benefits and alternatives. The patient understands, agrees and consents for the procedure. All questions were addressed. A time out was performed prior to the initiation of the procedure. Maximal barrier sterile technique utilized including caps, mask, sterile gowns, sterile gloves, large sterile drape, hand hygiene, and Betadine prep. The right common femoral artery was interrogated with ultrasound and found to be widely patent. An image was obtained and stored for the medical record. Local anesthesia was attained by infiltration with 1% lidocaine. A small dermatotomy was made. Under real-time sonographic guidance, the vessel was punctured with a 21 gauge micropuncture needle. Using standard technique, the initial micro needle was exchanged over a 0.018 micro wire for a transitional 4 [place] micro sheath. The micro sheath was then  exchanged over a 0.035 wire for a 5 French vascular sheath. A C2 cobra catheter was advanced of the abdominal aorta over a Bentson wire in used to select the celiac artery. A celiac arteriogram was performed. Conventional anatomy. No evidence of replaced middle colloid artery. The C2 catheter was next advanced into the superior mesenteric artery. A superior mesenteric arteriogram was performed using carbon dioxide gas. This resulted in suboptimal visualization. Therefore, arteriography was performed using Visipaque. The middle colloid  artery arises proximally from the SMA. There is a visible marginal artery of [place] extending into the left colloid artery and then the IMA. No definite filling of the aneurysm sac. An echelon 14 microcatheter was then successfully navigated over a Fathom 14 wire into the middle colloid artery. Arteriography confirms the catheter position. The catheter was next navigated into the marginal artery of [place]. Arteriography again performed confirming catheter position and anatomy. The artery was then advanced into the left colloid artery. Arteriography again confirms catheter position and anatomy. Still no filling of the aneurysm sac. It is difficult to tell if the IMA is occluded or if there is outflow from the aneurysm sac. Finally, after significant effort, the microcatheter was successfully advanced into the SMA. Arteriography was performed. The origin of the SMA is occluded. There is a thin channel leading to the aneurysm sac in an filling the Vasa basal rim. There is no endoleak arising from the IMA. The catheter was removed. The C2 cobra catheter was readvanced through the sheath and into the internal iliac artery. An internal iliac arteriogram was performed. The ascending ileo lumbar artery is successfully identified. No evidence of right-sided lumbar artery endoleak. The C2 catheter was removed. A limited right common femoral arteriogram was performed confirming common femoral  arterial access. Hemostasis was attained with the assistance of a 6 French Angio-Seal device. IMPRESSION: 1. 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 No evidence of type 2 endoleak arising from the IMA, or the right-sided lumbar arteries. Signed, Criselda Peaches, MD [facility] Radiology Electronically Signed   By: Jacqulynn Cadet M.D.   On: 11/24/2017 14:29   Ir Angiogram Selective Each Additional Vessel  Result Date: 11/24/2017 INDICATION: 83 year old male with an enlarging infrarenal abdominal aortic aneurysm despite prior endovascular aortic repair. 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 Patient has significant chronic kidney disease and was admitted yesterday for pre-procedure hydration. He presents now for multi selective angiography and attempted endoleak repair. EXAM: SELECTIVE VISCERAL ARTERIOGRAPHY; ADDITIONAL ARTERIOGRAPHY; IR ULTRASOUND GUIDANCE VASC ACCESS RIGHT; PELVIC SELECTIVE ARTERIOGRAPHY MEDICATIONS: None ANESTHESIA/SEDATION: Moderate (conscious) sedation was employed during this procedure. A total of Versed 2 mg and Fentanyl 100 mcg was administered intravenously. Moderate Sedation Time: 115 minutes. The patient's level of consciousness and vital signs were monitored continuously by radiology nursing throughout the procedure under my direct supervision. CONTRAST:  46 mL Visipaque 320 FLUOROSCOPY TIME:  Fluoroscopy Time: 19 minutes 12 seconds (911 mGy). COMPLICATIONS: None immediate. PROCEDURE: Informed consent was obtained from the patient following explanation of the procedure, risks, benefits and alternatives. The patient understands, agrees and consents for the procedure. All questions were addressed. A time out was performed prior to the initiation of the procedure.  Maximal barrier sterile technique utilized including caps, mask, sterile gowns, sterile gloves, large sterile drape, hand hygiene, and Betadine prep. The right common femoral artery was interrogated with ultrasound and found to be widely patent. An image was obtained and stored for the medical record. Local anesthesia was attained by infiltration with 1% lidocaine. A small dermatotomy was made. Under real-time sonographic guidance, the vessel was punctured with a 21 gauge micropuncture needle. Using standard technique, the initial micro needle was exchanged over a 0.018 micro wire for a transitional 4 [place] micro sheath. The micro sheath was then exchanged over a 0.035 wire for a 5 French vascular sheath.  A C2 cobra catheter was advanced of the abdominal aorta over a Bentson wire in used to select the celiac artery. A celiac arteriogram was performed. Conventional anatomy. No evidence of replaced middle colloid artery. The C2 catheter was next advanced into the superior mesenteric artery. A superior mesenteric arteriogram was performed using carbon dioxide gas. This resulted in suboptimal visualization. Therefore, arteriography was performed using Visipaque. The middle colloid artery arises proximally from the SMA. There is a visible marginal artery of [place] extending into the left colloid artery and then the IMA. No definite filling of the aneurysm sac. An echelon 14 microcatheter was then successfully navigated over a Fathom 14 wire into the middle colloid artery. Arteriography confirms the catheter position. The catheter was next navigated into the marginal artery of [place]. Arteriography again performed confirming catheter position and anatomy. The artery was then advanced into the left colloid artery. Arteriography again confirms catheter position and anatomy. Still no filling of the aneurysm sac. It is difficult to tell if the IMA is occluded or if there is outflow from the aneurysm sac. Finally, after  significant effort, the microcatheter was successfully advanced into the SMA. Arteriography was performed. The origin of the SMA is occluded. There is a thin channel leading to the aneurysm sac in an filling the Vasa basal rim. There is no endoleak arising from the IMA. The catheter was removed. The C2 cobra catheter was readvanced through the sheath and into the internal iliac artery. An internal iliac arteriogram was performed. The ascending ileo lumbar artery is successfully identified. No evidence of right-sided lumbar artery endoleak. The C2 catheter was removed. A limited right common femoral arteriogram was performed confirming common femoral arterial access. Hemostasis was attained with the assistance of a 6 French Angio-Seal device. IMPRESSION: 1. 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 No evidence of type 2 endoleak arising from the IMA, or the right-sided lumbar arteries. Signed, Criselda Peaches, MD [facility] Radiology Electronically Signed   By: Jacqulynn Cadet M.D.   On: 11/24/2017 14:29   Ir Angiogram Selective Each Additional Vessel  Result Date: 11/24/2017 INDICATION: 83 year old male with an enlarging infrarenal abdominal aortic aneurysm despite prior endovascular aortic repair. 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 Patient has significant chronic kidney disease and was admitted yesterday for pre-procedure hydration. He presents now for multi selective angiography and attempted endoleak repair. EXAM: SELECTIVE VISCERAL ARTERIOGRAPHY; ADDITIONAL ARTERIOGRAPHY; IR ULTRASOUND GUIDANCE VASC ACCESS RIGHT; PELVIC SELECTIVE ARTERIOGRAPHY MEDICATIONS: None ANESTHESIA/SEDATION: Moderate (conscious) sedation was employed during this procedure. A total of Versed 2 mg and Fentanyl 100  mcg was administered intravenously. Moderate Sedation Time: 115 minutes. The patient's level of consciousness and vital signs were monitored continuously by radiology nursing throughout the procedure under my direct supervision. CONTRAST:  46 mL Visipaque 320 FLUOROSCOPY TIME:  Fluoroscopy Time: 19 minutes 12 seconds (911 mGy). COMPLICATIONS: None immediate. PROCEDURE: Informed consent was obtained from the patient following explanation of the procedure, risks, benefits and alternatives. The patient understands, agrees and consents for the procedure. All questions were addressed. A time out was performed prior to the initiation of the procedure. Maximal barrier sterile technique utilized including caps, mask, sterile gowns, sterile gloves, large sterile drape, hand hygiene, and Betadine prep. The right common femoral artery was interrogated with ultrasound and found to be widely patent. An image was obtained and  stored for the medical record. Local anesthesia was attained by infiltration with 1% lidocaine. A small dermatotomy was made. Under real-time sonographic guidance, the vessel was punctured with a 21 gauge micropuncture needle. Using standard technique, the initial micro needle was exchanged over a 0.018 micro wire for a transitional 4 [place] micro sheath. The micro sheath was then exchanged over a 0.035 wire for a 5 French vascular sheath. A C2 cobra catheter was advanced of the abdominal aorta over a Bentson wire in used to select the celiac artery. A celiac arteriogram was performed. Conventional anatomy. No evidence of replaced middle colloid artery. The C2 catheter was next advanced into the superior mesenteric artery. A superior mesenteric arteriogram was performed using carbon dioxide gas. This resulted in suboptimal visualization. Therefore, arteriography was performed using Visipaque. The middle colloid artery arises proximally from the SMA. There is a visible marginal artery of [place] extending  into the left colloid artery and then the IMA. No definite filling of the aneurysm sac. An echelon 14 microcatheter was then successfully navigated over a Fathom 14 wire into the middle colloid artery. Arteriography confirms the catheter position. The catheter was next navigated into the marginal artery of [place]. Arteriography again performed confirming catheter position and anatomy. The artery was then advanced into the left colloid artery. Arteriography again confirms catheter position and anatomy. Still no filling of the aneurysm sac. It is difficult to tell if the IMA is occluded or if there is outflow from the aneurysm sac. Finally, after significant effort, the microcatheter was successfully advanced into the SMA. Arteriography was performed. The origin of the SMA is occluded. There is a thin channel leading to the aneurysm sac in an filling the Vasa basal rim. There is no endoleak arising from the IMA. The catheter was removed. The C2 cobra catheter was readvanced through the sheath and into the internal iliac artery. An internal iliac arteriogram was performed. The ascending ileo lumbar artery is successfully identified. No evidence of right-sided lumbar artery endoleak. The C2 catheter was removed. A limited right common femoral arteriogram was performed confirming common femoral arterial access. Hemostasis was attained with the assistance of a 6 French Angio-Seal device. IMPRESSION: 1. 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 No evidence of type 2 endoleak arising from the IMA, or the right-sided lumbar arteries. Signed, Criselda Peaches, MD [facility] Radiology Electronically Signed   By: Jacqulynn Cadet M.D.   On: 11/24/2017 14:29   Ir US Guide Vasc Access Right  Result Date: 11/24/2017 INDICATION: 83 year old male with an enlarging infrarenal abdominal aortic aneurysm despite  prior endovascular aortic repair. 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 Patient has significant chronic kidney disease and was admitted yesterday for pre-procedure hydration. He presents now for multi selective angiography and attempted endoleak repair. EXAM: SELECTIVE VISCERAL ARTERIOGRAPHY; ADDITIONAL ARTERIOGRAPHY; IR ULTRASOUND GUIDANCE VASC ACCESS RIGHT; PELVIC SELECTIVE ARTERIOGRAPHY MEDICATIONS: None ANESTHESIA/SEDATION: Moderate (conscious) sedation was employed during this procedure. A total of Versed 2 mg and Fentanyl 100 mcg was administered intravenously. Moderate Sedation Time: 115 minutes. The patient's level of consciousness and vital signs were monitored continuously by radiology nursing throughout the procedure under my direct supervision. CONTRAST:  46 mL Visipaque 320 FLUOROSCOPY TIME:  Fluoroscopy Time: 19 minutes 12 seconds (911 mGy). COMPLICATIONS: None immediate. PROCEDURE: Informed consent was obtained from the patient following explanation of the  procedure, risks, benefits and alternatives. The patient understands, agrees and consents for the procedure. All questions were addressed. A time out was performed prior to the initiation of the procedure. Maximal barrier sterile technique utilized including caps, mask, sterile gowns, sterile gloves, large sterile drape, hand hygiene, and Betadine prep. The right common femoral artery was interrogated with ultrasound and found to be widely patent. An image was obtained and stored for the medical record. Local anesthesia was attained by infiltration with 1% lidocaine. A small dermatotomy was made. Under real-time sonographic guidance, the vessel was punctured with a 21 gauge micropuncture needle. Using standard technique, the initial micro needle was exchanged over a 0.018 micro wire for a transitional 4 [place] micro sheath. The micro sheath was then  exchanged over a 0.035 wire for a 5 French vascular sheath. A C2 cobra catheter was advanced of the abdominal aorta over a Bentson wire in used to select the celiac artery. A celiac arteriogram was performed. Conventional anatomy. No evidence of replaced middle colloid artery. The C2 catheter was next advanced into the superior mesenteric artery. A superior mesenteric arteriogram was performed using carbon dioxide gas. This resulted in suboptimal visualization. Therefore, arteriography was performed using Visipaque. The middle colloid artery arises proximally from the SMA. There is a visible marginal artery of [place] extending into the left colloid artery and then the IMA. No definite filling of the aneurysm sac. An echelon 14 microcatheter was then successfully navigated over a Fathom 14 wire into the middle colloid artery. Arteriography confirms the catheter position. The catheter was next navigated into the marginal artery of [place]. Arteriography again performed confirming catheter position and anatomy. The artery was then advanced into the left colloid artery. Arteriography again confirms catheter position and anatomy. Still no filling of the aneurysm sac. It is difficult to tell if the IMA is occluded or if there is outflow from the aneurysm sac. Finally, after significant effort, the microcatheter was successfully advanced into the SMA. Arteriography was performed. The origin of the SMA is occluded. There is a thin channel leading to the aneurysm sac in an filling the Vasa basal rim. There is no endoleak arising from the IMA. The catheter was removed. The C2 cobra catheter was readvanced through the sheath and into the internal iliac artery. An internal iliac arteriogram was performed. The ascending ileo lumbar artery is successfully identified. No evidence of right-sided lumbar artery endoleak. The C2 catheter was removed. A limited right common femoral arteriogram was performed confirming common femoral  arterial access. Hemostasis was attained with the assistance of a 6 French Angio-Seal device. IMPRESSION: 1. 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 No evidence of type 2 endoleak arising from the IMA, or the right-sided lumbar arteries. Signed, Criselda Peaches, MD [facility] Radiology Electronically Signed   By: Jacqulynn Cadet M.D.   On: 11/24/2017 14:29   Dg Chest Port 1 View  Result Date: 11/26/2017 CLINICAL DATA:  Atelectasis EXAM: PORTABLE CHEST 1 VIEW COMPARISON:  11/25/2017 FINDINGS: Cardiomegaly with vascular congestion. Bilateral perihilar and lower lobe opacities likely reflect edema with small effusions. Improved aeration on the left since prior study. IMPRESSION: Cardiomegaly with bilateral perihilar and lower lobe opacities, likely edema/ CHF. Suspect small effusions. Improved aeration on the left. Electronically Signed   By: Rolm Baptise M.D.   On: 11/26/2017 07:38   Dg Chest Port 1 View  Result Date: 11/24/2017 CLINICAL DATA:  83 year old male with  history of shortness of breath. EXAM: PORTABLE CHEST 1 VIEW COMPARISON:  Chest x-ray 09/03/2017. FINDINGS: New area of airspace consolidation in the right mid to lower lung, not obscuring the right hemidiaphragm, potentially within either the right middle or lower lobe. No pleural effusions. Cephalization of pulmonary vasculature, without frank pulmonary edema. Mild diffuse peribronchial cuffing. Heart size is upper limits of normal. Aortic atherosclerosis. IMPRESSION: 1. Diffuse bronchial wall thickening concerning for an acute bronchitis. In addition, there is a new area of apparent airspace consolidation which could be either within the right middle lobe or right lower lobe, concerning for developing pneumonia. Followup PA and lateral chest X-ray is recommended in 3-4 weeks following trial of antibiotic therapy to ensure  resolution and exclude underlying malignancy. 2. Aortic atherosclerosis. Electronically Signed   By: Vinnie Langton M.D.   On: 11/24/2017 11:12    STUDIES:  CXR with bialteral opacities c/w edema, sign improved aeration on left.   SIGNIFICANT EVENTS    ASSESSMENT / PLAN:  A/P:  83 year old male with PMH of AAA repair presenting with concern for AAA endoleak that patient was given IVF for an angiogram to be done given CKI that developed pulmonary edema and hypoxemia.  Patient was diuresed but then subsequently had increased  O2 demand in AM.  CXR was performed that showed LLL atelectasis with a midline shift. He was placed on aggressive pulmonary hygiene with VEST/Chest PE/nebs . On 12/1 with significant improvement in Left lung aeration .   Hypoxemia:             - Titrate O2 for sat of 88-92%             - Doubt will need home O2.  LLL atelectasis:-Much improved              - Chest PT             - Vibra vest             - CXR in AM             - Mobilize as able                 - IS per RT protocol              - Flutter valve   Albuterol neb As needed      Pulmonary edema:             - diresis as able              - Strict I/O             - KVO IVF  Pulmonary infiltrate:             - No PNA, would not treat as patient does not appear toxic.  Mehul Rudin NP-C  [facility]  [phone]

## 2017-11-27 ENCOUNTER — Inpatient Hospital Stay (HOSPITAL_COMMUNITY): Payer: Medicare Other

## 2017-11-27 DIAGNOSIS — I4891 Unspecified atrial fibrillation: Secondary | ICD-10-CM

## 2017-11-27 DIAGNOSIS — N189 Chronic kidney disease, unspecified: Secondary | ICD-10-CM

## 2017-11-27 DIAGNOSIS — D61818 Other pancytopenia: Secondary | ICD-10-CM

## 2017-11-27 LAB — BASIC METABOLIC PANEL
ANION GAP: 7 (ref 5–15)
BUN: 52 mg/dL — AB (ref 6–20)
CHLORIDE: 102 mmol/L (ref 101–111)
CO2: 28 mmol/L (ref 22–32)
Calcium: 8.4 mg/dL — ABNORMAL LOW (ref 8.9–10.3)
Creatinine, Ser: 3.25 mg/dL — ABNORMAL HIGH (ref 0.61–1.24)
GFR calc Af Amer: 18 mL/min — ABNORMAL LOW (ref >60)
GFR, EST NON AFRICAN AMERICAN: 16 mL/min — AB (ref >60)
GLUCOSE: 114 mg/dL — AB (ref 65–99)
POTASSIUM: 3.9 mmol/L (ref 3.5–5.1)
Sodium: 137 mmol/L (ref 135–145)

## 2017-11-27 MED ORDER — GUAIFENESIN ER 600 MG PO TB12
600.0000 mg | ORAL_TABLET | Freq: Two times a day (BID) | ORAL | Status: DC
  Administered 2017-11-27 – 2017-11-29 (×5): 600 mg via ORAL
  Filled 2017-11-27 (×5): qty 1

## 2017-11-27 MED ORDER — ORAL CARE MOUTH RINSE
15.0000 mL | Freq: Two times a day (BID) | OROMUCOSAL | Status: DC
  Administered 2017-11-27 – 2017-11-29 (×5): 15 mL via OROMUCOSAL

## 2017-11-27 NOTE — Progress Notes (Signed)
[facility] PROGRESS NOTE  [name] WIO:[id number] DOB: [birth year] DOA: 11/23/2017  PCP: Colon Branch, MD  Brief History/Interval Summary: 83 year old Caucasian male with a past medical history of AAA with prior endovascular repair, history of renal cell carcinoma with solitary left kidney, history of hypertension, COPD was sent over from his nephrologist for evaluation of possible AAA endoleak.  Patient was referred to interventional radiology who recommended admission to the hospital.  Patient subsequently developed hypoxia.  Imaging studies suggested interstitial edema and left-sided mucous plugging.  Pulmonology and nephrology consulted.  Reason for Visit: Acute respiratory failure with hypoxia  Consultants: Interventional radiology.  Pulmonology.  Nephrology.  Cardiology  Procedures: Selective angiography which did not reveal any AAA endoleak.  Antibiotics: None  Subjective/Interval History: Patient states that he is feeling well.  He denies chest pain shortness of breath.  Does have a cough but unable to cough anything up.  His son-in-law is at the bedside.    ROS: Denies any headaches.  Objective:  Vital Signs  Vitals:   11/26/17 2200 11/27/17 0430 11/27/17 0500 11/27/17 0703  BP: (!) 109/51 (!) 115/55    Pulse: 61 63  64  Resp: 17 18  20   Temp: 98.5 F (36.9 C) 98.5 F (36.9 C)    TempSrc: Axillary Axillary    SpO2: 97% 92%  95%  Weight: 70.6 kg (155 lb 9.6 oz)  70.6 kg (155 lb 10.3 oz)   Height:        Intake/Output Summary (Last 24 hours) at 11/27/2017 0936 Last data filed at 11/27/2017 0600 Gross per 24 hour  Intake 420 ml  Output 1400 ml  Net -980 ml   Filed Weights   11/26/17 1047 11/26/17 2200 11/27/17 0500  Weight: 70.8 kg (156 lb) 70.6 kg (155 lb 9.6 oz) 70.6 kg (155 lb 10.3 oz)    General appearance: Awake alert.  In no distress. Resp: Continues to have improved aeration in his lungs.  Less crackles today compared to yesterday.  No  wheezing.  Effort appears to be normal.   Cardio: S1-S2 normal regular.  No S3-S4 GI: Abdomen remains soft.  Nontender nondistended.  Bowel sounds are present.  No masses organomegaly  Extremities: Edema is improved in lower extremities Neurologic:  No focal neurological deficit  Lab Results:  Data Reviewed: I have personally reviewed following labs and imaging studies  CBC: Recent Labs  Lab 11/23/17 1953 11/24/17 0623 11/25/17 0501 11/26/17 0523  WBC 3.2* 2.4* 4.7 3.6*  HGB 9.2* 8.7* 8.8* 8.7*  HCT 28.3* 27.5* 28.0* 27.5*  MCV 95.3 97.9 96.9 97.2  PLT 80* 80* 82* 74*    Basic Metabolic Panel: Recent Labs  Lab 11/23/17 1953 11/24/17 0623 11/25/17 0501 11/26/17 0523 11/27/17 0410  NA 139 142 140 137 137  K 3.6 3.8 3.8 3.7 3.9  CL 106 109 108 103 102  CO2 26 27 25 24 28   GLUCOSE 123* 95 103* 86 114*  BUN 32* 32* 37* 42* 52*  CREATININE 2.33* 2.44* 2.67* 2.81* 3.25*  CALCIUM 8.4* 8.2* 8.3* 8.2* 8.4*    GFR: Estimated Creatinine Clearance: 15.2 mL/min (A) (by C-G formula based on SCr of 3.25 mg/dL (H)).  Liver Function Tests: Recent Labs  Lab 11/23/17 1953  AST 19  ALT 7*  ALKPHOS 46  BILITOT 0.6  PROT 5.7*  ALBUMIN 3.5     Coagulation Profile: Recent Labs  Lab 11/23/17 1830  INR 1.27      Radiology Studies: Dg  Chest 2 View  Result Date: 11/25/2017 CLINICAL DATA:  Cough, shortness of breath. EXAM: CHEST  2 VIEW COMPARISON:  Radiograph of November 24, 2017. FINDINGS: Stable cardiomegaly. Atherosclerosis of thoracic aorta is noted. Significantly increased opacity of the left lung is noted most consistent with atelectasis and associated pleural effusion. Significantly increased right to left midline shift is noted most consistent with volume loss. Hyperexpansion of the right lung is noted with mild right basilar atelectasis. No pneumothorax is noted. Bony thorax is unremarkable. IMPRESSION: Significant right to left mediastinal shift is noted most  likely due to atelectasis and volume loss in the left hemithorax, with possible associated pleural effusion. Mild right basilar subsegmental atelectasis is noted. Electronically Signed   By: Marijo Conception, M.D.   On: 11/25/2017 10:21   Dg Chest Port 1 View  Result Date: 11/27/2017 CLINICAL DATA:  Respiratory failure EXAM: PORTABLE CHEST 1 VIEW COMPARISON:  11/26/2017 FINDINGS: Cardiomegaly with vascular congestion, small effusions and bibasilar atelectasis. Probable mild perihilar/interstitial edema. No real change since prior study. IMPRESSION: No interval change. Electronically Signed   By: Rolm Baptise M.D.   On: 11/27/2017 08:36   Dg Chest Port 1 View  Result Date: 11/26/2017 CLINICAL DATA:  Atelectasis EXAM: PORTABLE CHEST 1 VIEW COMPARISON:  11/25/2017 FINDINGS: Cardiomegaly with vascular congestion. Bilateral perihilar and lower lobe opacities likely reflect edema with small effusions. Improved aeration on the left since prior study. IMPRESSION: Cardiomegaly with bilateral perihilar and lower lobe opacities, likely edema/ CHF. Suspect small effusions. Improved aeration on the left. Electronically Signed   By: Rolm Baptise M.D.   On: 11/26/2017 07:38     Medications:  Scheduled: . Carbidopa-Levodopa ER  0.5 tablet Oral BID  . docusate sodium  100 mg Oral Daily  . donepezil  5 mg Oral QHS  . hydrALAZINE  25 mg Oral TID  . labetalol  100 mg Oral BID  . mouth rinse  15 mL Mouth Rinse BID  . prednisoLONE acetate  1 drop Both Eyes BID  . pregabalin  50 mg Oral QHS   Continuous:  WUX:LKGMWNUUV, fentaNYL (SUBLIMAZE) injection, hydrALAZINE, iodixanol, ondansetron (ZOFRAN) IV, oxyCODONE **OR** oxyCODONE  Assessment/Plan:  Active Problems:   Pressure injury of skin   Leaking abdominal aortic aneurysm (AAA) (HCC)    Acute respiratory failure with hypoxia/atelectasis/interstitial edema Patient developed hypoxia around the time of his procedure on 11/29.  His saturations were in the  mid 80s.  There was suspicion for fluid overload as the patient did have edema and he got fluids throughout the night.  Fluids were stopped and he was given high-dose Lasix.  He diuresed well.  Developed coughing episodes and x-ray was repeated.  This revealed significant new changes including right to left mediastinal shift with volume loss in the left lung possible atelectasis versus pleural effusion.  Considering the sudden change in 24 hrs this is most likely atelectasis/mucus plugging.  Pulmonology was consulted.  He was given Mucomyst and vibra vest and chest PT. Repeat x-ray today shows improvement in the aeration of the left lung however does suggest interstitial edema.  Hhe was given 1 dose of IV Lasix yesterday.  He had good urine output.  Examination shows improvement in aeration.  However his creatinine has risen some more.   Acute on chronic kidney disease stage IV Baseline creatinine around 2.4-2.8.  Patient given high-dose Lasix 11/29.  Lasix was held on 11/30.  Patient did not have much urine output on 11/30-12/1.  He was given  60 mg of IV Lasix on 12 urine output has improved.  However creatinine noted to be higher than yesterday.  Nephrology is following.  Will hold off on further doses of diuretic and defer to nephrology. Followed by nephrologist in [facility], Dr. Dimas Aguas.  Echocardiogram from 2016 showed grade 2 diastolic dysfunction.  Atrial fibrillation Appears to be a new diagnosis.  Called by RN due to slow heart rate.  Nephrologist also called saying that he was not seeing any P waves.  EKG was done and shows slow atrial fibrillation.  Discussed with cardiology who will consult on this patient.  Patient remains asymptomatic.  Due to bradycardia his beta-blocker has been stopped.  Check TSH.  Will get an echocardiogram.  History of AAA with concern for endoleak Seen by interventional radiology and underwent arteriography.  Did not show any endoleak which is reassuring.     History of essential hypertension Monitor blood pressures closely.  Continue home medication.  History of renal cell carcinoma with solitary kidney As above. Monitor urine output.  Anemia likely due to chronic kidney disease Hemoglobin stable.  No evidence of overt bleeding  Pancytopenia thought to be due to myelodysplastic syndrome versus autoimmune cytopenias All his counts are low based on previous labs.  He is followed by hematology in [facility]. Previous labs reviewed and he has chronic thrombocytopenia.    History of Parkinson's disease Continue home medications.  DVT Prophylaxis: SCDs    Code Status: Full code Family Communication: Discussed with patient's son-in-law had multiple questions and was very frustrated with all of these new issues that the patient has been having. Disposition Plan: Management as outlined above.    LOS: 4 days   [facility] Pager [phone] 11/27/2017, 9:36 AM  If 7PM-7AM, please contact night-coverage at [email or web address], password [facility]

## 2017-11-27 NOTE — Progress Notes (Signed)
Bladder scan done,13 ml of residual urine detected.

## 2017-11-27 NOTE — Progress Notes (Signed)
CCMD called,his heart rate at 40 beats /min.Assessed him,awake ,alert oriented x 4,sitting in recliner.Not in distress ,lung sounds clear and dimisnished, saturation 98% at  2 lpm/ncMinutes before that,his son in law called,asking why the patient still on oxygen and at the same time asking for his  breathing treatment.He was telling me that ,if he could not be ambulated asap he would taken the patient out from here.Requesting to talk somebody above me.Explained to him that I would not ambulating him immediately until his heart rate back to his baseline.Patient's son in law asked when the renal doctor would see the patient today.Explained to him maybe same hour as he did yesterday.Patient's son in law is more upset today as compare yesterday.M.D. called back and explained to me ,why they are upset,informed M.D. they might do an A.M.A.Respiratory therapy told this nurse that his lungs sound are pretty much clear. 98% at 1lpm/.[place]

## 2017-11-27 NOTE — Progress Notes (Signed)
Patient ID: [name], male   DOB: Nov 24, 1927, 83 y.o.   MRN: [id number]    Supervising Physician: Aletta Edouard  Patient Status: [facility] - In-pt  Chief Complaint: endoleak  Subjective: Patient is sleeping in his chair.  Son in law is concerned about his worsening kidney function and about any further interventions planned by Dr. Laurence Ferrari  Allergies: Iodinated diagnostic agents; Ioxaglate; and Baclofen  Medications: Prior to Admission medications   Medication Sig Start Date End Date Taking? Authorizing Provider  Carbidopa-Levodopa ER (SINEMET CR) 25-100 MG tablet controlled release Take 0.5 tablets by mouth 2 (two) times daily.   Yes [provider]  Cholecalciferol (VITAMIN D) 2000 units CAPS Take 2,000 Units by mouth daily.   Yes [provider]  docusate sodium (COLACE) 100 MG capsule Take 100 mg by mouth daily.   Yes [provider]  donepezil (ARICEPT) 5 MG tablet Take 5 mg by mouth at bedtime.   Yes [provider]  FeFum-FePo-FA-B Cmp-C-Zn-Mn-Cu (SE-TAN PLUS) 162-115.2-1 MG CAPS Take 1 capsule by mouth daily. Take 1 Tab  daily   Yes [provider]  hydrALAZINE (APRESOLINE) 25 MG tablet Take 25 mg by mouth 3 (three) times daily.   Yes [provider]  hydrochlorothiazide (HYDRODIURIL) 12.5 MG tablet Take 12.5 mg daily by mouth.   Yes [provider]  labetalol (NORMODYNE) 100 MG tablet Take 1 tablet (100 mg total) by mouth 2 (two) times daily. 07/14/15  Yes Paz, Alda Berthold, MD  prednisoLONE acetate (PRED FORTE) 1 % ophthalmic suspension Place 1 drop into both eyes 2 (two) times daily. 11/16/17  Yes [provider]  pregabalin (LYRICA) 50 MG capsule Take 50 mg by mouth at bedtime.    Yes [provider]  sodium polystyrene (KAYEXALATE) 15 GM/60ML suspension Take 15 g by mouth 2 (two) times a week. Take 15 Grams on Monday and Friday   Yes [provider]    Vital Signs: BP 115/65 (BP  Location: Right Arm)   Pulse 66   Temp 98.6 F (37 C) (Oral)   Resp 18   Ht 5\' 8"  (1.727 m)   Wt 155 lb 10.3 oz (70.6 kg)   SpO2 95%   BMI 23.67 kg/m   Physical Exam: Skin: R CFA site is c/d/i  Imaging: Dg Chest 2 View  Result Date: 11/25/2017 CLINICAL DATA:  Cough, shortness of breath. EXAM: CHEST  2 VIEW COMPARISON:  Radiograph of November 24, 2017. FINDINGS: Stable cardiomegaly. Atherosclerosis of thoracic aorta is noted. Significantly increased opacity of the left lung is noted most consistent with atelectasis and associated pleural effusion. Significantly increased right to left midline shift is noted most consistent with volume loss. Hyperexpansion of the right lung is noted with mild right basilar atelectasis. No pneumothorax is noted. Bony thorax is unremarkable. IMPRESSION: Significant right to left mediastinal shift is noted most likely due to atelectasis and volume loss in the left hemithorax, with possible associated pleural effusion. Mild right basilar subsegmental atelectasis is noted. Electronically Signed   By: Marijo Conception, M.D.   On: 11/25/2017 10:21   Ir Angiogram Visceral Selective  Result Date: 11/24/2017 INDICATION: 83 year old male with an enlarging infrarenal abdominal aortic aneurysm despite prior endovascular aortic repair. 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 Patient has significant chronic kidney disease and was admitted yesterday for pre-procedure hydration. He presents now for multi selective angiography and attempted  endoleak repair. EXAM: SELECTIVE VISCERAL ARTERIOGRAPHY; ADDITIONAL ARTERIOGRAPHY; IR ULTRASOUND GUIDANCE VASC ACCESS RIGHT; PELVIC SELECTIVE ARTERIOGRAPHY MEDICATIONS: None ANESTHESIA/SEDATION: Moderate (conscious) sedation was employed during this procedure. A total of Versed 2 mg and Fentanyl 100 mcg was administered intravenously. Moderate  Sedation Time: 115 minutes. The patient's level of consciousness and vital signs were monitored continuously by radiology nursing throughout the procedure under my direct supervision. CONTRAST:  46 mL Visipaque 320 FLUOROSCOPY TIME:  Fluoroscopy Time: 19 minutes 12 seconds (911 mGy). COMPLICATIONS: None immediate. PROCEDURE: Informed consent was obtained from the patient following explanation of the procedure, risks, benefits and alternatives. The patient understands, agrees and consents for the procedure. All questions were addressed. A time out was performed prior to the initiation of the procedure. Maximal barrier sterile technique utilized including caps, mask, sterile gowns, sterile gloves, large sterile drape, hand hygiene, and Betadine prep. The right common femoral artery was interrogated with ultrasound and found to be widely patent. An image was obtained and stored for the medical record. Local anesthesia was attained by infiltration with 1% lidocaine. A small dermatotomy was made. Under real-time sonographic guidance, the vessel was punctured with a 21 gauge micropuncture needle. Using standard technique, the initial micro needle was exchanged over a 0.018 micro wire for a transitional 4 [place] micro sheath. The micro sheath was then exchanged over a 0.035 wire for a 5 French vascular sheath. A C2 cobra catheter was advanced of the abdominal aorta over a Bentson wire in used to select the celiac artery. A celiac arteriogram was performed. Conventional anatomy. No evidence of replaced middle colloid artery. The C2 catheter was next advanced into the superior mesenteric artery. A superior mesenteric arteriogram was performed using carbon dioxide gas. This resulted in suboptimal visualization. Therefore, arteriography was performed using Visipaque. The middle colloid artery arises proximally from the SMA. There is a visible marginal artery of [place] extending into the left colloid artery and then the IMA.  No definite filling of the aneurysm sac. An echelon 14 microcatheter was then successfully navigated over a Fathom 14 wire into the middle colloid artery. Arteriography confirms the catheter position. The catheter was next navigated into the marginal artery of [place]. Arteriography again performed confirming catheter position and anatomy. The artery was then advanced into the left colloid artery. Arteriography again confirms catheter position and anatomy. Still no filling of the aneurysm sac. It is difficult to tell if the IMA is occluded or if there is outflow from the aneurysm sac. Finally, after significant effort, the microcatheter was successfully advanced into the SMA. Arteriography was performed. The origin of the SMA is occluded. There is a thin channel leading to the aneurysm sac in an filling the Vasa basal rim. There is no endoleak arising from the IMA. The catheter was removed. The C2 cobra catheter was readvanced through the sheath and into the internal iliac artery. An internal iliac arteriogram was performed. The ascending ileo lumbar artery is successfully identified. No evidence of right-sided lumbar artery endoleak. The C2 catheter was removed. A limited right common femoral arteriogram was performed confirming common femoral arterial access. Hemostasis was attained with the assistance of a 6 French Angio-Seal device. IMPRESSION: 1. 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 No evidence of type 2 endoleak arising from the IMA, or the right-sided lumbar arteries. Signed, Criselda Peaches, MD [facility] Radiology Electronically Signed   By: Jacqulynn Cadet M.D.   On: 11/24/2017 14:29  Ir Angiogram Visceral Selective  Result Date: 11/24/2017 INDICATION: 83 year old male with an enlarging infrarenal abdominal aortic aneurysm despite prior endovascular aortic repair. 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 Patient has significant chronic kidney disease and was admitted yesterday for pre-procedure hydration. He presents now for multi selective angiography and attempted endoleak repair. EXAM: SELECTIVE VISCERAL ARTERIOGRAPHY; ADDITIONAL ARTERIOGRAPHY; IR ULTRASOUND GUIDANCE VASC ACCESS RIGHT; PELVIC SELECTIVE ARTERIOGRAPHY MEDICATIONS: None ANESTHESIA/SEDATION: Moderate (conscious) sedation was employed during this procedure. A total of Versed 2 mg and Fentanyl 100 mcg was administered intravenously. Moderate Sedation Time: 115 minutes. The patient's level of consciousness and vital signs were monitored continuously by radiology nursing throughout the procedure under my direct supervision. CONTRAST:  46 mL Visipaque 320 FLUOROSCOPY TIME:  Fluoroscopy Time: 19 minutes 12 seconds (911 mGy). COMPLICATIONS: None immediate. PROCEDURE: Informed consent was obtained from the patient following explanation of the procedure, risks, benefits and alternatives. The patient understands, agrees and consents for the procedure. All questions were addressed. A time out was performed prior to the initiation of the procedure. Maximal barrier sterile technique utilized including caps, mask, sterile gowns, sterile gloves, large sterile drape, hand hygiene, and Betadine prep. The right common femoral artery was interrogated with ultrasound and found to be widely patent. An image was obtained and stored for the medical record. Local anesthesia was attained by infiltration with 1% lidocaine. A small dermatotomy was made. Under real-time sonographic guidance, the vessel was punctured with a 21 gauge micropuncture needle. Using standard technique, the initial micro needle was exchanged over a 0.018 micro wire for a transitional 4 [place] micro sheath. The micro sheath was then exchanged over a 0.035 wire for a 5  French vascular sheath. A C2 cobra catheter was advanced of the abdominal aorta over a Bentson wire in used to select the celiac artery. A celiac arteriogram was performed. Conventional anatomy. No evidence of replaced middle colloid artery. The C2 catheter was next advanced into the superior mesenteric artery. A superior mesenteric arteriogram was performed using carbon dioxide gas. This resulted in suboptimal visualization. Therefore, arteriography was performed using Visipaque. The middle colloid artery arises proximally from the SMA. There is a visible marginal artery of [place] extending into the left colloid artery and then the IMA. No definite filling of the aneurysm sac. An echelon 14 microcatheter was then successfully navigated over a Fathom 14 wire into the middle colloid artery. Arteriography confirms the catheter position. The catheter was next navigated into the marginal artery of [place]. Arteriography again performed confirming catheter position and anatomy. The artery was then advanced into the left colloid artery. Arteriography again confirms catheter position and anatomy. Still no filling of the aneurysm sac. It is difficult to tell if the IMA is occluded or if there is outflow from the aneurysm sac. Finally, after significant effort, the microcatheter was successfully advanced into the SMA. Arteriography was performed. The origin of the SMA is occluded. There is a thin channel leading to the aneurysm sac in an filling the Vasa basal rim. There is no endoleak arising from the IMA. The catheter was removed. The C2 cobra catheter was readvanced through the sheath and into the internal iliac artery. An internal iliac arteriogram was performed. The ascending ileo lumbar artery is successfully identified. No evidence of right-sided lumbar artery endoleak. The C2 catheter was removed. A limited right common femoral arteriogram was performed confirming common femoral arterial access. Hemostasis was  attained with the assistance of a 6 French Angio-Seal device. IMPRESSION: 1. 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 No evidence of type 2 endoleak arising from the IMA, or the right-sided lumbar arteries. Signed, Criselda Peaches, MD [facility] Radiology Electronically Signed   By: Jacqulynn Cadet M.D.   On: 11/24/2017 14:29   Ir Angiogram Pelvis Selective Or Supraselective  Result Date: 11/24/2017 INDICATION: 83 year old male with an enlarging infrarenal abdominal aortic aneurysm despite prior endovascular aortic repair. 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 Patient has significant chronic kidney disease and was admitted yesterday for pre-procedure hydration. He presents now for multi selective angiography and attempted endoleak repair. EXAM: SELECTIVE VISCERAL ARTERIOGRAPHY; ADDITIONAL ARTERIOGRAPHY; IR ULTRASOUND GUIDANCE VASC ACCESS RIGHT; PELVIC SELECTIVE ARTERIOGRAPHY MEDICATIONS: None ANESTHESIA/SEDATION: Moderate (conscious) sedation was employed during this procedure. A total of Versed 2 mg and Fentanyl 100 mcg was administered intravenously. Moderate Sedation Time: 115 minutes. The patient's level of consciousness and vital signs were monitored continuously by radiology nursing throughout the procedure under my direct supervision. CONTRAST:  46 mL Visipaque 320 FLUOROSCOPY TIME:  Fluoroscopy Time: 19 minutes 12 seconds (911 mGy). COMPLICATIONS: None immediate. PROCEDURE: Informed consent was obtained from the patient following explanation of the procedure, risks, benefits and alternatives. The patient understands, agrees and consents for the procedure. All questions were addressed. A time out was performed prior to the initiation of the procedure. Maximal barrier sterile technique  utilized including caps, mask, sterile gowns, sterile gloves, large sterile drape, hand hygiene, and Betadine prep. The right common femoral artery was interrogated with ultrasound and found to be widely patent. An image was obtained and stored for the medical record. Local anesthesia was attained by infiltration with 1% lidocaine. A small dermatotomy was made. Under real-time sonographic guidance, the vessel was punctured with a 21 gauge micropuncture needle. Using standard technique, the initial micro needle was exchanged over a 0.018 micro wire for a transitional 4 [place] micro sheath. The micro sheath was then exchanged over a 0.035 wire for a 5 French vascular sheath. A C2 cobra catheter was advanced of the abdominal aorta over a Bentson wire in used to select the celiac artery. A celiac arteriogram was performed. Conventional anatomy. No evidence of replaced middle colloid artery. The C2 catheter was next advanced into the superior mesenteric artery. A superior mesenteric arteriogram was performed using carbon dioxide gas. This resulted in suboptimal visualization. Therefore, arteriography was performed using Visipaque. The middle colloid artery arises proximally from the SMA. There is a visible marginal artery of [place] extending into the left colloid artery and then the IMA. No definite filling of the aneurysm sac. An echelon 14 microcatheter was then successfully navigated over a Fathom 14 wire into the middle colloid artery. Arteriography confirms the catheter position. The catheter was next navigated into the marginal artery of [place]. Arteriography again performed confirming catheter position and anatomy. The artery was then advanced into the left colloid artery. Arteriography again confirms catheter position and anatomy. Still no filling of the aneurysm sac. It is difficult to tell if the IMA is occluded or if there is outflow from the aneurysm sac. Finally, after significant effort, the  microcatheter was successfully advanced into the SMA. Arteriography was performed. The origin of the SMA is occluded. There is a thin channel leading to the aneurysm sac in an filling the Vasa  basal rim. There is no endoleak arising from the IMA. The catheter was removed. The C2 cobra catheter was readvanced through the sheath and into the internal iliac artery. An internal iliac arteriogram was performed. The ascending ileo lumbar artery is successfully identified. No evidence of right-sided lumbar artery endoleak. The C2 catheter was removed. A limited right common femoral arteriogram was performed confirming common femoral arterial access. Hemostasis was attained with the assistance of a 6 French Angio-Seal device. IMPRESSION: 1. 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 No evidence of type 2 endoleak arising from the IMA, or the right-sided lumbar arteries. Signed, Criselda Peaches, MD [facility] Radiology Electronically Signed   By: Jacqulynn Cadet M.D.   On: 11/24/2017 14:29   Ir Angiogram Selective Each Additional Vessel  Result Date: 11/24/2017 INDICATION: 83 year old male with an enlarging infrarenal abdominal aortic aneurysm despite prior endovascular aortic repair. 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 Patient has significant chronic kidney disease and was admitted yesterday for pre-procedure hydration. He presents now for multi selective angiography and attempted endoleak repair. EXAM: SELECTIVE VISCERAL ARTERIOGRAPHY; ADDITIONAL ARTERIOGRAPHY; IR ULTRASOUND GUIDANCE VASC ACCESS RIGHT; PELVIC SELECTIVE ARTERIOGRAPHY MEDICATIONS: None ANESTHESIA/SEDATION: Moderate (conscious) sedation was employed during this procedure. A total of Versed 2 mg and Fentanyl 100 mcg was administered  intravenously. Moderate Sedation Time: 115 minutes. The patient's level of consciousness and vital signs were monitored continuously by radiology nursing throughout the procedure under my direct supervision. CONTRAST:  46 mL Visipaque 320 FLUOROSCOPY TIME:  Fluoroscopy Time: 19 minutes 12 seconds (911 mGy). COMPLICATIONS: None immediate. PROCEDURE: Informed consent was obtained from the patient following explanation of the procedure, risks, benefits and alternatives. The patient understands, agrees and consents for the procedure. All questions were addressed. A time out was performed prior to the initiation of the procedure. Maximal barrier sterile technique utilized including caps, mask, sterile gowns, sterile gloves, large sterile drape, hand hygiene, and Betadine prep. The right common femoral artery was interrogated with ultrasound and found to be widely patent. An image was obtained and stored for the medical record. Local anesthesia was attained by infiltration with 1% lidocaine. A small dermatotomy was made. Under real-time sonographic guidance, the vessel was punctured with a 21 gauge micropuncture needle. Using standard technique, the initial micro needle was exchanged over a 0.018 micro wire for a transitional 4 [place] micro sheath. The micro sheath was then exchanged over a 0.035 wire for a 5 French vascular sheath. A C2 cobra catheter was advanced of the abdominal aorta over a Bentson wire in used to select the celiac artery. A celiac arteriogram was performed. Conventional anatomy. No evidence of replaced middle colloid artery. The C2 catheter was next advanced into the superior mesenteric artery. A superior mesenteric arteriogram was performed using carbon dioxide gas. This resulted in suboptimal visualization. Therefore, arteriography was performed using Visipaque. The middle colloid artery arises proximally from the SMA. There is a visible marginal artery of [place] extending into the left colloid  artery and then the IMA. No definite filling of the aneurysm sac. An echelon 14 microcatheter was then successfully navigated over a Fathom 14 wire into the middle colloid artery. Arteriography confirms the catheter position. The catheter was next navigated into the marginal artery of [place]. Arteriography again performed confirming catheter position and anatomy. The artery was then advanced into the left colloid artery.  Arteriography again confirms catheter position and anatomy. Still no filling of the aneurysm sac. It is difficult to tell if the IMA is occluded or if there is outflow from the aneurysm sac. Finally, after significant effort, the microcatheter was successfully advanced into the SMA. Arteriography was performed. The origin of the SMA is occluded. There is a thin channel leading to the aneurysm sac in an filling the Vasa basal rim. There is no endoleak arising from the IMA. The catheter was removed. The C2 cobra catheter was readvanced through the sheath and into the internal iliac artery. An internal iliac arteriogram was performed. The ascending ileo lumbar artery is successfully identified. No evidence of right-sided lumbar artery endoleak. The C2 catheter was removed. A limited right common femoral arteriogram was performed confirming common femoral arterial access. Hemostasis was attained with the assistance of a 6 French Angio-Seal device. IMPRESSION: 1. 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 No evidence of type 2 endoleak arising from the IMA, or the right-sided lumbar arteries. Signed, Criselda Peaches, MD [facility] Radiology Electronically Signed   By: Jacqulynn Cadet M.D.   On: 11/24/2017 14:29   Ir Angiogram Selective Each Additional Vessel  Result Date: 11/24/2017 INDICATION: 83 year old male with an enlarging infrarenal abdominal aortic aneurysm despite prior  endovascular aortic repair. 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 Patient has significant chronic kidney disease and was admitted yesterday for pre-procedure hydration. He presents now for multi selective angiography and attempted endoleak repair. EXAM: SELECTIVE VISCERAL ARTERIOGRAPHY; ADDITIONAL ARTERIOGRAPHY; IR ULTRASOUND GUIDANCE VASC ACCESS RIGHT; PELVIC SELECTIVE ARTERIOGRAPHY MEDICATIONS: None ANESTHESIA/SEDATION: Moderate (conscious) sedation was employed during this procedure. A total of Versed 2 mg and Fentanyl 100 mcg was administered intravenously. Moderate Sedation Time: 115 minutes. The patient's level of consciousness and vital signs were monitored continuously by radiology nursing throughout the procedure under my direct supervision. CONTRAST:  46 mL Visipaque 320 FLUOROSCOPY TIME:  Fluoroscopy Time: 19 minutes 12 seconds (911 mGy). COMPLICATIONS: None immediate. PROCEDURE: Informed consent was obtained from the patient following explanation of the procedure, risks, benefits and alternatives. The patient understands, agrees and consents for the procedure. All questions were addressed. A time out was performed prior to the initiation of the procedure. Maximal barrier sterile technique utilized including caps, mask, sterile gowns, sterile gloves, large sterile drape, hand hygiene, and Betadine prep. The right common femoral artery was interrogated with ultrasound and found to be widely patent. An image was obtained and stored for the medical record. Local anesthesia was attained by infiltration with 1% lidocaine. A small dermatotomy was made. Under real-time sonographic guidance, the vessel was punctured with a 21 gauge micropuncture needle. Using standard technique, the initial micro needle was exchanged over a 0.018 micro wire for a transitional 4 [place] micro sheath. The micro sheath was then  exchanged over a 0.035 wire for a 5 French vascular sheath. A C2 cobra catheter was advanced of the abdominal aorta over a Bentson wire in used to select the celiac artery. A celiac arteriogram was performed. Conventional anatomy. No evidence of replaced middle colloid artery. The C2 catheter was next advanced into the superior mesenteric artery. A superior mesenteric arteriogram was performed using carbon dioxide gas. This resulted in suboptimal visualization. Therefore, arteriography was performed using Visipaque. The middle colloid artery arises proximally from the SMA. There is a visible marginal artery of  [place] extending into the left colloid artery and then the IMA. No definite filling of the aneurysm sac. An echelon 14 microcatheter was then successfully navigated over a Fathom 14 wire into the middle colloid artery. Arteriography confirms the catheter position. The catheter was next navigated into the marginal artery of [place]. Arteriography again performed confirming catheter position and anatomy. The artery was then advanced into the left colloid artery. Arteriography again confirms catheter position and anatomy. Still no filling of the aneurysm sac. It is difficult to tell if the IMA is occluded or if there is outflow from the aneurysm sac. Finally, after significant effort, the microcatheter was successfully advanced into the SMA. Arteriography was performed. The origin of the SMA is occluded. There is a thin channel leading to the aneurysm sac in an filling the Vasa basal rim. There is no endoleak arising from the IMA. The catheter was removed. The C2 cobra catheter was readvanced through the sheath and into the internal iliac artery. An internal iliac arteriogram was performed. The ascending ileo lumbar artery is successfully identified. No evidence of right-sided lumbar artery endoleak. The C2 catheter was removed. A limited right common femoral arteriogram was performed confirming common femoral  arterial access. Hemostasis was attained with the assistance of a 6 French Angio-Seal device. IMPRESSION: 1. 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 No evidence of type 2 endoleak arising from the IMA, or the right-sided lumbar arteries. Signed, Criselda Peaches, MD [facility] Radiology Electronically Signed   By: Jacqulynn Cadet M.D.   On: 11/24/2017 14:29   Ir Angiogram Selective Each Additional Vessel  Result Date: 11/24/2017 INDICATION: 83 year old male with an enlarging infrarenal abdominal aortic aneurysm despite prior endovascular aortic repair. 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 Patient has significant chronic kidney disease and was admitted yesterday for pre-procedure hydration. He presents now for multi selective angiography and attempted endoleak repair. EXAM: SELECTIVE VISCERAL ARTERIOGRAPHY; ADDITIONAL ARTERIOGRAPHY; IR ULTRASOUND GUIDANCE VASC ACCESS RIGHT; PELVIC SELECTIVE ARTERIOGRAPHY MEDICATIONS: None ANESTHESIA/SEDATION: Moderate (conscious) sedation was employed during this procedure. A total of Versed 2 mg and Fentanyl 100 mcg was administered intravenously. Moderate Sedation Time: 115 minutes. The patient's level of consciousness and vital signs were monitored continuously by radiology nursing throughout the procedure under my direct supervision. CONTRAST:  46 mL Visipaque 320 FLUOROSCOPY TIME:  Fluoroscopy Time: 19 minutes 12 seconds (911 mGy). COMPLICATIONS: None immediate. PROCEDURE: Informed consent was obtained from the patient following explanation of the procedure, risks, benefits and alternatives. The patient understands, agrees and consents for the procedure. All questions were addressed. A time out was performed prior to the initiation of the procedure.  Maximal barrier sterile technique utilized including caps, mask, sterile gowns, sterile gloves, large sterile drape, hand hygiene, and Betadine prep. The right common femoral artery was interrogated with ultrasound and found to be widely patent. An image was obtained and stored for the medical record. Local anesthesia was attained by infiltration with 1% lidocaine. A small dermatotomy was made. Under real-time sonographic guidance, the vessel was punctured with a 21 gauge micropuncture needle. Using standard technique, the initial micro needle was exchanged over a 0.018 micro wire for a transitional 4 [place] micro sheath. The micro sheath was then exchanged over a 0.035 wire for a 5 French vascular sheath. A C2 cobra catheter was advanced of the abdominal aorta over a Bentson  wire in used to select the celiac artery. A celiac arteriogram was performed. Conventional anatomy. No evidence of replaced middle colloid artery. The C2 catheter was next advanced into the superior mesenteric artery. A superior mesenteric arteriogram was performed using carbon dioxide gas. This resulted in suboptimal visualization. Therefore, arteriography was performed using Visipaque. The middle colloid artery arises proximally from the SMA. There is a visible marginal artery of [place] extending into the left colloid artery and then the IMA. No definite filling of the aneurysm sac. An echelon 14 microcatheter was then successfully navigated over a Fathom 14 wire into the middle colloid artery. Arteriography confirms the catheter position. The catheter was next navigated into the marginal artery of [place]. Arteriography again performed confirming catheter position and anatomy. The artery was then advanced into the left colloid artery. Arteriography again confirms catheter position and anatomy. Still no filling of the aneurysm sac. It is difficult to tell if the IMA is occluded or if there is outflow from the aneurysm sac. Finally, after  significant effort, the microcatheter was successfully advanced into the SMA. Arteriography was performed. The origin of the SMA is occluded. There is a thin channel leading to the aneurysm sac in an filling the Vasa basal rim. There is no endoleak arising from the IMA. The catheter was removed. The C2 cobra catheter was readvanced through the sheath and into the internal iliac artery. An internal iliac arteriogram was performed. The ascending ileo lumbar artery is successfully identified. No evidence of right-sided lumbar artery endoleak. The C2 catheter was removed. A limited right common femoral arteriogram was performed confirming common femoral arterial access. Hemostasis was attained with the assistance of a 6 French Angio-Seal device. IMPRESSION: 1. 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 No evidence of type 2 endoleak arising from the IMA, or the right-sided lumbar arteries. Signed, Criselda Peaches, MD [facility] Radiology Electronically Signed   By: Jacqulynn Cadet M.D.   On: 11/24/2017 14:29   Ir Angiogram Selective Each Additional Vessel  Result Date: 11/24/2017 INDICATION: 83 year old male with an enlarging infrarenal abdominal aortic aneurysm despite prior endovascular aortic repair. 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 Patient has significant chronic kidney disease and was admitted yesterday for pre-procedure hydration. He presents now for multi selective angiography and attempted endoleak repair. EXAM: SELECTIVE VISCERAL ARTERIOGRAPHY; ADDITIONAL ARTERIOGRAPHY; IR ULTRASOUND GUIDANCE VASC ACCESS RIGHT; PELVIC SELECTIVE ARTERIOGRAPHY MEDICATIONS: None ANESTHESIA/SEDATION: Moderate (conscious) sedation was employed during this procedure. A total of Versed 2 mg and Fentanyl 100  mcg was administered intravenously. Moderate Sedation Time: 115 minutes. The patient's level of consciousness and vital signs were monitored continuously by radiology nursing throughout the procedure under my direct supervision. CONTRAST:  46 mL Visipaque 320 FLUOROSCOPY TIME:  Fluoroscopy Time: 19 minutes 12 seconds (911 mGy). COMPLICATIONS: None immediate. PROCEDURE: Informed consent was obtained from the patient following explanation of the procedure, risks, benefits and alternatives. The patient understands, agrees and consents for the procedure. All questions were addressed. A time out was performed prior to the initiation of the procedure. Maximal barrier sterile technique utilized including caps, mask, sterile gowns, sterile gloves, large sterile drape, hand hygiene, and Betadine prep. The right common femoral artery was interrogated with ultrasound and found to be widely patent. An image was obtained and stored for the medical record. Local anesthesia was attained by infiltration with 1%  lidocaine. A small dermatotomy was made. Under real-time sonographic guidance, the vessel was punctured with a 21 gauge micropuncture needle. Using standard technique, the initial micro needle was exchanged over a 0.018 micro wire for a transitional 4 [place] micro sheath. The micro sheath was then exchanged over a 0.035 wire for a 5 French vascular sheath. A C2 cobra catheter was advanced of the abdominal aorta over a Bentson wire in used to select the celiac artery. A celiac arteriogram was performed. Conventional anatomy. No evidence of replaced middle colloid artery. The C2 catheter was next advanced into the superior mesenteric artery. A superior mesenteric arteriogram was performed using carbon dioxide gas. This resulted in suboptimal visualization. Therefore, arteriography was performed using Visipaque. The middle colloid artery arises proximally from the SMA. There is a visible marginal artery of Drum[place] extending  into the left colloid artery and then the IMA. No definite filling of the aneurysm sac. An echelon 14 microcatheter was then successfully navigated over a Fathom 14 wire into the middle colloid artery. Arteriography confirms the catheter position. The catheter was next navigated into the marginal artery of [place]. Arteriography again performed confirming catheter position and anatomy. The artery was then advanced into the left colloid artery. Arteriography again confirms catheter position and anatomy. Still no filling of the aneurysm sac. It is difficult to tell if the IMA is occluded or if there is outflow from the aneurysm sac. Finally, after significant effort, the microcatheter was successfully advanced into the SMA. Arteriography was performed. The origin of the SMA is occluded. There is a thin channel leading to the aneurysm sac in an filling the Vasa basal rim. There is no endoleak arising from the IMA. The catheter was removed. The C2 cobra catheter was readvanced through the sheath and into the internal iliac artery. An internal iliac arteriogram was performed. The ascending ileo lumbar artery is successfully identified. No evidence of right-sided lumbar artery endoleak. The C2 catheter was removed. A limited right common femoral arteriogram was performed confirming common femoral arterial access. Hemostasis was attained with the assistance of a 6 French Angio-Seal device. IMPRESSION: 1. 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 No evidence of type 2 endoleak arising from the IMA, or the right-sided lumbar arteries. Signed, Criselda Peaches, MD [facility] Radiology Electronically Signed   By: Jacqulynn Cadet M.D.   On: 11/24/2017 14:29   Ir US Guide Vasc Access Right  Result Date: 11/24/2017 INDICATION: 83 year old male with an enlarging infrarenal abdominal aortic aneurysm despite  prior endovascular aortic repair. 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 Patient has significant chronic kidney disease and was admitted yesterday for pre-procedure hydration. He presents now for multi selective angiography and attempted endoleak repair. EXAM: SELECTIVE VISCERAL ARTERIOGRAPHY; ADDITIONAL ARTERIOGRAPHY; IR ULTRASOUND GUIDANCE VASC ACCESS RIGHT; PELVIC SELECTIVE ARTERIOGRAPHY MEDICATIONS: None ANESTHESIA/SEDATION: Moderate (conscious) sedation was employed during this procedure. A total of Versed 2 mg and Fentanyl 100 mcg was administered intravenously. Moderate Sedation Time: 115 minutes. The patient's level of consciousness and vital signs were monitored continuously by radiology nursing throughout the procedure under my direct supervision. CONTRAST:  46 mL Visipaque 320 FLUOROSCOPY TIME:  Fluoroscopy Time: 19 minutes 12 seconds (911 mGy). COMPLICATIONS: None immediate. PROCEDURE: Informed consent was obtained from the patient following explanation of the procedure, risks, benefits and alternatives. The patient understands, agrees and consents for the  procedure. All questions were addressed. A time out was performed prior to the initiation of the procedure. Maximal barrier sterile technique utilized including caps, mask, sterile gowns, sterile gloves, large sterile drape, hand hygiene, and Betadine prep. The right common femoral artery was interrogated with ultrasound and found to be widely patent. An image was obtained and stored for the medical record. Local anesthesia was attained by infiltration with 1% lidocaine. A small dermatotomy was made. Under real-time sonographic guidance, the vessel was punctured with a 21 gauge micropuncture needle. Using standard technique, the initial micro needle was exchanged over a 0.018 micro wire for a transitional 4 [place] micro sheath. The micro sheath was then  exchanged over a 0.035 wire for a 5 French vascular sheath. A C2 cobra catheter was advanced of the abdominal aorta over a Bentson wire in used to select the celiac artery. A celiac arteriogram was performed. Conventional anatomy. No evidence of replaced middle colloid artery. The C2 catheter was next advanced into the superior mesenteric artery. A superior mesenteric arteriogram was performed using carbon dioxide gas. This resulted in suboptimal visualization. Therefore, arteriography was performed using Visipaque. The middle colloid artery arises proximally from the SMA. There is a visible marginal artery of [place] extending into the left colloid artery and then the IMA. No definite filling of the aneurysm sac. An echelon 14 microcatheter was then successfully navigated over a Fathom 14 wire into the middle colloid artery. Arteriography confirms the catheter position. The catheter was next navigated into the marginal artery of [place]. Arteriography again performed confirming catheter position and anatomy. The artery was then advanced into the left colloid artery. Arteriography again confirms catheter position and anatomy. Still no filling of the aneurysm sac. It is difficult to tell if the IMA is occluded or if there is outflow from the aneurysm sac. Finally, after significant effort, the microcatheter was successfully advanced into the SMA. Arteriography was performed. The origin of the SMA is occluded. There is a thin channel leading to the aneurysm sac in an filling the Vasa basal rim. There is no endoleak arising from the IMA. The catheter was removed. The C2 cobra catheter was readvanced through the sheath and into the internal iliac artery. An internal iliac arteriogram was performed. The ascending ileo lumbar artery is successfully identified. No evidence of right-sided lumbar artery endoleak. The C2 catheter was removed. A limited right common femoral arteriogram was performed confirming common femoral  arterial access. Hemostasis was attained with the assistance of a 6 French Angio-Seal device. IMPRESSION: 1. 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 No evidence of type 2 endoleak arising from the IMA, or the right-sided lumbar arteries. Signed, Criselda Peaches, MD [facility] Radiology Electronically Signed   By: Jacqulynn Cadet M.D.   On: 11/24/2017 14:29   Dg Chest Port 1 View  Result Date: 11/27/2017 CLINICAL DATA:  Respiratory failure EXAM: PORTABLE CHEST 1 VIEW COMPARISON:  11/26/2017 FINDINGS: Cardiomegaly with vascular congestion, small effusions and bibasilar atelectasis. Probable mild perihilar/interstitial edema. No real change since prior study. IMPRESSION: No interval change. Electronically Signed   By: Rolm Baptise M.D.   On: 11/27/2017 08:36   Dg Chest Port 1 View  Result Date: 11/26/2017 CLINICAL DATA:  Atelectasis EXAM: PORTABLE CHEST 1 VIEW COMPARISON:  11/25/2017 FINDINGS: Cardiomegaly with vascular congestion. Bilateral perihilar and lower lobe opacities likely reflect edema with small effusions. Improved aeration on the left since prior study. IMPRESSION: Cardiomegaly  with bilateral perihilar and lower lobe opacities, likely edema/ CHF. Suspect small effusions. Improved aeration on the left. Electronically Signed   By: Rolm Baptise M.D.   On: 11/26/2017 07:38   Dg Chest Port 1 View  Result Date: 11/24/2017 CLINICAL DATA:  83 year old male with history of shortness of breath. EXAM: PORTABLE CHEST 1 VIEW COMPARISON:  Chest x-ray 09/03/2017. FINDINGS: New area of airspace consolidation in the right mid to lower lung, not obscuring the right hemidiaphragm, potentially within either the right middle or lower lobe. No pleural effusions. Cephalization of pulmonary vasculature, without frank pulmonary edema. Mild diffuse peribronchial cuffing. Heart size is upper limits  of normal. Aortic atherosclerosis. IMPRESSION: 1. Diffuse bronchial wall thickening concerning for an acute bronchitis. In addition, there is a new area of apparent airspace consolidation which could be either within the right middle lobe or right lower lobe, concerning for developing pneumonia. Followup PA and lateral chest X-ray is recommended in 3-4 weeks following trial of antibiotic therapy to ensure resolution and exclude underlying malignancy. 2. Aortic atherosclerosis. Electronically Signed   By: Vinnie Langton M.D.   On: 11/24/2017 11:12    Labs:  CBC: Recent Labs    11/23/17 1953 11/24/17 0623 11/25/17 0501 11/26/17 0523  WBC 3.2* 2.4* 4.7 3.6*  HGB 9.2* 8.7* 8.8* 8.7*  HCT 28.3* 27.5* 28.0* 27.5*  PLT 80* 80* 82* 74*    COAGS: Recent Labs    11/23/17 1830  INR 1.27    BMP: Recent Labs    11/24/17 0623 11/25/17 0501 11/26/17 0523 11/27/17 0410  NA 142 140 137 137  K 3.8 3.8 3.7 3.9  CL 109 108 103 102  CO2 27 25 24 28   GLUCOSE 95 103* 86 114*  BUN 32* 37* 42* 52*  CALCIUM 8.2* 8.3* 8.2* 8.4*  CREATININE 2.44* 2.67* 2.81* 3.25*  GFRNONAA 22* 20* 19* 16*  GFRAA 26* 23* 22* 18*    LIVER FUNCTION TESTS: Recent Labs    11/23/17 1953  BILITOT 0.6  AST 19  ALT 7*  ALKPHOS 46  PROT 5.7*  ALBUMIN 3.5    Assessment and Plan: 1. S/p mesenteric angiogram   No endoleak found and so no intervention done. Family is concerned about if the patient can really tolerate any further intervention plans previously discussed with them by Dr. Laurence Ferrari.  He will likely require an admission for rehab from here once he is stable for DC from this admit.  I think their concerns are certainly reasonable given his response to this procedure and admission.    2. CKD: Worsening kidney function.  Appreciate medicine and nephrology's assistance.  Fluid overload is improving.  Electronically Signed: Henreitta Cea 11/27/2017, 12:19 PM   I spent a total of 15 Minutes at  the the patient's bedside AND on the patient's hospital floor or unit, greater than 50% of which was counseling/coordinating care for endoleak

## 2017-11-27 NOTE — Progress Notes (Signed)
Wean off from oxygen.93% room air saturation post 15 minutes after removal of oxygen.

## 2017-11-27 NOTE — Progress Notes (Signed)
[facility] Progress Note  Subjective: creat up again today. 1400 UOP yesterday. Rhythm irreg and slow today, getting labetalol and hydralazine. No c/o, denies any SOB.    Vitals:   11/27/17 0500 11/27/17 0703 11/27/17 0924 11/27/17 1114  BP:   115/65   Pulse:  64 66   Resp:  20 18   Temp:   98.6 F (37 C)   TempSrc:   Oral   SpO2:  95% 96% 95%  Weight: 70.6 kg (155 lb 10.3 oz)     Height:        Inpatient medications: . Carbidopa-Levodopa ER  0.5 tablet Oral BID  . docusate sodium  100 mg Oral Daily  . donepezil  5 mg Oral QHS  . guaiFENesin  600 mg Oral BID  . mouth rinse  15 mL Mouth Rinse BID  . prednisoLONE acetate  1 drop Both Eyes BID    albuterol, ondansetron (ZOFRAN) IV, oxyCODONE **OR** oxyCODONE  Exam: Gen elderly HOH male, no distress, in chair No jvd or bruits Chest minimal crackles R base, L clear RRR no MRG  Abd soft ntnd no mass or ascites +bs  GU normal male w condom cath in place defer MS no joint effusions or deformity Ext 1-2+ dependent edema Neuro is sleepy but easily awakens, NF, ox 2  No UA CXR 12/01 > Cardiomegaly with bilateral perihilar and lower lobe opacities, likely edema/ CHF. Suspect small effusions.  CXR 12/02 - improved CHF  Inpt meds: mucomyst, sinemet CR, colace, aricept, fentanyl, lasix IV, hydralazine 25 mg po tid, HTCZ 12.5 mg /d, labealol 100 bid, versed, lyrica, albuterol, versed,    Impression: 1. Acute kidney injury - due to contrast nephropathy most likely. Not improving. Making urine.  Check bladder scan and renal US.  Contrast injury usually will recover in 3-7 days. Have d/w family, small but real chance of progressing to needing dialysis, but no need for dialysis at this point. DC'd lyrica for now as need to avoid sedating meds w/ potential for uremia in next 48 hrs.  2. CKD baseline creat 2.1- 2.5, stage IV ckd.  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  3. Vol / pulm edema/ dyspnea - improved, CXR  better. Still has edema but w/ climbing creat , will hold further lasix.  Last dose 12/1 am.   4. Bradycardia - afib on EKG, defer to primary.  Have dc'd the low dose labetalol.  5. Anemia - mesenteric angiogram did not show any signs of endoleak, this was reason for admission.  6. HTN - low normal, dc'd labetalol for low HR.  DC hydralazine and see if higher BP's help renal situation.  7. Dementia/ Parkinson's    Plan - as above. D/W son-in-law and daughter on the phone, questions answered.    Kelly Splinter MD [facility] pager [phone]   11/27/2017, 2:31 PM   Recent Labs  Lab 11/25/17 0501 11/26/17 0523 11/27/17 0410  NA 140 137 137  K 3.8 3.7 3.9  CL 108 103 102  CO2 25 24 28   GLUCOSE 103* 86 114*  BUN 37* 42* 52*  CREATININE 2.67* 2.81* 3.25*  CALCIUM 8.3* 8.2* 8.4*   Recent Labs  Lab 11/23/17 1953  AST 19  ALT 7*  ALKPHOS 46  BILITOT 0.6  PROT 5.7*  ALBUMIN 3.5   Recent Labs  Lab 11/24/17 0623 11/25/17 0501 11/26/17 0523  WBC 2.4* 4.7 3.6*  HGB 8.7* 8.8* 8.7*  HCT 27.5* 28.0* 27.5*  MCV 97.9 96.9 97.2  PLT 80* 82* 74*   Iron/TIBC/Ferritin/ %Sat    Component Value Date/Time   IRON 66 06/09/2009 0909   FERRITIN 90.2 06/09/2009 0909

## 2017-11-27 NOTE — Progress Notes (Signed)
[facility] Progress Note  Subjective: creat up again today. 1400 UOP yesterday. Rhythm irreg and slow today, getting labetalol and hydralazine. No c/o, denies any SOB.    Vitals:   11/27/17 0500 11/27/17 0703 11/27/17 0924 11/27/17 1114  BP:   115/65   Pulse:  64 66   Resp:  20 18   Temp:   98.6 F (37 C)   TempSrc:   Oral   SpO2:  95% 96% 95%  Weight: 70.6 kg (155 lb 10.3 oz)     Height:        Inpatient medications: . Carbidopa-Levodopa ER  0.5 tablet Oral BID  . docusate sodium  100 mg Oral Daily  . donepezil  5 mg Oral QHS  . guaiFENesin  600 mg Oral BID  . hydrALAZINE  25 mg Oral TID  . mouth rinse  15 mL Mouth Rinse BID  . prednisoLONE acetate  1 drop Both Eyes BID  . pregabalin  50 mg Oral QHS    albuterol, fentaNYL (SUBLIMAZE) injection, hydrALAZINE, iodixanol, ondansetron (ZOFRAN) IV, oxyCODONE **OR** oxyCODONE  Exam: Gen elderly HOH male, no distress, in chair No jvd or bruits Chest minimal crackles R base, L clear RRR no MRG  Abd soft ntnd no mass or ascites +bs  GU normal male w condom cath in place defer MS no joint effusions or deformity Ext 1-2+ dependent edema Neuro is sleepy but easily awakens, NF, ox 2  No UA CXR 12/01 > Cardiomegaly with bilateral perihilar and lower lobe opacities, likely edema/ CHF. Suspect small effusions.  CXR 12/02 - improved CHF  Inpt meds: mucomyst, sinemet CR, colace, aricept, fentanyl, lasix IV, hydralazine 25 mg po tid, HTCZ 12.5 mg /d, labealol 100 bid, versed, lyrica, albuterol, versed,    Impression: 1. Acute kidney injury - due to contrast nephropathy most likely. Not improving. Making urine.  Check bladder scan and renal US.  Contrast injury usually will recover in 3-7 days. Have d/w family, small but real chance of progressing to needing dialysis, but no need for dialysis at this point.  2. CKD baseline creat 2.1- 2.5, stage IV ckd.  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  3. Vol / pulm  edema/ dyspnea - improved, CXR better. Still has edema but w/ climbing creat , will hold further lasix.  Last dose 12/1 am.   4. Bradycardia - afib on EKG, defer to primary.  Have dc'd the low dose labetalol.  5. Anemia - mesenteric angiogram did not show any signs of endoleak, this was reason for admission.  6. HTN - low normal, dc'd labetalol for low HR.  DC hydralazine and see if higher BP's help renal situation.  7. Dementia/ Parkinson's    Plan - as above. D/W son-in-law and daughter on the phone, questions answered.    Kelly Splinter MD [facility] pager [phone]   11/27/2017, 2:21 PM   Recent Labs  Lab 11/25/17 0501 11/26/17 0523 11/27/17 0410  NA 140 137 137  K 3.8 3.7 3.9  CL 108 103 102  CO2 25 24 28   GLUCOSE 103* 86 114*  BUN 37* 42* 52*  CREATININE 2.67* 2.81* 3.25*  CALCIUM 8.3* 8.2* 8.4*   Recent Labs  Lab 11/23/17 1953  AST 19  ALT 7*  ALKPHOS 46  BILITOT 0.6  PROT 5.7*  ALBUMIN 3.5   Recent Labs  Lab 11/24/17 0623 11/25/17 0501 11/26/17 0523  WBC 2.4* 4.7 3.6*  HGB 8.7* 8.8* 8.7*  HCT  27.5* 28.0* 27.5*  MCV 97.9 96.9 97.2  PLT 80* 82* 74*   Iron/TIBC/Ferritin/ %Sat    Component Value Date/Time   IRON 66 06/09/2009 0909   FERRITIN 90.2 06/09/2009 0909

## 2017-11-28 ENCOUNTER — Encounter (HOSPITAL_COMMUNITY): Payer: Self-pay | Admitting: Cardiology

## 2017-11-28 ENCOUNTER — Inpatient Hospital Stay (HOSPITAL_COMMUNITY): Payer: Medicare Other

## 2017-11-28 DIAGNOSIS — I714 Abdominal aortic aneurysm, without rupture: Principal | ICD-10-CM

## 2017-11-28 DIAGNOSIS — Q600 Renal agenesis, unilateral: Secondary | ICD-10-CM

## 2017-11-28 DIAGNOSIS — I10 Essential (primary) hypertension: Secondary | ICD-10-CM

## 2017-11-28 DIAGNOSIS — C649 Malignant neoplasm of unspecified kidney, except renal pelvis: Secondary | ICD-10-CM

## 2017-11-28 DIAGNOSIS — I480 Paroxysmal atrial fibrillation: Secondary | ICD-10-CM

## 2017-11-28 DIAGNOSIS — Z9981 Dependence on supplemental oxygen: Secondary | ICD-10-CM

## 2017-11-28 DIAGNOSIS — N179 Acute kidney failure, unspecified: Secondary | ICD-10-CM

## 2017-11-28 DIAGNOSIS — D638 Anemia in other chronic diseases classified elsewhere: Secondary | ICD-10-CM

## 2017-11-28 DIAGNOSIS — D696 Thrombocytopenia, unspecified: Secondary | ICD-10-CM

## 2017-11-28 DIAGNOSIS — J449 Chronic obstructive pulmonary disease, unspecified: Secondary | ICD-10-CM

## 2017-11-28 DIAGNOSIS — I4891 Unspecified atrial fibrillation: Secondary | ICD-10-CM

## 2017-11-28 DIAGNOSIS — F0390 Unspecified dementia without behavioral disturbance: Secondary | ICD-10-CM

## 2017-11-28 DIAGNOSIS — T82330A Leakage of aortic (bifurcation) graft (replacement), initial encounter: Secondary | ICD-10-CM

## 2017-11-28 DIAGNOSIS — N183 Chronic kidney disease, stage 3 (moderate): Secondary | ICD-10-CM

## 2017-11-28 LAB — BASIC METABOLIC PANEL
ANION GAP: 9 (ref 5–15)
BUN: 60 mg/dL — ABNORMAL HIGH (ref 6–20)
CALCIUM: 8.1 mg/dL — AB (ref 8.9–10.3)
CO2: 25 mmol/L (ref 22–32)
CREATININE: 3.33 mg/dL — AB (ref 0.61–1.24)
Chloride: 101 mmol/L (ref 101–111)
GFR, EST AFRICAN AMERICAN: 18 mL/min — AB (ref >60)
GFR, EST NON AFRICAN AMERICAN: 15 mL/min — AB (ref >60)
GLUCOSE: 108 mg/dL — AB (ref 65–99)
Potassium: 4.3 mmol/L (ref 3.5–5.1)
Sodium: 135 mmol/L (ref 135–145)

## 2017-11-28 LAB — TSH: TSH: 2.836 u[IU]/mL (ref 0.350–4.500)

## 2017-11-28 LAB — ECHOCARDIOGRAM COMPLETE
HEIGHTINCHES: 68 in
WEIGHTICAEL: 2497.37 [oz_av]

## 2017-11-28 MED ORDER — DARBEPOETIN ALFA 100 MCG/0.5ML IJ SOSY
100.0000 ug | PREFILLED_SYRINGE | Freq: Once | INTRAMUSCULAR | Status: AC
  Administered 2017-11-29: 100 ug via SUBCUTANEOUS
  Filled 2017-11-28 (×2): qty 0.5

## 2017-11-28 NOTE — Progress Notes (Signed)
  Echocardiogram 2D Echocardiogram has been performed.  [name] 11/28/2017, 5:03 PM

## 2017-11-28 NOTE — Progress Notes (Signed)
I have received a call from P.T. To discuss pt's progress with therapies today. They are currently recommending an inpt rehab admission. I have text ed Dr. [name] to request an inpt rehab consult. [phone]

## 2017-11-28 NOTE — Progress Notes (Signed)
Physical Therapy Treatment Patient Details Name: [name] MRN: [id number] DOB: August 16, 1927 Today's Date: 11/28/2017    History of Present Illness Pt is an 83 y/o male admitted directly from nephrologist for AAA endoleak repair by IR. Post procedure pt with mild hypoxia and required supplemental O2. CXR demonstrated LLL atelectasis. PMH including but not limited to HTN, COPD, CKD and dementia.    PT Comments    Patient seen for mobility progression. Patient extremely engaged and pleasant during session. Daughter present throughout. Patient continues to require increased physical assist for all aspects of mobility but responds well to therapeutic cues. Patient able to initiate 3 bouts of gait training with assist. Facilitation techniques implored to improve gait mechanics, patient tolerated well with successful carry over. Give patients prior level of function (was working out at [employer], walking daily, and very active), current impairments, and motivation. Feel patient would benefit from comprehensive therapies. Patient now demonstrating improvements in overall activity tolerance and feel he would do well with intensive therapies. Highly recommend consideration for CIR. Recommendations updated. Daughter extremely and supportive and hopeful for rehabilitation.  Follow Up Recommendations  CIR;Supervision/Assistance - 24 hour     Equipment Recommendations  None recommended by PT    Recommendations for Other Services       Precautions / Restrictions Precautions Precautions: Fall Restrictions Weight Bearing Restrictions: No    Mobility  Bed Mobility               General bed mobility comments: received EOB  Transfers Overall transfer level: Needs assistance Equipment used: Rolling walker (2 wheeled) Transfers: Sit to/from [employer] Sit to Stand: Mod assist;+2 safety/equipment         General transfer comment: Moderate physical assist to power up to  stand x3, initial posterior bias noted, Multi modal cues for posture and positioning. With cues patient able to correct to upright and bring weight forward to improve positioning with RW  Ambulation/Gait Ambulation/Gait assistance: Mod assist;+2 physical assistance Ambulation Distance (Feet): 40 Feet(40 x3) Assistive device: Rolling walker (2 wheeled) Gait Pattern/deviations: Step-to pattern;Trunk flexed;Narrow base of support;Festinating;Decreased stride length Gait velocity: decreased   General Gait Details: Patient with very festinating gait and bilateral LEs buckling initially, VCs for increased gait speed and stide, cued to over exagerate steps with marked improvement in patients ability   Stairs            Wheelchair Mobility    Modified Rankin (Stroke Patients Only)       Balance Overall balance assessment: Needs assistance Sitting-balance support: Feet supported Sitting balance-Leahy Scale: Fair Sitting balance - Comments: able to sit edge of chair and maintain    Standing balance support: During functional activity;Bilateral upper extremity supported Standing balance-Leahy Scale: Poor Standing balance comment: required bilateral UE supports and mod-max A(posterior bias noted in upright)                            Cognition Arousal/Alertness: Awake/alert Behavior During Therapy: WFL for tasks assessed/performed Overall Cognitive Status: History of cognitive impairments - at baseline                                        Exercises      General Comments        Pertinent Vitals/Pain Pain Assessment: No/denies pain    Home Living  Prior Function            PT Goals (current goals can now be found in the care plan section) Acute Rehab PT Goals Patient Stated Goal: return to independence PT Goal Formulation: With patient/family Time For Goal Achievement: 12/09/17 Potential to Achieve Goals:  Good Progress towards PT goals: Progressing toward goals    Frequency    Min 3X/week      PT Plan Discharge plan needs to be updated    Co-evaluation PT/OT/SLP Co-Evaluation/Treatment: Yes Reason for Co-Treatment: For patient/therapist safety PT goals addressed during session: Mobility/safety with mobility        AM-PAC PT "6 Clicks" Daily Activity  Outcome Measure  Difficulty turning over in bed (including adjusting bedclothes, sheets and blankets)?: A Lot Difficulty moving from lying on back to sitting on the side of the bed? : Unable Difficulty sitting down on and standing up from a chair with arms (e.g., wheelchair, bedside commode, etc,.)?: Unable Help needed moving to and from a bed to chair (including a wheelchair)?: A Lot Help needed walking in hospital room?: A Lot Help needed climbing 3-5 steps with a railing? : Total 6 Click Score: 9    End of Session Equipment Utilized During Treatment: Gait belt Activity Tolerance: Patient limited by fatigue Patient left: in chair;with call bell/phone within reach;with chair alarm set;with family/visitor present Nurse Communication: Mobility status;Need for lift equipment PT Visit Diagnosis: Other abnormalities of gait and mobility (R26.89)     Time: 1040-1107 PT Time Calculation (min) (ACUTE ONLY): 27 min  Charges:  $Gait Training: 8-22 mins                    G Codes:       Alben Deeds, PT DPT  Board Certified Neurologic Specialist [place] 11/28/2017, 11:59 AM

## 2017-11-28 NOTE — Progress Notes (Signed)
PT Cancellation Note  Patient Details Name: [name] MRN: [id number] DOB: 1927-06-08   Cancelled Treatment:    Reason Eval/Treat Not Completed: Other (comment), attempted to see x2 this am, patient with MDs will reattempt shortly.  Alben Deeds, PT DPT  Board Certified Neurologic Specialist [place] 11/28/2017, 10:22 AM

## 2017-11-28 NOTE — PMR Pre-admission (Signed)
PMR Admission Coordinator Pre-Admission Assessment  Patient: [name] is an 83 y.o., male MRN: [id number] DOB: 02-20-1927 Height: 5\' 8"  (172.7 cm) Weight: 70.8 kg (156 lb 1.4 oz)              Insurance Information HMO:     PPO:      PCP:      IPA:      80/20: yes     OTHER: no HMO PRIMARY: Medicare a and b       Policy#: [id number]      Subscriber: pt CM Name:       Phone#:      Fax#:  Pre-Cert#:       Employer:  Benefits:  Phone #: passport one     Name: 11/28/2017 Eff. Date: 02/24/94     Deduct: $1340      Out of Pocket Max: none      Life Max: none CIR: 100%      SNF: 20 full days Outpatient: 80%     Co-Pay: 20% Home Health: 100%      Co-Pay: none DME: 80%     Co-Pay: 20% Providers: pt choice  SECONDARY: BCBS supplement      Policy#: [id number]      Subscriber: pt  Medicaid Application Date:       Case Manager:  Disability Application Date:       Case Worker:   Emergency Contact Information Contact Information    Name Relation Home Work [place] M Daughter  [phone]   Kemper Durie Relative   [phone]     Current Medical History  Patient Admitting Diagnosis:  Debility  History of Present Illness:  QQV:ZDGLO O Malloyis a 83 y.o.right handed malewith history significant for CKDstage IV, dementia, parkinsonian maintained on Sinemet, AAA with prior endovascular repair and angiogram 2009, renal cell carcinoma solitary left kidney, hypertension, COPD, chronic anemia with pancytopenia.Presented 11/23/2017 after seeing his nephrologist for possible AAA endoleak repair by interventional radiology. 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 Post procedure mildly hypoxic. Chest x-ray showed right lower lobe consolidation versus atelectasis. Patient remained on oxygen therapy and follow-up her pulmonary services. Patient recovered nicely and  pulmonary services signed off 11/28/2017. Renal services follow-up for CKDstage III with elevated creatinine. Renal ultrasound unremarkable left kidney. Latest creatinine 3.02 with expected slow recovery. On 11/28/2017 cardiology services consulted for new onset atrial fibrillation with slow ventricular response felt to be exacerbated by his current medical illness. Patient had no chest pain or shortness of breath. Echocardiogram completed 11/28/2017 showing ejection fraction of 60% no wall motion abnormalities. Patient not felt to be a Coumadin candidate due to fall risk as well with noted history of pancytopenia   Past Medical History  Past Medical History:  Diagnosis Date  . Abdominal aortic aneurysm ([place]) 09/2008   s/o endovascular repair and angiogram 10/04/08   . Anemia    EGD Cscope 08-2012  . Arthritis    "hips" (11/23/2017)  . Atrial fibrillation ([place])   . Bleeding stomach ulcer 1990s  . Bleeding ulcer 1997  . CKD (chronic kidney disease), stage IV ([facility])    Stable, Dr. Dimas Aguas  . COPD (chronic obstructive pulmonary disease) ([place])    "mild" (11/23/2017)  . Dementia    "on very small dose of Aricept" (11/23/2017)  . H/O hyperkalemia    Stable  . Hypertension   . Leukopenia 2/11  thought to be from doxy  . Pneumonia 2018 X 2  . Renal cell carcinoma    clear cell type dx 10/09, s/p excision 10-24-08  . Renal insufficiency   . Solitary left kidney    W/ chronic tubule-interstitial damage, Dr. Dimas Aguas  . Spinal stenosis    causing LE weakness-persistant, being worked up by Hydrographic surveyor and neurologist    Family History  family history includes Diabetes in his father; Hypertension in his father; Stroke in his father.  Prior Rehab/Hospitalizations:  Has the patient had major surgery during 100 days prior to admission? No  Current Medications   Current Facility-Administered Medications:  .  albuterol (PROVENTIL) (2.5 MG/3ML) 0.083% nebulizer solution 2.5 mg, 2.5  mg, Nebulization, Q4H PRN, Bonnielee Haff, MD, 2.5 mg at 11/29/17 0313 .  Carbidopa-Levodopa ER (SINEMET CR) 25-100 MG tablet controlled release 0.5 tablet, 0.5 tablet, Oral, BID, Hall, Carole N, DO, 0.5 tablet at 11/29/17 0826 .  docusate sodium (COLACE) capsule 100 mg, 100 mg, Oral, Daily, Irene Pap N, DO, 100 mg at 11/29/17 1610 .  donepezil (ARICEPT) tablet 5 mg, 5 mg, Oral, QHS, Hall, Carole N, DO, 5 mg at 11/28/17 2203 .  ferric gluconate (NULECIT) 125 mg in sodium chloride 0.9 % 100 mL IVPB, 125 mg, Intravenous, Daily, Corliss Parish, MD .  guaiFENesin (MUCINEX) 12 hr tablet 600 mg, 600 mg, Oral, BID, Bonnielee Haff, MD, 600 mg at 11/29/17 9604 .  hydrALAZINE (APRESOLINE) tablet 10 mg, 10 mg, Oral, Q8H, Corliss Parish, MD .  MEDLINE mouth rinse, 15 mL, Mouth Rinse, BID, Bonnielee Haff, MD, 15 mL at 11/29/17 0827 .  ondansetron (ZOFRAN) injection 4 mg, 4 mg, Intravenous, Q6H PRN, Hodierne, Adam, MD .  oxyCODONE (Oxy IR/ROXICODONE) immediate release tablet 5 mg, 5 mg, Oral, Once PRN **OR** oxyCODONE (ROXICODONE) 5 MG/5ML solution 5 mg, 5 mg, Oral, Once PRN, Hodierne, Adam, MD .  prednisoLONE acetate (PRED FORTE) 1 % ophthalmic suspension 1 drop, 1 drop, Both Eyes, BID, Hall, Carole N, DO, 1 drop at 11/29/17 0827  Patients Current Diet: Diet renal with fluid restriction Fluid restriction: 1200 mL Fluid; Room service appropriate? Yes; Fluid consistency: Thin  Precautions / Restrictions Precautions Precautions: Fall Restrictions Weight Bearing Restrictions: No   Has the patient had 2 or more falls or a fall with injury in the past year?No  Prior Activity Level Community (5-7x/wk): independent pta; Out pt therpay twice weekly; did not drive  Home Assistive Devices / Equipment Home Assistive Devices/Equipment: Eyeglasses, Dentures (specify type), Hearing aid, Grab bars in shower, Grab bars around toilet, Hand-held shower hose, Shower chair without back Home Equipment:  Environmental consultant - 2 wheels, Cane - single point, Bedside commode, Shower seat  Prior Device Use: Indicate devices/aids used by the patient prior to current illness, exacerbation or injury? None of the above  Prior Functional Level Prior Function Level of Independence: Independent Comments: pt's daughter drives him wherever he needs to go. Pt was receiving OP therapy services 2x/week  Self Care: Did the patient need help bathing, dressing, using the toilet or eating?  Independent  Indoor Mobility: Did the patient need assistance with walking from room to room (with or without device)? Independent  Stairs: Did the patient need assistance with internal or external stairs (with or without device)? Independent  Functional Cognition: Did the patient need help planning regular tasks such as shopping or remembering to take medications? Needed some help  Current Functional Level Cognition  Overall Cognitive Status: History of cognitive impairments -  at baseline Orientation Level: Oriented to person, Oriented to place, Oriented to situation General Comments: pt with dementia at baseline    Extremity Assessment (includes Sensation/Coordination)  Upper Extremity Assessment: Generalized weakness  Lower Extremity Assessment: Defer to PT evaluation    ADLs  Overall ADL's : Needs assistance/impaired Eating/Feeding: Set up, Sitting Grooming: Set up, Supervision/safety, Sitting, Wash/dry face, Wash/dry hands Upper Body Bathing: Min guard, Sitting Lower Body Bathing: Minimal assistance, Sitting/lateral leans Upper Body Dressing : Min guard, Sitting Lower Body Dressing: Moderate assistance, +2 for physical assistance, +2 for safety/equipment, Sit to/from stand Lower Body Dressing Details (indicate cue type and reason): Pt able to reach and adjust socks sitting in recliner  Toilet Transfer: +2 for safety/equipment, +2 for physical assistance, RW, Moderate assistance, Ambulation, Stand-pivot Toilet Transfer  Details (indicate cue type and reason): simulated in transfer to [place] and Hygiene: +2 for physical assistance, +2 for safety/equipment, Sit to/from stand, Moderate assistance Functional mobility during ADLs: +2 for physical assistance, +2 for safety/equipment, Rolling walker, Moderate assistance General ADL Comments: Pt with B knee buckling during transfer and ambulation requiring increased assist    Mobility  Overal bed mobility: Needs Assistance Bed Mobility: Supine to Sit Supine to sit: Min assist General bed mobility comments: pt up in recliner with PT upon arrival    Transfers  Overall transfer level: Needs assistance Equipment used: Rolling walker (2 wheeled) Transfers: Sit to/from Stand, [employer] Transfers Sit to Stand: Mod assist, +2 safety/equipment Stand pivot transfers: Max assist, +2 physical assistance, +2 safety/equipment General transfer comment: Moderate physical assist to power up to stand x3, initial posterior bias noted, Multi modal cues for posture and positioning. With cues patient able to correct to upright and bring weight forward to improve positioning with RW    Ambulation / Gait / Stairs / Wheelchair Mobility  Ambulation/Gait Ambulation/Gait assistance: Mod assist, +2 physical assistance Ambulation Distance (Feet): 40 Feet(40 x3) Assistive device: Rolling walker (2 wheeled) Gait Pattern/deviations: Step-to pattern, Trunk flexed, Narrow base of support, Festinating, Decreased stride length General Gait Details: Patient with very festinating gait and bilateral LEs buckling initially, VCs for increased gait speed and stide, cued to over exagerate steps with marked improvement in patients ability Gait velocity: decreased    Posture / Balance Dynamic Sitting Balance Sitting balance - Comments: able to sit edge of chair and maintain  Balance Overall balance assessment: Needs assistance Sitting-balance support: Feet  supported Sitting balance-Leahy Scale: Fair Sitting balance - Comments: able to sit edge of chair and maintain  Standing balance support: During functional activity, Bilateral upper extremity supported Standing balance-Leahy Scale: Poor Standing balance comment: required bilateral UE supports and mod-max A    Special needs/care consideration BiPAP/CPAP  N/a CPM  N/a Continuous Drip IV  N/a Dialysis n/a Life Vest  N/a Oxygen  N/a Special Bed  High low bed in use on acute Trach Size Wound Vac n/a Skin ecchymosis to BUE Bowel mgmt: LBM 11/29/2017 Bladder mgmt: continent Diabetic mgmt n/a Daughter requests that no meds be begun or discontinued without her knowledge for she keeps up with allo f his co morbidities     Previous Home Environment Living Arrangements: Alone  Lives With: Alone Available Help at Discharge: (daughter and son in law can provide 24/7 assist; they are ve) Type of Home: House Home Layout: One level Home Access: Stairs to enter [employer] of Steps: 1 Bathroom Shower/Tub: Multimedia programmer: Standard Bathroom Accessibility: Yes How Accessible: Accessible via  walker Home Care Services: No  Discharge Living Setting Plans for Discharge Living Setting: Patient's home, Alone Type of Home at Discharge: House Discharge Home Layout: One level Discharge Home Access: Stairs to enter Entrance Stairs-Rails: None Entrance Stairs-Number of Steps: 1 Discharge Bathroom Toilet: Standard Discharge Bathroom Accessibility: Yes Does the patient have any problems obtaining your medications?: No  Social/Family/Support Systems Patient Roles: Parent Contact Information: [name], daughter Anticipated Caregiver: daughter and son in law Anticipated Caregiver's Contact Information: see above Ability/Limitations of Caregiver: daughter and son in law work, but will arrange schedule to provide whatever care is recommended Caregiver Availability:  24/7 Discharge Plan Discussed with Primary Caregiver: Yes Is Caregiver In Agreement with Plan?: Yes Does Caregiver/Family have Issues with Lodging/Transportation while Pt is in Rehab?: No   Goals/Additional Needs Patient/Family Goal for Rehab: supervision to min assist with PT and OT, supervision with SLP Expected length of stay: [facility] 16-20 days Special Service Needs: Daughter requests no new meds are started or meds disconitnued without her knowledge(Daughter manages all his comorbidities and manages his meds) Pt/Family Agrees to Admission and willing to participate: Yes Program Orientation Provided & Reviewed with Pt/Caregiver Including Roles  & Responsibilities: Yes   Patient has a follow up appointment with his hematologist on 12/13/17 so daughter would like him to be discharged before then if it is feasible. She is then going to [place] on 12/19 until 12/18/17 with her brother, Absalom, to be pt's caregiver at that time.   Family wants to be sure his creatinine be monitored closely on CIR. Decrease burden of Care through IP rehab admission: n/a  Possible need for SNF placement upon discharge:not anticipated. [name] and her spouse cared for her Mother for 5 years as she was bedridden. She died 2 years ago. They are very hands on  And involved in patient's care.  Patient Condition: This patient's medical and functional status has changed since the consult dated 11/28/2017 in which the Rehabilitation Physician determined and documented that the patient was potentially appropriate for intensive rehabilitative care in an inpatient rehabilitation facility. Issues have been addressed and update has been discussed with Dr. Naaman Plummer and patient now appropriate for inpatient rehabilitation. Patient has demonstrated the ability to tolerate the intensity of an inpatient acute rehabilitation admission. Will admit to inpatient rehab today.   Preadmission Screen Completed By:  Cleatrice Burke,  11/29/2017 1:16 PM ______________________________________________________________________   Discussed status with Dr. Naaman Plummer on 11/29/2017 at  28  and received telephone approval for admission today.  Admission Coordinator:  Cleatrice Burke, time 2094 Date 11/29/2017

## 2017-11-28 NOTE — Progress Notes (Signed)
[facility] PROGRESS NOTE  [name] WJX:[id number] DOB: Feb 02, 1927 DOA: 11/23/2017  PCP: Colon Branch, MD  Brief History/Interval Summary: 83 year old Caucasian male with a past medical history of AAA with prior endovascular repair, history of renal cell carcinoma with solitary left kidney, history of hypertension, COPD was sent over from his nephrologist for evaluation of possible AAA endoleak.  Patient was referred to interventional radiology who recommended admission to the hospital.  Patient subsequently developed hypoxia.  Imaging studies suggested interstitial edema and left-sided mucous plugging.  Pulmonology and nephrology consulted.  Reason for Visit: Acute respiratory failure with hypoxia  Consultants: Interventional radiology.  Pulmonology.  Nephrology.  Cardiology  Procedures: Selective angiography which did not reveal any AAA endoleak.  Antibiotics: None  Subjective/Interval History: Patient not very communicative.  Denies any complaints this morning.  States that his breathing has improved.  Patient's daughter is at the bedside.  ROS: Denies any headaches.  Objective:  Vital Signs  Vitals:   11/27/17 2223 11/27/17 2245 11/28/17 0631 11/28/17 0747  BP: (!) 108/53  135/62 (!) 127/55  Pulse: 69  73 68  Resp: 17  18 18   Temp: 98.7 F (37.1 C)  98.4 F (36.9 C) 98.7 F (37.1 C)  TempSrc: Tympanic  Axillary Oral  SpO2: (!) 87% 91% 98% 96%  Weight: 70.8 kg (156 lb 1.4 oz)     Height:        Intake/Output Summary (Last 24 hours) at 11/28/2017 1318 Last data filed at 11/28/2017 0600 Gross per 24 hour  Intake 120 ml  Output 975 ml  Net -855 ml   Filed Weights   11/26/17 2200 11/27/17 0500 11/27/17 2223  Weight: 70.6 kg (155 lb 9.6 oz) 70.6 kg (155 lb 10.3 oz) 70.8 kg (156 lb 1.4 oz)    General appearance: Awake alert.  In no distress. Resp: Improved aeration bilaterally.  Very few crackles are today.  No wheezing.  Normal effort.    Cardio:  S1-S2 is irregularly irregular.  Mildly bradycardic. GI: Abdomen remains soft.  Nontender nondistended.  Bowel sounds are present.  No masses or organomegaly. Extremities: Edema is improved in lower extremities Neurologic:  No focal neurological deficit  Lab Results:  Data Reviewed: I have personally reviewed following labs and imaging studies  CBC: Recent Labs  Lab 11/23/17 1953 11/24/17 0623 11/25/17 0501 11/26/17 0523  WBC 3.2* 2.4* 4.7 3.6*  HGB 9.2* 8.7* 8.8* 8.7*  HCT 28.3* 27.5* 28.0* 27.5*  MCV 95.3 97.9 96.9 97.2  PLT 80* 80* 82* 74*    Basic Metabolic Panel: Recent Labs  Lab 11/24/17 0623 11/25/17 0501 11/26/17 0523 11/27/17 0410 11/28/17 0543  NA 142 140 137 137 135  K 3.8 3.8 3.7 3.9 4.3  CL 109 108 103 102 101  CO2 27 25 24 28 25   GLUCOSE 95 103* 86 114* 108*  BUN 32* 37* 42* 52* 60*  CREATININE 2.44* 2.67* 2.81* 3.25* 3.33*  CALCIUM 8.2* 8.3* 8.2* 8.4* 8.1*    GFR: Estimated Creatinine Clearance: 14.8 mL/min (A) (by C-G formula based on SCr of 3.33 mg/dL (H)).  Liver Function Tests: Recent Labs  Lab 11/23/17 1953  AST 19  ALT 7*  ALKPHOS 46  BILITOT 0.6  PROT 5.7*  ALBUMIN 3.5     Coagulation Profile: Recent Labs  Lab 11/23/17 1830  INR 1.27      Radiology Studies: US Renal  Result Date: 11/27/2017 CLINICAL DATA:  83 year old male with acute kidney injury. History of right  nephrectomy for renal cell carcinoma. EXAM: RENAL / URINARY TRACT ULTRASOUND COMPLETE COMPARISON:  11/25/1999 FINDINGS: Right Kidney: Not visualized compatible with nephrectomy. Left Kidney: Length: 11.1 cm. Renal echogenicity is normal. Multiple renal cysts are identified, the largest measuring 3.4 cm in the mid-upper left kidney. No hydronephrosis or solid mass identified. Bladder: Appears normal for degree of bladder distention. IMPRESSION: 1. Unremarkable left kidney except for renal cysts. 2. Status post right nephrectomy 3. Unremarkable bladder Electronically  Signed   By: Margarette Canada M.D.   On: 11/27/2017 16:57   Dg Chest Port 1 View  Result Date: 11/27/2017 CLINICAL DATA:  Respiratory failure EXAM: PORTABLE CHEST 1 VIEW COMPARISON:  11/26/2017 FINDINGS: Cardiomegaly with vascular congestion, small effusions and bibasilar atelectasis. Probable mild perihilar/interstitial edema. No real change since prior study. IMPRESSION: No interval change. Electronically Signed   By: Rolm Baptise M.D.   On: 11/27/2017 08:36     Medications:  Scheduled: . Carbidopa-Levodopa ER  0.5 tablet Oral BID  . darbepoetin (ARANESP) injection - NON-DIALYSIS  100 mcg Subcutaneous Once  . docusate sodium  100 mg Oral Daily  . donepezil  5 mg Oral QHS  . guaiFENesin  600 mg Oral BID  . mouth rinse  15 mL Mouth Rinse BID  . prednisoLONE acetate  1 drop Both Eyes BID   Continuous:  WVP:XTGGYIRSW, ondansetron (ZOFRAN) IV, oxyCODONE **OR** oxyCODONE  Assessment/Plan:  Active Problems:   Pressure injury of skin   Leaking abdominal aortic aneurysm (AAA) (HCC)    Acute respiratory failure with hypoxia/atelectasis/interstitial edema Patient developed hypoxia around the time of his procedure on 11/29.  His saturations were in the mid 80s.  There was suspicion for fluid overload as the patient did have edema and he got fluids throughout the night.  Fluids were stopped and he was given high-dose Lasix.  He diuresed well.  Developed coughing episodes and x-ray was repeated.  This revealed significant new changes including right to left mediastinal shift with volume loss in the left lung possible atelectasis versus pleural effusion.  Thought to be due to atelectasis/mucous plugging. Pulmonology was consulted.  He was given Mucomyst and vibra vest and chest PT. Repeat x-ray showed improvement in the aeration of the left lung.  He was given 1 dose of IV Lasix for pulmonary edema.  He had good urine output but then his creatinine bumped so he was not given any further doses of Lasix.   Have reasonably good urine output.  Examination shows improvement in aeration.  Pulmonology has signed off.  Acute on chronic kidney disease stage IV Baseline creatinine around 2.4-2.8.  Patient given high-dose Lasix 11/29.  Lasix was held on 11/30.  Patient did not have much urine output on 11/30-12/1.  He was then given 60 mg of IV Lasix.  His urine output improved.  Creatinine climbed which could be due to contrast.  Nephrology was consulted.  We appreciate their assistance.  Holding off on further doses of Lasix.  Check weight today.  Long discussion with patient's daughter. Followed by nephrologist in [facility], Dr. Dimas Aguas.  Echocardiogram from 2016 showed grade 2 diastolic dysfunction.  Atrial fibrillation Appears to be a new diagnosis.  Noted to be bradycardic.  However he is asymptomatic.  No previous history of same per daughter.  Cardiology was consulted.  TSH is normal.  Echocardiogram is pending.  Labetalol was discontinued due to heart rate being in the 40s.   History of AAA with concern for endoleak Seen by interventional  radiology and underwent arteriography.  Did not show any endoleak which is reassuring.    History of essential hypertension Blood pressure is labile.  Continue to monitor.  History of renal cell carcinoma with solitary kidney As above. Monitor urine output.  Anemia likely due to chronic kidney disease Hemoglobin stable.  No evidence of overt bleeding  Pancytopenia thought to be due to myelodysplastic syndrome versus autoimmune cytopenias All his counts are low based on previous labs.  He is followed by hematology in [facility]. Previous labs reviewed and he has chronic thrombocytopenia.    History of Parkinson's disease Continue home medications.  Seen by physical therapy.  CIR is recommended.  We will consult.  DVT Prophylaxis: SCDs    Code Status: Full code Family Communication: Discussed in detail with patient's daughter.   Disposition Plan:  Management as outlined above.  Await improvement in renal function.    LOS: 5 days   [facility] Pager [phone] 11/28/2017, 1:18 PM  If 7PM-7AM, please contact night-coverage at [email or web address], password [facility]

## 2017-11-28 NOTE — Progress Notes (Signed)
Occupational Therapy Treatment Patient Details Name: [name] MRN: [id number] DOB: Aug 04, 1927 Today's Date: 11/28/2017    History of present illness Pt is an 83 y/o male admitted directly from nephrologist for AAA endoleak repair by IR. Post procedure pt with mild hypoxia and required supplemental O2. CXR demonstrated LLL atelectasis. PMH including but not limited to HTN, COPD, CKD and dementia.   OT comments  Pt making progress with functional goals.  Patient very motivated and pleasant during session. Daughter present throughout. Patient continues to require increased physical assist for all aspects of mobility and LB ADLs but responds well to therapeutic cues. Patient tolerated tx well with successful carry over. Give patients prior level of function (was working out at [employer], walking daily, Independent with ADLs and very active), current impairments, and motivation. Feel patient would benefit from comprehensive therapies. Patient now demonstrating improvements in overall activity tolerance and feel he would do well with intensive therapies. Highly recommend consideration for CIR. Recommendations updated. Daughter extremely and supportive and hopeful for rehabilitation. OT will continue to follow acutely  Follow Up Recommendations  CIR;Supervision/Assistance - 24 hour    Equipment Recommendations  None recommended by OT(TBD at next venue of care)    Recommendations for Other Services      Precautions / Restrictions Precautions Precautions: Fall Restrictions Weight Bearing Restrictions: No       Mobility Bed Mobility               General bed mobility comments: pt up in recliner with PT upon arrival  Transfers Overall transfer level: Needs assistance Equipment used: Rolling walker (2 wheeled) Transfers: Sit to/from [facility] Sit to Stand: Mod assist;+2 safety/equipment Stand pivot transfers: Max assist;+2 physical assistance;+2  safety/equipment       General transfer comment: Moderate physical assist to power up to stand x3, initial posterior bias noted, Multi modal cues for posture and positioning. With cues patient able to correct to upright and bring weight forward to improve positioning with RW    Balance Overall balance assessment: Needs assistance Sitting-balance support: Feet supported Sitting balance-Leahy Scale: Fair Sitting balance - Comments: able to sit edge of chair and maintain    Standing balance support: During functional activity;Bilateral upper extremity supported Standing balance-Leahy Scale: Poor Standing balance comment: required bilateral UE supports and mod-max A                           ADL either performed or assessed with clinical judgement   ADL Overall ADL's : Needs assistance/impaired     Grooming: Set up;Supervision/safety;Sitting;Wash/dry face;Wash/dry hands   Upper Body Bathing: Min guard;Sitting   Lower Body Bathing: Minimal assistance;Sitting/lateral leans       Lower Body Dressing: Moderate assistance;+2 for physical assistance;+2 for safety/equipment;Sit to/from stand Lower Body Dressing Details (indicate cue type and reason): Pt able to reach and adjust socks sitting in recliner  Toilet Transfer: +2 for safety/equipment;+2 for physical assistance;RW;Moderate assistance;Ambulation;Stand-pivot Toilet Transfer Details (indicate cue type and reason): simulated in transfer to [place] and Hygiene: +2 for physical assistance;+2 for safety/equipment;Sit to/from stand;Moderate assistance       Functional mobility during ADLs: +2 for physical assistance;+2 for safety/equipment;Rolling walker;Moderate assistance General ADL Comments: Pt with B knee buckling during transfer and ambulation requiring increased assist     Vision Baseline Vision/History: Wears glasses Patient Visual Report: No change from baseline      Perception  Praxis      Cognition Arousal/Alertness: Awake/alert Behavior During Therapy: WFL for tasks assessed/performed Overall Cognitive Status: History of cognitive impairments - at baseline                                 General Comments: pt with dementia at baseline        Exercises     Shoulder Instructions       General Comments  pt very pleasant, cooperative an motivated    Pertinent Vitals/ Pain       Pain Assessment: No/denies pain  Home Living                                          Prior Functioning/Environment              Frequency  Min 2X/week        Progress Toward Goals  OT Goals(current goals can now be found in the care plan section)  Progress towards OT goals: Progressing toward goals  Acute Rehab OT Goals Patient Stated Goal: return to independence  Plan Discharge plan needs to be updated    Co-evaluation    PT/OT/SLP Co-Evaluation/Treatment: Yes Reason for Co-Treatment: For patient/therapist safety PT goals addressed during session: Mobility/safety with mobility OT goals addressed during session: ADL's and self-care;Proper use of Adaptive equipment and DME      AM-PAC PT "6 Clicks" Daily Activity     Outcome Measure   Help from another person eating meals?: None Help from another person taking care of personal grooming?: A Little Help from another person toileting, which includes using toliet, bedpan, or urinal?: A Lot Help from another person bathing (including washing, rinsing, drying)?: A Lot Help from another person to put on and taking off regular upper body clothing?: A Little Help from another person to put on and taking off regular lower body clothing?: A Lot 6 Click Score: 16    End of Session Equipment Utilized During Treatment: Gait belt;Rolling walker  OT Visit Diagnosis: Unsteadiness on feet (R26.81);Muscle weakness (generalized) (M62.81);Other abnormalities of gait  and mobility (R26.89)   Activity Tolerance Patient tolerated treatment well   Patient Left in chair;with call bell/phone within reach;with chair alarm set;with family/visitor present   Nurse Communication      Functional Assessment Tool Used: AM-PAC 6 Clicks Daily Activity   Time: 1007-1219 OT Time Calculation (min): 24 min  Charges: OT G-codes **NOT FOR INPATIENT CLASS** Functional Assessment Tool Used: AM-PAC 6 Clicks Daily Activity OT General Charges $OT Visit: 1 Visit OT Treatments $Therapeutic Activity: 8-22 mins     Britt Bottom 11/28/2017, 1:11 PM

## 2017-11-28 NOTE — Consult Note (Signed)
Physical Medicine and Rehabilitation Consult Reason for Consult: Decreased functional mobility Referring Physician: [facility]   HPI: [name] is a 83 y.o. right handed male with history significant for CKD stage III, dementia, parkinsonian maintained on Sinemet, AAA with prior endovascular repair and angiogram 2009, renal cell carcinoma solitary left kidney, hypertension, COPD, chronic anemia with pancytopenia. Per chart review and daughter, patient lives alone and independent. He does not drive. Family checks on him as needed. One level home. Presented 11/23/2017 after seeing his nephrologist for possible AAA endoleak repair by interventional radiology. 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 Postprocedure mildly hypoxic. Chest x-ray showed right lower lobe consolidation versus atelectasis. Patient remained on oxygen therapy and follow-up her pulmonary services. Patient recovered nicely and pulmonary services signed off 11/28/2017. Renal services follow-up for CKD stage III with elevated creatinine. Renal ultrasound unremarkable left kidney. Latest creatinine 3.33 with expected slow recovery. Physical therapy evaluation completed with recommendations of physical medicine rehabilitation consult.  Review of Systems  Constitutional: Negative for chills and fever.  HENT: Negative for hearing loss.   Eyes: Negative for blurred vision and double vision.  Respiratory: Negative for cough.        Shortness of breath with exertion  Cardiovascular: Positive for leg swelling. Negative for chest pain and palpitations.  Gastrointestinal: Positive for constipation. Negative for nausea.  Genitourinary: Negative for dysuria, flank pain and hematuria.  Musculoskeletal: Positive for joint pain and myalgias.  Skin: Negative for rash.  Neurological: Negative for seizures.  Psychiatric/Behavioral: Positive  for memory loss.  All other systems reviewed and are negative.  Past Medical History:  Diagnosis Date  . Abdominal aortic aneurysm ([place]) 09/2008   s/o endovascular repair and angiogram 10/04/08   . Anemia    EGD Cscope 08-2012  . Arthritis    "hips" (11/23/2017)  . Bleeding stomach ulcer 1990s  . Bleeding ulcer 1997  . CKD (chronic kidney disease), stage IV ([facility])    Stable, Dr. Dimas Aguas  . COPD (chronic obstructive pulmonary disease) ([place])    "mild" (11/23/2017)  . Dementia    "on very small dose of Aricept" (11/23/2017)  . H/O hyperkalemia    Stable  . Hypertension   . Leukopenia 2/11   thought to be from doxy  . Pneumonia 2018 X 2  . Renal cell carcinoma    clear cell type dx 10/09, s/p excision 10-24-08  . Renal insufficiency   . Solitary left kidney    W/ chronic tubule-interstitial damage, Dr. Dimas Aguas  . Spinal stenosis    causing LE weakness-persistant, being worked up by Hydrographic surveyor and neurologist   Past Surgical History:  Procedure Laterality Date  . ABDOMINAL AORTIC ANEURYSM REPAIR  2009   EVAR  . CATARACT EXTRACTION W/ INTRAOCULAR LENS  IMPLANT, BILATERAL Bilateral   . HEMORRHOID SURGERY  1990  . IR ANGIOGRAM PELVIS SELECTIVE OR SUPRASELECTIVE  11/24/2017  . IR ANGIOGRAM SELECTIVE EACH ADDITIONAL VESSEL  11/24/2017  . IR ANGIOGRAM SELECTIVE EACH ADDITIONAL VESSEL  11/24/2017  . IR ANGIOGRAM SELECTIVE EACH ADDITIONAL VESSEL  11/24/2017  . IR ANGIOGRAM SELECTIVE EACH ADDITIONAL VESSEL  11/24/2017  . IR ANGIOGRAM VISCERAL SELECTIVE  11/24/2017  . IR ANGIOGRAM VISCERAL SELECTIVE  11/24/2017  . IR RADIOLOGIST EVAL & MGMT  09/07/2017  . IR US GUIDE VASC ACCESS RIGHT  11/24/2017  . [place] (lip), 2004  . NEPHRECTOMY  2009   Open nephrectomy for cancer   Family History  Problem Relation Age of Onset  . Diabetes Father        ?  . Stroke Father   . Hypertension Father   . Colon cancer Neg Hx   . Prostate cancer Neg Hx    Social  History:  reports that he quit smoking about 9 years ago. He has a 90.00 pack-year smoking history. he has never used smokeless tobacco. He reports that he does not drink alcohol or use drugs. Allergies:  Allergies  Allergen Reactions  . Iodinated Diagnostic Agents Other (See Comments)    Only one kidney   . Ioxaglate Other (See Comments)    Only one kidney   . Baclofen Other (See Comments)    Unable to walk per Pt's family    Medications Prior to Admission  Medication Sig Dispense Refill  . Carbidopa-Levodopa ER (SINEMET CR) 25-100 MG tablet controlled release Take 0.5 tablets by mouth 2 (two) times daily.    . Cholecalciferol (VITAMIN D) 2000 units CAPS Take 2,000 Units by mouth daily.    Marland Kitchen docusate sodium (COLACE) 100 MG capsule Take 100 mg by mouth daily.    Marland Kitchen donepezil (ARICEPT) 5 MG tablet Take 5 mg by mouth at bedtime.    . FeFum-FePo-FA-B Cmp-C-Zn-Mn-Cu (SE-TAN PLUS) 162-115.2-1 MG CAPS Take 1 capsule by mouth daily. Take 1 Tab  daily    . hydrALAZINE (APRESOLINE) 25 MG tablet Take 25 mg by mouth 3 (three) times daily.    . hydrochlorothiazide (HYDRODIURIL) 12.5 MG tablet Take 12.5 mg daily by mouth.    . labetalol (NORMODYNE) 100 MG tablet Take 1 tablet (100 mg total) by mouth 2 (two) times daily. 60 tablet 6  . prednisoLONE acetate (PRED FORTE) 1 % ophthalmic suspension Place 1 drop into both eyes 2 (two) times daily.  0  . pregabalin (LYRICA) 50 MG capsule Take 50 mg by mouth at bedtime.     . sodium polystyrene (KAYEXALATE) 15 GM/60ML suspension Take 15 g by mouth 2 (two) times a week. Take 15 Grams on Monday and Friday      Home: [place] expects to be discharged to:: Private residence Living Arrangements: Alone Available Help at Discharge: Family, Available 24 hours/day Type of Home: House Home Access: Stairs to enter [employer] of Steps: 1 Home Layout: One level Bathroom Shower/Tub: Multimedia programmer: [place]: Environmental consultant - 2 wheels, [employer] - single point, Bedside commode, Shower seat  Functional History: Prior Function Level of Independence: Independent Comments: pt's daughter drives him wherever he needs to go. Pt was receiving OP therapy services 2x/week Functional Status:  Mobility: Bed Mobility Overal bed mobility: Needs Assistance Bed Mobility: Supine to Sit Supine to sit: Min assist General bed mobility comments: pt up in recliner with PT upon arrival Transfers Overall transfer level: Needs assistance Equipment used: Rolling walker (2 wheeled) Transfers: Sit to/from Stand, [employer] Transfers Sit to Stand: Mod assist, +2 safety/equipment Stand pivot transfers: Max assist, +2 physical assistance, +2 safety/equipment General transfer comment: Moderate physical assist to power up to stand x3, initial posterior bias noted, Multi modal cues for posture and positioning. With cues patient able to correct to upright and bring weight forward to improve positioning with RW Ambulation/Gait Ambulation/Gait assistance: Mod assist, +2 physical assistance Ambulation Distance (Feet): 40 Feet(40 x3) Assistive device: Rolling walker (2 wheeled) Gait Pattern/deviations: Step-to pattern, Trunk flexed, Narrow base of support, Festinating, Decreased stride length  General Gait Details: Patient with very festinating gait and bilateral LEs buckling initially, VCs for increased gait speed and stide, cued to over exagerate steps with marked improvement in patients ability Gait velocity: decreased    ADL: ADL Overall ADL's : Needs assistance/impaired Eating/Feeding: Set up, Sitting Grooming: Set up, Supervision/safety, Sitting, Wash/dry face, Wash/dry hands Upper Body Bathing: Min guard, Sitting Lower Body Bathing: Minimal assistance, Sitting/lateral leans Upper Body Dressing : Min guard, Sitting Lower Body Dressing: Moderate assistance, +2 for physical assistance, +2 for safety/equipment, Sit to/from  stand Lower Body Dressing Details (indicate cue type and reason): Pt able to reach and adjust socks sitting in recliner  Toilet Transfer: +2 for safety/equipment, +2 for physical assistance, RW, Moderate assistance, Ambulation, Stand-pivot Toilet Transfer Details (indicate cue type and reason): simulated in transfer to [place] and Hygiene: +2 for physical assistance, +2 for safety/equipment, Sit to/from stand, Moderate assistance Functional mobility during ADLs: +2 for physical assistance, +2 for safety/equipment, Rolling walker, Moderate assistance General ADL Comments: Pt with B knee buckling during transfer and ambulation requiring increased assist  Cognition: Cognition Overall Cognitive Status: History of cognitive impairments - at baseline Orientation Level: Oriented to person, Oriented to place, Oriented to situation Cognition Arousal/Alertness: Awake/alert Behavior During Therapy: WFL for tasks assessed/performed Overall Cognitive Status: History of cognitive impairments - at baseline General Comments: pt with dementia at baseline  Blood pressure (!) 127/55, pulse 68, temperature 98.7 F (37.1 C), temperature source Oral, resp. rate 18, height 5\' 8"  (1.727 m), weight 70.8 kg (156 lb 1.4 oz), SpO2 96 %. Physical Exam  Vitals reviewed. Constitutional: He appears well-developed.  Frail  HENT:  Head: Normocephalic and atraumatic.  Eyes: EOM are normal. Right eye exhibits no discharge. Left eye exhibits no discharge.  Neck: Normal range of motion. Neck supple. No thyromegaly present.  Cardiovascular:  Irregularly irregular  Respiratory:  Limited inspiratory effort but clear to auscultation +[place]  GI: Soft. Bowel sounds are normal. He exhibits no distension.  Musculoskeletal: He exhibits edema (LE). He exhibits no tenderness.  Neurological: He is alert.  Makes eye contact with examiner.  Daughter injects and answers/cues patient.  A&Ox3 with  cues  Limited medical historian. Motor: Grossly 4/5 throughout Sensation intact to light touch HOH  Skin: Skin is warm and dry.  Psychiatric: His affect is blunt. His speech is delayed. He is not slowed.    Results for orders placed or performed during the hospital encounter of 11/23/17 (from the past 24 hour(s))  Basic metabolic panel     Status: Abnormal   Collection Time: 11/28/17  5:43 AM  Result Value Ref Range   Sodium 135 135 - 145 mmol/L   Potassium 4.3 3.5 - 5.1 mmol/L   Chloride 101 101 - 111 mmol/L   CO2 25 22 - 32 mmol/L   Glucose, Bld 108 (H) 65 - 99 mg/dL   BUN 60 (H) 6 - 20 mg/dL   Creatinine, Ser 3.33 (H) 0.61 - 1.24 mg/dL   Calcium 8.1 (L) 8.9 - 10.3 mg/dL   GFR calc non Af Amer 15 (L) >60 mL/min   GFR calc Af Amer 18 (L) >60 mL/min   Anion gap 9 5 - 15  TSH     Status: None   Collection Time: 11/28/17  6:07 AM  Result Value Ref Range   TSH 2.836 0.350 - 4.500 uIU/mL   US Renal  Result Date: 11/27/2017 CLINICAL DATA:  83 year old male with acute kidney injury. History of  right nephrectomy for renal cell carcinoma. EXAM: RENAL / URINARY TRACT ULTRASOUND COMPLETE COMPARISON:  11/25/1999 FINDINGS: Right Kidney: Not visualized compatible with nephrectomy. Left Kidney: Length: 11.1 cm. Renal echogenicity is normal. Multiple renal cysts are identified, the largest measuring 3.4 cm in the mid-upper left kidney. No hydronephrosis or solid mass identified. Bladder: Appears normal for degree of bladder distention. IMPRESSION: 1. Unremarkable left kidney except for renal cysts. 2. Status post right nephrectomy 3. Unremarkable bladder Electronically Signed   By: Margarette Canada M.D.   On: 11/27/2017 16:57   Dg Chest Port 1 View  Result Date: 11/27/2017 CLINICAL DATA:  Respiratory failure EXAM: PORTABLE CHEST 1 VIEW COMPARISON:  11/26/2017 FINDINGS: Cardiomegaly with vascular congestion, small effusions and bibasilar atelectasis. Probable mild perihilar/interstitial edema. No real  change since prior study. IMPRESSION: No interval change. Electronically Signed   By: Rolm Baptise M.D.   On: 11/27/2017 08:36    Assessment/Plan: Diagnosis: Debility Labs independently reviewed.  Records reviewed and summated above.  1. Does the need for close, 24 hr/day medical supervision in concert with the patient's rehab needs make it unreasonable for this patient to be served in a less intensive setting? Yes  2. Co-Morbidities requiring supervision/potential complications: CKD stage III (avoid nephrotoxic meds), dementia (cont meds), parkinsonian syndrome (cont meds), AAA with prior endovascular repair and angiogram 2009, renal cell carcinoma solitary left kidney (avoid nephrotoxic meds), HTN (monitor and provide prns in accordance with increased physical exertion and pain), COPD (monitor RR and O2 sats with increased mobility), chronic anemia with pancytopenia (monitor labs, transfuse if necessary to ensure appropriate perfusion for increased activity tolerance), AKI (see CKD), Thrombocytopenia (< 60,000/mm3 no resistive exercise) 3. Due to bladder management, safety, skin/wound care, disease management and patient education, does the patient require 24 hr/day rehab nursing? Yes 4. Does the patient require coordinated care of a physician, rehab nurse, PT (1-2 hrs/day, 5 days/week) and OT (1-2 hrs/day, 5 days/week) to address physical and functional deficits in the context of the above medical diagnosis(es)? Yes Addressing deficits in the following areas: balance, endurance, locomotion, strength, transferring, bathing, dressing, toileting, cognition and psychosocial support 5. Can the patient actively participate in an intensive therapy program of at least 3 hrs of therapy per day at least 5 days per week? Potentially 6. The potential for patient to make measurable gains while on inpatient rehab is excellent 7. Anticipated functional outcomes upon discharge from inpatient rehab are supervision  and min assist  with PT, supervision and min assist with OT, supervision with SLP. 8. Estimated rehab length of stay to reach the above functional goals is: 16-20 days. 9. Anticipated D/C setting: Home 10. Anticipated post D/C treatments: HH therapy and Home excercise program 11. Overall Rehab/Functional Prognosis: good  RECOMMENDATIONS: This patient's condition is appropriate for continued rehabilitative care in the following setting: Likely CIR soon if patient to demonstrate ability to tolerate 3 hours of therapy/day. Patient has agreed to participate in recommended program. Yes Note that insurance prior authorization may be required for reimbursement for recommended care.  Comment: Rehab Admissions Coordinator to follow up.  Delice Lesch, MD, ABPMR Lavon Paganini Angiulli, PA-C 11/28/2017

## 2017-11-28 NOTE — Progress Notes (Signed)
PCCM Progress Note  Subjective: Breathing better.  Not bringing up as much sputum.  Vital signs: BP (!) 127/55 (BP Location: Right Arm)   Pulse 68   Temp 98.7 F (37.1 C) (Oral)   Resp 18   Ht 5\' 8"  (1.727 m)   Wt 156 lb 1.4 oz (70.8 kg)   SpO2 96%   BMI 23.73 kg/m   Intake/output: I/O last 3 completed shifts: In: 660 [P.O.:660] Out: 1650 [Urine:1650]  Physical exam:  General - frail, sitting in chair Eyes - pupils reactive ENT - no sinus tenderness, no oral exudate, no LAN Cardiac - regular, no murmur Chest - no wheeze, rales Abd - soft, non tender Ext - no edema Skin - no rashes Neuro - normal strength Psych - normal mood   CMP Latest Ref Rng & Units 11/28/2017 11/27/2017 11/26/2017  Glucose 65 - 99 mg/dL 108(H) 114(H) 86  BUN 6 - 20 mg/dL 60(H) 52(H) 42(H)  Creatinine 0.61 - 1.24 mg/dL 3.33(H) 3.25(H) 2.81(H)  Sodium 135 - 145 mmol/L 135 137 137  Potassium 3.5 - 5.1 mmol/L 4.3 3.9 3.7  Chloride 101 - 111 mmol/L 101 102 103  CO2 22 - 32 mmol/L 25 28 24   Calcium 8.9 - 10.3 mg/dL 8.1(L) 8.4(L) 8.2(L)  Total Protein 6.5 - 8.1 g/dL - - -  Total Bilirubin 0.3 - 1.2 mg/dL - - -  Alkaline Phos 38 - 126 U/L - - -  AST 15 - 41 U/L - - -  ALT 17 - 63 U/L - - -    CBC Latest Ref Rng & Units 11/26/2017 11/25/2017 11/24/2017  WBC 4.0 - 10.5 K/uL 3.6(L) 4.7 2.4(L)  Hemoglobin 13.0 - 17.0 g/dL 8.7(L) 8.8(L) 8.7(L)  Hematocrit 39.0 - 52.0 % 27.5(L) 28.0(L) 27.5(L)  Platelets 150 - 400 K/uL 74(L) 82(L) 80(L)    US Renal  Result Date: 11/27/2017 CLINICAL DATA:  83 year old male with acute kidney injury. History of right nephrectomy for renal cell carcinoma. EXAM: RENAL / URINARY TRACT ULTRASOUND COMPLETE COMPARISON:  11/25/1999 FINDINGS: Right Kidney: Not visualized compatible with nephrectomy. Left Kidney: Length: 11.1 cm. Renal echogenicity is normal. Multiple renal cysts are identified, the largest measuring 3.4 cm in the mid-upper left kidney. No hydronephrosis or solid  mass identified. Bladder: Appears normal for degree of bladder distention. IMPRESSION: 1. Unremarkable left kidney except for renal cysts. 2. Status post right nephrectomy 3. Unremarkable bladder Electronically Signed   By: Margarette Canada M.D.   On: 11/27/2017 16:57   Dg Chest Port 1 View  Result Date: 11/27/2017 CLINICAL DATA:  Respiratory failure EXAM: PORTABLE CHEST 1 VIEW COMPARISON:  11/26/2017 FINDINGS: Cardiomegaly with vascular congestion, small effusions and bibasilar atelectasis. Probable mild perihilar/interstitial edema. No real change since prior study. IMPRESSION: No interval change. Electronically Signed   By: Rolm Baptise M.D.   On: 11/27/2017 08:36    Assessment/plan:  Acute hypoxic respiratory failure from atelectasis and pulmonary edema. - oxygen to keep SpO2 > 92% - pulmonary hygiene - mobilize as able - negative fluid balance as tolerated - f/u CXR as needed  Updated pt's family at bedside  PCCM will sign off.  Please call if additional help needed.  Chesley Mires, MD [facility] Pulmonary/Critical Care 11/28/2017, 10:41 AM Pager:  [phone] After 3pm call: [phone]

## 2017-11-28 NOTE — Consult Note (Signed)
Cardiology Consultation:   Patient ID: [name]; [id number]; Aug 05, 1927   Admit date: 11/23/2017 Date of Consult: 11/28/2017  Primary Care Provider: Colon Branch, MD Primary Cardiologist: NA Primary Electrophysiologist:  NA   Patient Profile:   [name] is a 83 y.o. male with a hx of AAA with prior endovascular repair and angiogram 10/04/2008, CKD Stage IV, renal cell CA with solitary left kidney s/p nephrectomy 2009 , HTN, COPD, chronic anemia/pancytopenia who is being seen today for the evaluation of new onset atrial fibrillation at the request of Dr. [name].  History of Present Illness:   [name] has a complicated hospital course. He was initially admitted 11/23/17 as a direct admission from his nephrologist office for possible AAA endoleak evaluation and repair found on routine office examination. He is regularly followed by Dr. Trula Slade, who has been monitoring with non-contrast CT scans due to his underlying kidney function found initially in 09/2008.   On 11/24/17 he was taken to IR for a visceral angiogram with no evidence of endoleak. Post procedure, the patient was noted to be hypoxic, however not in distress. He was hypertensive with O2 saturations in the 80's. CXR initially was   concerning for RLL consolidation vs. atelectasis. On 11/25/17, a repeat CXR was performed which demonstrated almost complete white out of the left lung with mediastinal shift. PCCM was consulted and given mucomyst , lasix, and placed on a vibra vest with chest PT.   Respiratory status has now improved, however on 11/27/17 he was noted to be in AF with slow ventricular response without associated symptoms. This is new for him. He denies CP, SOB, palpitations, or dizziness.   Daughter is at bedside and states that the patient has completely decompensated since the sequence of his hospital events. Prior to admission he was fully functional in self care and moves around on his own with little  to no assistance. Although pleasant, the family is very anxious about his hospital course. Much time was spent at the bedside listening to their concerns and reassuring them that he is getting the best care possible.    Past Medical History:  Diagnosis Date  . Abdominal aortic aneurysm ([place]) 09/2008   s/o endovascular repair and angiogram 10/04/08   . Anemia    EGD Cscope 08-2012  . Arthritis    "hips" (11/23/2017)  . Bleeding stomach ulcer 1990s  . Bleeding ulcer 1997  . CKD (chronic kidney disease), stage IV (HCC)    Stable, Dr. Dimas Aguas  . COPD (chronic obstructive pulmonary disease) ([place])    "mild" (11/23/2017)  . Dementia    "on very small dose of Aricept" (11/23/2017)  . H/O hyperkalemia    Stable  . Hypertension   . Leukopenia 2/11   thought to be from doxy  . Pneumonia 2018 X 2  . Renal cell carcinoma    clear cell type dx 10/09, s/p excision 10-24-08  . Renal insufficiency   . Solitary left kidney    W/ chronic tubule-interstitial damage, Dr. Dimas Aguas  . Spinal stenosis    causing LE weakness-persistant, being worked up by Hydrographic surveyor and neurologist    Past Surgical History:  Procedure Laterality Date  . ABDOMINAL AORTIC ANEURYSM REPAIR  2009   EVAR  . CATARACT EXTRACTION W/ INTRAOCULAR LENS  IMPLANT, BILATERAL Bilateral   . HEMORRHOID SURGERY  1990  . IR ANGIOGRAM PELVIS SELECTIVE OR SUPRASELECTIVE  11/24/2017  . IR ANGIOGRAM SELECTIVE EACH ADDITIONAL VESSEL  11/24/2017  .  IR ANGIOGRAM SELECTIVE EACH ADDITIONAL VESSEL  11/24/2017  . IR ANGIOGRAM SELECTIVE EACH ADDITIONAL VESSEL  11/24/2017  . IR ANGIOGRAM SELECTIVE EACH ADDITIONAL VESSEL  11/24/2017  . IR ANGIOGRAM VISCERAL SELECTIVE  11/24/2017  . IR ANGIOGRAM VISCERAL SELECTIVE  11/24/2017  . IR RADIOLOGIST EVAL & MGMT  09/07/2017  . IR US GUIDE VASC ACCESS RIGHT  11/24/2017  . [place] (lip), 2004  . NEPHRECTOMY  2009   Open nephrectomy for cancer     Prior to Admission medications    Medication Sig Start Date End Date Taking? Authorizing Provider  Carbidopa-Levodopa ER (SINEMET CR) 25-100 MG tablet controlled release Take 0.5 tablets by mouth 2 (two) times daily.   Yes [provider]  Cholecalciferol (VITAMIN D) 2000 units CAPS Take 2,000 Units by mouth daily.   Yes [provider]  docusate sodium (COLACE) 100 MG capsule Take 100 mg by mouth daily.   Yes [provider]  donepezil (ARICEPT) 5 MG tablet Take 5 mg by mouth at bedtime.   Yes [provider]  FeFum-FePo-FA-B Cmp-C-Zn-Mn-Cu (SE-TAN PLUS) 162-115.2-1 MG CAPS Take 1 capsule by mouth daily. Take 1 Tab  daily   Yes [provider]  hydrALAZINE (APRESOLINE) 25 MG tablet Take 25 mg by mouth 3 (three) times daily.   Yes [provider]  hydrochlorothiazide (HYDRODIURIL) 12.5 MG tablet Take 12.5 mg daily by mouth.   Yes [provider]  labetalol (NORMODYNE) 100 MG tablet Take 1 tablet (100 mg total) by mouth 2 (two) times daily. 07/14/15  Yes Paz, Alda Berthold, MD  prednisoLONE acetate (PRED FORTE) 1 % ophthalmic suspension Place 1 drop into both eyes 2 (two) times daily. 11/16/17  Yes [provider]  pregabalin (LYRICA) 50 MG capsule Take 50 mg by mouth at bedtime.    Yes [provider]  sodium polystyrene (KAYEXALATE) 15 GM/60ML suspension Take 15 g by mouth 2 (two) times a week. Take 15 Grams on Monday and Friday   Yes [provider]    Inpatient Medications: Scheduled Meds: . Carbidopa-Levodopa ER  0.5 tablet Oral BID  . darbepoetin (ARANESP) injection - NON-DIALYSIS  100 mcg Subcutaneous Once  . docusate sodium  100 mg Oral Daily  . donepezil  5 mg Oral QHS  . guaiFENesin  600 mg Oral BID  . mouth rinse  15 mL Mouth Rinse BID  . prednisoLONE acetate  1 drop Both Eyes BID   Continuous Infusions:  PRN Meds: albuterol, ondansetron (ZOFRAN) IV, oxyCODONE **OR** oxyCODONE  Allergies:    Allergies  Allergen Reactions  .  Iodinated Diagnostic Agents Other (See Comments)    Only one kidney   . Ioxaglate Other (See Comments)    Only one kidney   . Baclofen Other (See Comments)    Unable to walk per Pt's family     Social History:   Social History   Socioeconomic History  . Marital status: Widowed    Spouse name: Not on file  . Number of children: 3  . Years of education: Not on file  . Highest education level: Not on file  Social Needs  . Financial resource strain: Not on file  . Food insecurity - worry: Not on file  . Food insecurity - inability: Not on file  . Transportation needs - medical: Not on file  . Transportation needs - non-medical: Not on file  Occupational History  . Occupation: retired    Fish farm manager: RETIRED  Tobacco  Use  . Smoking status: Former Smoker    Packs/day: 1.50    Years: 60.00    Pack years: 90.00    Last attempt to quit: 12/28/2007    Years since quitting: 9.9  . Smokeless tobacco: Never Used  Substance and Sexual Activity  . Alcohol use: No    Alcohol/week: 0.0 oz  . Drug use: No  . Sexual activity: Not on file  Other Topics Concern  . Not on file  Social History Narrative   Lives by himself    Lost wife to dementia 10-2015   Totally independent on ADL , no driving   Often came to office w/ his daughter [name] [phone]), his other daughter is  [name] , son is Wilfrid                    Family History:   Family History  Problem Relation Age of Onset  . Diabetes Father        ?  . Stroke Father   . Hypertension Father   . Colon cancer Neg Hx   . Prostate cancer Neg Hx    Family Status:  Family Status  Relation Name Status  . Father  Deceased at age 56  . Mother  Deceased at age 57  . Daughter  Alive  . Neg Hx  (Not Specified)    ROS:  Please see the history of present illness.  All other ROS reviewed and negative.     Physical Exam/Data:   Vitals:   11/27/17 2223 11/27/17 2245 11/28/17 0631 11/28/17 0747  BP: (!) 108/53  135/62 (!) 127/55    Pulse: 69  73 68  Resp: 17  18 18   Temp: 98.7 F (37.1 C)  98.4 F (36.9 C) 98.7 F (37.1 C)  TempSrc: Tympanic  Axillary Oral  SpO2: (!) 87% 91% 98% 96%  Weight: 156 lb 1.4 oz (70.8 kg)     Height:        Intake/Output Summary (Last 24 hours) at 11/28/2017 1459 Last data filed at 11/28/2017 1433 Gross per 24 hour  Intake 240 ml  Output 975 ml  Net -735 ml   Filed Weights   11/26/17 2200 11/27/17 0500 11/27/17 2223  Weight: 155 lb 9.6 oz (70.6 kg) 155 lb 10.3 oz (70.6 kg) 156 lb 1.4 oz (70.8 kg)   Body mass index is 23.73 kg/m.   General: Frail, elderly, NAD Skin: Warm, dry, intact  Head: Normocephalic, atraumatic, clear, moist mucus membranes. Neck: Negative for carotid bruits. No JVD Lungs: Bilateral wheezes and rhonchi. Breathing is unlabored.  Cardiovascular: Irregular with S1 S2. No murmurs, rubs, or gallops. Abdomen: Soft, non-tender, non-distended with normoactive bowel sounds. No obvious abdominal masses. MSK: Strength and tone appear normal for age. 5/5 in all extremities Extremities: Mild LE edema. No clubbing or cyanosis. DP/PT pulses 2+ bilaterally Neuro: Alert and oriented. No focal deficits. No facial asymmetry. MAE spontaneously. Psych: Responds to questions appropriately with normal affect.     EKG:  The EKG was personally reviewed and demonstrates:  11/27/17- Atrial fibrillation with slow ventricular response- 56bpm. Wide QRS complexes  Telemetry:  Telemetry was personally reviewed and demonstrates: Atrial fibrillation   Relevant CV Studies:  ECHO: Pending as of 11/28/17  Transthoracic Echocardiography 03/05/2015: Study Conclusions  - Left ventricle: The cavity size was normal. Wall thickness was increased in a pattern of moderate LVH. Systolic function was normal. The estimated ejection fraction was in the range of 55% to 60%.  Wall motion was normal; there were no regional wall motion abnormalities. Features are consistent with a  pseudonormal left ventricular filling pattern, with concomitant abnormal relaxation and increased filling pressure (grade 2 diastolic dysfunction). - Aortic valve: There was mild regurgitation. - Mitral valve: There was mild regurgitation. - Left atrium: The atrium was mildly dilated. - Right atrium: The atrium was mildly dilated.  Transthoracic echocardiography. M-mode, complete 2D, spectral Doppler, and color Doppler. Birthdate: Patient birthdate: 1927-10-26. Age: Patient is 83 yr old. Sex: Gender: male. BMI: 26.3 kg/m^2. Blood pressure:   127/57 Patient status: Inpatient. Study date: Study date: 03/05/2015. Study time: 12:46 PM. Location: Echo laboratory. Left ventricle: The cavity size was normal. Wall thickness was increased in a pattern of moderate LVH. Systolic function was normal. The estimated ejection fraction was in the range of 55% to 60%. Wall motion was normal; there were no regional wall motion abnormalities. Features are consistent with a pseudonormal left ventricular filling pattern, with concomitant abnormal relaxation and increased filling pressure (grade 2 diastolic dysfunction).  ------------------------------------------------------------------- Aortic valve:  Structurally normal valve.  Cusp separation was normal. Doppler: Transvalvular velocity was within the normal range. There was no stenosis. There was mild regurgitation.  ------------------------------------------------------------------- Aorta: Aortic root: The aortic root was normal in size. Ascending aorta: The ascending aorta was normal in size.  ------------------------------------------------------------------- Mitral valve:  The valve appears to be grossly normal.  Doppler: There was mild regurgitation.  Peak gradient (D): 3 mm Hg.  ------------------------------------------------------------------- Left atrium: The atrium was mildly  dilated.  ------------------------------------------------------------------- Right ventricle: The cavity size was normal. Systolic function was normal.  ------------------------------------------------------------------- Pulmonic valve:  The valve appears to be grossly normal. Doppler: There was no significant regurgitation.  ------------------------------------------------------------------- Tricuspid valve:  Structurally normal valve.  Leaflet separation was normal. Doppler: Transvalvular velocity was within the normal range. There was mild regurgitation.  ------------------------------------------------------------------- Right atrium: The atrium was mildly dilated.  ------------------------------------------------------------------- Pericardium: There was no pericardial effusion.   CATH: NA  Laboratory Data:  Chemistry Recent Labs  Lab 11/26/17 0523 11/27/17 0410 11/28/17 0543  NA 137 137 135  K 3.7 3.9 4.3  CL 103 102 101  CO2 24 28 25   GLUCOSE 86 114* 108*  BUN 42* 52* 60*  CREATININE 2.81* 3.25* 3.33*  CALCIUM 8.2* 8.4* 8.1*  GFRNONAA 19* 16* 15*  GFRAA 22* 18* 18*  ANIONGAP 10 7 9     Total Protein  Date Value Ref Range Status  11/23/2017 5.7 (L) 6.5 - 8.1 g/dL Final   Albumin  Date Value Ref Range Status  11/23/2017 3.5 3.5 - 5.0 g/dL Final   AST  Date Value Ref Range Status  11/23/2017 19 15 - 41 U/L Final   ALT  Date Value Ref Range Status  11/23/2017 7 (L) 17 - 63 U/L Final   Alkaline Phosphatase  Date Value Ref Range Status  11/23/2017 46 38 - 126 U/L Final   Total Bilirubin  Date Value Ref Range Status  11/23/2017 0.6 0.3 - 1.2 mg/dL Final   Hematology Recent Labs  Lab 11/24/17 0623 11/25/17 0501 11/26/17 0523  WBC 2.4* 4.7 3.6*  RBC 2.81* 2.89* 2.83*  HGB 8.7* 8.8* 8.7*  HCT 27.5* 28.0* 27.5*  MCV 97.9 96.9 97.2  MCH 31.0 30.4 30.7  MCHC 31.6 31.4 31.6  RDW 14.8 14.8 14.5  PLT 80* 82* 74*   Cardiac EnzymesNo  results for input(s): TROPONINI in the last 168 hours. No results for input(s): TROPIPOC in the last 168 hours.  BNPNo results for input(s): BNP, PROBNP in the last 168 hours.  DDimer No results for input(s): DDIMER in the last 168 hours. TSH:  Lab Results  Component Value Date   TSH 2.836 11/28/2017   Lipids: Lab Results  Component Value Date   CHOL 188 05/12/2017   HDL 50.60 05/12/2017   LDLCALC 122 (H) 05/12/2017   LDLDIRECT 134.4 10/19/2011   TRIG 78.0 05/12/2017   CHOLHDL 4 05/12/2017   HgbA1c: Lab Results  Component Value Date   HGBA1C 5.1 06/09/2015    Radiology/Studies:  Dg Chest 2 View  Result Date: 11/25/2017 CLINICAL DATA:  Cough, shortness of breath. EXAM: CHEST  2 VIEW COMPARISON:  Radiograph of November 24, 2017. FINDINGS: Stable cardiomegaly. Atherosclerosis of thoracic aorta is noted. Significantly increased opacity of the left lung is noted most consistent with atelectasis and associated pleural effusion. Significantly increased right to left midline shift is noted most consistent with volume loss. Hyperexpansion of the right lung is noted with mild right basilar atelectasis. No pneumothorax is noted. Bony thorax is unremarkable. IMPRESSION: Significant right to left mediastinal shift is noted most likely due to atelectasis and volume loss in the left hemithorax, with possible associated pleural effusion. Mild right basilar subsegmental atelectasis is noted. Electronically Signed   By: Marijo Conception, M.D.   On: 11/25/2017 10:21   US Renal  Result Date: 11/27/2017 CLINICAL DATA:  83 year old male with acute kidney injury. History of right nephrectomy for renal cell carcinoma. EXAM: RENAL / URINARY TRACT ULTRASOUND COMPLETE COMPARISON:  11/25/1999 FINDINGS: Right Kidney: Not visualized compatible with nephrectomy. Left Kidney: Length: 11.1 cm. Renal echogenicity is normal. Multiple renal cysts are identified, the largest measuring 3.4 cm in the mid-upper left kidney.  No hydronephrosis or solid mass identified. Bladder: Appears normal for degree of bladder distention. IMPRESSION: 1. Unremarkable left kidney except for renal cysts. 2. Status post right nephrectomy 3. Unremarkable bladder Electronically Signed   By: Margarette Canada M.D.   On: 11/27/2017 16:57   Dg Chest Port 1 View  Result Date: 11/27/2017 CLINICAL DATA:  Respiratory failure EXAM: PORTABLE CHEST 1 VIEW COMPARISON:  11/26/2017 FINDINGS: Cardiomegaly with vascular congestion, small effusions and bibasilar atelectasis. Probable mild perihilar/interstitial edema. No real change since prior study. IMPRESSION: No interval change. Electronically Signed   By: Rolm Baptise M.D.   On: 11/27/2017 08:36   Dg Chest Port 1 View  Result Date: 11/26/2017 CLINICAL DATA:  Atelectasis EXAM: PORTABLE CHEST 1 VIEW COMPARISON:  11/25/2017 FINDINGS: Cardiomegaly with vascular congestion. Bilateral perihilar and lower lobe opacities likely reflect edema with small effusions. Improved aeration on the left since prior study. IMPRESSION: Cardiomegaly with bilateral perihilar and lower lobe opacities, likely edema/ CHF. Suspect small effusions. Improved aeration on the left. Electronically Signed   By: Rolm Baptise M.D.   On: 11/26/2017 07:38    Assessment and Plan:   1. New on atrial fibrillation: -Echocardiogram pending 11/28/17. Called echo department>to be completed today -Given the patients admission date of 11/24/17 and documented telemetry of NSR, pt could potentially be DCCV candidate without the administration of anticoagulation therapy. -Otherwise rate control and anticoagulation  -TSH WNL -This patients CHA2DS2-VASc Score and unadjusted Ischemic Stroke Rate (% per year) is equal to 3.2 % stroke rate/year from a score of 3 Above score calculated as 1 point each if present [CHF, HTN, DM, Vascular=MI/PAD/Aortic Plaque, Age if 65-74, or Male], 2 points each if present [Age > 75, or Stroke/TIA/TE]  2. Acute on chronic  kidney disease stage IV: -Per nephrology   3. History of AAA with concern for leak: -Per IM -Negative visceral angiogram for endoleak   4. HTN: -Per IM -Stable, 127/55 -On admission, pt home medications of hydralazine, HCTZ, and labetalol were not ordered.   Active Problems:   Pressure injury of skin   Leaking abdominal aortic aneurysm (AAA) ([place])  For questions or updates, please contact [facility] Please consult [email or web address] for contact info under Cardiology/STEMI.   Signed, Kathyrn Drown, NP  11/28/2017 2:59 PM   Personally seen and examined. Agree with above.  83 year old male with AAA repair, endovascular, stage IV chronic kidney disease with acute on chronic kidney injury, solitary kidney status post nephrectomy, hypertension, COPD, chronic anemia with hospital course that featured no evidence of endovascular leak by interventional radiology, development of respiratory distress, kidney injury.  Now with development of atrial fibrillation.  Well rate controlled.  Elderly appearing in bed, no distress, shook my hand however [name] his daughter answered most of the questions.  Heart is irregularly irregular, no significant murmurs, no evidence of significant edema, moves all extremities.  Somewhat fatigued.  Telemetry reviewed demonstrating atrial fibrillation heart rate in the 70s-80s.  Personally reviewed.  Atrial fibrillation, paroxysmal -Certainly could have been exacerbated by his current medical illness. - Had lengthy discussion with his daughter about risks of potential stroke versus risks of bleeding.  I think that it is very possible that he may have been having bouts of atrial fibrillation at home without symptoms.  He does not currently feel his atrial fibrillation. -Based upon this discussion, we had originally decided to pursue Coumadin therapy but upon closer review of his past medical history including pancytopenia which he is seeing hematology, as well as  discussion with his primary hospitalist Dr. [name], I do think that anticoagulation in his current state could prove to be more risk than benefit from a bleeding perspective.  [name], his daughter understands.  I think that it would be reasonable however to have a further discussion with the hematologist as an outpatient to determine whether or not warfarin would be a reasonable long-term option for him.  They have an appointment on December 18.  He does have baseline anemia as well as baseline thrombocytopenia with platelet counts usually in the 70s-90s.  This may be partly autoimmune as well as renal disease.  He is at increased risk for bleeding and worsening anemia.  Unfortunately given his advanced age, cardiovascular disease, he is at high risk for potential stroke with his atrial fibrillation. - He is not a candidate for novel oral anticoagulant given his renal function. -He has not sustained any frequent falls.   Echocardiogram preliminary results show severe LVH with normal ejection fraction, thickened aortic valve.  TSH normal. Spoke with Dr [name], appreciate his input.  Candee Furbish, MD

## 2017-11-28 NOTE — Progress Notes (Signed)
[facility] Progress Note  Subjective: creat up but only slightly. 1400 UOP again. Rhythm irreg and slow today, getting labetalol and hydralazine. No c/o, denies any SOB.  Daughter at bedside very concerned "this is not him"   Vitals:   11/27/17 2223 11/27/17 2245 11/28/17 0631 11/28/17 0747  BP: (!) 108/53  135/62 (!) 127/55  Pulse: 69  73 68  Resp: 17  18 18   Temp: 98.7 F (37.1 C)  98.4 F (36.9 C) 98.7 F (37.1 C)  TempSrc: Tympanic  Axillary Oral  SpO2: (!) 87% 91% 98% 96%  Weight: 70.8 kg (156 lb 1.4 oz)     Height:        Inpatient medications: . Carbidopa-Levodopa ER  0.5 tablet Oral BID  . docusate sodium  100 mg Oral Daily  . donepezil  5 mg Oral QHS  . guaiFENesin  600 mg Oral BID  . mouth rinse  15 mL Mouth Rinse BID  . prednisoLONE acetate  1 drop Both Eyes BID    albuterol, ondansetron (ZOFRAN) IV, oxyCODONE **OR** oxyCODONE  Exam: Gen elderly HOH male, no distress, in chair No jvd or bruits Chest minimal crackles R base, L clear RRR no MRG  Abd soft ntnd no mass or ascites +bs  GU normal male w condom cath in place defer MS no joint effusions or deformity Ext 1+ dependent edema Neuro is sleepy but easily awakens, NF, ox 2  No UA CXR 12/01 > Cardiomegaly with bilateral perihilar and lower lobe opacities, likely edema/ CHF. Suspect small effusions.  CXR 12/02 - improved CHF  Inpt meds: mucomyst, sinemet CR, colace, aricept, fentanyl, lasix IV, hydralazine 25 mg po tid, HTCZ 12.5 mg /d, labealol 100 bid, versed, lyrica, albuterol, versed,    Impression: 1. Acute kidney injury - due to contrast nephropathy most likely. Not improving but maybe rate of rise of creatinine better. Making urine.   renal US no hydro.  Contrast injury usually will recover in 3-7 days. Have d/w family, small but real chance of progressing to needing dialysis, but no need for dialysis at this point. DC'd lyrica for now as need to avoid sedating meds w/ potential  for uremia in next 48 hrs.  2. CKD baseline creat 2.1- 2.5, stage IV ckd.  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 Will make recovery slower 3. Vol / pulm edema/ dyspnea - improved, CXR better. Still has edema but w/ climbing creat , will hold further lasix.  Last dose 12/1 am.   4. Bradycardia - afib on EKG,   Have dc'd the low dose labetalol. improved 5. Anemia - mesenteric angiogram did not show any signs of endoleak, this was reason for admission. Will check iron stores and give ESA 6. HTN - low normal, dc'd labetalol for low HR.  DC hydralazine and see if higher BP's help renal situation.  7. Dementia/ Parkinson's- complicating issue but daughter says was living independantly       [place] A  11/28/2017, 11:30 AM   Recent Labs  Lab 11/26/17 0523 11/27/17 0410 11/28/17 0543  NA 137 137 135  K 3.7 3.9 4.3  CL 103 102 101  CO2 24 28 25   GLUCOSE 86 114* 108*  BUN 42* 52* 60*  CREATININE 2.81* 3.25* 3.33*  CALCIUM 8.2* 8.4* 8.1*   Recent Labs  Lab 11/23/17 1953  AST 19  ALT 7*  ALKPHOS 46  BILITOT 0.6  PROT 5.7*  ALBUMIN 3.5   Recent Labs  Lab  11/24/17 0623 11/25/17 0501 11/26/17 0523  WBC 2.4* 4.7 3.6*  HGB 8.7* 8.8* 8.7*  HCT 27.5* 28.0* 27.5*  MCV 97.9 96.9 97.2  PLT 80* 82* 74*   Iron/TIBC/Ferritin/ %Sat    Component Value Date/Time   IRON 66 06/09/2009 0909   FERRITIN 90.2 06/09/2009 0909

## 2017-11-28 NOTE — Progress Notes (Signed)
Patient ID: JOURDYN FERRIN, male   DOB: 05-Apr-1927, 83 y.o.   MRN: [id number]  Dr Laurence Ferrari note 11/24/17: INDICATION: 83 year old male with an enlarging infrarenal abdominal aortic aneurysm despite prior endovascular aortic repair. 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 Patient has significant chronic kidney disease and was admitted yesterday for pre-procedure hydration. He presents now for multi selective angiography and attempted endoleak repair.  IMPRESSION: 1. 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 No evidence of type 2 endoleak arising from the IMA, or the right-sided lumbar arteries.  Pt admitted to [facility] after procedure for medical management Day 4 of hospital stay Worsening creatinine Today 3.3  Followed by Renal team and [facility]  Pt daughter in room Upset over pts condition and level of health at this point  I will have Dr Laurence Ferrari see pt and daughter in room today She feels he may be able to answer some questions she has regarding procedure and plan.

## 2017-11-28 NOTE — Progress Notes (Signed)
I met with pt and his daughter at bedside to discuss goals and expectations of an inpt rehab admission. They are in agreement. I will follow up tomorrow to clarify bed availability and am hopeful for admit . [phone]

## 2017-11-29 ENCOUNTER — Encounter (HOSPITAL_COMMUNITY): Payer: Self-pay

## 2017-11-29 ENCOUNTER — Other Ambulatory Visit: Payer: Self-pay

## 2017-11-29 ENCOUNTER — Inpatient Hospital Stay (HOSPITAL_COMMUNITY)
Admission: RE | Admit: 2017-11-29 | Discharge: 2017-12-07 | DRG: 945 | Disposition: A | Discharge status: Home / Self Care | Payer: Medicare Other | Source: Intra-hospital | Attending: Physical Medicine & Rehabilitation | Admitting: Physical Medicine & Rehabilitation

## 2017-11-29 DIAGNOSIS — Z8249 Family history of ischemic heart disease and other diseases of the circulatory system: Secondary | ICD-10-CM | POA: Diagnosis not present

## 2017-11-29 DIAGNOSIS — H9190 Unspecified hearing loss, unspecified ear: Secondary | ICD-10-CM | POA: Diagnosis present

## 2017-11-29 DIAGNOSIS — R1310 Dysphagia, unspecified: Secondary | ICD-10-CM | POA: Diagnosis not present

## 2017-11-29 DIAGNOSIS — R0602 Shortness of breath: Secondary | ICD-10-CM | POA: Diagnosis not present

## 2017-11-29 DIAGNOSIS — Z79899 Other long term (current) drug therapy: Secondary | ICD-10-CM | POA: Diagnosis not present

## 2017-11-29 DIAGNOSIS — R059 Cough, unspecified: Secondary | ICD-10-CM

## 2017-11-29 DIAGNOSIS — D638 Anemia in other chronic diseases classified elsewhere: Secondary | ICD-10-CM | POA: Diagnosis not present

## 2017-11-29 DIAGNOSIS — I129 Hypertensive chronic kidney disease with stage 1 through stage 4 chronic kidney disease, or unspecified chronic kidney disease: Secondary | ICD-10-CM | POA: Diagnosis present

## 2017-11-29 DIAGNOSIS — E875 Hyperkalemia: Secondary | ICD-10-CM | POA: Diagnosis present

## 2017-11-29 DIAGNOSIS — R5381 Other malaise: Principal | ICD-10-CM | POA: Diagnosis present

## 2017-11-29 DIAGNOSIS — N401 Enlarged prostate with lower urinary tract symptoms: Secondary | ICD-10-CM | POA: Diagnosis not present

## 2017-11-29 DIAGNOSIS — Z91041 Radiographic dye allergy status: Secondary | ICD-10-CM | POA: Diagnosis not present

## 2017-11-29 DIAGNOSIS — G20 Parkinson's disease: Secondary | ICD-10-CM | POA: Diagnosis present

## 2017-11-29 DIAGNOSIS — Z9841 Cataract extraction status, right eye: Secondary | ICD-10-CM | POA: Diagnosis not present

## 2017-11-29 DIAGNOSIS — Z888 Allergy status to other drugs, medicaments and biological substances status: Secondary | ICD-10-CM

## 2017-11-29 DIAGNOSIS — R351 Nocturia: Secondary | ICD-10-CM | POA: Diagnosis not present

## 2017-11-29 DIAGNOSIS — Z961 Presence of intraocular lens: Secondary | ICD-10-CM | POA: Diagnosis present

## 2017-11-29 DIAGNOSIS — Z8711 Personal history of peptic ulcer disease: Secondary | ICD-10-CM | POA: Diagnosis not present

## 2017-11-29 DIAGNOSIS — N179 Acute kidney failure, unspecified: Secondary | ICD-10-CM | POA: Diagnosis not present

## 2017-11-29 DIAGNOSIS — Z8679 Personal history of other diseases of the circulatory system: Secondary | ICD-10-CM | POA: Diagnosis not present

## 2017-11-29 DIAGNOSIS — Z85528 Personal history of other malignant neoplasm of kidney: Secondary | ICD-10-CM | POA: Diagnosis not present

## 2017-11-29 DIAGNOSIS — Z9842 Cataract extraction status, left eye: Secondary | ICD-10-CM

## 2017-11-29 DIAGNOSIS — E8809 Other disorders of plasma-protein metabolism, not elsewhere classified: Secondary | ICD-10-CM | POA: Diagnosis present

## 2017-11-29 DIAGNOSIS — E46 Unspecified protein-calorie malnutrition: Secondary | ICD-10-CM | POA: Diagnosis not present

## 2017-11-29 DIAGNOSIS — Z905 Acquired absence of kidney: Secondary | ICD-10-CM | POA: Diagnosis not present

## 2017-11-29 DIAGNOSIS — D696 Thrombocytopenia, unspecified: Secondary | ICD-10-CM | POA: Diagnosis not present

## 2017-11-29 DIAGNOSIS — J449 Chronic obstructive pulmonary disease, unspecified: Secondary | ICD-10-CM | POA: Diagnosis present

## 2017-11-29 DIAGNOSIS — F0390 Unspecified dementia without behavioral disturbance: Secondary | ICD-10-CM | POA: Diagnosis present

## 2017-11-29 DIAGNOSIS — Z87891 Personal history of nicotine dependence: Secondary | ICD-10-CM

## 2017-11-29 DIAGNOSIS — R350 Frequency of micturition: Secondary | ICD-10-CM | POA: Diagnosis present

## 2017-11-29 DIAGNOSIS — R531 Weakness: Secondary | ICD-10-CM | POA: Diagnosis not present

## 2017-11-29 DIAGNOSIS — I4891 Unspecified atrial fibrillation: Secondary | ICD-10-CM | POA: Diagnosis present

## 2017-11-29 DIAGNOSIS — D61818 Other pancytopenia: Secondary | ICD-10-CM | POA: Diagnosis present

## 2017-11-29 DIAGNOSIS — R05 Cough: Secondary | ICD-10-CM | POA: Diagnosis not present

## 2017-11-29 DIAGNOSIS — I10 Essential (primary) hypertension: Secondary | ICD-10-CM | POA: Diagnosis not present

## 2017-11-29 DIAGNOSIS — Z09 Encounter for follow-up examination after completed treatment for conditions other than malignant neoplasm: Secondary | ICD-10-CM

## 2017-11-29 DIAGNOSIS — N184 Chronic kidney disease, stage 4 (severe): Secondary | ICD-10-CM | POA: Diagnosis present

## 2017-11-29 DIAGNOSIS — D631 Anemia in chronic kidney disease: Secondary | ICD-10-CM | POA: Diagnosis present

## 2017-11-29 DIAGNOSIS — J181 Lobar pneumonia, unspecified organism: Secondary | ICD-10-CM | POA: Diagnosis not present

## 2017-11-29 LAB — IRON AND TIBC
Iron: 13 ug/dL — ABNORMAL LOW (ref 45–182)
Saturation Ratios: 7 % — ABNORMAL LOW (ref 17.9–39.5)
TIBC: 189 ug/dL — ABNORMAL LOW (ref 250–450)
UIBC: 176 ug/dL

## 2017-11-29 LAB — BASIC METABOLIC PANEL
ANION GAP: 11 (ref 5–15)
BUN: 59 mg/dL — AB (ref 6–20)
CO2: 28 mmol/L (ref 22–32)
Calcium: 8.5 mg/dL — ABNORMAL LOW (ref 8.9–10.3)
Chloride: 99 mmol/L — ABNORMAL LOW (ref 101–111)
Creatinine, Ser: 3.02 mg/dL — ABNORMAL HIGH (ref 0.61–1.24)
GFR, EST AFRICAN AMERICAN: 20 mL/min — AB (ref >60)
GFR, EST NON AFRICAN AMERICAN: 17 mL/min — AB (ref >60)
Glucose, Bld: 109 mg/dL — ABNORMAL HIGH (ref 65–99)
Potassium: 4.1 mmol/L (ref 3.5–5.1)
SODIUM: 138 mmol/L (ref 135–145)

## 2017-11-29 LAB — CBC
HCT: 28.1 % — ABNORMAL LOW (ref 39.0–52.0)
Hemoglobin: 9.2 g/dL — ABNORMAL LOW (ref 13.0–17.0)
MCH: 31.3 pg (ref 26.0–34.0)
MCHC: 32.7 g/dL (ref 30.0–36.0)
MCV: 95.6 fL (ref 78.0–100.0)
Platelets: 96 10*3/uL — ABNORMAL LOW (ref 150–400)
RBC: 2.94 MIL/uL — AB (ref 4.22–5.81)
RDW: 14.1 % (ref 11.5–15.5)
WBC: 3.3 10*3/uL — AB (ref 4.0–10.5)

## 2017-11-29 LAB — FERRITIN: FERRITIN: 207 ng/mL (ref 24–336)

## 2017-11-29 MED ORDER — SODIUM CHLORIDE 0.9 % IV SOLN
125.0000 mg | Freq: Every day | INTRAVENOUS | Status: DC
  Filled 2017-11-29: qty 10

## 2017-11-29 MED ORDER — DONEPEZIL HCL 5 MG PO TABS
5.0000 mg | ORAL_TABLET | Freq: Every day | ORAL | Status: DC
  Administered 2017-11-29 – 2017-12-06 (×8): 5 mg via ORAL
  Filled 2017-11-29 (×8): qty 1

## 2017-11-29 MED ORDER — ORAL CARE MOUTH RINSE
15.0000 mL | Freq: Two times a day (BID) | OROMUCOSAL | Status: DC
  Administered 2017-11-29 – 2017-12-06 (×12): 15 mL via OROMUCOSAL

## 2017-11-29 MED ORDER — GUAIFENESIN ER 600 MG PO TB12
600.0000 mg | ORAL_TABLET | Freq: Two times a day (BID) | ORAL | Status: DC
  Administered 2017-11-29: 600 mg via ORAL
  Filled 2017-11-29 (×2): qty 1

## 2017-11-29 MED ORDER — CARBIDOPA-LEVODOPA ER 25-100 MG PO TBCR
0.5000 | EXTENDED_RELEASE_TABLET | Freq: Two times a day (BID) | ORAL | Status: DC
  Administered 2017-11-29 – 2017-11-30 (×2): 0.5 via ORAL
  Filled 2017-11-29 (×3): qty 0.5

## 2017-11-29 MED ORDER — OXYCODONE HCL 5 MG/5ML PO SOLN: 5.0000 mg | Freq: Once | ORAL | Status: DC | PRN

## 2017-11-29 MED ORDER — OXYCODONE HCL 5 MG PO TABS: 5.0000 mg | ORAL_TABLET | Freq: Once | ORAL | Status: DC | PRN

## 2017-11-29 MED ORDER — PREDNISOLONE ACETATE 1 % OP SUSP
1.0000 [drp] | Freq: Two times a day (BID) | OPHTHALMIC | Status: DC
  Administered 2017-11-30 – 2017-12-07 (×15): 1 [drp] via OPHTHALMIC
  Filled 2017-11-29 (×2): qty 1

## 2017-11-29 MED ORDER — HYDRALAZINE HCL 10 MG PO TABS
10.0000 mg | ORAL_TABLET | Freq: Three times a day (TID) | ORAL | Status: DC
  Administered 2017-11-29: 10 mg via ORAL
  Filled 2017-11-29: qty 1

## 2017-11-29 MED ORDER — SODIUM CHLORIDE 0.9 % IV SOLN
125.0000 mg | Freq: Every day | INTRAVENOUS | Status: AC
  Administered 2017-11-30 – 2017-12-02 (×3): 125 mg via INTRAVENOUS
  Filled 2017-11-29 (×4): qty 10

## 2017-11-29 MED ORDER — ALBUTEROL SULFATE (2.5 MG/3ML) 0.083% IN NEBU
2.5000 mg | INHALATION_SOLUTION | RESPIRATORY_TRACT | Status: DC | PRN
  Administered 2017-11-29 – 2017-11-30 (×2): 2.5 mg via RESPIRATORY_TRACT
  Filled 2017-11-29 (×2): qty 3

## 2017-11-29 MED ORDER — DOCUSATE SODIUM 100 MG PO CAPS
100.0000 mg | ORAL_CAPSULE | Freq: Every day | ORAL | Status: DC
  Filled 2017-11-29: qty 1

## 2017-11-29 MED ORDER — SORBITOL 70 % SOLN: 30.0000 mL | Freq: Every day | Status: DC | PRN

## 2017-11-29 MED ORDER — ONDANSETRON HCL 4 MG/2ML IJ SOLN: 4.0000 mg | Freq: Four times a day (QID) | INTRAMUSCULAR | Status: DC | PRN

## 2017-11-29 MED ORDER — HYDRALAZINE HCL 10 MG PO TABS
10.0000 mg | ORAL_TABLET | Freq: Three times a day (TID) | ORAL | Status: DC
  Administered 2017-11-29 – 2017-12-01 (×4): 10 mg via ORAL
  Filled 2017-11-29 (×5): qty 1

## 2017-11-29 NOTE — H&P (Signed)
Physical Medicine and Rehabilitation Admission H&P    Chief complaint: Weakness  [id number] [name] is a 83 y.o. right handed male with history significant for CKD stage IV, dementia, parkinsonian maintained on Sinemet, AAA with prior endovascular repair and angiogram 2009, renal cell carcinoma solitary left kidney, hypertension, COPD, chronic anemia with pancytopenia. Per chart review and daughter, patient lives alone and independent. He does not drive. Family checks on him as needed. One level home. Presented 11/23/2017 after seeing his nephrologist for possible AAA endoleak repair by interventional radiology. 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 Postprocedure mildly hypoxic. Chest x-ray showed right lower lobe consolidation versus atelectasis. Patient remained on oxygen therapy and follow-up her pulmonary services. Patient recovered nicely and pulmonary services signed off 11/28/2017. Renal services follow-up for CKD stage III with elevated creatinine. Renal ultrasound unremarkable left kidney. Latest creatinine 3.02 with expected slow recovery. On 11/28/2017 cardiology services consulted for new onset atrial fibrillation with slow ventricular response felt to be exacerbated by his current medical illness. Patient had no chest pain or shortness of breath. Echocardiogram completed 11/28/2017 showing ejection fraction of 60% no wall motion abnormalities. Patient not felt to be a Coumadin candidate due to fall risk as well with noted history of pancytopenia Physical and occupational therapy evaluations completed with recommendations of physical medicine rehabilitation consult. Patient was admitted for a comprehensive rehabilitation program     Review of Systems  Constitutional: Negative for chills and fever.       Generalized weakness  HENT: Negative for hearing loss.   Eyes: Negative for blurred  vision and double vision.  Respiratory: Positive for shortness of breath.   Cardiovascular: Positive for leg swelling. Negative for chest pain and palpitations.  Gastrointestinal: Positive for constipation. Negative for nausea and vomiting.  Musculoskeletal: Positive for joint pain and myalgias.  Skin: Negative for rash.  Psychiatric/Behavioral: Positive for memory loss.  All other systems reviewed and are negative.  Past Medical History:  Diagnosis Date  . Abdominal aortic aneurysm ([place]) 09/2008   s/o endovascular repair and angiogram 10/04/08   . Anemia    EGD Cscope 08-2012  . Arthritis    "hips" (11/23/2017)  . Atrial fibrillation ([place])   . Bleeding stomach ulcer 1990s  . Bleeding ulcer 1997  . CKD (chronic kidney disease), stage IV ([facility])    Stable, Dr. Dimas Aguas  . COPD (chronic obstructive pulmonary disease) ([place])    "mild" (11/23/2017)  . Dementia    "on very small dose of Aricept" (11/23/2017)  . H/O hyperkalemia    Stable  . Hypertension   . Leukopenia 2/11   thought to be from doxy  . Pneumonia 2018 X 2  . Renal cell carcinoma    clear cell type dx 10/09, s/p excision 10-24-08  . Renal insufficiency   . Solitary left kidney    W/ chronic tubule-interstitial damage, Dr. Dimas Aguas  . Spinal stenosis    causing LE weakness-persistant, being worked up by Hydrographic surveyor and neurologist   Past Surgical History:  Procedure Laterality Date  . ABDOMINAL AORTIC ANEURYSM REPAIR  2009   EVAR  . CATARACT EXTRACTION W/ INTRAOCULAR LENS  IMPLANT, BILATERAL Bilateral   . HEMORRHOID SURGERY  1990  . IR ANGIOGRAM PELVIS SELECTIVE OR SUPRASELECTIVE  11/24/2017  . IR ANGIOGRAM SELECTIVE EACH ADDITIONAL VESSEL  11/24/2017  . IR ANGIOGRAM SELECTIVE EACH ADDITIONAL VESSEL  11/24/2017  . IR ANGIOGRAM SELECTIVE EACH ADDITIONAL  VESSEL  11/24/2017  . IR ANGIOGRAM SELECTIVE EACH ADDITIONAL VESSEL  11/24/2017  . IR ANGIOGRAM VISCERAL SELECTIVE  11/24/2017  . IR ANGIOGRAM VISCERAL  SELECTIVE  11/24/2017  . IR RADIOLOGIST EVAL & MGMT  09/07/2017  . IR US GUIDE VASC ACCESS RIGHT  11/24/2017  . [place] ([place]), 2004  . NEPHRECTOMY  2009   Open nephrectomy for cancer   Family History  Problem Relation Age of Onset  . Diabetes Father        ?  . Stroke Father   . Hypertension Father   . Colon cancer Neg Hx   . Prostate cancer Neg Hx    Social History:  reports that he quit smoking about 9 years ago. He has a 90.00 pack-year smoking history. he has never used smokeless tobacco. He reports that he does not drink alcohol or use drugs. Allergies:  Allergies  Allergen Reactions  . Iodinated Diagnostic Agents Other (See Comments)    Only one kidney   . Ioxaglate Other (See Comments)    Only one kidney   . Baclofen Other (See Comments)    Unable to walk per Pt's family    Medications Prior to Admission  Medication Sig Dispense Refill  . Carbidopa-Levodopa ER (SINEMET CR) 25-100 MG tablet controlled release Take 0.5 tablets by mouth 2 (two) times daily.    . Cholecalciferol (VITAMIN D) 2000 units CAPS Take 2,000 Units by mouth daily.    Marland Kitchen docusate sodium (COLACE) 100 MG capsule Take 100 mg by mouth daily.    Marland Kitchen donepezil (ARICEPT) 5 MG tablet Take 5 mg by mouth at bedtime.    . FeFum-FePo-FA-B Cmp-C-Zn-Mn-Cu (SE-TAN PLUS) 162-115.2-1 MG CAPS Take 1 capsule by mouth daily. Take 1 Tab  daily    . hydrALAZINE (APRESOLINE) 25 MG tablet Take 25 mg by mouth 3 (three) times daily.    . hydrochlorothiazide (HYDRODIURIL) 12.5 MG tablet Take 12.5 mg daily by mouth.    . labetalol (NORMODYNE) 100 MG tablet Take 1 tablet (100 mg total) by mouth 2 (two) times daily. 60 tablet 6  . prednisoLONE acetate (PRED FORTE) 1 % ophthalmic suspension Place 1 drop into both eyes 2 (two) times daily.  0  . pregabalin (LYRICA) 50 MG capsule Take 50 mg by mouth at bedtime.     . sodium polystyrene (KAYEXALATE) 15 GM/60ML suspension Take 15 g by mouth 2 (two) times a week. Take 15  Grams on Monday and Friday      Drug Regimen Review Drug regimen was reviewed and remains appropriate with no significant issues identified  Home: Home Living Family/patient expects to be discharged to:: Private residence Living Arrangements: Alone Available Help at Discharge: (daughter and son in law can provide 24/7 assist; they are ve) Type of Home: House Home Access: Stairs to enter [employer] of Steps: 1 Home Layout: One level Bathroom Shower/Tub: Multimedia programmer: Standard Bathroom Accessibility: Yes Home Equipment: Environmental consultant - 2 wheels, Cane - single point, Bedside commode, Shower seat  Lives With: Alone   Functional History: Prior Function Level of Independence: Independent Comments: pt's daughter drives him wherever he needs to go. Pt was receiving OP therapy services 2x/week  Functional Status:  Mobility: Bed Mobility Overal bed mobility: Needs Assistance Bed Mobility: Supine to Sit Supine to sit: Min assist General bed mobility comments: pt up in recliner with PT upon arrival Transfers Overall transfer level: Needs assistance Equipment used: Rolling walker (2 wheeled) Transfers: Sit  to/from Stand, Risk manager Sit to Stand: Mod assist, +2 safety/equipment Stand pivot transfers: Max assist, +2 physical assistance, +2 safety/equipment General transfer comment: Moderate physical assist to power up to stand x3, initial posterior bias noted, Multi modal cues for posture and positioning. With cues patient able to correct to upright and bring weight forward to improve positioning with RW Ambulation/Gait Ambulation/Gait assistance: Mod assist, +2 physical assistance Ambulation Distance (Feet): 40 Feet(40 x3) Assistive device: Rolling walker (2 wheeled) Gait Pattern/deviations: Step-to pattern, Trunk flexed, Narrow base of support, Festinating, Decreased stride length General Gait Details: Patient with very festinating gait and bilateral  LEs buckling initially, VCs for increased gait speed and stide, cued to over exagerate steps with marked improvement in patients ability Gait velocity: decreased    ADL: ADL Overall ADL's : Needs assistance/impaired Eating/Feeding: Set up, Sitting Grooming: Set up, Supervision/safety, Sitting, Wash/dry face, Wash/dry hands Upper Body Bathing: Min guard, Sitting Lower Body Bathing: Minimal assistance, Sitting/lateral leans Upper Body Dressing : Min guard, Sitting Lower Body Dressing: Moderate assistance, +2 for physical assistance, +2 for safety/equipment, Sit to/from stand Lower Body Dressing Details (indicate cue type and reason): Pt able to reach and adjust socks sitting in recliner  Toilet Transfer: +2 for safety/equipment, +2 for physical assistance, RW, Moderate assistance, Ambulation, Stand-pivot Toilet Transfer Details (indicate cue type and reason): simulated in transfer to [place] and Hygiene: +2 for physical assistance, +2 for safety/equipment, Sit to/from stand, Moderate assistance Functional mobility during ADLs: +2 for physical assistance, +2 for safety/equipment, Rolling walker, Moderate assistance General ADL Comments: Pt with B knee buckling during transfer and ambulation requiring increased assist  Cognition: Cognition Overall Cognitive Status: History of cognitive impairments - at baseline Orientation Level: Oriented to person, Oriented to place, Oriented to situation Cognition Arousal/Alertness: Awake/alert Behavior During Therapy: WFL for tasks assessed/performed Overall Cognitive Status: History of cognitive impairments - at baseline General Comments: pt with dementia at baseline  Physical Exam: Blood pressure (!) 166/65, pulse 65, temperature 98.4 F (36.9 C), temperature source Oral, resp. rate 18, height '5\' 8"'$  (1.727 m), weight 70.8 kg (156 lb 1.4 oz), SpO2 95 %. Physical Exam Vitals reviewed. Constitutional: He appears  well-developed.  Frail patient is sitting in his chair in appearing  HENT:  Head: Normocephalic and atraumatic.  Eyes: EOM are normal. Right eye exhibits no discharge. Left eye exhibits no discharge.  Neck: Normal range of motion. Neck supple. No thyromegaly present.  Cardiovascular:  Irregularly irregular without murmur  Respiratory:  Limited inspiratory effort, breath sounds may have been a bit decreased at the right base   +[place]  GI: Soft. Bowel sounds are normal. He exhibits no distension/nontender Skin. Warm and dry Musculoskeletal: He exhibits edema (LE). He exhibits no tenderness.  Neurological: Appears somewhat lethargic.  He awakens to verbal and tactile cuing.  He had difficulties following basic simple commands seemingly distracted at times.  Family member in the room to continuously assist him with answers.  He is oriented to person and place.  Strength in the upper extremities 3- to 3 out of 5 with deltoids biceps and triceps and 3-3+ with wrist and hand.  Lower extremity strength is 3- hip flexion knee extension to 3 out of 5 ankle flexion dorsiflexion.  Does appear to be intact to pain and light touch.  Reflexes are symmetrical and 1+.  He is hard of hearing.  Speech is dysarthric. Psych: Patient fairly pleasant when awake       Results  for orders placed or performed during the hospital encounter of 11/23/17 (from the past 48 hour(s))  Basic metabolic panel     Status: Abnormal   Collection Time: 11/28/17  5:43 AM  Result Value Ref Range   Sodium 135 135 - 145 mmol/L   Potassium 4.3 3.5 - 5.1 mmol/L   Chloride 101 101 - 111 mmol/L   CO2 25 22 - 32 mmol/L   Glucose, Bld 108 (H) 65 - 99 mg/dL   BUN 60 (H) 6 - 20 mg/dL   Creatinine, Ser 3.33 (H) 0.61 - 1.24 mg/dL   Calcium 8.1 (L) 8.9 - 10.3 mg/dL   GFR calc non Af Amer 15 (L) >60 mL/min   GFR calc Af Amer 18 (L) >60 mL/min    Comment: (NOTE) The eGFR has been calculated using the CKD EPI equation. This calculation has  not been validated in all clinical situations. eGFR's persistently <60 mL/min signify possible Chronic Kidney Disease.    Anion gap 9 5 - 15  TSH     Status: None   Collection Time: 11/28/17  6:07 AM  Result Value Ref Range   TSH 2.836 0.350 - 4.500 uIU/mL    Comment: Performed by a 3rd Generation assay with a functional sensitivity of <=0.01 uIU/mL.  Iron and TIBC     Status: Abnormal   Collection Time: 11/29/17  4:29 AM  Result Value Ref Range   Iron 13 (L) 45 - 182 ug/dL   TIBC 189 (L) 250 - 450 ug/dL   Saturation Ratios 7 (L) 17.9 - 39.5 %   UIBC 176 ug/dL  Ferritin     Status: None   Collection Time: 11/29/17  4:29 AM  Result Value Ref Range   Ferritin 207 24 - 336 ng/mL  Basic metabolic panel     Status: Abnormal   Collection Time: 11/29/17  4:29 AM  Result Value Ref Range   Sodium 138 135 - 145 mmol/L   Potassium 4.1 3.5 - 5.1 mmol/L   Chloride 99 (L) 101 - 111 mmol/L   CO2 28 22 - 32 mmol/L   Glucose, Bld 109 (H) 65 - 99 mg/dL   BUN 59 (H) 6 - 20 mg/dL   Creatinine, Ser 3.02 (H) 0.61 - 1.24 mg/dL   Calcium 8.5 (L) 8.9 - 10.3 mg/dL   GFR calc non Af Amer 17 (L) >60 mL/min   GFR calc Af Amer 20 (L) >60 mL/min    Comment: (NOTE) The eGFR has been calculated using the CKD EPI equation. This calculation has not been validated in all clinical situations. eGFR's persistently <60 mL/min signify possible Chronic Kidney Disease.    Anion gap 11 5 - 15  CBC     Status: Abnormal   Collection Time: 11/29/17  4:29 AM  Result Value Ref Range   WBC 3.3 (L) 4.0 - 10.5 K/uL   RBC 2.94 (L) 4.22 - 5.81 MIL/uL   Hemoglobin 9.2 (L) 13.0 - 17.0 g/dL   HCT 28.1 (L) 39.0 - 52.0 %   MCV 95.6 78.0 - 100.0 fL   MCH 31.3 26.0 - 34.0 pg   MCHC 32.7 30.0 - 36.0 g/dL   RDW 14.1 11.5 - 15.5 %   Platelets 96 (L) 150 - 400 K/uL    Comment: CONSISTENT WITH PREVIOUS RESULT   US Renal  Result Date: 11/27/2017 CLINICAL DATA:  83 year old male with acute kidney injury. History of right  nephrectomy for renal cell carcinoma. EXAM: RENAL / URINARY TRACT  ULTRASOUND COMPLETE COMPARISON:  11/25/1999 FINDINGS: Right Kidney: Not visualized compatible with nephrectomy. Left Kidney: Length: 11.1 cm. Renal echogenicity is normal. Multiple renal cysts are identified, the largest measuring 3.4 cm in the mid-upper left kidney. No hydronephrosis or solid mass identified. Bladder: Appears normal for degree of bladder distention. IMPRESSION: 1. Unremarkable left kidney except for renal cysts. 2. Status post right nephrectomy 3. Unremarkable bladder Electronically Signed   By: Margarette Canada M.D.   On: 11/27/2017 16:57   Dg Chest Port 1 View  Result Date: 11/27/2017 CLINICAL DATA:  Respiratory failure EXAM: PORTABLE CHEST 1 VIEW COMPARISON:  11/26/2017 FINDINGS: Cardiomegaly with vascular congestion, small effusions and bibasilar atelectasis. Probable mild perihilar/interstitial edema. No real change since prior study. IMPRESSION: No interval change. Electronically Signed   By: Rolm Baptise M.D.   On: 11/27/2017 08:36       Medical Problem List and Plan: 1.  Debility secondary to acute respiratory failure with hypoxia  -Admit to inpatient rehab 2.  DVT Prophylaxis/Anticoagulation: SCDs. Monitor for any signs of DVT 3. Pain Management: Oxycodone. Monitor mental status 4. Mood/dementia: Aricept 5 mg daily 5. Neuropsych: This patient is capable of making decisions on his own behalf. 6. Skin/Wound Care: Routine skin checks 7. Fluids/Electrolytes/Nutrition: Routine I&O's with follow-up chemistries 8. New onset atrial fibrillation.  Follow-up per cardiology services. Currently not a candidate for Coumadin therapy. Patient is asymptomatic 9. Acute on chronic kidney disease stage IV as well as hyperkalemia. Follow-up per renal services. Patient was using Kayexalate 15 g 2 times a week Mondays and Fridays prior to admission. Follow electrolytes closely. He is followed by [facility] nephrology [place], [place] Dr. Dimas Aguas 10. History of AAA with concern for endoleak. Follow-up interventional radiology no leak identified on workup 11. History of renal cell carcinoma with solitary kidney. Monitor urine output 12. Anemia of chronic disease. Continue Aranesp 13. Pancytopenia/thrombocytopenia. Followed hematology in [employer] 14. History of Parkinson's disease. Continue Sinemet.    Post Admission Physician Evaluation: 1. Functional deficits secondary  to debility after acute respiratory failure. 2. Patient is admitted to receive collaborative, interdisciplinary care between the physiatrist, rehab nursing staff, and therapy team. 3. Patient's level of medical complexity and substantial therapy needs in context of that medical necessity cannot be provided at a lesser intensity of care such as a SNF. 4. Patient has experienced substantial functional loss from his/her baseline which was documented above under the "Functional History" and "Functional Status" headings.  Judging by the patient's diagnosis, physical exam, and functional history, the patient has potential for functional progress which will result in measurable gains while on inpatient rehab.  These gains will be of substantial and practical use upon discharge  in facilitating mobility and self-care at the household level. 5. Physiatrist will provide 24 hour management of medical needs as well as oversight of the therapy plan/treatment and provide guidance as appropriate regarding the interaction of the two. 6. The Preadmission Screening has been reviewed and patient status is unchanged unless otherwise stated above. 7. 24 hour rehab nursing will assist with bladder management, bowel management, safety, skin/wound care, disease management, medication administration, pain management and patient education  and help integrate therapy concepts, techniques,education, etc. 8. PT will assess and treat for/with: Lower  extremity strength, range of motion, stamina, balance, functional mobility, safety, adaptive techniques and equipment, neuromuscular reeducation, cognitive perceptual remediation, family education.   Goals are: Supervision to minimal assistance. 9. OT will assess and treat for/with: ADL's,  functional mobility, safety, upper extremity strength, adaptive techniques and equipment, neuromuscular reeducation   cognitive perceptual remediation, family education.   Goals are: Supervision to minimal assistance . Therapy may proceed with showering this patient. 10. SLP will assess and treat for/with: Swallowing,  speech, communication.  Goals are: Supervision. 11. Case Management and Social Worker will assess and treat for psychological issues and discharge planning. 12. Team conference will be held weekly to assess progress toward goals and to determine barriers to discharge. 13. Patient will receive at least 3 hours of therapy per day at least 5 days per week. 14. ELOS: 16-20 days       15. Prognosis:  excellent     Meredith Staggers, MD, [facility] 11/29/2017  Lavon Paganini Dennard, PA-C 11/29/2017

## 2017-11-29 NOTE — Progress Notes (Signed)
Physical Medicine and Rehabilitation Consult Reason for Consult: Decreased functional mobility Referring Physician: [facility]   HPI: [name] is a 83 y.o. right handed male with history significant for CKD stage III, dementia, parkinsonian maintained on Sinemet, AAA with prior endovascular repair and angiogram 2009, renal cell carcinoma solitary left kidney, hypertension, COPD, chronic anemia with pancytopenia. Per chart review and daughter, patient lives alone and independent. He does not drive. Family checks on him as needed. One level home. Presented 11/23/2017 after seeing his nephrologist for possible AAA endoleak repair by interventional radiology. 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 Postprocedure mildly hypoxic. Chest x-ray showed right lower lobe consolidation versus atelectasis. Patient remained on oxygen therapy and follow-up her pulmonary services. Patient recovered nicely and pulmonary services signed off 11/28/2017. Renal services follow-up for CKD stage III with elevated creatinine. Renal ultrasound unremarkable left kidney. Latest creatinine 3.33 with expected slow recovery. Physical therapy evaluation completed with recommendations of physical medicine rehabilitation consult.  Review of Systems  Constitutional: Negative for chills and fever.  HENT: Negative for hearing loss.   Eyes: Negative for blurred vision and double vision.  Respiratory: Negative for cough.        Shortness of breath with exertion  Cardiovascular: Positive for leg swelling. Negative for chest pain and palpitations.  Gastrointestinal: Positive for constipation. Negative for nausea.  Genitourinary: Negative for dysuria, flank pain and hematuria.  Musculoskeletal: Positive for joint pain and myalgias.  Skin: Negative for rash.  Neurological: Negative for seizures.  Psychiatric/Behavioral: Positive for  memory loss.  All other systems reviewed and are negative.      Past Medical History:  Diagnosis Date  . Abdominal aortic aneurysm ([place]) 09/2008   s/o endovascular repair and angiogram 10/04/08   . Anemia    EGD Cscope 08-2012  . Arthritis    "hips" (11/23/2017)  . Bleeding stomach ulcer 1990s  . Bleeding ulcer 1997  . CKD (chronic kidney disease), stage IV (H[facility])    Stable, Dr. Dimas Aguas  . COPD (chronic obstructive pulmonary disease) ([place])    "mild" (11/23/2017)  . Dementia    "on very small dose of Aricept" (11/23/2017)  . H/O hyperkalemia    Stable  . Hypertension   . Leukopenia 2/11   thought to be from doxy  . Pneumonia 2018 X 2  . Renal cell carcinoma    clear cell type dx 10/09, s/p excision 10-24-08  . Renal insufficiency   . Solitary left kidney    W/ chronic tubule-interstitial damage, Dr. Dimas Aguas  . Spinal stenosis    causing LE weakness-persistant, being worked up by Hydrographic surveyor and neurologist        Past Surgical History:  Procedure Laterality Date  . ABDOMINAL AORTIC ANEURYSM REPAIR  2009   EVAR  . CATARACT EXTRACTION W/ INTRAOCULAR LENS  IMPLANT, BILATERAL Bilateral   . HEMORRHOID SURGERY  1990  . IR ANGIOGRAM PELVIS SELECTIVE OR SUPRASELECTIVE  11/24/2017  . IR ANGIOGRAM SELECTIVE EACH ADDITIONAL VESSEL  11/24/2017  . IR ANGIOGRAM SELECTIVE EACH ADDITIONAL VESSEL  11/24/2017  . IR ANGIOGRAM SELECTIVE EACH ADDITIONAL VESSEL  11/24/2017  . IR ANGIOGRAM SELECTIVE EACH ADDITIONAL VESSEL  11/24/2017  . IR ANGIOGRAM VISCERAL SELECTIVE  11/24/2017  . IR ANGIOGRAM VISCERAL SELECTIVE  11/24/2017  . IR RADIOLOGIST EVAL & MGMT  09/07/2017  . IR US GUIDE VASC ACCESS RIGHT  11/24/2017  . [place] (lip),  2004  . NEPHRECTOMY  2009   Open nephrectomy for cancer        Family History  Problem Relation Age of Onset  . Diabetes Father        ?  . Stroke Father   . Hypertension Father   . Colon  cancer Neg Hx   . Prostate cancer Neg Hx    Social History:  reports that he quit smoking about 9 years ago. He has a 90.00 pack-year smoking history. he has never used smokeless tobacco. He reports that he does not drink alcohol or use drugs. Allergies:       Allergies  Allergen Reactions  . Iodinated Diagnostic Agents Other (See Comments)    Only one kidney   . Ioxaglate Other (See Comments)    Only one kidney   . Baclofen Other (See Comments)    Unable to walk per Pt's family          Medications Prior to Admission  Medication Sig Dispense Refill  . Carbidopa-Levodopa ER (SINEMET CR) 25-100 MG tablet controlled release Take 0.5 tablets by mouth 2 (two) times daily.    . Cholecalciferol (VITAMIN D) 2000 units CAPS Take 2,000 Units by mouth daily.    Marland Kitchen docusate sodium (COLACE) 100 MG capsule Take 100 mg by mouth daily.    Marland Kitchen donepezil (ARICEPT) 5 MG tablet Take 5 mg by mouth at bedtime.    . FeFum-FePo-FA-B Cmp-C-Zn-Mn-Cu (SE-TAN PLUS) 162-115.2-1 MG CAPS Take 1 capsule by mouth daily. Take 1 Tab  daily    . hydrALAZINE (APRESOLINE) 25 MG tablet Take 25 mg by mouth 3 (three) times daily.    . hydrochlorothiazide (HYDRODIURIL) 12.5 MG tablet Take 12.5 mg daily by mouth.    . labetalol (NORMODYNE) 100 MG tablet Take 1 tablet (100 mg total) by mouth 2 (two) times daily. 60 tablet 6  . prednisoLONE acetate (PRED FORTE) 1 % ophthalmic suspension Place 1 drop into both eyes 2 (two) times daily.  0  . pregabalin (LYRICA) 50 MG capsule Take 50 mg by mouth at bedtime.     . sodium polystyrene (KAYEXALATE) 15 GM/60ML suspension Take 15 g by mouth 2 (two) times a week. Take 15 Grams on Monday and Friday      Home: [place] expects to be discharged to:: Private residence Living Arrangements: Alone Available Help at Discharge: Family, Available 24 hours/day Type of Home: House Home Access: Stairs to enter [employer] of Steps:  1 Home Layout: One level Bathroom Shower/Tub: Multimedia programmer: [place]: Environmental consultant - 2 wheels, [employer] - single point, Bedside commode, Shower seat  Functional History: Prior Function Level of Independence: Independent Comments: pt's daughter drives him wherever he needs to go. Pt was receiving OP therapy services 2x/week Functional Status:  Mobility: Bed Mobility Overal bed mobility: Needs Assistance Bed Mobility: Supine to Sit Supine to sit: Min assist General bed mobility comments: pt up in recliner with PT upon arrival Transfers Overall transfer level: Needs assistance Equipment used: Rolling walker (2 wheeled) Transfers: Sit to/from Stand, [employer] Transfers Sit to Stand: Mod assist, +2 safety/equipment Stand pivot transfers: Max assist, +2 physical assistance, +2 safety/equipment General transfer comment: Moderate physical assist to power up to stand x3, initial posterior bias noted, Multi modal cues for posture and positioning. With cues patient able to correct to upright and bring weight forward to improve positioning with RW Ambulation/Gait Ambulation/Gait assistance: Mod assist, +2 physical assistance Ambulation Distance (Feet): 40 Feet(40  x3) Assistive device: Rolling walker (2 wheeled) Gait Pattern/deviations: Step-to pattern, Trunk flexed, Narrow base of support, Festinating, Decreased stride length General Gait Details: Patient with very festinating gait and bilateral LEs buckling initially, VCs for increased gait speed and stide, cued to over exagerate steps with marked improvement in patients ability Gait velocity: decreased  ADL: ADL Overall ADL's : Needs assistance/impaired Eating/Feeding: Set up, Sitting Grooming: Set up, Supervision/safety, Sitting, Wash/dry face, Wash/dry hands Upper Body Bathing: Min guard, Sitting Lower Body Bathing: Minimal assistance, Sitting/lateral leans Upper Body Dressing : Min guard, Sitting Lower Body  Dressing: Moderate assistance, +2 for physical assistance, +2 for safety/equipment, Sit to/from stand Lower Body Dressing Details (indicate cue type and reason): Pt able to reach and adjust socks sitting in recliner  Toilet Transfer: +2 for safety/equipment, +2 for physical assistance, RW, Moderate assistance, Ambulation, Stand-pivot Toilet Transfer Details (indicate cue type and reason): simulated in transfer to [place] and Hygiene: +2 for physical assistance, +2 for safety/equipment, Sit to/from stand, Moderate assistance Functional mobility during ADLs: +2 for physical assistance, +2 for safety/equipment, Rolling walker, Moderate assistance General ADL Comments: Pt with B knee buckling during transfer and ambulation requiring increased assist  Cognition: Cognition Overall Cognitive Status: History of cognitive impairments - at baseline Orientation Level: Oriented to person, Oriented to place, Oriented to situation Cognition Arousal/Alertness: Awake/alert Behavior During Therapy: WFL for tasks assessed/performed Overall Cognitive Status: History of cognitive impairments - at baseline General Comments: pt with dementia at baseline  Blood pressure (!) 127/55, pulse 68, temperature 98.7 F (37.1 C), temperature source Oral, resp. rate 18, height 5\' 8"  (1.727 m), weight 70.8 kg (156 lb 1.4 oz), SpO2 96 %. Physical Exam  Vitals reviewed. Constitutional: He appears well-developed.  Frail  HENT:  Head: Normocephalic and atraumatic.  Eyes: EOM are normal. Right eye exhibits no discharge. Left eye exhibits no discharge.  Neck: Normal range of motion. Neck supple. No thyromegaly present.  Cardiovascular:  Irregularly irregular  Respiratory:  Limited inspiratory effort but clear to auscultation +Tynan  GI: Soft. Bowel sounds are normal. He exhibits no distension.  Musculoskeletal: He exhibits edema (LE). He exhibits no tenderness.  Neurological: He is  alert.  Makes eye contact with examiner.  Daughter injects and answers/cues patient.  A&Ox3 with cues  Limited medical historian. Motor: Grossly 4/5 throughout Sensation intact to light touch HOH  Skin: Skin is warm and dry.  Psychiatric: His affect is blunt. His speech is delayed. He is not slowed.    LabResultsLast24Hours       Results for orders placed or performed during the hospital encounter of 11/23/17 (from the past 24 hour(s))  Basic metabolic panel     Status: Abnormal   Collection Time: 11/28/17  5:43 AM  Result Value Ref Range   Sodium 135 135 - 145 mmol/L   Potassium 4.3 3.5 - 5.1 mmol/L   Chloride 101 101 - 111 mmol/L   CO2 25 22 - 32 mmol/L   Glucose, Bld 108 (H) 65 - 99 mg/dL   BUN 60 (H) 6 - 20 mg/dL   Creatinine, Ser 3.33 (H) 0.61 - 1.24 mg/dL   Calcium 8.1 (L) 8.9 - 10.3 mg/dL   GFR calc non Af Amer 15 (L) >60 mL/min   GFR calc Af Amer 18 (L) >60 mL/min   Anion gap 9 5 - 15  TSH     Status: None   Collection Time: 11/28/17  6:07 AM  Result Value Ref Range  TSH 2.836 0.350 - 4.500 uIU/mL      ImagingResults(Last48hours)  US Renal  Result Date: 11/27/2017 CLINICAL DATA:  83 year old male with acute kidney injury. History of right nephrectomy for renal cell carcinoma. EXAM: RENAL / URINARY TRACT ULTRASOUND COMPLETE COMPARISON:  11/25/1999 FINDINGS: Right Kidney: Not visualized compatible with nephrectomy. Left Kidney: Length: 11.1 cm. Renal echogenicity is normal. Multiple renal cysts are identified, the largest measuring 3.4 cm in the mid-upper left kidney. No hydronephrosis or solid mass identified. Bladder: Appears normal for degree of bladder distention. IMPRESSION: 1. Unremarkable left kidney except for renal cysts. 2. Status post right nephrectomy 3. Unremarkable bladder Electronically Signed   By: Margarette Canada M.D.   On: 11/27/2017 16:57   Dg Chest Port 1 View  Result Date: 11/27/2017 CLINICAL DATA:  Respiratory failure  EXAM: PORTABLE CHEST 1 VIEW COMPARISON:  11/26/2017 FINDINGS: Cardiomegaly with vascular congestion, small effusions and bibasilar atelectasis. Probable mild perihilar/interstitial edema. No real change since prior study. IMPRESSION: No interval change. Electronically Signed   By: Rolm Baptise M.D.   On: 11/27/2017 08:36     Assessment/Plan: Diagnosis: Debility Labs independently reviewed.  Records reviewed and summated above.  1. Does the need for close, 24 hr/day medical supervision in concert with the patient's rehab needs make it unreasonable for this patient to be served in a less intensive setting? Yes  2. Co-Morbidities requiring supervision/potential complications: CKD stage III (avoid nephrotoxic meds), dementia (cont meds), parkinsonian syndrome (cont meds), AAA with prior endovascular repair and angiogram 2009, renal cell carcinoma solitary left kidney (avoid nephrotoxic meds), HTN (monitor and provide prns in accordance with increased physical exertion and pain), COPD (monitor RR and O2 sats with increased mobility), chronic anemia with pancytopenia (monitor labs, transfuse if necessary to ensure appropriate perfusion for increased activity tolerance), AKI (see CKD), Thrombocytopenia (< 60,000/mm3 no resistive exercise) 3. Due to bladder management, safety, skin/wound care, disease management and patient education, does the patient require 24 hr/day rehab nursing? Yes 4. Does the patient require coordinated care of a physician, rehab nurse, PT (1-2 hrs/day, 5 days/week) and OT (1-2 hrs/day, 5 days/week) to address physical and functional deficits in the context of the above medical diagnosis(es)? Yes Addressing deficits in the following areas: balance, endurance, locomotion, strength, transferring, bathing, dressing, toileting, cognition and psychosocial support 5. Can the patient actively participate in an intensive therapy program of at least 3 hrs of therapy per day at least 5 days per  week? Potentially 6. The potential for patient to make measurable gains while on inpatient rehab is excellent 7. Anticipated functional outcomes upon discharge from inpatient rehab are supervision and min assist  with PT, supervision and min assist with OT, supervision with SLP. 8. Estimated rehab length of stay to reach the above functional goals is: 16-20 days. 9. Anticipated D/C setting: Home 10. Anticipated post D/C treatments: HH therapy and Home excercise program 11. Overall Rehab/Functional Prognosis: good  RECOMMENDATIONS: This patient's condition is appropriate for continued rehabilitative care in the following setting: Likely CIR soon if patient to demonstrate ability to tolerate 3 hours of therapy/day. Patient has agreed to participate in recommended program. Yes Note that insurance prior authorization may be required for reimbursement for recommended care.  Comment: Rehab Admissions Coordinator to follow up.  Delice Lesch, MD, ABPMR Lavon Paganini Angiulli, PA-C 11/28/2017

## 2017-11-29 NOTE — Progress Notes (Signed)
[facility] Progress Note  Subjective: creat trending down!! 1300 UOP again. Rhythm irreg and controlled today, No c/o, denies any SOB.  son at bedside - pt a little more alert, but not that talkative- took his IV out   Vitals:   11/28/17 2055 11/29/17 0314 11/29/17 0518 11/29/17 0844  BP: (!) 131/57  (!) 166/65 (!) 166/60  Pulse: 62  65 70  Resp: 18 18 18 18   Temp: 98.4 F (36.9 C)  98.4 F (36.9 C) 98.9 F (37.2 C)  TempSrc: Oral  Oral Oral  SpO2: 93% 94% 95% 92%  Weight: 70.8 kg (156 lb 1.4 oz)     Height:        Inpatient medications: . Carbidopa-Levodopa ER  0.5 tablet Oral BID  . docusate sodium  100 mg Oral Daily  . donepezil  5 mg Oral QHS  . guaiFENesin  600 mg Oral BID  . mouth rinse  15 mL Mouth Rinse BID  . prednisoLONE acetate  1 drop Both Eyes BID    albuterol, ondansetron (ZOFRAN) IV, oxyCODONE **OR** oxyCODONE  Exam: Gen elderly HOH male, no distress, in chair No jvd or bruits Chest minimal crackles R base, L clear RRR no MRG  Abd soft ntnd no mass or ascites +bs  GU normal male w condom cath in place defer MS no joint effusions or deformity Ext 1+ dependent edema Neuro is sleepy but easily awakens, NF, ox 2  No UA CXR 12/01 > Cardiomegaly with bilateral perihilar and lower lobe opacities, likely edema/ CHF. Suspect small effusions.  CXR 12/02 - improved CHF  Inpt meds: mucomyst, sinemet CR, colace, aricept, fentanyl, lasix IV, hydralazine 25 mg po tid, HTCZ 12.5 mg /d, labealol 100 bid, versed, lyrica, albuterol, versed,    Impression: 1. Acute kidney injury - due to contrast nephropathy most likely. Finally seeing crt trend in right direction. Making urine.   renal US no hydro.  Contrast injury usually will recover. DC'd lyrica for now as need to avoid sedating meds w/ potential for uremia in next 48 hrs.  2. CKD baseline creat 2.1- 2.5, stage IV ckd.  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 Will make recovery slower.   Followed as OP by Dr. Dimas Aguas  3. Vol / pulm edema/ dyspnea - improved, CXR better. Still has edema but w AKI, holding further lasix.  Last dose 12/1 am.   4. Bradycardia - afib on EKG,   Have dc'd the low dose labetalol. HR Improved.  BP overall high- could resume his home hydralazine  5. Anemia - mesenteric angiogram did not show any signs of endoleak, this was reason for admission. Will check iron stores and give ESA- iron stores low, will replete 6. HTN - low normal, meds d/cd.  Now thinking to resume low dose hydralazine 7. Dementia/ Parkinson's- complicating issue but daughter says was living independantly.  From my standpoint would be OK to discharge to inpatient rehab       [place] A  11/29/2017, 10:06 AM   Recent Labs  Lab 11/27/17 0410 11/28/17 0543 11/29/17 0429  NA 137 135 138  K 3.9 4.3 4.1  CL 102 101 99*  CO2 28 25 28   GLUCOSE 114* 108* 109*  BUN 52* 60* 59*  CREATININE 3.25* 3.33* 3.02*  CALCIUM 8.4* 8.1* 8.5*   Recent Labs  Lab 11/23/17 1953  AST 19  ALT 7*  ALKPHOS 46  BILITOT 0.6  PROT 5.7*  ALBUMIN 3.5   Recent Labs  Lab 11/25/17 0501 11/26/17 0523 11/29/17 0429  WBC 4.7 3.6* 3.3*  HGB 8.8* 8.7* 9.2*  HCT 28.0* 27.5* 28.1*  MCV 96.9 97.2 95.6  PLT 82* 74* 96*   Iron/TIBC/Ferritin/ %Sat    Component Value Date/Time   IRON 13 (L) 11/29/2017 0429   TIBC 189 (L) 11/29/2017 0429   FERRITIN 207 11/29/2017 0429   IRONPCTSAT 7 (L) 11/29/2017 9326

## 2017-11-29 NOTE — Progress Notes (Signed)
PMR Pre-admission  Signed  Date of Service:  11/28/2017 7:25 PM       Related encounter: Admission (Current) from 11/23/2017 in [place]           [] Hide copied text  [] Hover for details   PMR Admission Coordinator Pre-Admission Assessment  Patient: [name] is an 83 y.o., male MRN: [id number] DOB: July 29, 1927 Height: 5\' 8"  (172.7 cm) Weight: 70.8 kg (156 lb 1.4 oz)                                                                                                                                                  Insurance Information HMO:     PPO:      PCP:      IPA:      80/20: yes     OTHER: no HMO PRIMARY: Medicare a and b       Policy#: [id number]      Subscriber: pt CM Name:       Phone#:      Fax#:  Pre-Cert#:       Employer:  Benefits:  Phone #: passport one     Name: 11/28/2017 Eff. Date: 02/24/94     Deduct: $1340      Out of Pocket Max: none      Life Max: none CIR: 100%      SNF: 20 full days Outpatient: 80%     Co-Pay: 20% Home Health: 100%      Co-Pay: none DME: 80%     Co-Pay: 20% Providers: pt choice  SECONDARY: BCBS supplement      Policy#: [id number]      Subscriber: pt  Medicaid Application Date:       Case Manager:  Disability Application Date:       Case Worker:   Emergency Contact Information        Contact Information    Name Relation Home Work Vance M Daughter  [phone]   Kemper Durie Relative   [phone]     Current Medical History  Patient Admitting Diagnosis:  Debility  History of Present Illness:  [name] O Malloyis a 83 y.o.right handed malewith history significant for CKDstageIV, dementia, parkinsonian maintained on Sinemet, AAA with prior endovascular repair and angiogram 2009, renal cell carcinoma solitary left kidney, hypertension, COPD, chronic anemia with pancytopenia.Presented 11/23/2017 after seeing his nephrologist for possible  AAA endoleak repair by interventional radiology. 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 Post procedure mildly hypoxic. Chest x-ray showed right lower lobe consolidation versus atelectasis. Patient remained on oxygen therapy and follow-up her pulmonary services. Patient recovered nicely and pulmonary services signed off 11/28/2017. Renal services follow-up for CKDstage III with elevated creatinine. Renal ultrasound  unremarkable left kidney. Latest creatinine 3.02with expected slow recovery.On 11/28/2017 cardiology services consulted for new onset atrial fibrillation with slow ventricular response felt to be exacerbated by his current medical illness. Patient had no chest pain or shortness of breath. Echocardiogram completed 11/28/2017 showing ejection fraction of 60% no wall motion abnormalities. Patient not felt to be a Coumadin candidate due to fall risk as well with noted history of pancytopenia  Past Medical History      Past Medical History:  Diagnosis Date  . Abdominal aortic aneurysm ([place]) 09/2008   s/o endovascular repair and angiogram 10/04/08   . Anemia    EGD Cscope 08-2012  . Arthritis    "hips" (11/23/2017)  . Atrial fibrillation ([place])   . Bleeding stomach ulcer 1990s  . Bleeding ulcer 1997  . CKD (chronic kidney disease), stage IV ([facility])    Stable, Dr. Dimas Aguas  . COPD (chronic obstructive pulmonary disease) ([place])    "mild" (11/23/2017)  . Dementia    "on very small dose of Aricept" (11/23/2017)  . H/O hyperkalemia    Stable  . Hypertension   . Leukopenia 2/11   thought to be from doxy  . Pneumonia 2018 X 2  . Renal cell carcinoma    clear cell type dx 10/09, s/p excision 10-24-08  . Renal insufficiency   . Solitary left kidney    W/ chronic tubule-interstitial damage, Dr. Dimas Aguas  . Spinal stenosis    causing LE weakness-persistant, being  worked up by Hydrographic surveyor and neurologist    Family History  family history includes Diabetes in his father; Hypertension in his father; Stroke in his father.  Prior Rehab/Hospitalizations:  Has the patient had major surgery during 100 days prior to admission? No  Current Medications   Current Facility-Administered Medications:  .  albuterol (PROVENTIL) (2.5 MG/3ML) 0.083% nebulizer solution 2.5 mg, 2.5 mg, Nebulization, Q4H PRN, Bonnielee Haff, MD, 2.5 mg at 11/29/17 0313 .  Carbidopa-Levodopa ER (SINEMET CR) 25-100 MG tablet controlled release 0.5 tablet, 0.5 tablet, Oral, BID, Hall, Carole N, DO, 0.5 tablet at 11/29/17 0826 .  docusate sodium (COLACE) capsule 100 mg, 100 mg, Oral, Daily, Irene Pap N, DO, 100 mg at 11/29/17 8657 .  donepezil (ARICEPT) tablet 5 mg, 5 mg, Oral, QHS, Hall, Carole N, DO, 5 mg at 11/28/17 2203 .  ferric gluconate (NULECIT) 125 mg in sodium chloride 0.9 % 100 mL IVPB, 125 mg, Intravenous, Daily, Corliss Parish, MD .  guaiFENesin (MUCINEX) 12 hr tablet 600 mg, 600 mg, Oral, BID, Bonnielee Haff, MD, 600 mg at 11/29/17 8469 .  hydrALAZINE (APRESOLINE) tablet 10 mg, 10 mg, Oral, Q8H, Corliss Parish, MD .  MEDLINE mouth rinse, 15 mL, Mouth Rinse, BID, Bonnielee Haff, MD, 15 mL at 11/29/17 0827 .  ondansetron (ZOFRAN) injection 4 mg, 4 mg, Intravenous, Q6H PRN, Hodierne, Adam, MD .  oxyCODONE (Oxy IR/ROXICODONE) immediate release tablet 5 mg, 5 mg, Oral, Once PRN **OR** oxyCODONE (ROXICODONE) 5 MG/5ML solution 5 mg, 5 mg, Oral, Once PRN, Hodierne, Adam, MD .  prednisoLONE acetate (PRED FORTE) 1 % ophthalmic suspension 1 drop, 1 drop, Both Eyes, BID, Hall, Carole N, DO, 1 drop at 11/29/17 0827  Patients Current Diet: Diet renal with fluid restriction Fluid restriction: 1200 mL Fluid; Room service appropriate? Yes; Fluid consistency: Thin  Precautions / Restrictions Precautions Precautions: Fall Restrictions Weight Bearing Restrictions:  No   Has the patient had 2 or more falls or a fall with injury in the past year?No  Prior  Activity Level Community (5-7x/wk): independent pta; Out pt therpay twice weekly; did not drive  Home Assistive Devices / Equipment Home Assistive Devices/Equipment: Eyeglasses, Dentures (specify type), Hearing aid, Grab bars in shower, Grab bars around toilet, Hand-held shower hose, Shower chair without back Home Equipment: Environmental consultant - 2 wheels, Cane - single point, Bedside commode, Shower seat  Prior Device Use: Indicate devices/aids used by the patient prior to current illness, exacerbation or injury? None of the above  Prior Functional Level Prior Function Level of Independence: Independent Comments: pt's daughter drives him wherever he needs to go. Pt was receiving OP therapy services 2x/week  Self Care: Did the patient need help bathing, dressing, using the toilet or eating?  Independent  Indoor Mobility: Did the patient need assistance with walking from room to room (with or without device)? Independent  Stairs: Did the patient need assistance with internal or external stairs (with or without device)? Independent  Functional Cognition: Did the patient need help planning regular tasks such as shopping or remembering to take medications? Needed some help  Current Functional Level Cognition  Overall Cognitive Status: History of cognitive impairments - at baseline Orientation Level: Oriented to person, Oriented to place, Oriented to situation General Comments: pt with dementia at baseline    Extremity Assessment (includes Sensation/Coordination)  Upper Extremity Assessment: Generalized weakness  Lower Extremity Assessment: Defer to PT evaluation    ADLs  Overall ADL's : Needs assistance/impaired Eating/Feeding: Set up, Sitting Grooming: Set up, Supervision/safety, Sitting, Wash/dry face, Wash/dry hands Upper Body Bathing: Min guard, Sitting Lower Body Bathing: Minimal  assistance, Sitting/lateral leans Upper Body Dressing : Min guard, Sitting Lower Body Dressing: Moderate assistance, +2 for physical assistance, +2 for safety/equipment, Sit to/from stand Lower Body Dressing Details (indicate cue type and reason): Pt able to reach and adjust socks sitting in recliner  Toilet Transfer: +2 for safety/equipment, +2 for physical assistance, RW, Moderate assistance, Ambulation, Stand-pivot Toilet Transfer Details (indicate cue type and reason): simulated in transfer to [place] and Hygiene: +2 for physical assistance, +2 for safety/equipment, Sit to/from stand, Moderate assistance Functional mobility during ADLs: +2 for physical assistance, +2 for safety/equipment, Rolling walker, Moderate assistance General ADL Comments: Pt with B knee buckling during transfer and ambulation requiring increased assist    Mobility  Overal bed mobility: Needs Assistance Bed Mobility: Supine to Sit Supine to sit: Min assist General bed mobility comments: pt up in recliner with PT upon arrival    Transfers  Overall transfer level: Needs assistance Equipment used: Rolling walker (2 wheeled) Transfers: Sit to/from Stand, [employer] Transfers Sit to Stand: Mod assist, +2 safety/equipment Stand pivot transfers: Max assist, +2 physical assistance, +2 safety/equipment General transfer comment: Moderate physical assist to power up to stand x3, initial posterior bias noted, Multi modal cues for posture and positioning. With cues patient able to correct to upright and bring weight forward to improve positioning with RW    Ambulation / Gait / Stairs / Wheelchair Mobility  Ambulation/Gait Ambulation/Gait assistance: Mod assist, +2 physical assistance Ambulation Distance (Feet): 40 Feet(40 x3) Assistive device: Rolling walker (2 wheeled) Gait Pattern/deviations: Step-to pattern, Trunk flexed, Narrow base of support, Festinating, Decreased stride  length General Gait Details: Patient with very festinating gait and bilateral LEs buckling initially, VCs for increased gait speed and stide, cued to over exagerate steps with marked improvement in patients ability Gait velocity: decreased    Posture / Balance Dynamic Sitting Balance Sitting balance - Comments: able to sit edge  of chair and maintain  Balance Overall balance assessment: Needs assistance Sitting-balance support: Feet supported Sitting balance-Leahy Scale: Fair Sitting balance - Comments: able to sit edge of chair and maintain  Standing balance support: During functional activity, Bilateral upper extremity supported Standing balance-Leahy Scale: Poor Standing balance comment: required bilateral UE supports and mod-max A    Special needs/care consideration BiPAP/CPAP  N/a CPM  N/a Continuous Drip IV  N/a Dialysis n/a Life Vest  N/a Oxygen  N/a Special Bed  High low bed in use on acute Trach Size Wound Vac n/a Skin ecchymosis to BUE Bowel mgmt: LBM 11/29/2017 Bladder mgmt: continent Diabetic mgmt n/a Daughter requests that no meds be begun or discontinued without her knowledge for she keeps up with allo f his co morbidities     Previous Home Environment Living Arrangements: Alone  Lives With: Alone Available Help at Discharge: (daughter and son in law can provide 24/7 assist; they are ve) Type of Home: House Home Layout: One level Home Access: Stairs to enter [employer] of Steps: 1 Bathroom Shower/Tub: Multimedia programmer: Standard Bathroom Accessibility: Yes How Accessible: Accessible via walker Home Care Services: No  Discharge Living Setting Plans for Discharge Living Setting: Patient's home, Alone Type of Home at Discharge: House Discharge Home Layout: One level Discharge Home Access: Stairs to enter Entrance Stairs-Rails: None Entrance Stairs-Number of Steps: 1 Discharge Bathroom Toilet: Standard Discharge Bathroom  Accessibility: Yes Does the patient have any problems obtaining your medications?: No  Social/Family/Support Systems Patient Roles: Parent Contact Information: [name], daughter Anticipated Caregiver: daughter and son in law Anticipated Caregiver's Contact Information: see above Ability/Limitations of Caregiver: daughter and son in law work, but will arrange schedule to provide whatever care is recommended Caregiver Availability: 24/7 Discharge Plan Discussed with Primary Caregiver: Yes Is Caregiver In Agreement with Plan?: Yes Does Caregiver/Family have Issues with Lodging/Transportation while Pt is in Rehab?: No   Goals/Additional Needs Patient/Family Goal for Rehab: supervision to min assist with PT and OT, supervision with SLP Expected length of stay: [facility] 16-20 days Special Service Needs: Daughter requests no new meds are started or meds disconitnued without her knowledge(Daughter manages all his comorbidities and manages his meds) Pt/Family Agrees to Admission and willing to participate: Yes Program Orientation Provided & Reviewed with Pt/Caregiver Including Roles  & Responsibilities: Yes   Patient has a follow up appointment with his hematologist on 12/13/17 so daughter would like him to be discharged before then if it is feasible. She is then going to [place] on 12/19 until 12/18/17 with her brother, Jaeveon, to be pt's caregiver at that time.   Family wants to be sure his creatinine be monitored closely on CIR. Decrease burden of Care through IP rehab admission: n/a  Possible need for SNF placement upon discharge:not anticipated. [name] and her spouse cared for her Mother for 5 years as she was bedridden. She died 2 years ago. They are very hands on  And involved in patient's care.  Patient Condition: This patient's medical and functional status has changed since the consult dated 11/28/2017 in which the Rehabilitation Physician determined and documented that the patient was  potentially appropriate for intensive rehabilitative care in an inpatient rehabilitation facility. Issues have been addressed and update has been discussed with Dr. Naaman Plummer and patient now appropriate for inpatient rehabilitation. Patient has demonstrated the ability to tolerate the intensity of an inpatient acute rehabilitation admission. Will admit to inpatient rehab today.   Preadmission Screen Completed By:  Cleatrice Burke,  11/29/2017 1:16 PM ______________________________________________________________________   Discussed status with Dr. Naaman Plummer on 11/29/2017 at  37  and received telephone approval for admission today.  Admission Coordinator:  Cleatrice Burke, time 0037 Date 11/29/2017             Cosigned by: Meredith Staggers, MD at 11/29/2017 1:43 PM

## 2017-11-29 NOTE — Progress Notes (Signed)
I have an inpt rehab bed to admit pt to today. I contacted Dr. [name] to arrange. Son in law at bedside and is aware and in agreement. I will make the arrangements to admit today. SW made aware. [phone]

## 2017-11-29 NOTE — Care Management Important Message (Signed)
Important Message  Patient Details  Name: [name] MRN: [id number] Date of Birth: 09/06/1927   Medicare Important Message Given:  Yes    [name] 11/29/2017, 12:05 PM

## 2017-11-29 NOTE — Discharge Summary (Signed)
[facility]  Physician Discharge Summary   Patient ID: [name] MRN: [id number] DOB/AGE: October 15, 1927 83 y.o.  Admit date: 11/23/2017 Discharge date: 11/29/2017  PCP: Colon Branch, MD  DISCHARGE DIAGNOSES:  Active Problems:   Pressure injury of skin   Leaking abdominal aortic aneurysm (AAA) (HCC)   RECOMMENDATIONS FOR OUTPATIENT FOLLOW UP: 1. Recommend getting CBC and basic metabolic panel every few days. 2. Patient has an appointment with his hematologist on 12/18, Dr. Cruzita Lederer.  We strongly recommend that he keep this appointment if possible to discuss anticoagulation for atrial fibrillation.  DISCHARGE CONDITION: fair  Diet recommendation: Renal diet with fluid restriction of 1500 mL/day  Filed Weights   11/27/17 0500 11/27/17 2223 11/28/17 2055  Weight: 70.6 kg (155 lb 10.3 oz) 70.8 kg (156 lb 1.4 oz) 70.8 kg (156 lb 1.4 oz)    INITIAL HISTORY: 83 year old Caucasian male with a past medical history of AAA with prior endovascular repair, history of renal cell carcinoma with solitary left kidney, history of hypertension, COPD was sent over from his nephrologist for evaluation of possible AAA endoleak.  Patient was referred to interventional radiology who recommended admission to the hospital.  Patient subsequently developed hypoxia.  Imaging studies suggested interstitial edema and left-sided mucous plugging.  Pulmonology and nephrology consulted.  Consultants: Interventional radiology.  Pulmonology.  Nephrology.  Cardiology  Procedures:   Selective angiography which did not reveal any AAA endoleak.  Transthoracic echocardiogram Study Conclusions  - Left ventricle: The cavity size was normal. There was severe   concentric hypertrophy. Systolic function was normal. The   estimated ejection fraction was in the range of 55% to 60%. Wall   motion was normal; there were no regional wall motion   abnormalities. - Aortic valve: Transvalvular velocity was within  the normal range.   There was no stenosis. There was mild regurgitation. - Mitral valve: Transvalvular velocity was within the normal range.   There was no evidence for stenosis. There was mild regurgitation. - Left atrium: The atrium was severely dilated. - Right ventricle: The cavity size was normal. Wall thickness was   normal. Systolic function was normal. - Tricuspid valve: There was mild regurgitation. - Pulmonary arteries: Systolic pressure was mildly increased. PA   peak pressure: 46 mm Hg (S). - Pericardium, extracardiac: A trivial pericardial effusion was   identified.   HOSPITAL COURSE:   Acute respiratory failure with hypoxia/atelectasis/interstitial edema Patient developed hypoxia around the time of his procedure on 11/29.  His saturations were in the mid 80s.    This was due to fluid overload since he had received IV fluids prior to procedure in order to try and protect his kidneys from contrast.  He was given Lasix.  However patient did not improve much.  X-ray was repeated and showed evidence for atelectasis versus mucous plug on the left lung.  Pulmonology was consulted.  He was given chest physiotherapy along with a flutter valve and incentive spirometry.  He was also given Mucomyst nebulizer treatments.  He improved with this.  His x-ray shows improved aeration on the left.  With Lasix he has diuresed.  Since he had worsening of his renal function Lasix was held.  He is making reasonable amounts of urine.  He is now on room air and saturating 90-95%.  Incentive spirometry and flutter valve to be continued at the inpatient rehab.    Acute on chronic kidney disease stage IV Baseline creatinine around 2.4-2.8.  Patient given high-dose Lasix 11/29.  Lasix was held on 11/30.  Patient did not have much urine output on 11/30-12/1.  He was then given 60 mg of IV Lasix.  His urine output improved.  Creatinine climbed which could be due to contrast.  Nephrology was consulted.    Lasix  was held.  Patient was making urine on his own.  Creatinine peaked at around 3.3 and now is down to 3.0.  This will need to be monitored at rehab.  Followed by nephrologist in [facility], Dr. Dimas Aguas.    Atrial fibrillation, new onset Appears to be a new diagnosis.  Noted to be bradycardic.  However he is asymptomatic.  No previous history of same per daughter.  Cardiology was consulted.  TSH is normal.  Echocardiogram is as above.  Normal systolic function is noted.  No significant valvular abnormalities.  Labetalol was discontinued since his heart rate was in the 40s.  Seen by cardiology.  His rate is already controlled.  Anticoagulation was discussed in detail with cardiology.  They also discussed with family.  Due to his multiple comorbidities along with hematological issues causing pancytopenia it is felt that it would be prudent to obtain input from his hematologist before starting the patient on any kind of anticoagulation due to risk of bleeding.  He has an appointment with his hematologist, Dr. Cruzita Lederer, on 12/18.    History of AAA with concern for endoleak Seen by interventional radiology and underwent arteriography.  Did not show any endoleak which is reassuring.    History of essential hypertension Blood pressure has been elevated over the last 24 hours.  Started on hydralazine by nephrology.    History of renal cell carcinoma with solitary kidney Stable  Anemia likely due to chronic kidney disease Stable.  No evidence for overt bleeding.  Pancytopenia thought to be due to myelodysplastic syndrome versus autoimmune cytopenias All his counts are low based on previous labs.  He is followed by hematology in [facility]. Previous labs reviewed and he has chronic thrombocytopenia.    History of Parkinson's disease Continue home medications.    Overall stable.  Patient was seen by physical therapy.  Inpatient rehabilitation was recommended.  He has been approved for inpatient  rehabilitation.  Discussed with nephrology as well as cardiology.  Okay for discharge today.   PERTINENT LABS:  The results of significant diagnostics from this hospitalization (including imaging, microbiology, ancillary and laboratory) are listed below for reference.      Labs: Basic Metabolic Panel: Recent Labs  Lab 11/25/17 0501 11/26/17 0523 11/27/17 0410 11/28/17 0543 11/29/17 0429  NA 140 137 137 135 138  K 3.8 3.7 3.9 4.3 4.1  CL 108 103 102 101 99*  CO2 25 24 28 25 28   GLUCOSE 103* 86 114* 108* 109*  BUN 37* 42* 52* 60* 59*  CREATININE 2.67* 2.81* 3.25* 3.33* 3.02*  CALCIUM 8.3* 8.2* 8.4* 8.1* 8.5*   Liver Function Tests: Recent Labs  Lab 11/23/17 1953  AST 19  ALT 7*  ALKPHOS 46  BILITOT 0.6  PROT 5.7*  ALBUMIN 3.5   CBC: Recent Labs  Lab 11/23/17 1953 11/24/17 0623 11/25/17 0501 11/26/17 0523 11/29/17 0429  WBC 3.2* 2.4* 4.7 3.6* 3.3*  HGB 9.2* 8.7* 8.8* 8.7* 9.2*  HCT 28.3* 27.5* 28.0* 27.5* 28.1*  MCV 95.3 97.9 96.9 97.2 95.6  PLT 80* 80* 82* 74* 96*     IMAGING STUDIES Dg Chest 2 View  Result Date: 11/25/2017 CLINICAL DATA:  Cough, shortness of breath. EXAM:  CHEST  2 VIEW COMPARISON:  Radiograph of November 24, 2017. FINDINGS: Stable cardiomegaly. Atherosclerosis of thoracic aorta is noted. Significantly increased opacity of the left lung is noted most consistent with atelectasis and associated pleural effusion. Significantly increased right to left midline shift is noted most consistent with volume loss. Hyperexpansion of the right lung is noted with mild right basilar atelectasis. No pneumothorax is noted. Bony thorax is unremarkable. IMPRESSION: Significant right to left mediastinal shift is noted most likely due to atelectasis and volume loss in the left hemithorax, with possible associated pleural effusion. Mild right basilar subsegmental atelectasis is noted. Electronically Signed   By: Marijo Conception, M.D.   On: 11/25/2017 10:21   Ir  Angiogram Visceral Selective  Result Date: 11/24/2017 INDICATION: 83 year old male with an enlarging infrarenal abdominal aortic aneurysm despite prior endovascular aortic repair. 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 Patient has significant chronic kidney disease and was admitted yesterday for pre-procedure hydration. He presents now for multi selective angiography and attempted endoleak repair. EXAM: SELECTIVE VISCERAL ARTERIOGRAPHY; ADDITIONAL ARTERIOGRAPHY; IR ULTRASOUND GUIDANCE VASC ACCESS RIGHT; PELVIC SELECTIVE ARTERIOGRAPHY MEDICATIONS: None ANESTHESIA/SEDATION: Moderate (conscious) sedation was employed during this procedure. A total of Versed 2 mg and Fentanyl 100 mcg was administered intravenously. Moderate Sedation Time: 115 minutes. The patient's level of consciousness and vital signs were monitored continuously by radiology nursing throughout the procedure under my direct supervision. CONTRAST:  46 mL Visipaque 320 FLUOROSCOPY TIME:  Fluoroscopy Time: 19 minutes 12 seconds (911 mGy). COMPLICATIONS: None immediate. PROCEDURE: Informed consent was obtained from the patient following explanation of the procedure, risks, benefits and alternatives. The patient understands, agrees and consents for the procedure. All questions were addressed. A time out was performed prior to the initiation of the procedure. Maximal barrier sterile technique utilized including caps, mask, sterile gowns, sterile gloves, large sterile drape, hand hygiene, and Betadine prep. The right common femoral artery was interrogated with ultrasound and found to be widely patent. An image was obtained and stored for the medical record. Local anesthesia was attained by infiltration with 1% lidocaine. A small dermatotomy was made. Under real-time sonographic guidance, the vessel was punctured with a 21 gauge micropuncture needle. Using  standard technique, the initial micro needle was exchanged over a 0.018 micro wire for a transitional 4 [place] micro sheath. The micro sheath was then exchanged over a 0.035 wire for a 5 French vascular sheath. A C2 cobra catheter was advanced of the abdominal aorta over a Bentson wire in used to select the celiac artery. A celiac arteriogram was performed. Conventional anatomy. No evidence of replaced middle colloid artery. The C2 catheter was next advanced into the superior mesenteric artery. A superior mesenteric arteriogram was performed using carbon dioxide gas. This resulted in suboptimal visualization. Therefore, arteriography was performed using Visipaque. The middle colloid artery arises proximally from the SMA. There is a visible marginal artery of [place] extending into the left colloid artery and then the IMA. No definite filling of the aneurysm sac. An echelon 14 microcatheter was then successfully navigated over a Fathom 14 wire into the middle colloid artery. Arteriography confirms the catheter position. The catheter was next navigated into the marginal artery of [place]. Arteriography again performed confirming catheter position and anatomy. The artery was then advanced into the left colloid artery. Arteriography again confirms catheter position and anatomy. Still no filling of the aneurysm sac. It is difficult to tell  if the IMA is occluded or if there is outflow from the aneurysm sac. Finally, after significant effort, the microcatheter was successfully advanced into the SMA. Arteriography was performed. The origin of the SMA is occluded. There is a thin channel leading to the aneurysm sac in an filling the Vasa basal rim. There is no endoleak arising from the IMA. The catheter was removed. The C2 cobra catheter was readvanced through the sheath and into the internal iliac artery. An internal iliac arteriogram was performed. The ascending ileo lumbar artery is successfully identified. No evidence  of right-sided lumbar artery endoleak. The C2 catheter was removed. A limited right common femoral arteriogram was performed confirming common femoral arterial access. Hemostasis was attained with the assistance of a 6 French Angio-Seal device. IMPRESSION: 1. 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 No evidence of type 2 endoleak arising from the IMA, or the right-sided lumbar arteries. Signed, Criselda Peaches, MD [facility] Radiology Electronically Signed   By: Jacqulynn Cadet M.D.   On: 11/24/2017 14:29   Ir Angiogram Visceral Selective  Result Date: 11/24/2017 INDICATION: 83 year old male with an enlarging infrarenal abdominal aortic aneurysm despite prior endovascular aortic repair. 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 Patient has significant chronic kidney disease and was admitted yesterday for pre-procedure hydration. He presents now for multi selective angiography and attempted endoleak repair. EXAM: SELECTIVE VISCERAL ARTERIOGRAPHY; ADDITIONAL ARTERIOGRAPHY; IR ULTRASOUND GUIDANCE VASC ACCESS RIGHT; PELVIC SELECTIVE ARTERIOGRAPHY MEDICATIONS: None ANESTHESIA/SEDATION: Moderate (conscious) sedation was employed during this procedure. A total of Versed 2 mg and Fentanyl 100 mcg was administered intravenously. Moderate Sedation Time: 115 minutes. The patient's level of consciousness and vital signs were monitored continuously by radiology nursing throughout the procedure under my direct supervision. CONTRAST:  46 mL Visipaque 320 FLUOROSCOPY TIME:  Fluoroscopy Time: 19 minutes 12 seconds (911 mGy). COMPLICATIONS: None immediate. PROCEDURE: Informed consent was obtained from the patient following explanation of the procedure, risks, benefits and alternatives. The patient understands,  agrees and consents for the procedure. All questions were addressed. A time out was performed prior to the initiation of the procedure. Maximal barrier sterile technique utilized including caps, mask, sterile gowns, sterile gloves, large sterile drape, hand hygiene, and Betadine prep. The right common femoral artery was interrogated with ultrasound and found to be widely patent. An image was obtained and stored for the medical record. Local anesthesia was attained by infiltration with 1% lidocaine. A small dermatotomy was made. Under real-time sonographic guidance, the vessel was punctured with a 21 gauge micropuncture needle. Using standard technique, the initial micro needle was exchanged over a 0.018 micro wire for a transitional 4 [place] micro sheath. The micro sheath was then exchanged over a 0.035 wire for a 5 French vascular sheath. A C2 cobra catheter was advanced of the abdominal aorta over a Bentson wire in used to select the celiac artery. A celiac arteriogram was performed. Conventional anatomy. No evidence of replaced middle colloid artery. The C2 catheter was next advanced into the superior mesenteric artery. A superior mesenteric arteriogram was performed using carbon dioxide gas. This resulted in suboptimal visualization. Therefore, arteriography was performed using Visipaque. The middle colloid artery arises proximally from the SMA. There is a visible marginal artery of [place] extending into the left colloid artery and then the IMA. No definite filling of the aneurysm sac. An echelon 29  microcatheter was then successfully navigated over a Fathom 14 wire into the middle colloid artery. Arteriography confirms the catheter position. The catheter was next navigated into the marginal artery of [place]. Arteriography again performed confirming catheter position and anatomy. The artery was then advanced into the left colloid artery. Arteriography again confirms catheter position and anatomy. Still no  filling of the aneurysm sac. It is difficult to tell if the IMA is occluded or if there is outflow from the aneurysm sac. Finally, after significant effort, the microcatheter was successfully advanced into the SMA. Arteriography was performed. The origin of the SMA is occluded. There is a thin channel leading to the aneurysm sac in an filling the Vasa basal rim. There is no endoleak arising from the IMA. The catheter was removed. The C2 cobra catheter was readvanced through the sheath and into the internal iliac artery. An internal iliac arteriogram was performed. The ascending ileo lumbar artery is successfully identified. No evidence of right-sided lumbar artery endoleak. The C2 catheter was removed. A limited right common femoral arteriogram was performed confirming common femoral arterial access. Hemostasis was attained with the assistance of a 6 French Angio-Seal device. IMPRESSION: 1. 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 No evidence of type 2 endoleak arising from the IMA, or the right-sided lumbar arteries. Signed, Criselda Peaches, MD [facility] Radiology Electronically Signed   By: Jacqulynn Cadet M.D.   On: 11/24/2017 14:29   Ir Angiogram Pelvis Selective Or Supraselective  Result Date: 11/24/2017 INDICATION: 83 year old male with an enlarging infrarenal abdominal aortic aneurysm despite prior endovascular aortic repair. 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 Patient has significant chronic kidney disease and was admitted yesterday for pre-procedure hydration. He presents now for multi selective angiography and attempted endoleak repair. EXAM: SELECTIVE VISCERAL ARTERIOGRAPHY; ADDITIONAL ARTERIOGRAPHY; IR ULTRASOUND GUIDANCE VASC ACCESS RIGHT; PELVIC SELECTIVE ARTERIOGRAPHY  MEDICATIONS: None ANESTHESIA/SEDATION: Moderate (conscious) sedation was employed during this procedure. A total of Versed 2 mg and Fentanyl 100 mcg was administered intravenously. Moderate Sedation Time: 115 minutes. The patient's level of consciousness and vital signs were monitored continuously by radiology nursing throughout the procedure under my direct supervision. CONTRAST:  46 mL Visipaque 320 FLUOROSCOPY TIME:  Fluoroscopy Time: 19 minutes 12 seconds (911 mGy). COMPLICATIONS: None immediate. PROCEDURE: Informed consent was obtained from the patient following explanation of the procedure, risks, benefits and alternatives. The patient understands, agrees and consents for the procedure. All questions were addressed. A time out was performed prior to the initiation of the procedure. Maximal barrier sterile technique utilized including caps, mask, sterile gowns, sterile gloves, large sterile drape, hand hygiene, and Betadine prep. The right common femoral artery was interrogated with ultrasound and found to be widely patent. An image was obtained and stored for the medical record. Local anesthesia was attained by infiltration with 1% lidocaine. A small dermatotomy was made. Under real-time sonographic guidance, the vessel was punctured with a 21 gauge micropuncture needle. Using standard technique, the initial micro needle was exchanged over a 0.018 micro wire for a transitional 4 [place] micro sheath. The micro sheath was then exchanged over a 0.035 wire for a 5 French vascular sheath. A C2 cobra catheter was advanced of the abdominal aorta over a Bentson wire in used to select the celiac artery. A celiac arteriogram was performed. Conventional anatomy. No evidence of replaced middle colloid  artery. The C2 catheter was next advanced into the superior mesenteric artery. A superior mesenteric arteriogram was performed using carbon dioxide gas. This resulted in suboptimal visualization. Therefore, arteriography was  performed using Visipaque. The middle colloid artery arises proximally from the SMA. There is a visible marginal artery of [place] extending into the left colloid artery and then the IMA. No definite filling of the aneurysm sac. An echelon 14 microcatheter was then successfully navigated over a Fathom 14 wire into the middle colloid artery. Arteriography confirms the catheter position. The catheter was next navigated into the marginal artery of Drum[place]. Arteriography again performed confirming catheter position and anatomy. The artery was then advanced into the left colloid artery. Arteriography again confirms catheter position and anatomy. Still no filling of the aneurysm sac. It is difficult to tell if the IMA is occluded or if there is outflow from the aneurysm sac. Finally, after significant effort, the microcatheter was successfully advanced into the SMA. Arteriography was performed. The origin of the SMA is occluded. There is a thin channel leading to the aneurysm sac in an filling the Vasa basal rim. There is no endoleak arising from the IMA. The catheter was removed. The C2 cobra catheter was readvanced through the sheath and into the internal iliac artery. An internal iliac arteriogram was performed. The ascending ileo lumbar artery is successfully identified. No evidence of right-sided lumbar artery endoleak. The C2 catheter was removed. A limited right common femoral arteriogram was performed confirming common femoral arterial access. Hemostasis was attained with the assistance of a 6 French Angio-Seal device. IMPRESSION: 1. 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 No evidence of type 2 endoleak arising from the IMA, or the right-sided lumbar arteries. Signed, Criselda Peaches, MD [facility] Radiology Electronically Signed   By: Jacqulynn Cadet M.D.   On: 11/24/2017 14:29   Ir  Angiogram Selective Each Additional Vessel  Result Date: 11/24/2017 INDICATION: 83 year old male with an enlarging infrarenal abdominal aortic aneurysm despite prior endovascular aortic repair. 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 Patient has significant chronic kidney disease and was admitted yesterday for pre-procedure hydration. He presents now for multi selective angiography and attempted endoleak repair. EXAM: SELECTIVE VISCERAL ARTERIOGRAPHY; ADDITIONAL ARTERIOGRAPHY; IR ULTRASOUND GUIDANCE VASC ACCESS RIGHT; PELVIC SELECTIVE ARTERIOGRAPHY MEDICATIONS: None ANESTHESIA/SEDATION: Moderate (conscious) sedation was employed during this procedure. A total of Versed 2 mg and Fentanyl 100 mcg was administered intravenously. Moderate Sedation Time: 115 minutes. The patient's level of consciousness and vital signs were monitored continuously by radiology nursing throughout the procedure under my direct supervision. CONTRAST:  46 mL Visipaque 320 FLUOROSCOPY TIME:  Fluoroscopy Time: 19 minutes 12 seconds (911 mGy). COMPLICATIONS: None immediate. PROCEDURE: Informed consent was obtained from the patient following explanation of the procedure, risks, benefits and alternatives. The patient understands, agrees and consents for the procedure. All questions were addressed. A time out was performed prior to the initiation of the procedure. Maximal barrier sterile technique utilized including caps, mask, sterile gowns, sterile gloves, large sterile drape, hand hygiene, and Betadine prep. The right common femoral artery was interrogated with ultrasound and found to be widely patent. An image was obtained and stored for the medical record. Local anesthesia was attained by infiltration with 1% lidocaine. A small dermatotomy was made. Under real-time sonographic guidance, the vessel was punctured with a 21 gauge micropuncture  needle.  Using standard technique, the initial micro needle was exchanged over a 0.018 micro wire for a transitional 4 [place] micro sheath. The micro sheath was then exchanged over a 0.035 wire for a 5 French vascular sheath. A C2 cobra catheter was advanced of the abdominal aorta over a Bentson wire in used to select the celiac artery. A celiac arteriogram was performed. Conventional anatomy. No evidence of replaced middle colloid artery. The C2 catheter was next advanced into the superior mesenteric artery. A superior mesenteric arteriogram was performed using carbon dioxide gas. This resulted in suboptimal visualization. Therefore, arteriography was performed using Visipaque. The middle colloid artery arises proximally from the SMA. There is a visible marginal artery of [place] extending into the left colloid artery and then the IMA. No definite filling of the aneurysm sac. An echelon 14 microcatheter was then successfully navigated over a Fathom 14 wire into the middle colloid artery. Arteriography confirms the catheter position. The catheter was next navigated into the marginal artery of [place]. Arteriography again performed confirming catheter position and anatomy. The artery was then advanced into the left colloid artery. Arteriography again confirms catheter position and anatomy. Still no filling of the aneurysm sac. It is difficult to tell if the IMA is occluded or if there is outflow from the aneurysm sac. Finally, after significant effort, the microcatheter was successfully advanced into the SMA. Arteriography was performed. The origin of the SMA is occluded. There is a thin channel leading to the aneurysm sac in an filling the Vasa basal rim. There is no endoleak arising from the IMA. The catheter was removed. The C2 cobra catheter was readvanced through the sheath and into the internal iliac artery. An internal iliac arteriogram was performed. The ascending ileo lumbar artery is successfully  identified. No evidence of right-sided lumbar artery endoleak. The C2 catheter was removed. A limited right common femoral arteriogram was performed confirming common femoral arterial access. Hemostasis was attained with the assistance of a 6 French Angio-Seal device. IMPRESSION: 1. 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 No evidence of type 2 endoleak arising from the IMA, or the right-sided lumbar arteries. Signed, Criselda Peaches, MD [facility] Radiology Electronically Signed   By: Jacqulynn Cadet M.D.   On: 11/24/2017 14:29   Ir Angiogram Selective Each Additional Vessel  Result Date: 11/24/2017 INDICATION: 83 year old male with an enlarging infrarenal abdominal aortic aneurysm despite prior endovascular aortic repair. 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 Patient has significant chronic kidney disease and was admitted yesterday for pre-procedure hydration. He presents now for multi selective angiography and attempted endoleak repair. EXAM: SELECTIVE VISCERAL ARTERIOGRAPHY; ADDITIONAL ARTERIOGRAPHY; IR ULTRASOUND GUIDANCE VASC ACCESS RIGHT; PELVIC SELECTIVE ARTERIOGRAPHY MEDICATIONS: None ANESTHESIA/SEDATION: Moderate (conscious) sedation was employed during this procedure. A total of Versed 2 mg and Fentanyl 100 mcg was administered intravenously. Moderate Sedation Time: 115 minutes. The patient's level of consciousness and vital signs were monitored continuously by radiology nursing throughout the procedure under my direct supervision. CONTRAST:  46 mL Visipaque 320 FLUOROSCOPY TIME:  Fluoroscopy Time: 19 minutes 12 seconds (911 mGy). COMPLICATIONS: None immediate. PROCEDURE: Informed consent was obtained from the patient following explanation of the procedure, risks, benefits and  alternatives. The patient understands, agrees and consents for the procedure. All questions were addressed. A time out was performed prior to the initiation of the procedure. Maximal barrier sterile  technique utilized including caps, mask, sterile gowns, sterile gloves, large sterile drape, hand hygiene, and Betadine prep. The right common femoral artery was interrogated with ultrasound and found to be widely patent. An image was obtained and stored for the medical record. Local anesthesia was attained by infiltration with 1% lidocaine. A small dermatotomy was made. Under real-time sonographic guidance, the vessel was punctured with a 21 gauge micropuncture needle. Using standard technique, the initial micro needle was exchanged over a 0.018 micro wire for a transitional 4 [place] micro sheath. The micro sheath was then exchanged over a 0.035 wire for a 5 French vascular sheath. A C2 cobra catheter was advanced of the abdominal aorta over a Bentson wire in used to select the celiac artery. A celiac arteriogram was performed. Conventional anatomy. No evidence of replaced middle colloid artery. The C2 catheter was next advanced into the superior mesenteric artery. A superior mesenteric arteriogram was performed using carbon dioxide gas. This resulted in suboptimal visualization. Therefore, arteriography was performed using Visipaque. The middle colloid artery arises proximally from the SMA. There is a visible marginal artery of [place] extending into the left colloid artery and then the IMA. No definite filling of the aneurysm sac. An echelon 14 microcatheter was then successfully navigated over a Fathom 14 wire into the middle colloid artery. Arteriography confirms the catheter position. The catheter was next navigated into the marginal artery of [place]. Arteriography again performed confirming catheter position and anatomy. The artery was then advanced into the left colloid artery. Arteriography again confirms  catheter position and anatomy. Still no filling of the aneurysm sac. It is difficult to tell if the IMA is occluded or if there is outflow from the aneurysm sac. Finally, after significant effort, the microcatheter was successfully advanced into the SMA. Arteriography was performed. The origin of the SMA is occluded. There is a thin channel leading to the aneurysm sac in an filling the Vasa basal rim. There is no endoleak arising from the IMA. The catheter was removed. The C2 cobra catheter was readvanced through the sheath and into the internal iliac artery. An internal iliac arteriogram was performed. The ascending ileo lumbar artery is successfully identified. No evidence of right-sided lumbar artery endoleak. The C2 catheter was removed. A limited right common femoral arteriogram was performed confirming common femoral arterial access. Hemostasis was attained with the assistance of a 6 French Angio-Seal device. IMPRESSION: 1. 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 No evidence of type 2 endoleak arising from the IMA, or the right-sided lumbar arteries. Signed, Criselda Peaches, MD [facility] Radiology Electronically Signed   By: Jacqulynn Cadet M.D.   On: 11/24/2017 14:29   Ir Angiogram Selective Each Additional Vessel  Result Date: 11/24/2017 INDICATION: 83 year old male with an enlarging infrarenal abdominal aortic aneurysm despite prior endovascular aortic repair. 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 Patient has significant chronic kidney disease and was admitted yesterday for pre-procedure hydration. He presents now for multi selective angiography and attempted endoleak repair. EXAM: SELECTIVE VISCERAL ARTERIOGRAPHY; ADDITIONAL ARTERIOGRAPHY; IR ULTRASOUND GUIDANCE VASC ACCESS RIGHT;  PELVIC SELECTIVE ARTERIOGRAPHY MEDICATIONS: None ANESTHESIA/SEDATION: Moderate (conscious) sedation was employed during this procedure. A total of Versed 2 mg and Fentanyl 100 mcg was administered intravenously. Moderate Sedation Time: 115 minutes. The patient's level of consciousness and vital signs were monitored continuously by radiology nursing throughout the  procedure under my direct supervision. CONTRAST:  46 mL Visipaque 320 FLUOROSCOPY TIME:  Fluoroscopy Time: 19 minutes 12 seconds (911 mGy). COMPLICATIONS: None immediate. PROCEDURE: Informed consent was obtained from the patient following explanation of the procedure, risks, benefits and alternatives. The patient understands, agrees and consents for the procedure. All questions were addressed. A time out was performed prior to the initiation of the procedure. Maximal barrier sterile technique utilized including caps, mask, sterile gowns, sterile gloves, large sterile drape, hand hygiene, and Betadine prep. The right common femoral artery was interrogated with ultrasound and found to be widely patent. An image was obtained and stored for the medical record. Local anesthesia was attained by infiltration with 1% lidocaine. A small dermatotomy was made. Under real-time sonographic guidance, the vessel was punctured with a 21 gauge micropuncture needle. Using standard technique, the initial micro needle was exchanged over a 0.018 micro wire for a transitional 4 [place] micro sheath. The micro sheath was then exchanged over a 0.035 wire for a 5 French vascular sheath. A C2 cobra catheter was advanced of the abdominal aorta over a Bentson wire in used to select the celiac artery. A celiac arteriogram was performed. Conventional anatomy. No evidence of replaced middle colloid artery. The C2 catheter was next advanced into the superior mesenteric artery. A superior mesenteric arteriogram was performed using carbon dioxide gas. This resulted in suboptimal  visualization. Therefore, arteriography was performed using Visipaque. The middle colloid artery arises proximally from the SMA. There is a visible marginal artery of [place] extending into the left colloid artery and then the IMA. No definite filling of the aneurysm sac. An echelon 14 microcatheter was then successfully navigated over a Fathom 14 wire into the middle colloid artery. Arteriography confirms the catheter position. The catheter was next navigated into the marginal artery of [place]. Arteriography again performed confirming catheter position and anatomy. The artery was then advanced into the left colloid artery. Arteriography again confirms catheter position and anatomy. Still no filling of the aneurysm sac. It is difficult to tell if the IMA is occluded or if there is outflow from the aneurysm sac. Finally, after significant effort, the microcatheter was successfully advanced into the SMA. Arteriography was performed. The origin of the SMA is occluded. There is a thin channel leading to the aneurysm sac in an filling the Vasa basal rim. There is no endoleak arising from the IMA. The catheter was removed. The C2 cobra catheter was readvanced through the sheath and into the internal iliac artery. An internal iliac arteriogram was performed. The ascending ileo lumbar artery is successfully identified. No evidence of right-sided lumbar artery endoleak. The C2 catheter was removed. A limited right common femoral arteriogram was performed confirming common femoral arterial access. Hemostasis was attained with the assistance of a 6 French Angio-Seal device. IMPRESSION: 1. 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 No evidence of type 2 endoleak arising from the IMA, or the right-sided lumbar arteries. Signed, Criselda Peaches, MD [facility] Radiology Electronically Signed   By: Jacqulynn Cadet M.D.   On: 11/24/2017 14:29   Ir Angiogram Selective Each Additional Vessel  Result Date: 11/24/2017 INDICATION: 83 year old male with an enlarging infrarenal abdominal aortic aneurysm despite prior endovascular aortic repair. 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 Patient has significant chronic kidney disease and was admitted yesterday for pre-procedure hydration. He  presents now for multi selective angiography and attempted endoleak repair. EXAM: SELECTIVE VISCERAL ARTERIOGRAPHY; ADDITIONAL ARTERIOGRAPHY; IR ULTRASOUND GUIDANCE VASC ACCESS RIGHT; PELVIC SELECTIVE ARTERIOGRAPHY MEDICATIONS: None ANESTHESIA/SEDATION: Moderate (conscious) sedation was employed during this procedure. A total of Versed 2 mg and Fentanyl 100 mcg was administered intravenously. Moderate Sedation Time: 115 minutes. The patient's level of consciousness and vital signs were monitored continuously by radiology nursing throughout the procedure under my direct supervision. CONTRAST:  46 mL Visipaque 320 FLUOROSCOPY TIME:  Fluoroscopy Time: 19 minutes 12 seconds (911 mGy). COMPLICATIONS: None immediate. PROCEDURE: Informed consent was obtained from the patient following explanation of the procedure, risks, benefits and alternatives. The patient understands, agrees and consents for the procedure. All questions were addressed. A time out was performed prior to the initiation of the procedure. Maximal barrier sterile technique utilized including caps, mask, sterile gowns, sterile gloves, large sterile drape, hand hygiene, and Betadine prep. The right common femoral artery was interrogated with ultrasound and found to be widely patent. An image was obtained and stored for the medical record. Local anesthesia was attained by infiltration with 1% lidocaine. A small dermatotomy was made. Under real-time sonographic guidance, the vessel  was punctured with a 21 gauge micropuncture needle. Using standard technique, the initial micro needle was exchanged over a 0.018 micro wire for a transitional 4 [place] micro sheath. The micro sheath was then exchanged over a 0.035 wire for a 5 French vascular sheath. A C2 cobra catheter was advanced of the abdominal aorta over a Bentson wire in used to select the celiac artery. A celiac arteriogram was performed. Conventional anatomy. No evidence of replaced middle colloid artery. The C2 catheter was next advanced into the superior mesenteric artery. A superior mesenteric arteriogram was performed using carbon dioxide gas. This resulted in suboptimal visualization. Therefore, arteriography was performed using Visipaque. The middle colloid artery arises proximally from the SMA. There is a visible marginal artery of [place] extending into the left colloid artery and then the IMA. No definite filling of the aneurysm sac. An echelon 14 microcatheter was then successfully navigated over a Fathom 14 wire into the middle colloid artery. Arteriography confirms the catheter position. The catheter was next navigated into the marginal artery of [place]. Arteriography again performed confirming catheter position and anatomy. The artery was then advanced into the left colloid artery. Arteriography again confirms catheter position and anatomy. Still no filling of the aneurysm sac. It is difficult to tell if the IMA is occluded or if there is outflow from the aneurysm sac. Finally, after significant effort, the microcatheter was successfully advanced into the SMA. Arteriography was performed. The origin of the SMA is occluded. There is a thin channel leading to the aneurysm sac in an filling the Vasa basal rim. There is no endoleak arising from the IMA. The catheter was removed. The C2 cobra catheter was readvanced through the sheath and into the internal iliac artery. An internal iliac arteriogram was performed. The ascending  ileo lumbar artery is successfully identified. No evidence of right-sided lumbar artery endoleak. The C2 catheter was removed. A limited right common femoral arteriogram was performed confirming common femoral arterial access. Hemostasis was attained with the assistance of a 6 French Angio-Seal device. IMPRESSION: 1. 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 No evidence of type 2 endoleak arising from the IMA, or the right-sided lumbar arteries. Signed, Criselda Peaches, MD [facility] Radiology Electronically Signed   By: Myrle Sheng  Laurence Ferrari M.D.   On: 11/24/2017 14:29   US Renal  Result Date: 11/27/2017 CLINICAL DATA:  83 year old male with acute kidney injury. History of right nephrectomy for renal cell carcinoma. EXAM: RENAL / URINARY TRACT ULTRASOUND COMPLETE COMPARISON:  11/25/1999 FINDINGS: Right Kidney: Not visualized compatible with nephrectomy. Left Kidney: Length: 11.1 cm. Renal echogenicity is normal. Multiple renal cysts are identified, the largest measuring 3.4 cm in the mid-upper left kidney. No hydronephrosis or solid mass identified. Bladder: Appears normal for degree of bladder distention. IMPRESSION: 1. Unremarkable left kidney except for renal cysts. 2. Status post right nephrectomy 3. Unremarkable bladder Electronically Signed   By: Margarette Canada M.D.   On: 11/27/2017 16:57   Ir US Guide Vasc Access Right  Result Date: 11/24/2017 INDICATION: 83 year old male with an enlarging infrarenal abdominal aortic aneurysm despite prior endovascular aortic repair. 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 Patient has significant chronic kidney disease and was admitted yesterday for pre-procedure hydration. He presents now for multi selective angiography and attempted endoleak repair. EXAM:  SELECTIVE VISCERAL ARTERIOGRAPHY; ADDITIONAL ARTERIOGRAPHY; IR ULTRASOUND GUIDANCE VASC ACCESS RIGHT; PELVIC SELECTIVE ARTERIOGRAPHY MEDICATIONS: None ANESTHESIA/SEDATION: Moderate (conscious) sedation was employed during this procedure. A total of Versed 2 mg and Fentanyl 100 mcg was administered intravenously. Moderate Sedation Time: 115 minutes. The patient's level of consciousness and vital signs were monitored continuously by radiology nursing throughout the procedure under my direct supervision. CONTRAST:  46 mL Visipaque 320 FLUOROSCOPY TIME:  Fluoroscopy Time: 19 minutes 12 seconds (911 mGy). COMPLICATIONS: None immediate. PROCEDURE: Informed consent was obtained from the patient following explanation of the procedure, risks, benefits and alternatives. The patient understands, agrees and consents for the procedure. All questions were addressed. A time out was performed prior to the initiation of the procedure. Maximal barrier sterile technique utilized including caps, mask, sterile gowns, sterile gloves, large sterile drape, hand hygiene, and Betadine prep. The right common femoral artery was interrogated with ultrasound and found to be widely patent. An image was obtained and stored for the medical record. Local anesthesia was attained by infiltration with 1% lidocaine. A small dermatotomy was made. Under real-time sonographic guidance, the vessel was punctured with a 21 gauge micropuncture needle. Using standard technique, the initial micro needle was exchanged over a 0.018 micro wire for a transitional 4 [place] micro sheath. The micro sheath was then exchanged over a 0.035 wire for a 5 French vascular sheath. A C2 cobra catheter was advanced of the abdominal aorta over a Bentson wire in used to select the celiac artery. A celiac arteriogram was performed. Conventional anatomy. No evidence of replaced middle colloid artery. The C2 catheter was next advanced into the superior mesenteric artery. A superior  mesenteric arteriogram was performed using carbon dioxide gas. This resulted in suboptimal visualization. Therefore, arteriography was performed using Visipaque. The middle colloid artery arises proximally from the SMA. There is a visible marginal artery of [place] extending into the left colloid artery and then the IMA. No definite filling of the aneurysm sac. An echelon 14 microcatheter was then successfully navigated over a Fathom 14 wire into the middle colloid artery. Arteriography confirms the catheter position. The catheter was next navigated into the marginal artery of Drummond. Arteriography again performed confirming catheter position and anatomy. The artery was then advanced into the left colloid artery. Arteriography again confirms catheter position and anatomy. Still no filling of the aneurysm sac. It is difficult  to tell if the IMA is occluded or if there is outflow from the aneurysm sac. Finally, after significant effort, the microcatheter was successfully advanced into the SMA. Arteriography was performed. The origin of the SMA is occluded. There is a thin channel leading to the aneurysm sac in an filling the Vasa basal rim. There is no endoleak arising from the IMA. The catheter was removed. The C2 cobra catheter was readvanced through the sheath and into the internal iliac artery. An internal iliac arteriogram was performed. The ascending ileo lumbar artery is successfully identified. No evidence of right-sided lumbar artery endoleak. The C2 catheter was removed. A limited right common femoral arteriogram was performed confirming common femoral arterial access. Hemostasis was attained with the assistance of a 6 French Angio-Seal device. IMPRESSION: 1. 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 No evidence of type 2 endoleak arising from the IMA, or the right-sided lumbar arteries. Signed, Criselda Peaches, MD [facility] Radiology Electronically Signed   By: Jacqulynn Cadet M.D.   On: 11/24/2017 14:29   Dg Chest Port 1 View  Result Date: 11/27/2017 CLINICAL DATA:  Respiratory failure EXAM: PORTABLE CHEST 1 VIEW COMPARISON:  11/26/2017 FINDINGS: Cardiomegaly with vascular congestion, small effusions and bibasilar atelectasis. Probable mild perihilar/interstitial edema. No real change since prior study. IMPRESSION: No interval change. Electronically Signed   By: Rolm Baptise M.D.   On: 11/27/2017 08:36   Dg Chest Port 1 View  Result Date: 11/26/2017 CLINICAL DATA:  Atelectasis EXAM: PORTABLE CHEST 1 VIEW COMPARISON:  11/25/2017 FINDINGS: Cardiomegaly with vascular congestion. Bilateral perihilar and lower lobe opacities likely reflect edema with small effusions. Improved aeration on the left since prior study. IMPRESSION: Cardiomegaly with bilateral perihilar and lower lobe opacities, likely edema/ CHF. Suspect small effusions. Improved aeration on the left. Electronically Signed   By: Rolm Baptise M.D.   On: 11/26/2017 07:38   Dg Chest Port 1 View  Result Date: 11/24/2017 CLINICAL DATA:  83 year old male with history of shortness of breath. EXAM: PORTABLE CHEST 1 VIEW COMPARISON:  Chest x-ray 09/03/2017. FINDINGS: New area of airspace consolidation in the right mid to lower lung, not obscuring the right hemidiaphragm, potentially within either the right middle or lower lobe. No pleural effusions. Cephalization of pulmonary vasculature, without frank pulmonary edema. Mild diffuse peribronchial cuffing. Heart size is upper limits of normal. Aortic atherosclerosis. IMPRESSION: 1. Diffuse bronchial wall thickening concerning for an acute bronchitis. In addition, there is a new area of apparent airspace consolidation which could be either within the right middle lobe or right lower lobe, concerning for developing pneumonia. Followup PA and lateral chest X-ray is recommended in  3-4 weeks following trial of antibiotic therapy to ensure resolution and exclude underlying malignancy. 2. Aortic atherosclerosis. Electronically Signed   By: Vinnie Langton M.D.   On: 11/24/2017 11:12    DISCHARGE EXAMINATION: Vitals:   11/28/17 2055 11/29/17 0314 11/29/17 0518 11/29/17 0844  BP: (!) 131/57  (!) 166/65 (!) 166/60  Pulse: 62  65 70  Resp: 18 18 18 18   Temp: 98.4 F (36.9 C)  98.4 F (36.9 C) 98.9 F (37.2 C)  TempSrc: Oral  Oral Oral  SpO2: 93% 94% 95% 92%  Weight: 70.8 kg (156 lb 1.4 oz)     Height:       General appearance: alert, cooperative, appears stated age and no distress Resp: Improved aeration bilaterally.  No crackles.  No wheezing  today. Cardio: regular rate and rhythm, S1, S2 normal, no murmur, click, rub or gallop GI: soft, non-tender; bowel sounds normal; no masses,  no organomegaly  DISPOSITION: To inpatient rehabilitation     Current Inpatient Medications:  Scheduled: . Carbidopa-Levodopa ER  0.5 tablet Oral BID  . docusate sodium  100 mg Oral Daily  . donepezil  5 mg Oral QHS  . guaiFENesin  600 mg Oral BID  . hydrALAZINE  10 mg Oral Q8H  . mouth rinse  15 mL Mouth Rinse BID  . prednisoLONE acetate  1 drop Both Eyes BID   Continuous: . ferric gluconate (FERRLECIT/NULECIT) IV     RVI:[id number], ondansetron (ZOFRAN) IV, oxyCODONE **OR** oxyCODONE   Follow-up Information    Ardeth Sportsman., MD Follow up.   Specialty:  Hematology and Oncology Why:  appointment is on 12/13/17 Contact information: [address] [place] [address] [phone]           TOTAL DISCHARGE TIME: 59 mins  Bonnielee Haff  [facility] Pager [phone]  11/29/2017, 11:37 AM

## 2017-11-29 NOTE — Progress Notes (Signed)
Patient and son in law arrived to unit. Patient settled in to recliner. Answered extensive questions about rehab for family. Patient denies pain, resting comfortably in chair with call bell in place.

## 2017-11-29 NOTE — Progress Notes (Signed)
Report called to receiving rehab nurse. Patient going to room 7. Family has been informed. Patient and family agrees to this. Patient on the way down.

## 2017-11-29 NOTE — H&P (Signed)
Hide copied text Hover for details       Physical Medicine and Rehabilitation Admission H&P  Chief complaint: Weakness  GGY:IRSWN Jenetta Downer Stickler is a 83 y.o. right handed male with history significant for CKD stage IV, dementia, parkinsonian maintained on Sinemet, AAA with prior endovascular repair and angiogram 2009, renal cell carcinoma solitary left kidney, hypertension, COPD, chronic anemia with pancytopenia. Per chart review and daughter, patient lives alone and independent. He does not drive. Family checks on him as needed. One level home. Presented 11/23/2017 after seeing his nephrologist for possible AAA endoleak repair by interventional radiology. 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 Postprocedure mildly hypoxic. Chest x-ray showed right lower lobe consolidation versus atelectasis. Patient remained on oxygen therapy and follow-up her pulmonary services. Patient recovered nicely and pulmonary services signed off 11/28/2017. Renal services follow-up for CKD stage III with elevated creatinine. Renal ultrasound unremarkable left kidney. Latest creatinine 3.02 with expected slow recovery. On 11/28/2017 cardiology services consulted for new onset atrial fibrillation with slow ventricular response felt to be exacerbated by his current medical illness. Patient had no chest pain or shortness of breath. Echocardiogram completed 11/28/2017 showing ejection fraction of 60% no wall motion abnormalities. Patient not felt to be a Coumadin candidate due to fall risk as well with noted history of pancytopenia Physical and occupational therapy evaluations completed with recommendations of physical medicine rehabilitation consult. Patient was admitted for a comprehensive rehabilitation program  Review of Systems  Constitutional: Negative for chills and fever.  Generalized weakness  HENT: Negative for hearing loss.    Eyes: Negative for blurred vision and double vision.  Respiratory: Positive for shortness of breath.  Cardiovascular: Positive for leg swelling. Negative for chest pain and palpitations.  Gastrointestinal: Positive for constipation. Negative for nausea and vomiting.  Musculoskeletal: Positive for joint pain and myalgias.  Skin: Negative for rash.  Psychiatric/Behavioral: Positive for memory loss.  All other systems reviewed and are negative.       Past Medical History:  Diagnosis Date  . Abdominal aortic aneurysm ([place]) 09/2008   s/o endovascular repair and angiogram 10/04/08   . Anemia    EGD Cscope 08-2012  . Arthritis    "hips" (11/23/2017)  . Atrial fibrillation ([place])   . Bleeding stomach ulcer 1990s  . Bleeding ulcer 1997  . CKD (chronic kidney disease), stage IV ([facility])    Stable, Dr. Dimas Aguas  . COPD (chronic obstructive pulmonary disease) ([place])    "mild" (11/23/2017)  . Dementia    "on very small dose of Aricept" (11/23/2017)  . H/O hyperkalemia    Stable  . Hypertension   . Leukopenia 2/11   thought to be from doxy  . Pneumonia 2018 X 2  . Renal cell carcinoma    clear cell type dx 10/09, s/p excision 10-24-08  . Renal insufficiency   . Solitary left kidney    W/ chronic tubule-interstitial damage, Dr. Dimas Aguas  . Spinal stenosis    causing LE weakness-persistant, being worked up by Hydrographic surveyor and neurologist        Past Surgical History:  Procedure Laterality Date  . ABDOMINAL AORTIC ANEURYSM REPAIR  2009   EVAR  . CATARACT EXTRACTION W/ INTRAOCULAR LENS IMPLANT, BILATERAL Bilateral   . HEMORRHOID SURGERY  1990  . IR ANGIOGRAM PELVIS SELECTIVE OR SUPRASELECTIVE  11/24/2017  . IR ANGIOGRAM SELECTIVE EACH ADDITIONAL VESSEL  11/24/2017  . IR ANGIOGRAM SELECTIVE EACH ADDITIONAL VESSEL  11/24/2017  . IR ANGIOGRAM SELECTIVE EACH ADDITIONAL VESSEL  11/24/2017  . IR ANGIOGRAM SELECTIVE EACH ADDITIONAL VESSEL  11/24/2017  . IR ANGIOGRAM VISCERAL SELECTIVE   11/24/2017  . IR ANGIOGRAM VISCERAL SELECTIVE  11/24/2017  . IR RADIOLOGIST EVAL & MGMT  09/07/2017  . IR US GUIDE VASC ACCESS RIGHT  11/24/2017  . [place] (lip), 2004  . NEPHRECTOMY  2009   Open nephrectomy for cancer        Family History  Problem Relation Age of Onset  . Diabetes Father    ?  . Stroke Father   . Hypertension Father   . Colon cancer Neg Hx   . Prostate cancer Neg Hx    Social History: reports that he quit smoking about 9 years ago. He has a 90.00 pack-year smoking history. he has never used smokeless tobacco. He reports that he does not drink alcohol or use drugs.  Allergies:       Allergies  Allergen Reactions  . Iodinated Diagnostic Agents Other (See Comments)    Only one kidney   . Ioxaglate Other (See Comments)    Only one kidney   . Baclofen Other (See Comments)    Unable to walk per Pt's family          Medications Prior to Admission  Medication Sig Dispense Refill  . Carbidopa-Levodopa ER (SINEMET CR) 25-100 MG tablet controlled release Take 0.5 tablets by mouth 2 (two) times daily.    . Cholecalciferol (VITAMIN D) 2000 units CAPS Take 2,000 Units by mouth daily.    Marland Kitchen docusate sodium (COLACE) 100 MG capsule Take 100 mg by mouth daily.    Marland Kitchen donepezil (ARICEPT) 5 MG tablet Take 5 mg by mouth at bedtime.    . FeFum-FePo-FA-B Cmp-C-Zn-Mn-Cu (SE-TAN PLUS) 162-115.2-1 MG CAPS Take 1 capsule by mouth daily. Take 1 Tab daily    . hydrALAZINE (APRESOLINE) 25 MG tablet Take 25 mg by mouth 3 (three) times daily.    . hydrochlorothiazide (HYDRODIURIL) 12.5 MG tablet Take 12.5 mg daily by mouth.    . labetalol (NORMODYNE) 100 MG tablet Take 1 tablet (100 mg total) by mouth 2 (two) times daily. 60 tablet 6  . prednisoLONE acetate (PRED FORTE) 1 % ophthalmic suspension Place 1 drop into both eyes 2 (two) times daily.  0  . pregabalin (LYRICA) 50 MG capsule Take 50 mg by mouth at bedtime.     . sodium polystyrene (KAYEXALATE) 15 GM/60ML suspension  Take 15 g by mouth 2 (two) times a week. Take 15 Grams on Monday and Friday     Drug Regimen Review  Drug regimen was reviewed and remains appropriate with no significant issues identified  Home:  Home Living  Family/patient expects to be discharged to:: Private residence  Living Arrangements: Alone  Available Help at Discharge: (daughter and son in law can provide 24/7 assist; they are ve)  Type of Home: House  Home Access: Stairs to enter  [employer] of Steps: 1  Home Layout: One level  Bathroom Shower/Tub: Tourist information centre manager: Standard  Bathroom Accessibility: Yes  Home Equipment: Environmental consultant - 2 wheels, Cane - single point, Bedside commode, Shower seat  Lives With: Alone  Functional History:  Prior Function  Level of Independence: Independent  Comments: pt's daughter drives him wherever he needs to go. Pt was receiving OP therapy services 2x/week  Functional Status:  Mobility:  Bed Mobility  Overal bed mobility: Needs Assistance  Bed Mobility: Supine to Sit  Supine to sit: Min assist  General bed mobility comments: pt up in recliner with PT upon arrival  Transfers  Overall transfer level: Needs assistance  Equipment used: Rolling walker (2 wheeled)  Transfers: Sit to/from Stand, [employer] Transfers  Sit to Stand: Mod assist, +2 safety/equipment  Stand pivot transfers: Max assist, +2 physical assistance, +2 safety/equipment  General transfer comment: Moderate physical assist to power up to stand x3, initial posterior bias noted, Multi modal cues for posture and positioning. With cues patient able to correct to upright and bring weight forward to improve positioning with RW  Ambulation/Gait  Ambulation/Gait assistance: Mod assist, +2 physical assistance  Ambulation Distance (Feet): 40 Feet(40 x3)  Assistive device: Rolling walker (2 wheeled)  Gait Pattern/deviations: Step-to pattern, Trunk flexed, Narrow base of support, Festinating, Decreased stride  length  General Gait Details: Patient with very festinating gait and bilateral LEs buckling initially, VCs for increased gait speed and stide, cued to over exagerate steps with marked improvement in patients ability  Gait velocity: decreased   ADL:  ADL  Overall ADL's : Needs assistance/impaired  Eating/Feeding: Set up, Sitting  Grooming: Set up, Supervision/safety, Sitting, Wash/dry face, Wash/dry hands  Upper Body Bathing: Min guard, Sitting  Lower Body Bathing: Minimal assistance, Sitting/lateral leans  Upper Body Dressing : Min guard, Sitting  Lower Body Dressing: Moderate assistance, +2 for physical assistance, +2 for safety/equipment, Sit to/from stand  Lower Body Dressing Details (indicate cue type and reason): Pt able to reach and adjust socks sitting in recliner  Toilet Transfer: +2 for safety/equipment, +2 for physical assistance, RW, Moderate assistance, Ambulation, Stand-pivot  Toilet Transfer Details (indicate cue type and reason): simulated in transfer to [place] and Hygiene: +2 for physical assistance, +2 for safety/equipment, Sit to/from stand, Moderate assistance  Functional mobility during ADLs: +2 for physical assistance, +2 for safety/equipment, Rolling walker, Moderate assistance  General ADL Comments: Pt with B knee buckling during transfer and ambulation requiring increased assist  Cognition:  Cognition  Overall Cognitive Status: History of cognitive impairments - at baseline  Orientation Level: Oriented to person, Oriented to place, Oriented to situation  Cognition  Arousal/Alertness: Awake/alert  Behavior During Therapy: WFL for tasks assessed/performed  Overall Cognitive Status: History of cognitive impairments - at baseline  General Comments: pt with dementia at baseline  Physical Exam:  Blood pressure (!) 166/65, pulse 65, temperature 98.4 F (36.9 C), temperature source Oral, resp. rate 18, height 5\' 8"  (1.727 m), weight  70.8 kg (156 lb 1.4 oz), SpO2 95 %.  Physical Exam  Vitals reviewed.  Constitutional: He appears well-developed.  Frail patient is sitting in his chair in appearing  HENT:  Head: Normocephalic and atraumatic.  Eyes: EOM are normal. Right eye exhibits no discharge. Left eye exhibits no discharge.  Neck: Normal range of motion. Neck supple. No thyromegaly present.  Cardiovascular:  Irregularly irregular without murmur  Respiratory:  Limited inspiratory effort, breath sounds may have been a bit decreased at the right base  +[place]  GI: Soft. Bowel sounds are normal. He exhibits no distension/nontender  Skin. Warm and dry  Musculoskeletal: He exhibits edema (LE). He exhibits no tenderness.  Neurological: Appears somewhat lethargic. He awakens to verbal and tactile cuing. He had difficulties following basic simple commands seemingly distracted at times. Family member in the room to continuously assist him with answers. He is oriented to person and place. Strength in the upper extremities 3- to 3 out  of 5 with deltoids biceps and triceps and 3-3+ with wrist and hand. Lower extremity strength is 3- hip flexion knee extension to 3 out of 5 ankle flexion dorsiflexion. Does appear to be intact to pain and light touch. Reflexes are symmetrical and 1+. He is hard of hearing. Speech is dysarthric. Psych: Patient fairly pleasant when awake  Lab Results Last 48 Hours                                                                                                                                                                                                                                                                                                                                            Imaging Results (Last 48 hours)     Medical Problem List and Plan:  1. Debility secondary to acute respiratory failure with hypoxia  -Admit to inpatient rehab    2. DVT Prophylaxis/Anticoagulation: SCDs. Monitor for any signs of DVT  3. Pain Management: Oxycodone. Monitor mental status  4. Mood/dementia: Aricept 5 mg daily  5. Neuropsych: This patient is capable of making decisions on his own behalf.  6. Skin/Wound Care: Routine skin checks  7. Fluids/Electrolytes/Nutrition: Routine I&O's with follow-up chemistries  8. New onset atrial fibrillation. Follow-up per cardiology services. Currently not a candidate for Coumadin therapy. Patient is asymptomatic  9. Acute on chronic kidney disease stage IV as well as hyperkalemia. Follow-up per renal services. Patient was using Kayexalate 15 g 2 times a week Mondays and Fridays prior to admission. Follow electrolytes closely. He is followed by [facility] nephrology [place], [place] Dr. Dimas Aguas  10. History of AAA with concern for endoleak. Follow-up interventional radiology no leak identified on workup  11. History of renal cell carcinoma with solitary kidney. Monitor urine output  12. Anemia of chronic disease. Continue Aranesp  13. Pancytopenia/thrombocytopenia. [facility]ed hematology in [facility]  14. History of Parkinson's disease. Continue Sinemet.    Post Admission Physician Evaluation:  1. Functional deficits secondary to debility after acute respiratory failure. 2. Patient is admitted to receive collaborative, interdisciplinary care between the physiatrist, rehab nursing staff, and therapy team. 3. Patient's level of medical complexity and substantial therapy needs in context of that medical necessity cannot be provided at a lesser intensity of care such as a SNF. 4. Patient has experienced substantial functional loss from his/her baseline which was documented above under the "Functional History" and "Functional Status" headings. Judging by the patient's diagnosis, physical exam, and functional history, the patient has potential for functional progress which will result in  measurable gains while on inpatient rehab. These gains will be of substantial and practical use upon discharge in facilitating mobility and self-care at the household level. 5. Physiatrist will provide 24 hour management of medical needs as well as oversight of the therapy plan/treatment and provide guidance as appropriate regarding the interaction of the two. 6. The Preadmission Screening has been reviewed and patient status is unchanged unless otherwise stated above. 7. 24 hour rehab nursing will assist with bladder management, bowel management, safety, skin/wound care, disease management, medication administration, pain management and patient education and help integrate therapy concepts, techniques,education, etc. 8. PT will assess and treat for/with: Lower extremity strength, range of motion, stamina, balance, functional mobility, safety, adaptive techniques and equipment, neuromuscular reeducation, cognitive perceptual remediation, family education. Goals are: Supervision to minimal assistance. 9. OT will assess and treat for/with: ADL's, functional mobility, safety, upper extremity strength, adaptive techniques and equipment, neuromuscular reeducation cognitive perceptual remediation, family education. Goals are: Supervision to minimal assistance . Therapy may proceed with showering this patient. 10. SLP will assess and treat for/with: Swallowing, speech, communication. Goals are: Supervision. 11. Case Management and Social Worker will assess and treat for psychological issues and discharge planning. 12. Team conference will be held weekly to assess progress toward goals and to determine barriers to discharge. 13. Patient will receive at least 3 hours of therapy per day at least 5 days per week. 14. ELOS: 16-20 days  15. Prognosis: excellent   Meredith Staggers, MD, [facility]  11/29/2017  Lavon Paganini Walcott, PA-C  11/29/2017

## 2017-11-30 ENCOUNTER — Inpatient Hospital Stay (HOSPITAL_COMMUNITY): Payer: Medicare Other

## 2017-11-30 ENCOUNTER — Inpatient Hospital Stay (HOSPITAL_COMMUNITY): Payer: Medicare Other | Admitting: Occupational Therapy

## 2017-11-30 DIAGNOSIS — N184 Chronic kidney disease, stage 4 (severe): Secondary | ICD-10-CM

## 2017-11-30 DIAGNOSIS — D696 Thrombocytopenia, unspecified: Secondary | ICD-10-CM

## 2017-11-30 DIAGNOSIS — I10 Essential (primary) hypertension: Secondary | ICD-10-CM

## 2017-11-30 DIAGNOSIS — E46 Unspecified protein-calorie malnutrition: Secondary | ICD-10-CM

## 2017-11-30 DIAGNOSIS — D638 Anemia in other chronic diseases classified elsewhere: Secondary | ICD-10-CM

## 2017-11-30 LAB — URINALYSIS, ROUTINE W REFLEX MICROSCOPIC
Bilirubin Urine: NEGATIVE
Glucose, UA: NEGATIVE mg/dL
KETONES UR: NEGATIVE mg/dL
Leukocytes, UA: NEGATIVE
Nitrite: NEGATIVE
Protein, ur: 30 mg/dL — AB
SPECIFIC GRAVITY, URINE: 1.016 (ref 1.005–1.030)
pH: 5 (ref 5.0–8.0)

## 2017-11-30 LAB — CBC WITH DIFFERENTIAL/PLATELET
Basophils Absolute: 0 10*3/uL (ref 0.0–0.1)
Basophils Relative: 1 %
EOS PCT: 5 %
Eosinophils Absolute: 0.2 10*3/uL (ref 0.0–0.7)
HEMATOCRIT: 29.7 % — AB (ref 39.0–52.0)
HEMOGLOBIN: 9.4 g/dL — AB (ref 13.0–17.0)
LYMPHS ABS: 0.5 10*3/uL — AB (ref 0.7–4.0)
LYMPHS PCT: 13 %
MCH: 30 pg (ref 26.0–34.0)
MCHC: 31.6 g/dL (ref 30.0–36.0)
MCV: 94.9 fL (ref 78.0–100.0)
Monocytes Absolute: 0.5 10*3/uL (ref 0.1–1.0)
Monocytes Relative: 13 %
NEUTROS ABS: 2.4 10*3/uL (ref 1.7–7.7)
NEUTROS PCT: 69 %
Platelets: 103 10*3/uL — ABNORMAL LOW (ref 150–400)
RBC: 3.13 MIL/uL — AB (ref 4.22–5.81)
RDW: 13.8 % (ref 11.5–15.5)
WBC: 3.5 10*3/uL — AB (ref 4.0–10.5)

## 2017-11-30 LAB — COMPREHENSIVE METABOLIC PANEL
ALK PHOS: 57 U/L (ref 38–126)
ALT: 9 U/L — AB (ref 17–63)
AST: 16 U/L (ref 15–41)
Albumin: 3 g/dL — ABNORMAL LOW (ref 3.5–5.0)
Anion gap: 11 (ref 5–15)
BILIRUBIN TOTAL: 0.8 mg/dL (ref 0.3–1.2)
BUN: 55 mg/dL — AB (ref 6–20)
CALCIUM: 8.6 mg/dL — AB (ref 8.9–10.3)
CO2: 24 mmol/L (ref 22–32)
CREATININE: 2.65 mg/dL — AB (ref 0.61–1.24)
Chloride: 104 mmol/L (ref 101–111)
GFR, EST AFRICAN AMERICAN: 23 mL/min — AB (ref >60)
GFR, EST NON AFRICAN AMERICAN: 20 mL/min — AB (ref >60)
Glucose, Bld: 110 mg/dL — ABNORMAL HIGH (ref 65–99)
Potassium: 4.3 mmol/L (ref 3.5–5.1)
Sodium: 139 mmol/L (ref 135–145)
Total Protein: 5.9 g/dL — ABNORMAL LOW (ref 6.5–8.1)

## 2017-11-30 LAB — BRAIN NATRIURETIC PEPTIDE: B Natriuretic Peptide: 1654 pg/mL — ABNORMAL HIGH (ref 0.0–100.0)

## 2017-11-30 MED ORDER — GUAIFENESIN 100 MG/5ML PO SOLN: 600.0000 mg | ORAL | Status: DC | PRN

## 2017-11-30 MED ORDER — PRO-STAT SUGAR FREE PO LIQD
30.0000 mL | Freq: Two times a day (BID) | ORAL | Status: DC
  Administered 2017-11-30 – 2017-12-07 (×15): 30 mL via ORAL
  Filled 2017-11-30 (×14): qty 30

## 2017-11-30 MED ORDER — LEVOFLOXACIN 750 MG PO TABS
750.0000 mg | ORAL_TABLET | Freq: Once | ORAL | Status: AC
  Administered 2017-11-30: 750 mg via ORAL
  Filled 2017-11-30: qty 1

## 2017-11-30 MED ORDER — DOCUSATE SODIUM 50 MG/5ML PO LIQD
100.0000 mg | Freq: Every day | ORAL | Status: DC
  Administered 2017-11-30 – 2017-12-07 (×8): 100 mg via ORAL
  Filled 2017-11-30 (×8): qty 10

## 2017-11-30 MED ORDER — LEVOFLOXACIN 500 MG PO TABS
500.0000 mg | ORAL_TABLET | ORAL | Status: DC
  Administered 2017-12-02 – 2017-12-06 (×3): 500 mg via ORAL
  Filled 2017-11-30 (×3): qty 1

## 2017-11-30 MED ORDER — GUAIFENESIN 100 MG/5ML PO SOLN
600.0000 mg | Freq: Two times a day (BID) | ORAL | Status: DC
  Administered 2017-11-30 – 2017-12-07 (×15): 600 mg via ORAL
  Filled 2017-11-30: qty 30
  Filled 2017-11-30: qty 10
  Filled 2017-11-30 (×5): qty 30
  Filled 2017-11-30: qty 10
  Filled 2017-11-30: qty 30
  Filled 2017-11-30: qty 10
  Filled 2017-11-30 (×5): qty 30
  Filled 2017-11-30: qty 20

## 2017-11-30 MED ORDER — IPRATROPIUM-ALBUTEROL 0.5-2.5 (3) MG/3ML IN SOLN
3.0000 mL | Freq: Four times a day (QID) | RESPIRATORY_TRACT | Status: DC | PRN
  Administered 2017-11-30 – 2017-12-03 (×2): 3 mL via RESPIRATORY_TRACT
  Filled 2017-11-30 (×2): qty 3

## 2017-11-30 MED ORDER — CARBIDOPA-LEVODOPA 25-100 MG PO TABS
0.5000 | ORAL_TABLET | Freq: Three times a day (TID) | ORAL | Status: DC
  Administered 2017-11-30 – 2017-12-01 (×5): 0.5 via ORAL
  Filled 2017-11-30 (×6): qty 1

## 2017-11-30 NOTE — Progress Notes (Signed)
[facility] Progress Note  Subjective: creat trending down!! 1600 UOP again. Moved to rehab- Rhythm irreg and controlled today, No c/o, denies any SOB.  Pt sitting up in clothes but daughter still feels he is out of it  Vitals:   11/29/17 1540 11/30/17 0427  BP: (!) 162/74 (!) 157/75  Pulse: 63 78  Resp: 18 20  Temp: 97.9 F (36.6 C) 98.3 F (36.8 C)  TempSrc: Oral Oral  SpO2: 97% 97%  Weight:  69 kg (152 lb 1.9 oz)    Inpatient medications: . Carbidopa-Levodopa ER  0.5 tablet Oral BID  . docusate  100 mg Oral Daily  . donepezil  5 mg Oral QHS  . feeding supplement (PRO-STAT SUGAR FREE 64)  30 mL Oral BID  . guaiFENesin  600 mg Oral BID  . hydrALAZINE  10 mg Oral Q8H  . mouth rinse  15 mL Mouth Rinse BID  . prednisoLONE acetate  1 drop Both Eyes BID   . ferric gluconate (FERRLECIT/NULECIT) IV     albuterol, ondansetron (ZOFRAN) IV, oxyCODONE **OR** oxyCODONE, sorbitol  Exam: Gen elderly HOH male, no distress, in chair No jvd or bruits Chest minimal crackles R base, L clear RRR no MRG  Abd soft ntnd no mass or ascites +bs  GU normal male w condom cath in place defer MS no joint effusions or deformity Ext 1+ dependent edema Neuro is sleepy but easily awakens, NF, ox 2  No UA CXR 12/01 > Cardiomegaly with bilateral perihilar and lower lobe opacities, likely edema/ CHF. Suspect small effusions.  CXR 12/02 - improved CHF    Impression: 1. Acute kidney injury - due to contrast nephropathy most likely. Seeing crt trend in right direction. Making urine.   renal US no hydro.  Contrast injury usually will recover. DC'd lyrica for now as need to avoid sedating meds w/ potential for uremia in next 48 hrs.  2. CKD baseline creat 2.1- 2.5, stage IV ckd.  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 Will make recovery slower.  Followed as OP by Dr. Dimas Aguas  3. Vol / pulm edema/ dyspnea - improved, CXR better. Still has edema but w AKI, holding further lasix.    4. Bradycardia - afib on EKG,   Have dc'd the low dose labetalol. HR Improved.  BP overall high- could resume his home hydralazine  5. Anemia - mesenteric angiogram did not show any signs of endoleak, this was reason for admission. Will check iron stores and give ESA- iron stores low, being repleted- he does not need ESA or iron at discharge 6. HTN - previously low normal, meds d/cd.  Now  resumed low dose hydralazine 7. Dementia/ Parkinson's- complicating issue but daughter says was living independantly.    Renal function approaching baseline- I will order labs for Saturday to hopefully demonstrate stability but renal will sign off from seeing daily.  Call with questions     [name] A  11/30/2017, 12:58 PM   Recent Labs  Lab 11/28/17 0543 11/29/17 0429 11/30/17 0650  NA 135 138 139  K 4.3 4.1 4.3  CL 101 99* 104  CO2 25 28 24   GLUCOSE 108* 109* 110*  BUN 60* 59* 55*  CREATININE 3.33* 3.02* 2.65*  CALCIUM 8.1* 8.5* 8.6*   Recent Labs  Lab 11/23/17 1953 11/30/17 0650  AST 19 16  ALT 7* 9*  ALKPHOS 46 57  BILITOT 0.6 0.8  PROT 5.7* 5.9*  ALBUMIN 3.5 3.0*   Recent Labs  Lab 11/26/17 0523 11/29/17 0429 11/30/17 0650  WBC 3.6* 3.3* 3.5*  NEUTROABS  --   --  2.4  HGB 8.7* 9.2* 9.4*  HCT 27.5* 28.1* 29.7*  MCV 97.2 95.6 94.9  PLT 74* 96* 103*   Iron/TIBC/Ferritin/ %Sat    Component Value Date/Time   IRON 13 (L) 11/29/2017 0429   TIBC 189 (L) 11/29/2017 0429   FERRITIN 207 11/29/2017 0429   IRONPCTSAT 7 (L) 11/29/2017 9290

## 2017-11-30 NOTE — Progress Notes (Signed)
Marlowe [place], PA notified of elevated BNP levels.  No new orders written at this time.  Brita Romp, RN

## 2017-11-30 NOTE — Patient Care Conference (Signed)
Inpatient RehabilitationTeam Conference and Plan of Care Update Date: 11/30/2017   Time: 2:20 PM    Patient Name: [name]      Medical Record Number: [id number]  Date of Birth: [birth year] Sex: Male         Room/Bed: 4M07C/4M07C-01 Payor Info: Payor: MEDICARE / Plan: MEDICARE PART A AND B / Product Type: *No Product type* /    Admitting Diagnosis: Debility  Admit Date/Time:  11/29/2017  3:49 PM Admission Comments: No comment available   Primary Diagnosis:  Debility Principal Problem: Debility  Patient Active Problem List   Diagnosis Date Noted  . Dysphagia   . Pneumonia of left lower lobe due to infectious organism ([place])   . Hypoalbuminemia due to protein-calorie malnutrition ([place])   . Benign essential HTN   . Thrombocytopenia ([place])   . Anemia of chronic disease   . Chronic kidney disease (CKD), stage IV (severe) ([place])   . Debility 11/29/2017  . Leaking abdominal aortic aneurysm (AAA) ([place]) 11/24/2017  . Pressure injury of skin 11/23/2017  . Parkinsonism ([place]) 07/10/2017  . Status post dilatation of esophageal stricture 05/15/2017  . Pancytopenia ([place]) 03/31/2016  . Follow-up --- PCP NOTES 09/02/2015  . Neuropathy 06/10/2015  . Pedal edema 01/30/2015  . Hip pain, chronic 01/30/2015  . Back pain 01/30/2015  . Cramps of lower extremity 01/30/2015  . Aftercare following surgery of the circulatory system, [place] 08/05/2014  . Annual physical exam 10/19/2011  . DJD (degenerative joint disease) 12/14/2010  . Anemia 06/06/2009  . COPD (chronic obstructive pulmonary disease) ([place]) 06/06/2009  . RENAL CELL CANCER 12/25/2008  . HTN (hypertension) 12/25/2008  . AAA (abdominal aortic aneurysm) without rupture ([place]) 12/25/2008  . CKD (chronic kidney disease) 12/25/2008  . HYPERCHOLESTEROLEMIA 03/01/2008  . GERD 03/01/2008  . ACNE ROSACEA 11/29/2007  . DIVERTICULOSIS, COLON 04/12/2007    Expected Discharge Date: Expected Discharge Date: 12/13/17  Team Members  Present: Physician leading conference: Dr. Delice Lesch Social Worker Present: Ovidio Kin, LCSW Nurse Present: Arelia Sneddon, RN PT Present: Michaelene Song, PT OT Present: Clyda Greener, OT SLP Present: Weston Anna, SLP PPS Coordinator present : Daiva Nakayama, RN, CRRN     Current Status/Progress Goal Weekly Team Focus  Medical   Debility secondary to acute respiratory failure with hypoxia  Improve mobility, endurance, AKI, HTN  See above   Bowel/Bladder   Continent of bowel and bladder during day.  Has had some incontinent episodes.  LBM 12/6  Min assist  Assess and treat for constipation as needed   Swallow/Nutrition/ Hydration   Dys 2, thin liquids; full supervision   supervision   trials of advanced textures    ADL's   max asssist for all bathing with mod to max assist for sit to stand transitions.  Mod assist for pivot transfers to the toilet.  Decreased initiation and cogntivie sequencing.  supervision to min assist overall  selfcare retraining, balance retraining, transfer training, therapeutic exercise. pt/family education, therapeutic activities.    Mobility   min assist bed mobility, mod-max for sit<>stand, standing balance and gait up to 20 ft with RW  supervision-min assist goals  standing balance, transfers, postural control, endurance, gait   Communication             Safety/Cognition/ Behavioral Observations  min assist   supervision   basic cognition    Pain   Denies pain  < 3  Assess and treat for pain q shift and prn   Skin  Scattered bruising throughout the body.  MASD to scrotum- MCG powder; tip of penis ecchymotic after removal of condom catheter on uncircumcised penis.  Patient will have no further skin breakdown or infection with mod assist.  Assess skin q shift and prn      *See Care Plan and progress notes for long and short-term goals.     Barriers to Discharge  Current Status/Progress Possible Resolutions Date Resolved   Physician    Medical  stability;New oxygen     See above  Therapies, follow labs, optimize BP meds      Nursing                  PT                    OT                  SLP Decreased caregiver support              SW                Discharge Planning/Teaching Needs:    Home with daughter arranging 24 hr care, pt was home alone prior to admission and this is new for them     Team Discussion:  Goals supervision-min assist level. Currently mod/max level and has a posterior lean. Medical issues watching AKI, Pneumonia and HTN. New evaluation today. Poor iniaition and carryover hopefully will get better when adjusts to the unit  Revisions to Treatment Plan:  DC 12/18    Continued Need for Acute Rehabilitation Level of Care: The patient requires daily medical management by a physician with specialized training in physical medicine and rehabilitation for the following conditions: Daily direction of a multidisciplinary physical rehabilitation program to ensure safe treatment while eliciting the highest outcome that is of practical value to the patient.: Yes Daily medical management of patient stability for increased activity during participation in an intensive rehabilitation regime.: Yes Daily analysis of laboratory values and/or radiology reports with any subsequent need for medication adjustment of medical intervention for : Renal problems;Pulmonary problems;Blood pressure problems  Nola Botkins, Gard[place] Rhyme 12/02/2017, 8:47 AM

## 2017-11-30 NOTE — Progress Notes (Signed)
Patient information reviewed and entered into eRehab system by Daiva Nakayama, RN, CRRN, [place] Coordinator.  Information including medical coding and functional independence measure will be reviewed and updated through discharge.     Per nursing patient and family given "Data Collection Information Summary for Patients in Inpatient Rehabilitation Facilities with attached "Privacy Act [place] Records" upon admission.

## 2017-11-30 NOTE — Progress Notes (Signed)
Called xray results to [facility], [place].  New orders received to start empiric levaquin (at dosage suggested by pharmacy due to kidney impairment), duonebs q6h prn, check oxygen saturation q shift and apply oxygen at 2L via  if saturation < 90%.  Repeat CXR to be done in am.  Family informed of updates.  Brita Romp, RN

## 2017-11-30 NOTE — Progress Notes (Addendum)
[facility] PHYSICAL MEDICINE & REHABILITATION     PROGRESS NOTE  Subjective/Complaints:  Patient seen transferring with nurse and daughter this AM. Daughter answers all questions for patient. He states patient slept fairly overnight and was confused after the transfer.  ROS: Limited due to cognition and daughter answering questions.  Objective: Vital Signs: Blood pressure (!) 157/75, pulse 78, temperature 98.3 F (36.8 C), temperature source Oral, resp. rate 20, weight 69 kg (152 lb 1.9 oz), SpO2 97 %. No results found. Recent Labs    11/29/17 0429 11/30/17 0650  WBC 3.3* 3.5*  HGB 9.2* 9.4*  HCT 28.1* 29.7*  PLT 96* 103*   Recent Labs    11/29/17 0429 11/30/17 0650  NA 138 139  K 4.1 4.3  CL 99* 104  GLUCOSE 109* 110*  BUN 59* 55*  CREATININE 3.02* 2.65*  CALCIUM 8.5* 8.6*   CBG (last 3)  No results for input(s): GLUCAP in the last 72 hours.  Wt Readings from Last 3 Encounters:  11/30/17 69 kg (152 lb 1.9 oz)  11/28/17 70.8 kg (156 lb 1.4 oz)  11/02/17 72.7 kg (160 lb 4 oz)    Physical Exam:  BP (!) 157/75 (BP Location: Left Arm)   Pulse 78   Temp 98.3 F (36.8 C) (Oral)   Resp 20   Wt 69 kg (152 lb 1.9 oz)   SpO2 97%   BMI 23.13 kg/m  Constitutional: He appears well-developed. Frail  HENT: Normocephalic and atraumatic.  Eyes: EOM are normal. No discharge.  Cardiovascular: Irregularly irregular. No JVD Respiratory: Limited inspiratory effort.  +[place]. Upper airway sounds.  GI: Bowel sounds are normal. He exhibits no distension/nontender  Musculoskeletal: He exhibits edema (LE). He exhibits no tenderness.  Neurological: Awake Distracted at times.  Motor: B/l UE 3/5 proximal to distal  B/l LE: 3-/5 proximal to distal HOH Dysarthria. Skin. Warm and dry. Intact.  Psych: Unable to thoroughly assess   Assessment/Plan: 1. Functional deficits secondary to debility which require 3+ hours per day of interdisciplinary therapy in a comprehensive inpatient  rehab setting. Physiatrist is providing close team supervision and 24 hour management of active medical problems listed below. Physiatrist and rehab team continue to assess barriers to discharge/monitor patient progress toward functional and medical goals.  Function:  Bathing Bathing position      Bathing parts      Bathing assist        Upper Body Dressing/Undressing Upper body dressing                    Upper body assist        Lower Body Dressing/Undressing Lower body dressing                                  Lower body assist        Toileting Toileting          Toileting assist     Transfers Chair/bed transfer             Locomotion Ambulation           Wheelchair          Cognition Comprehension Comprehension assist level: Understands basic 75 - 89% of the time/ requires cueing 10 - 24% of the time  Expression Expression assist level: Expresses basic 75 - 89% of the time/requires cueing 10 - 24% of the time. Needs helper to occlude trach/needs  to repeat words.  Social Interaction Social Interaction assist level: Interacts appropriately 75 - 89% of the time - Needs redirection for appropriate language or to initiate interaction.  Problem Solving Problem solving assist level: Solves basic 75 - 89% of the time/requires cueing 10 - 24% of the time  Memory Memory assist level: Recognizes or recalls 75 - 89% of the time/requires cueing 10 - 24% of the time    Medical Problem List and Plan:  1. Debility secondary to acute respiratory failure with hypoxia    Begin CIR 2. DVT Prophylaxis/Anticoagulation: SCDs. Monitor for any signs of DVT  3. Pain Management: Oxycodone. Monitor mental status  4. Mood/dementia: Aricept 5 mg daily  5. Neuropsych: This patient is capable of making decisions on his own behalf.  6. Skin/Wound Care: Routine skin checks  7. Fluids/Electrolytes/Nutrition: Routine I&O's  8. New onset atrial fibrillation.  Follow-up per cardiology services. Currently not a candidate for Coumadin therapy. Patient is asymptomatic  9. Acute on chronic kidney disease stage IV as well as hyperkalemia. Follow-up per renal services. Patient was using Kayexalate 15 g 2 times a week Mondays and Fridays prior to admission. Follow electrolytes closely. He is followed by [facility] [place], [place] Dr. Dimas Aguas    Creatinine 2.65 on 12/5   Potassium 4.3 on 12/5   Continue to monitor 10. History of AAA with concern for endoleak. Follow-up interventional radiology no leak identified on workup  11. History of renal cell carcinoma with solitary kidney. Monitor urine output  12. Anemia of chronic disease. Continue Aranesp    Hemoglobin 9.4 on 12/5   Continue to monitor 13. Pancytopenia/thrombocytopenia. Followed hematology in [facility]    Persistent, but stable on 12/5 14. History of Parkinson's disease. Continue Sinemet.  15. HTN   Monitor with increased mobility 16. Hypoalbuminemia    Supplement initiated on 12/5  LOS (Days) 1 A FACE TO FACE EVALUATION WAS PERFORMED  Mistina Coatney Lorie Phenix 11/30/2017 8:29 AM

## 2017-11-30 NOTE — Progress Notes (Signed)
Called by nursing to assist in evaluating pt.  O2 sat of 92% on room air.  Breath sounds are diminished without wheezes or rhonchi.  Pt has weak cough.  Pt is currently resting.  Waiting on xray results at this time.

## 2017-11-30 NOTE — Care Management Note (Signed)
[facility] Individual Statement of Services  Patient Name:  [name]  Date:  11/30/2017  Welcome to the [facility].  Our goal is to provide you with an individualized program based on your diagnosis and situation, designed to meet your specific needs.  With this comprehensive rehabilitation program, you will be expected to participate in at least 3 hours of rehabilitation therapies Monday-Friday, with modified therapy programming on the weekends.  Your rehabilitation program will include the following services:  Physical Therapy (PT), Occupational Therapy (OT), Speech Therapy (ST), 24 hour per day rehabilitation nursing, Case Management (Social Worker), Rehabilitation Medicine, Nutrition Services and Pharmacy Services  Weekly team conferences will be held on Wednesday to discuss your progress.  Your Social Worker will talk with you frequently to get your input and to update you on team discussions.  Team conferences with you and your family in attendance may also be held.  Expected length of stay: 2 weeks  Overall anticipated outcome: supervision-min assist level  Depending on your progress and recovery, your program may change. Your Social Worker will coordinate services and will keep you informed of any changes. Your Social Worker's name and contact numbers are listed  below.  The following services may also be recommended but are not provided by the [facility]:    [place] will be made to provide these services after discharge if needed.  Arrangements include referral to agencies that provide these services.  Your insurance has been verified to be:  [place] Your primary doctor is:  Kathlene November  Pertinent information will be shared with your doctor and your insurance company.  Social Worker:  Ovidio Kin, [place] or ([phone]  Information discussed with and copy given to patient by: Elease Hashimoto, 11/30/2017, 3:03 PM

## 2017-11-30 NOTE — Progress Notes (Signed)
Social Work Assessment and Plan Social Work Assessment and Plan  Patient Details  Name: [name] MRN: [id number] Date of Birth: 1927-04-08  Today's Date: 11/30/2017  Problem List:  Patient Active Problem List   Diagnosis Date Noted  . Hypoalbuminemia due to protein-calorie malnutrition ([place])   . Benign essential HTN   . Thrombocytopenia ([place])   . Anemia of chronic disease   . Chronic kidney disease (CKD), stage IV (severe) ([place])   . Debility 11/29/2017  . Leaking abdominal aortic aneurysm (AAA) ([place]) 11/24/2017  . Pressure injury of skin 11/23/2017  . Parkinsonism ([place]) 07/10/2017  . Status post dilatation of esophageal stricture 05/15/2017  . Pancytopenia ([place]) 03/31/2016  . Follow-up --- PCP NOTES 09/02/2015  . Neuropathy 06/10/2015  . Pedal edema 01/30/2015  . Hip pain, chronic 01/30/2015  . Back pain 01/30/2015  . Cramps of lower extremity 01/30/2015  . Aftercare following surgery of the circulatory system, [place] 08/05/2014  . Annual physical exam 10/19/2011  . DJD (degenerative joint disease) 12/14/2010  . Anemia 06/06/2009  . COPD (chronic obstructive pulmonary disease) ([place]) 06/06/2009  . RENAL CELL CANCER 12/25/2008  . HTN (hypertension) 12/25/2008  . AAA (abdominal aortic aneurysm) without rupture ([place]) 12/25/2008  . CKD (chronic kidney disease) 12/25/2008  . HYPERCHOLESTEROLEMIA 03/01/2008  . GERD 03/01/2008  . ACNE ROSACEA 11/29/2007  . DIVERTICULOSIS, COLON 04/12/2007   Past Medical History:  Past Medical History:  Diagnosis Date  . Abdominal aortic aneurysm ([place]) 09/2008   s/o endovascular repair and angiogram 10/04/08   . Anemia    EGD Cscope 08-2012  . Arthritis    "hips" (11/23/2017)  . Atrial fibrillation ([place])   . Bleeding stomach ulcer 1990s  . Bleeding ulcer 1997  . CKD (chronic kidney disease), stage IV ([facility])    Stable, Dr. Dimas Aguas  . COPD (chronic obstructive pulmonary disease) ([place])    "mild" (11/23/2017)  . Dementia    "on very  small dose of Aricept" (11/23/2017)  . H/O hyperkalemia    Stable  . Hypertension   . Leukopenia 2/11   thought to be from doxy  . Pneumonia 2018 X 2  . Renal cell carcinoma    clear cell type dx 10/09, s/p excision 10-24-08  . Renal insufficiency   . Solitary left kidney    W/ chronic tubule-interstitial damage, Dr. Dimas Aguas  . Spinal stenosis    causing LE weakness-persistant, being worked up by Hydrographic surveyor and neurologist   Past Surgical History:  Past Surgical History:  Procedure Laterality Date  . ABDOMINAL AORTIC ANEURYSM REPAIR  2009   EVAR  . CATARACT EXTRACTION W/ INTRAOCULAR LENS  IMPLANT, BILATERAL Bilateral   . HEMORRHOID SURGERY  1990  . IR ANGIOGRAM PELVIS SELECTIVE OR SUPRASELECTIVE  11/24/2017  . IR ANGIOGRAM SELECTIVE EACH ADDITIONAL VESSEL  11/24/2017  . IR ANGIOGRAM SELECTIVE EACH ADDITIONAL VESSEL  11/24/2017  . IR ANGIOGRAM SELECTIVE EACH ADDITIONAL VESSEL  11/24/2017  . IR ANGIOGRAM SELECTIVE EACH ADDITIONAL VESSEL  11/24/2017  . IR ANGIOGRAM VISCERAL SELECTIVE  11/24/2017  . IR ANGIOGRAM VISCERAL SELECTIVE  11/24/2017  . IR RADIOLOGIST EVAL & MGMT  09/07/2017  . IR US GUIDE VASC ACCESS RIGHT  11/24/2017  . [place] (lip), 2004  . NEPHRECTOMY  2009   Open nephrectomy for cancer   Social History:  reports that he quit smoking about 9 years ago. He has a 90.00 pack-year smoking history. he has never used smokeless tobacco. He reports that  he does not drink alcohol or use drugs.  Family / Support Systems Marital Status: Widow/Widower Patient Roles: Parent Children: [name] [phone] Other Supports: [place]-son in-law [phone] Anticipated Caregiver: Daughter and son in-law along with possible hired assist Ability/Limitations of Caregiver: Daughter and son in-law work but will make arrangements for 24 hr care Caregiver Availability: Other (Comment)(Need to arrange 24 hr care) Family Dynamics: Daughter and  son in-law are very involved and have been the caregivers for pt's deceased wife for the last eight years of her life. Daughter reports her half brother is not involved nor will he assist at discharge. She and husband take on the main roles along with their own jobs they hold.  Social History Preferred language: English Religion: Christian Cultural Background: No issues Education: Secretary/administrator educated Read: Yes Write: Yes Employment Status: Retired Freight forwarder Issues: No issues Guardian/Conservator: None-according to MD pt is capable of making his own decisions while here. Daughter and son in-law take turns staying here with him, don't him to be alone   Abuse/Neglect Abuse/Neglect Assessment Can Be Completed: Yes Physical Abuse: Denies Verbal Abuse: Denies Sexual Abuse: Denies Exploitation of patient/patient's resources: Denies Self-Neglect: Denies  Emotional Status Pt's affect, behavior adn adjustment status: Pt is tired from therapies today and falling asleep while this worker was talking to his daughter.  She voiced the issues with this hospitalization and not what they expected and how short the length of stay should have been. She feels this is not her father at all and he is different from the hospitaliization and all that has happened to him while here. Recent Psychosocial Issues: other health issues were being managed until this surgery Pyschiatric History: No history deferred depression screen due to not able to stay awake long enough to complete. Daughter voiced he is one who does well and works hard to achieve his goals. Will continue to re-assess when more awake. Substance Abuse History: No issues  Patient / Family Perceptions, Expectations & Goals Pt/Family understanding of illness & functional limitations: Pt and daughter have spoken with the MD and daughter keeps record of every conversation with staff. She is up on his medicines and appointments. She or her  husband will be here daily and advocate for her father. Premorbid pt/family roles/activities: Father, retiree, church member, etc Anticipated changes in roles/activities/participation: resume Pt/family expectations/goals: Pt states: " I need to get stronger while I'm here."  Daughter states: " This is not him, he was doing everything before this all happened to him."  [employer]: Other (Comment)(has hired assist in past for Mom) Premorbid Home Care/DME Agencies: Other (Comment)(has equipment from previous admits) Transportation available at discharge: Daughter and son in-law Resource referrals recommended: Neuropsychology, Support group (specify)  Discharge Planning Living Arrangements: Alone Support Systems: Children, Other relatives, Friends/neighbors Type of Residence: Private residence Insurance Resources: [employer], Multimedia programmer (specify)(BCBS) Financial Resources: Radio broadcast assistant Screen Referred: No Living Expenses: Own Money Management: Patient, Family Does the patient have any problems obtaining your medications?: No Home Management: Hired assist for cleaning Patient/Family Preliminary Plans: Return home with either family providing care versus hired assist. Daughter feels she will be ultimately responsible due to worries when she is not with him. Daughter and son in-law have a trip planned for 12/19 unsure if can go now.  Social Work Anticipated Follow Up Needs: HH/OP, Support Group  Clinical Impression Pleasant gentleman who is willing to do well here and work hard. Deconditioned since surgery and medical issues resulting from  complications. Was living alone but will now need assistance upon discharge from here. Daughter feels this is wrong and he should be back to the level he walked into hospital as. She is very unhappy with how her Dad has been taken care of and his functional level now. Will work with on a safe plan for him since he  can not return home alone.  Elease Hashimoto 11/30/2017, 3:00 PM

## 2017-11-30 NOTE — Evaluation (Signed)
Occupational Therapy Assessment and Plan  Patient Details  Name: [name] MRN: [id number] Date of Birth: 22-Oct-1927  OT Diagnosis: abnormal posture, altered mental status and muscle weakness (generalized) Rehab Potential: Rehab Potential (ACUTE ONLY): Good ELOS: 12-14 days   Today's Date: 11/30/2017 OT Individual Time: 1100-1206 OT Individual Time Calculation (min): 66 min     Problem List:  Patient Active Problem List   Diagnosis Date Noted  . Hypoalbuminemia due to protein-calorie malnutrition ([place])   . Benign essential HTN   . Thrombocytopenia ([place])   . Anemia of chronic disease   . Chronic kidney disease (CKD), stage IV (severe) ([place])   . Debility 11/29/2017  . Leaking abdominal aortic aneurysm (AAA) ([place]) 11/24/2017  . Pressure injury of skin 11/23/2017  . Parkinsonism ([place]) 07/10/2017  . Status post dilatation of esophageal stricture 05/15/2017  . Pancytopenia ([place]) 03/31/2016  . Follow-up --- PCP NOTES 09/02/2015  . Neuropathy 06/10/2015  . Pedal edema 01/30/2015  . Hip pain, chronic 01/30/2015  . Back pain 01/30/2015  . Cramps of lower extremity 01/30/2015  . Aftercare following surgery of the circulatory system, [place] 08/05/2014  . Annual physical exam 10/19/2011  . DJD (degenerative joint disease) 12/14/2010  . Anemia 06/06/2009  . COPD (chronic obstructive pulmonary disease) ([place]) 06/06/2009  . RENAL CELL CANCER 12/25/2008  . HTN (hypertension) 12/25/2008  . AAA (abdominal aortic aneurysm) without rupture ([place]) 12/25/2008  . CKD (chronic kidney disease) 12/25/2008  . HYPERCHOLESTEROLEMIA 03/01/2008  . GERD 03/01/2008  . ACNE ROSACEA 11/29/2007  . DIVERTICULOSIS, COLON 04/12/2007    Past Medical History:  Past Medical History:  Diagnosis Date  . Abdominal aortic aneurysm ([place]) 09/2008   s/o endovascular repair and angiogram 10/04/08   . Anemia    EGD Cscope 08-2012  . Arthritis    "hips" (11/23/2017)  . Atrial fibrillation ([place])   . Bleeding  stomach ulcer 1990s  . Bleeding ulcer 1997  . CKD (chronic kidney disease), stage IV (HCC)    Stable, Dr. Dimas Aguas  . COPD (chronic obstructive pulmonary disease) ([place])    "mild" (11/23/2017)  . Dementia    "on very small dose of Aricept" (11/23/2017)  . H/O hyperkalemia    Stable  . Hypertension   . Leukopenia 2/11   thought to be from doxy  . Pneumonia 2018 X 2  . Renal cell carcinoma    clear cell type dx 10/09, s/p excision 10-24-08  . Renal insufficiency   . Solitary left kidney    W/ chronic tubule-interstitial damage, Dr. Dimas Aguas  . Spinal stenosis    causing LE weakness-persistant, being worked up by Hydrographic surveyor and neurologist   Past Surgical History:  Past Surgical History:  Procedure Laterality Date  . ABDOMINAL AORTIC ANEURYSM REPAIR  2009   EVAR  . CATARACT EXTRACTION W/ INTRAOCULAR LENS  IMPLANT, BILATERAL Bilateral   . HEMORRHOID SURGERY  1990  . IR ANGIOGRAM PELVIS SELECTIVE OR SUPRASELECTIVE  11/24/2017  . IR ANGIOGRAM SELECTIVE EACH ADDITIONAL VESSEL  11/24/2017  . IR ANGIOGRAM SELECTIVE EACH ADDITIONAL VESSEL  11/24/2017  . IR ANGIOGRAM SELECTIVE EACH ADDITIONAL VESSEL  11/24/2017  . IR ANGIOGRAM SELECTIVE EACH ADDITIONAL VESSEL  11/24/2017  . IR ANGIOGRAM VISCERAL SELECTIVE  11/24/2017  . IR ANGIOGRAM VISCERAL SELECTIVE  11/24/2017  . IR RADIOLOGIST EVAL & MGMT  09/07/2017  . IR US GUIDE VASC ACCESS RIGHT  11/24/2017  . [place] (lip), 2004  . NEPHRECTOMY  2009   Open  nephrectomy for cancer    Assessment & Plan Clinical Impression: Patient is a 83 y.o. year old male with recent admission to the hospital on 11/23/2017 after seeing his nephrologist for possible AAA endoleak repair by interventional radiology. 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 Postprocedure mildly hypoxic. Chest x-ray showed right lower lobe consolidation versus  atelectasis. Patient remained on oxygen therapy and follow-up her pulmonary services. Patient recovered nicely and pulmonary services signed off 11/28/2017. Renal services follow-up for CKD stage III with elevated creatinine. Renal ultrasound unremarkable left kidney. Latest creatinine 3.02 with expected slow recovery. On 11/28/2017 cardiology services consulted for new onset atrial fibrillation with slow ventricular response felt to be exacerbated by his current medical illness. Patient had no chest pain or shortness of breath. Echocardiogram completed 11/28/2017 showing ejection fraction of 60% no wall motion abnormalities.  Patient transferred to CIR on 11/29/2017 .    Patient currently requires max with basic self-care skills secondary to muscle weakness, decreased initiation, decreased attention, decreased problem solving, decreased memory and delayed processing and decreased sitting balance, decreased standing balance, decreased postural control and decreased balance strategies.  Prior to hospitalization, patient could complete ADLs with modified independent .  Patient will benefit from skilled intervention to decrease level of assist with basic self-care skills and increase independence with basic self-care skills prior to discharge home with care partner.  Anticipate patient will require 24 hour supervision and minimal physical assistance and follow up home health.  OT - End of Session Activity Tolerance: Tolerates 10 - 20 min activity with multiple rests Endurance Deficit: Yes(pt reporting fatigue during sitting with ADL tasks) OT Assessment Rehab Potential (ACUTE ONLY): Good OT Patient demonstrates impairments in the following area(s): Balance;Cognition;Endurance OT Basic ADL's Functional Problem(s): Eating;Grooming;Bathing;Toileting OT Transfers Functional Problem(s): Toilet OT Plan OT Intensity: Minimum of 1-2 x/day, 45 to 90 minutes OT Frequency: 5 out of 7 days OT Duration/Estimated  Length of Stay: 12-14 days OT Treatment/Interventions: Balance/vestibular training;Cognitive remediation/compensation;DME/adaptive equipment instruction;Discharge planning;Self Care/advanced ADL retraining;Pain management;Therapeutic Activities;UE/LE Coordination activities;Therapeutic Exercise;Patient/family education;Functional mobility training;Neuromuscular re-education;UE/LE Strength taining/ROM OT Self Feeding Anticipated Outcome(s): modified independent OT Basic Self-Care Anticipated Outcome(s): supervision to min assist OT Toileting Anticipated Outcome(s): supervision to min assist OT Bathroom Transfers Anticipated Outcome(s): supervision to min assist  OT Recommendation Patient destination: Home Follow Up Recommendations: Home health OT;24 hour supervision/assistance Equipment Recommended: None recommended by OT   Skilled Therapeutic Intervention Pt in room with daughter during session.  Completed bathing and dressing sit to stand at the sink.  Pt with delayed initiation to command at times with regards to bathing parts of his body.  Overall max demonstrational cueing with mod to max assist for bathing both his UB and LB thoroughly.  Daughter reports at home he uses personal bathing cloths (which he has in his room her as well) to wash off and did not shower at home.  Family would assist with a bath on the weekend or one time a week and assist him with washing his hair as well.  Max assist for initiation of dressing tasks as well.  When pt was given a shirt and told to put it on he could not initiate the task.  When asked what the therapist had told him, he stated "I don't remember."  Mod assist for initial sit to stand but needed max on the last attempt.  Pt maintains flexed posture in sitting and in standing with LOB posteriorly.  Therapist assisted with all LB dressing and discussed expectations of progress with pt and daughter to finish session.  Encouraged them to expect 24 hour  supervision/ min assist would be needed at discharge.  She voiced understand and continues to hope he can regain his independence.    OT Evaluation Precautions/Restrictions  Precautions Precautions: Fall Restrictions Weight Bearing Restrictions: No  Pain Pain Assessment Pain Assessment: No/denies pain Home Living/Prior Functioning Home Living Family/patient expects to be discharged to:: Private residence Living Arrangements: Alone Available Help at Discharge: Family(daughter and son-in law can provide 24 hour supervisision if needed) Type of Home: House Home Access: Stairs to enter [employer] of Steps: 3 inch step/threshold to get in Home Layout: One level Bathroom Shower/Tub: Walk-in shower(pt sponge bathes or uses wipes) Bathroom Toilet: Handicapped height  Lives With: Alone IADL History Homemaking Responsibilities: Yes Meal Prep Responsibility: Secondary Current License: No Occupation: Retired Prior Function Level of Independence: Independent with basic ADLs, Independent with homemaking with ambulation, Independent with transfers  Able to Take Stairs?: Yes Driving: No Vocation: Retired Leisure: Hobbies-yes (Comment) Comments: pt's daughter drives him wherever he needs to go. Pt was receiving OP therapy services 2x/week. Has a [employer] and would walk on treadmill.  ADL  See Function Section for details  Vision Baseline Vision/History: No visual deficits(Just recently got glasses but hasn't been wearing them) Patient Visual Report: No change from baseline Additional Comments: Will look at further during function. Perception  Perception: Within Functional Limits Praxis Praxis: Intact Cognition Overall Cognitive Status: History of cognitive impairments - at baseline Arousal/Alertness: Awake/alert Orientation Level: Person;Place Year: 2018 Month: November Day of Week: Correct Memory: Impaired Memory Impairment: Storage deficit;Decreased  recall of new information Immediate Memory Recall: Sock;Blue;Bed Memory Recall: (None recalled even with cues) Attention: Sustained Sustained Attention: Impaired Sustained Attention Impairment: Verbal basic Awareness: Impaired Problem Solving: Impaired Executive Function: Initiating Safety/Judgment: Appears intact Comments: Pt with delayed cognitive processing of command with regards to simple selfcare tasks such as bathing and dressing.  Needed max assist for completion of ADl tasks. Sensation Sensation Light Touch: Appears Intact Stereognosis: Not tested Hot/Cold: Not tested Proprioception: Appears Intact Additional Comments: B LE sensation appears intact Coordination Gross Motor Movements are Fluid and Coordinated: No Fine Motor Movements are Fluid and Coordinated: No Coordination and Movement Description: Slow UE movements noted during selfcare tasks.  Limited AROM in bilateral shoulders secondary to postural changes.   Heel Shin Test: slow and uncoordinated Motor  Motor Motor: Abnormal postural alignment and control Motor - Skilled Clinical Observations: generalized weakness, decreased ability to advance LEs efficiently during transfers Mobility  Transfers Transfers: Sit to Stand;Stand to Sit Sit to Stand: 2: Max assist;With upper extremity assist;With armrests;From chair/3-in-1 Stand to Sit: 2: Max assist;With armrests;With upper extremity assist;To chair/3-in-1  Trunk/Postural Assessment  Cervical Assessment Cervical Assessment: Exceptions to WFL(cervical flexion and protraction at rest) Thoracic Assessment Thoracic Assessment: Exceptions to WFL(moderate thoracic kyphosis) Lumbar Assessment Lumbar Assessment: Exceptions to WFL(increased lumbar flexion in sitting) Postural Control Postural Control: Deficits on evaluation Postural Limitations: posterior and left lean during bathing tasks from the wheelchair.  Flexed trunk and head in standing.  Unable to achieve full  upright posture in standing  Balance Balance Balance Assessed: Yes Static Sitting Balance Static Sitting - Balance Support: No upper extremity supported Static Sitting - Level of Assistance: 4: Min assist Dynamic Sitting Balance Dynamic Sitting - Balance Support: During functional activity Dynamic Sitting - Level of Assistance: 3: Mod assist Static Standing Balance Static  Standing - Balance Support: During functional activity Static Standing - Level of Assistance: 3: Mod assist Dynamic Standing Balance Dynamic Standing - Balance Support: During functional activity Dynamic Standing - Level of Assistance: 2: Max assist Extremity/Trunk Assessment RUE Assessment RUE Assessment: Exceptions to [facility] shoulder flexion 0-90 degrees seconddary to posture.  AROM elbow and hand WFLS with overall strength 3+/5) LUE Assessment LUE Assessment: Exceptions to [facility] shoulder flexion 0-90 degrees seconddary to posture.  AROM elbow and hand WFLS with overall strength 3+/5)   See Function Navigator for Current Functional Status.   Refer to Care Plan for Long Term Goals  Recommendations for other services: None    Discharge Criteria: Patient will be discharged from OT if patient refuses treatment 3 consecutive times without medical reason, if treatment goals not met, if there is a change in medical status, if patient makes no progress towards goals or if patient is discharged from hospital.  The above assessment, treatment plan, treatment alternatives and goals were discussed and mutually agreed upon: by patient and by family  Ovadia Lopp OTR/L 11/30/2017, 1:48 PM

## 2017-11-30 NOTE — Evaluation (Signed)
Speech Language Pathology Assessment and Plan  Patient Details  Name: [name] MRN: [id number] Date of Birth: 18-Jan-1927  SLP Diagnosis: Cognitive Impairments;Dysphagia  Rehab Potential: Good ELOS: 16-18 days     Today's Date: 11/30/2017 SLP Individual Time: 810-900 / 1220-1230 ( 60 mins)     Problem List:  Patient Active Problem List   Diagnosis Date Noted  . Hypoalbuminemia due to protein-calorie malnutrition ([place])   . Benign essential HTN   . Thrombocytopenia ([place])   . Anemia of chronic disease   . Chronic kidney disease (CKD), stage IV (severe) ([place])   . Debility 11/29/2017  . Leaking abdominal aortic aneurysm (AAA) ([place]) 11/24/2017  . Pressure injury of skin 11/23/2017  . Parkinsonism ([place]) 07/10/2017  . Status post dilatation of esophageal stricture 05/15/2017  . Pancytopenia ([place]) 03/31/2016  . Follow-up --- PCP NOTES 09/02/2015  . Neuropathy 06/10/2015  . Pedal edema 01/30/2015  . Hip pain, chronic 01/30/2015  . Back pain 01/30/2015  . Cramps of lower extremity 01/30/2015  . Aftercare following surgery of the circulatory system, [place] 08/05/2014  . Annual physical exam 10/19/2011  . DJD (degenerative joint disease) 12/14/2010  . Anemia 06/06/2009  . COPD (chronic obstructive pulmonary disease) ([place]) 06/06/2009  . RENAL CELL CANCER 12/25/2008  . HTN (hypertension) 12/25/2008  . AAA (abdominal aortic aneurysm) without rupture ([place]) 12/25/2008  . CKD (chronic kidney disease) 12/25/2008  . HYPERCHOLESTEROLEMIA 03/01/2008  . GERD 03/01/2008  . ACNE ROSACEA 11/29/2007  . DIVERTICULOSIS, COLON 04/12/2007   Past Medical History:  Past Medical History:  Diagnosis Date  . Abdominal aortic aneurysm ([place]) 09/2008   s/o endovascular repair and angiogram 10/04/08   . Anemia    EGD Cscope 08-2012  . Arthritis    "hips" (11/23/2017)  . Atrial fibrillation ([place])   . Bleeding stomach ulcer 1990s  . Bleeding ulcer 1997  . CKD (chronic kidney disease), stage IV (HCC)     Stable, Dr. Dimas Aguas  . COPD (chronic obstructive pulmonary disease) ([place])    "mild" (11/23/2017)  . Dementia    "on very small dose of Aricept" (11/23/2017)  . H/O hyperkalemia    Stable  . Hypertension   . Leukopenia 2/11   thought to be from doxy  . Pneumonia 2018 X 2  . Renal cell carcinoma    clear cell type dx 10/09, s/p excision 10-24-08  . Renal insufficiency   . Solitary left kidney    W/ chronic tubule-interstitial damage, Dr. Dimas Aguas  . Spinal stenosis    causing LE weakness-persistant, being worked up by Hydrographic surveyor and neurologist   Past Surgical History:  Past Surgical History:  Procedure Laterality Date  . ABDOMINAL AORTIC ANEURYSM REPAIR  2009   EVAR  . CATARACT EXTRACTION W/ INTRAOCULAR LENS  IMPLANT, BILATERAL Bilateral   . HEMORRHOID SURGERY  1990  . IR ANGIOGRAM PELVIS SELECTIVE OR SUPRASELECTIVE  11/24/2017  . IR ANGIOGRAM SELECTIVE EACH ADDITIONAL VESSEL  11/24/2017  . IR ANGIOGRAM SELECTIVE EACH ADDITIONAL VESSEL  11/24/2017  . IR ANGIOGRAM SELECTIVE EACH ADDITIONAL VESSEL  11/24/2017  . IR ANGIOGRAM SELECTIVE EACH ADDITIONAL VESSEL  11/24/2017  . IR ANGIOGRAM VISCERAL SELECTIVE  11/24/2017  . IR ANGIOGRAM VISCERAL SELECTIVE  11/24/2017  . IR RADIOLOGIST EVAL & MGMT  09/07/2017  . IR US GUIDE VASC ACCESS RIGHT  11/24/2017  . [place] ([place]), 2004  . NEPHRECTOMY  2009   Open nephrectomy for cancer    Assessment / Plan /  Recommendation Clinical Impression [name] is a 83 y.o. right handed male with history significant for CKD stage IV, dementia, parkinsonian maintained on Sinemet, AAA with prior endovascular repair and angiogram 2009, renal cell carcinoma solitary left kidney, hypertension, COPD, chronic anemia with pancytopenia. Per chart review and daughter, patient lives alone and independent. He does not drive. Family checks on him as needed. One level home. Presented 11/23/2017 after seeing his nephrologist for possible  AAA endoleak repair by interventional radiology. 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 Postprocedure mildly hypoxic. Chest x-ray showed right lower lobe consolidation versus atelectasis. Patient remained on oxygen therapy and follow-up her pulmonary services. Patient recovered nicely and pulmonary services signed off 11/28/2017. Renal services follow-up for CKD stage III with elevated creatinine. Renal ultrasound unremarkable left kidney. Latest creatinine 3.02 with expected slow recovery. On 11/28/2017 cardiology services consulted for new onset atrial fibrillation with slow ventricular response felt to be exacerbated by his current medical illness. Patient had no chest pain or shortness of breath. Echocardiogram completed 11/28/2017 showing ejection fraction of 60% no wall motion abnormalities. Patient not felt to be a Coumadin candidate due to fall risk as well with noted history of pancytopenia Physical and occupational therapy evaluations completed with recommendations of physical medicine rehabilitation consult. Patient was admitted for a comprehensive rehabilitation program   Pt admitted to CIR on 11/29/2017 and evaluated for cognitive- lingustic and swallow skills on 11/30/2017. Pt presented with severe-moderate impairment in cognitive linguistic ability, specifically pertaining to auditory comprehension, basic problem solving, orientation, recall of new information, limited expression word/phrase level, word finding which is further impacted by deficits in sustained attention. Skilled observed of cognitive linguistic impairments is further supported by score of 6 out of 30 (n=>26.) Pt presented with mild oral dysphagia including deficits in prolonged mastication, oral holding and oral residue, further impacted by attention deficits. Pt demonstrated po consumption with thin liquids via straw given 3  consecutive sips and cup sips, dys 2,dys 3 and regular textured foods, no overt s/s aspiration, besides premorbid cough due to respiratory deficits. SLP recommend dys 2 and thin liquid diet with full supervision, with goals to address solid advancement and reduce supervision. Pt would benefit from skilled ST services in order to maximize functional independence and reduce burden of care with 24 hour supervision most likely recommended.   Skilled Therapeutic Interventions           Skilled ST services focused on swallow skills. SLP facilitated PO consumption of regular textured foods, dys 3 and dy 2 textured foods, pt demonstrated no overt s/s aspiration, besides baseline cough.Pt required mod A verbal cues to clear oral residue with liquid wash and prolonged mastication. Pt demonstrated reduced sustained attention during PO consumption and difficulty locating items. SLP discussed diet downgrade to dys 2 with pt and daughter, both agreed in order to conserve energy and reduce risk of aspiration. Pt was left in room with call bell within reach.     SLP Assessment  Patient will need skilled Speech Lanaguage Pathology Services during CIR admission    Recommendations  SLP Diet Recommendations: Dysphagia 2 (Fine chop);Thin Liquid Administration via: Cup;Straw Medication Administration: Crushed with puree Supervision: Patient able to self feed;Full supervision/cueing for compensatory strategies(oral residue,liquid wash) Compensations: Minimize environmental distractions;Slow rate;Small sips/bites;Lingual sweep for clearance of pocketing;Follow solids with liquid Postural Changes and/or Swallow Maneuvers: Seated upright 90 degrees Oral Care Recommendations: Oral care BID Patient destination: Home  Follow up Recommendations: Home Health SLP;Outpatient SLP;24 hour supervision/assistance Equipment Recommended: None recommended by SLP    SLP Frequency 3 to 5 out of 7 days   SLP Duration  SLP  Intensity  SLP Treatment/Interventions 16-18 days   Minumum of 1-2 x/day, 30 to 90 minutes  Cognitive remediation/compensation;Cueing hierarchy;Environmental controls;Functional tasks;Dysphagia/aspiration precaution training;Patient/family education    Pain Pain Assessment Pain Assessment: No/denies pain  Prior Functioning Cognitive/Linguistic Baseline: Baseline deficits(takes care of basic needs, family assist with higher function needs according to daughter) Type of Home: House  Lives With: Alone Available Help at Discharge: Family Vocation: Retired  Function:  Eating Eating   Modified Consistency Diet: No Eating Assist Level: Set up assist for;More than reasonable amount of time;Supervision or verbal cues;Helper checks for pocketed food   Eating Set Up Assist For: Opening containers       Cognition Comprehension Comprehension assist level: Understands basic 25 - 49% of the time/ requires cueing 50 - 75% of the time  Expression   Expression assist level: Expresses basic 50 - 74% of the time/requires cueing 25 - 49% of the time. Needs to repeat parts of sentences.  Social Interaction Social Interaction assist level: Interacts appropriately 50 - 74% of the time - May be physically or verbally inappropriate.  Problem Solving Problem solving assist level: Solves basic 25 - 49% of the time - needs direction more than half the time to initiate, plan or complete simple activities  Memory Memory assist level: Recognizes or recalls less than 25% of the time/requires cueing greater than 75% of the time   Short Term Goals: Week 1: SLP Short Term Goal 1 (Week 1): Pt will demonstrate sustained attention to functional tasks for 5 mintues with Max verbal cues. SLP Short Term Goal 2 (Week 1): Pt will demonstrate follow 1 step commands in 80% of opportunies with Max A verbal cues. SLP Short Term Goal 3 (Week 1): Pt will complete functional problem solving for basic and familar tasks with  Max A multimodal cues. SLP Short Term Goal 4 (Week 1): Pt will utilize external memory aids to recall new, daily information with Max A verbal and question cues. SLP Short Term Goal 5 (Week 1): Pt will identify 2 congitive and 2 phsyical deficits with Mod A multimodal cues.  SLP Short Term Goal 6 (Week 1): Pt will consume trials of dys 3 textured foods with effective mastication and oral clearance with min overt s/s aspriation and Min A verbal cues to follow swallow strategies.  Refer to Care Plan for Long Term Goals  Recommendations for other services: None   Discharge Criteria: Patient will be discharged from SLP if patient refuses treatment 3 consecutive times without medical reason, if treatment goals not met, if there is a change in medical status, if patient makes no progress towards goals or if patient is discharged from hospital.  The above assessment, treatment plan, treatment alternatives and goals were discussed and mutually agreed upon: by patient and by family  Kimberely Mccannon  [facility] 11/30/2017, 4:59 PM

## 2017-11-30 NOTE — Evaluation (Signed)
Physical Therapy Assessment and Plan  Patient Details  Name: [name] MRN: [id number] Date of Birth: 03/10/1927  PT Diagnosis: Abnormal posture, Difficulty walking and Muscle weakness Rehab Potential: Good ELOS: 14-16 days   Today's Date: 11/30/2017 PT Individual Time: 0904-1001, 2751-7001 PT Individual Time Calculation (min): 57 min , 45 min  Problem List:  Patient Active Problem List   Diagnosis Date Noted  . Hypoalbuminemia due to protein-calorie malnutrition ([place])   . Benign essential HTN   . Thrombocytopenia ([place])   . Anemia of chronic disease   . Chronic kidney disease (CKD), stage IV (severe) ([place])   . Debility 11/29/2017  . Leaking abdominal aortic aneurysm (AAA) ([place]) 11/24/2017  . Pressure injury of skin 11/23/2017  . Parkinsonism ([place]) 07/10/2017  . Status post dilatation of esophageal stricture 05/15/2017  . Pancytopenia ([place]) 03/31/2016  . Follow-up --- PCP NOTES 09/02/2015  . Neuropathy 06/10/2015  . Pedal edema 01/30/2015  . Hip pain, chronic 01/30/2015  . Back pain 01/30/2015  . Cramps of lower extremity 01/30/2015  . Aftercare following surgery of the circulatory system, [place] 08/05/2014  . Annual physical exam 10/19/2011  . DJD (degenerative joint disease) 12/14/2010  . Anemia 06/06/2009  . COPD (chronic obstructive pulmonary disease) ([place]) 06/06/2009  . RENAL CELL CANCER 12/25/2008  . HTN (hypertension) 12/25/2008  . AAA (abdominal aortic aneurysm) without rupture ([place]) 12/25/2008  . CKD (chronic kidney disease) 12/25/2008  . HYPERCHOLESTEROLEMIA 03/01/2008  . GERD 03/01/2008  . ACNE ROSACEA 11/29/2007  . DIVERTICULOSIS, COLON 04/12/2007    Past Medical History:  Past Medical History:  Diagnosis Date  . Abdominal aortic aneurysm ([place]) 09/2008   s/o endovascular repair and angiogram 10/04/08   . Anemia    EGD Cscope 08-2012  . Arthritis    "hips" (11/23/2017)  . Atrial fibrillation ([place])   . Bleeding stomach ulcer 1990s  . Bleeding ulcer  1997  . CKD (chronic kidney disease), stage IV (H[facility])    Stable, Dr. Dimas Aguas  . COPD (chronic obstructive pulmonary disease) ([place])    "mild" (11/23/2017)  . Dementia    "on very small dose of Aricept" (11/23/2017)  . H/O hyperkalemia    Stable  . Hypertension   . Leukopenia 2/11   thought to be from doxy  . Pneumonia 2018 X 2  . Renal cell carcinoma    clear cell type dx 10/09, s/p excision 10-24-08  . Renal insufficiency   . Solitary left kidney    W/ chronic tubule-interstitial damage, Dr. Dimas Aguas  . Spinal stenosis    causing LE weakness-persistant, being worked up by Hydrographic surveyor and neurologist   Past Surgical History:  Past Surgical History:  Procedure Laterality Date  . ABDOMINAL AORTIC ANEURYSM REPAIR  2009   EVAR  . CATARACT EXTRACTION W/ INTRAOCULAR LENS  IMPLANT, BILATERAL Bilateral   . HEMORRHOID SURGERY  1990  . IR ANGIOGRAM PELVIS SELECTIVE OR SUPRASELECTIVE  11/24/2017  . IR ANGIOGRAM SELECTIVE EACH ADDITIONAL VESSEL  11/24/2017  . IR ANGIOGRAM SELECTIVE EACH ADDITIONAL VESSEL  11/24/2017  . IR ANGIOGRAM SELECTIVE EACH ADDITIONAL VESSEL  11/24/2017  . IR ANGIOGRAM SELECTIVE EACH ADDITIONAL VESSEL  11/24/2017  . IR ANGIOGRAM VISCERAL SELECTIVE  11/24/2017  . IR ANGIOGRAM VISCERAL SELECTIVE  11/24/2017  . IR RADIOLOGIST EVAL & MGMT  09/07/2017  . IR US GUIDE VASC ACCESS RIGHT  11/24/2017  . [place] 2004  . NEPHRECTOMY  2009   Open nephrectomy for cancer  Assessment & Plan Clinical Impression: Patient is a 83 y.o. year old male with history significant for CKD stage IV, dementia, parkinsonian maintained on Sinemet, AAA with prior endovascular repair and angiogram 2009, renal cell carcinoma solitary left kidney, hypertension, COPD, chronic anemia with pancytopenia. Per chart review and daughter, patient lives alone and independent. He does not drive. Family checks on him as needed. One level home. Presented 11/23/2017 after seeing  his nephrologist for possible AAA endoleak repair by interventional radiology. 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 Postprocedure mildly hypoxic. Chest x-ray showed right lower lobe consolidation versus atelectasis. Patient remained on oxygen therapy and follow-up her pulmonary services. Patient recovered nicely and pulmonary services signed off 11/28/2017. Renal services follow-up for CKD stage III with elevated creatinine. Renal ultrasound unremarkable left kidney. Latest creatinine 3.02 with expected slow recovery. On 11/28/2017 cardiology services consulted for new onset atrial fibrillation with slow ventricular response felt to be exacerbated by his current medical illness. Patient had no chest pain or shortness of breath. Echocardiogram completed 11/28/2017 showing ejection fraction of 60% no wall motion abnormalities. Patient not felt to be a Coumadin candidate due to fall risk as well with noted history of pancytopenia Physical and occupational therapy evaluations completed with recommendations of physical medicine rehabilitation consult. Patient was admitted for a comprehensive rehabilitation program. Patient transferred to CIR on 11/29/2017 .   Patient currently requires mod with mobility secondary to muscle weakness and decreased standing balance, decreased postural control and decreased balance strategies.  Prior to hospitalization, patient was modified independent  with mobility and lived with Alone in a House home.  Home access is 3 inch step/threshold to get inStairs to enter.  Patient will benefit from skilled PT intervention to maximize safe functional mobility, minimize fall risk and decrease caregiver burden for planned discharge home with 24 hour supervision.  Anticipate patient will benefit from follow up OP at discharge.  PT - End of Session Activity Tolerance: Decreased this  session;Tolerates 10 - 20 min activity with multiple rests Endurance Deficit: Yes PT Assessment Rehab Potential (ACUTE/IP ONLY): Good PT Patient demonstrates impairments in the following area(s): Balance;Endurance;Motor;Sensory PT Transfers Functional Problem(s): Bed Mobility;Bed to Chair;Car PT Locomotion Functional Problem(s): Ambulation;Wheelchair Mobility;Stairs PT Plan PT Intensity: Minimum of 1-2 x/day ,45 to 90 minutes PT Frequency: 5 out of 7 days PT Duration Estimated Length of Stay: 14-16 days PT Treatment/Interventions: Ambulation/gait training;Stair training;Balance/vestibular training;DME/adaptive equipment instruction;Patient/family education;Therapeutic Activities;Wheelchair propulsion/positioning;Cognitive remediation/compensation;Therapeutic Exercise;Community reintegration;Functional mobility training;UE/LE Strength taining/ROM;Discharge planning;Neuromuscular re-education;UE/LE Coordination activities PT Transfers Anticipated Outcome(s): supervision  PT Locomotion Anticipated Outcome(s): supervision  PT Recommendation Follow Up Recommendations: Outpatient PT Equipment Recommended: To be determined  Skilled Therapeutic Intervention Session 1: Evaluation completed (see details above and below) with education on PT POC and goals and individual treatment initiated with focus on functional transfers, gait, balance and sit<>stands. Pt supine in bed and agreeable to therapy tx, daughter present for entire session. Pt transferred from supine>sittng EOB with min assist and increased time. Pt performed sit<>stand from bed with mod assist using RW for UE support, posterior lean noted and pt unable to correct without manual facilitation. Pt performed stand pivot transfer from bed<>w/c using RW and mod assist. Pt transported to gym in w/c. Pt ambulated x 20 ft using RW and max assist to correct posterior lean. Pt very slow with movements, increased time to complete and small shuffled gait.  Pt propelled w/c x 50 ft with supervision  using B UEs, limited by fatigue. Transported back to room and left seated in w/c with needs in reach and daughter present.   Session 2: Pt seated in w/c upon PT arrival, agreeable to therapy tx and denies pain. Pt transported to gym in w/c. Pt performed x 3 sit<>stands working on technique with mod assist to power up. Once in standing worked on static standing balance with and without UE support in order to minimize posterior lean, pt able to maintain balance without support 5-10 sec at a time. Pt reported needing to use the bathroom. Pt transported back to room in w/c. Pt performed stand pivot transfers from w/c<>commode over toilet max assist, verbal cues for techniques, max assist for clothing management. Pt transferred from w/c>bed mod assist stand pivot. Pt transferred sitting>supine with mod assist. Pt left supine in bed with needs in reach. Therapist also swapped out w/c for higher seat to floor height in order to make sit<>stands easier.    PT Evaluation Precautions/Restrictions Precautions Precautions: Fall Restrictions Weight Bearing Restrictions: No General   Vital Signs Pain  denies pain Home Living/Prior Functioning Home Living Type of Home: House Home Access: Stairs to enter Entrance Stairs-Number of Steps: 3 inch step/threshold to get in Home Layout: One level  Lives With: Alone Prior Function Level of Independence: Independent with basic ADLs;Independent with homemaking with ambulation;Independent with transfers  Able to Take Stairs?: Yes Driving: No Vocation: Retired Leisure: Hobbies-yes (Comment) Comments: pt's daughter drives him wherever he needs to go. Pt was receiving OP therapy services 2x/week. Has a [employer] and would walk on treadmill.  Cognition Overall Cognitive Status: History of cognitive impairments - at baseline Arousal/Alertness: Awake/alert Orientation Level: Oriented X4 Attention:  Sustained Sustained Attention: Impaired Memory: Impaired Awareness: Appears intact Problem Solving: Appears intact Safety/Judgment: Appears intact Sensation Sensation Light Touch: Appears Intact Proprioception: Appears Intact Additional Comments: B LE sensation appears intact Coordination Gross Motor Movements are Fluid and Coordinated: No Fine Motor Movements are Fluid and Coordinated: No Coordination and Movement Description: Slow LE gross movement Heel Shin Test: slow and uncoordinated Motor  Motor Motor - Skilled Clinical Observations: generalized weakness  Trunk/Postural Assessment  Cervical Assessment Cervical Assessment: Exceptions to WFL(forward head posture) Thoracic Assessment Thoracic Assessment: Exceptions to WFL(kyphotic) Lumbar Assessment Lumbar Assessment: Exceptions to WFL(posterior pelvic tilt) Postural Control Postural Control: Deficits on evaluation Postural Limitations: posterior lean  Balance Balance Balance Assessed: Yes Static Sitting Balance Static Sitting - Level of Assistance: 5: Stand by assistance Dynamic Sitting Balance Dynamic Sitting - Level of Assistance: 4: Min assist Static Standing Balance Static Standing - Level of Assistance: 3: Mod assist Dynamic Standing Balance Dynamic Standing - Level of Assistance: 2: Max assist Extremity Assessment  RLE Assessment RLE Assessment: Exceptions to [facility] RLE Strength Right Hip Flexion: 4-/5 Right Hip Extension: 3+/5 Right Hip ABduction: 4/5 Right Hip ADduction: 4/5 Right Knee Flexion: 4/5 Right Knee Extension: 4/5 Right Ankle Dorsiflexion: 4/5 LLE Assessment LLE Assessment: Exceptions to [facility] LLE Strength Left Hip Flexion: 4/5 Left Hip Extension: 3+/5 Left Hip ABduction: 4/5 Left Hip ADduction: 4/5 Left Knee Flexion: 4/5 Left Knee Extension: 4/5 Left Ankle Dorsiflexion: 4/5   See Function Navigator for Current Functional Status.   Refer to Care Plan for Long Term  Goals  Recommendations for other services: None   Discharge Criteria: Patient will be discharged from PT if patient refuses treatment 3 consecutive times without medical reason, if treatment goals not met, if there is a change in medical status, if  patient makes no progress towards goals or if patient is discharged from hospital.  The above assessment, treatment plan, treatment alternatives and goals were discussed and mutually agreed upon: by patient and by family  Netta Corrigan, PT, DPT 11/30/2017, 12:15 PM

## 2017-11-30 NOTE — IPOC Note (Signed)
Overall Plan of Care [facility]) Patient Details Name: [name] MRN: [id number] DOB: 1927-02-07  Admitting Diagnosis: Debility  Hospital Problems: Principal Problem:   Debility Active Problems:   Pancytopenia ([place])   Parkinsonism ([place])   Hypoalbuminemia due to protein-calorie malnutrition ([place])   Benign essential HTN   Thrombocytopenia (HCC)   Anemia of chronic disease   Chronic kidney disease (CKD), stage IV (severe) (H[facility])     Functional Problem List: Nursing Bladder, Edema, Endurance, Medication Management, Safety, Skin Integrity  PT Balance, Endurance, Motor, Sensory  OT Balance, Cognition, Endurance  SLP Cognition, Safety  TR         Basic ADL's: OT Eating, Grooming, Bathing, Toileting     Advanced  ADL's: OT       Transfers: PT Bed Mobility, Bed to Chair, Car  OT Toilet     Locomotion: PT Ambulation, Emergency planning/management officer, Stairs     Additional Impairments: OT    SLP Swallowing, Communication, Social Cognition expression Problem Solving, Memory, Awareness, Attention  TR      Anticipated Outcomes Item Anticipated Outcome  Self Feeding modified independent  Swallowing  Mod I    Basic self-care  supervision to min assist  Toileting  supervision to min assist   Bathroom Transfers supervision to min assist   Bowel/Bladder  Will be continent of bowel and bladder. Mod I  Transfers  supervision   Locomotion  supervision   Communication  Supervision  Cognition  Supervision  Pain  No pain or less than 2  Safety/Judgment  Will remain free of falls, infection,  and skin breakdown while on rehab.   Therapy Plan: PT Intensity: Minimum of 1-2 x/day ,45 to 90 minutes PT Frequency: 5 out of 7 days PT Duration Estimated Length of Stay: 14-16 days OT Intensity: Minimum of 1-2 x/day, 45 to 90 minutes OT Frequency: 5 out of 7 days OT Duration/Estimated Length of Stay: 12-14 days SLP Intensity: Minumum of 1-2 x/day, 30 to 90 minutes SLP Frequency: 3 to  5 out of 7 days SLP Duration/Estimated Length of Stay: 16-18 days     Team Interventions: Nursing Interventions Patient/Family Education, Bladder Management, Medication Management, Skin Care/Wound Management, Discharge Planning  PT interventions Ambulation/gait training, Stair training, Training and development officer, DME/adaptive equipment instruction, Patient/family education, Therapeutic Activities, Wheelchair propulsion/positioning, Cognitive remediation/compensation, Therapeutic Exercise, Community reintegration, Functional mobility training, UE/LE Strength taining/ROM, Discharge planning, Neuromuscular re-education, UE/LE Coordination activities  OT Interventions Balance/vestibular training, Cognitive remediation/compensation, DME/adaptive equipment instruction, Discharge planning, Self Care/advanced ADL retraining, Pain management, Therapeutic Activities, UE/LE Coordination activities, Therapeutic Exercise, Patient/family education, Functional mobility training, Neuromuscular re-education, UE/LE Strength taining/ROM  SLP Interventions Cognitive remediation/compensation, Cueing hierarchy, Environmental controls, Functional tasks, Dysphagia/aspiration precaution training, Patient/family education  TR Interventions    SW/CM Interventions Discharge Planning, Psychosocial Support, Patient/Family Education   Barriers to Discharge MD  Medical stability  Nursing      PT      OT      SLP Decreased caregiver support    SW       Team Discharge Planning: Destination: PT-  ,OT- Home , SLP-Home Projected Follow-up: PT-Outpatient PT, OT-  Home health OT, 24 hour supervision/assistance, SLP-Home Health SLP, Outpatient SLP, 24 hour supervision/assistance Projected Equipment Needs: PT-To be determined, OT- None recommended by OT, SLP-None recommended by SLP Equipment Details: PT- , OT-  Patient/family involved in discharge planning: PT- Patient, Family member/caregiver,  OT-Patient, Family  member/caregiver, SLP-Patient, Family member/caregiver  MD ELOS: 14-19 days. Medical Rehab Prognosis:  Good Assessment: 83  y.o. right handed male with history significant for CKD stage IV, dementia, parkinsonian maintained on Sinemet, AAA with prior endovascular repair and angiogram 2009, renal cell carcinoma solitary left kidney, hypertension, COPD, chronic anemia with pancytopenia. Presented 11/23/2017 after seeing his nephrologist for possible AAA endoleak repair by interventional radiology. 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 Postprocedure mildly hypoxic. Chest x-ray showed right lower lobe consolidation versus atelectasis. Patient remained on oxygen therapy and follow-up her pulmonary services. Patient recovered nicely and pulmonary services signed off 11/28/2017. Renal services follow-up for CKD stage III with elevated creatinine. Renal ultrasound unremarkable left kidney. Slow recovery expected. On 11/28/2017 cardiology services consulted for new onset atrial fibrillation with slow ventricular response felt to be exacerbated by his current medical illness. Patient had no chest pain or shortness of breath. Echocardiogram completed 11/28/2017 showing ejection fraction of 60% no wall motion abnormalities. Patient not felt to be a Coumadin candidate due to fall risk as well with noted history of pancytopenia. Patient with resultant functional deficits with mobility, self-care, cognition. Will set goals for supervision/Min A for most tasks with PT/OT and supervision with SLP.  See Team Conference Notes for weekly updates to the plan of care

## 2017-11-30 NOTE — Progress Notes (Signed)
Checked BP manually, see flowsheet, held hydralazine for 1400.  Patient has been drowsy today and family is extremely concerned about fluid balance.  Linna Hoff, PA aware of concerns.  Brita Romp, RN

## 2017-12-01 ENCOUNTER — Inpatient Hospital Stay (HOSPITAL_COMMUNITY): Payer: Medicare Other | Admitting: Occupational Therapy

## 2017-12-01 ENCOUNTER — Other Ambulatory Visit: Payer: Self-pay

## 2017-12-01 ENCOUNTER — Inpatient Hospital Stay (HOSPITAL_COMMUNITY): Payer: Medicare Other

## 2017-12-01 ENCOUNTER — Inpatient Hospital Stay (HOSPITAL_COMMUNITY): Payer: Medicare Other | Admitting: Physical Therapy

## 2017-12-01 ENCOUNTER — Inpatient Hospital Stay (HOSPITAL_COMMUNITY): Payer: Medicare Other | Admitting: Speech Pathology

## 2017-12-01 DIAGNOSIS — R1310 Dysphagia, unspecified: Secondary | ICD-10-CM

## 2017-12-01 DIAGNOSIS — J181 Lobar pneumonia, unspecified organism: Secondary | ICD-10-CM

## 2017-12-01 LAB — URINE CULTURE: Culture: NO GROWTH

## 2017-12-01 MED ORDER — HYDRALAZINE HCL 25 MG PO TABS
25.0000 mg | ORAL_TABLET | Freq: Three times a day (TID) | ORAL | Status: DC
  Administered 2017-12-01 – 2017-12-07 (×19): 25 mg via ORAL
  Filled 2017-12-01 (×19): qty 1

## 2017-12-01 NOTE — Progress Notes (Signed)
Social Work Patient ID: [name], male   DOB: 1927/09/26, 83 y.o.   MRN: [id number]  Met with pt and daughter to discuss team conference goals supervision-min level and target discharge 12/18. She feels he is much better today and is hopeful he will not need to stay here that long. She does want to keep the 12/13 appointment and would like a pass for the afternoon for this. Will ask MD would end up being 2:00-5:00 at the latest. Pt does look better today and rested well last night. Will continue to work on discharge plans and provide support to both.

## 2017-12-01 NOTE — Progress Notes (Signed)
Physical Therapy Session Note  Patient Details  Name: [name] MRN: [id number] Date of Birth: Aug 19, 1927  Today's Date: 12/01/2017 PT Individual Time: 1252-7129 PT Individual Time Calculation (min): 50 min   Short Term Goals: Week 1:  PT Short Term Goal 1 (Week 1): Pt will perform all bed mobility with min assist PT Short Term Goal 2 (Week 1): Pt will perform sit<>stand with min assist PT Short Term Goal 3 (Week 1): Pt will ambulate x 50 ft with min assist PT Short Term Goal 4 (Week 1): Pt will ascend/descend 3 inch step with mod assist  Skilled Therapeutic Interventions/Progress Updates:   Pt received w/ OT and agreeable to therapy, no c/o pain. Focused on endurance and tolerance to OOB activity this session. Pt self-propelled w/c 50' w/ BLEs and verbal cues for technique, supervision assist overall. Ambulated 20' and 30' w/ Min assist using RW, verbal and visual cues for gait pattern, ambulates w/ shuffled gait pattern. Performed NuStep 10 min @ L1 for reciprocal movement pattern and LE strengthening, occasional verbal and manual cues for technique and attention to task. Returned to room total assist in w/c for time management, transferred to EOB and supine w/ RW via stand pivot transfer w/ mod assist overall. Ended session in supine and in care of son-in-law, all needs met.   Therapy Documentation Precautions:  Precautions Precautions: Fall Restrictions Weight Bearing Restrictions: No Vital Signs: Therapy Vitals Temp: 98.2 F (36.8 C) Temp Source: Oral Pulse Rate: 69 Resp: 17 BP: 120/73 Patient Position (if appropriate): Sitting Oxygen Therapy SpO2: 94 % O2 Device: Not Delivered  See Function Navigator for Current Functional Status.   Therapy/Group: Individual Therapy  Jerris Keltz K Arnette 12/01/2017, 3:58 PM

## 2017-12-01 NOTE — Progress Notes (Signed)
Speech Language Pathology Daily Session Note  Patient Details  Name: [name] MRN: [id number] Date of Birth: [birth year]  Today's Date: 12/01/2017 SLP Individual Time: 1305-1400 SLP Individual Time Calculation (min): 55 min  Short Term Goals: Week 1: SLP Short Term Goal 1 (Week 1): Pt will demonstrate sustained attention to functional tasks for 5 mintues with Max verbal cues. SLP Short Term Goal 2 (Week 1): Pt will demonstrate follow 1 step commands in 80% of opportunies with Max A verbal cues. SLP Short Term Goal 3 (Week 1): Pt will complete functional problem solving for basic and familar tasks with Max A multimodal cues. SLP Short Term Goal 4 (Week 1): Pt will utilize external memory aids to recall new, daily information with Max A verbal and question cues. SLP Short Term Goal 5 (Week 1): Pt will identify 2 congitive and 2 phsyical deficits with Mod A multimodal cues.  SLP Short Term Goal 6 (Week 1): Pt will consume trials of dys 3 textured foods with effective mastication and oral clearance with min overt s/s aspriation and Min A verbal cues to follow swallow strategies.  Skilled Therapeutic Interventions:  Pt was seen for skilled ST targeting goals for dysphagia and cognition.  Pt was received in bed awake and alert, pleasantly confused.  Pt needed max assist multimodal cues for initiation and sequencing of transfer from bed to wheelchair.  Therapist facilitated the session with a functional snack of regular textures and thin liquids to continue working towards diet progression.  Pt demonstrated grossly intact mastication of advanced solids without overt s/s of aspiration with solids or liquids.  Would recommend slow progression to dys 3 textures given that pt's mentation is improved but he still seems to have slowed initiation and decreased task initiation which could impact swallowing safety.  Pt was able to complete basic pipe tree tasks with mod-max assist for functional problem  solving and was able to sustain his attention to task for ~3-5 minute intervals with mod verbal cues for redirection to task.  Pt was returned to room and left in wheelchair with son in law at bedside.  Continue per current plan of care.    Function:  Eating Eating   Modified Consistency Diet: No Eating Assist Level: Supervision or verbal cues           Cognition Comprehension Comprehension assist level: Understands basic 50 - 74% of the time/ requires cueing 25 - 49% of the time  Expression   Expression assist level: Expresses basic 50 - 74% of the time/requires cueing 25 - 49% of the time. Needs to repeat parts of sentences.  Social Interaction Social Interaction assist level: Interacts appropriately 50 - 74% of the time - May be physically or verbally inappropriate.  Problem Solving Problem solving assist level: Solves basic 25 - 49% of the time - needs direction more than half the time to initiate, plan or complete simple activities  Memory Memory assist level: Recognizes or recalls less than 25% of the time/requires cueing greater than 75% of the time    Pain Pain Assessment Pain Assessment: No/denies pain  Therapy/Group: Individual Therapy  Kaeli Nichelson, Selinda Orion 12/01/2017, 8:29 PM

## 2017-12-01 NOTE — Progress Notes (Addendum)
[facility] PHYSICAL MEDICINE & REHABILITATION     PROGRESS NOTE  Subjective/Complaints:  Patient seen sitting up in his chair this morning. Evaluated patient with nurse manager. Nurse and OT also present. Patient's daughter answers all questions and cues patient. She states that patient did much better overnight and is doing better this morning. Later informed by nursing that patient's daughter stated that patient gets anxious when multiple people are in the room and has difficulty answering questions. She also states that patient gets frustrated when he he is asked orientation questions. She also stated that I speak to loud assist the patient, despite his hearing loss. Overnight patient was noted to have some shortness of breath. Chest x-ray ordered showing possible pneumonia.  ROS: Appears to deny CP, SOB, nausea, vomiting, diarrhea..  Objective: Vital Signs: Blood pressure (!) 152/66, pulse 79, temperature 98.3 F (36.8 C), temperature source Oral, resp. rate 16, weight 69 kg (152 lb 1.9 oz), SpO2 94 %. Dg Chest 2 View  Result Date: 11/30/2017 CLINICAL DATA:  Cough EXAM: CHEST  2 VIEW COMPARISON:  November 27, 2017 and November 26, 2017 FINDINGS: There is airspace consolidation in the left lower lobe with small left pleural effusion. There is mild atelectasis in the right base. Right lung is otherwise clear. Heart is upper normal in size with pulmonary vascularity normal. No adenopathy. There is aortic atherosclerosis. No evident bone lesions. IMPRESSION: Airspace consolidation left lower lobe posteriorly with small left pleural effusion. Suspect pneumonia left base. Mild right base atelectasis. Heart upper normal in size.  There is aortic atherosclerosis. Aortic Atherosclerosis (ICD10-I70.0). Electronically Signed   By: Lowella Grip III M.D.   On: 11/30/2017 18:09   Recent Labs    11/29/17 0429 11/30/17 0650  WBC 3.3* 3.5*  HGB 9.2* 9.4*  HCT 28.1* 29.7*  PLT 96* 103*   Recent Labs     11/29/17 0429 11/30/17 0650  NA 138 139  K 4.1 4.3  CL 99* 104  GLUCOSE 109* 110*  BUN 59* 55*  CREATININE 3.02* 2.65*  CALCIUM 8.5* 8.6*   CBG (last 3)  No results for input(s): GLUCAP in the last 72 hours.  Wt Readings from Last 3 Encounters:  11/30/17 69 kg (152 lb 1.9 oz)  11/28/17 70.8 kg (156 lb 1.4 oz)  11/02/17 72.7 kg (160 lb 4 oz)    Physical Exam:  BP (!) 152/66 (BP Location: Left Arm)   Pulse 79   Temp 98.3 F (36.8 C) (Oral)   Resp 16   Wt 69 kg (152 lb 1.9 oz)   SpO2 94%   BMI 23.13 kg/m  Constitutional: He appears well-developed. Frail  HENT: Normocephalic and atraumatic.  Eyes: EOM are normal. No discharge.  Cardiovascular: Irregularly irregular. No JVD Respiratory: Limited inspiratory effort.  +Rales  GI: Bowel sounds are normal. He exhibits no distension/nontender  Musculoskeletal: He exhibits edema (LE). He exhibits no tenderness.  Neurological: Not oriented to date. Motor: B/l UE 4-/5 proximal to distal  B/l LE: 4-/5 proximal to distal HOH Dysarthria. Skin. Warm and dry. Intact.  Psych: Unable to thoroughly assess  Assessment/Plan: 1. Functional deficits secondary to debility which require 3+ hours per day of interdisciplinary therapy in a comprehensive inpatient rehab setting. Physiatrist is providing close team supervision and 24 hour management of active medical problems listed below. Physiatrist and rehab team continue to assess barriers to discharge/monitor patient progress toward functional and medical goals.  Function:  Bathing Bathing position   Position: Wheelchair/chair at sink  Bathing parts Body parts bathed by patient: Right arm, Left arm, Chest, Abdomen, Right upper leg, Left upper leg Body parts bathed by helper: Buttocks, Front perineal area, Back, Left lower leg, Right lower leg  Bathing assist        Upper Body Dressing/Undressing Upper body dressing   What is the patient wearing?: Pull over shirt/dress        Pull over shirt/dress - Perfomed by helper: Thread/unthread right sleeve, Thread/unthread left sleeve, Put head through opening, Pull shirt over trunk        Upper body assist        Lower Body Dressing/Undressing Lower body dressing   What is the patient wearing?: Pants       Pants- Performed by helper: Thread/unthread right pants leg, Thread/unthread left pants leg, Pull pants up/down                      Lower body assist        Toileting Toileting          Toileting assist     Transfers Chair/bed transfer     Chair/bed transfer assist level: Moderate assist (Pt 50 - 74%/lift or lower) Chair/bed transfer assistive device: Walker, Air cabin crew     Max distance: 20 ft Assist level: Moderate assist (Pt 50 - 74%)   Wheelchair   Type: Manual Max wheelchair distance: 50 ft Assist Level: Supervision or verbal cues  Cognition Comprehension Comprehension assist level: Understands basic 25 - 49% of the time/ requires cueing 50 - 75% of the time  Expression Expression assist level: Expresses basic 50 - 74% of the time/requires cueing 25 - 49% of the time. Needs to repeat parts of sentences.  Social Interaction Social Interaction assist level: Interacts appropriately 50 - 74% of the time - May be physically or verbally inappropriate.  Problem Solving Problem solving assist level: Solves basic 25 - 49% of the time - needs direction more than half the time to initiate, plan or complete simple activities  Memory Memory assist level: Recognizes or recalls less than 25% of the time/requires cueing greater than 75% of the time    Medical Problem List and Plan:  1. Debility secondary to acute respiratory failure with hypoxia    Continue CIR 2. DVT Prophylaxis/Anticoagulation: SCDs. Monitor for any signs of DVT  3. Pain Management: Oxycodone. Monitor mental status  4. Mood/dementia: Aricept 5 mg daily  5. Neuropsych: This patient is capable of  making decisions on his own behalf.  6. Skin/Wound Care: Routine skin checks  7. Fluids/Electrolytes/Nutrition: Routine I&O's    Patient downgraded to dysphagia 2 thin liquid diet. Daughter states that this was only a result of patient's mouth being dry and requests reevaluation. 8. New onset atrial fibrillation. Follow-up per cardiology services. Currently not a candidate for Coumadin therapy. Patient is asymptomatic  9. Acute on chronic kidney disease stage IV as well as hyperkalemia. Follow-up per renal services. Patient was using Kayexalate 15 g 2 times a week Mondays and Fridays prior to admission. Follow electrolytes closely. He is followed by [facility] nephrology [place], [place] [address]    Creatinine 2.65 on 12/5   Potassium 4.3 on 12/5   Labs ordered for tomorrow    Nephrology notes reviewed, appreciate recs, signed off. Will request nephrology following    Continue to monitor 10. History of AAA with concern for endoleak. Follow-up interventional radiology no leak identified on workup  11. History of renal cell carcinoma with solitary kidney. Monitor urine output  12. Anemia of chronic disease. Continue Aranesp    Hemoglobin 9.4 on 12/5   Labs ordered for tomorrow   Continue to monitor 13. Pancytopenia/thrombocytopenia. Followed hematology in [facility]    Persistent, but stable on 12/5   Labs ordered for tomorrow 14. History of Parkinson's disease. Sinemet adjusted due to patient not being able to tolerate pills.  15. HTN   Hydralazine increased on 12/6 16. Hypoalbuminemia    Supplement initiated on 12/5 17. PNA   Left lower lobe, chest x-ray reviewed on 12/5   Levaquin started on 12/5  LOS (Days) 2 A FACE TO FACE EVALUATION WAS PERFORMED  Ankit Lorie Phenix 12/01/2017 9:00 AM

## 2017-12-01 NOTE — Plan of Care (Signed)
Skin has some ecchymosis Denies any pain at present In bed with bed alarm and 3 SR

## 2017-12-01 NOTE — Progress Notes (Signed)
Occupational Therapy Session Note  Patient Details  Name: [name] MRN: [id number] Date of Birth: 31-Aug-1927  Today's Date: 12/01/2017 OT Individual Time: 0800-0900 and 1400-1432 OT Individual Time Calculation (min): 60 min and 32 min    Short Term Goals: Week 1:  OT Short Term Goal 1 (Week 1): Pt will perform LB bathing sit to stand with min assist for 2 consecutive sessions.  OT Short Term Goal 2 (Week 1): Pt will perform UB dressing with supervision to donn pullover shirt.  OT Short Term Goal 3 (Week 1): Pt will perform LB dressing sit to stand with mod assist.  OT Short Term Goal 4 (Week 1): Pt will complete toilet transfer with min assist stand pivot with use of the RW for support.   Skilled Therapeutic Interventions/Progress Updates:    Session 1: Pt received sitting up in recliner, agreeable to OT tx session. [name] present during session. Pt completed stand pivot recliner to w/c using RW. Pt requires Min-ModA for sit<>stand throughout session, MinA for stand pivot with increased time and verbal cues for sequencing transfer using RW, multimodal cues for hand placement during sit<>stand. Pt completed LB bathing and dressing seated in w/c at sink. Pt washes LEs in sitting, perineal/buttocks in standing with ModA for balance in standing. Pt requires MaxA to don underwear/pants, total assist for footwear. Pt requires increased time and max verbal/multimodal cues for sequencing tasks and understanding instructions. Pt left seated in recliner end of session, call bell and needs within reach, daughter present.  Session 2: Pt received in bathroom, completing transfer to toilet with NT and son-in-law present. Pt completes sit<>stand during session with Min-ModA, total assist for peri-care after BM and clothing management. Pt completes stand pivot transfer to w/c using RW with MinA, increased time and cues for sequencing transfer. Pt stands at sink to wash hands with Mod steadying assist  during bil UE task. Pt completes UB bathing/dressing seated in w/c at sink. Pt requires max multimodal cues to initiate taking shirt off (Pt beginning to doff pants when initially instructed to doff shirt). Pt washes upper body with assist for washing back. Dons overhead shirt with MaxA for threading bil UEs and pulling shirt over trunk. Handoff to PT end of session with Pt seated in w/c.   Therapy Documentation Precautions:  Precautions Precautions: Fall Restrictions Weight Bearing Restrictions: No   Pain: Pain Assessment Pain Assessment: No/denies pain Pain Score: 0-No pain  See Function Navigator for Current Functional Status.   Therapy/Group: Individual Therapy  [name] 12/01/2017, 10:21 AM

## 2017-12-02 ENCOUNTER — Inpatient Hospital Stay (HOSPITAL_COMMUNITY): Payer: Medicare Other | Admitting: Occupational Therapy

## 2017-12-02 ENCOUNTER — Inpatient Hospital Stay (HOSPITAL_COMMUNITY): Payer: Medicare Other | Admitting: Physical Therapy

## 2017-12-02 ENCOUNTER — Inpatient Hospital Stay (HOSPITAL_COMMUNITY): Payer: Medicare Other | Admitting: Speech Pathology

## 2017-12-02 DIAGNOSIS — R05 Cough: Secondary | ICD-10-CM

## 2017-12-02 LAB — BASIC METABOLIC PANEL WITH GFR
Anion gap: 8 (ref 5–15)
BUN: 55 mg/dL — ABNORMAL HIGH (ref 6–20)
CO2: 26 mmol/L (ref 22–32)
Calcium: 8.9 mg/dL (ref 8.9–10.3)
Chloride: 106 mmol/L (ref 101–111)
Creatinine, Ser: 2.6 mg/dL — ABNORMAL HIGH (ref 0.61–1.24)
GFR calc Af Amer: 24 mL/min — ABNORMAL LOW
GFR calc non Af Amer: 20 mL/min — ABNORMAL LOW
Glucose, Bld: 105 mg/dL — ABNORMAL HIGH (ref 65–99)
Potassium: 4.4 mmol/L (ref 3.5–5.1)
Sodium: 140 mmol/L (ref 135–145)

## 2017-12-02 MED ORDER — CARBIDOPA-LEVODOPA ER 25-100 MG PO TBCR
0.5000 | EXTENDED_RELEASE_TABLET | Freq: Two times a day (BID) | ORAL | Status: DC
  Administered 2017-12-02 – 2017-12-07 (×11): 0.5 via ORAL
  Filled 2017-12-02 (×11): qty 0.5

## 2017-12-02 MED ORDER — HYDROCERIN EX CREA
TOPICAL_CREAM | Freq: Two times a day (BID) | CUTANEOUS | Status: DC | PRN
  Filled 2017-12-02: qty 113

## 2017-12-02 NOTE — Progress Notes (Signed)
Occupational Therapy Session Note  Patient Details  Name: [name] MRN: [id number] Date of Birth: Feb 01, 1927  Today's Date: 12/02/2017 OT Individual Time: 0900-1000 OT Individual Time Calculation (min): 60 min    Short Term Goals: Week 1:  OT Short Term Goal 1 (Week 1): Pt will perform LB bathing sit to stand with min assist for 2 consecutive sessions.  OT Short Term Goal 2 (Week 1): Pt will perform UB dressing with supervision to donn pullover shirt.  OT Short Term Goal 3 (Week 1): Pt will perform LB dressing sit to stand with mod assist.  OT Short Term Goal 4 (Week 1): Pt will complete toilet transfer with min assist stand pivot with use of the RW for support.   Skilled Therapeutic Interventions/Progress Updates:    Pt completed wheelchair mobility to the nurses station with min assist for steering to begin session.  Therapist then assisted the rest of the way to the therapy gym.  Had pt transfer to the therapy mat with min assist using the RW.  Focused session on standing balance and posture.  Had pt stand without UE support and min to mod assist from therapist while reaching for pegs up high and down low, and then placing in peg board.  Pt needs mod facilitation for hip, knee, and lumbar extension in standing.  While resting in sitting, therapist provided manual facilitation for scapular adduction with cervical extension.  Pt needed multiple rest breaks throughout intervals.  Had him work in standing as well stepping forward with each LE onto the 2-3" step with mod assist for balance.  Finished session with functional mobility to the nurses station with mod instructional cueing for larger steps and min assist.  Finished return to room via wheelchair with pt left in room with daughter present.  Daughter attended all of session and provided support and encouragement.  Call button and phone in reach.     Therapy Documentation Precautions:  Precautions Precautions:  Fall Restrictions Weight Bearing Restrictions: No  Pain: Pain Assessment Pain Assessment: No/denies pain ADL: See Function Navigator for Current Functional Status.   Therapy/Group: Individual Therapy  [name] OTR/L 12/02/2017, 12:29 PM

## 2017-12-02 NOTE — Progress Notes (Signed)
Social Work Patient ID: [name], male   DOB: 08/12/1927, 83 y.o.   MRN: [id number]  Met with pt and daughter both report he is doing much better and is feeling better. He is not requiring as much assistance with PT, his balance is better also. Daughter is hopeful he will be ready sooner than the expected discharge date of 12/18.

## 2017-12-02 NOTE — Progress Notes (Signed)
Social Work   [name], [name]  Social Worker  Physical Medicine and Rehabilitation  Patient Care Conference  Signed  Date of Service:  11/30/2017  3:58 PM          Signed          [] Hide copied text  [] Hover for details   Inpatient RehabilitationTeam Conference and Plan of Care Update Date: 11/30/2017   Time: 2:20 PM      Patient Name: [name]      Medical Record Number: [id number]  Date of Birth: 11-18-1927 Sex: Male         Room/Bed: 4M07C/4M07C-01 Payor Info: Payor: MEDICARE / Plan: MEDICARE PART A AND B / Product Type: *No Product type* /     Admitting Diagnosis: Debility  Admit Date/Time:  11/29/2017  3:49 PM Admission Comments: No comment available    Primary Diagnosis:  Debility Principal Problem: Debility       Patient Active Problem List    Diagnosis Date Noted  . Dysphagia    . Pneumonia of left lower lobe due to infectious organism ([place])    . Hypoalbuminemia due to protein-calorie malnutrition ([place])    . Benign essential HTN    . Thrombocytopenia ([place])    . Anemia of chronic disease    . Chronic kidney disease (CKD), stage IV (severe) ([place])    . Debility 11/29/2017  . Leaking abdominal aortic aneurysm (AAA) ([place]) 11/24/2017  . Pressure injury of skin 11/23/2017  . Parkinsonism ([place]) 07/10/2017  . Status post dilatation of esophageal stricture 05/15/2017  . Pancytopenia ([place]) 03/31/2016  . Follow-up --- PCP NOTES 09/02/2015  . Neuropathy 06/10/2015  . Pedal edema 01/30/2015  . Hip pain, chronic 01/30/2015  . Back pain 01/30/2015  . Cramps of lower extremity 01/30/2015  . Aftercare following surgery of the circulatory system, [place] 08/05/2014  . Annual physical exam 10/19/2011  . DJD (degenerative joint disease) 12/14/2010  . Anemia 06/06/2009  . COPD (chronic obstructive pulmonary disease) ([place]) 06/06/2009  . RENAL CELL CANCER 12/25/2008  . HTN (hypertension) 12/25/2008  . AAA (abdominal aortic aneurysm) without rupture ([place])  12/25/2008  . CKD (chronic kidney disease) 12/25/2008  . HYPERCHOLESTEROLEMIA 03/01/2008  . GERD 03/01/2008  . ACNE ROSACEA 11/29/2007  . DIVERTICULOSIS, COLON 04/12/2007      Expected Discharge Date: Expected Discharge Date: 12/13/17   Team Members Present: Physician leading conference: [name] Social Worker Present: [name] Kin, LCSW Nurse Present: [name] Sneddon, RN PT Present: [name], PT OT Present: [name], OT SLP Present: [name], SLP PPS Coordinator present : [name] Nakayama, RN, CRRN       Current Status/Progress Goal Weekly Team Focus  Medical     Debility secondary to acute respiratory failure with hypoxia  Improve mobility, endurance, AKI, HTN  See above   Bowel/Bladder     Continent of bowel and bladder during day.  Has had some incontinent episodes.  LBM 12/6  Min assist  Assess and treat for constipation as needed   Swallow/Nutrition/ Hydration     Dys 2, thin liquids; full supervision   supervision   trials of advanced textures    ADL's     max asssist for all bathing with mod to max assist for sit to stand transitions.  Mod assist for pivot transfers to the toilet.  Decreased initiation and cogntivie sequencing.  supervision to min assist overall  selfcare retraining, balance retraining, transfer training, therapeutic exercise. pt/family education, therapeutic activities.  Mobility     min assist bed mobility, mod-max for sit<>stand, standing balance and gait up to 20 ft with RW  supervision-min assist goals  standing balance, transfers, postural control, endurance, gait   Communication               Safety/Cognition/ Behavioral Observations   min assist   supervision   basic cognition    Pain     Denies pain  < 3  Assess and treat for pain q shift and prn   Skin     Scattered bruising throughout the body.  MASD to scrotum- MCG powder; tip of penis ecchymotic after removal of condom catheter on uncircumcised penis.  Patient  will have no further skin breakdown or infection with mod assist.  Assess skin q shift and prn     *See Care Plan and progress notes for long and short-term goals.      Barriers to Discharge   Current Status/Progress Possible Resolutions Date Resolved   Physician     Medical stability;New oxygen     See above  Therapies, follow labs, optimize BP meds      Nursing                 PT                    OT                 SLP Decreased caregiver support            SW              Discharge Planning/Teaching Needs:    Home with daughter arranging 24 hr care, pt was home alone prior to admission and this is new for them     Team Discussion:  Goals supervision-min assist level. Currently mod/max level and has a posterior lean. Medical issues watching AKI, Pneumonia and HTN. New evaluation today. Poor iniaition and carryover hopefully will get better when adjusts to the unit  Revisions to Treatment Plan:  DC 12/18    Continued Need for Acute Rehabilitation Level of Care: The patient requires daily medical management by a physician with specialized training in physical medicine and rehabilitation for the following conditions: Daily direction of a multidisciplinary physical rehabilitation program to ensure safe treatment while eliciting the highest outcome that is of practical value to the patient.: Yes Daily medical management of patient stability for increased activity during participation in an intensive rehabilitation regime.: Yes Daily analysis of laboratory values and/or radiology reports with any subsequent need for medication adjustment of medical intervention for : Renal problems;Pulmonary problems;Blood pressure problems   [name] 12/02/2017, 8:47 AM                 Patient ID: [name], male   DOB: December 27, 1927, 83 y.o.   MRN: [id number]

## 2017-12-02 NOTE — Progress Notes (Signed)
Speech Language Pathology Daily Session Note  Patient Details  Name: [name] MRN: [id number] Date of Birth: 31-Aug-1927  Today's Date: 12/02/2017 SLP Individual Time: 0805-0900 SLP Individual Time Calculation (min): 55 min  Short Term Goals: Week 1: SLP Short Term Goal 1 (Week 1): Pt will demonstrate sustained attention to functional tasks for 5 mintues with Max verbal cues. SLP Short Term Goal 2 (Week 1): Pt will demonstrate follow 1 step commands in 80% of opportunies with Max A verbal cues. SLP Short Term Goal 3 (Week 1): Pt will complete functional problem solving for basic and familar tasks with Max A multimodal cues. SLP Short Term Goal 4 (Week 1): Pt will utilize external memory aids to recall new, daily information with Max A verbal and question cues. SLP Short Term Goal 5 (Week 1): Pt will identify 2 congitive and 2 phsyical deficits with Mod A multimodal cues.  SLP Short Term Goal 6 (Week 1): Pt will consume trials of dys 3 textured foods with effective mastication and oral clearance with min overt s/s aspriation and Min A verbal cues to follow swallow strategies.  Skilled Therapeutic Interventions:  Pt was seen for skilled ST targeting cognitive goals.  Pt was sitting up in bed upon therapist's arrival with congested, nonproductive cough which appeared worse in comparison to yesterday's therapy session.  Suspect pt may typically have increased congestion in the AM due to decreased mobility overnight.  Otherwise pt had no complaints of pain or discomfort and was agreeable to participating in therapies.  Pt initiated a request to use the bathroom independently with increased time in the context of getting out of bed to prepare for therapies.  No success on bedside commode, only gas.  Therapist facilitated the session with a simple peg board task to address task initiation and sustained attention.  Pt needed max assist for problem solving to complete task due to perseverative errors;  however, he sustained his attention to task in a quiet environment for ~5 minute intervals with min-mod verbal cues for redirection.  Mod-max assist needed for very basic route finding back to his room.  Pt left in wheelchair with daughter at bedside.  Continue per current plan of care.    Function:  Eating Eating                 Cognition Comprehension Comprehension assist level: Understands basic 75 - 89% of the time/ requires cueing 10 - 24% of the time  Expression   Expression assist level: Expresses basic 50 - 74% of the time/requires cueing 25 - 49% of the time. Needs to repeat parts of sentences.  Social Interaction Social Interaction assist level: Interacts appropriately 50 - 74% of the time - May be physically or verbally inappropriate.  Problem Solving Problem solving assist level: Solves basic 25 - 49% of the time - needs direction more than half the time to initiate, plan or complete simple activities  Memory Memory assist level: Recognizes or recalls 25 - 49% of the time/requires cueing 50 - 75% of the time    Pain Pain Assessment Pain Assessment: No/denies pain  Therapy/Group: Individual Therapy  Alyssa Rotondo, Selinda Orion 12/02/2017, 11:19 AM

## 2017-12-02 NOTE — Plan of Care (Signed)
Pt respiratory status is stable Pt denies any pain Continent of Bladder

## 2017-12-02 NOTE — Progress Notes (Signed)
Occupational Therapy Session Note  Patient Details  Name: [name] MRN: [id number] Date of Birth: Feb 28, 1927  Today's Date: 12/02/2017 OT Individual Time: 1451-1535 OT Individual Time Calculation (min): 44 min    Short Term Goals: Week 1:  OT Short Term Goal 1 (Week 1): Pt will perform LB bathing sit to stand with min assist for 2 consecutive sessions.  OT Short Term Goal 2 (Week 1): Pt will perform UB dressing with supervision to donn pullover shirt.  OT Short Term Goal 3 (Week 1): Pt will perform LB dressing sit to stand with mod assist.  OT Short Term Goal 4 (Week 1): Pt will complete toilet transfer with min assist stand pivot with use of the RW for support.   Skilled Therapeutic Interventions/Progress Updates:    Pt completed bed mobility and rolling to start session.  Mod facilitation with mod assist needed to facilitate and complete rolling side to side as well as for transition to sitting from sidelying.  Worked on functional mobility to the [employer] with min assist using the RW.  Mod instructional cueing with min facilitation for maintaining upright trunk and head during mobility as well as for taking larger steps.  Had pt work on standing balance and pre-gait, taking larger big steps to targets on the floor.  Pt needing mod assist to maintain balance, complete weightshifts, and take larger steps.  Finished session with ambulation back to the room without assistive device and overall min hand held assist.  Pt taking larger steps overall.  Once in the room pt transitioned to sitting in the wheelchair with daughter present.  Educated her on positioning to help expand pt's rib cage and allow for greater shoulder abduction and scapular adduction when in bed.  Therapy Documentation Precautions:  Precautions Precautions: Fall Restrictions Weight Bearing Restrictions: No  Pain: Pain Assessment Pain Assessment: No/denies pain ADL: See Function Navigator for Current Functional  Status.   Therapy/Group: Individual Therapy  [name] OTR/L 12/02/2017, 4:18 PM

## 2017-12-02 NOTE — Progress Notes (Signed)
Physical Therapy Session Note  Patient Details  Name: MARKEVIUS TROMBETTA MRN: [id number] Date of Birth: [birth year]  Today's Date: 12/02/2017 PT Individual Time: 1105-1200 PT Individual Time Calculation (min): 55 min   Short Term Goals: Week 1:  PT Short Term Goal 1 (Week 1): Pt will perform all bed mobility with min assist PT Short Term Goal 2 (Week 1): Pt will perform sit<>stand with min assist PT Short Term Goal 3 (Week 1): Pt will ambulate x 50 ft with min assist PT Short Term Goal 4 (Week 1): Pt will ascend/descend 3 inch step with mod assist  Skilled Therapeutic Interventions/Progress Updates:   Pt in w/c upon arrival and agreeable to therapy, no c/o pain. Worked on functional activity and tolerance to standing this session. Total assist w/c transport to/from gym for time management and performed sit<>stand x4 w/ min assist for balance and cues for technique. Tolerated 30-60 sec of standing at a time while performing unilateral UE task w/ emphasis on facilitating thoracic extension. Pt requesting to toilet and taken back to room. Performed stand pivot transfer to/from toilet w/ mod assist and total assist for pericare. Ambulated from bathroom to EOB w/ min guard 20' and transferred to supine, ended session in supine and in care of daughter, all needs met.   Therapy Documentation Precautions:  Precautions Precautions: Fall Restrictions Weight Bearing Restrictions: No Pain: Pain Assessment Pain Assessment: No/denies pain  See Function Navigator for Current Functional Status.   Therapy/Group: Individual Therapy  Oddie Kuhlmann K Arnette 12/02/2017, 12:19 PM

## 2017-12-02 NOTE — Plan of Care (Signed)
  Not Progressing RH BLADDER ELIMINATION RH STG MANAGE BLADDER WITH ASSISTANCE Description STG Manage Bladder With Assistance. Mod I  12/02/2017 1746 - Not Progressing by Marney Setting, RN Note Still requiring max assist from family to hold and manage urinal.

## 2017-12-02 NOTE — Progress Notes (Signed)
[place] PHYSICAL MEDICINE & REHABILITATION     PROGRESS NOTE  Subjective/Complaints:  Patient seen sitting up in bed this morning. Daughter at bedside who continues to all questions. She states patient slept well overnight and is doing well this morning.  ROS: Appears to deny CP, SOB, nausea, vomiting, diarrhea..  Objective: Vital Signs: Blood pressure (!) 156/64, pulse 74, temperature 98.1 F (36.7 C), temperature source Oral, resp. rate 16, weight 69 kg (152 lb 1.9 oz), SpO2 96 %. Dg Chest 2 View  Result Date: 12/01/2017 CLINICAL DATA:  Off and shortness of breath for several days. History of COPD, chronic renal insufficiency, peripheral vascular disease, former smoker. EXAM: CHEST  2 VIEW COMPARISON:  PA and lateral chest x-ray of November 30, 2017 FINDINGS: The lungs are adequately inflated. There is persistent increased density in the left lower lobe. The interstitial markings elsewhere are coarse. The cardiac silhouette is enlarged. The central pulmonary vascularity is prominent. There is calcification in the wall of the tortuous thoracic aorta which is unchanged. An abdominal aortic stent graft is visible. The bony thorax exhibits no acute abnormality. IMPRESSION: Persistent left lower lobe atelectasis or pneumonia. Chronic bronchitic changes, stable. Cardiomegaly with central pulmonary vascular congestion, stable. Thoracic aortic atherosclerosis. Electronically Signed   By: Tremell  Martinique M.D.   On: 12/01/2017 10:06   Dg Chest 2 View  Result Date: 11/30/2017 CLINICAL DATA:  Cough EXAM: CHEST  2 VIEW COMPARISON:  November 27, 2017 and November 26, 2017 FINDINGS: There is airspace consolidation in the left lower lobe with small left pleural effusion. There is mild atelectasis in the right base. Right lung is otherwise clear. Heart is upper normal in size with pulmonary vascularity normal. No adenopathy. There is aortic atherosclerosis. No evident bone lesions. IMPRESSION: Airspace  consolidation left lower lobe posteriorly with small left pleural effusion. Suspect pneumonia left base. Mild right base atelectasis. Heart upper normal in size.  There is aortic atherosclerosis. Aortic Atherosclerosis (ICD10-I70.0). Electronically Signed   By: Lowella Grip III M.D.   On: 11/30/2017 18:09   Recent Labs    11/30/17 0650  WBC 3.5*  HGB 9.4*  HCT 29.7*  PLT 103*   Recent Labs    11/30/17 0650 12/02/17 0546  NA 139 140  K 4.3 4.4  CL 104 106  GLUCOSE 110* 105*  BUN 55* 55*  CREATININE 2.65* 2.60*  CALCIUM 8.6* 8.9   CBG (last 3)  No results for input(s): GLUCAP in the last 72 hours.  Wt Readings from Last 3 Encounters:  11/30/17 69 kg (152 lb 1.9 oz)  11/28/17 70.8 kg (156 lb 1.4 oz)  11/02/17 72.7 kg (160 lb 4 oz)    Physical Exam:  BP (!) 156/64 (BP Location: Left Arm)   Pulse 74   Temp 98.1 F (36.7 C) (Oral)   Resp 16   Wt 69 kg (152 lb 1.9 oz)   SpO2 96%   BMI 23.13 kg/m  Constitutional: He appears well-developed. Frail  HENT: Normocephalic and atraumatic.  Eyes: EOM are normal. No discharge.  Cardiovascular: Irregularly irregular. No JVD Respiratory: Limited inspiratory effort.  Upper airway sounds.  GI: Bowel sounds are normal. He exhibits no distension/nontender  Musculoskeletal: He exhibits edema (LE). He exhibits no tenderness.  Neurological: Not oriented to date. Motor: B/l UE 4-/5 proximal to distal  B/l LE: Hip flexion 2-/5, knee extension 3/5, ADF/PF 4-/5 HOH Dysarthria. Skin. Warm and dry. Intact.  Psych: Unable to thoroughly assess due to cognition and daughters  continual interjection  Assessment/Plan: 1. Functional deficits secondary to debility which require 3+ hours per day of interdisciplinary therapy in a comprehensive inpatient rehab setting. Physiatrist is providing close team supervision and 24 hour management of active medical problems listed below. Physiatrist and rehab team continue to assess barriers to  discharge/monitor patient progress toward functional and medical goals.  Function:  Bathing Bathing position   Position: Wheelchair/chair at sink  Bathing parts Body parts bathed by patient: Right arm, Left arm, Chest, Abdomen, Right upper leg, Left upper leg, Right lower leg, Left lower leg, Front perineal area, Buttocks Body parts bathed by helper: Back  Bathing assist Assist Level: Touching or steadying assistance(Pt > 75%)      Upper Body Dressing/Undressing Upper body dressing   What is the patient wearing?: Pull over shirt/dress     Pull over shirt/dress - Perfomed by patient: Put head through opening Pull over shirt/dress - Perfomed by helper: Thread/unthread right sleeve, Thread/unthread left sleeve, Pull shirt over trunk        Upper body assist Assist Level: (MaxA)      Lower Body Dressing/Undressing Lower body dressing   What is the patient wearing?: Pants, Underwear, Non-skid slipper socks   Underwear - Performed by helper: Thread/unthread right underwear leg, Thread/unthread left underwear leg, Pull underwear up/down   Pants- Performed by helper: Thread/unthread right pants leg, Thread/unthread left pants leg, Pull pants up/down   Non-skid slipper socks- Performed by helper: Don/doff right sock, Don/doff left sock                  Lower body assist Assist for lower body dressing: (MaxA )      Toileting Toileting     Toileting steps completed by helper: Adjust clothing prior to toileting, Performs perineal hygiene, Adjust clothing after toileting Toileting Assistive Devices: Grab bar or rail  Toileting assist Assist level: (MaxA )   Transfers Chair/bed transfer   Chair/bed transfer method: Stand pivot Chair/bed transfer assist level: Moderate assist (Pt 50 - 74%/lift or lower) Chair/bed transfer assistive device: Walker, Air cabin crew     Max distance: 30' Assist level: Touching or steadying assistance (Pt > 75%)    Wheelchair   Type: Manual Max wheelchair distance: 50 ft Assist Level: Supervision or verbal cues  Cognition Comprehension Comprehension assist level: Understands basic 50 - 74% of the time/ requires cueing 25 - 49% of the time  Expression Expression assist level: Expresses basic 50 - 74% of the time/requires cueing 25 - 49% of the time. Needs to repeat parts of sentences.  Social Interaction Social Interaction assist level: Interacts appropriately 50 - 74% of the time - May be physically or verbally inappropriate.  Problem Solving Problem solving assist level: Solves basic 25 - 49% of the time - needs direction more than half the time to initiate, plan or complete simple activities  Memory Memory assist level: Recognizes or recalls less than 25% of the time/requires cueing greater than 75% of the time    Medical Problem List and Plan:  1. Debility secondary to acute respiratory failure with hypoxia    Continue CIR 2. DVT Prophylaxis/Anticoagulation: SCDs. Monitor for any signs of DVT  3. Pain Management: Oxycodone. Monitor mental status  4. Mood/dementia: Aricept 5 mg daily  5. Neuropsych: This patient is capable of making decisions on his own behalf.  6. Skin/Wound Care: Routine skin checks  7. Fluids/Electrolytes/Nutrition: Routine I&O's    Currently on D3 thins. 8. New  onset atrial fibrillation. Follow-up per cardiology services. Currently not a candidate for Coumadin therapy. Patient is asymptomatic  9. Acute on chronic kidney disease stage IV as well as hyperkalemia. Follow-up per renal services. Patient was using Kayexalate 15 g 2 times a week Mondays and Fridays prior to admission. Follow electrolytes closely. He is followed by [facility] nephrology [place], [place] Dr. Dimas Aguas    Creatinine 2.60 on 12/7   Potassium 4.4 on 12/7   Labs ordered for Monday    Nephrology notes reviewed, appreciate recs, signed off.     Continue to monitor 10. History of AAA with  concern for endoleak. Follow-up interventional radiology no leak identified on workup  11. History of renal cell carcinoma with solitary kidney. Monitor urine output  12. Anemia of chronic disease. Continue Aranesp    Hemoglobin 9.4 on 12/5   Labs pending    Labs ordered for Monday   Continue to monitor 13. Pancytopenia/thrombocytopenia. Followed hematology in [facility]    Persistent, but stable on 12/5   Labs pending   Labs ordered for Monday 14. History of Parkinson's disease. Home Sinemet resumed on 12/7  15. HTN   Hydralazine increased on 12/6    Relatively controlled on 12/7 16. Hypoalbuminemia    Supplement initiated on 12/5 17. PNA   Left lower lobe, chest x-ray reviewed on 12/5   Levaquin started on 12/5  LOS (Days) 3 A FACE TO FACE EVALUATION WAS PERFORMED  Gertrude Bucks Lorie Phenix 12/02/2017 8:53 AM

## 2017-12-03 MED ORDER — TAMSULOSIN HCL 0.4 MG PO CAPS
0.4000 mg | ORAL_CAPSULE | Freq: Every day | ORAL | Status: DC
  Administered 2017-12-03 – 2017-12-06 (×4): 0.4 mg via ORAL
  Filled 2017-12-03 (×4): qty 1

## 2017-12-03 NOTE — Progress Notes (Signed)
[place] PHYSICAL MEDICINE & REHABILITATION     PROGRESS NOTE  Subjective/Complaints:   Per son in law nocturia x 6 , prior hx nocturia x 3 at home  ROS: Appears to deny CP, SOB, nausea, vomiting, diarrhea..  Objective: Vital Signs: Blood pressure (!) 155/62, pulse 78, temperature 98.6 F (37 C), temperature source Oral, resp. rate 18, weight 69 kg (152 lb 1.9 oz), SpO2 95 %. Dg Chest 2 View  Result Date: 12/01/2017 CLINICAL DATA:  Off and shortness of breath for several days. History of COPD, chronic renal insufficiency, peripheral vascular disease, former smoker. EXAM: CHEST  2 VIEW COMPARISON:  PA and lateral chest x-ray of November 30, 2017 FINDINGS: The lungs are adequately inflated. There is persistent increased density in the left lower lobe. The interstitial markings elsewhere are coarse. The cardiac silhouette is enlarged. The central pulmonary vascularity is prominent. There is calcification in the wall of the tortuous thoracic aorta which is unchanged. An abdominal aortic stent graft is visible. The bony thorax exhibits no acute abnormality. IMPRESSION: Persistent left lower lobe atelectasis or pneumonia. Chronic bronchitic changes, stable. Cardiomegaly with central pulmonary vascular congestion, stable. Thoracic aortic atherosclerosis. Electronically Signed   By: Michah  Martinique M.D.   On: 12/01/2017 10:06   No results for input(s): WBC, HGB, HCT, PLT in the last 72 hours. Recent Labs    12/02/17 0546  NA 140  K 4.4  CL 106  GLUCOSE 105*  BUN 55*  CREATININE 2.60*  CALCIUM 8.9   CBG (last 3)  No results for input(s): GLUCAP in the last 72 hours.  Wt Readings from Last 3 Encounters:  11/30/17 69 kg (152 lb 1.9 oz)  11/28/17 70.8 kg (156 lb 1.4 oz)  11/02/17 72.7 kg (160 lb 4 oz)    Physical Exam:  BP (!) 155/62 (BP Location: Right Arm)   Pulse 78   Temp 98.6 F (37 C) (Oral)   Resp 18   Wt 69 kg (152 lb 1.9 oz)   SpO2 95%   BMI 23.13 kg/m  Constitutional: He  appears well-developed. Frail  HENT: Normocephalic and atraumatic.  Eyes: EOM are normal. No discharge.  Cardiovascular: Irregularly irregular. No JVD Respiratory: Limited inspiratory effort.  Upper airway sounds.  GI: Bowel sounds are normal. He exhibits no distension/nontender  Musculoskeletal: He exhibits edema (LE). He exhibits no tenderness.  Neurological: Not oriented to date. Motor: B/l UE 4-/5 proximal to distal  B/l LE: Hip flexion 2-/5, knee extension 3/5, ADF/PF 4-/5 HOH Dysarthria. Skin. Warm and dry. Intact.  Psych: Unable to thoroughly assess due to cognition and daughters continual interjection  Assessment/Plan: 1. Functional deficits secondary to debility which require 3+ hours per day of interdisciplinary therapy in a comprehensive inpatient rehab setting. Physiatrist is providing close team supervision and 24 hour management of active medical problems listed below. Physiatrist and rehab team continue to assess barriers to discharge/monitor patient progress toward functional and medical goals.  Function:  Bathing Bathing position   Position: Wheelchair/chair at sink  Bathing parts Body parts bathed by patient: Right arm, Left arm, Chest, Abdomen, Right upper leg, Left upper leg, Right lower leg, Left lower leg, Front perineal area, Buttocks Body parts bathed by helper: Back  Bathing assist Assist Level: Touching or steadying assistance(Pt > 75%)      Upper Body Dressing/Undressing Upper body dressing   What is the patient wearing?: Pull over shirt/dress     Pull over shirt/dress - Perfomed by patient: Put head through opening  Pull over shirt/dress - Perfomed by helper: Thread/unthread right sleeve, Thread/unthread left sleeve, Pull shirt over trunk        Upper body assist Assist Level: (MaxA)      Lower Body Dressing/Undressing Lower body dressing   What is the patient wearing?: Pants, Underwear, Non-skid slipper socks   Underwear - Performed by  helper: Thread/unthread right underwear leg, Thread/unthread left underwear leg, Pull underwear up/down   Pants- Performed by helper: Thread/unthread right pants leg, Thread/unthread left pants leg, Pull pants up/down   Non-skid slipper socks- Performed by helper: Don/doff right sock, Don/doff left sock                  Lower body assist Assist for lower body dressing: (MaxA )      Toileting Toileting     Toileting steps completed by helper: Adjust clothing prior to toileting, Performs perineal hygiene, Adjust clothing after toileting Toileting Assistive Devices: Grab bar or rail  Toileting assist Assist level: (MaxA )   Transfers Chair/bed transfer   Chair/bed transfer method: Stand pivot Chair/bed transfer assist level: Moderate assist (Pt 50 - 74%/lift or lower) Chair/bed transfer assistive device: Walker, Air cabin crew     Max distance: 20' Assist level: Touching or steadying assistance (Pt > 75%)   Wheelchair   Type: Manual Max wheelchair distance: 50 ft Assist Level: Supervision or verbal cues  Cognition Comprehension Comprehension assist level: Understands basic 75 - 89% of the time/ requires cueing 10 - 24% of the time  Expression Expression assist level: Expresses basic 50 - 74% of the time/requires cueing 25 - 49% of the time. Needs to repeat parts of sentences.  Social Interaction Social Interaction assist level: Interacts appropriately 50 - 74% of the time - May be physically or verbally inappropriate.  Problem Solving Problem solving assist level: Solves basic 25 - 49% of the time - needs direction more than half the time to initiate, plan or complete simple activities  Memory Memory assist level: Recognizes or recalls 25 - 49% of the time/requires cueing 50 - 75% of the time    Medical Problem List and Plan:  1. Debility secondary to acute respiratory failure with hypoxia    Continue CIR PT, OT, SLP 2. DVT  Prophylaxis/Anticoagulation: SCDs. Monitor for any signs of DVT  3. Pain Management: Oxycodone. Monitor mental status  4. Mood/dementia: Aricept 5 mg daily  5. Neuropsych: This patient is capable of making decisions on his own behalf.  6. Skin/Wound Care: Routine skin checks  7. Fluids/Electrolytes/Nutrition: Routine I&O's    Currently on D3 thins. 8. New onset atrial fibrillation. Follow-up per cardiology services. Currently not a candidate for Coumadin therapy. Patient is asymptomatic  9. Acute on chronic kidney disease stage IV as well as hyperkalemia. Follow-up per renal services. Patient was using Kayexalate 15 g 2 times a week Mondays and Fridays prior to admission. Follow electrolytes closely. He is followed by [facility] nephrology [place], [place] Dr. Dimas Aguas    Creatinine 2.60 on 12/7   Potassium 4.4 on 12/7   Labs ordered for Monday    Nephrology notes reviewed, appreciate recs, signed off.     Continue to monitor 10. History of AAA with concern for endoleak. Follow-up interventional radiology no leak identified on workup  11. History of renal cell carcinoma with solitary kidney. Monitor urine output  12. Anemia of chronic disease. Continue Aranesp    Hemoglobin 9.4 on 12/5   Labs pending  Labs ordered for Monday   Continue to monitor 13. Pancytopenia/thrombocytopenia. Followed hematology in [facility]    Persistent, but stable on 12/5   Labs pending   Labs ordered for Monday 14. History of Parkinson's disease. Home Sinemet resumed on 12/7  15. HTN   Hydralazine increased on 12/6    Relatively controlled on 12/8, mild sys elevation  Vitals:   12/02/17 2142 12/03/17 0521  BP: (!) 153/76 (!) 155/62  Pulse: 75 78  Resp:  18  Temp:  98.6 F (37 C)  SpO2:  95%   16. Hypoalbuminemia    Supplement initiated on 12/5 17. PNA   Left lower lobe, chest x-ray reviewed on 12/5   Levaquin started on 12/5- One week course d/w son in law 33.   Urinary freq mainly at noc trial flomax, UA C and S neg LOS (Days) 4 A FACE TO FACE EVALUATION WAS PERFORMED  Charlett Blake 12/03/2017 7:32 AM

## 2017-12-04 ENCOUNTER — Inpatient Hospital Stay (HOSPITAL_COMMUNITY): Payer: Medicare Other

## 2017-12-04 ENCOUNTER — Inpatient Hospital Stay (HOSPITAL_COMMUNITY): Payer: Medicare Other | Admitting: Occupational Therapy

## 2017-12-04 DIAGNOSIS — R351 Nocturia: Secondary | ICD-10-CM

## 2017-12-04 DIAGNOSIS — N401 Enlarged prostate with lower urinary tract symptoms: Secondary | ICD-10-CM

## 2017-12-04 NOTE — Progress Notes (Signed)
Physical Therapy Session Note  Patient Details  Name: [name] MRN: [id number] Date of Birth: 1927-12-09  Today's Date: 12/04/2017 PT Individual Time: 3893-7342 PT Individual Time Calculation (min): 45 min   Short Term Goals: Week 1:  PT Short Term Goal 1 (Week 1): Pt will perform all bed mobility with min assist PT Short Term Goal 2 (Week 1): Pt will perform sit<>stand with min assist PT Short Term Goal 3 (Week 1): Pt will ambulate x 50 ft with min assist PT Short Term Goal 4 (Week 1): Pt will ascend/descend 3 inch step with mod assist  Skilled Therapeutic Interventions/Progress Updates:    Pt seated in w/c upon PT arrival, agreeable to therapy tx and denies pain. Pt ambulated from room<>gym x 150 ft each way with RW and min assist, verbal cues for increased step length and obstacle navigation. Pt worked on dynamic standing balance without UE support: pt completed peg board puzzle with mod cues for correct placement, pt participated in ball toss and pt performed 2 x 10 marches. Pt ambulated back to room and left seated in w/c with needs in reach and son in law present.   Therapy Documentation Precautions:  Precautions Precautions: Fall Restrictions Weight Bearing Restrictions: No   See Function Navigator for Current Functional Status.   Therapy/Group: Individual Therapy  Netta Corrigan, PT, DPT 12/04/2017, 3:47 PM

## 2017-12-04 NOTE — Progress Notes (Signed)
Occupational Therapy Session Note  Patient Details  Name: [name] MRN: [id number] Date of Birth: 12-17-1927  Today's Date: 12/04/2017 OT Individual Time: 0700-0800 OT Individual Time Calculation (min): 60 min    Short Term Goals: Week 1:  OT Short Term Goal 1 (Week 1): Pt will perform LB bathing sit to stand with min assist for 2 consecutive sessions.  OT Short Term Goal 2 (Week 1): Pt will perform UB dressing with supervision to donn pullover shirt.  OT Short Term Goal 3 (Week 1): Pt will perform LB dressing sit to stand with mod assist.  OT Short Term Goal 4 (Week 1): Pt will complete toilet transfer with min assist stand pivot with use of the RW for support.   Skilled Therapeutic Interventions/Progress Updates:    1:1. Pt and son present for session with no c/o pain. Pt ambulates with RW with CGA to w/c at sink. PT washes at sink with min A for balance while washing peri area. PT able to cross BLE to wash feet. Pt able to doff shirt, but when donning shirt requires A to orient shirt, A to problem solve where to thread arms into sleeve and A to pull L sleeve past elbow as it gets caught on bracelet. Pt able to thread RLE into pants with cue to cross LE, however requires A to thread LLE using same technique. Pt dons socks by threading toes in while foot mounted on stool, and transitioning to crossed LE with touching A to pull sock past heel. Pt able to carry over positioning wihtout VC to don second sock. Pt dons shoes wit A to lift collapsed heel of shoe. Exited session with pt seated in w/c with son present.  Therapy Documentation Precautions:  Precautions Precautions: Fall Restrictions Weight Bearing Restrictions: No See Function Navigator for Current Functional Status.   Therapy/Group: Individual Therapy  Tonny Branch 12/04/2017, 7:55 AM

## 2017-12-04 NOTE — Progress Notes (Signed)
Occupational Therapy Session Note  Patient Details  Name: [name] MRN: [id number] Date of Birth: 08-20-1927  Today's Date: 12/04/2017 OT Individual Time: 1400-1443 OT Individual Time Calculation (min): 43 min    Short Term Goals: Week 1:  OT Short Term Goal 1 (Week 1): Pt will perform LB bathing sit to stand with min assist for 2 consecutive sessions.  OT Short Term Goal 2 (Week 1): Pt will perform UB dressing with supervision to donn pullover shirt.  OT Short Term Goal 3 (Week 1): Pt will perform LB dressing sit to stand with mod assist.  OT Short Term Goal 4 (Week 1): Pt will complete toilet transfer with min assist stand pivot with use of the RW for support.   Skilled Therapeutic Interventions/Progress Updates:    Tx focus on initiation/problem solving, balance, and functional ambulation with and without device during IADL participation.   Pt greeted in w/c with son-in-law present, zipping up jacket. Agreeable to tx. He ambulated with RW to therapy apartment with Min A and cues for upright posture and forward gaze. Once in apartment, had pt engage in bedmaking. He exhibited difficultly with sequencing/problem solving while donning pillowcases. Decreased carryover of learning when donning second pillowcase. Pt sequencing steps of bedmaking with mod  questioning/instructional cues. He ambulated around bed with Min A, bending out of base of support to apply sheets and comforter. Later in session, pt with increased attention to detail and when asked if something looked right he'd respond "No" and would correct sheet alignment or unfold comforter. Mod cues for big movements, including during power up from low couch during rest breaks. Pt then ambulated back to room in manner as written above, mod-max questioning cues for locating room. He proceeded into bathroom with Min A and RW and transferred to toilet. Pt left with son-in-law, instructed him to ring call bell for staff assist once pt was  finished voiding.    Therapy Documentation Precautions:  Precautions Precautions: Fall Restrictions Weight Bearing Restrictions: No Vital Signs: Therapy Vitals Temp: 98.1 F (36.7 C) Temp Source: Oral Pulse Rate: 80 Resp: 16 BP: 126/66 Patient Position (if appropriate): Sitting Oxygen Therapy SpO2: 95 % O2 Device: Not Delivered Pain: No c/o pain during tx    ADL:     See Function Navigator for Current Functional Status.   Therapy/Group: Individual Therapy  Samiyah Stupka A Datha Kissinger 12/04/2017, 4:22 PM

## 2017-12-04 NOTE — Progress Notes (Signed)
Occupational Therapy Note  Patient Details  Name: [name] MRN: [id number] Date of Birth: 06/21/1927  Today's Date: 12/04/2017 OT Individual Time: 1030-1130 OT Individual Time Calculation (min): 60 min   Pt denies pain Individual Therapy  Pt resting in w/c with son-in-law present.  Focus on sit<>stand, standing balance, and functional amb with RW.  Pt performs sit<>stand with min A/steady A and max verbal cues for sequencing.  Pt stood 5 min X 3 while retrieving/replacing cards on card board.  Pt practiced sit<>stand X 5 with progressively less physical assistance and verbal cues.  Pt required max verbal cues for sequencing, however, when practicing sit<>stand 15 mins later.  Pt returned to room and remained in w/c with son-in-law present.    Leotis Shames [facility] 12/04/2017, 12:12 PM

## 2017-12-04 NOTE — Progress Notes (Signed)
[place] PHYSICAL MEDICINE & REHABILITATION     PROGRESS NOTE  Subjective/Complaints:   Voiding freq with low residual  ROS: Appears to deny CP, SOB, nausea, vomiting, diarrhea..  Objective: Vital Signs: Blood pressure (!) 152/65, pulse 70, temperature 98.4 F (36.9 C), temperature source Oral, resp. rate 18, weight 67.9 kg (149 lb 11.1 oz), SpO2 93 %. No results found. No results for input(s): WBC, HGB, HCT, PLT in the last 72 hours. Recent Labs    12/02/17 0546  NA 140  K 4.4  CL 106  GLUCOSE 105*  BUN 55*  CREATININE 2.60*  CALCIUM 8.9   CBG (last 3)  No results for input(s): GLUCAP in the last 72 hours.  Wt Readings from Last 3 Encounters:  12/04/17 67.9 kg (149 lb 11.1 oz)  11/28/17 70.8 kg (156 lb 1.4 oz)  11/02/17 72.7 kg (160 lb 4 oz)    Physical Exam:  BP (!) 152/65 (BP Location: Right Arm)   Pulse 70   Temp 98.4 F (36.9 C) (Oral)   Resp 18   Wt 67.9 kg (149 lb 11.1 oz)   SpO2 93%   BMI 22.76 kg/m  Constitutional: He appears well-developed. Frail  HENT: Normocephalic and atraumatic.  Eyes: EOM are normal. No discharge.  Cardiovascular: Irregularly irregular. No JVD Respiratory: Limited inspiratory effort.  Upper airway sounds.  GI: Bowel sounds are normal. He exhibits no distension/nontender  Musculoskeletal: He exhibits edema (LE). He exhibits no tenderness.  Neurological: Not oriented to date. Motor: B/l UE 4-/5 proximal to distal  B/l LE: Hip flexion 2-/5, knee extension 3/5, ADF/PF 4-/5 HOH Dysarthria. Skin. Warm and dry. Intact.  Psych: Unable to thoroughly assess due to cognition and daughters continual interjection  Assessment/Plan: 1. Functional deficits secondary to debility which require 3+ hours per day of interdisciplinary therapy in a comprehensive inpatient rehab setting. Physiatrist is providing close team supervision and 24 hour management of active medical problems listed below. Physiatrist and rehab team continue to assess  barriers to discharge/monitor patient progress toward functional and medical goals.  Function:  Bathing Bathing position   Position: Wheelchair/chair at sink  Bathing parts Body parts bathed by patient: Right arm, Left arm, Chest, Abdomen, Right upper leg, Left upper leg, Right lower leg, Left lower leg, Front perineal area, Buttocks Body parts bathed by helper: Back  Bathing assist Assist Level: Touching or steadying assistance(Pt > 75%)      Upper Body Dressing/Undressing Upper body dressing   What is the patient wearing?: Pull over shirt/dress     Pull over shirt/dress - Perfomed by patient: Put head through opening Pull over shirt/dress - Perfomed by helper: Thread/unthread right sleeve, Thread/unthread left sleeve, Pull shirt over trunk        Upper body assist Assist Level: (MaxA)      Lower Body Dressing/Undressing Lower body dressing   What is the patient wearing?: Pants, Underwear, Non-skid slipper socks   Underwear - Performed by helper: Thread/unthread right underwear leg, Thread/unthread left underwear leg, Pull underwear up/down   Pants- Performed by helper: Thread/unthread right pants leg, Thread/unthread left pants leg, Pull pants up/down   Non-skid slipper socks- Performed by helper: Don/doff right sock, Don/doff left sock                  Lower body assist Assist for lower body dressing: (MaxA )      Toileting Toileting     Toileting steps completed by helper: Adjust clothing prior to toileting, Performs perineal  hygiene, Adjust clothing after toileting Toileting Assistive Devices: Grab bar or rail  Toileting assist Assist level: Touching or steadying assistance (Pt.75%)   Transfers Chair/bed transfer   Chair/bed transfer method: Stand pivot Chair/bed transfer assist level: Moderate assist (Pt 50 - 74%/lift or lower) Chair/bed transfer assistive device: Walker, Air cabin crew     Max distance: 20' Assist level:  Touching or steadying assistance (Pt > 75%)   Wheelchair   Type: Manual Max wheelchair distance: 50 ft Assist Level: Supervision or verbal cues  Cognition Comprehension Comprehension assist level: Understands basic 75 - 89% of the time/ requires cueing 10 - 24% of the time  Expression Expression assist level: Expresses basic 90% of the time/requires cueing < 10% of the time.  Social Interaction Social Interaction assist level: Interacts appropriately 90% of the time - Needs monitoring or encouragement for participation or interaction.  Problem Solving Problem solving assist level: Solves basic 90% of the time/requires cueing < 10% of the time  Memory Memory assist level: Recognizes or recalls 90% of the time/requires cueing < 10% of the time    Medical Problem List and Plan:  1. Debility secondary to acute respiratory failure with hypoxia    Continue CIR PT, OT, SLP 2. DVT Prophylaxis/Anticoagulation: SCDs. Monitor for any signs of DVT  3. Pain Management: Oxycodone. Monitor mental status  4. Mood/dementia: Aricept 5 mg daily  5. Neuropsych: This patient is capable of making decisions on his own behalf.  6. Skin/Wound Care: Routine skin checks  7. Fluids/Electrolytes/Nutrition: Routine I&O's    Currently on D3 thins. 8. New onset atrial fibrillation. Follow-up per cardiology services. Currently not a candidate for Coumadin therapy. Patient is asymptomatic  9. Acute on chronic kidney disease stage IV as well as hyperkalemia. Follow-up per renal services. Patient was using Kayexalate 15 g 2 times a week Mondays and Fridays prior to admission. Follow electrolytes closely. He is followed by [facility] nephrology [place], [place] Dr. Dimas Aguas    Creatinine 2.60 on 12/7   Potassium 4.4 on 12/7   Labs ordered for Monday    Nephrology notes reviewed, appreciate recs, signed off.     Continue to monitor 10. History of AAA with concern for endoleak. Follow-up interventional  radiology no leak identified on workup  11. History of renal cell carcinoma with solitary kidney. Monitor urine output  12. Anemia of chronic disease. Per Nephro no further Fe or Epo needed   Hemoglobin 9.4 on 12/5   Labs pending    Labs ordered for Monday   Continue to monitor 13. Pancytopenia/thrombocytopenia. Followed hematology in [facility]    Persistent, but stable on 12/5   Labs pending   Labs ordered for Monday 14. History of Parkinson's disease. Home Sinemet resumed on 12/7  15. HTN   Hydralazine increased on 12/6    Relatively controlled on 12/8, mild sys elevation  Vitals:   12/03/17 2057 12/04/17 0447  BP: 133/65 (!) 152/65  Pulse:  70  Resp:  18  Temp:  98.4 F (36.9 C)  SpO2:  93%   16. Hypoalbuminemia    Supplement initiated on 12/5 17. PNA   Left lower lobe, chest x-ray reviewed on 12/5   Levaquin started on 12/5- One week course d/w son in law 50.  Urinary freq mainly at noc trial flomax, UA C and S neg, low residual , no improvement after first dose of flomax- may try for another 1-2 nites, consider  myrbetriq for spastic bladder associated with Parkinson's LOS (Days) 5 A FACE TO FACE EVALUATION WAS PERFORMED  Charlett Blake 12/04/2017 9:42 AM

## 2017-12-05 ENCOUNTER — Inpatient Hospital Stay (HOSPITAL_COMMUNITY): Payer: Medicare Other | Admitting: Occupational Therapy

## 2017-12-05 ENCOUNTER — Inpatient Hospital Stay (HOSPITAL_COMMUNITY): Payer: Medicare Other

## 2017-12-05 DIAGNOSIS — N401 Enlarged prostate with lower urinary tract symptoms: Secondary | ICD-10-CM

## 2017-12-05 DIAGNOSIS — R351 Nocturia: Secondary | ICD-10-CM

## 2017-12-05 LAB — CBC WITH DIFFERENTIAL/PLATELET
BASOS ABS: 0 10*3/uL (ref 0.0–0.1)
BASOS PCT: 1 %
EOS ABS: 0.1 10*3/uL (ref 0.0–0.7)
Eosinophils Relative: 2 %
HEMATOCRIT: 28.9 % — AB (ref 39.0–52.0)
HEMOGLOBIN: 9.1 g/dL — AB (ref 13.0–17.0)
Lymphocytes Relative: 20 %
Lymphs Abs: 0.7 10*3/uL (ref 0.7–4.0)
MCH: 30.5 pg (ref 26.0–34.0)
MCHC: 31.5 g/dL (ref 30.0–36.0)
MCV: 97 fL (ref 78.0–100.0)
Monocytes Absolute: 0.3 10*3/uL (ref 0.1–1.0)
Monocytes Relative: 8 %
NEUTROS ABS: 2.3 10*3/uL (ref 1.7–7.7)
NEUTROS PCT: 69 %
Platelets: 133 10*3/uL — ABNORMAL LOW (ref 150–400)
RBC: 2.98 MIL/uL — ABNORMAL LOW (ref 4.22–5.81)
RDW: 14.1 % (ref 11.5–15.5)
WBC: 3.3 10*3/uL — ABNORMAL LOW (ref 4.0–10.5)

## 2017-12-05 LAB — BASIC METABOLIC PANEL
ANION GAP: 7 (ref 5–15)
BUN: 50 mg/dL — ABNORMAL HIGH (ref 6–20)
CALCIUM: 8.6 mg/dL — AB (ref 8.9–10.3)
CO2: 26 mmol/L (ref 22–32)
Chloride: 106 mmol/L (ref 101–111)
Creatinine, Ser: 2.52 mg/dL — ABNORMAL HIGH (ref 0.61–1.24)
GFR, EST AFRICAN AMERICAN: 25 mL/min — AB (ref >60)
GFR, EST NON AFRICAN AMERICAN: 21 mL/min — AB (ref >60)
Glucose, Bld: 102 mg/dL — ABNORMAL HIGH (ref 65–99)
Potassium: 4.6 mmol/L (ref 3.5–5.1)
SODIUM: 139 mmol/L (ref 135–145)

## 2017-12-05 NOTE — Progress Notes (Signed)
[place] PHYSICAL MEDICINE & REHABILITATION     PROGRESS NOTE  Subjective/Complaints:  Patient seen sitting up, working with SLP this AM. He states he slept well overnight. Family at bedside indicates the patient is doing significantly better since he been in rehabilitation. Son-in-law with questions regarding length of antibiotic therapy.  ROS: Denies CP, SOB, nausea, vomiting, diarrhea..  Objective: Vital Signs: Blood pressure 140/66, pulse 69, temperature 98.5 F (36.9 C), temperature source Oral, resp. rate 18, weight 69 kg (152 lb 1.9 oz), SpO2 94 %. No results found. No results for input(s): WBC, HGB, HCT, PLT in the last 72 hours. No results for input(s): NA, K, CL, GLUCOSE, BUN, CREATININE, CALCIUM in the last 72 hours.  Invalid input(s): CO CBG (last 3)  No results for input(s): GLUCAP in the last 72 hours.  Wt Readings from Last 3 Encounters:  12/05/17 69 kg (152 lb 1.9 oz)  11/28/17 70.8 kg (156 lb 1.4 oz)  11/02/17 72.7 kg (160 lb 4 oz)    Physical Exam:  BP 140/66 (BP Location: Right Arm)   Pulse 69   Temp 98.5 F (36.9 C) (Oral)   Resp 18   Wt 69 kg (152 lb 1.9 oz)   SpO2 94%   BMI 23.13 kg/m  Constitutional: He appears well-developed. Frail  HENT: Normocephalic and atraumatic.  Eyes: EOM are normal. No discharge.  Cardiovascular: Irregularly irregular. No JVD Respiratory: Limited inspiratory effort.  Clear. GI: Bowel sounds are normal. He exhibits no distension/nontender  Musculoskeletal: He exhibits edema (LE). He exhibits no tenderness.  Neurological: Not oriented to date. Motor: B/l UE 4-/5 proximal to distal (stable)  B/l LE: Hip flexion 2-/5, knee extension 3/5, ADF/PF 4-/5 (improving) HOH Dysarthria. Skin. Warm and dry. Intact.  Psych: Mood and behavior appear to be normal  Assessment/Plan: 1. Functional deficits secondary to debility which require 3+ hours per day of interdisciplinary therapy in a comprehensive inpatient rehab  setting. Physiatrist is providing close team supervision and 24 hour management of active medical problems listed below. Physiatrist and rehab team continue to assess barriers to discharge/monitor patient progress toward functional and medical goals.  Function:  Bathing Bathing position   Position: Wheelchair/chair at sink  Bathing parts Body parts bathed by patient: Right arm, Left arm, Chest, Abdomen, Right upper leg, Left upper leg, Right lower leg, Left lower leg, Front perineal area, Buttocks Body parts bathed by helper: Back  Bathing assist Assist Level: Touching or steadying assistance(Pt > 75%)      Upper Body Dressing/Undressing Upper body dressing   What is the patient wearing?: Pull over shirt/dress     Pull over shirt/dress - Perfomed by patient: Put head through opening Pull over shirt/dress - Perfomed by helper: Thread/unthread right sleeve, Thread/unthread left sleeve, Pull shirt over trunk        Upper body assist Assist Level: (MaxA)      Lower Body Dressing/Undressing Lower body dressing   What is the patient wearing?: Pants, Underwear, Non-skid slipper socks   Underwear - Performed by helper: Thread/unthread right underwear leg, Thread/unthread left underwear leg, Pull underwear up/down   Pants- Performed by helper: Thread/unthread right pants leg, Thread/unthread left pants leg, Pull pants up/down   Non-skid slipper socks- Performed by helper: Don/doff right sock, Don/doff left sock                  Lower body assist Assist for lower body dressing: (MaxA )      Toileting Toileting  Toileting steps completed by helper: Adjust clothing prior to toileting, Performs perineal hygiene, Adjust clothing after toileting Toileting Assistive Devices: Grab bar or rail  Toileting assist Assist level: Touching or steadying assistance (Pt.75%)   Transfers Chair/bed transfer   Chair/bed transfer method: Stand pivot Chair/bed transfer assist level:  Touching or steadying assistance (Pt > 75%) Chair/bed transfer assistive device: Walker, Air cabin crew     Max distance: 150 ft Assist level: Touching or steadying assistance (Pt > 75%)   Wheelchair   Type: Manual Max wheelchair distance: 50 ft Assist Level: Supervision or verbal cues  Cognition Comprehension Comprehension assist level: Understands basic 90% of the time/cues < 10% of the time  Expression Expression assist level: Expresses basic 75 - 89% of the time/requires cueing 10 - 24% of the time. Needs helper to occlude trach/needs to repeat words.  Social Interaction Social Interaction assist level: Interacts appropriately 75 - 89% of the time - Needs redirection for appropriate language or to initiate interaction.  Problem Solving Problem solving assist level: Solves basic 75 - 89% of the time/requires cueing 10 - 24% of the time  Memory Memory assist level: Recognizes or recalls 75 - 89% of the time/requires cueing 10 - 24% of the time    Medical Problem List and Plan:  1. Debility secondary to acute respiratory failure with hypoxia    Continue CIR  2. DVT Prophylaxis/Anticoagulation: SCDs. Monitor for any signs of DVT  3. Pain Management: Oxycodone. Monitor mental status  4. Mood/dementia: Aricept 5 mg daily  5. Neuropsych: This patient is capable of making decisions on his own behalf.  6. Skin/Wound Care: Routine skin checks  7. Fluids/Electrolytes/Nutrition: Routine I&O's    Currently on D3 thins. 8. New onset atrial fibrillation. Follow-up per cardiology services. Currently not a candidate for Coumadin therapy. Patient is asymptomatic  9. Acute on chronic kidney disease stage IV as well as hyperkalemia. Follow-up per renal services. Patient was using Kayexalate 15 g 2 times a week Mondays and Fridays prior to admission. Follow electrolytes closely. He is followed by [facility] nephrology [place], [place] Dr. Dimas Aguas    Creatinine 2.60  on 12/7   Potassium 4.4 on 12/7   Labs pending    Nephrology notes reviewed, appreciate recs, signed off.    Continue to monitor 10. History of AAA with concern for endoleak. Follow-up interventional radiology no leak identified on workup  11. History of renal cell carcinoma with solitary kidney. Monitor urine output  12. Anemia of chronic disease. Per Nephro no further Fe or Epo needed   Hemoglobin 9.4 on 12/5   Labs pending   Continue to monitor 13. Pancytopenia/thrombocytopenia. Followed hematology in [facility]    Persistent, but stable on 12/5   Labs pending 14. History of Parkinson's disease. Home Sinemet resumed on 12/7  15. HTN   Hydralazine increased on 12/6    Relatively controlled on 12/10 Vitals:   12/04/17 2104 12/05/17 0548  BP: 134/64 140/66  Pulse:  69  Resp:  18  Temp:  98.5 F (36.9 C)  SpO2:  94%   16. Hypoalbuminemia    Supplement initiated on 12/5 17. PNA   Left lower lobe, chest x-ray reviewed on 12/5   Levaquin started on 12/5- 12/12 18.  Urinary freq mainly at noc    Cont trial flomax, UA C and S neg, low residual ,    Trial flomax for 1 more day, will consider myrbetriq  for spastic bladder associated with Parkinson's  LOS (Days) 6 A FACE TO FACE EVALUATION WAS PERFORMED  Candace Begue Lorie Phenix 12/05/2017 8:40 AM

## 2017-12-05 NOTE — Progress Notes (Signed)
Physical Therapy Session Note  Patient Details  Name: [name] MRN: [id number] Date of Birth: 01-Jul-1927  Today's Date: 12/05/2017 PT Individual Time: 1300-1400, 7948-0165  PT Individual Time Calculation (min): 60 min , 40 min   Short Term Goals: Week 1:  PT Short Term Goal 1 (Week 1): Pt will perform all bed mobility with min assist PT Short Term Goal 2 (Week 1): Pt will perform sit<>stand with min assist PT Short Term Goal 3 (Week 1): Pt will ambulate x 50 ft with min assist PT Short Term Goal 4 (Week 1): Pt will ascend/descend 3 inch step with mod assist  Skilled Therapeutic Interventions/Progress Updates:    Session 1: Pt seated in w/c upon PT arrival, agreeable to therapy tx and denies pain. Pt ambulated from room>gym x 150 ft with RW and supervision, verbal cues for increased step length and awareness. Session focused on dynamic standing balance, LE strengthening and gait. Pt performed x 10 sit<>stands without UE support working on technique and LE strengthening. Pt worked on dynamic standing balance without UE support working on: forwards ambulation, backwards ambulation, and sidestepping. Pt seated edge of mat performed x 20 marches and 2 x 10 LAQ. Pt ambulated within the unit x 100 ft, x 120 ft and x 150 ft working on endurance, increased step length, increased gait speed and navigating obstacles using RW and with supervision. Pt used the nustep x 8 minutes for global strengthening and endurance on workload 4. Pt left supine in bed with needs in reach and son in law present.   Session 2: Pt seated in w/c upon PT arrival, agreeable to therapy tx and denies pain. Pt ambulated from room<>gym x 150 ft each way with RW and supervision, verbal cues for increased step length. Pt worked on dynamic standing balance without UE support in order to perform toe taps on 4inch step, sidestepping, ambulating around cones, ambulating over 4 inch step and stepping over objects. Pt left seated in w/c  at end of session with needs in reach.   Therapy Documentation Precautions:  Precautions Precautions: Fall Restrictions Weight Bearing Restrictions: No   See Function Navigator for Current Functional Status.   Therapy/Group: Individual Therapy  Netta Corrigan, PT, DPT 12/05/2017, 2:00 PM

## 2017-12-05 NOTE — Progress Notes (Signed)
Speech Language Pathology Daily Session Note  Patient Details  Name: CLEVEN JANSMA MRN: [id number] Date of Birth: Aug 04, 1927  Today's Date: 12/05/2017 SLP Individual Time: 0803-0900 SLP Individual Time Calculation (min): 57 min  Short Term Goals: Week 1: SLP Short Term Goal 1 (Week 1): Pt will demonstrate sustained attention to functional tasks for 5 mintues with Max verbal cues. SLP Short Term Goal 2 (Week 1): Pt will demonstrate follow 1 step commands in 80% of opportunies with Max A verbal cues. SLP Short Term Goal 3 (Week 1): Pt will complete functional problem solving for basic and familar tasks with Max A multimodal cues. SLP Short Term Goal 4 (Week 1): Pt will utilize external memory aids to recall new, daily information with Max A verbal and question cues. SLP Short Term Goal 5 (Week 1): Pt will identify 2 congitive and 2 phsyical deficits with Mod A multimodal cues.  SLP Short Term Goal 6 (Week 1): Pt will consume trials of dys 3 textured foods with effective mastication and oral clearance with min overt s/s aspriation and Min A verbal cues to follow swallow strategies.  Skilled Therapeutic Interventions: Skilled ST services focused on swallow and cognitive skills. SLP facilitated trials with regular textured foods and thin liquids via straw, pt demonstrated appropriate oral clearnace with liquid wash and extended time. SLP upgraded diet to regular textured foods, medication whole with thin liquid and intermittent supervision, family member, son in-law agreed. SLP facilitated problem solving skills by idnentifying problem and sequencing 3 step picture cards, pt required max A question and verbal cues. Pt demonstrated ability to follow 1 step body directions with min A verbal cues and 2  Step directions with mod A verbal cues. Pt demonstrated ability to identify 2 physical and 2 cognitive impairments with mod A verba cues and overall increased processing time in response to question and  request of action. Pt demonstrated recall of therapy events with mod A verbal cues. SLP communicated with son in-law and he stated pt is close to baseline, suspect min-Mod impairment at baseline. Pt was left with son in-law in room. Recommend to continue skilled ST services.      Function: identfty Eating Eating   Modified Consistency Diet: No Eating Assist Level: Supervision or verbal cues   Eating Set Up Assist For: Opening containers;Cutting food       Cognition Comprehension Comprehension assist level: Understands basic 90% of the time/cues < 10% of the time  Expression   Expression assist level: Expresses basic 75 - 89% of the time/requires cueing 10 - 24% of the time. Needs helper to occlude trach/needs to repeat words.  Social Interaction Social Interaction assist level: Interacts appropriately 75 - 89% of the time - Needs redirection for appropriate language or to initiate interaction.  Problem Solving Problem solving assist level: Solves basic 75 - 89% of the time/requires cueing 10 - 24% of the time  Memory Memory assist level: Recognizes or recalls 75 - 89% of the time/requires cueing 10 - 24% of the time    Pain Pain Assessment Pain Assessment: No/denies pain  Therapy/Group: Individual Therapy  Adiah Guereca  [facility] 12/05/2017, 4:01 PM

## 2017-12-06 ENCOUNTER — Inpatient Hospital Stay (HOSPITAL_COMMUNITY): Payer: Medicare Other | Admitting: Speech Pathology

## 2017-12-06 ENCOUNTER — Inpatient Hospital Stay (HOSPITAL_COMMUNITY): Payer: Medicare Other

## 2017-12-06 ENCOUNTER — Inpatient Hospital Stay (HOSPITAL_COMMUNITY): Payer: Medicare Other | Admitting: Occupational Therapy

## 2017-12-06 NOTE — Progress Notes (Signed)
Physical Therapy Session Note  Patient Details  Name: [name] MRN: [id number] Date of Birth: 03-Dec-1927  Today's Date: 12/06/2017 PT Individual Time: 1300-1400, 3557-3220 PT Individual Time Calculation (min): 60 min, 43 min   Short Term Goals: Week 1:  PT Short Term Goal 1 (Week 1): Pt will perform all bed mobility with min assist PT Short Term Goal 2 (Week 1): Pt will perform sit<>stand with min assist PT Short Term Goal 3 (Week 1): Pt will ambulate x 50 ft with min assist PT Short Term Goal 4 (Week 1): Pt will ascend/descend 3 inch step with mod assist  Skilled Therapeutic Interventions/Progress Updates:    Session 1: Pt seated in w/c upon PT arrival, agreeable to therapy tx and denies pain. [name] present throughout the entire session. Pt ambulated from room<>gym x 150 ft each direction with RW and verbal cues for RW management and increased step length. Pt performed x 10 sit<>stands without UE support, emphasis on LE strengthening and full extension in standing. Pt worked on Programmer, systems step x 3 using RW and x 2 without AD, min assist and verbal cues for techniques/safety. Pt worked on dynamic standing balance without UE support in order to reach outside BOS to retreive horse shoes and throw them. Pt ambulated x 120 ft, x 100 ft with supervision and RW working on endurance and gait, pt requiring max verbal cues for route finding. Pt seated edge of mat performed 2 x 10 LAQ and x 20 marches with 3# ankle weight for LE strengthening also focusing on moving through entire ROM. Therapist performed 2 x 30 sec seated hamstring stretch and 2 x 30 sec supine hamstring stretch for muscle lengthening and ROM. Pt ambulated back to room and left seated in w/c with needs in reach, daughter present.   Session 2: Pt received from [place], agreeable to therapy tx and denies pain. Pt reported needing to use the bathroom. Pt ambulated within room to that bathroom without AD with CGA. Pt  performed all toileting and clothing management with supervision. Pt ambulated from room>gym x 100 ft without AD and with CGA. Pt ambulated x 300 ft on the treadmill on speed 0.6 x 5 minutes with UE support working on step length and endurance. Pt practiced car transfer x 2 with CGA, verbal cues for techniques and to minimize shuffling. Discussed d/c planning with daughter, recommendation for 24/7 supervision and continued therapy in outpatient setting. Pt ambulated back to room and left seated in w/c with needs in reach, daughter present.    Therapy Documentation Precautions:  Precautions Precautions: Fall Restrictions Weight Bearing Restrictions: No   See Function Navigator for Current Functional Status.   Therapy/Group: Individual Therapy  Netta Corrigan, PT, DPT 12/06/2017, 7:52 AM

## 2017-12-06 NOTE — Progress Notes (Addendum)
[place] PHYSICAL MEDICINE & REHABILITATION     PROGRESS NOTE  Subjective/Complaints:  Pt sitting up in bed this AM.  Daughter at bedside with several questions.  She states pt slept well overnight.   ROS: Denies CP, SOB, nausea, vomiting, diarrhea..  Objective: Vital Signs: Blood pressure (!) 151/74, pulse 69, temperature 98.4 F (36.9 C), temperature source Oral, resp. rate 16, weight 67.6 kg (149 lb 0.5 oz), SpO2 95 %. No results found. Recent Labs    12/05/17 1036  WBC 3.3*  HGB 9.1*  HCT 28.9*  PLT 133*   Recent Labs    12/05/17 1036  NA 139  K 4.6  CL 106  GLUCOSE 102*  BUN 50*  CREATININE 2.52*  CALCIUM 8.6*   CBG (last 3)  No results for input(s): GLUCAP in the last 72 hours.  Wt Readings from Last 3 Encounters:  12/06/17 67.6 kg (149 lb 0.5 oz)  11/28/17 70.8 kg (156 lb 1.4 oz)  11/02/17 72.7 kg (160 lb 4 oz)    Physical Exam:  BP (!) 151/74 (BP Location: Right Arm)   Pulse 69   Temp 98.4 F (36.9 C) (Oral)   Resp 16   Wt 67.6 kg (149 lb 0.5 oz)   SpO2 95%   BMI 22.66 kg/m  Constitutional: He appears well-developed. Frail  HENT: Normocephalic and atraumatic.  Eyes: EOM are normal. No discharge.  Cardiovascular: Irregularly irregular. No JVD Respiratory: Limited inspiratory effort.  Clear. GI: Bowel sounds are normal. He exhibits no distension/nontender  Musculoskeletal: He exhibits edema (LE). He exhibits no tenderness.  Neurological: Not oriented to date. Motor: B/l UE 4/5 proximal to distal  B/l LE: Hip flexion 2-/5, knee extension 3/5, ADF/PF 4-/5 (improving) HOH Dysarthria. Skin. Warm and dry. Intact.  Psych: Mood and behavior appear to be normal  Assessment/Plan: 1. Functional deficits secondary to debility which require 3+ hours per day of interdisciplinary therapy in a comprehensive inpatient rehab setting. Physiatrist is providing close team supervision and 24 hour management of active medical problems listed below. Physiatrist  and rehab team continue to assess barriers to discharge/monitor patient progress toward functional and medical goals.  Function:  Bathing Bathing position   Position: Wheelchair/chair at sink  Bathing parts Body parts bathed by patient: Right arm, Left arm, Chest, Abdomen, Right upper leg, Left upper leg, Right lower leg, Left lower leg, Front perineal area, Buttocks Body parts bathed by helper: Back  Bathing assist Assist Level: Touching or steadying assistance(Pt > 75%)      Upper Body Dressing/Undressing Upper body dressing   What is the patient wearing?: Pull over shirt/dress     Pull over shirt/dress - Perfomed by patient: Put head through opening Pull over shirt/dress - Perfomed by helper: Thread/unthread right sleeve, Thread/unthread left sleeve, Pull shirt over trunk        Upper body assist Assist Level: (MaxA)      Lower Body Dressing/Undressing Lower body dressing   What is the patient wearing?: Pants, Underwear, Non-skid slipper socks   Underwear - Performed by helper: Thread/unthread right underwear leg, Thread/unthread left underwear leg, Pull underwear up/down   Pants- Performed by helper: Thread/unthread right pants leg, Thread/unthread left pants leg, Pull pants up/down   Non-skid slipper socks- Performed by helper: Don/doff right sock, Don/doff left sock                  Lower body assist Assist for lower body dressing: (MaxA )      Toileting Toileting  Toileting steps completed by helper: Adjust clothing prior to toileting, Performs perineal hygiene, Adjust clothing after toileting Toileting Assistive Devices: Grab bar or rail  Toileting assist Assist level: Touching or steadying assistance (Pt.75%)   Transfers Chair/bed transfer   Chair/bed transfer method: Stand pivot, Ambulatory Chair/bed transfer assist level: Supervision or verbal cues Chair/bed transfer assistive device: Walker, Air cabin crew     Max  distance: 150 ft Assist level: Supervision or verbal cues   Wheelchair   Type: Manual Max wheelchair distance: 50 ft Assist Level: Supervision or verbal cues  Cognition Comprehension Comprehension assist level: Understands basic 90% of the time/cues < 10% of the time  Expression Expression assist level: Expresses basic 90% of the time/requires cueing < 10% of the time.  Social Interaction Social Interaction assist level: Interacts appropriately 90% of the time - Needs monitoring or encouragement for participation or interaction.  Problem Solving Problem solving assist level: Solves basic 90% of the time/requires cueing < 10% of the time  Memory Memory assist level: Recognizes or recalls 90% of the time/requires cueing < 10% of the time    Medical Problem List and Plan:  1. Debility secondary to acute respiratory failure with hypoxia    Continue CIR  2. DVT Prophylaxis/Anticoagulation: SCDs. Monitor for any signs of DVT  3. Pain Management: Oxycodone. Monitor mental status  4. Mood/dementia: Aricept 5 mg daily  5. Neuropsych: This patient is capable of making decisions on his own behalf.  6. Skin/Wound Care: Routine skin checks  7. Fluids/Electrolytes/Nutrition: Routine I&O's    Advanced to regular renal diet 8. New onset atrial fibrillation. Follow-up per cardiology services. Currently not a candidate for Coumadin therapy. Patient is asymptomatic  9. Acute on chronic kidney disease stage IV as well as hyperkalemia. Follow-up per renal services. Patient was using Kayexalate 15 g 2 times a week Mondays and Fridays prior to admission. Follow electrolytes closely. He is followed by [facility] nephrology [place], [place] Dr. Dimas Aguas    Creatinine 2.52 on 12/10   Potassium 4.6 on 12/10   Labs ordered for tomorrow    Nephrology notes reviewed, appreciate recs, signed off.    Continue to monitor 10. History of AAA with concern for endoleak. Follow-up interventional radiology no  leak identified on workup  11. History of renal cell carcinoma with solitary kidney. Monitor urine output  12. Anemia of chronic disease. Per Nephro no further Fe or Epo needed   Hemoglobin 9.1 on 12/10   Continue to monitor 13. Pancytopenia/thrombocytopenia. Followed hematology in [facility]: Improving    Persistent, but improving overall on 12/10 14. History of Parkinson's disease. Home Sinemet resumed on 12/7  15. HTN   Hydralazine increased on 12/6    Relatively controlled on 12/11 Vitals:   12/05/17 2147 12/06/17 0520  BP: 132/80 (!) 151/74  Pulse:  69  Resp:  16  Temp:  98.4 F (36.9 C)  SpO2:  95%   16. Hypoalbuminemia    Supplement initiated on 12/5 17. PNA   Left lower lobe, chest x-ray reviewed on 12/5   Levaquin started on 12/5- 12/12 18.  Urinary freq mainly at noc    Cont trial flomax, UA C and S neg, low residual ,    Cont flomax, improving   Will consider myrbetriq for spastic bladder associated with Parkinson's  >35 minutes spent with >25 with patient and family in counseling, coordination of care, education regarding medications, abx therapy, repeat imaging,  lab work, bladder function, discharge date, follow up appointments, etc.  LOS (Days) 7 A FACE TO [place]  12/06/2017 8:04 AM

## 2017-12-06 NOTE — Progress Notes (Signed)
Social Work Patient ID: [name], male   DOB: 05-23-1927, 83 y.o.   MRN: [id number]  Met with pt and daughter to discuss have asked for a day pass for them to go to his MD appointment on the 13th. Pt is making good progress in therapies and may be able to move up discharge date. Daughter aware still will recommend 24 hr supervision for home. Will await team's recommendation.

## 2017-12-06 NOTE — Progress Notes (Signed)
Speech Language Pathology Daily Session Note  Patient Details  Name: [name] MRN: [id number] Date of Birth: Jan 22, 1927  Today's Date: 12/06/2017 SLP Individual Time: 1405-1500 SLP Individual Time Calculation (min): 55 min  Short Term Goals: Week 1: SLP Short Term Goal 1 (Week 1): Pt will demonstrate sustained attention to functional tasks for 5 mintues with Max verbal cues. SLP Short Term Goal 2 (Week 1): Pt will demonstrate follow 1 step commands in 80% of opportunies with Max A verbal cues. SLP Short Term Goal 3 (Week 1): Pt will complete functional problem solving for basic and familar tasks with Max A multimodal cues. SLP Short Term Goal 4 (Week 1): Pt will utilize external memory aids to recall new, daily information with Max A verbal and question cues. SLP Short Term Goal 5 (Week 1): Pt will identify 2 congitive and 2 phsyical deficits with Mod A multimodal cues.  SLP Short Term Goal 6 (Week 1): Pt will consume trials of dys 3 textured foods with effective mastication and oral clearance with min overt s/s aspriation and Min A verbal cues to follow swallow strategies.  Skilled Therapeutic Interventions:  Pt was seen for skilled ST targeting cognitive goals.  Pt could take and record his blood pressure as he would prior to admission with min assist-supervision verbal cues.  Therapist recommended that pt have at least initial supervision for taking his blood pressure upon discharge.  [name] was present to receive recommendations.  Therapist also facilitated the session with a basic, familiar board game.  Pt needed min-mod assist verbal cues for problem solving due to decreased working memory of task rules and procedures.   Pt was handed off to PT seated in wheelchair.  Continue per current plan of care.    Function:  Eating Eating                 Cognition Comprehension Comprehension assist level: Follows basic conversation/direction with extra time/assistive device   Expression   Expression assist level: Expresses basic 90% of the time/requires cueing < 10% of the time.  Social Interaction Social Interaction assist level: Interacts appropriately 90% of the time - Needs monitoring or encouragement for participation or interaction.  Problem Solving Problem solving assist level: Solves basic 75 - 89% of the time/requires cueing 10 - 24% of the time  Memory Memory assist level: Recognizes or recalls 75 - 89% of the time/requires cueing 10 - 24% of the time    Pain Pain Assessment Pain Assessment: No/denies pain  Therapy/Group: Individual Therapy  Barrett Goldie, Selinda Orion 12/06/2017, 9:01 PM

## 2017-12-06 NOTE — Progress Notes (Signed)
Occupational Therapy Weekly Progress Note  Patient Details  Name: [name] MRN: [id number] Date of Birth: November 25, 1927  Beginning of progress report period: November 29, 2017 End of progress report period: December 06, 2017  Today's Date: 12/06/2017 OT Individual Time: 7510-2585 OT Individual Time Calculation (min): 61 min    Patient has met 3 of 4 short term goals.  [name] continues to make steady progress with OT with regards to ADL performance and functional transfers.  He still needs mod to max assist for orientation and donning of a pullover shirt, with only min assist needed for all bathing sit to stand and for LB dressing.  Transfers are at a min guard assist with use of the RW for support and mod instructional cueing for hand placement.  During sit to stand and stand to sit transitions he tends to use the RW for support instead of using the surface to push off of.  Once standing he exhibits postural changes with flexed trunk and head.  Mod demonstrational cueing is needed to maintain upright posture during mobility and to not look down at the floor.  [name] continues to demonstrate cognitive deficits with regards to understanding and following directions at times and for simple problem solving tasks such as donning as shirt.  They have impoved significantly over the past week compared to eval however.  Still feel he will continue to benefit from CIR level OT with anticipation of discharge on 12/18 or possible sooner, but will need 24 hour supervision for safety.  Family states it will be hard but they can arrange for 24 hour.    Patient continues to demonstrate the following deficits: muscle weakness, decreased problem solving and decreased memory and decreased standing balance, decreased postural control and decreased balance strategies and therefore will continue to benefit from skilled OT intervention to enhance overall performance with BADL and Reduce care partner  burden.  Patient progressing toward long term goals..  Continue plan of care.  OT Short Term Goals Week 2:  OT Short Term Goal 1 (Week 2): Continue working toward established supervision level goals for discharge.  Skilled Therapeutic Interventions/Progress Updates:    Pt completed transfer from bed to chair with min guard assist stand pivot.  Even though he was shown exactly where the chair was and where to sit, he still tried to step around it instead of sitting in it.  Once in the wheelchair, therapist took him to the sink for bathing and dressing tasks.  He completed all bathing sit to stand with min instructional cueing for sequencing.  He needed max assist for orientation and sequencing of donning pullover shirt, but was able to donn most LB clothing with increased time and min assist for socks only.  Noted pt still with some cognitive impairments with regards to transfer from bed to chair and not being able to donn pullover shirt.  When discussed with pt's daughter who was present in the room, she continues to overlook these deficits as just him being in a different environment or that he was too crowded and got nervous when doing the transfer.  Feel pt is making steady progress but still needs min guard to supervision.  Finished session with ambulation down to the therapy gym with min guard assist using the RW.  Mod instructional cueing to maintain upright posture.  Once in the gym therapist provided manual therapy stretching to anterior shoulders and chest in sitting position to help promote trunk extension and anterior pelvic  tilt.  Ambulated back to room at end of session with call button and phone in reach and daughter present.   Will continue to follow.    Therapy Documentation Precautions:  Precautions Precautions: Fall Restrictions Weight Bearing Restrictions: No  Pain: Pain Assessment Pain Assessment: No/denies pain ADL: See Function Navigator for Current Functional  Status.   Therapy/Group: Individual Therapy  Dalary Hollar OTR/L 12/06/2017, 12:17 PM

## 2017-12-07 ENCOUNTER — Inpatient Hospital Stay (HOSPITAL_COMMUNITY): Payer: Medicare Other | Admitting: Speech Pathology

## 2017-12-07 ENCOUNTER — Inpatient Hospital Stay (HOSPITAL_COMMUNITY): Payer: Medicare Other

## 2017-12-07 ENCOUNTER — Inpatient Hospital Stay (HOSPITAL_COMMUNITY): Payer: Medicare Other | Admitting: Occupational Therapy

## 2017-12-07 LAB — BASIC METABOLIC PANEL
Anion gap: 7 (ref 5–15)
BUN: 47 mg/dL — AB (ref 6–20)
CALCIUM: 8.5 mg/dL — AB (ref 8.9–10.3)
CHLORIDE: 103 mmol/L (ref 101–111)
CO2: 26 mmol/L (ref 22–32)
CREATININE: 2.49 mg/dL — AB (ref 0.61–1.24)
GFR, EST AFRICAN AMERICAN: 25 mL/min — AB (ref >60)
GFR, EST NON AFRICAN AMERICAN: 22 mL/min — AB (ref >60)
GLUCOSE: 98 mg/dL (ref 65–99)
Potassium: 4.7 mmol/L (ref 3.5–5.1)
Sodium: 136 mmol/L (ref 135–145)

## 2017-12-07 MED ORDER — TAMSULOSIN HCL 0.4 MG PO CAPS: 0.4000 mg | ORAL_CAPSULE | Freq: Every day | ORAL | 0 refills | Status: DC

## 2017-12-07 MED ORDER — HYDRALAZINE HCL 25 MG PO TABS: 25.0000 mg | ORAL_TABLET | Freq: Three times a day (TID) | ORAL | 0 refills | Status: AC

## 2017-12-07 NOTE — Progress Notes (Signed)
Speech Language Pathology Discharge Summary  Patient Details  Name: [name] MRN: [id number] Date of Birth: 09/24/1927  Today's Date: 12/07/2017 SLP Individual Time: 0800-0850 SLP Individual Time Calculation (min): 50 min   Skilled Therapeutic Interventions:  Pt was seen for skilled ST targeting family education regarding pt's current cognitive goals and progress in therapies.  [name] was present and reports that she feels pt is near his baseline for cognition.  She does report some subtle areas for "improvement;" however, she and therapist are both hopeful that these will resolve once pt is returned to his home environment.  Therapist discussed team's recommendations for 24/7 supervision at discharge due to cognitive and mobility impairments and that pt may need 24/7 supervision long term.  [name] was in agreement with recommendations and reports that she has multiple family members, friends, and hired caregivers who can assist in providing recommended level of assist.  [name] was in agreement with no further ST follow up post discharge; however, therapist did make daughter aware that should she have concerns in the future regarding pt's cognition that outpatient ST is available a resource.  Therapist also discussed activities for cognitive remediation/compensation to maximize treatment effects in the absence of ST follow up on discharge per daughter's request.  All questions were answered to pt's and daughter's satisfaction at this time.  Pt is discharging this afternoon after therapies.      Patient has met 5 of 7 long term goals.  Patient to discharge at overall Min;Supervision level.  Reasons goals not met: min assist needed for recall of basic, daily information and min assist needed for intellectual awareness of his deficits    Clinical Impression/Discharge Summary:  Pt has made excellent functional gains while inpatient and is discharging having met 5 out of 7  short term goals.  Pt's diet has been advanced back to regular textures and thin liquids per improved mentation.  Pt is using swallowing precautions with mod I.  Pt is currently min assist-supervision for basic, familiar cognitive tasks when increased time is allowed for initiation and slowed processing.  Pt and family education is complete at this time and both family and therapist are in agreement that pt is near his baseline for cognition.  As a result, no further ST needs are indicated post discharge.    Care Partner:  Caregiver Able to Provide Assistance: Yes  Type of Caregiver Assistance: Physical;Cognitive  Recommendation:  24 hour supervision/assistance      Equipment: none recommended by SLP    Reasons for discharge: Discharged from hospital   Patient/Family Agrees with Progress Made and Goals Achieved: Yes   Function:  Eating Eating                 Cognition Comprehension Comprehension assist level: Follows basic conversation/direction with extra time/assistive device  Expression   Expression assist level: Expresses basic needs/ideas: With extra time/assistive device  Social Interaction Social Interaction assist level: Interacts appropriately 90% of the time - Needs monitoring or encouragement for participation or interaction.  Problem Solving Problem solving assist level: Solves basic 75 - 89% of the time/requires cueing 10 - 24% of the time  Memory Memory assist level: Recognizes or recalls 50 - 74% of the time/requires cueing 25 - 49% of the time   Windell Moulding L 12/07/2017, 11:04 AM

## 2017-12-07 NOTE — Progress Notes (Signed)
Social Work Patient ID: [name], male   DOB: 12/28/1926, 83 y.o.   MRN: [id number]  Met with pt and daughter who have decided they are ready to go home after therapies today. Discussed discharge needs-rolling walker and OP therpaies. Pt was going to [facility] will resume this. MD and therapy team on board and agreeable to the plan. Daughter feels they can get situated at home before appointment tomorrow.  

## 2017-12-07 NOTE — Progress Notes (Signed)
Occupational Therapy Discharge Summary  Patient Details  Name: [name] MRN: [id number] Date of Birth: 12/24/1927  Today's Date: 12/07/2017 OT Individual Time: 0902-1001 OT Individual Time Calculation (min): 59 min    Session Note:  Pt ambulated to the dayroom with supervision and one rest break at the 4W elevators.  He was then able to use the UE ergonometer for 3 sets of 3 mins each.  First and third sets were completed with resistance on level 5 for 3 mins, with RPMs maintained at 20-23.  Second set was more difficult as resistance was increased to level 10 and he was only able to complete 1.5 mins.  Resistance was reduced to level 8 for another 1.5 mins as he could not tolerate this level either.  All sets were completed peddling backwards to help promote upright posture and posterior back strengthening.  Had pt ambulate to the therapy gym next where he engaged in AROM exercises for scapular retraction as well as rowing.  He was able to complete 1 set of 10 reps using the light resistance orange theraband for shoulder extension, with mod demonstrational cueing for technique.  Educated pt and daughter on the importance of daily AROM for shoulder horizontal abduction as well as positioning to help decrease posterior pelvic tilt and thoracic kyphosis.  Ambulated back to the room with use of the RW to finish session, with pt placed in wheelchair and daughter sitting with him.    Patient has met 10 of 11 long term goals due to improved activity tolerance, improved balance, postural control, improved attention and improved awareness.  Patient to discharge at overall Supervision level.  Patient's care partner is independent to provide the necessary physical and cognitive assistance at discharge.    Reasons goals not met: Pt still needs min to mod assist for UB bathing tasks.   Recommendation:  Patient will benefit from ongoing skilled OT services in outpatient setting to continue to advance  functional skills in the area of BADL and Reduce care partner burden.  Pt still needs supervision for most selfcare tasks and functional transfers using the RW for support.  Feel he is not back to his functional baseline for ADLs at this time and would benefit from continued OT to hopefully reach modified independent level.    Equipment: No equipment provided  Reasons for discharge: treatment goals met and discharge from hospital  Patient/family agrees with progress made and goals achieved: Yes  OT Discharge Precautions/Restrictions  Precautions Precautions: Fall Restrictions Weight Bearing Restrictions: No  Pain Pain Assessment Pain Assessment: No/denies pain ADL  See Function Section of chart for details  Vision Baseline Vision/History: No visual deficits Patient Visual Report: No change from baseline Perception  Perception: Within Functional Limits Praxis Praxis: Intact Cognition Overall Cognitive Status: History of cognitive impairments - at baseline Arousal/Alertness: Awake/alert Orientation Level: Oriented X4 Attention: Sustained Focused Attention: Appears intact Sustained Attention: Impaired Sustained Attention Impairment: Functional basic Memory: Impaired Memory Impairment: Storage deficit;Decreased recall of new information Awareness: Impaired Awareness Impairment: Anticipatory impairment Problem Solving: Impaired Problem Solving Impairment: Functional basic Executive Function: Initiating Initiating: Impaired Initiating Impairment: Functional basic;Verbal basic Safety/Judgment: Appears intact Comments: Pt continues to demonstrate mild memory impairments related to hand positioning during stand to sit transitions.  He also still demonstrates decreased ability to orient and donn pullover shirt during dressing tasks.  Feel he is getting closer to baseline however. Sensation Sensation Light Touch: Appears Intact Stereognosis: Not tested Hot/Cold: Not  tested Proprioception: Not tested Additional  Comments: B LE sensation appears intact Coordination Gross Motor Movements are Fluid and Coordinated: No Fine Motor Movements are Fluid and Coordinated: No Coordination and Movement Description: Slower movements in BUEs but uses them functionally at an independent level.  Limited by trunk posture and postioning. Heel Shin Test: slow and uncoordinated Motor  Motor Motor: Abnormal postural alignment and control Motor - Discharge Observations: Generalized weakness Mobility  Transfers Transfers: Sit to Stand;Stand to Sit Sit to Stand: 5: Supervision;With armrests;With upper extremity assist;From chair/3-in-1 Stand to Sit: 5: Supervision;With upper extremity assist;To chair/3-in-1;With armrests;To toilet  Trunk/Postural Assessment  Cervical Assessment Cervical Assessment: Exceptions to [facility] Thoracic Assessment Thoracic Assessment: Exceptions to [facility] Lumbar Assessment Lumbar Assessment: Exceptions to [facility] Postural Control Postural Control: Within Functional Limits  Balance Balance Balance Assessed: Yes Standardized Balance Assessment Standardized Balance Assessment: Berg Balance Test Berg Balance Test Sit to Stand: Able to stand  independently using hands Standing Unsupported: Able to stand safely 2 minutes Sitting with Back Unsupported but Feet Supported on Floor or Stool: Able to sit safely and securely 2 minutes Stand to Sit: Controls descent by using hands Transfers: Able to transfer safely, definite need of hands Standing Unsupported with Eyes Closed: Able to stand 10 seconds with supervision Standing Ubsupported with Feet Together: Able to place feet together independently and stand for 1 minute with supervision From Standing, Reach Forward with Outstretched Arm: Reaches forward but needs supervision From Standing Position, Pick up Object from Floor: Able to pick up shoe, needs supervision From Standing Position, Turn to Look Behind  Over each Shoulder: Turn sideways only but maintains balance Turn 360 Degrees: Able to turn 360 degrees safely but slowly Standing Unsupported, Alternately Place Feet on Step/Stool: Able to stand independently and complete 8 steps >20 seconds Standing Unsupported, One Foot in Front: Able to plae foot ahead of the other independently and hold 30 seconds Standing on One Leg: Tries to lift leg/unable to hold 3 seconds but remains standing independently Total Score: 38 Static Sitting Balance Static Sitting - Balance Support: No upper extremity supported Static Sitting - Level of Assistance: 5: Stand by assistance Dynamic Sitting Balance Dynamic Sitting - Balance Support: During functional activity Dynamic Sitting - Level of Assistance: 5: Stand by assistance Static Standing Balance Static Standing - Balance Support: During functional activity Static Standing - Level of Assistance: 5: Stand by assistance Dynamic Standing Balance Dynamic Standing - Balance Support: During functional activity Dynamic Standing - Level of Assistance: 4: Min assist Extremity/Trunk Assessment RUE Assessment RUE Assessment: Exceptions to WFL(Pt still demonstrates limited AROM bilateral shoulder flexion to approximately 90 degrees secondary to thoracic posturing.  All other joints and AROM WFLS with strength at least 3+/5 throughout.  ) LUE Assessment LUE Assessment: Exceptions to WFL(Pt still demonstrates limited AROM bilateral shoulder flexion to approximately 90 degrees secondary to thoracic posturing.  All other joints and AROM WFLS with strength at least 3+/5 throughout. )   See Function Navigator for Current Functional Status.  Chao Blazejewski OTR/L 12/07/2017, 12:51 PM

## 2017-12-07 NOTE — Plan of Care (Signed)
Progressing with mod I assist

## 2017-12-07 NOTE — Progress Notes (Signed)
Social Work  Discharge Note  The overall goal for the admission was met for:   Discharge location: Yes-HOME WITH 24 HR SUPERVISION  Length of Stay: Yes-9 DAYS  Discharge activity level: Yes-SUPERVISION LEVEL  Home/community participation: Yes  Services provided included: MD, RD, PT, OT, SLP, RN, CM, TR, Pharmacy and SW  Financial Services: Medicare and Private Insurance: [place]  Follow-up services arranged: Outpatient: [facility]-PT & OT-TO CONTACT DAUGHTER TO SCHEDULE APPOINTMENT, DME: [facility] and Patient/Family has no preference for HH/DME agencies. Only PT offered at [facility] still want to go there due to has been seeing PT there and has a established relationship with her.  Comments (or additional information):DAUGHTER AND SON IN-LAW HAVE STAYED HERE AND BEEN PRESENT FOR ALL OF HIS THERAPIES. AWARE OF THE NEED FOR 24 HR SUPERVISION AT HOME AND HAVE ARRANGED THIS. READY TO GO HOME TODAY  Patient/Family verbalized understanding of follow-up arrangements: Yes  Individual responsible for coordination of the follow-up plan: JULIE-DAUGHTER  Confirmed correct DME delivered: Elease Hashimoto 12/07/2017    Elease Hashimoto

## 2017-12-07 NOTE — Progress Notes (Signed)
Physical Therapy Discharge Summary  Patient Details  Name: [name] MRN: [id number] Date of Birth: 1927-06-11  Today's Date: 12/07/2017 PT Individual Time: 1130-1200, 1300-1400 PT Individual Time Calculation (min): 30 min , 60 min    Patient has met 7 of 7 long term goals due to improved activity tolerance, improved balance, improved postural control, increased strength and improved attention.  Patient to discharge at an ambulatory level Supervision.   Patient's care partner is independent to provide the necessary supervision assistance at discharge.  All goals met.   Recommendation:  Patient will benefit from ongoing skilled PT services in outpatient setting to continue to advance safe functional mobility, address ongoing impairments in balance, posture, muscle length, strength, gait, and minimize fall risk.  Equipment: RW  Reasons for discharge: treatment goals met  Patient/family agrees with progress made and goals achieved: Yes   PT Treatment Interventions Session 1: Pt seated in w/c upon PT arrival, agreeable to therapy tx and denies pain. Pt ambulated from room>gym without AD and CGA. Berg balance test completed this session with pt scoring 38/56, therapist discussed these results with pt and his daughter, emphasizing the use of a RW full time at home. Pt ambulated back to room with RW and with supervision, verbal cues for upright posture. Pt ambulated within room using RW in order to use the bathroom with supervision. Pt left seated in w/c with needs in reach and daughter present.   Session 2: Pt seated in w/c upon PT arrival, agreeable to therapy tx and denies pain. [name] present throughout session, all questions answered. Pt ambulated from room>gym with RW and supervision. Pt ambulated within the unit x 150 ft, x 165 ft and x 175 ft with RW and supervision. Pt used the nustep x 7 minutes with emphasis on LE movement through full range, also for global strengthening,  workload 5. Pt performed all transfers including car transfer with supervision. Pt provided with handouts for HEP including OTAGO exercises. Pt worked on dynamic balance standing on foam while participating in ball toss. Pt left seated in room at end of session with needs in reach.   PT Discharge Precautions/Restrictions Precautions Precautions: Fall Restrictions Weight Bearing Restrictions: No Pain Pain Assessment Pain Assessment: No/denies pain Cognition Overall Cognitive Status: History of cognitive impairments - at baseline Arousal/Alertness: Awake/alert Orientation Level: Oriented X4 Attention: Sustained Sustained Attention: Impaired Sustained Attention Impairment: Functional basic Memory: Impaired Memory Impairment: Decreased recall of new information;Retrieval deficit Awareness: Impaired Awareness Impairment: Intellectual impairment Problem Solving: Impaired Problem Solving Impairment: Functional basic Executive Function: Initiating Initiating: Impaired Initiating Impairment: Functional basic;Verbal basic Safety/Judgment: Appears intact Comments: continues to have processing delay and delayed task initiation but improved from initial evaluation Sensation Sensation Light Touch: Appears Intact Proprioception: Appears Intact Additional Comments: B LE sensation appears intact Coordination Gross Motor Movements are Fluid and Coordinated: No Fine Motor Movements are Fluid and Coordinated: No Coordination and Movement Description: slow and uncoordinated LE movement, shuffling with gait and bradykinesia Heel Shin Test: slow and uncoordinated Motor  Motor Motor - Discharge Observations: generalized weakness, bradykinesia  Trunk/Postural Assessment  Cervical Assessment Cervical Assessment: Exceptions to WFL(cervical protraction) Thoracic Assessment Thoracic Assessment: Exceptions to WFL(kyphotic) Lumbar Assessment Lumbar Assessment: Exceptions to WFL(increased lumbar  flexion) Postural Control Postural Control: Within Functional Limits  Balance Balance Balance Assessed: Yes Standardized Balance Assessment Standardized Balance Assessment: Berg Balance Test Berg Balance Test Sit to Stand: Able to stand  independently using hands Standing Unsupported: Able to stand safely 2 minutes  Sitting with Back Unsupported but Feet Supported on Floor or Stool: Able to sit safely and securely 2 minutes Stand to Sit: Controls descent by using hands Transfers: Able to transfer safely, definite need of hands Standing Unsupported with Eyes Closed: Able to stand 10 seconds with supervision Standing Ubsupported with Feet Together: Able to place feet together independently and stand for 1 minute with supervision From Standing, Reach Forward with Outstretched Arm: Reaches forward but needs supervision From Standing Position, Pick up Object from Floor: Able to pick up shoe, needs supervision From Standing Position, Turn to Look Behind Over each Shoulder: Turn sideways only but maintains balance Turn 360 Degrees: Able to turn 360 degrees safely but slowly Standing Unsupported, Alternately Place Feet on Step/Stool: Able to stand independently and complete 8 steps >20 seconds Standing Unsupported, One Foot in Front: Able to plae foot ahead of the other independently and hold 30 seconds Standing on One Leg: Tries to lift leg/unable to hold 3 seconds but remains standing independently Total Score: 38 Static Sitting Balance Static Sitting - Level of Assistance: 5: Stand by assistance Dynamic Sitting Balance Dynamic Sitting - Level of Assistance: 5: Stand by assistance Static Standing Balance Static Standing - Level of Assistance: 5: Stand by assistance Dynamic Standing Balance Dynamic Standing - Level of Assistance: 5: Stand by assistance Extremity Assessment  RLE Assessment RLE Assessment: Exceptions to WFL(strength grossly 4/5 throughout, limited hip extension, tight  hamstrings and DF to neutral) LLE Assessment LLE Assessment: Exceptions to WFL(strength grossly 4/5 throughout, limited hip extension, tight hamstrings and DF to neutral)   See Function Navigator for Current Functional Status.  Netta Corrigan, PT, DPT 12/07/2017, 12:16 PM

## 2017-12-07 NOTE — Discharge Instructions (Signed)
Inpatient Rehab Discharge Instructions  [name] Discharge date and time: No discharge date for patient encounter.   Activities/Precautions/ Functional Status: Activity: activity as tolerated Diet: 1200 mL fluid restriction Wound Care: none needed Functional status:  ___ No restrictions     ___ Walk up steps independently ___ 24/7 supervision/assistance   ___ Walk up steps with assistance ___ Intermittent supervision/assistance  ___ Bathe/dress independently ___ Walk with walker     _x__ Bathe/dress with assistance ___ Walk Independently    ___ Shower independently ___ Walk with assistance    ___ Shower with assistance ___ No alcohol     ___ Return to work/school ________  Special Instructions: Follow-up Dr.Harish [address], [place], [place] [phone] on 12/13/2017 to discuss anticoagulation for new onset atrial fibrillation   COMMUNITY REFERRALS UPON DISCHARGE:    Outpatient: PT & OT  Agency:[facility]  Phone:[phone]  Date of Last Service:12/07/2017  Appointment Date/Time:WILL CONTACT DAUGHTER TO [facility] [place]  [phone]     My questions have been answered and I understand these instructions. I will adhere to these goals and the provided educational materials after my discharge from the hospital.  Patient/Caregiver Signature _______________________________ Date __________  Clinician Signature _______________________________________ Date __________  Please bring this form and your medication list with you to all your follow-up doctor's appointments.

## 2017-12-07 NOTE — Progress Notes (Signed)
Pt discharged to home with daughter and all belongings. Social work signed off on discharge and discharge instructions given by PA.

## 2017-12-07 NOTE — Discharge Summary (Signed)
Discharge summary job [phone]

## 2017-12-07 NOTE — Progress Notes (Signed)
PHYSICAL MEDICINE & REHABILITATION     PROGRESS NOTE  Subjective/Complaints:  Patient seen sitting up in his chair this morning. Daughter at bedside who states patient slept well overnight. Patient indicates he is doing well. Daughter states that she would like patient to be discharged after physical therapy tomorrow morning so that she may be able to make his outpatient appointment.  ROS: Denies CP, SOB, nausea, vomiting, diarrhea..  Objective: Vital Signs: Blood pressure (!) 141/76, pulse 70, temperature 97.9 F (36.6 C), temperature source Oral, resp. rate 16, height 5\' 8"  (1.727 m), weight 67.7 kg (149 lb 4 oz), SpO2 97 %. No results found. Recent Labs    12/05/17 1036  WBC 3.3*  HGB 9.1*  HCT 28.9*  PLT 133*   Recent Labs    12/05/17 1036 12/07/17 0530  NA 139 136  K 4.6 4.7  CL 106 103  GLUCOSE 102* 98  BUN 50* 47*  CREATININE 2.52* 2.49*  CALCIUM 8.6* 8.5*   CBG (last 3)  No results for input(s): GLUCAP in the last 72 hours.  Wt Readings from Last 3 Encounters:  12/07/17 67.7 kg (149 lb 4 oz)  11/28/17 70.8 kg (156 lb 1.4 oz)  11/02/17 72.7 kg (160 lb 4 oz)    Physical Exam:  BP (!) 141/76   Pulse 70   Temp 97.9 F (36.6 C) (Oral)   Resp 16   Ht 5\' 8"  (1.727 m)   Wt 67.7 kg (149 lb 4 oz)   SpO2 97%   BMI 22.69 kg/m  Constitutional: He appears well-developed. Frail  HENT: Normocephalic and atraumatic.  Eyes: EOM are normal. No discharge.  Cardiovascular: Irregularly irregular. No JVD Respiratory: Limited inspiratory effort.  Clear. GI: Bowel sounds are normal. He exhibits no distension/nontender  Musculoskeletal: He exhibits edema (LE). He exhibits no tenderness.  Neurological: Not oriented to date. Motor: B/l UE 4/5 proximal to distal  B/l LE: Hip flexion 2-/5, knee extension 3/5, ADF/PF 4-/5 (stable) HOH Dysarthria. Skin. Warm and dry. Intact.  Psych: Mood and behavior appear to be normal. Dementia  Assessment/Plan: 1. Functional  deficits secondary to debility which require 3+ hours per day of interdisciplinary therapy in a comprehensive inpatient rehab setting. Physiatrist is providing close team supervision and 24 hour management of active medical problems listed below. Physiatrist and rehab team continue to assess barriers to discharge/monitor patient progress toward functional and medical goals.  Function:  Bathing Bathing position   Position: Wheelchair/chair at sink  Bathing parts Body parts bathed by patient: Right arm, Left arm, Abdomen, Front perineal area, Buttocks, Right upper leg, Left upper leg, Left lower leg, Right lower leg Body parts bathed by helper: Back  Bathing assist Assist Level: Touching or steadying assistance(Pt > 75%)      Upper Body Dressing/Undressing Upper body dressing   What is the patient wearing?: Pull over shirt/dress     Pull over shirt/dress - Perfomed by patient: Put head through opening Pull over shirt/dress - Perfomed by helper: Thread/unthread right sleeve, Thread/unthread left sleeve, Put head through opening, Pull shirt over trunk        Upper body assist Assist Level: (MaxA)      Lower Body Dressing/Undressing Lower body dressing   What is the patient wearing?: Pants, Socks, Shoes   Underwear - Performed by helper: Thread/unthread right underwear leg, Thread/unthread left underwear leg, Pull underwear up/down Pants- Performed by patient: Thread/unthread right pants leg, Thread/unthread left pants leg, Pull pants up/down Pants- Performed by  helper: Thread/unthread right pants leg, Thread/unthread left pants leg, Pull pants up/down   Non-skid slipper socks- Performed by helper: Don/doff right sock, Don/doff left sock Socks - Performed by patient: Don/doff right sock, Don/doff left sock   Shoes - Performed by patient: Don/doff right shoe, Don/doff left shoe            Lower body assist Assist for lower body dressing: Touching or steadying assistance (Pt >  75%)      Toileting Toileting     Toileting steps completed by helper: Adjust clothing prior to toileting, Performs perineal hygiene, Adjust clothing after toileting Toileting Assistive Devices: Grab bar or rail, Toilet aid  Toileting assist Assist level: Touching or steadying assistance (Pt.75%), More than reasonable time   Transfers Chair/bed transfer Chair/bed transfer activity did not occur: N/A Chair/bed transfer method: Stand pivot, Ambulatory Chair/bed transfer assist level: Supervision or verbal cues Chair/bed transfer assistive device: Walker, Air cabin crew Ambulation activity did not occur: Refused   Max distance: 150 ft Assist level: Supervision or verbal cues   Wheelchair   Type: Manual Max wheelchair distance: 50 ft Assist Level: Supervision or verbal cues  Cognition Comprehension Comprehension assist level: Follows basic conversation/direction with extra time/assistive device  Expression Expression assist level: Expresses basic 90% of the time/requires cueing < 10% of the time.  Social Interaction Social Interaction assist level: Interacts appropriately 90% of the time - Needs monitoring or encouragement for participation or interaction.  Problem Solving Problem solving assist level: Solves basic 75 - 89% of the time/requires cueing 10 - 24% of the time  Memory Memory assist level: Recognizes or recalls 75 - 89% of the time/requires cueing 10 - 24% of the time    Medical Problem List and Plan:  1. Debility secondary to acute respiratory failure with hypoxia    Continue CIR, patient's daughter would like to DC tomorrow. Will discuss with team 2. DVT Prophylaxis/Anticoagulation: SCDs. Monitor for any signs of DVT  3. Pain Management: Oxycodone. Monitor mental status  4. Mood/dementia: Aricept 5 mg daily  5. Neuropsych: This patient is capable of making decisions on his own behalf.  6. Skin/Wound Care: Routine skin checks  7.  Fluids/Electrolytes/Nutrition: Routine I&O's    Advanced to regular renal diet 8. New onset atrial fibrillation. Follow-up per cardiology services. Currently not a candidate for Coumadin therapy. Patient is asymptomatic  9. Acute on chronic kidney disease stage IV as well as hyperkalemia. Follow-up per renal services. Patient was using Kayexalate 15 g 2 times a week Mondays and Fridays prior to admission. Follow electrolytes closely. He is followed by [facility] nephrology [place], [place] Dr. Dimas Aguas    Creatinine 2.49 on 12/12    Potassium 4.7 on 12/12    Nephrology notes reviewed, appreciate recs, signed off.       Continue to monitor 10. History of AAA with concern for endoleak. Follow-up interventional radiology no leak identified on workup  11. History of renal cell carcinoma with solitary kidney. Monitor urine output  12. Anemia of chronic disease. Per Nephro no further Fe or Epo needed   Hemoglobin 9.1 on 12/10   Continue to monitor 13. Pancytopenia/thrombocytopenia. Followed hematology in [facility]: Improving    Persistent, but improving overall on 12/10 14. History of Parkinson's disease. Home Sinemet resumed on 12/7  15. HTN   Hydralazine increased on 12/6    Relatively controlled on 12/12 Vitals:   12/07/17 0530 12/07/17 0543  BP: Marland Kitchen)  141/76 (!) 141/76  Pulse: 70   Resp: 16   Temp: 97.9 F (36.6 C)   SpO2: 97%    16. Hypoalbuminemia    Supplement initiated on 12/5 17. PNA   Left lower lobe, chest x-ray reviewed on 12/5   Levaquin started on 12/5- 12/12 18.  Urinary freq mainly at noc    Cont trial flomax, UA C and S neg, low residual ,    Cont flomax, improving   LOS (Days) 8 A FACE TO FACE EVALUATION WAS PERFORMED  Antoneo Ghrist Lorie Phenix 12/07/2017 8:46 AM

## 2017-12-08 ENCOUNTER — Inpatient Hospital Stay (HOSPITAL_COMMUNITY): Payer: Medicare Other | Admitting: Occupational Therapy

## 2017-12-08 ENCOUNTER — Telehealth: Payer: Self-pay

## 2017-12-08 DIAGNOSIS — I120 Hypertensive chronic kidney disease with stage 5 chronic kidney disease or end stage renal disease: Secondary | ICD-10-CM | POA: Diagnosis not present

## 2017-12-08 DIAGNOSIS — J449 Chronic obstructive pulmonary disease, unspecified: Secondary | ICD-10-CM | POA: Diagnosis not present

## 2017-12-08 DIAGNOSIS — G20 Parkinson's disease: Secondary | ICD-10-CM | POA: Diagnosis not present

## 2017-12-08 DIAGNOSIS — Z79899 Other long term (current) drug therapy: Secondary | ICD-10-CM | POA: Diagnosis not present

## 2017-12-08 DIAGNOSIS — I4891 Unspecified atrial fibrillation: Secondary | ICD-10-CM | POA: Diagnosis not present

## 2017-12-08 DIAGNOSIS — Z888 Allergy status to other drugs, medicaments and biological substances status: Secondary | ICD-10-CM | POA: Diagnosis not present

## 2017-12-08 DIAGNOSIS — N185 Chronic kidney disease, stage 5: Secondary | ICD-10-CM | POA: Diagnosis not present

## 2017-12-08 NOTE — Telephone Encounter (Signed)
12/08/17  TCM Hospital Follow Up  Transition Care Management Follow-up Telephone Call  ADMISSION DATE: 11/29/17  DISCHARGE DATE: 12/07/17   How have you been since you were released from the hospital?  Per Daughter patient has been doing well.   Do you understand why you were in the hospital? Yes   Do you understand the discharge instrcutions? Yes    Items Reviewed:  Medications reviewed: Reviewed with daughter On hold are the following meds: Labetalol,Hctz,Kaexolate,Lyrica   Allergies reviewed: Yes   Dietary changes reviewed:Regular diet per daughter   Referrals reviewed: Appointment scheduled for follow up visit with [name] on 12/30/16 at 2:00 pm   Functional Questionnaire:   Activities of Daily Living (ADLs): Patient is able to perform all independently per daughter  Any patient concerns? None per daughter   Confirmed importance and date/time of follow-up visits scheduled: Yes   Confirmed with patient if condition begins to worsen call PCP or go to the ER. Yes    Patient was given the office number and encouragred to call back with questions or concerns.Yes

## 2017-12-08 NOTE — Telephone Encounter (Signed)
Noted  

## 2017-12-08 NOTE — Telephone Encounter (Signed)
Called patient/daughter for TCM hospital follow up. Left message for return call to schedule appointment  Post hospitalization.

## 2017-12-08 NOTE — Discharge Summary (Signed)
[name], [name]                  ACCOUNT NO.:  [id number]  MEDICAL RECORD NO.:  [id number]  LOCATION:                                 FACILITY:  PHYSICIAN:  Delice Lesch, MD        DATE OF BIRTH:  [birth year]  DATE OF ADMISSION:  11/29/2017 DATE OF DISCHARGE:  12/07/2017                              DISCHARGE SUMMARY   DISCHARGE DIAGNOSES: 1. Debility secondary to acute respiratory failure with hypoxia. 2. Sequential compression devices for deep vein thrombosis     prophylaxis. 3. Pain management. 4. Mood with memory loss. 5. New onset atrial fibrillation. 6. Acute on chronic kidney disease. 7. History of abdominal aortic aneurysm with concern for endoleak. 8. History of renal cell carcinoma. 9. Solitary kidney. 10.Anemia of chronic disease. 11.Pancytopenia. 12.Thrombocytopenia. 13.History of Parkinson disease. 14.Hypertension. 15.Decreased nutritional storage. 16.Pneumonia. 17.Urinary frequency.  This is an 83 year old right-handed male, history of CKD stage 5, dementia, Parkinson disease, maintained on Sinemet, AAA with prior endovascular repair and angiogram in 2009, renal cell carcinoma with solitary left kidney, hypertension, COPD, chronic anemia, and pancytopenia.  Per chart review and daughter, lives alone, independent, does not drive, family checks on him as needed.  Presented on November 23, 2017, after seeing his nephrologist for possible AAA, endoleak repair by Interventional Radiology.  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  Postprocedure, mildly hypoxic.  Chest x-ray showed right lower lobe consolidation versus atelectasis, remained on oxygen therapy, Pulmonary Services followup.  He recovered nicely. Renal Service followup for chronic kidney disease, mildly elevated creatinine, renal ultrasound unremarkable of left kidney, latest creatinine 3.02,expected slow recovery.   On November 28, 2017, Cardiology Service consulted for new onset atrial fibrillation, slow ventricular response, felt to be exacerbated by his current medical illness.  No chest pain or shortness of breath.  Echocardiogram on November 28, 2017, showed ejection fraction of 60%.  No wall motion abnormalities.  He was not felt to be a Coumadin candidate due to fall risk and pancytopenia. Plan was to follow up outpatient with Hematology Services who has followed by in regard to possible anticoagulation.  Physical and occupational therapy ongoing.  The patient was admitted for comprehensive rehab program.  PAST MEDICAL HISTORY:  See discharge diagnoses.  SOCIAL HISTORY:  Lives alone.  Reported to be independent.  Family checks on him as needed.  FUNCTIONAL STATUS UPON ADMISSION TO REHAB SERVICES:  Moderate assist 40 feet rolling walker, minimal assist supine to sit, min guard upper body, and moderate assist lower body, activities of daily living.  PHYSICAL EXAMINATION:  VITAL SIGNS:  Blood pressure 166/65, pulse 65, temperature 98, and respirations 18. GENERAL:  Alert male.  Flat affect. HEENT:  EOMs intact. NECK:  Supple, nontender.  No JVD. CARDIAC:  Irregularly irregular, without murmur. ABDOMEN:  Soft, nontender.  Good bowel sounds. LUNGS:  Decreased breath sounds, but clear to auscultation. NEUROLOGICAL:  He had some difficulty following basic commands, easily distracted at times.  Family in the room continuously assisting him with answers.  He was oriented to person and place.  Strength of upper extremities 3/5  in deltoid, biceps, triceps; 3+ out of 5 at the wrist and hands; 3/5 ankle dorsi/plantarflexion, hip flexors.  REHABILITATION HOSPITAL COURSE:  The patient was admitted to Inpatient Rehab Services.  Therapies initiated on a 3-hour daily basis, consisting of physical therapy, occupational therapy, speech therapy, and rehabilitation nursing.  The following issues were  addressed during the patient's rehabilitation stay.  Pertaining to Mr. Fly debility secondary to acute respiratory failure, hypoxia, multi-medical, remained stable, followed by Pulmonary Services.  He was completing a course of Levaquin for chest x-ray atelectasis versus pneumonia.  He remained afebrile.  Oxygen saturations 90%.  No increasing shortness of breath. He remained on Aricept for history of memory loss.  Dementia, followed by Speech Therapy.  New onset atrial fibrillation, Cardiology Services followup, not a candidate for Coumadin therapy due to pancytopenia and thrombocytopenia.  Plan was to follow up with Dr. Cruzita Lederer who followed the patient from Hematology Services, [phone].  Arrangements made for followup on December 13, 2017, to discuss possible plan for anticoagulation.  Cardiac rate remained controlled.  Acute on chronic kidney disease, again followed by Dr. Dimas Aguas of [facility], latest creatinine 2.52, potassium 4.6.  Nephrology Services while in the hospital since signed off.  Noted history of AAA, concerned for endoleak, Interventional Radiology followup, angiogram showed no signs of leak.  Anemia of chronic disease, again he would follow up outpatient Hematology Services.  History of Parkinson disease, maintained on Sinemet.  Plan was for followup with movement disorder specialists for possible adjustments in his Parkinson's medications.  Blood pressures remained well controlled.  He did have some urinary frequency, urine study negative.  He was on low-dose Flomax.  Postvoid residuals were good.  The patient received weekly collaborative interdisciplinary team conferences to discuss estimated length of stay, family teaching, any barriers to discharge.  He was ambulating to his room to the therapy gym 150 feet, rolling walker, minimal verbal cues.  Worked on navigating 3- inch platform stairs, rolling walker without assistive device, minimal assist.  Worked on  dynamic standing balance without upper extremity support as well as energy conservation techniques.  Activities of daily living and homemaking.  Still needed moderate to max assist for orientation and donning and pulling on of a shirt.  Minimal assist needed for all other bathing, sit to stand, lower body dressing.  Full family teaching was completed.  Initial discharge scheduled for December 13, 2017; was moved up to December 08, 2017, at family's request, so he could meet outpatient medical appointments.  DISCHARGE MEDICATIONS:  At time of dictation included: 1. Sinemet 25-100 mg 1/2 tablet p.o. b.i.d. 2. Colace 100 mg p.o. daily. 3. Aricept 5 mg p.o. at bedtime. 4. Hydralazine 25 mg p.o. every 8 hours. 5. Levaquin 500 mg p.o. every 48 hours, completed December 07, 2017. 6. Continue eye drops 1 drop both eyes twice daily. 7. Flomax 0.4 mg p.o. every evening.  DIET:  His diet was a 1200-mL fluid restriction.  FOLLOWUP:  The patient will follow up with Dr. Delice Lesch at the Outpatient Rehab Service office as directed; Dr. Cruzita Lederer, Hematology Service in [place], [place] on December 13, 2017, to discuss anticoagulation for new onset of atrial fibrillation; Dr. Dimas Aguas of Renal Services, follow up as directed; Dr. Candee Furbish, Cardiology Services for evaluation of new onset atrial fibrillation. Discussed plan for Hematology Services after followup with Hematology Services; Dr. Kathlene November, Medical Management.  SPECIAL INSTRUCTIONS:  Continue to provide supervision for the patient's safety.  Lauraine Rinne, P.A.   ______________________________ Delice Lesch, MD    DA/MEDQ  D:  12/07/2017  T:  12/07/2017  Job:  [id number]  cc:   Leeanne Rio, MD Dimas Aguas, MD Kathlene November, MD Jerline Pain, MD Delice Lesch, MD

## 2017-12-12 ENCOUNTER — Ambulatory Visit: Payer: Medicare Other | Attending: Physical Medicine and Rehabilitation | Admitting: Physical Therapy

## 2017-12-12 DIAGNOSIS — R293 Abnormal posture: Secondary | ICD-10-CM | POA: Diagnosis not present

## 2017-12-12 DIAGNOSIS — R262 Difficulty in walking, not elsewhere classified: Secondary | ICD-10-CM | POA: Diagnosis not present

## 2017-12-12 DIAGNOSIS — R2681 Unsteadiness on feet: Secondary | ICD-10-CM | POA: Insufficient documentation

## 2017-12-12 DIAGNOSIS — R29898 Other symptoms and signs involving the musculoskeletal system: Secondary | ICD-10-CM | POA: Insufficient documentation

## 2017-12-12 DIAGNOSIS — M6281 Muscle weakness (generalized): Secondary | ICD-10-CM | POA: Insufficient documentation

## 2017-12-12 DIAGNOSIS — R29818 Other symptoms and signs involving the nervous system: Secondary | ICD-10-CM | POA: Diagnosis not present

## 2017-12-12 DIAGNOSIS — R2689 Other abnormalities of gait and mobility: Secondary | ICD-10-CM | POA: Insufficient documentation

## 2017-12-12 NOTE — Therapy (Signed)
[facility] [address]  [place], [place], [address] Phone: [phone]   Fax:  [phone]  Physical Therapy Evaluation  Patient Details  Name: [name] MRN: [id number] Date of Birth: 1927-03-06 Referring Provider: Reesa Chew, PA-C   Encounter Date: 12/12/2017  PT End of Session - 12/12/17 1543    Visit Number  1    Number of Visits  12    Date for PT Re-Evaluation  01/27/18    Authorization Type  [place] [employer] / BCBS    PT Start Time  1447    PT Stop Time  1532    PT Time Calculation (min)  45 min    Activity Tolerance  Patient tolerated treatment well;Patient limited by fatigue    Behavior During Therapy  [facility] for tasks assessed/performed       Past Medical History:  Diagnosis Date  . Abdominal aortic aneurysm ([place]) 09/2008   s/o endovascular repair and angiogram 10/04/08   . Anemia    EGD Cscope 08-2012  . Arthritis    "hips" (11/23/2017)  . Atrial fibrillation ([place])   . Bleeding stomach ulcer 1990s  . Bleeding ulcer 1997  . CKD (chronic kidney disease), stage IV (HCC)    Stable, Dr. Dimas Aguas  . COPD (chronic obstructive pulmonary disease) ([place])    "mild" (11/23/2017)  . Dementia    "on very small dose of Aricept" (11/23/2017)  . H/O hyperkalemia    Stable  . Hypertension   . Leukopenia 2/11   thought to be from doxy  . Pneumonia 2018 X 2  . Renal cell carcinoma    clear cell type dx 10/09, s/p excision 10-24-08  . Renal insufficiency   . Solitary left kidney    W/ chronic tubule-interstitial damage, Dr. Dimas Aguas  . Spinal stenosis    causing LE weakness-persistant, being worked up by Hydrographic surveyor and neurologist    Past Surgical History:  Procedure Laterality Date  . ABDOMINAL AORTIC ANEURYSM REPAIR  2009   EVAR  . CATARACT EXTRACTION W/ INTRAOCULAR LENS  IMPLANT, BILATERAL Bilateral   . HEMORRHOID SURGERY  1990  . IR ANGIOGRAM PELVIS SELECTIVE OR SUPRASELECTIVE  11/24/2017   . IR ANGIOGRAM SELECTIVE EACH ADDITIONAL VESSEL  11/24/2017  . IR ANGIOGRAM SELECTIVE EACH ADDITIONAL VESSEL  11/24/2017  . IR ANGIOGRAM SELECTIVE EACH ADDITIONAL VESSEL  11/24/2017  . IR ANGIOGRAM SELECTIVE EACH ADDITIONAL VESSEL  11/24/2017  . IR ANGIOGRAM VISCERAL SELECTIVE  11/24/2017  . IR ANGIOGRAM VISCERAL SELECTIVE  11/24/2017  . IR RADIOLOGIST EVAL & MGMT  09/07/2017  . IR US GUIDE VASC ACCESS RIGHT  11/24/2017  . [place] ([place]), 2004  . NEPHRECTOMY  2009   Open nephrectomy for cancer    There were no vitals filed for this visit.   Subjective Assessment - 12/12/17 1453    Subjective  Recent hospitalization (11/23/17 - 11/30/17)  for IR repair of AAA endoleak complicated by acute resp failure & PNA requiring inpt rehab stay (11/30/17 - 12/07/17). Discharge recommendations from inpt rehab included use of RW and 24 hr supervision. Pt's son-in-law states he has been staying overnight with pt until last night, with pt on his own for first time last night. Pt reports intermittently using RW at home, although less recently and presents to OP PT w/o AD. Pt reports he has been trying to resume HEPs from prior OP PT episodes.     Patient Stated Goals  "  walking better"    Currently in Pain?  Yes    Pain Score  5     Pain Location  Foot    Pain Orientation  Right;Left    Pain Descriptors / Indicators  -- "bubbling"    Pain Type  Chronic pain;Neuropathic pain    Pain Onset  More than a month ago    Pain Frequency  Intermittent    Aggravating Factors   up on feet    Pain Relieving Factors  rest    Effect of Pain on Daily Activities  limited walking tolerance    Multiple Pain Sites  No         [facility] PT Assessment - 12/12/17 1447      Assessment   Medical Diagnosis  Debility, resp failure/PNA    Referring Provider  Reesa Chew, PA-C    Onset Date/Surgical Date  11/23/17    Next MD Visit  12/30/17 with Kathlene November, MD (PCP) & 01/04/18 with Basil Dess, MD (ortho)    Prior  Therapy  Inpt rehab 11/30/17 - 12/07/17; mutiple OP episodes for Parkinson's, balance & coordination deficits, and lumbar stenosisi      Precautions   Precautions  Fall      Balance Screen   Has the patient fallen in the past 6 months  No    Has the patient had a decrease in activity level because of a fear of falling?   No    Is the patient reluctant to leave their home because of a fear of falling?   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      Coordination   Gross Motor Movements are Fluid and Coordinated  No    Fine Motor Movements are Fluid and Coordinated  No    Coordination and Movement Description  slow and uncoordinated LE movement, shuffling with gait and bradykinesia    Heel Shin Test  slow and uncoordinated      Strength   Overall Strength Comments  tested in sitting    Right Hip Flexion  4-/5    Right Hip Extension  3+/5    Right Hip External Rotation   4-/5    Right Hip Internal Rotation  4-/5    Right Hip ABduction  4/5    Right Hip ADduction  4-/5    Left Hip Flexion  4-/5    Left Hip Extension  3+/5    Left Hip External Rotation  4-/5    Left Hip Internal Rotation  4-/5    Left Hip ABduction  4/5    Left Hip ADduction  4-/5    Right Knee Flexion  4/5    Right Knee Extension  4/5    Left Knee Flexion  4/5    Left Knee Extension  4/5  Right Ankle Dorsiflexion  4/5    Left Ankle Dorsiflexion  4/5      Ambulation/Gait   Ambulation/Gait Assistance  5: Supervision     Assistive device  None    Gait Pattern  Trunk flexed;Shuffle;Festinating;Poor foot [name] - left;Poor foot [name] - right;Decreased step length - right;Decreased step length - left;Decreased stride length;Decreased hip/knee flexion - left;Decreased hip/knee flexion - right;Decreased dorsiflexion - left;Decreased dorsiflexion - right    Ambulation Surface  Level;Indoor    Gait velocity  1.65 ft/sec w/o AD      Standardized Balance Assessment   Standardized Balance Assessment  Berg Balance Test;10 meter walk test;Timed Up and Go Test    10 Meter Walk  19.82      Berg Balance Test   Sit to Stand  Able to stand  independently using hands    Standing Unsupported  Able to stand 2 minutes with supervision    Sitting with Back Unsupported but Feet Supported on Floor or Stool  Able to sit safely and securely 2 minutes    Stand to Sit  Controls descent by using hands    Transfers  Able to transfer safely, minor use of hands    Standing Unsupported with Eyes Closed  Able to stand 10 seconds with supervision    Standing Ubsupported with Feet Together  Able to place feet together independently and stand for 1 minute with supervision    From Standing, Reach Forward with Outstretched Arm  Can reach forward >5 cm safely (2")    From Standing Position, Pick up Object from Floor  Able to pick up shoe, needs supervision    From Standing Position, Turn to Look Behind Over each Shoulder  Turn sideways only but maintains balance    Turn 360 Degrees  Able to turn 360 degrees safely but slowly    Standing Unsupported, Alternately Place Feet on Step/Stool  Able to complete 4 steps without aid or supervision    Standing Unsupported, One Foot in Front  Able to take small step independently and hold 30 seconds    Standing on One Leg  Unable to try or needs assist to prevent fall    Total Score  36      Timed Up and Go Test   Normal TUG (seconds)  18.19             Objective measurements completed on  examination: See above findings.      [facility] Adult PT Treatment/Exercise - 12/12/17 1447      Knee/Hip Exercises: Aerobic   Nustep  lvl 4 x 6'        [facility]) - 12/12/17 1447    PWR! exercises  Functional moves    PWR! Step Through Forward/Back  x20 each B isolating fwd & back; x20 combined fwd/back; 1 hand support on counter;; carryover into fwd/back gait along edge of counter, progressing to unsupported gait          PT Education - 12/12/17 1530    Education provided  Yes    Education Details  PT evaluation findings & status relative to last OP PT visit on 11/21/17; anticipated POC    Person(s) Educated  Patient;Child(ren) son-in-law    Methods  Explanation    Comprehension  Verbalized understanding       PT Short Term Goals - 11/21/17 1541      PT SHORT TERM GOAL #1   Title  Independent with initial HEP as indicated    Status  Achieved      PT SHORT TERM GOAL #2   Title  Pt will improve gait speed to >/= 1.8 ft/sec to reduce fall risk    Status  Achieved        PT Long Term Goals - 12/12/17 1532      PT LONG TERM GOAL #1   Title  Pt will be independent with advanced HEP as indicated    Status  New    Target Date  01/27/18      PT LONG TERM GOAL #2   Title  B LE strength to >/= 4/5 for improved function     Status  New    Target Date  01/27/18      PT LONG TERM GOAL #3   Title  Pt will demonstrate improvement on the Berg Balance Scale to 45/56 or greater to reduce risk for falls     Status  New    Target Date  01/27/18      PT LONG TERM GOAL #4   Title  Pt will improve gait speed to 2.6 ft/sec or greater for improved safety with community ambulation     Status  New    Target Date  01/27/18      PT LONG TERM GOAL #5   Title  Pt will complete TUG in </= 15 sec to improve safety with transitions    Status  New    Target Date  01/27/18             Plan - 12/12/17 1532    Clinical Impression Statement  Kacey is an 83 y/o male who presents  to OP PT for a moderate complexity eval for debility following recent hospitalization from 11/23/17 - 11/30/17 for IR repair of AAA endoleak complicated by acute respiratory failure and PNA requiring inpt rehab stay 11/30/17 -12/07/17. Pt has history of Parkinsonism along with low back pain from osteoarthritis and lumbar spinal stenosis which creates a sensation of "heaviness and tired feeling" in legs as well as an intermittent "bubbling" (burning) feeling in his feet, together which contribute to lack of coordination, decreased balance and gait instability. Pt has completed prior PT episodes for similar complaints, benefitting from PT during each episode, with the most recent episode cut short due to hospitalization in Nov 2018. Since hospitalization, pt has demonstrated a mild decline in LE strength, a decrease in gait speed from 2.05 ft/sec to 1.65 ft/sec (previously 2.44 ft/sec upon completion of PT episode in July of this year), slight increase in TUG time from 17.56 sec to 18.19 sec (previously 13.47 sec as of July 2018), and a decline in balance per the Berg to 36/56 from 39/56 (previously 46/56 as of July 2018). Balance testing indicates a very high risk for falls, with gait speed also indicating a recurrent fall risk. Fatigue noted during assessment with pt requiring periodic rest breaks during testing. Given recent decline related to hospitalization, prior positive response to PT and higher PLOF, pt demonstrates good potential to benefit from skilled PT with POC to focus on core/proximal LE strengthening to improve posture and lumbar support as well as balance, along with balance training and dynamic gait activities to decrease risk for falls and reduce likelihood of repeat hospitalization.    History and Personal Factors relevant to plan of care:  Advanced age, Parkinsonism, OA of mutiple joints, lumbar stenosis, LE neuropathy, HTN, COPD, CKD, recent hospitalization for IR repair of AAA endoleak  complicated by acute resp failure &  PNA requiring inpt rehab stay    Clinical Presentation  Evolving    Clinical Presentation due to:  pt recovering from extended hospital & inpt rehab stay due to resp complications following admission for IR repair of AAA endoleak     Clinical Decision Making  Moderate    Rehab Potential  Good    Clinical Impairments Affecting Rehab Potential  Advanced age, Parkinsonism, OA of mutiple joints, lumbar stenosis, LE neuropathy, HTN, COPD, CKD, recent hospitalization for IR repair of AAA endoleak complicated by acute resp failure & PNA requiring inpt rehab stay    PT Frequency  2x / week    PT Duration  6 weeks    PT Treatment/Interventions  Patient/family education;Neuromuscular re-education;Balance training;Therapeutic exercise;Therapeutic activities;Functional mobility training;Gait training;Manual techniques;Taping;Dry needling;Electrical Stimulation;Moist Heat;Cryotherapy;ADLs/Self Care Home Management    Consulted and Agree with Plan of Care  Patient;Family member/caregiver    Family Member Consulted  Son-in-law       Patient will benefit from skilled therapeutic intervention in order to improve the following deficits and impairments:  Abnormal gait, Difficulty walking, Decreased activity tolerance, Decreased balance, Decreased coordination, Decreased safety awareness, Decreased strength, Impaired flexibility, Decreased range of motion, Postural dysfunction, Improper body mechanics, Pain, Impaired sensation  Visit Diagnosis: Other abnormalities of gait and mobility  Unsteadiness on feet  Difficulty in walking, not elsewhere classified  Muscle weakness (generalized)  Other symptoms and signs involving the musculoskeletal system  Other symptoms and signs involving the nervous system  Abnormal posture  G-Codes - 01-11-2018 1532    Functional Assessment Tool Used (Outpatient Only)  Berg & TUG + clinical judgement    Functional Limitation  Mobility:  Walking and moving around    Mobility: Walking and Moving Around Current Status [phone])  At least 40 percent but less than 60 percent impaired, limited or restricted    Mobility: Walking and Moving Around Goal Status [phone])  At least 20 percent but less than 40 percent impaired, limited or restricted        Problem List Patient Active Problem List   Diagnosis Date Noted  . Benign prostatic hyperplasia with nocturia 12/04/2017  . Cough in adult   . Dysphagia   . Pneumonia of left lower lobe due to infectious organism ([place])   . Hypoalbuminemia due to protein-calorie malnutrition ([place])   . Benign essential HTN   . Thrombocytopenia ([place])   . Anemia of chronic disease   . Chronic kidney disease (CKD), stage IV (severe) ([place])   . Debility 11/29/2017  . Leaking abdominal aortic aneurysm (AAA) ([place]) 11/24/2017  . Pressure injury of skin 11/23/2017  . Parkinsonism ([place]) 07/10/2017  . Status post dilatation of esophageal stricture 05/15/2017  . Pancytopenia ([place]) 03/31/2016  . Follow-up --- PCP NOTES 09/02/2015  . Neuropathy 06/10/2015  . Pedal edema 01/30/2015  . Hip pain, chronic 01/30/2015  . Back pain 01/30/2015  . Cramps of lower extremity 01/30/2015  . Aftercare following surgery of the circulatory system, [place] 08/05/2014  . Annual physical exam 10/19/2011  . DJD (degenerative joint disease) 12/14/2010  . Anemia 06/06/2009  . COPD (chronic obstructive pulmonary disease) ([place]) 06/06/2009  . RENAL CELL CANCER 12/25/2008  . HTN (hypertension) 12/25/2008  . AAA (abdominal aortic aneurysm) without rupture ([place]) 12/25/2008  . CKD (chronic kidney disease) 12/25/2008  . HYPERCHOLESTEROLEMIA 03/01/2008  . GERD 03/01/2008  . ACNE ROSACEA 11/29/2007  . DIVERTICULOSIS, COLON 04/12/2007    Percival Spanish, PT, MPT 11-Jan-2018, 5:52 PM  [facility] [address]  [place], [place], [address] Phone: [phone]   Fax:   [phone]  Name: [name] MRN: [id number] Date of Birth: 1927/03/26

## 2017-12-13 ENCOUNTER — Ambulatory Visit: Payer: Medicare Other | Admitting: Physical Therapy

## 2017-12-13 ENCOUNTER — Encounter: Payer: Self-pay | Admitting: Physical Therapy

## 2017-12-13 DIAGNOSIS — R29818 Other symptoms and signs involving the nervous system: Secondary | ICD-10-CM

## 2017-12-13 DIAGNOSIS — R29898 Other symptoms and signs involving the musculoskeletal system: Secondary | ICD-10-CM

## 2017-12-13 DIAGNOSIS — R2681 Unsteadiness on feet: Secondary | ICD-10-CM | POA: Diagnosis not present

## 2017-12-13 DIAGNOSIS — R262 Difficulty in walking, not elsewhere classified: Secondary | ICD-10-CM

## 2017-12-13 DIAGNOSIS — N184 Chronic kidney disease, stage 4 (severe): Secondary | ICD-10-CM | POA: Diagnosis not present

## 2017-12-13 DIAGNOSIS — M6281 Muscle weakness (generalized): Secondary | ICD-10-CM

## 2017-12-13 DIAGNOSIS — N281 Cyst of kidney, acquired: Secondary | ICD-10-CM | POA: Diagnosis not present

## 2017-12-13 DIAGNOSIS — R2689 Other abnormalities of gait and mobility: Secondary | ICD-10-CM | POA: Diagnosis not present

## 2017-12-13 DIAGNOSIS — I4891 Unspecified atrial fibrillation: Secondary | ICD-10-CM | POA: Diagnosis not present

## 2017-12-13 DIAGNOSIS — D61818 Other pancytopenia: Secondary | ICD-10-CM | POA: Diagnosis not present

## 2017-12-13 DIAGNOSIS — N189 Chronic kidney disease, unspecified: Secondary | ICD-10-CM | POA: Diagnosis not present

## 2017-12-13 DIAGNOSIS — D631 Anemia in chronic kidney disease: Secondary | ICD-10-CM | POA: Diagnosis not present

## 2017-12-13 DIAGNOSIS — Z862 Personal history of diseases of the blood and blood-forming organs and certain disorders involving the immune mechanism: Secondary | ICD-10-CM | POA: Diagnosis not present

## 2017-12-13 DIAGNOSIS — R293 Abnormal posture: Secondary | ICD-10-CM

## 2017-12-13 NOTE — Therapy (Signed)
[facility] [address]  [place], [place], [address] Phone: [phone]   Fax:  [phone]  Physical Therapy Treatment  Patient Details  Name: [name] MRN: [id number] Date of Birth: 09/27/1927 Referring Provider: Reesa Chew, PA-C   Encounter Date: 12/13/2017  PT End of Session - 12/13/17 1445    Visit Number  2    Number of Visits  12    Date for PT Re-Evaluation  01/27/18    Authorization Type  [place] Medicare / BCBS    PT Start Time  5102    PT Stop Time  1534    PT Time Calculation (min)  49 min    Activity Tolerance  Patient tolerated treatment well;Patient limited by fatigue    Behavior During Therapy  [facility] for tasks assessed/performed       Past Medical History:  Diagnosis Date  . Abdominal aortic aneurysm ([place]) 09/2008   s/o endovascular repair and angiogram 10/04/08   . Anemia    EGD Cscope 08-2012  . Arthritis    "hips" (11/23/2017)  . Atrial fibrillation ([place])   . Bleeding stomach ulcer 1990s  . Bleeding ulcer 1997  . CKD (chronic kidney disease), stage IV (HCC)    Stable, Dr. Dimas Aguas  . COPD (chronic obstructive pulmonary disease) ([place])    "mild" (11/23/2017)  . Dementia    "on very small dose of Aricept" (11/23/2017)  . H/O hyperkalemia    Stable  . Hypertension   . Leukopenia 2/11   thought to be from doxy  . Pneumonia 2018 X 2  . Renal cell carcinoma    clear cell type dx 10/09, s/p excision 10-24-08  . Renal insufficiency   . Solitary left kidney    W/ chronic tubule-interstitial damage, Dr. Dimas Aguas  . Spinal stenosis    causing LE weakness-persistant, being worked up by Hydrographic surveyor and neurologist    Past Surgical History:  Procedure Laterality Date  . ABDOMINAL AORTIC ANEURYSM REPAIR  2009   EVAR  . CATARACT EXTRACTION W/ INTRAOCULAR LENS  IMPLANT, BILATERAL Bilateral   . HEMORRHOID SURGERY  1990  . IR ANGIOGRAM PELVIS SELECTIVE OR SUPRASELECTIVE  11/24/2017   . IR ANGIOGRAM SELECTIVE EACH ADDITIONAL VESSEL  11/24/2017  . IR ANGIOGRAM SELECTIVE EACH ADDITIONAL VESSEL  11/24/2017  . IR ANGIOGRAM SELECTIVE EACH ADDITIONAL VESSEL  11/24/2017  . IR ANGIOGRAM SELECTIVE EACH ADDITIONAL VESSEL  11/24/2017  . IR ANGIOGRAM VISCERAL SELECTIVE  11/24/2017  . IR ANGIOGRAM VISCERAL SELECTIVE  11/24/2017  . IR RADIOLOGIST EVAL & MGMT  09/07/2017  . IR US GUIDE VASC ACCESS RIGHT  11/24/2017  . [place]), 2004  . NEPHRECTOMY  2009   Open nephrectomy for cancer    There were no vitals filed for this visit.  Subjective Assessment - 12/13/17 1448    Subjective  Recent hospitalization (11/23/17 - 11/30/17)  for IR repair of AAA endoleak complicated by acute resp failure & PNA requiring inpt rehab stay (11/30/17 - 12/07/17). Discharge recommendations from inpt rehab included use of RW and 24 hr supervision. Pt's son-in-law states he has been staying overnight with pt until last night, with pt on his own for first time last night. Pt reports intermittently using RW at home, although less recently and presents to OP PT w/o AD. Pt reports he has been trying to resume HEPs from prior OP PT episodes.     Patient Stated Goals  "  walking better"    Currently in Pain?  No/denies    Pain Score  0-No pain    Pain Onset  More than a month ago         [facility] PT Assessment - 12/12/17 1447      Assessment   Medical Diagnosis  Debility, resp failure/PNA    Referring Provider  Reesa Chew, PA-C    Onset Date/Surgical Date  11/23/17    Next MD Visit  12/30/17 with Kathlene November, MD (PCP) & 01/04/18 with Basil Dess, MD (ortho)    Prior Therapy  Inpt rehab 11/30/17 - 12/07/17; mutiple OP episodes for Parkinson's, balance & coordination deficits, and lumbar stenosisi      Precautions   Precautions  Fall      Balance Screen   Has the patient fallen in the past 6 months  No    Has the patient had a decrease in activity level because of a fear of falling?   No    Is  the patient reluctant to leave their home because of a fear of falling?   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      Coordination   Gross Motor Movements are Fluid and Coordinated  No    Fine Motor Movements are Fluid and Coordinated  No    Coordination and Movement Description  slow and uncoordinated LE movement, shuffling with gait and bradykinesia    Heel Shin Test  slow and uncoordinated      Strength   Overall Strength Comments  tested in sitting    Right Hip Flexion  4-/5    Right Hip Extension  3+/5    Right Hip External Rotation   4-/5    Right Hip Internal Rotation  4-/5    Right Hip ABduction  4/5    Right Hip ADduction  4-/5    Left Hip Flexion  4-/5    Left Hip Extension  3+/5    Left Hip External Rotation  4-/5    Left Hip Internal Rotation  4-/5    Left Hip ABduction  4/5    Left Hip ADduction  4-/5    Right Knee Flexion  4/5    Right Knee Extension  4/5    Left Knee Flexion  4/5    Left Knee Extension  4/5    Right Ankle Dorsiflexion  4/5    Left Ankle Dorsiflexion  4/5      Ambulation/Gait   Ambulation/Gait Assistance  5: Supervision    Assistive device  None    Gait Pattern  Trunk flexed;Shuffle;Festinating;Poor foot clearance - left;Poor foot clearance - right;Decreased step length - right;Decreased step length - left;Decreased stride length;Decreased hip/knee flexion - left;Decreased hip/knee flexion - right;Decreased dorsiflexion - left;Decreased dorsiflexion -  right    Ambulation  Surface  Level;Indoor    Gait velocity  1.65 ft/sec w/o AD      Standardized Balance Assessment   Standardized Balance Assessment  Berg Balance Test;10 meter walk test;Timed Up and Go Test    10 Meter Walk  19.82      Berg Balance Test   Sit to Stand  Able to stand  independently using hands    Standing Unsupported  Able to stand 2 minutes with supervision    Sitting with Back Unsupported but Feet Supported on Floor or Stool  Able to sit safely and securely 2 minutes    Stand to Sit  Controls descent by using hands    Transfers  Able to transfer safely, minor use of hands    Standing Unsupported with Eyes Closed  Able to stand 10 seconds with supervision    Standing Ubsupported with Feet Together  Able to place feet together independently and stand for 1 minute with supervision    From Standing, Reach Forward with Outstretched Arm  Can reach forward >5 cm safely (2")    From Standing Position, Pick up Object from [place] to pick up shoe, needs supervision    From Standing Position, Turn to Look Behind Over each Shoulder  Turn sideways only but maintains balance    Turn 360 Degrees  Able to turn 360 degrees safely but slowly    Standing Unsupported, Alternately Place Feet on Step/Stool  Able to complete 4 steps without aid or supervision    Standing Unsupported, One Foot in [place] to take small step independently and hold 30 seconds    Standing on One Leg  Unable to try or needs assist to prevent fall    Total Score  36      Timed Up and Go Test   Normal TUG (seconds)  18.19                  [facility] Adult PT Treatment/Exercise - 12/13/17 1445      Ambulation/Gait   Ambulation/Gait Assistance  5: Supervision    Ambulation/Gait Assistance Details  cues for carryover of PWR! step, emphaszing increased stride length, good foot clearance and heel srike on weight acceptance    Ambulation Distance (Feet)  140 Feet 90 + 50 with seated rest break     Assistive device  None    Gait Pattern  Trunk flexed;Shuffle;Festinating;Poor foot clearance - left;Poor foot clearance - right;Decreased step length - right;Decreased step length - left;Decreased stride length;Decreased hip/knee flexion - left;Decreased hip/knee flexion - right;Decreased dorsiflexion - left;Decreased dorsiflexion - right    Ambulation Surface  Level;Indoor      Lumbar Exercises: Machines for Strengthening   Other Lumbar Machine Exercise  BATCA low row 15# x15      Lumbar Exercises: Standing   Other Standing Lumbar Exercises  Trunk extension roll-up on pool noodle on wall + B shoulder extension x10    Other Standing Lumbar Exercises  Trunk extension into pool noodle on wall + yellow TB horiz ABD/scap squeeze 10x3" each      Knee/Hip Exercises: Aerobic   Nustep  lvl 5 x 6'      Knee/Hip Exercises: Standing   Heel Raises  Both;10 reps;3 seconds    Hip Abduction  Both;10 reps;Knee straight;Stengthening    Abduction Limitations  2#    Hip Extension  Both;10 reps;Knee straight;Stengthening    Extension Limitations  2#    Other Standing Knee Exercises  alt toe clears  to 9" stool standing on Airex pad in corner x10      Knee/Hip Exercises: Seated   Long Arc Quad  Both;10 reps;Weights;Strengthening    Long Arc Quad Weight  2 lbs.    Marching  Both;10 reps;Weights;Strengthening    Marching Weights  2 lbs.    Hamstring Curl  Both;10 reps    Hamstring Limitations  red TB        [facility]) - 12/13/17 1445    PWR! exercises  Functional moves    PWR! Step Through Forward/Back  x20 combined fwd/back; 1 hand support on counter;; carryover into fwd/back gait along edge of counter, progressing to unsupported gait          PT Education - 12/12/17 1530    Education provided  Yes    Education Details  PT evaluation findings & status relative to last OP PT visit on 11/21/17; anticipated POC    Person(s) Educated  Patient;Child(ren) son-in-law    Methods  Explanation     Comprehension  Verbalized understanding       PT Short Term Goals - 11/21/17 1541      PT SHORT TERM GOAL #1   Title  Independent with initial HEP as indicated    Status  Achieved      PT SHORT TERM GOAL #2   Title  Pt will improve gait speed to >/= 1.8 ft/sec to reduce fall risk    Status  Achieved        PT Long Term Goals - 12/13/17 1449      PT LONG TERM GOAL #1   Title  Pt will be independent with advanced HEP as indicated    Status  On-going      PT LONG TERM GOAL #2   Title  B LE strength to >/= 4/5 for improved function     Status  On-going      PT LONG TERM GOAL #3   Title  Pt will demonstrate improvement on the Berg Balance Scale to 45/56 or greater to reduce risk for falls     Status  On-going      PT LONG TERM GOAL #4   Title  Pt will improve gait speed to 2.6 ft/sec or greater for improved safety with community ambulation     Status  On-going      PT LONG TERM GOAL #5   Title  Pt will complete TUG in </= 15 sec to improve safety with transitions    Status  On-going            Plan - 12/13/17 1449    Clinical Impression Statement  Focused on overall core and LE strengthening today with pt rapidily fatiguing, necessitating frequent seated rest breaks between most activities. Continued to promote upright posture and increased stride length and foot clearance with gait to reduce fall risk. Discussed need for frequent rest breaks during HEP with son-in-law suggesting completing a few exercises at a time spread throughout the day.    Rehab Potential  Good    Clinical Impairments Affecting Rehab Potential  Advanced age, Parkinsonism, OA of mutiple joints, lumbar stenosis, LE neuropathy, HTN, COPD, CKD, recent hospitalization for IR repair of AAA endoleak complicated by acute resp failure & PNA requiring inpt rehab stay    PT Treatment/Interventions  Patient/family education;Neuromuscular re-education;Balance training;Therapeutic exercise;Therapeutic  activities;Functional mobility training;Gait training;Manual techniques;Taping;Dry needling;Electrical Stimulation;Moist Heat;Cryotherapy;ADLs/Self Care Home Management    Consulted and Agree with Plan of Care  Patient;Family member/caregiver  Family Member Consulted  Son-in-law       Patient will benefit from skilled therapeutic intervention in order to improve the following deficits and impairments:  Abnormal gait, Difficulty walking, Decreased activity tolerance, Decreased balance, Decreased coordination, Decreased safety awareness, Decreased strength, Impaired flexibility, Decreased range of motion, Postural dysfunction, Improper body mechanics, Pain, Impaired sensation  Visit Diagnosis: Other abnormalities of gait and mobility  Unsteadiness on feet  Difficulty in walking, not elsewhere classified  Muscle weakness (generalized)  Other symptoms and signs involving the musculoskeletal system  Other symptoms and signs involving the nervous system  Abnormal posture   G-Codes - 12/28/2017 1532    Functional Assessment Tool Used (Outpatient Only)  Berg & TUG + clinical judgement    Functional Limitation  Mobility: Walking and moving around    Mobility: Walking and Moving Around Current Status [phone])  At least 40 percent but less than 60 percent impaired, limited or restricted    Mobility: Walking and Moving Around Goal Status [phone])  At least 20 percent but less than 40 percent impaired, limited or restricted       Problem List Patient Active Problem List   Diagnosis Date Noted  . Benign prostatic hyperplasia with nocturia 12/04/2017  . Cough in adult   . Dysphagia   . Pneumonia of left lower lobe due to infectious organism ([place])   . Hypoalbuminemia due to protein-calorie malnutrition ([place])   . Benign essential HTN   . Thrombocytopenia ([place])   . Anemia of chronic disease   . Chronic kidney disease (CKD), stage IV (severe) ([place])   . Debility 11/29/2017  . Leaking abdominal  aortic aneurysm (AAA) ([place]) 11/24/2017  . Pressure injury of skin 11/23/2017  . Parkinsonism ([place]) 07/10/2017  . Status post dilatation of esophageal stricture 05/15/2017  . Pancytopenia ([place]) 03/31/2016  . Follow-up --- PCP NOTES 09/02/2015  . Neuropathy 06/10/2015  . Pedal edema 01/30/2015  . Hip pain, chronic 01/30/2015  . Back pain 01/30/2015  . Cramps of lower extremity 01/30/2015  . Aftercare following surgery of the circulatory system, [place] 08/05/2014  . Annual physical exam 10/19/2011  . DJD (degenerative joint disease) 12/14/2010  . Anemia 06/06/2009  . COPD (chronic obstructive pulmonary disease) ([place]) 06/06/2009  . RENAL CELL CANCER 12/25/2008  . HTN (hypertension) 12/25/2008  . AAA (abdominal aortic aneurysm) without rupture ([place]) 12/25/2008  . CKD (chronic kidney disease) 12/25/2008  . HYPERCHOLESTEROLEMIA 03/01/2008  . GERD 03/01/2008  . ACNE ROSACEA 11/29/2007  . DIVERTICULOSIS, COLON 04/12/2007    Percival Spanish, PT, MPT 12/13/2017, 3:55 PM  [facility] [address]  [place], [place], [address] Phone: [phone]   Fax:  [phone]  Name: [name] MRN: [id number] Date of Birth: 1927-10-13

## 2017-12-14 DIAGNOSIS — I4891 Unspecified atrial fibrillation: Secondary | ICD-10-CM | POA: Insufficient documentation

## 2017-12-14 DIAGNOSIS — M4800 Spinal stenosis, site unspecified: Secondary | ICD-10-CM | POA: Insufficient documentation

## 2017-12-14 DIAGNOSIS — Q600 Renal agenesis, unilateral: Secondary | ICD-10-CM | POA: Insufficient documentation

## 2017-12-14 DIAGNOSIS — IMO0002 Reserved for concepts with insufficient information to code with codable children: Secondary | ICD-10-CM | POA: Insufficient documentation

## 2017-12-14 DIAGNOSIS — Z8639 Personal history of other endocrine, nutritional and metabolic disease: Secondary | ICD-10-CM | POA: Insufficient documentation

## 2017-12-14 DIAGNOSIS — F0390 Unspecified dementia without behavioral disturbance: Secondary | ICD-10-CM | POA: Insufficient documentation

## 2017-12-21 ENCOUNTER — Encounter: Payer: Self-pay | Admitting: Physical Therapy

## 2017-12-21 ENCOUNTER — Ambulatory Visit: Payer: Medicare Other | Admitting: Physical Therapy

## 2017-12-21 DIAGNOSIS — R293 Abnormal posture: Secondary | ICD-10-CM

## 2017-12-21 DIAGNOSIS — R2681 Unsteadiness on feet: Secondary | ICD-10-CM | POA: Diagnosis not present

## 2017-12-21 DIAGNOSIS — R29818 Other symptoms and signs involving the nervous system: Secondary | ICD-10-CM

## 2017-12-21 DIAGNOSIS — R2689 Other abnormalities of gait and mobility: Secondary | ICD-10-CM

## 2017-12-21 DIAGNOSIS — R29898 Other symptoms and signs involving the musculoskeletal system: Secondary | ICD-10-CM | POA: Diagnosis not present

## 2017-12-21 DIAGNOSIS — R262 Difficulty in walking, not elsewhere classified: Secondary | ICD-10-CM

## 2017-12-21 DIAGNOSIS — M6281 Muscle weakness (generalized): Secondary | ICD-10-CM | POA: Diagnosis not present

## 2017-12-21 NOTE — Therapy (Signed)
[facility] [address]  [place], [place], [address] Phone: [phone]   Fax:  [phone]  Physical Therapy Treatment  Patient Details  Name: [name] MRN: [id number] Date of Birth: April 18, 1927 Referring Provider: Reesa Chew, PA-C   Encounter Date: 12/21/2017  PT End of Session - 12/21/17 1537    Visit Number  3    Number of Visits  12    Date for PT Re-Evaluation  01/27/18    Authorization Type  [place] Medicare / BCBS    PT Start Time  1533    PT Stop Time  1613    PT Time Calculation (min)  40 min    Activity Tolerance  Patient tolerated treatment well    Behavior During Therapy  [facility] for tasks assessed/performed       Past Medical History:  Diagnosis Date  . Abdominal aortic aneurysm ([place]) 09/2008   s/o endovascular repair and angiogram 10/04/08   . Anemia    EGD Cscope 08-2012  . Arthritis    "hips" (11/23/2017)  . Atrial fibrillation ([place])   . Bleeding stomach ulcer 1990s  . Bleeding ulcer 1997  . CKD (chronic kidney disease), stage IV (HCC)    Stable, Dr. Dimas Aguas  . COPD (chronic obstructive pulmonary disease) ([place])    "mild" (11/23/2017)  . Dementia    "on very small dose of Aricept" (11/23/2017)  . H/O hyperkalemia    Stable  . Hypertension   . Leukopenia 2/11   thought to be from doxy  . Pneumonia 2018 X 2  . Renal cell carcinoma    clear cell type dx 10/09, s/p excision 10-24-08  . Renal insufficiency   . Solitary left kidney    W/ chronic tubule-interstitial damage, Dr. Dimas Aguas  . Spinal stenosis    causing LE weakness-persistant, being worked up by Hydrographic surveyor and neurologist    Past Surgical History:  Procedure Laterality Date  . ABDOMINAL AORTIC ANEURYSM REPAIR  2009   EVAR  . CATARACT EXTRACTION W/ INTRAOCULAR LENS  IMPLANT, BILATERAL Bilateral   . HEMORRHOID SURGERY  1990  . IR ANGIOGRAM PELVIS SELECTIVE OR SUPRASELECTIVE  11/24/2017  . IR ANGIOGRAM SELECTIVE  EACH ADDITIONAL VESSEL  11/24/2017  . IR ANGIOGRAM SELECTIVE EACH ADDITIONAL VESSEL  11/24/2017  . IR ANGIOGRAM SELECTIVE EACH ADDITIONAL VESSEL  11/24/2017  . IR ANGIOGRAM SELECTIVE EACH ADDITIONAL VESSEL  11/24/2017  . IR ANGIOGRAM VISCERAL SELECTIVE  11/24/2017  . IR ANGIOGRAM VISCERAL SELECTIVE  11/24/2017  . IR RADIOLOGIST EVAL & MGMT  09/07/2017  . IR US GUIDE VASC ACCESS RIGHT  11/24/2017  . [place] ([place]), 2004  . NEPHRECTOMY  2009   Open nephrectomy for cancer    There were no vitals filed for this visit.  Subjective Assessment - 12/21/17 1536    Subjective  doing well today - feeling slow; having some "bubbles" under the toes    Patient Stated Goals  "walking better"    Currently in Pain?  No/denies    Pain Score  0-No pain                      [facility] Adult PT Treatment/Exercise - 12/21/17 1535      Lumbar Exercises: Machines for Strengthening   Other Lumbar Machine Exercise  BATCA low row 20# x15      Lumbar Exercises: Seated   Long Arc Quad on [place]  Strengthening;Both;10  reps MIn A from PT to maintain balance    Hip Flexion on Ball  Strengthening;Both;10 reps MIn A from PT to maintain balance    Other Seated Lumbar Exercises  B UE flexion holding beach ball - seated on green pball - 2 x 10 reps; Min A from PT for balance      Knee/Hip Exercises: Aerobic   Nustep  lvl 5 x 6'      Knee/Hip Exercises: Standing   Heel Raises  Both;10 reps;3 seconds    Hip Flexion  Stengthening;Both;10 reps;Knee bent high knee marches with single UE support    Hip Abduction  Both;10 reps;Knee straight;Stengthening    Hip Extension  Both;10 reps;Knee straight;Stengthening      Knee/Hip Exercises: Seated   Hamstring Curl  Both;10 reps    Hamstring Limitations  red TB    Abduction/Adduction   Strengthening;Both;20 reps    Abd/Adduction Limitations  adduction ball squeeze               PT Short Term Goals - 11/21/17 1541      PT SHORT TERM GOAL  #1   Title  Independent with initial HEP as indicated    Status  Achieved      PT SHORT TERM GOAL #2   Title  Pt will improve gait speed to >/= 1.8 ft/sec to reduce fall risk    Status  Achieved        PT Long Term Goals - 12/13/17 1449      PT LONG TERM GOAL #1   Title  Pt will be independent with advanced HEP as indicated    Status  On-going      PT LONG TERM GOAL #2   Title  B LE strength to >/= 4/5 for improved function     Status  On-going      PT LONG TERM GOAL #3   Title  Pt will demonstrate improvement on the Berg Balance Scale to 45/56 or greater to reduce risk for falls     Status  On-going      PT LONG TERM GOAL #4   Title  Pt will improve gait speed to 2.6 ft/sec or greater for improved safety with community ambulation     Status  On-going      PT LONG TERM GOAL #5   Title  Pt will complete TUG in </= 15 sec to improve safety with transitions    Status  On-going            Plan - 12/21/17 1537    Clinical Impression Statement  PT session today focusing on proximal hip and core strengthening with good tolerance. Patient able to maintain seated upright posture on physioball with UE and LE active movements with only Min A to maintain balance - some VC provided intermittently to ensure good upright posture and to reduce forward head/rounded shoulders. Goos tolerance to standing activities, however does require CGA to close SUP for general safety as well as seated rest breaks due to fatigue. Will continue to progress patient towards goals.     Clinical Impairments Affecting Rehab Potential  Advanced age, Parkinsonism, OA of mutiple joints, lumbar stenosis, LE neuropathy, HTN, COPD, CKD, recent hospitalization for IR repair of AAA endoleak complicated by acute resp failure & PNA requiring inpt rehab stay    PT Treatment/Interventions  Patient/family education;Neuromuscular re-education;Balance training;Therapeutic exercise;Therapeutic activities;Functional mobility  training;Gait training;Manual techniques;Taping;Dry needling;Electrical Stimulation;Moist Heat;Cryotherapy;ADLs/Self Care Home Management    Consulted and Agree  with Plan of Care  Patient       Patient will benefit from skilled therapeutic intervention in order to improve the following deficits and impairments:  Abnormal gait, Difficulty walking, Decreased activity tolerance, Decreased balance, Decreased coordination, Decreased safety awareness, Decreased strength, Impaired flexibility, Decreased range of motion, Postural dysfunction, Improper body mechanics, Pain, Impaired sensation  Visit Diagnosis: Other abnormalities of gait and mobility  Unsteadiness on feet  Difficulty in walking, not elsewhere classified  Muscle weakness (generalized)  Other symptoms and signs involving the musculoskeletal system  Other symptoms and signs involving the nervous system  Abnormal posture     Problem List Patient Active Problem List   Diagnosis Date Noted  . Spinal stenosis   . Solitary left kidney   . Renal insufficiency   . Pneumonia   . Hypertension   . H/O hyperkalemia   . Dementia   . CKD (chronic kidney disease), stage IV ([place])   . Bleeding stomach ulcer   . Atrial fibrillation ([place])   . Arthritis   . Benign prostatic hyperplasia with nocturia 12/04/2017  . Cough in adult   . Dysphagia   . Pneumonia of left lower lobe due to infectious organism ([place])   . Hypoalbuminemia due to protein-calorie malnutrition ([place])   . Benign essential HTN   . Thrombocytopenia ([place])   . Anemia of chronic disease   . Chronic kidney disease (CKD), stage IV (severe) ([place])   . Debility 11/29/2017  . Leaking abdominal aortic aneurysm (AAA) ([place]) 11/24/2017  . Pressure injury of skin 11/23/2017  . Parkinsonism ([place]) 07/10/2017  . Status post dilatation of esophageal stricture 05/15/2017  . Pancytopenia ([place]) 03/31/2016  . Follow-up --- PCP NOTES 09/02/2015  . Neuropathy 06/10/2015  . Pedal edema  01/30/2015  . Hip pain, chronic 01/30/2015  . Back pain 01/30/2015  . Cramps of lower extremity 01/30/2015  . Aftercare following surgery of the circulatory system, [place] 08/05/2014  . Annual physical exam 10/19/2011  . DJD (degenerative joint disease) 12/14/2010  . Leukopenia 01/27/2010  . Anemia 06/06/2009  . COPD (chronic obstructive pulmonary disease) ([place]) 06/06/2009  . RENAL CELL CANCER 12/25/2008  . HTN (hypertension) 12/25/2008  . AAA (abdominal aortic aneurysm) without rupture ([place]) 12/25/2008  . CKD (chronic kidney disease) 12/25/2008  . Abdominal aortic aneurysm ([place]) 09/26/2008  . HYPERCHOLESTEROLEMIA 03/01/2008  . GERD 03/01/2008  . ACNE ROSACEA 11/29/2007  . DIVERTICULOSIS, COLON 04/12/2007  . Bleeding ulcer 12/28/1995     Lanney Gins, PT, DPT 12/21/17 4:31 PM   [facility] [address]  [place], [place], [address] Phone: [phone]   Fax:  [phone]  Name: [name] MRN: [id number] Date of Birth: 1927/06/08

## 2017-12-29 ENCOUNTER — Ambulatory Visit (INDEPENDENT_AMBULATORY_CARE_PROVIDER_SITE_OTHER): Payer: Medicare Other | Admitting: Cardiology

## 2017-12-29 ENCOUNTER — Encounter: Payer: Self-pay | Admitting: Cardiology

## 2017-12-29 VITALS — BP 142/82 | HR 74 | Ht 68.0 in | Wt 152.4 lb

## 2017-12-29 DIAGNOSIS — I714 Abdominal aortic aneurysm, without rupture, unspecified: Secondary | ICD-10-CM

## 2017-12-29 DIAGNOSIS — I10 Essential (primary) hypertension: Secondary | ICD-10-CM | POA: Diagnosis not present

## 2017-12-29 DIAGNOSIS — N183 Chronic kidney disease, stage 3 (moderate): Secondary | ICD-10-CM | POA: Diagnosis not present

## 2017-12-29 DIAGNOSIS — J189 Pneumonia, unspecified organism: Secondary | ICD-10-CM | POA: Insufficient documentation

## 2017-12-29 DIAGNOSIS — I480 Paroxysmal atrial fibrillation: Secondary | ICD-10-CM | POA: Diagnosis not present

## 2017-12-29 NOTE — Patient Instructions (Signed)

## 2017-12-29 NOTE — Progress Notes (Signed)
Cardiology Office Note:    Date:  12/29/2017   ID:  [name], DOB 04-10-1927, MRN [id number]  PCP:  Colon Branch, MD  Cardiologist:  Candee Furbish, MD    Referring MD: Colon Branch, MD     History of Present Illness:    [name] is a 84 y.o. male with a hx of abdominal aortic aneurysm repair with Parkinson's disease who was hospitalized/discharged on 12/07/17 with debility secondary to acute respiratory failure and hypoxia, new onset atrial fibrillation, history of abdominal aortic aneurysm with no endoleak, occluded origin of inferior mesenteric artery.  Post procedure in interventional radiology became hypoxic, right lower lobe changes on x-ray.  He was then admitted.  He then went to rehab for several days.  He has chronic anemia and pancytopenia, single kidney.  Atrial fibrillation was noted post procedure.  Denied any symptoms of chest pain or shortness of breath.  His atrial fibrillation resolved during the hospitalization.  We discussed the potentials for anticoagulation however given his underlying thrombocytopenia this would be high risk for bleeding.  [name] is his daughter.  Overall he feels well, no palpitations, no syncope bleeding orthopnea PND.  Past Medical History:  Diagnosis Date  . Abdominal aortic aneurysm ([place]) 09/2008   s/o endovascular repair and angiogram 10/04/08   . Anemia    EGD Cscope 08-2012  . Arthritis    "hips" (11/23/2017)  . Atrial fibrillation ([place])   . Bleeding stomach ulcer 1990s  . Bleeding ulcer 1997  . CKD (chronic kidney disease), stage IV (HCC)    Stable, Dr. Dimas Aguas  . COPD (chronic obstructive pulmonary disease) ([place])    "mild" (11/23/2017)  . Dementia    "on very small dose of Aricept" (11/23/2017)  . H/O hyperkalemia    Stable  . Hypertension   . Leukopenia 2/11   thought to be from doxy  . Pneumonia 2018 X 2  . Renal cell carcinoma    clear cell type dx 10/09, s/p excision 10-24-08  . Renal insufficiency   . Solitary  left kidney    W/ chronic tubule-interstitial damage, Dr. Dimas Aguas  . Spinal stenosis    causing LE weakness-persistant, being worked up by Hydrographic surveyor and neurologist    Past Surgical History:  Procedure Laterality Date  . ABDOMINAL AORTIC ANEURYSM REPAIR  2009   EVAR  . CATARACT EXTRACTION W/ INTRAOCULAR LENS  IMPLANT, BILATERAL Bilateral   . HEMORRHOID SURGERY  1990  . IR ANGIOGRAM PELVIS SELECTIVE OR SUPRASELECTIVE  11/24/2017  . IR ANGIOGRAM SELECTIVE EACH ADDITIONAL VESSEL  11/24/2017  . IR ANGIOGRAM SELECTIVE EACH ADDITIONAL VESSEL  11/24/2017  . IR ANGIOGRAM SELECTIVE EACH ADDITIONAL VESSEL  11/24/2017  . IR ANGIOGRAM SELECTIVE EACH ADDITIONAL VESSEL  11/24/2017  . IR ANGIOGRAM VISCERAL SELECTIVE  11/24/2017  . IR ANGIOGRAM VISCERAL SELECTIVE  11/24/2017  . IR RADIOLOGIST EVAL & MGMT  09/07/2017  . IR US GUIDE VASC ACCESS RIGHT  11/24/2017  . [place] 2004  . NEPHRECTOMY  2009   Open nephrectomy for cancer    Current Medications: Current Meds  Medication Sig  . aspirin EC 81 MG tablet Take 81 mg by mouth daily.  . Carbidopa-Levodopa ER (SINEMET CR) 25-100 MG tablet controlled release Take 0.5 tablets by mouth 2 (two) times daily.  . Cholecalciferol (VITAMIN D) 2000 units CAPS Take 2,000 Units by mouth daily.  Marland Kitchen docusate sodium (COLACE) 100 MG capsule Take 100 mg by mouth  daily.  . donepezil (ARICEPT) 5 MG tablet Take 5 mg by mouth at bedtime.  . FeFum-FePo-FA-B Cmp-C-Zn-Mn-Cu (SE-TAN PLUS) 162-115.2-1 MG CAPS Take 1 capsule by mouth daily. Take 1 Tab  daily  . hydrALAZINE (APRESOLINE) 25 MG tablet Take 1 tablet (25 mg total) by mouth 3 (three) times daily.  . prednisoLONE acetate (PRED FORTE) 1 % ophthalmic suspension Place 1 drop into both eyes 2 (two) times daily.  . tamsulosin (FLOMAX) 0.4 MG CAPS capsule Take 1 capsule (0.4 mg total) by mouth daily after supper.     Allergies:   Iodinated diagnostic agents; Ioxaglate; and Baclofen    Social History   Socioeconomic History  . Marital status: Widowed    Spouse name: None  . Number of children: 3  . Years of education: None  . Highest education level: None  Social Needs  . Financial resource strain: None  . Food insecurity - worry: None  . Food insecurity - inability: None  . Transportation needs - medical: None  . Transportation needs - non-medical: None  Occupational History  . Occupation: retired    Fish farm manager: RETIRED  Tobacco Use  . Smoking status: Former Smoker    Packs/day: 1.50    Years: 60.00    Pack years: 90.00    Last attempt to quit: 12/28/2007    Years since quitting: 10.0  . Smokeless tobacco: Never Used  Substance and Sexual Activity  . Alcohol use: No    Alcohol/week: 0.0 oz  . Drug use: No  . Sexual activity: None  Other Topics Concern  . None  Social History Narrative   Lives by himself    Lost wife to dementia 10-2015   Totally independent on ADL , no driving   Often came to office w/ his daughter [name] [phone]), his other daughter is  [name] , son is Daviyon                     Family History: The patient's family history includes Diabetes in his father; Hypertension in his father; Stroke in his father. There is no history of Colon cancer or Prostate cancer. ROS:   Please see the history of present illness.     All other systems reviewed and are negative.  EKGs/Labs/Other Studies Reviewed:    The following studies were reviewed today:   ECHO 11/28/17 - Left ventricle: The cavity size was normal. There was severe   concentric hypertrophy. Systolic function was normal. The   estimated ejection fraction was in the range of 55% to 60%. Wall   motion was normal; there were no regional wall motion   abnormalities. - Aortic valve: Transvalvular velocity was within the normal range.   There was no stenosis. There was mild regurgitation. - Mitral valve: Transvalvular velocity was within the normal range.   There was no  evidence for stenosis. There was mild regurgitation. - Left atrium: The atrium was severely dilated. - Right ventricle: The cavity size was normal. Wall thickness was   normal. Systolic function was normal. - Tricuspid valve: There was mild regurgitation. - Pulmonary arteries: Systolic pressure was mildly increased. PA   peak pressure: 46 mm Hg (S). - Pericardium,   EKG:  EKG from 11/27/17 - AFIB 86 RBBB  Recent Labs: 11/28/2017: TSH 2.836 11/30/2017: ALT 9; B Natriuretic Peptide 1,654.0 12/05/2017: Hemoglobin 9.1; Platelets 133 12/07/2017: BUN 47; Creatinine, Ser 2.49; Potassium 4.7; Sodium 136  Recent Lipid Panel    Component Value Date/Time  CHOL 188 05/12/2017 0909   TRIG 78.0 05/12/2017 0909   HDL 50.60 05/12/2017 0909   CHOLHDL 4 05/12/2017 0909   VLDL 15.6 05/12/2017 0909   LDLCALC 122 (H) 05/12/2017 0909   LDLDIRECT 134.4 10/19/2011 0932    Physical Exam:    VS:  BP (!) 142/82   Pulse 74   Ht 5\' 8"  (1.727 m)   Wt 152 lb 6.4 oz (69.1 kg)   SpO2 95%   BMI 23.17 kg/m     Wt Readings from Last 3 Encounters:  12/29/17 152 lb 6.4 oz (69.1 kg)  12/07/17 149 lb 4 oz (67.7 kg)  11/28/17 156 lb 1.4 oz (70.8 kg)     GEN:  Well nourished, well developed in no acute distress HEENT: Normal NECK: No JVD; No carotid bruits LYMPHATICS: No lymphadenopathy CARDIAC: RRR with rare ectopy, no murmurs, rubs, gallops RESPIRATORY:  Clear to auscultation without rales, wheezing or rhonchi  ABDOMEN: Soft, non-tender, non-distended MUSCULOSKELETAL:  No edema; No deformity  SKIN: Warm and dry NEUROLOGIC:  Alert and oriented x 3 PSYCHIATRIC:  Normal affect   ASSESSMENT:    1. PAF (paroxysmal atrial fibrillation) ([place])   2. AAA (abdominal aortic aneurysm) without rupture (HCC)    PLAN:    In order of problems listed above:  Paroxysmal atrial fibrillation -Postprocedural in interventional radiology.  Subsequently autoconverted.  We had lengthy discussion about risks and  benefits of anticoagulation and given his chronic thrombocytopenia, advanced age we decided not to anticoagulate during his hospital stay in early December.  He has had subsequent visit with hematology.  Hopefully he will continue to maintain sinus rhythm.  If atrial fibrillation recurs, he is at risk for stroke.  His hematologist stated that he could be on low-dose aspirin 81 mg a day.  He is going to continue with this.  We discussed that a trial data, aspirin does not reduce stroke risk in atrial fibrillation.     Medication Adjustments/Labs and Tests Ordered: Current medicines are reviewed at length with the patient today.  Concerns regarding medicines are outlined above.  No orders of the defined types were placed in this encounter.  No orders of the defined types were placed in this encounter.   Signed, Candee Furbish, MD  12/29/2017 3:20 PM    [facility]

## 2017-12-30 ENCOUNTER — Ambulatory Visit (INDEPENDENT_AMBULATORY_CARE_PROVIDER_SITE_OTHER): Payer: Medicare Other | Admitting: Internal Medicine

## 2017-12-30 ENCOUNTER — Encounter: Payer: Self-pay | Admitting: Internal Medicine

## 2017-12-30 VITALS — BP 136/60 | HR 71 | Temp 97.4°F | Resp 14 | Ht 68.0 in | Wt 153.1 lb

## 2017-12-30 DIAGNOSIS — N184 Chronic kidney disease, stage 4 (severe): Secondary | ICD-10-CM | POA: Diagnosis not present

## 2017-12-30 DIAGNOSIS — I714 Abdominal aortic aneurysm, without rupture, unspecified: Secondary | ICD-10-CM

## 2017-12-30 DIAGNOSIS — I480 Paroxysmal atrial fibrillation: Secondary | ICD-10-CM | POA: Diagnosis not present

## 2017-12-30 DIAGNOSIS — J9600 Acute respiratory failure, unspecified whether with hypoxia or hypercapnia: Secondary | ICD-10-CM | POA: Diagnosis not present

## 2017-12-30 DIAGNOSIS — D61818 Other pancytopenia: Secondary | ICD-10-CM

## 2017-12-30 MED ORDER — TAMSULOSIN HCL 0.4 MG PO CAPS: 0.4000 mg | ORAL_CAPSULE | Freq: Every day | ORAL | 5 refills | Status: AC

## 2017-12-30 NOTE — Progress Notes (Signed)
Pre visit review using our clinic review tool, if applicable. No additional management support is needed unless otherwise documented below in the visit note. 

## 2017-12-30 NOTE — Patient Instructions (Signed)
   GO TO THE FRONT DESK Schedule your next appointment for a check up in 6 months     

## 2017-12-30 NOTE — Progress Notes (Signed)
Subjective:    Patient ID: [name], male    DOB: [birth year], 84 y.o.   MRN: [id number]  DOS:  12/30/2017 Type of visit - description : TCM 14 Interval history:   Admitted 11/23/2017 to  12/07/2017: Admitted for an angiogram which did not show AAA leak.  Then developed postprocedure hypoxia, x-ray showed right lower lobe consolidation versus ATX, pulmonary, renal service were consulted. The was DX with new onset atrial fibrillation on 11/28/2017. He was felt not to be candidate for Coumadin due to fall risk and pancytopenia.  Was seen by hematology 12/13/2017 for pancytopenia, no indication for bone marrow biopsy at the time.  They  will see pt every 3 months. They also noted a complex cyst of the left kidney  felt to be a stable Bosniak cyst They felt safe for the patient to take aspirin up to  325 mg daily if needed  Was seen by cardiology 12/29/2017 as an outpatient, noted that the paroxysmal A. fib was postprocedural and he also converted.  Review of Systems Here w/ [name] Since he left the hospital he is doing okay.  Getting slightly stronger.  Appetite is good. Denies nausea, vomiting, diarrhea.  No blood in the stools.  No chest pain, difficulty breathing or palpitations.  Past Medical History:  Diagnosis Date  . Abdominal aortic aneurysm ([place]) 09/2008   s/o endovascular repair and angiogram 10/04/08   . Anemia    EGD Cscope 08-2012  . Arthritis    "hips" (11/23/2017)  . Atrial fibrillation ([place])   . Bleeding stomach ulcer 1990s  . Bleeding ulcer 1997  . CKD (chronic kidney disease), stage IV (HCC)    Stable, Dr. Dimas Aguas  . COPD (chronic obstructive pulmonary disease) ([place])    "mild" (11/23/2017)  . Dementia    "on very small dose of Aricept" (11/23/2017)  . H/O hyperkalemia    Stable  . Hypertension   . Leukopenia 2/11   thought to be from doxy  . Pneumonia 2018 X 2  . Renal cell carcinoma    clear cell type dx 10/09, s/p excision 10-24-08  . Renal  insufficiency   . Solitary left kidney    W/ chronic tubule-interstitial damage, Dr. Dimas Aguas  . Spinal stenosis    causing LE weakness-persistant, being worked up by Hydrographic surveyor and neurologist    Past Surgical History:  Procedure Laterality Date  . ABDOMINAL AORTIC ANEURYSM REPAIR  2009   EVAR  . CATARACT EXTRACTION W/ INTRAOCULAR LENS  IMPLANT, BILATERAL Bilateral   . HEMORRHOID SURGERY  1990  . IR ANGIOGRAM PELVIS SELECTIVE OR SUPRASELECTIVE  11/24/2017  . IR ANGIOGRAM SELECTIVE EACH ADDITIONAL VESSEL  11/24/2017  . IR ANGIOGRAM SELECTIVE EACH ADDITIONAL VESSEL  11/24/2017  . IR ANGIOGRAM SELECTIVE EACH ADDITIONAL VESSEL  11/24/2017  . IR ANGIOGRAM SELECTIVE EACH ADDITIONAL VESSEL  11/24/2017  . IR ANGIOGRAM VISCERAL SELECTIVE  11/24/2017  . IR ANGIOGRAM VISCERAL SELECTIVE  11/24/2017  . IR RADIOLOGIST EVAL & MGMT  09/07/2017  . IR US GUIDE VASC ACCESS RIGHT  11/24/2017  . [place] 2004  . NEPHRECTOMY  2009   Open nephrectomy for cancer    Social History   Socioeconomic History  . Marital status: Widowed    Spouse name: Not on file  . Number of children: 3  . Years of education: Not on file  . Highest education level: Not on file  Social Needs  . Financial resource strain: Not  on file  . Food insecurity - worry: Not on file  . Food insecurity - inability: Not on file  . Transportation needs - medical: Not on file  . Transportation needs - non-medical: Not on file  Occupational History  . Occupation: retired    Fish farm manager: RETIRED  Tobacco Use  . Smoking status: Former Smoker    Packs/day: 1.50    Years: 60.00    Pack years: 90.00    Last attempt to quit: 12/28/2007    Years since quitting: 10.0  . Smokeless tobacco: Never Used  Substance and Sexual Activity  . Alcohol use: No    Alcohol/week: 0.0 oz  . Drug use: No  . Sexual activity: Not on file  Other Topics Concern  . Not on file  Social History Narrative   Lives by himself     Lost wife to dementia 10-2015   Totally independent on ADL , no driving   Often came to office w/ his daughter [name] [phone]), his other daughter is  [name] , son is [place]                      Allergies as of 12/30/2017      Reactions   Iodinated Diagnostic Agents Other (See Comments)   Only one kidney    Ioxaglate Other (See Comments)   Only one kidney  Only one kidney    Baclofen Other (See Comments)   Unable to walk per Pt's family  Unable to walk per Pt's family  "turns him upside down"       Medication List        Accurate as of 12/30/17  2:11 PM. Always use your most recent med list.          aspirin EC 81 MG tablet Take 81 mg by mouth daily.   Carbidopa-Levodopa ER 25-100 MG tablet controlled release Commonly known as:  SINEMET CR Take 0.5 tablets by mouth 2 (two) times daily.   docusate sodium 100 MG capsule Commonly known as:  COLACE Take 100 mg by mouth daily.   donepezil 5 MG tablet Commonly known as:  ARICEPT Take 5 mg by mouth at bedtime.   hydrALAZINE 25 MG tablet Commonly known as:  APRESOLINE Take 1 tablet (25 mg total) by mouth 3 (three) times daily.   prednisoLONE acetate 1 % ophthalmic suspension Commonly known as:  PRED FORTE Place 1 drop into both eyes 2 (two) times daily.   SE-TAN PLUS 162-115.2-1 MG Caps Take 1 capsule by mouth daily. Take 1 Tab  daily   tamsulosin 0.4 MG Caps capsule Commonly known as:  FLOMAX Take 1 capsule (0.4 mg total) by mouth daily after supper.   Vitamin D 2000 units Caps Take 2,000 Units by mouth daily.          Objective:   Physical Exam BP 136/60 (BP Location: Left Arm, Patient Position: Sitting, Cuff Size: Small)   Pulse 71   Temp (!) 97.4 F (36.3 C) (Oral)   Resp 14   Ht '5\' 8"'$  (1.727 m)   Wt 153 lb 2 oz (69.5 kg)   SpO2 93%   BMI 23.28 kg/m  General:   Well developed, well nourished, elderly gentleman, chronically ill-appearing but in no distress, seems to be at baseline  HEENT:    Normocephalic . Face symmetric, atraumatic Lungs:  Decreased BS but clear Normal respiratory effort, no intercostal retractions, no accessory muscle use. Heart: RRR,  no murmur.  No  pretibial edema bilaterally  Skin: Not pale. Not jaundice Neurologic:  alert & oriented X3.  His speech is fluent, gait unassisted and appropriate for age. Psych--  No anxious or depressed appearing.     Assessment & Plan:   Assessment  Hypertension, COPD--former smoker,PFTs 8- 2013 mild obstruction otherwise normal. Asx  Paroxysmal A. fib DX 11/2017 Renal: (f/u @ BMI) --H/o Renal Cancer  --Solitary left kidney --Chronic renal insufficiency --hematuria: Dr Hazle Nordmann, cysto 01-2017 Neuro:   -on aricept per neuro, mild memory imparment -parkinson's   -neuropathy, saw neuro 06-2015, Dr Posey Pronto. As off 03-2016 is [facility]:  +[name] gen neuro; +Dr Tonye Royalty now  Dr Hall Busing, movement d/o specialist   MSK: --DJD --Mild spinal stenosis, has seen  Dr Flavia Shipper Osteopenia--DEXA 07-2015, osteopenia, rx vit D Pancytopenia: sees hem-onc (Dr Cruzita Lederer, [facility]) , 2011 felt to be from doxycycline. Last OV 09-13-2017 AAA s/p EVAR 2009 Acne rosacea Admitted 11-2017: Angiography, complicated by a respiratory failure, new onset of P- A. fib.   Plan:  Respiratory failure: Developed respiratory failure after an angiography done to rule it out a AAA endoleak.  Was seen by pulmonary, seems to be recovering well. He is back at home, under the care of his family, doing physical therapy. Paroxysmal A. fib: new issue post angiography last month, self converted, saw cardiology, no Coumadin candidate due to risk of falls, currently on aspirin.  Asxic, seems to be on NSR today. LUTS: Was noted to have some difficulty urinating during the last admission, they recommended Flomax, it seems to be helping, RF sent  Renal failure: had labs @ nephrology yesterday, has a visit next week. He sees multiple specialist, RTC 6 months

## 2018-01-01 NOTE — Assessment & Plan Note (Signed)
Respiratory failure: Developed respiratory failure after an angiography done to rule it out a AAA endoleak.  Was seen by pulmonary, seems to be recovering well. He is back at home, under the care of his family, doing physical therapy. Paroxysmal A. fib: new issue post angiography last month, self converted, saw cardiology, no Coumadin candidate due to risk of falls, currently on aspirin.  Asxic, seems to be on NSR today. LUTS: Was noted to have some difficulty urinating during the last admission, they recommended Flomax, it seems to be helping, RF sent  Renal failure: had labs @ nephrology yesterday, has a visit next week. He sees multiple specialist, RTC 6 months

## 2018-01-02 DIAGNOSIS — I10 Essential (primary) hypertension: Secondary | ICD-10-CM | POA: Diagnosis not present

## 2018-01-02 DIAGNOSIS — N183 Chronic kidney disease, stage 3 (moderate): Secondary | ICD-10-CM | POA: Diagnosis not present

## 2018-01-02 DIAGNOSIS — N390 Urinary tract infection, site not specified: Secondary | ICD-10-CM | POA: Diagnosis not present

## 2018-01-03 ENCOUNTER — Telehealth (INDEPENDENT_AMBULATORY_CARE_PROVIDER_SITE_OTHER): Payer: Self-pay | Admitting: Specialist

## 2018-01-03 ENCOUNTER — Encounter: Payer: Self-pay | Admitting: Physical Therapy

## 2018-01-03 ENCOUNTER — Ambulatory Visit: Payer: Medicare Other | Attending: Physical Medicine and Rehabilitation | Admitting: Physical Therapy

## 2018-01-03 DIAGNOSIS — M6281 Muscle weakness (generalized): Secondary | ICD-10-CM

## 2018-01-03 DIAGNOSIS — R2681 Unsteadiness on feet: Secondary | ICD-10-CM | POA: Insufficient documentation

## 2018-01-03 DIAGNOSIS — R293 Abnormal posture: Secondary | ICD-10-CM | POA: Diagnosis not present

## 2018-01-03 DIAGNOSIS — R29898 Other symptoms and signs involving the musculoskeletal system: Secondary | ICD-10-CM | POA: Diagnosis not present

## 2018-01-03 DIAGNOSIS — R262 Difficulty in walking, not elsewhere classified: Secondary | ICD-10-CM | POA: Diagnosis not present

## 2018-01-03 DIAGNOSIS — R29818 Other symptoms and signs involving the nervous system: Secondary | ICD-10-CM

## 2018-01-03 DIAGNOSIS — R2689 Other abnormalities of gait and mobility: Secondary | ICD-10-CM | POA: Insufficient documentation

## 2018-01-03 NOTE — Telephone Encounter (Signed)
Patients daughter called wanting to let Dr. Louanne Skye know that her father has been in the hospital recently, but he will finish up his PT and she will call back to schedule a follow up.

## 2018-01-03 NOTE — Therapy (Signed)
[facility] [address]  [place], [place], [address] Phone: [phone]   Fax:  [phone]  Physical Therapy Treatment  Patient Details  Name: [name] MRN: [id number] Date of Birth: [birth year] Referring Provider: Reesa Chew, PA-C   Encounter Date: 01/03/2018  PT End of Session - 01/03/18 1531    Visit Number  4    Number of Visits  12    Date for PT Re-Evaluation  01/27/18    Authorization Type  Medicare / BCBS    PT Start Time  5400    PT Stop Time  1618    PT Time Calculation (min)  47 min    Activity Tolerance  Patient tolerated treatment well    Behavior During Therapy  [name] for tasks assessed/performed       Past Medical History:  Diagnosis Date  . Abdominal aortic aneurysm ([place]) 09/2008   s/o endovascular repair and angiogram 10/04/08   . Anemia    EGD Cscope 08-2012  . Arthritis    "hips" (11/23/2017)  . Atrial fibrillation ([place])   . Bleeding stomach ulcer 1990s  . Bleeding ulcer 1997  . CKD (chronic kidney disease), stage IV (HCC)    Stable, Dr. Dimas Aguas  . COPD (chronic obstructive pulmonary disease) ([place])    "mild" (11/23/2017)  . Dementia    "on very small dose of Aricept" (11/23/2017)  . H/O hyperkalemia    Stable  . Hypertension   . Leukopenia 2/11   thought to be from doxy  . Pneumonia 2018 X 2  . Renal cell carcinoma    clear cell type dx 10/09, s/p excision 10-24-08  . Renal insufficiency   . Solitary left kidney    W/ chronic tubule-interstitial damage, Dr. Dimas Aguas  . Spinal stenosis    causing LE weakness-persistant, being worked up by Hydrographic surveyor and neurologist    Past Surgical History:  Procedure Laterality Date  . ABDOMINAL AORTIC ANEURYSM REPAIR  2009   EVAR  . CATARACT EXTRACTION W/ INTRAOCULAR LENS  IMPLANT, BILATERAL Bilateral   . HEMORRHOID SURGERY  1990  . IR ANGIOGRAM PELVIS SELECTIVE OR SUPRASELECTIVE  11/24/2017  . IR ANGIOGRAM SELECTIVE EACH  ADDITIONAL VESSEL  11/24/2017  . IR ANGIOGRAM SELECTIVE EACH ADDITIONAL VESSEL  11/24/2017  . IR ANGIOGRAM SELECTIVE EACH ADDITIONAL VESSEL  11/24/2017  . IR ANGIOGRAM SELECTIVE EACH ADDITIONAL VESSEL  11/24/2017  . IR ANGIOGRAM VISCERAL SELECTIVE  11/24/2017  . IR ANGIOGRAM VISCERAL SELECTIVE  11/24/2017  . IR RADIOLOGIST EVAL & MGMT  09/07/2017  . IR US GUIDE VASC ACCESS RIGHT  11/24/2017  . [place] ([place]), 2004  . NEPHRECTOMY  2009   Open nephrectomy for cancer    There were no vitals filed for this visit.  Subjective Assessment - 01/03/18 1537    Subjective  Pt denies pain today but reports legs feel "heavy".    Patient Stated Goals  "walking better"    Currently in Pain?  No/denies    Pain Score  0-No pain                      [facility] Adult PT Treatment/Exercise - 01/03/18 1531      Lumbar Exercises: Standing   Row  Both;15 reps;Theraband;Strengthening    Theraband Level (Row)  Level 3 (Green)    Row Limitations  staggered stance with cue s for abd bracing, 3" hold  Shoulder Extension  Both;15 reps;Theraband;Strengthening    Theraband Level (Shoulder Extension)  Level 3 (Green)    Shoulder Extension Limitations  staggered stance with cue s for abd bracing, 3" hold    Other Standing Lumbar Exercises  B pallof press with red TB x10 each      Knee/Hip Exercises: Aerobic   Nustep  lvl 6 x 6'      Knee/Hip Exercises: Standing   Functional Squat  10 reps;3 seconds    Functional Squat Limitations  counter squat    Other Standing Knee Exercises  B side-stepping & fwd monster walk along counter with looped yellow TB at ankles 2 x 10 ft      Knee/Hip Exercises: Seated   Clamshell with TheraBand  Red 20x3"    Marching  Both;10 reps;2 sets    Marching Limitations  looped red TB at knees               PT Short Term Goals - 11/21/17 1541      PT SHORT TERM GOAL #1   Title  Independent with initial HEP as indicated    Status  Achieved       PT SHORT TERM GOAL #2   Title  Pt will improve gait speed to >/= 1.8 ft/sec to reduce fall risk    Status  Achieved        PT Long Term Goals - 12/13/17 1449      PT LONG TERM GOAL #1   Title  Pt will be independent with advanced HEP as indicated    Status  On-going      PT LONG TERM GOAL #2   Title  B LE strength to >/= 4/5 for improved function     Status  On-going      PT LONG TERM GOAL #3   Title  Pt will demonstrate improvement on the Berg Balance Scale to 45/56 or greater to reduce risk for falls     Status  On-going      PT LONG TERM GOAL #4   Title  Pt will improve gait speed to 2.6 ft/sec or greater for improved safety with community ambulation     Status  On-going      PT LONG TERM GOAL #5   Title  Pt will complete TUG in </= 15 sec to improve safety with transitions    Status  On-going            Plan - 01/03/18 1539    Clinical Impression Statement  Pt reporting "heavy feeling" in legs today but denies pain or "bubbling" sensation. Continued focus on proximal extremity and core strengthening including postural and balance components, with close supervision/SBA for some activities to ensure safe balance. Pt continues to require intermittent seated and standing activities due to fatigue with prolonged standing. Dicsussed need for similiar pacing of exercises during HEP with pt's dtr.    Rehab Potential  Good    Clinical Impairments Affecting Rehab Potential  Advanced age, Parkinsonism, OA of mutiple joints, lumbar stenosis, LE neuropathy, HTN, COPD, CKD, recent hospitalization for IR repair of AAA endoleak complicated by acute resp failure & PNA requiring inpt rehab stay    PT Treatment/Interventions  Patient/family education;Neuromuscular re-education;Balance training;Therapeutic exercise;Therapeutic activities;Functional mobility training;Gait training;Manual techniques;Taping;Dry needling;Electrical Stimulation;Moist Heat;Cryotherapy;ADLs/Self Care Home  Management    Consulted and Agree with Plan of Care  Patient       Patient will benefit from skilled therapeutic intervention in order to improve  the following deficits and impairments:  Abnormal gait, Difficulty walking, Decreased activity tolerance, Decreased balance, Decreased coordination, Decreased safety awareness, Decreased strength, Impaired flexibility, Decreased range of motion, Postural dysfunction, Improper body mechanics, Pain, Impaired sensation  Visit Diagnosis: Other abnormalities of gait and mobility  Unsteadiness on feet  Difficulty in walking, not elsewhere classified  Muscle weakness (generalized)  Other symptoms and signs involving the musculoskeletal system  Other symptoms and signs involving the nervous system  Abnormal posture     Problem List Patient Active Problem List   Diagnosis Date Noted  . Recurrent pneumonia 12/29/2017  . Spinal stenosis   . Solitary left kidney   . Renal insufficiency   . H/O hyperkalemia   . Dementia   . Bleeding stomach ulcer   . Atrial fibrillation ([place])   . Arthritis   . Benign prostatic hyperplasia with nocturia 12/04/2017  . Hypoalbuminemia due to protein-calorie malnutrition ([place])   . Thrombocytopenia ([place])   . Chronic kidney disease (CKD), stage IV (severe) ([place])   . Debility 11/29/2017  . Parkinsonism ([place]) 07/10/2017  . Status post dilatation of esophageal stricture 05/15/2017  . Pancytopenia ([place]) 03/31/2016  . Follow-up --- PCP NOTES 09/02/2015  . Neuropathy 06/10/2015  . Pedal edema 01/30/2015  . Hip pain, chronic 01/30/2015  . Cramps of lower extremity 01/30/2015  . Annual physical exam 10/19/2011  . DJD (degenerative joint disease) 12/14/2010  . Leukopenia 01/27/2010  . Anemia 06/06/2009  . COPD (chronic obstructive pulmonary disease) ([place]) 06/06/2009  . RENAL CELL CANCER 12/25/2008  . HTN (hypertension) 12/25/2008  . AAA (abdominal aortic aneurysm) without rupture ([place]) 12/25/2008  . CKD  (chronic kidney disease) 12/25/2008  . HYPERCHOLESTEROLEMIA 03/01/2008  . GERD 03/01/2008  . ACNE ROSACEA 11/29/2007  . DIVERTICULOSIS, COLON 04/12/2007    Percival Spanish, PT, MPT 01/03/2018, 6:21 PM  [facility] [address]  [place], [place], [address] Phone: [phone]   Fax:  [phone]  Name: [name] MRN: [id number] Date of Birth: [birth year]

## 2018-01-03 NOTE — Telephone Encounter (Signed)
Patients daughter called wanting to let Dr. Louanne Skye know that her father has been in the hospital recently, but he will finish up his PT and she will call back to schedule a follow up. CB # [phone]

## 2018-01-04 ENCOUNTER — Ambulatory Visit (INDEPENDENT_AMBULATORY_CARE_PROVIDER_SITE_OTHER): Payer: Medicare Other | Admitting: Specialist

## 2018-01-05 ENCOUNTER — Ambulatory Visit: Payer: Medicare Other | Admitting: Physical Therapy

## 2018-01-05 ENCOUNTER — Encounter: Payer: Self-pay | Admitting: Physical Therapy

## 2018-01-05 DIAGNOSIS — M6281 Muscle weakness (generalized): Secondary | ICD-10-CM

## 2018-01-05 DIAGNOSIS — R2689 Other abnormalities of gait and mobility: Secondary | ICD-10-CM

## 2018-01-05 DIAGNOSIS — R29898 Other symptoms and signs involving the musculoskeletal system: Secondary | ICD-10-CM

## 2018-01-05 DIAGNOSIS — R262 Difficulty in walking, not elsewhere classified: Secondary | ICD-10-CM

## 2018-01-05 DIAGNOSIS — R2681 Unsteadiness on feet: Secondary | ICD-10-CM

## 2018-01-05 DIAGNOSIS — R29818 Other symptoms and signs involving the nervous system: Secondary | ICD-10-CM | POA: Diagnosis not present

## 2018-01-05 DIAGNOSIS — R293 Abnormal posture: Secondary | ICD-10-CM

## 2018-01-05 NOTE — Therapy (Signed)
[facility] [address]  [place], [place], [address] Phone: [phone]   Fax:  [phone]  Physical Therapy Treatment  Patient Details  Name: [name] MRN: [id number] Date of Birth: 07-Feb-1927 Referring Provider: Reesa Chew, PA-C   Encounter Date: 01/05/2018  PT End of Session - 01/05/18 1527    Visit Number  5    Number of Visits  12    Date for PT Re-Evaluation  01/27/18    Authorization Type  Medicare / BCBS    PT Start Time  1527    PT Stop Time  1614    PT Time Calculation (min)  47 min    Activity Tolerance  Patient tolerated treatment well    Behavior During Therapy  [facility] for tasks assessed/performed       Past Medical History:  Diagnosis Date  . Abdominal aortic aneurysm ([place]) 09/2008   s/o endovascular repair and angiogram 10/04/08   . Anemia    EGD Cscope 08-2012  . Arthritis    "hips" (11/23/2017)  . Atrial fibrillation ([place])   . Bleeding stomach ulcer 1990s  . Bleeding ulcer 1997  . CKD (chronic kidney disease), stage IV (HCC)    Stable, Dr. Dimas Aguas  . COPD (chronic obstructive pulmonary disease) ([place])    "mild" (11/23/2017)  . Dementia    "on very small dose of Aricept" (11/23/2017)  . H/O hyperkalemia    Stable  . Hypertension   . Leukopenia 2/11   thought to be from doxy  . Pneumonia 2018 X 2  . Renal cell carcinoma    clear cell type dx 10/09, s/p excision 10-24-08  . Renal insufficiency   . Solitary left kidney    W/ chronic tubule-interstitial damage, Dr. Dimas Aguas  . Spinal stenosis    causing LE weakness-persistant, being worked up by Hydrographic surveyor and neurologist    Past Surgical History:  Procedure Laterality Date  . ABDOMINAL AORTIC ANEURYSM REPAIR  2009   EVAR  . CATARACT EXTRACTION W/ INTRAOCULAR LENS  IMPLANT, BILATERAL Bilateral   . HEMORRHOID SURGERY  1990  . IR ANGIOGRAM PELVIS SELECTIVE OR SUPRASELECTIVE  11/24/2017  . IR ANGIOGRAM SELECTIVE EACH  ADDITIONAL VESSEL  11/24/2017  . IR ANGIOGRAM SELECTIVE EACH ADDITIONAL VESSEL  11/24/2017  . IR ANGIOGRAM SELECTIVE EACH ADDITIONAL VESSEL  11/24/2017  . IR ANGIOGRAM SELECTIVE EACH ADDITIONAL VESSEL  11/24/2017  . IR ANGIOGRAM VISCERAL SELECTIVE  11/24/2017  . IR ANGIOGRAM VISCERAL SELECTIVE  11/24/2017  . IR RADIOLOGIST EVAL & MGMT  09/07/2017  . IR US GUIDE VASC ACCESS RIGHT  11/24/2017  . [place] ([place]), 2004  . NEPHRECTOMY  2009   Open nephrectomy for cancer    There were no vitals filed for this visit.  Subjective Assessment - 01/05/18 1531    Subjective  Pt doing well, other than "just tired".    Patient Stated Goals  "walking better"    Currently in Pain?  No/denies                      [facility] Adult PT Treatment/Exercise - 01/05/18 1527      Knee/Hip Exercises: Stretches   Passive Hamstring Stretch  Both;30 seconds;1 rep    Passive Hamstring Stretch Limitations  seated with fwd lean    Piriformis Stretch  Both;30 seconds;1 rep    Piriformis Stretch Limitations  seated KTOS      Knee/Hip  Exercises: Aerobic   Nustep  lvl 6 x 6'      Knee/Hip Exercises: Standing   Heel Raises  Both;10 reps;3 seconds    Heel Raises Limitations  & toe raises x10        [facility]) - 01/05/18 1527    PWR! exercises  Moves in standing;Functional moves    PWR! Up  2x10    PWR! Rock  2x10    PWR! Twist  2x10    PWR Step  2x10    PWR! Step Through Forward/Back  x20 combined fwd/back (1 hand support on counter); carryover into fwd/back gait along edge of counter, progressing to unsupported gait       Balance Exercises - 01/05/18 1527      Balance Exercises: Standing   Sidestepping  4 reps along counter    Marching Limitations  4 reps along counter          PT Short Term Goals - 11/21/17 1541      PT SHORT TERM GOAL #1   Title  Independent with initial HEP as indicated    Status  Achieved      PT SHORT TERM GOAL #2   Title  Pt will improve gait  speed to >/= 1.8 ft/sec to reduce fall risk    Status  Achieved        PT Long Term Goals - 12/13/17 1449      PT LONG TERM GOAL #1   Title  Pt will be independent with advanced HEP as indicated    Status  On-going      PT LONG TERM GOAL #2   Title  B LE strength to >/= 4/5 for improved function     Status  On-going      PT LONG TERM GOAL #3   Title  Pt will demonstrate improvement on the Berg Balance Scale to 45/56 or greater to reduce risk for falls     Status  On-going      PT LONG TERM GOAL #4   Title  Pt will improve gait speed to 2.6 ft/sec or greater for improved safety with community ambulation     Status  On-going      PT LONG TERM GOAL #5   Title  Pt will complete TUG in </= 15 sec to improve safety with transitions    Status  On-going            Plan - 01/05/18 1534    Clinical Impression Statement  Pt and dtr wishing to wrap up PT with next week in order to preserve Medicare PT funds for potential use later in the year as needed, therefore initiated HEP review targeting activities pt reports he has been wokring on more commonly at home as well as functional move based activities such as PWR! Moves. Pt demonstrating good recall of exercises with only minor clarifications necessary. Will plan to complete HEP review on next visit.    Rehab Potential  Good    Clinical Impairments Affecting Rehab Potential  Advanced age, Parkinsonism, OA of mutiple joints, lumbar stenosis, LE neuropathy, HTN, COPD, CKD, recent hospitalization for IR repair of AAA endoleak complicated by acute resp failure & PNA requiring inpt rehab stay    PT Treatment/Interventions  Patient/family education;Neuromuscular re-education;Balance training;Therapeutic exercise;Therapeutic activities;Functional mobility training;Gait training;Manual techniques;Taping;Dry needling;Electrical Stimulation;Moist Heat;Cryotherapy;ADLs/Self Care Home Management    Consulted and Agree with Plan of Care  Patient        Patient will benefit  from skilled therapeutic intervention in order to improve the following deficits and impairments:  Abnormal gait, Difficulty walking, Decreased activity tolerance, Decreased balance, Decreased coordination, Decreased safety awareness, Decreased strength, Impaired flexibility, Decreased range of motion, Postural dysfunction, Improper body mechanics, Pain, Impaired sensation  Visit Diagnosis: Other abnormalities of gait and mobility  Unsteadiness on feet  Difficulty in walking, not elsewhere classified  Muscle weakness (generalized)  Other symptoms and signs involving the musculoskeletal system  Other symptoms and signs involving the nervous system  Abnormal posture     Problem List Patient Active Problem List   Diagnosis Date Noted  . Recurrent pneumonia 12/29/2017  . Spinal stenosis   . Solitary left kidney   . Renal insufficiency   . H/O hyperkalemia   . Dementia   . Bleeding stomach ulcer   . Atrial fibrillation ([place])   . Arthritis   . Benign prostatic hyperplasia with nocturia 12/04/2017  . Hypoalbuminemia due to protein-calorie malnutrition ([place])   . Thrombocytopenia ([place])   . Chronic kidney disease (CKD), stage IV (severe) ([place])   . Debility 11/29/2017  . Parkinsonism ([place]) 07/10/2017  . Status post dilatation of esophageal stricture 05/15/2017  . Pancytopenia ([place]) 03/31/2016  . Follow-up --- PCP NOTES 09/02/2015  . Neuropathy 06/10/2015  . Pedal edema 01/30/2015  . Hip pain, chronic 01/30/2015  . Cramps of lower extremity 01/30/2015  . Annual physical exam 10/19/2011  . DJD (degenerative joint disease) 12/14/2010  . Leukopenia 01/27/2010  . Anemia 06/06/2009  . COPD (chronic obstructive pulmonary disease) ([place]) 06/06/2009  . RENAL CELL CANCER 12/25/2008  . HTN (hypertension) 12/25/2008  . AAA (abdominal aortic aneurysm) without rupture ([place]) 12/25/2008  . CKD (chronic kidney disease) 12/25/2008  . HYPERCHOLESTEROLEMIA 03/01/2008   . GERD 03/01/2008  . ACNE ROSACEA 11/29/2007  . DIVERTICULOSIS, COLON 04/12/2007    Percival Spanish, PT, MPT 01/05/2018, 5:02 PM  [facility] [address]  [place], [place], [address] Phone: [phone]   Fax:  [phone]  Name: [name] MRN: [id number] Date of Birth: 10/09/1927

## 2018-01-09 ENCOUNTER — Ambulatory Visit: Payer: Medicare Other | Admitting: Physical Therapy

## 2018-01-09 ENCOUNTER — Encounter: Payer: Self-pay | Admitting: Physical Therapy

## 2018-01-09 DIAGNOSIS — R262 Difficulty in walking, not elsewhere classified: Secondary | ICD-10-CM

## 2018-01-09 DIAGNOSIS — R2681 Unsteadiness on feet: Secondary | ICD-10-CM | POA: Diagnosis not present

## 2018-01-09 DIAGNOSIS — R2689 Other abnormalities of gait and mobility: Secondary | ICD-10-CM

## 2018-01-09 DIAGNOSIS — M6281 Muscle weakness (generalized): Secondary | ICD-10-CM

## 2018-01-09 DIAGNOSIS — R29898 Other symptoms and signs involving the musculoskeletal system: Secondary | ICD-10-CM

## 2018-01-09 DIAGNOSIS — R29818 Other symptoms and signs involving the nervous system: Secondary | ICD-10-CM

## 2018-01-09 DIAGNOSIS — R293 Abnormal posture: Secondary | ICD-10-CM

## 2018-01-09 NOTE — Therapy (Signed)
[facility] [address]  [place], [place], [address] Phone: [phone]   Fax:  [phone]  Physical Therapy Treatment  Patient Details  Name: [name] MRN: [id number] Date of Birth: March 23, 1927 Referring Provider: Reesa Chew, PA-C   Encounter Date: 01/09/2018  PT End of Session - 01/09/18 1532    Visit Number  6    Number of Visits  12    Date for PT Re-Evaluation  01/27/18    Authorization Type  Medicare / BCBS    PT Start Time  1532    PT Stop Time  1621    PT Time Calculation (min)  49 min    Activity Tolerance  Patient tolerated treatment well    Behavior During Therapy  [facility] for tasks assessed/performed       Past Medical History:  Diagnosis Date  . Abdominal aortic aneurysm ([place]) 09/2008   s/o endovascular repair and angiogram 10/04/08   . Anemia    EGD Cscope 08-2012  . Arthritis    "hips" (11/23/2017)  . Atrial fibrillation ([place])   . Bleeding stomach ulcer 1990s  . Bleeding ulcer 1997  . CKD (chronic kidney disease), stage IV (HCC)    Stable, Dr. Dimas Aguas  . COPD (chronic obstructive pulmonary disease) ([place])    "mild" (11/23/2017)  . Dementia    "on very small dose of Aricept" (11/23/2017)  . H/O hyperkalemia    Stable  . Hypertension   . Leukopenia 2/11   thought to be from doxy  . Pneumonia 2018 X 2  . Renal cell carcinoma    clear cell type dx 10/09, s/p excision 10-24-08  . Renal insufficiency   . Solitary left kidney    W/ chronic tubule-interstitial damage, Dr. Dimas Aguas  . Spinal stenosis    causing LE weakness-persistant, being worked up by Hydrographic surveyor and neurologist    Past Surgical History:  Procedure Laterality Date  . ABDOMINAL AORTIC ANEURYSM REPAIR  2009   EVAR  . CATARACT EXTRACTION W/ INTRAOCULAR LENS  IMPLANT, BILATERAL Bilateral   . HEMORRHOID SURGERY  1990  . IR ANGIOGRAM PELVIS SELECTIVE OR SUPRASELECTIVE  11/24/2017  . IR ANGIOGRAM SELECTIVE EACH  ADDITIONAL VESSEL  11/24/2017  . IR ANGIOGRAM SELECTIVE EACH ADDITIONAL VESSEL  11/24/2017  . IR ANGIOGRAM SELECTIVE EACH ADDITIONAL VESSEL  11/24/2017  . IR ANGIOGRAM SELECTIVE EACH ADDITIONAL VESSEL  11/24/2017  . IR ANGIOGRAM VISCERAL SELECTIVE  11/24/2017  . IR ANGIOGRAM VISCERAL SELECTIVE  11/24/2017  . IR RADIOLOGIST EVAL & MGMT  09/07/2017  . IR US GUIDE VASC ACCESS RIGHT  11/24/2017  . [place] (lip), 2004  . NEPHRECTOMY  2009   Open nephrectomy for cancer    There were no vitals filed for this visit.  Subjective Assessment - 01/09/18 1532    Subjective  "I'm doing, that's about all I can say." Continues to c/o "just tired".    Patient Stated Goals  "walking better"    Currently in Pain?  No/denies    Pain Score  0-No pain                      [facility] Adult PT Treatment/Exercise - 01/09/18 1532      Exercises   Exercises  Knee/Hip      Lumbar Exercises: Supine   Clam  10 reps;3 seconds    Clam Limitations  alt hip ABD/ER with green TB  Bent Knee Raise  10 reps;3 seconds    Bent Knee Raise Limitations  marching with green TB    Bridge  10 reps;3 seconds    Bridge Limitations  + hip ABD isometric with green TB      Knee/Hip Exercises: Aerobic   Nustep  lvl 6 x 6'      Knee/Hip Exercises: Seated   Ball Squeeze  10x5"    Clamshell with TheraBand  Green 10x3" alt hip ABD/ER     Marching  Both;10 reps;Strengthening    Marching Limitations  looped green TB at knees    Sit to Sand  5 reps;without UE support             PT Education - 01/09/18 1621    Education provided  Yes    Education Details  HEP review/thinning    Person(s) Educated  Patient;Child(ren) dtr [name]    Methods  Explanation;Demonstration;Handout    Comprehension  Verbalized understanding;Returned demonstration;Need further instruction       PT Short Term Goals - 11/21/17 1541      PT SHORT TERM GOAL #1   Title  Independent with initial HEP as indicated     Status  Achieved      PT SHORT TERM GOAL #2   Title  Pt will improve gait speed to >/= 1.8 ft/sec to reduce fall risk    Status  Achieved        PT Long Term Goals - 12/13/17 1449      PT LONG TERM GOAL #1   Title  Pt will be independent with advanced HEP as indicated    Status  On-going      PT LONG TERM GOAL #2   Title  B LE strength to >/= 4/5 for improved function     Status  On-going      PT LONG TERM GOAL #3   Title  Pt will demonstrate improvement on the Berg Balance Scale to 45/56 or greater to reduce risk for falls     Status  On-going      PT LONG TERM GOAL #4   Title  Pt will improve gait speed to 2.6 ft/sec or greater for improved safety with community ambulation     Status  On-going      PT LONG TERM GOAL #5   Title  Pt will complete TUG in </= 15 sec to improve safety with transitions    Status  On-going            Plan - 01/09/18 1621    Clinical Impression Statement  Pt's dtr, [name], brought several folders worth of exercises from current and prior therapy episodes, requesting assistance in narrowing down exercises to most relelvant and appriopriate exercises. Session spent thinning duplicate and no longer relevant exercises, while reviewing more appriopriate exercises with pt and determining most feasible way for pt to perform exercises at home. Pt noting slight preference for seated exercises, therefore majority of exercises for HEP performed in this position. Unable to fully assess/review relevant standing exercises due to time constraints today, therefore will complete this prior discharge assessment on final visit.    Rehab Potential  Good    Clinical Impairments Affecting Rehab Potential  Advanced age, Parkinsonism, OA of mutiple joints, lumbar stenosis, LE neuropathy, HTN, COPD, CKD, recent hospitalization for IR repair of AAA endoleak complicated by acute resp failure & PNA requiring inpt rehab stay    PT Treatment/Interventions  Patient/family  education;Neuromuscular re-education;Balance  training;Therapeutic exercise;Therapeutic activities;Functional mobility training;Gait training;Manual techniques;Taping;Dry needling;Electrical Stimulation;Moist Heat;Cryotherapy;ADLs/Self Care Home Management    Consulted and Agree with Plan of Care  Patient       Patient will benefit from skilled therapeutic intervention in order to improve the following deficits and impairments:  Abnormal gait, Difficulty walking, Decreased activity tolerance, Decreased balance, Decreased coordination, Decreased safety awareness, Decreased strength, Impaired flexibility, Decreased range of motion, Postural dysfunction, Improper body mechanics, Pain, Impaired sensation  Visit Diagnosis: Other abnormalities of gait and mobility  Unsteadiness on feet  Difficulty in walking, not elsewhere classified  Muscle weakness (generalized)  Other symptoms and signs involving the musculoskeletal system  Other symptoms and signs involving the nervous system  Abnormal posture     Problem List Patient Active Problem List   Diagnosis Date Noted  . Recurrent pneumonia 12/29/2017  . Spinal stenosis   . Solitary left kidney   . Renal insufficiency   . H/O hyperkalemia   . Dementia   . Bleeding stomach ulcer   . Atrial fibrillation ([place])   . Arthritis   . Benign prostatic hyperplasia with nocturia 12/04/2017  . Hypoalbuminemia due to protein-calorie malnutrition ([place])   . Thrombocytopenia ([place])   . Chronic kidney disease (CKD), stage IV (severe) ([place])   . Debility 11/29/2017  . Parkinsonism ([place]) 07/10/2017  . Status post dilatation of esophageal stricture 05/15/2017  . Pancytopenia ([place]) 03/31/2016  . Follow-up --- PCP NOTES 09/02/2015  . Neuropathy 06/10/2015  . Pedal edema 01/30/2015  . Hip pain, chronic 01/30/2015  . Cramps of lower extremity 01/30/2015  . Annual physical exam 10/19/2011  . DJD (degenerative joint disease) 12/14/2010  . Leukopenia  01/27/2010  . Anemia 06/06/2009  . COPD (chronic obstructive pulmonary disease) ([place]) 06/06/2009  . RENAL CELL CANCER 12/25/2008  . HTN (hypertension) 12/25/2008  . AAA (abdominal aortic aneurysm) without rupture ([place]) 12/25/2008  . CKD (chronic kidney disease) 12/25/2008  . HYPERCHOLESTEROLEMIA 03/01/2008  . GERD 03/01/2008  . ACNE ROSACEA 11/29/2007  . DIVERTICULOSIS, COLON 04/12/2007    Percival Spanish, PT, MPT 01/09/2018, 7:17 PM  [facility] [address]  [place], [place], [address] Phone: [phone]   Fax:  [phone]  Name: [name] MRN: [id number] Date of Birth: 04/01/1927

## 2018-01-11 ENCOUNTER — Inpatient Hospital Stay: Payer: Medicare Other | Admitting: Physical Medicine & Rehabilitation

## 2018-01-11 NOTE — Telephone Encounter (Signed)
Thanks. jen

## 2018-01-12 ENCOUNTER — Ambulatory Visit: Payer: Medicare Other | Admitting: Physical Therapy

## 2018-01-12 ENCOUNTER — Encounter: Payer: Self-pay | Admitting: Physical Therapy

## 2018-01-12 DIAGNOSIS — R29898 Other symptoms and signs involving the musculoskeletal system: Secondary | ICD-10-CM | POA: Diagnosis not present

## 2018-01-12 DIAGNOSIS — R293 Abnormal posture: Secondary | ICD-10-CM

## 2018-01-12 DIAGNOSIS — R262 Difficulty in walking, not elsewhere classified: Secondary | ICD-10-CM

## 2018-01-12 DIAGNOSIS — R2681 Unsteadiness on feet: Secondary | ICD-10-CM | POA: Diagnosis not present

## 2018-01-12 DIAGNOSIS — M6281 Muscle weakness (generalized): Secondary | ICD-10-CM | POA: Diagnosis not present

## 2018-01-12 DIAGNOSIS — R29818 Other symptoms and signs involving the nervous system: Secondary | ICD-10-CM | POA: Diagnosis not present

## 2018-01-12 DIAGNOSIS — R2689 Other abnormalities of gait and mobility: Secondary | ICD-10-CM

## 2018-01-12 NOTE — Therapy (Addendum)
[facility] [address]  [place], [place], [address] Phone: [phone]   Fax:  [phone]  Physical Therapy Treatment  Patient Details  Name: [name] MRN: [id number] Date of Birth: 07/25/1927 Referring Provider: Reesa Chew, PA-C   Encounter Date: 01/12/2018  PT End of Session - 01/12/18 1528    Visit Number  7    Number of Visits  12    Date for PT Re-Evaluation  01/27/18    Authorization Type  Medicare / BCBS    PT Start Time  1528    PT Stop Time  1625    PT Time Calculation (min)  57 min    Activity Tolerance  Patient tolerated treatment well    Behavior During Therapy  [facility] for tasks assessed/performed       Past Medical History:  Diagnosis Date  . Abdominal aortic aneurysm ([place]) 09/2008   s/o endovascular repair and angiogram 10/04/08   . Anemia    EGD Cscope 08-2012  . Arthritis    "hips" (11/23/2017)  . Atrial fibrillation ([place])   . Bleeding stomach ulcer 1990s  . Bleeding ulcer 1997  . CKD (chronic kidney disease), stage IV (HCC)    Stable, Dr. Dimas Aguas  . COPD (chronic obstructive pulmonary disease) ([place])    "mild" (11/23/2017)  . Dementia    "on very small dose of Aricept" (11/23/2017)  . H/O hyperkalemia    Stable  . Hypertension   . Leukopenia 2/11   thought to be from doxy  . Pneumonia 2018 X 2  . Renal cell carcinoma    clear cell type dx 10/09, s/p excision 10-24-08  . Renal insufficiency   . Solitary left kidney    W/ chronic tubule-interstitial damage, Dr. Dimas Aguas  . Spinal stenosis    causing LE weakness-persistant, being worked up by Hydrographic surveyor and neurologist    Past Surgical History:  Procedure Laterality Date  . ABDOMINAL AORTIC ANEURYSM REPAIR  2009   EVAR  . CATARACT EXTRACTION W/ INTRAOCULAR LENS  IMPLANT, BILATERAL Bilateral   . HEMORRHOID SURGERY  1990  . IR ANGIOGRAM PELVIS SELECTIVE OR SUPRASELECTIVE  11/24/2017  . IR ANGIOGRAM SELECTIVE EACH  ADDITIONAL VESSEL  11/24/2017  . IR ANGIOGRAM SELECTIVE EACH ADDITIONAL VESSEL  11/24/2017  . IR ANGIOGRAM SELECTIVE EACH ADDITIONAL VESSEL  11/24/2017  . IR ANGIOGRAM SELECTIVE EACH ADDITIONAL VESSEL  11/24/2017  . IR ANGIOGRAM VISCERAL SELECTIVE  11/24/2017  . IR ANGIOGRAM VISCERAL SELECTIVE  11/24/2017  . IR RADIOLOGIST EVAL & MGMT  09/07/2017  . IR US GUIDE VASC ACCESS RIGHT  11/24/2017  . [place] (lip), 2004  . NEPHRECTOMY  2009   Open nephrectomy for cancer    There were no vitals filed for this visit.  Subjective Assessment - 01/12/18 1535    Subjective  Pt and dtr vrebalizing readiness to transition to HEP as planned today.    Patient Stated Goals  "walking better"    Currently in Pain?  No/denies    Pain Score  0-No pain         [facility] PT Assessment - 01/12/18 1528      Assessment   Medical Diagnosis  Debility, resp failure/PNA    Referring Provider  Reesa Chew, PA-C    Onset Date/Surgical Date  11/23/17    Next MD Visit  07/05/18 with Kathlene November, MD (PCP)       Strength   Overall  Strength Comments  tested in sitting    Right Hip Flexion  4-/5    Right Hip Extension  4/5    Right Hip External Rotation   4/5    Right Hip Internal Rotation  4/5    Right Hip ABduction  4/5    Right Hip ADduction  4-/5    Left Hip Flexion  4-/5    Left Hip Extension  4/5    Left Hip External Rotation  4/5    Left Hip Internal Rotation  4/5    Left Hip ABduction  4/5    Left Hip ADduction  4-/5    Right Knee Flexion  4+/5    Right Knee Extension  4+/5    Left Knee Flexion  4+/5    Left Knee Extension  4+/5    Right Ankle Dorsiflexion  4/5    Left Ankle Dorsiflexion  4/5      Ambulation/Gait   Ambulation/Gait Assistance  6: Modified independent (Device/Increase time)    Assistive device  None    Gait Pattern  Step-through pattern;Trunk flexed    Ambulation Surface  Level;Indoor    Gait velocity  2.22 ft/sec w/o AD      Standardized Balance Assessment    Standardized Balance Assessment  Berg Balance Test;10 meter walk test;Timed Up and Go Test    10 Meter Walk  14.75      Berg Balance Test   Sit to Stand  Able to stand without using hands and stabilize independently    Standing Unsupported  Able to stand safely 2 minutes    Sitting with Back Unsupported but Feet Supported on Floor or Stool  Able to sit safely and securely 2 minutes    Stand to Sit  Sits safely with minimal use of hands    Transfers  Able to transfer safely, minor use of hands    Standing Unsupported with Eyes Closed  Able to stand 10 seconds with supervision    Standing Ubsupported with Feet Together  Able to place feet together independently and stand 1 minute safely    From Standing, Reach Forward with Outstretched Arm  Can reach forward >5 cm safely (2")    From Standing Position, Pick up Object from [place] to pick up shoe safely and easily    From Standing Position, Turn to Look Behind Over each Shoulder  Turn sideways only but maintains balance    Turn 360 Degrees  Able to turn 360 degrees safely but slowly    Standing Unsupported, Alternately Place Feet on Step/Stool  Able to stand independently and safely and complete 8 steps in 20 seconds    Standing Unsupported, One Foot in Front  Able to take small step independently and hold 30 seconds    Standing on One Leg  Tries to lift leg/unable to hold 3 seconds but remains standing independently    Total Score  44      Timed Up and Go Test   Normal TUG (seconds)  15.85                  [facility] Adult PT Treatment/Exercise - 01/12/18 1528      Exercises   Exercises  Knee/Hip      Knee/Hip Exercises: Aerobic   Nustep  lvl 6 x 6'      Knee/Hip Exercises: Standing   Heel Raises  Both;10 reps;3 seconds    Hip Flexion  Both;10 reps;Knee bent    Hip Flexion  Limitations  high knee marches with single UE support    Hip Abduction  Both;10 reps;Knee straight    Hip Extension  Both;10 reps;Knee straight       Shoulder Exercises: Standing   Horizontal ABduction  Both;10 reps;Theraband;Strengthening    Theraband Level (Shoulder Horizontal ABduction)  Level 1 (Yellow)    Horizontal ABduction Limitations  standing against pool noodle on wall for postural cueing    Flexion  Both;10 reps;Theraband;Strengthening    Theraband Level (Shoulder Flexion)  Level 1 (Yellow)    Flexion Limitations  + opp shoulder extension, standing against pool noodle on wall for postural cueing    Extension  Both;10 reps;Theraband;Strengthening    Theraband Level (Shoulder Extension)  Level 3 (Green)    Extension Limitations  cues for upright posture & scap retraction    Row  Both;10 reps;Theraband;Strengthening    Theraband Level (Shoulder Row)  Level 3 (Green)    Row Limitations  cues for upright posture & scap retraction             PT Education - 01/12/18 1625    Education provided  Yes    Education Details  Completion of HEP review & organization of exercises in to daily groupings    Person(s) Educated  Patient;Child(ren) dtr [name]    Methods  Explanation;Demonstration;Handout    Comprehension  Verbalized understanding;Returned demonstration       PT Short Term Goals - 11/21/17 1541      PT SHORT TERM GOAL #1   Title  Independent with initial HEP as indicated    Status  Achieved      PT SHORT TERM GOAL #2   Title  Pt will improve gait speed to >/= 1.8 ft/sec to reduce fall risk    Status  Achieved        PT Long Term Goals - 01/12/18 1625      PT LONG TERM GOAL #1   Title  Pt will be independent with advanced HEP as indicated    Status  Achieved      PT LONG TERM GOAL #2   Title  B LE strength to >/= 4/5 for improved function     Status  Partially Met      PT LONG TERM GOAL #3   Title  Pt will demonstrate improvement on the Berg Balance Scale to 45/56 or greater to reduce risk for falls     Status  Not Met nearly met - Berg = 44/56      PT LONG TERM GOAL #4   Title  Pt will improve  gait speed to 2.6 ft/sec or greater for improved safety with community ambulation     Status  Not Met 2.22 ft/sec      PT LONG TERM GOAL #5   Title  Pt will complete TUG in </= 15 sec to improve safety with transitions    Status  Not Met TUG = 15.85 sec            Plan - 01/12/18 1625    Clinical Impression Statement  Completed HEP review and helped pt and dtr group exercises together to give him different exercises to work on each day for 5-6 days/week. Goal assessment completed with pt meeting HEP goal, partially meeting strength goal, but not quite meeting remaining goals although TUG time <1 sec from goal & Berg 1 point from goal. Pt and dtr still feeling confident with transitioning to HEP at this time, as they would  like to conserve Medicare cap for potential future therapy need later this year. As such pt will be placed on hold for 30 days to allow for option to return if needs arise during transition to HEP.    Rehab Potential  Good    Clinical Impairments Affecting Rehab Potential  Advanced age, Parkinsonism, OA of mutiple joints, lumbar stenosis, LE neuropathy, HTN, COPD, CKD, recent hospitalization for IR repair of AAA endoleak complicated by acute resp failure & PNA requiring inpt rehab stay    PT Treatment/Interventions  Patient/family education;Neuromuscular re-education;Balance training;Therapeutic exercise;Therapeutic activities;Functional mobility training;Gait training;Manual techniques;Taping;Dry needling;Electrical Stimulation;Moist Heat;Cryotherapy;ADLs/Self Care Home Management    Consulted and Agree with Plan of Care  Patient;Family member/caregiver    Family Member Consulted  Son-in-law       Patient will benefit from skilled therapeutic intervention in order to improve the following deficits and impairments:  Abnormal gait, Difficulty walking, Decreased activity tolerance, Decreased balance, Decreased coordination, Decreased safety awareness, Decreased strength,  Impaired flexibility, Decreased range of motion, Postural dysfunction, Improper body mechanics, Pain, Impaired sensation  Visit Diagnosis: Other abnormalities of gait and mobility  Unsteadiness on feet  Difficulty in walking, not elsewhere classified  Muscle weakness (generalized)  Other symptoms and signs involving the musculoskeletal system  Other symptoms and signs involving the nervous system  Abnormal posture     Problem List Patient Active Problem List   Diagnosis Date Noted  . Recurrent pneumonia 12/29/2017  . Spinal stenosis   . Solitary left kidney   . Renal insufficiency   . H/O hyperkalemia   . Dementia   . Bleeding stomach ulcer   . Atrial fibrillation ([place])   . Arthritis   . Benign prostatic hyperplasia with nocturia 12/04/2017  . Hypoalbuminemia due to protein-calorie malnutrition ([place])   . Thrombocytopenia ([place])   . Chronic kidney disease (CKD), stage IV (severe) ([place])   . Debility 11/29/2017  . Parkinsonism ([place]) 07/10/2017  . Status post dilatation of esophageal stricture 05/15/2017  . Pancytopenia ([place]) 03/31/2016  . Follow-up --- PCP NOTES 09/02/2015  . Neuropathy 06/10/2015  . Pedal edema 01/30/2015  . Hip pain, chronic 01/30/2015  . Cramps of lower extremity 01/30/2015  . Annual physical exam 10/19/2011  . DJD (degenerative joint disease) 12/14/2010  . Leukopenia 01/27/2010  . Anemia 06/06/2009  . COPD (chronic obstructive pulmonary disease) ([place]) 06/06/2009  . RENAL CELL CANCER 12/25/2008  . HTN (hypertension) 12/25/2008  . AAA (abdominal aortic aneurysm) without rupture ([place]) 12/25/2008  . CKD (chronic kidney disease) 12/25/2008  . HYPERCHOLESTEROLEMIA 03/01/2008  . GERD 03/01/2008  . ACNE ROSACEA 11/29/2007  . DIVERTICULOSIS, COLON 04/12/2007    Percival Spanish, PT, MPT 01/12/2018, 6:33 PM  [facility] [address]  [place], [place], [address] Phone: [phone]    Fax:  [phone]  Name: EIDAN MUELLNER MRN: [id number] Date of Birth: [birth year]  PHYSICAL THERAPY DISCHARGE SUMMARY  Visits from Start of Care: 7  Current functional level related to goals / functional outcomes:   Refer to above clinical impression for status as of last visit on 01/12/18. Pt was placed on hold for 30 days and has not needed to return to PT, therefore will proceed with discharge from PT for this episode.   Remaining deficits:  As above.   Education / Equipment:   HEP  Plan: Patient agrees to discharge.  Patient goals were partially met. Patient is being discharged due to financial reasons.  ?????  Percival Spanish, PT, MPT 02/23/18, 2:57 PM  [facility] [address]  [place], [place], [address] Phone: [phone]   Fax:  [phone]

## 2018-01-30 ENCOUNTER — Telehealth: Payer: Self-pay | Admitting: Internal Medicine

## 2018-01-30 ENCOUNTER — Ambulatory Visit (INDEPENDENT_AMBULATORY_CARE_PROVIDER_SITE_OTHER): Payer: Medicare Other | Admitting: Internal Medicine

## 2018-01-30 ENCOUNTER — Ambulatory Visit (HOSPITAL_BASED_OUTPATIENT_CLINIC_OR_DEPARTMENT_OTHER)
Admission: RE | Admit: 2018-01-30 | Discharge: 2018-01-30 | Disposition: A | Discharge status: Home / Self Care | Payer: Medicare Other | Source: Ambulatory Visit | Attending: Internal Medicine | Admitting: Internal Medicine

## 2018-01-30 ENCOUNTER — Encounter: Payer: Self-pay | Admitting: Internal Medicine

## 2018-01-30 VITALS — BP 124/80 | HR 71 | Temp 98.1°F | Resp 14 | Ht 68.0 in | Wt 157.2 lb

## 2018-01-30 DIAGNOSIS — Z8701 Personal history of pneumonia (recurrent): Secondary | ICD-10-CM

## 2018-01-30 DIAGNOSIS — N189 Chronic kidney disease, unspecified: Secondary | ICD-10-CM

## 2018-01-30 DIAGNOSIS — R918 Other nonspecific abnormal finding of lung field: Secondary | ICD-10-CM | POA: Insufficient documentation

## 2018-01-30 DIAGNOSIS — R05 Cough: Secondary | ICD-10-CM | POA: Diagnosis not present

## 2018-01-30 DIAGNOSIS — R5383 Other fatigue: Secondary | ICD-10-CM | POA: Diagnosis not present

## 2018-01-30 DIAGNOSIS — I517 Cardiomegaly: Secondary | ICD-10-CM | POA: Diagnosis not present

## 2018-01-30 DIAGNOSIS — R319 Hematuria, unspecified: Secondary | ICD-10-CM | POA: Diagnosis not present

## 2018-01-30 DIAGNOSIS — J90 Pleural effusion, not elsewhere classified: Secondary | ICD-10-CM | POA: Insufficient documentation

## 2018-01-30 NOTE — Progress Notes (Signed)
Subjective:    Patient ID: [name], male    DOB: Feb 03, 1927, 84 y.o.   MRN: [id number]  DOS:  01/30/2018 Type of visit - description : Acute visit, here with his daughter Interval history: [name] is very concerned because his father is weaker than baseline  for the last few days particularly the last 24 hours. That the weakness is generalized, has no energy, needed assistance and a wheelchair to come from the parking lot to the office. Up until 2 days ago he was doing physical therapy at home daily. She is concerned because the symptoms are typically d/t developing  pneumonia or UTI.  Wt Readings from Last 3 Encounters:  01/30/18 157 lb 4 oz (71.3 kg)  12/30/17 153 lb 2 oz (69.5 kg)  12/29/17 152 lb 6.4 oz (69.1 kg)     Review of Systems No fever chills + Chest congestion but very little cough if any. No nausea, vomiting, diarrhea Lower extremity edema at baseline.  No chest pain, palpitations or difficulty breathing No dysuria or gross hematuria   Past Medical History:  Diagnosis Date  . Abdominal aortic aneurysm ([place]) 09/2008   s/o endovascular repair and angiogram 10/04/08   . Anemia    EGD Cscope 08-2012  . Arthritis    "hips" (11/23/2017)  . Atrial fibrillation ([place])   . Bleeding stomach ulcer 1990s  . Bleeding ulcer 1997  . CKD (chronic kidney disease), stage IV (HCC)    Stable, Dr. Dimas Aguas  . COPD (chronic obstructive pulmonary disease) ([place])    "mild" (11/23/2017)  . Dementia    "on very small dose of Aricept" (11/23/2017)  . H/O hyperkalemia    Stable  . Hypertension   . Leukopenia 2/11   thought to be from doxy  . Pneumonia 2018 X 2  . Renal cell carcinoma    clear cell type dx 10/09, s/p excision 10-24-08  . Renal insufficiency   . Solitary left kidney    W/ chronic tubule-interstitial damage, Dr. Dimas Aguas  . Spinal stenosis    causing LE weakness-persistant, being worked up by Hydrographic surveyor and neurologist    Past Surgical History:    Procedure Laterality Date  . ABDOMINAL AORTIC ANEURYSM REPAIR  2009   EVAR  . CATARACT EXTRACTION W/ INTRAOCULAR LENS  IMPLANT, BILATERAL Bilateral   . HEMORRHOID SURGERY  1990  . IR ANGIOGRAM PELVIS SELECTIVE OR SUPRASELECTIVE  11/24/2017  . IR ANGIOGRAM SELECTIVE EACH ADDITIONAL VESSEL  11/24/2017  . IR ANGIOGRAM SELECTIVE EACH ADDITIONAL VESSEL  11/24/2017  . IR ANGIOGRAM SELECTIVE EACH ADDITIONAL VESSEL  11/24/2017  . IR ANGIOGRAM SELECTIVE EACH ADDITIONAL VESSEL  11/24/2017  . IR ANGIOGRAM VISCERAL SELECTIVE  11/24/2017  . IR ANGIOGRAM VISCERAL SELECTIVE  11/24/2017  . IR RADIOLOGIST EVAL & MGMT  09/07/2017  . IR US GUIDE VASC ACCESS RIGHT  11/24/2017  . [place]), 2004  . NEPHRECTOMY  2009   Open nephrectomy for cancer    Social History   Socioeconomic History  . Marital status: Widowed    Spouse name: Not on file  . Number of children: 3  . Years of education: Not on file  . Highest education level: Not on file  Social Needs  . Financial resource strain: Not on file  . Food insecurity - worry: Not on file  . Food insecurity - inability: Not on file  . Transportation needs - medical: Not on file  . Transportation needs - non-medical:  Not on file  Occupational History  . Occupation: retired    Fish farm manager: RETIRED  Tobacco Use  . Smoking status: Former Smoker    Packs/day: 1.50    Years: 60.00    Pack years: 90.00    Last attempt to quit: 12/28/2007    Years since quitting: 10.1  . Smokeless tobacco: Never Used  Substance and Sexual Activity  . Alcohol use: No    Alcohol/week: 0.0 oz  . Drug use: No  . Sexual activity: Not on file  Other Topics Concern  . Not on file  Social History Narrative   Lives by himself    Lost wife to dementia 10-2015   Totally independent on ADL , no driving   Often came to office w/ his daughter [name] [phone]), his other daughter is  [name] , son is [name]                      Allergies as of 01/30/2018       Reactions   Iodinated Diagnostic Agents Other (See Comments)   Only one kidney    Ioxaglate Other (See Comments)   Only one kidney  Only one kidney    Baclofen Other (See Comments)   Unable to walk per Pt's family  Unable to walk per Pt's family  "turns him upside down"       Medication List        Accurate as of 01/30/18 11:59 PM. Always use your most recent med list.          aspirin EC 81 MG tablet Take 81 mg by mouth daily.   Carbidopa-Levodopa ER 25-100 MG tablet controlled release Commonly known as:  SINEMET CR Take 0.5 tablets by mouth 2 (two) times daily.   docusate sodium 100 MG capsule Commonly known as:  COLACE Take 100 mg by mouth daily.   donepezil 5 MG tablet Commonly known as:  ARICEPT Take 5 mg by mouth at bedtime.   doxycycline 100 MG tablet Commonly known as:  VIBRA-TABS Take 1 tablet (100 mg total) by mouth 2 (two) times daily.   hydrALAZINE 25 MG tablet Commonly known as:  APRESOLINE Take 1 tablet (25 mg total) by mouth 3 (three) times daily.   patiromer 8.4 g packet Commonly known as:  VELTASSA Take as directed   prednisoLONE acetate 1 % ophthalmic suspension Commonly known as:  PRED FORTE Place 1 drop into both eyes 2 (two) times daily.   SE-TAN PLUS 162-115.2-1 MG Caps Take 1 capsule by mouth daily. Take 1 Tab  daily   tamsulosin 0.4 MG Caps capsule Commonly known as:  FLOMAX Take 1 capsule (0.4 mg total) by mouth daily after supper.   Vitamin D 2000 units Caps Take 2,000 Units by mouth daily.          Objective:   Physical Exam BP 124/80 (BP Location: Left Arm, Patient Position: Sitting, Cuff Size: Small)   Pulse 71   Temp 98.1 F (36.7 C) (Oral)   Resp 14   Ht 5\' 8"  (1.727 m)   Wt 157 lb 4 oz (71.3 kg)   SpO2 94%   BMI 23.91 kg/m  General:   Well developed, well nourished.  Looks tired but in no distress. HEENT:  Normocephalic . Face symmetric, atraumatic. Left TM normal, right TM obscured by cerumen Throat  symmetric and not red.  Nose not congested. Lungs:  No breath sounds right base, otherwise mildly decreased breath sounds throughout .  Normal respiratory effort, no intercostal retractions, no accessory muscle use. Heart: Regular?,  no murmur.  +/+++ pretibial edema bilaterally  Abdomen:  Not distended, soft, non-tender exam performed while the patient was sitting in a wheelchair. Skin: Not pale. Not jaundice Neurologic:  alert & oriented X3.  Speech normal, gait not tested Psych--  Cognition and judgment appear intact.  Cooperative with normal attention span and concentration.  Behavior appropriate bones well to questions, smiles. No anxious or depressed appearing.     Assessment & Plan:   Assessment  Hypertension, COPD--former smoker,PFTs 8- 2013 mild obstruction otherwise normal. Asx  Paroxysmal A. fib DX 11/2017 Renal: (f/u @ BMI) --H/o Renal Cancer  --Solitary left kidney --Chronic renal insufficiency --hematuria: Dr Hazle Nordmann, cysto 01-2017 Neuro:   -on aricept per neuro, mild memory imparment -parkinson's   -neuropathy, saw neuro 06-2015, Dr Posey Pronto. As off 03-2016 is [facility] at [facility]:  +[name] gen neuro; +Dr Tonye Royalty now  Dr Hall Busing, movement d/o specialist   MSK: --DJD --Mild spinal stenosis, has seen  Dr Flavia Shipper Osteopenia--DEXA 07-2015, osteopenia, rx vit D Pancytopenia: sees hem-onc (Dr Cruzita Lederer, [facility]) , 2011 felt to be from doxycycline. Last OV 09-13-2017 AAA s/p EVAR 2009 Acne rosacea Admitted 11-2017: Angiography, complicated by a respiratory failure, new onset of P- A. fib.   Plan:  PNM?  Presents with fatigue for several days and some chest congestion, historically  this sx usually indicate pneumonia or a UTI, he has decrease right base sounds, ? pneumonia.   Other etiologies for fatigue include - CHF, he has gained a few pounds but edema is at baseline -P-A Fib but rate is very good.  Rec  chest x-ray, UA, urine culture and CBC. Patient's daughter is  quite concerned about sxs and wonders if a CT is indicated if CXR (-)  b/c previously a PNM  did not show in a chest x-ray. Also wonders about empiric abx.  We will keep that in mind.  Further advise with results. Fatigue: See above Chronic renal disease: Nephrology requested BMP and uric acid, he is we are checking his blood will do those test and fax them to renal.

## 2018-01-30 NOTE — Patient Instructions (Signed)
GO TO THE LAB : Get the blood work    STOP BY THE FIRST FLOOR:  get the XR   Good hydration, drink plenty fluids  Call if symptoms increase

## 2018-01-30 NOTE — Telephone Encounter (Signed)
Copied from [place] [id number]. Topic: Quick Communication - See Telephone Encounter >> Jan 30, 2018  4:38 PM Percell Belt A wrote: CRM for notification. See Telephone encounter for: pt daughter called in and is hoping to get results today.  She feels that pt is getting weaker and would like to know what she needs to do with him today?  Best number [phone]  01/30/18.

## 2018-01-30 NOTE — Progress Notes (Signed)
Pre visit review using our clinic review tool, if applicable. No additional management support is needed unless otherwise documented below in the visit note. 

## 2018-01-30 NOTE — Telephone Encounter (Signed)
Results not back yet

## 2018-01-31 LAB — BASIC METABOLIC PANEL
BUN: 27 mg/dL — ABNORMAL HIGH (ref 6–23)
CHLORIDE: 106 meq/L (ref 96–112)
CO2: 29 mEq/L (ref 19–32)
Calcium: 8.6 mg/dL (ref 8.4–10.5)
Creatinine, Ser: 2.26 mg/dL — ABNORMAL HIGH (ref 0.40–1.50)
GFR: 29.19 mL/min — ABNORMAL LOW (ref >60.00)
GLUCOSE: 105 mg/dL — AB (ref 70–99)
POTASSIUM: 3.9 meq/L (ref 3.5–5.1)
Sodium: 144 mEq/L (ref 135–145)

## 2018-01-31 LAB — URINALYSIS, ROUTINE W REFLEX MICROSCOPIC
BILIRUBIN URINE: NEGATIVE
Ketones, ur: NEGATIVE
Leukocytes, UA: NEGATIVE
NITRITE: NEGATIVE
Specific Gravity, Urine: 1.02 (ref 1.000–1.030)
TOTAL PROTEIN, URINE-UPE24: 100 — AB
URINE GLUCOSE: NEGATIVE
Urobilinogen, UA: 0.2 (ref 0.0–1.0)
pH: 6 (ref 5.0–8.0)

## 2018-01-31 LAB — CBC WITH DIFFERENTIAL/PLATELET
BASOS PCT: 1.1 % (ref 0.0–3.0)
Basophils Absolute: 0 10*3/uL (ref 0.0–0.1)
EOS PCT: 2.8 % (ref 0.0–5.0)
Eosinophils Absolute: 0.1 10*3/uL (ref 0.0–0.7)
HCT: 32.1 % — ABNORMAL LOW (ref 39.0–52.0)
Hemoglobin: 10.7 g/dL — ABNORMAL LOW (ref 13.0–17.0)
LYMPHS ABS: 0.7 10*3/uL (ref 0.7–4.0)
Lymphocytes Relative: 22.5 % (ref 12.0–46.0)
MCHC: 33.2 g/dL (ref 30.0–36.0)
MCV: 94.9 fl (ref 78.0–100.0)
MONO ABS: 0.3 10*3/uL (ref 0.1–1.0)
Monocytes Relative: 10.6 % (ref 3.0–12.0)
NEUTROS ABS: 1.9 10*3/uL (ref 1.4–7.7)
NEUTROS PCT: 63 % (ref 43.0–77.0)
Platelets: 88 10*3/uL — ABNORMAL LOW (ref 150.0–400.0)
RBC: 3.39 Mil/uL — ABNORMAL LOW (ref 4.22–5.81)
RDW: 16.7 % — AB (ref 11.5–15.5)
WBC: 3 10*3/uL — ABNORMAL LOW (ref 4.0–10.5)

## 2018-01-31 LAB — URINE CULTURE
MICRO NUMBER:: 90147355
SPECIMEN QUALITY:: ADEQUATE

## 2018-01-31 LAB — URIC ACID: URIC ACID, SERUM: 7.1 mg/dL (ref 4.0–7.8)

## 2018-01-31 MED ORDER — DOXYCYCLINE HYCLATE 100 MG PO TABS: 100.0000 mg | ORAL_TABLET | Freq: Two times a day (BID) | ORAL | 0 refills | Status: DC

## 2018-01-31 NOTE — Telephone Encounter (Signed)
[name] calling back about a treatment plan. She is wanting to know if the labs and chest xray is back. Please call back [name] [phone].

## 2018-01-31 NOTE — Telephone Encounter (Signed)
Please advise. Labs and x-ray back.

## 2018-01-31 NOTE — Telephone Encounter (Signed)
Notes recorded by Damita Dunnings, [place] on 01/31/2018 at 11:20 AM EST LMOM informing [name] [name] of x-ray and lab results and recommendations. Instructed to call office and schedule follow-up visit for 3 weeks and to let us know if questions/concerns. Faxed results to nephrology. ------  Notes recorded by Colon Branch, MD on 01/31/2018 at 10:45 AM EST On exam, breath sounds were decreased at the right base, that coincide with chronic density on chest x-ray.  Question of new infiltrate. Advised patient's daughter: Question of pneumonia on chest x-ray. Labs were stable Recommend a trial with antibiotics. Doxycycline 100 mg 1 p.o. twice daily #14 no refills. Follow-up here in 3 weeks, call if not gradually improving. Forward results to nephrology.

## 2018-01-31 NOTE — Assessment & Plan Note (Signed)
PNM?  Presents with fatigue for several days and some chest congestion, historically  this sx usually indicate pneumonia or a UTI, he has decrease right base sounds, ? pneumonia.   Other etiologies for fatigue include - CHF, he has gained a few pounds but edema is at baseline -P-A Fib but rate is very good.  Rec  chest x-ray, UA, urine culture and CBC. Patient's daughter is quite concerned about sxs and wonders if a CT is indicated if CXR (-)  b/c previously a PNM  did not show in a chest x-ray. Also wonders about empiric abx.  We will keep that in mind.  Further advise with results. Fatigue: See above Chronic renal disease: Nephrology requested BMP and uric acid, he is we are checking his blood will do those test and fax them to renal.

## 2018-02-02 DIAGNOSIS — N390 Urinary tract infection, site not specified: Secondary | ICD-10-CM | POA: Diagnosis not present

## 2018-02-02 DIAGNOSIS — I10 Essential (primary) hypertension: Secondary | ICD-10-CM | POA: Diagnosis not present

## 2018-02-02 DIAGNOSIS — N184 Chronic kidney disease, stage 4 (severe): Secondary | ICD-10-CM | POA: Diagnosis not present

## 2018-02-03 ENCOUNTER — Telehealth: Payer: Self-pay | Admitting: Internal Medicine

## 2018-02-03 MED ORDER — CEFUROXIME AXETIL 500 MG PO TABS: 500.0000 mg | ORAL_TABLET | Freq: Two times a day (BID) | ORAL | 0 refills | Status: DC

## 2018-02-03 NOTE — Telephone Encounter (Signed)
I tried to call [name], "not available". Please try to call her again. We are treating presumed pneumonia with doxycycline, he started the treatment 4 days ago. I think is too early to say he is not responding to antibiotics. If he has severe cough,: Go to the urgent care / ER  If needed during the weekend, otherwise we could see him early next week. [name]

## 2018-02-03 NOTE — Telephone Encounter (Signed)
Copied from [place]. Topic: Quick Communication - See Telephone Encounter >> Feb 03, 2018  3:41 PM Synthia Innocent wrote: CRM for notification. See Telephone encounter for: No improvement with doxycycline (VIBRA-TABS) 100 MG tablet, kidney dr prescribed torsemide 100mg . Patient is still very weak. Does he need to have something else called in? Please advise  02/03/18.

## 2018-02-03 NOTE — Telephone Encounter (Signed)
Ok, to be sure we can ADD ceftin, please d/w pt's daughter

## 2018-02-03 NOTE — Telephone Encounter (Signed)
Please advise 

## 2018-02-03 NOTE — Telephone Encounter (Signed)
Left detailed message on daughters voicemail.

## 2018-02-03 NOTE — Telephone Encounter (Signed)
Spoke with [name] his daughter She stated she just does not want him to end up in the ER bc he has had this before and that was the end result. I did advise her to continue with the antibiotic fully she agreed  She also set appointment for Monday at 8:20am she stated if he gets better over the weekend she will call Monday morning. Hopefully no charge if she has to cancel.

## 2018-02-06 ENCOUNTER — Encounter: Payer: Self-pay | Admitting: Internal Medicine

## 2018-02-06 ENCOUNTER — Ambulatory Visit (INDEPENDENT_AMBULATORY_CARE_PROVIDER_SITE_OTHER): Payer: Medicare Other | Admitting: Internal Medicine

## 2018-02-06 VITALS — BP 128/68 | HR 80 | Temp 98.3°F | Resp 14 | Ht 68.0 in | Wt 157.0 lb

## 2018-02-06 DIAGNOSIS — I480 Paroxysmal atrial fibrillation: Secondary | ICD-10-CM

## 2018-02-06 DIAGNOSIS — R29898 Other symptoms and signs involving the musculoskeletal system: Secondary | ICD-10-CM

## 2018-02-06 DIAGNOSIS — I509 Heart failure, unspecified: Secondary | ICD-10-CM | POA: Diagnosis not present

## 2018-02-06 DIAGNOSIS — R5383 Other fatigue: Secondary | ICD-10-CM | POA: Diagnosis not present

## 2018-02-06 DIAGNOSIS — N184 Chronic kidney disease, stage 4 (severe): Secondary | ICD-10-CM

## 2018-02-06 LAB — BASIC METABOLIC PANEL
BUN: 32 mg/dL — ABNORMAL HIGH (ref 6–23)
CALCIUM: 8.7 mg/dL (ref 8.4–10.5)
CO2: 33 mEq/L — ABNORMAL HIGH (ref 19–32)
CREATININE: 2.5 mg/dL — AB (ref 0.40–1.50)
Chloride: 103 mEq/L (ref 96–112)
GFR: 25.98 mL/min — AB (ref >60.00)
GLUCOSE: 97 mg/dL (ref 70–99)
Potassium: 4.6 mEq/L (ref 3.5–5.1)
Sodium: 145 mEq/L (ref 135–145)

## 2018-02-06 LAB — SEDIMENTATION RATE: SED RATE: 1 mm/h (ref 0–20)

## 2018-02-06 LAB — URIC ACID: Uric Acid, Serum: 8.2 mg/dL — ABNORMAL HIGH (ref 4.0–7.8)

## 2018-02-06 NOTE — Patient Instructions (Signed)
GO TO THE LAB : Get the blood work     GO TO THE FRONT DESK Schedule your next appointment for a checkup in 1 month

## 2018-02-06 NOTE — Progress Notes (Signed)
Subjective:    Patient ID: [name], male    DOB: [birth year], 84 y.o.   MRN: [id number]  DOS:  02/06/2018 Type of visit - description : Follow-up Interval history: See previous visit, he was complaining of generalized weakness, I again asked him to describe his symptoms and he feels like his arms and legs feel heavy or like "cotton" when he likes to do anything like carrying bottle of milk or walking. He is not very clear when I asked him if he has actual myalgias. No generalized arthralgias. Workup showed a (-)  urine culture, chest x-ray with questionable infiltrate, he was treated with doxycycline initially and then Ceftin was added, he is not improving as far as feeling weak .  He also saw his nephrologist few days ago, question of CHF on exam , was Rx torsemide and he has a follow-up tomorrow.  On further questioning, this weakness is not really "new" he has that on and off    Wt Readings from Last 3 Encounters:  02/06/18 157 lb (71.2 kg)  01/30/18 157 lb 4 oz (71.3 kg)  12/30/17 153 lb 2 oz (69.5 kg)    Review of Systems No fever chills No shortness of breath Appetite is okay, no depression No cough No headache or visual disturbances.   Past Medical History:  Diagnosis Date  . Abdominal aortic aneurysm ([place]) 09/2008   s/o endovascular repair and angiogram 10/04/08   . Anemia    EGD Cscope 08-2012  . Arthritis    "hips" (11/23/2017)  . Atrial fibrillation ([place])   . Bleeding stomach ulcer 1990s  . Bleeding ulcer 1997  . CKD (chronic kidney disease), stage IV (HCC)    Stable, Dr. Dimas Aguas  . COPD (chronic obstructive pulmonary disease) ([place])    "mild" (11/23/2017)  . Dementia    "on very small dose of Aricept" (11/23/2017)  . H/O hyperkalemia    Stable  . Hypertension   . Leukopenia 2/11   thought to be from doxy  . Pneumonia 2018 X 2  . Renal cell carcinoma    clear cell type dx 10/09, s/p excision 10-24-08  . Renal insufficiency   . Solitary left  kidney    W/ chronic tubule-interstitial damage, Dr. Dimas Aguas  . Spinal stenosis    causing LE weakness-persistant, being worked up by Hydrographic surveyor and neurologist    Past Surgical History:  Procedure Laterality Date  . ABDOMINAL AORTIC ANEURYSM REPAIR  2009   EVAR  . CATARACT EXTRACTION W/ INTRAOCULAR LENS  IMPLANT, BILATERAL Bilateral   . HEMORRHOID SURGERY  1990  . IR ANGIOGRAM PELVIS SELECTIVE OR SUPRASELECTIVE  11/24/2017  . IR ANGIOGRAM SELECTIVE EACH ADDITIONAL VESSEL  11/24/2017  . IR ANGIOGRAM SELECTIVE EACH ADDITIONAL VESSEL  11/24/2017  . IR ANGIOGRAM SELECTIVE EACH ADDITIONAL VESSEL  11/24/2017  . IR ANGIOGRAM SELECTIVE EACH ADDITIONAL VESSEL  11/24/2017  . IR ANGIOGRAM VISCERAL SELECTIVE  11/24/2017  . IR ANGIOGRAM VISCERAL SELECTIVE  11/24/2017  . IR RADIOLOGIST EVAL & MGMT  09/07/2017  . IR US GUIDE VASC ACCESS RIGHT  11/24/2017  . [place] ([place]), 2004  . NEPHRECTOMY  2009   Open nephrectomy for cancer    Social History   Socioeconomic History  . Marital status: Widowed    Spouse name: Not on file  . Number of children: 3  . Years of education: Not on file  . Highest education level: Not on file  Social Needs  .  Financial resource strain: Not on file  . Food insecurity - worry: Not on file  . Food insecurity - inability: Not on file  . Transportation needs - medical: Not on file  . Transportation needs - non-medical: Not on file  Occupational History  . Occupation: retired    Fish farm manager: RETIRED  Tobacco Use  . Smoking status: Former Smoker    Packs/day: 1.50    Years: 60.00    Pack years: 90.00    Last attempt to quit: 12/28/2007    Years since quitting: 10.1  . Smokeless tobacco: Never Used  Substance and Sexual Activity  . Alcohol use: No    Alcohol/week: 0.0 oz  . Drug use: No  . Sexual activity: Not on file  Other Topics Concern  . Not on file  Social History Narrative   Lives by himself    Lost wife to dementia 10-2015    Totally independent on ADL , no driving   Often came to office w/ his daughter [name] [phone]), his other daughter is  [name] , son is Amr                      Allergies as of 02/06/2018      Reactions   Iodinated Diagnostic Agents Other (See Comments)   Only one kidney    Ioxaglate Other (See Comments)   Only one kidney  Only one kidney    Baclofen Other (See Comments)   Unable to walk per Pt's family  Unable to walk per Pt's family  "turns him upside down"       Medication List        Accurate as of 02/06/18  5:52 PM. Always use your most recent med list.          aspirin EC 81 MG tablet Take 81 mg by mouth daily.   Carbidopa-Levodopa ER 25-100 MG tablet controlled release Commonly known as:  SINEMET CR Take 0.5 tablets by mouth 2 (two) times daily.   cefUROXime 500 MG tablet Commonly known as:  CEFTIN Take 1 tablet (500 mg total) by mouth 2 (two) times daily with a meal.   DEMADEX 20 MG tablet Generic drug:  torsemide Take 20 mg by mouth daily.   docusate sodium 100 MG capsule Commonly known as:  COLACE Take 100 mg by mouth daily.   donepezil 5 MG tablet Commonly known as:  ARICEPT Take 5 mg by mouth at bedtime.   doxycycline 100 MG tablet Commonly known as:  VIBRA-TABS Take 1 tablet (100 mg total) by mouth 2 (two) times daily.   hydrALAZINE 25 MG tablet Commonly known as:  APRESOLINE Take 1 tablet (25 mg total) by mouth 3 (three) times daily.   patiromer 8.4 g packet Commonly known as:  VELTASSA Take as directed   prednisoLONE acetate 1 % ophthalmic suspension Commonly known as:  PRED FORTE Place 1 drop into both eyes 2 (two) times daily.   SE-TAN PLUS 162-115.2-1 MG Caps Take 1 capsule by mouth daily. Take 1 Tab  daily   tamsulosin 0.4 MG Caps capsule Commonly known as:  FLOMAX Take 1 capsule (0.4 mg total) by mouth daily after supper.   Vitamin D 2000 units Caps Take 2,000 Units by mouth daily.          Objective:   Physical  Exam BP 128/68 (BP Location: Left Arm, Patient Position: Sitting, Cuff Size: Small)   Pulse 80   Temp 98.3 F (36.8 C) (Oral)  Resp 14   Ht 5\' 8"  (1.727 m)   Wt 157 lb (71.2 kg)   SpO2 92%   BMI 23.87 kg/m  General:   Well developed, well nourished . NAD.  HEENT:  Normocephalic . Face symmetric, atraumatic Lungs:  Today, lungs are clear Normal respiratory effort, no intercostal retractions, no accessory muscle use. Heart: Regular?  No murmur.  +/+++ pretibial edema bilaterally  Skin: Not pale. Not jaundice MSK: Note TTP upon palpation of muscle mass. Neurologic:  alert & oriented X3.  Speech normal, gait: Was able to walk from his  car to the office entrance however then he require a wheelchair due to fatigue.  He needed help transfering  from the chair to the exam table. Motor is symmetric, strength is weak throughout. Psych--  Cognition and judgment appear intact.  Cooperative with normal attention span and concentration.  Behavior appropriate. No anxious or depressed appearing.      Assessment & Plan:   Assessment  Hypertension, COPD--former smoker,PFTs 8- 2013 mild obstruction otherwise normal. Asx  Paroxysmal A. fib DX 11/2017 Renal: (f/u @ BMI) --H/o Renal Cancer  --Solitary left kidney --Chronic renal insufficiency --hematuria: Dr Hazle Nordmann, cysto 01-2017 Neuro:   -on aricept per neuro, mild memory imparment -parkinson's   -neuropathy, saw neuro 06-2015, Dr Posey Pronto. As off 03-2016 is followed at [facility]:  +[name] gen neuro; +Dr Tonye Royalty now  Dr Hall Busing, movement d/o specialist   MSK: --DJD --Mild spinal stenosis, has seen  Dr Flavia Shipper Osteopenia--DEXA 07-2015, osteopenia, rx vit D Pancytopenia: sees hem-onc (Dr Cruzita Lederer, [facility]) , 2011 felt to be from doxycycline. Last OV 09-13-2017 AAA s/p EVAR 2009 Acne rosacea Admitted 11-2017: Angiography, complicated by a respiratory failure, new onset of P- A. fib.   Plan:  PNM?  See last visit, chest x-ray showed  question of infiltrate, currently on doxycycline and Ceftin.  He is doing about the same, again he has no cough, F/C. Fatigue, generalized weakness, question of upper and lower extremity pain: Sxs are ongoing, etiology unclear, most likely multifactorial (FTT?).  To be sure we will check a sed rate (PMR?)  and total CK understanding that  sed rate value will be helpful only if is over 100. CHF?  Saw nephrology few days ago, question of CHF according to the patient's daughter.  Will check a BNP CKD: Needs BMP and uric acid for his nephrologist, will do and fax results P-atrial fibrillation: EKG: artifacts but looks like Sinus rhythm with first degree AV block and occasional PAC's.( discussed with cardiology) RTC 1 month

## 2018-02-06 NOTE — Progress Notes (Signed)
Pre visit review using our clinic review tool, if applicable. No additional management support is needed unless otherwise documented below in the visit note. 

## 2018-02-06 NOTE — Assessment & Plan Note (Signed)
PNM?  See last visit, chest x-ray showed question of infiltrate, currently on doxycycline and Ceftin.  He is doing about the same, again he has no cough, F/C. Fatigue, generalized weakness, question of upper and lower extremity pain: Sxs are ongoing, etiology unclear, most likely multifactorial (FTT?).  To be sure we will check a sed rate (PMR?)  and total CK understanding that  sed rate value will be helpful only if is over 100. CHF?  Saw nephrology few days ago, question of CHF according to the patient's daughter.  Will check a BNP CKD: Needs BMP and uric acid for his nephrologist, will do and fax results P-atrial fibrillation: EKG: artifacts but looks like Sinus rhythm with first degree AV block and occasional PAC's.( discussed with cardiology) RTC 1 month

## 2018-02-07 ENCOUNTER — Telehealth: Payer: Self-pay | Admitting: Cardiology

## 2018-02-07 DIAGNOSIS — N390 Urinary tract infection, site not specified: Secondary | ICD-10-CM | POA: Diagnosis not present

## 2018-02-07 DIAGNOSIS — I10 Essential (primary) hypertension: Secondary | ICD-10-CM | POA: Diagnosis not present

## 2018-02-07 DIAGNOSIS — N184 Chronic kidney disease, stage 4 (severe): Secondary | ICD-10-CM | POA: Diagnosis not present

## 2018-02-07 LAB — CK: Total CK: 64 U/L (ref 44–196)

## 2018-02-07 LAB — BRAIN NATRIURETIC PEPTIDE: Brain Natriuretic Peptide: 1287 pg/mL — ABNORMAL HIGH (ref <100)

## 2018-02-07 NOTE — Telephone Encounter (Signed)
Accidentally closed last message

## 2018-02-07 NOTE — Addendum Note (Signed)
Addended byDamita Dunnings D on: 02/07/2018 11:23 AM   Modules accepted: Orders

## 2018-02-07 NOTE — Telephone Encounter (Signed)
Per Dr Larose Kells documentation:  Notes recorded by Colon Branch, MD on 02/07/2018 at 11:19 AM EST Please fax results to nephrology in [facility]. Please schedule a visit with cardiology, could he be seen within 10 days?. DX CHF ?.r Dr Larose Kells documentation: In review of systems by Dr Larose Kells on 02/06/18 No shortness of breath, no cough, no mention of edema or recent weight gain.  Only complaint of generalized weakness.    Pt to be scheduled for f/u with Dr Marlou Porch as requested.

## 2018-02-07 NOTE — Telephone Encounter (Signed)
New message  Pt daughter verbalized that she is calling for the RN pt was at [place] office and there is a referral for pt to see Dr.Skains   Pi last seen 12/2017 please advise   I48.0 (ICD-10-CM) - Paroxysmal atrial fibrillation (HCC) I50.9 (ICD-10-CM) - Congestive heart failure, unspecified HF chronicity, unspecified heart failure type ([place])

## 2018-02-08 ENCOUNTER — Encounter: Payer: Self-pay | Admitting: Cardiology

## 2018-02-08 ENCOUNTER — Ambulatory Visit (INDEPENDENT_AMBULATORY_CARE_PROVIDER_SITE_OTHER): Payer: Medicare Other | Admitting: Cardiology

## 2018-02-08 VITALS — BP 144/76 | HR 62 | Ht 68.0 in | Wt 165.0 lb

## 2018-02-08 DIAGNOSIS — I714 Abdominal aortic aneurysm, without rupture, unspecified: Secondary | ICD-10-CM

## 2018-02-08 DIAGNOSIS — I480 Paroxysmal atrial fibrillation: Secondary | ICD-10-CM

## 2018-02-08 DIAGNOSIS — R531 Weakness: Secondary | ICD-10-CM | POA: Diagnosis not present

## 2018-02-08 NOTE — Progress Notes (Signed)
Cardiology Office Note:    Date:  02/08/2018   ID:  [name], DOB September 04, 1927, MRN [id number]  PCP:  Colon Branch, MD  Cardiologist:  Candee Furbish, MD    Referring MD: Colon Branch, MD     History of Present Illness:    [name] is a 84 y.o. male with a hx of abdominal aortic aneurysm repair with Parkinson's disease who was hospitalized/discharged on 12/07/17 with debility secondary to acute respiratory failure and hypoxia, new onset paroxysmal atrial fibrillation, history of abdominal aortic aneurysm with no endoleak, occluded origin of inferior mesenteric artery.  Post procedure in interventional radiology became hypoxic, right lower lobe changes on x-ray.  He was then admitted.  He then went to rehab for several days.  He has chronic anemia and pancytopenia, single kidney, CKD.  Atrial fibrillation was noted post procedure, several hours duration.  Denied any symptoms of chest pain or shortness of breath.  His atrial fibrillation resolved during the hospitalization.  We discussed the potentials for anticoagulation however given his underlying thrombocytopenia this would be high risk for bleeding.  [name] is his daughter.  02/08/18 -since her last visit, he is taken a turn for the worse.  He is quite weak, has significant difficulty getting up from his travel wheelchair.  He has had weight loss.  He was recently started on torsemide from nephrology for questionable fluid retention/heart failure.  Has had 2 rounds of antibiotics.  Legs were tight.  He was also started on isosorbide. TSH in December was normal.   Past Medical History:  Diagnosis Date  . Abdominal aortic aneurysm ([place]) 09/2008   s/o endovascular repair and angiogram 10/04/08   . Anemia    EGD Cscope 08-2012  . Arthritis    "hips" (11/23/2017)  . Atrial fibrillation ([place])   . Bleeding stomach ulcer 1990s  . Bleeding ulcer 1997  . CKD (chronic kidney disease), stage IV (HCC)    Stable, Dr. Dimas Aguas  . COPD  (chronic obstructive pulmonary disease) ([place])    "mild" (11/23/2017)  . Dementia    "on very small dose of Aricept" (11/23/2017)  . H/O hyperkalemia    Stable  . Hypertension   . Leukopenia 2/11   thought to be from doxy  . Pneumonia 2018 X 2  . Renal cell carcinoma    clear cell type dx 10/09, s/p excision 10-24-08  . Renal insufficiency   . Solitary left kidney    W/ chronic tubule-interstitial damage, Dr. Dimas Aguas  . Spinal stenosis    causing LE weakness-persistant, being worked up by Hydrographic surveyor and neurologist    Past Surgical History:  Procedure Laterality Date  . ABDOMINAL AORTIC ANEURYSM REPAIR  2009   EVAR  . CATARACT EXTRACTION W/ INTRAOCULAR LENS  IMPLANT, BILATERAL Bilateral   . HEMORRHOID SURGERY  1990  . IR ANGIOGRAM PELVIS SELECTIVE OR SUPRASELECTIVE  11/24/2017  . IR ANGIOGRAM SELECTIVE EACH ADDITIONAL VESSEL  11/24/2017  . IR ANGIOGRAM SELECTIVE EACH ADDITIONAL VESSEL  11/24/2017  . IR ANGIOGRAM SELECTIVE EACH ADDITIONAL VESSEL  11/24/2017  . IR ANGIOGRAM SELECTIVE EACH ADDITIONAL VESSEL  11/24/2017  . IR ANGIOGRAM VISCERAL SELECTIVE  11/24/2017  . IR ANGIOGRAM VISCERAL SELECTIVE  11/24/2017  . IR RADIOLOGIST EVAL & MGMT  09/07/2017  . IR US GUIDE VASC ACCESS RIGHT  11/24/2017  . [place] (lip), 2004  . NEPHRECTOMY  2009   Open nephrectomy for cancer    Current Medications:  Current Meds  Medication Sig  . aspirin EC 81 MG tablet Take 81 mg by mouth daily.  . Carbidopa-Levodopa ER (SINEMET CR) 25-100 MG tablet controlled release Take 0.5 tablets by mouth 2 (two) times daily.  . cefUROXime (CEFTIN) 500 MG tablet Take 1 tablet (500 mg total) by mouth 2 (two) times daily with a meal.  . Cholecalciferol (VITAMIN D) 2000 units CAPS Take 2,000 Units by mouth daily.  Marland Kitchen docusate sodium (COLACE) 100 MG capsule Take 100 mg by mouth daily.  Marland Kitchen donepezil (ARICEPT) 5 MG tablet Take 5 mg by mouth at bedtime.  . FeFum-FePo-FA-B Cmp-C-Zn-Mn-Cu  (SE-TAN PLUS) 162-115.2-1 MG CAPS Take 1 capsule by mouth daily. Take 1 Tab  daily  . hydrALAZINE (APRESOLINE) 25 MG tablet Take 1 tablet (25 mg total) by mouth 3 (three) times daily.  . isosorbide mononitrate (IMDUR) 60 MG 24 hr tablet Take 60 mg by mouth daily.  . patiromer (VELTASSA) 8.4 g packet Take as directed  . prednisoLONE acetate (PRED FORTE) 1 % ophthalmic suspension Place 1 drop into both eyes 2 (two) times daily.  . tamsulosin (FLOMAX) 0.4 MG CAPS capsule Take 1 capsule (0.4 mg total) by mouth daily after supper.  . torsemide (DEMADEX) 20 MG tablet Take 20 mg by mouth daily.     Allergies:   Iodinated diagnostic agents; Ioxaglate; and Baclofen   Social History   Socioeconomic History  . Marital status: Widowed    Spouse name: None  . Number of children: 3  . Years of education: None  . Highest education level: None  Social Needs  . Financial resource strain: None  . Food insecurity - worry: None  . Food insecurity - inability: None  . Transportation needs - medical: None  . Transportation needs - non-medical: None  Occupational History  . Occupation: retired    Fish farm manager: RETIRED  Tobacco Use  . Smoking status: Former Smoker    Packs/day: 1.50    Years: 60.00    Pack years: 90.00    Last attempt to quit: 12/28/2007    Years since quitting: 10.1  . Smokeless tobacco: Never Used  Substance and Sexual Activity  . Alcohol use: No    Alcohol/week: 0.0 oz  . Drug use: No  . Sexual activity: None  Other Topics Concern  . None  Social History Narrative   Lives by himself    Lost wife to dementia 10-2015   Totally independent on ADL , no driving   Often came to office w/ his daughter [name] [phone]), his other daughter is  [name] , son is Mikhai                     Family History: The patient's family history includes Diabetes in his father; Hypertension in his father; Stroke in his father. There is no history of Colon cancer or Prostate cancer. ROS:   Please  see the history of present illness.     All other systems reviewed and are negative.  EKGs/Labs/Other Studies Reviewed:    The following studies were reviewed today:   ECHO 11/28/17 - Left ventricle: The cavity size was normal. There was severe   concentric hypertrophy. Systolic function was normal. The   estimated ejection fraction was in the range of 55% to 60%. Wall   motion was normal; there were no regional wall motion   abnormalities. - Aortic valve: Transvalvular velocity was within the normal range.   There was no stenosis. There was mild  regurgitation. - Mitral valve: Transvalvular velocity was within the normal range.   There was no evidence for stenosis. There was mild regurgitation. - Left atrium: The atrium was severely dilated. - Right ventricle: The cavity size was normal. Wall thickness was   normal. Systolic function was normal. - Tricuspid valve: There was mild regurgitation. - Pulmonary arteries: Systolic pressure was mildly increased. PA   peak pressure: 46 mm Hg (S). - Pericardium,   EKG:  EKG from 02/08/18-baseline is clear, P waves noted, sinus rhythm with PACs, right bundle branch block, left anterior fascicular block, PVCs noted.  02/06/18 - SR, artifact, 1st degree AVB with PAC's, RBBB personally viewed. Prior 11/27/17 - AFIB 86 RBBB  Recent Labs: 11/28/2017: TSH 2.836 11/30/2017: ALT 9 01/31/2018: Hemoglobin 10.7; Platelets 88.0 02/06/2018: Brain Natriuretic Peptide 1,287; BUN 32; Creatinine, Ser 2.50; Potassium 4.6; Sodium 145  Recent Lipid Panel    Component Value Date/Time   CHOL 188 05/12/2017 0909   TRIG 78.0 05/12/2017 0909   HDL 50.60 05/12/2017 0909   CHOLHDL 4 05/12/2017 0909   VLDL 15.6 05/12/2017 0909   LDLCALC 122 (H) 05/12/2017 0909   LDLDIRECT 134.4 10/19/2011 0932    Physical Exam:    VS:  BP (!) 144/76   Pulse 62   Ht 5\' 8"  (1.727 m)   Wt 165 lb (74.8 kg)   BMI 25.09 kg/m     Wt Readings from Last 3 Encounters:  02/08/18 165 lb  (74.8 kg)  02/06/18 157 lb (71.2 kg)  01/30/18 157 lb 4 oz (71.3 kg)     GEN:  Frail, in wheel chair, challenging to stand, in no acute distress HEENT: Mild JVD NECK:  No carotid bruits LYMPHATICS: No lymphadenopathy CARDIAC: RRR with rare ectopy, no murmurs, rubs, gallops RESPIRATORY:  Clear to auscultation without rales, wheezing or rhonchi  ABDOMEN: Soft, non-tender, non-distended MUSCULOSKELETAL:  L>R edema; No deformity  SKIN: Warm and dry NEUROLOGIC:  Alert  PSYCHIATRIC:  Normal affect   ASSESSMENT:    1. PAF (paroxysmal atrial fibrillation) ([place])   2. AAA (abdominal aortic aneurysm) without rupture (HCC)   3. Weakness    PLAN:    In order of problems listed above:  Acute diastolic heart failure -Certainly his difficult to resolve cough with elevated BNP could have been related to increased intravascular volume.  Legs were described as edematous, L>R.  Currently on torsemide initiated by nephrology.  Creatinine 2.5.  Certainly given his advanced age, he has a degree of diastolic dysfunction/stiffness.  BNP was 1600 in the hospital, currently 1200. 10 pounds off Torsemide.  Given his weakness, I will check a TSH.  It was normal in December.  It is hard for me however to begin his feeling of gravitational pull, weakness generalized to diastolic dysfunction.  Thankfully, sed rate and CK are normal.  Paroxysmal atrial fibrillation -Postprocedural in interventional radiology.  Subsequently autoconverted.  We had lengthy discussion about risks and benefits of anticoagulation and given his chronic thrombocytopenia, advanced age we decided not to anticoagulate during his hospital stay in early December.  He has had subsequent visit with hematology.  Hopefully he will continue to maintain sinus rhythm.  If atrial fibrillation recurs, he is at risk for stroke.  His hematologist stated that he could be on low-dose aspirin 81 mg a day.  He is going to continue with this.  We discussed that a  trial data, aspirin does not reduce stroke risk in atrial fibrillation.  Repeat ECG today, 02/08/18 clearly  shows P waves, no evidence of atrial fibrillation.  Essential hypertension -Hydralazine, Imdur, torsemide.  AAA endovascular leak repair  - IR. Dr. Trula Slade monitoring. EVAR 2009  CKD 4  - BNP challenging to interpret in this setting.   Atypical chest pain -Lasted approximately 1 second duration.  Parkinson's disease - On carbidopa levodopa  Dementia -On Aricept  Thrombocytopenia - Platelet count 88,000 on 01/31/18 down from 133 in December 2018.   Medication Adjustments/Labs and Tests Ordered: Current medicines are reviewed at length with the patient today.  Concerns regarding medicines are outlined above.  Orders Placed This Encounter  Procedures  . TSH  . EKG 12-Lead   No orders of the defined types were placed in this encounter.   Signed, Candee Furbish, MD  02/08/2018 8:56 AM    [facility]

## 2018-02-08 NOTE — Patient Instructions (Signed)
Medication Instructions:  Your physician recommends that you continue on your current medications as directed. Please refer to the Current Medication list given to you today.  Labwork: TSH today  Testing/Procedures: None  Follow-Up: Your physician recommends that you schedule a follow-up appointment in: 3 months with Dr. Marlou Porch.    Any Other Special Instructions Will Be Listed Below (If Applicable).     If you need a refill on your cardiac medications before your next appointment, please call your pharmacy.

## 2018-02-09 LAB — TSH: TSH: 4.11 u[IU]/mL (ref 0.450–4.500)

## 2018-02-14 ENCOUNTER — Ambulatory Visit: Payer: Self-pay

## 2018-02-14 ENCOUNTER — Encounter: Payer: Self-pay | Admitting: *Deleted

## 2018-02-14 ENCOUNTER — Telehealth: Payer: Self-pay | Admitting: Internal Medicine

## 2018-02-14 DIAGNOSIS — J189 Pneumonia, unspecified organism: Secondary | ICD-10-CM

## 2018-02-14 DIAGNOSIS — N184 Chronic kidney disease, stage 4 (severe): Secondary | ICD-10-CM | POA: Diagnosis not present

## 2018-02-14 DIAGNOSIS — I10 Essential (primary) hypertension: Secondary | ICD-10-CM | POA: Diagnosis not present

## 2018-02-14 DIAGNOSIS — R059 Cough, unspecified: Secondary | ICD-10-CM

## 2018-02-14 DIAGNOSIS — R05 Cough: Secondary | ICD-10-CM

## 2018-02-14 MED ORDER — SODIUM CHLORIDE 0.9 % IN NEBU: INHALATION_SOLUTION | RESPIRATORY_TRACT | 12 refills | Status: DC

## 2018-02-14 MED ORDER — BUDESONIDE 0.25 MG/2ML IN SUSP: 0.2500 mg | Freq: Two times a day (BID) | RESPIRATORY_TRACT | 12 refills | Status: DC

## 2018-02-14 NOTE — Telephone Encounter (Addendum)
Spoke w/ Corene Cornea at [facility], they do not have resp therapist that go to home, and they no longer supply medications for the nebulizers only the equipment. Also spoke w/ Tammy w/ [facility], they also do not have RT's on staff to go to homes. Will send nursing out from [facility] for teaching of nebulizer machine. Orders for home health placed. Nebulizer order placed. Rx's for Pulmicort and saline neb sent to [facility].

## 2018-02-14 NOTE — Telephone Encounter (Signed)
Spoke w/ [name], she is concerned because his father had a lot of sputum this morning, "he almost choked" on mucus. He continued to feel fatigue and slightly weak.  Denies fever chills He has moderate cough. I believe I can help him with management of mucus, I am not sure why he feels so weak in general.  Is likely a multifactorial issue. Plan: Enter a order for chest x-ray for this week.  DX cough, follow-up pneumonia [facility] to [facility] 1.  He needs nebulizer 2.  Start budesonide 0.25 mg 1 neb twice a day.  Send a prescription 3. Please arrange nebulizer delivery with the patient's daughter, [name]. 4. Ask respiratory therapy to show them to help mobilize secretions such as percussion therapy. 5.He might benefit from saline nebulizations twice a day in addition to budesonide.

## 2018-02-14 NOTE — Telephone Encounter (Signed)
Spoke to Rod Holler at office who will call '[name]' pt's daughter now.

## 2018-02-14 NOTE — Telephone Encounter (Signed)
Talked to patient's daughter to notify her Per [name] he will call her later to discuss. She said to please leave a detail message if she is not able to answer since she will be in and out of meetings today due to her work.

## 2018-02-14 NOTE — Addendum Note (Signed)
Addended byDamita Dunnings D on: 02/14/2018 02:18 PM   Modules accepted: Orders

## 2018-02-14 NOTE — Telephone Encounter (Signed)
This encounter was created in error - please disregard.

## 2018-02-14 NOTE — Addendum Note (Signed)
Addended by: Kathlene November E on: 02/14/2018 12:58 PM   Modules accepted: Orders

## 2018-02-14 NOTE — Telephone Encounter (Signed)
[name] reports her father has had no improvement with cough/ congestion after 2 rounds of antibiotics. "Almost choked on mucous this morning."  Daughter expresses much frustration over "Not getting direct answers and "an action plan."  Would like to speak to someone in office "who has answers." Did not triage patient as daughter clearly upset with process , "You should have that information already."  Please advise. [phone]

## 2018-02-16 ENCOUNTER — Telehealth: Payer: Self-pay | Admitting: Internal Medicine

## 2018-02-16 DIAGNOSIS — I10 Essential (primary) hypertension: Secondary | ICD-10-CM | POA: Diagnosis not present

## 2018-02-16 DIAGNOSIS — N184 Chronic kidney disease, stage 4 (severe): Secondary | ICD-10-CM | POA: Diagnosis not present

## 2018-02-16 DIAGNOSIS — N390 Urinary tract infection, site not specified: Secondary | ICD-10-CM | POA: Diagnosis not present

## 2018-02-16 NOTE — Telephone Encounter (Signed)
Left a message for [name], we did start steroids nebulizer, that should help a lot with secretions, we could use albuterol if needed in the future however excessive albuterol may trigger I increasing his heart rate.  Recommend to call me back if needed.

## 2018-02-16 NOTE — Telephone Encounter (Signed)
Copied from [place] [id number]. Topic: General - Other >> Feb 16, 2018  2:25 PM Darl Householder, RMA wrote: Reason for CRM: patient is calling stating he saw nephrology today and was instructed to ask [name] if he should have steroid inhaler to accompany the nebulizer that pt has just been started on , please return call to daughter Margret Chance at [phone]

## 2018-02-16 NOTE — Telephone Encounter (Signed)
Please advise 

## 2018-02-17 ENCOUNTER — Ambulatory Visit (HOSPITAL_BASED_OUTPATIENT_CLINIC_OR_DEPARTMENT_OTHER)
Admission: RE | Admit: 2018-02-17 | Discharge: 2018-02-17 | Disposition: A | Discharge status: Home / Self Care | Payer: Medicare Other | Source: Ambulatory Visit | Attending: Internal Medicine | Admitting: Internal Medicine

## 2018-02-17 DIAGNOSIS — I878 Other specified disorders of veins: Secondary | ICD-10-CM | POA: Diagnosis not present

## 2018-02-17 DIAGNOSIS — R918 Other nonspecific abnormal finding of lung field: Secondary | ICD-10-CM | POA: Diagnosis not present

## 2018-02-17 DIAGNOSIS — R05 Cough: Secondary | ICD-10-CM | POA: Insufficient documentation

## 2018-02-17 DIAGNOSIS — I517 Cardiomegaly: Secondary | ICD-10-CM | POA: Insufficient documentation

## 2018-02-17 DIAGNOSIS — J189 Pneumonia, unspecified organism: Secondary | ICD-10-CM | POA: Insufficient documentation

## 2018-03-03 ENCOUNTER — Other Ambulatory Visit: Payer: Self-pay | Admitting: Surgery

## 2018-03-06 ENCOUNTER — Ambulatory Visit (HOSPITAL_COMMUNITY)
Admission: RE | Admit: 2018-03-06 | Discharge: 2018-03-06 | Disposition: A | Discharge status: Home / Self Care | Payer: Medicare Other | Source: Ambulatory Visit | Attending: Surgery | Admitting: Surgery

## 2018-03-06 ENCOUNTER — Other Ambulatory Visit: Payer: Self-pay

## 2018-03-06 ENCOUNTER — Encounter: Payer: Self-pay | Admitting: Surgery

## 2018-03-06 ENCOUNTER — Ambulatory Visit (INDEPENDENT_AMBULATORY_CARE_PROVIDER_SITE_OTHER): Payer: Medicare Other | Admitting: Surgery

## 2018-03-06 VITALS — BP 199/90 | HR 76 | Temp 98.1°F | Resp 16 | Ht 68.0 in | Wt 150.8 lb

## 2018-03-06 DIAGNOSIS — I714 Abdominal aortic aneurysm, without rupture, unspecified: Secondary | ICD-10-CM

## 2018-03-06 NOTE — Progress Notes (Signed)
Vascular and Vein Specialist of [facility]  Patient name: [name] MRN: [id number] DOB: [birth year] Sex: male   REASON FOR VISIT:    Follow up  HISOTRY OF PRESENT ILLNESS:    [name] is a 84 y.o. male who returns today for follow-up.  He is status post endovascular repair of a infrarenal abdominal aortic aneurysm on 10/04/2008.  Prior to this procedure he underwent right nephrectomy due to a renal cell tumor.  He has been followed with noncontrast CT scan imaging.  After his visit 6 months ago, I referred him to interventional radiology for possible embolization of his presumed endoleak.  He underwent an attempt at embolization.  This was complicated by volume overload for his hydration which led to a collapsed lung.  He spent several weeks in the hospital.  He finally got home in December 2018 and has been undergoing outpatient rehab.  He continues to feel very weak.  PAST MEDICAL HISTORY:   Past Medical History:  Diagnosis Date  . Abdominal aortic aneurysm ([place]) 09/2008   s/o endovascular repair and angiogram 10/04/08   . Anemia    EGD Cscope 08-2012  . Arthritis    "hips" (11/23/2017)  . Atrial fibrillation ([place])   . Bleeding stomach ulcer 1990s  . Bleeding ulcer 1997  . CKD (chronic kidney disease), stage IV (HCC)    Stable, Dr. Dimas Aguas  . COPD (chronic obstructive pulmonary disease) ([place])    "mild" (11/23/2017)  . Dementia    "on very small dose of Aricept" (11/23/2017)  . H/O hyperkalemia    Stable  . Hypertension   . Leukopenia 2/11   thought to be from doxy  . Pneumonia 2018 X 2  . Renal cell carcinoma    clear cell type dx 10/09, s/p excision 10-24-08  . Renal insufficiency   . Solitary left kidney    W/ chronic tubule-interstitial damage, Dr. Dimas Aguas  . Spinal stenosis    causing LE weakness-persistant, being worked up by Hydrographic surveyor and neurologist     FAMILY HISTORY:   Family History  Problem  Relation Age of Onset  . Diabetes Father        ?  . Stroke Father   . Hypertension Father   . Colon cancer Neg Hx   . Prostate cancer Neg Hx     SOCIAL HISTORY:   Social History   Tobacco Use  . Smoking status: Former Smoker    Packs/day: 1.50    Years: 60.00    Pack years: 90.00    Last attempt to quit: 12/28/2007    Years since quitting: 10.1  . Smokeless tobacco: Never Used  Substance Use Topics  . Alcohol use: No    Alcohol/week: 0.0 oz     ALLERGIES:   Allergies  Allergen Reactions  . Iodinated Diagnostic Agents Other (See Comments)    Only one kidney   . Ioxaglate Other (See Comments)    Only one kidney  Only one kidney   . Baclofen Other (See Comments)    Unable to walk per Pt's family  Unable to walk per Pt's family  "turns him upside down"      CURRENT MEDICATIONS:   Current Outpatient Medications  Medication Sig Dispense Refill  . aspirin EC 81 MG tablet Take 81 mg by mouth daily.    . budesonide (PULMICORT) 0.25 MG/2ML nebulizer solution Take 2 mLs (0.25 mg total) by nebulization 2 (two) times daily. Alternate w/ sodium chloride neb. 60 mL  12  . Carbidopa-Levodopa ER (SINEMET CR) 25-100 MG tablet controlled release Take 0.5 tablets by mouth 2 (two) times daily.    . Cholecalciferol (VITAMIN D) 2000 units CAPS Take 2,000 Units by mouth daily.    Marland Kitchen docusate sodium (COLACE) 100 MG capsule Take 100 mg by mouth daily.    Marland Kitchen donepezil (ARICEPT) 5 MG tablet Take 5 mg by mouth at bedtime.    . FeFum-FePo-FA-B Cmp-C-Zn-Mn-Cu (SE-TAN PLUS) 162-115.2-1 MG CAPS Take 1 capsule by mouth daily. Take 1 Tab  daily    . hydrALAZINE (APRESOLINE) 25 MG tablet Take 1 tablet (25 mg total) by mouth 3 (three) times daily. 90 tablet 0  . isosorbide mononitrate (IMDUR) 60 MG 24 hr tablet Take 60 mg by mouth daily.    . patiromer (VELTASSA) 8.4 g packet Take as directed    . prednisoLONE acetate (PRED FORTE) 1 % ophthalmic suspension Place 1 drop into both eyes 2 (two) times  daily.  0  . sodium chloride 0.9 % nebulizer solution Take 3 mLs by nebulizer at 12 PM and at bedtime (alternate w/ Pulmicort neb). 90 mL 12  . tamsulosin (FLOMAX) 0.4 MG CAPS capsule Take 1 capsule (0.4 mg total) by mouth daily after supper. 30 capsule 5  . torsemide (DEMADEX) 20 MG tablet Take 20 mg by mouth daily.     No current facility-administered medications for this visit.     REVIEW OF SYSTEMS:   [X]  denotes positive finding, [ ]  denotes negative finding Cardiac  Comments:  Chest pain or chest pressure:    Shortness of breath upon exertion:    Short of breath when lying flat:    Irregular heart rhythm:        Vascular    Pain in calf, thigh, or hip brought on by ambulation:    Pain in feet at night that wakes you up from your sleep:     Blood clot in your veins:    Leg swelling:         Pulmonary    Oxygen at home:    Productive cough:     Wheezing:         Neurologic    Sudden weakness in arms or legs:     Sudden numbness in arms or legs:     Sudden onset of difficulty speaking or slurred speech:    Temporary loss of vision in one eye:     Problems with dizziness:         Gastrointestinal    Blood in stool:     Vomited blood:         Genitourinary    Burning when urinating:     Blood in urine:        Psychiatric    Major depression:         Hematologic    Bleeding problems:    Problems with blood clotting too easily:        Skin    Rashes or ulcers:        Constitutional    Fever or chills:      PHYSICAL EXAM:   There were no vitals filed for this visit.  GENERAL: The patient is a well-nourished male, in no acute distress. The vital signs are documented above. CARDIAC: There is a regular rate and rhythm.  VASCULAR: 2+ pitting edema bilateral lower extremity PULMONARY: Non-labored respirations ABDOMEN: Soft and non-tender.  No pulsatile mass MUSCULOSKELETAL: There are no major deformities or cyanosis. NEUROLOGIC:  No focal weakness or  paresthesias are detected. SKIN: There are no ulcers or rashes noted. PSYCHIATRIC: The patient has a normal affect.  STUDIES:   Original size:  5.8 11/2010:  6.1 01/2015:   6.1 08/2016:  6.3 02/2017:  6.7 06/2017:  7.0  Ultrasound today shows no evidence of endoleak and the maximum diameter of 7 cm  MEDICAL ISSUES:   I discussed with the patient and the daughter that after what he went through for attempted embolization, I do not feel that an additional attempt is warranted at this time.  Fortunately, no endoleak has been identified on ultrasound today, and there has not been any change in the maximum diameter of his aneurysm sac.  He will continue with outpatient rehab.  I have him scheduled for follow-up in 6 months with a repeat duplex ultrasound.    Annamarie Major, MD Vascular and Vein Specialists of [facility] [phone] Pager [phone]

## 2018-03-07 ENCOUNTER — Other Ambulatory Visit: Payer: Self-pay

## 2018-03-07 DIAGNOSIS — I714 Abdominal aortic aneurysm, without rupture, unspecified: Secondary | ICD-10-CM

## 2018-03-10 ENCOUNTER — Other Ambulatory Visit: Payer: Self-pay | Admitting: Internal Medicine

## 2018-03-10 ENCOUNTER — Other Ambulatory Visit: Payer: Self-pay | Admitting: *Deleted

## 2018-03-10 DIAGNOSIS — J449 Chronic obstructive pulmonary disease, unspecified: Secondary | ICD-10-CM

## 2018-03-10 MED ORDER — BUDESONIDE 0.25 MG/2ML IN SUSP: 0.2500 mg | Freq: Two times a day (BID) | RESPIRATORY_TRACT | 12 refills | Status: DC

## 2018-03-10 MED ORDER — BUDESONIDE 0.25 MG/2ML IN SUSP: 0.2500 mg | Freq: Two times a day (BID) | RESPIRATORY_TRACT | 12 refills | Status: AC

## 2018-03-10 MED ORDER — SODIUM CHLORIDE 0.9 % IN NEBU: INHALATION_SOLUTION | RESPIRATORY_TRACT | 12 refills | Status: DC

## 2018-03-10 NOTE — Addendum Note (Signed)
Addended byDamita Dunnings D on: 03/10/2018 04:32 PM   Modules accepted: Orders

## 2018-03-10 NOTE — Telephone Encounter (Signed)
Received fax confirmation

## 2018-03-10 NOTE — Telephone Encounter (Signed)
Copied from [place] [id number]. Topic: Quick Communication - See Telephone Encounter >> Mar 10, 2018 10:07 AM Aurelio Brash B wrote: CRM for notification. See Telephone encounter for:  Helene Kelp from [facility] is asking for DX code for the rx budesonide (PULMICORT) 0.25 MG/2ML nebulizer .  Her contact number is [phone] 03/10/18.

## 2018-03-10 NOTE — Telephone Encounter (Addendum)
The pharmacy called because they need the CMN form filled out.   Pharmacy states they spoke to the insurance company who advised they sent to your office first time in Feb then again this morning a CMN form and that MUST be filled out completely. She said is is a form with about 15 question and the dr needs to initial and sign all places.  She says it needs to be very thorough or the insurance company will reject.  Pharmacy states they provided refills for 3 months without this form, but the time is up and they in order for pt to get this medicine  Pt will be out of medicine tomorrow.  Daughter has called and is very upset they cannot get this filled.  Daughter name is Bethena Roys Copland @ [phone]   [facility], [place] - [address] AT [facility] [address] [phone] (Phone) [phone] (Fax)

## 2018-03-10 NOTE — Telephone Encounter (Signed)
Form sent for scanning.  

## 2018-03-10 NOTE — Telephone Encounter (Signed)
Received form- (I haven't seen it prior to today). Will complete and fax.

## 2018-03-10 NOTE — Telephone Encounter (Signed)
Rx sent w/ Dx codes. Rx re-sent again.

## 2018-03-13 ENCOUNTER — Encounter: Payer: Self-pay | Admitting: Internal Medicine

## 2018-03-13 ENCOUNTER — Ambulatory Visit (INDEPENDENT_AMBULATORY_CARE_PROVIDER_SITE_OTHER): Payer: Medicare Other | Admitting: Internal Medicine

## 2018-03-13 VITALS — BP 150/86 | HR 75 | Temp 97.5°F | Resp 16 | Ht 68.0 in | Wt 148.0 lb

## 2018-03-13 DIAGNOSIS — J449 Chronic obstructive pulmonary disease, unspecified: Secondary | ICD-10-CM | POA: Diagnosis not present

## 2018-03-13 DIAGNOSIS — I480 Paroxysmal atrial fibrillation: Secondary | ICD-10-CM

## 2018-03-13 DIAGNOSIS — R5383 Other fatigue: Secondary | ICD-10-CM | POA: Diagnosis not present

## 2018-03-13 DIAGNOSIS — N184 Chronic kidney disease, stage 4 (severe): Secondary | ICD-10-CM

## 2018-03-13 NOTE — Patient Instructions (Addendum)
Next visit in 4-6 weeks  For management of secretions: Saline  nebulization early in the morning and late at night (before bedtime) Budesonide once daily  Chest physical therapy twice a day if possible  Continue monitoring BPs

## 2018-03-13 NOTE — Progress Notes (Signed)
Subjective:    Patient ID: [name], male    DOB: 03-Oct-1927, 84 y.o.   MRN: [id number]  DOS:  03/13/2018 Type of visit - description : f/u Interval history: Here with his daughter [name]. Lack of energy, fatigue: About the same, leg is still weak, arms a little stronger?. Ambulatory BPs usually in the 140s, occasionally 150s. Mental status at baseline. Since the last time he is having a hard time moving lung secretions, I recommend  nebulizations, no obvious benefit noted    Review of Systems   Past Medical History:  Diagnosis Date  . Abdominal aortic aneurysm ([place]) 09/2008   s/o endovascular repair and angiogram 10/04/08   . Anemia    EGD Cscope 08-2012  . Arthritis    "hips" (11/23/2017)  . Atrial fibrillation ([place])   . Bleeding stomach ulcer 1990s  . Bleeding ulcer 1997  . CKD (chronic kidney disease), stage IV (HCC)    Stable, Dr. Dimas Aguas  . COPD (chronic obstructive pulmonary disease) ([place])    "mild" (11/23/2017)  . Dementia    "on very small dose of Aricept" (11/23/2017)  . H/O hyperkalemia    Stable  . Hypertension   . Leukopenia 2/11   thought to be from doxy  . Pneumonia 2018 X 2  . Renal cell carcinoma    clear cell type dx 10/09, s/p excision 10-24-08  . Renal insufficiency   . Solitary left kidney    W/ chronic tubule-interstitial damage, Dr. Dimas Aguas  . Spinal stenosis    causing LE weakness-persistant, being worked up by Hydrographic surveyor and neurologist    Past Surgical History:  Procedure Laterality Date  . ABDOMINAL AORTIC ANEURYSM REPAIR  2009   EVAR  . CATARACT EXTRACTION W/ INTRAOCULAR LENS  IMPLANT, BILATERAL Bilateral   . HEMORRHOID SURGERY  1990  . IR ANGIOGRAM PELVIS SELECTIVE OR SUPRASELECTIVE  11/24/2017  . IR ANGIOGRAM SELECTIVE EACH ADDITIONAL VESSEL  11/24/2017  . IR ANGIOGRAM SELECTIVE EACH ADDITIONAL VESSEL  11/24/2017  . IR ANGIOGRAM SELECTIVE EACH ADDITIONAL VESSEL  11/24/2017  . IR ANGIOGRAM SELECTIVE EACH ADDITIONAL  VESSEL  11/24/2017  . IR ANGIOGRAM VISCERAL SELECTIVE  11/24/2017  . IR ANGIOGRAM VISCERAL SELECTIVE  11/24/2017  . IR RADIOLOGIST EVAL & MGMT  09/07/2017  . IR US GUIDE VASC ACCESS RIGHT  11/24/2017  . [place] (lip), 2004  . NEPHRECTOMY  2009   Open nephrectomy for cancer    Social History   Socioeconomic History  . Marital status: Widowed    Spouse name: Not on file  . Number of children: 3  . Years of education: Not on file  . Highest education level: Not on file  Social Needs  . Financial resource strain: Not on file  . Food insecurity - worry: Not on file  . Food insecurity - inability: Not on file  . Transportation needs - medical: Not on file  . Transportation needs - non-medical: Not on file  Occupational History  . Occupation: retired    Fish farm manager: RETIRED  Tobacco Use  . Smoking status: Former Smoker    Packs/day: 1.50    Years: 60.00    Pack years: 90.00    Last attempt to quit: 12/28/2007    Years since quitting: 10.2  . Smokeless tobacco: Never Used  Substance and Sexual Activity  . Alcohol use: No    Alcohol/week: 0.0 oz  . Drug use: No  . Sexual activity: Not on file  Other Topics Concern  . Not on file  Social History Narrative   Lives by himself    Lost wife to dementia 10-2015   Totally independent on ADL , no driving   Often came to office w/ his daughter [name] [phone]), his other daughter is  [name] , son is Shlok                      Allergies as of 03/13/2018      Reactions   Iodinated Diagnostic Agents Other (See Comments)   Only one kidney    Ioxaglate Other (See Comments)   Only one kidney  Only one kidney    Baclofen Other (See Comments)   Unable to walk per Pt's family  Unable to walk per Pt's family  "turns him upside down"       Medication List        Accurate as of 03/13/18  1:54 PM. Always use your most recent med list.          aspirin EC 81 MG tablet Take 81 mg by mouth daily.   budesonide 0.25  MG/2ML nebulizer solution Commonly known as:  PULMICORT Take 2 mLs (0.25 mg total) by nebulization 2 (two) times daily. Alternate w/ sodium chloride neb.   Carbidopa-Levodopa ER 25-100 MG tablet controlled release Commonly known as:  SINEMET CR Take 0.5 tablets by mouth 2 (two) times daily.   DEMADEX 20 MG tablet Generic drug:  torsemide Take 20 mg by mouth daily.   docusate sodium 100 MG capsule Commonly known as:  COLACE Take 100 mg by mouth daily.   donepezil 5 MG tablet Commonly known as:  ARICEPT Take 5 mg by mouth at bedtime.   hydrALAZINE 25 MG tablet Commonly known as:  APRESOLINE Take 1 tablet (25 mg total) by mouth 3 (three) times daily.   isosorbide mononitrate 60 MG 24 hr tablet Commonly known as:  IMDUR Take 60 mg by mouth daily.   patiromer 8.4 g packet Commonly known as:  VELTASSA Take as directed   prednisoLONE acetate 1 % ophthalmic suspension Commonly known as:  PRED FORTE Place 1 drop into both eyes 2 (two) times daily.   SE-TAN PLUS 162-115.2-1 MG Caps Take 1 capsule by mouth daily. Take 1 Tab  daily   sodium chloride 0.9 % nebulizer solution Take 3 mLs by nebulizer at 12 PM and at bedtime (alternate w/ Pulmicort neb).   tamsulosin 0.4 MG Caps capsule Commonly known as:  FLOMAX Take 1 capsule (0.4 mg total) by mouth daily after supper.   Vitamin D 2000 units Caps Take 2,000 Units by mouth daily.          Objective:   Physical Exam BP (!) 150/86 (BP Location: Right Arm, Patient Position: Sitting, Cuff Size: Small)   Pulse 75   Temp (!) 97.5 F (36.4 C) (Oral)   Resp 16   Ht 5\' 8"  (1.727 m)   Wt 148 lb (67.1 kg)   SpO2 96%   BMI 22.50 kg/m  General:   Well developed, elderly gentleman, no distress. HEENT:  Normocephalic . Face symmetric, atraumatic Lungs:  decrease breath sounds Normal respiratory effort, no intercostal retractions, no accessory muscle use. Heart: seems regular today.  No pretibial edema bilaterally  Skin:  Not pale. Not jaundice Neurologic:  alert & oriented X3.  Speech normal, gait not tested Psych--  Cognition and judgment appear intact.  Cooperative with normal attention span and concentration.  Behavior appropriate.  No anxious or depressed appearing.      Assessment & Plan:    Assessment  Hypertension, COPD--former smoker,PFTs 8- 2013 mild obstruction otherwise normal. Asx  Paroxysmal A. fib DX 11/2017 Renal: (f/u @ BMI) --H/o Renal Cancer  --Solitary left kidney --Chronic renal insufficiency --hematuria: Dr Hazle Nordmann, cysto 01-2017 Neuro:   -on aricept per neuro, mild memory imparment -parkinson's   -neuropathy, saw neuro 06-2015, Dr Posey Pronto. As off 03-2016 is followed at [facility]:  +[name] gen neuro; +Dr Tonye Royalty now  Dr Hall Busing, movement d/o specialist   MSK: --DJD --Mild spinal stenosis, has seen  Dr Flavia Shipper Osteopenia--DEXA 07-2015, osteopenia, rx vit D Pancytopenia: sees hem-onc (Dr Cruzita Lederer, [facility]) , 2011 felt to be from doxycycline. Last OV 09-13-2017 AAA s/p EVAR 2009 Acne rosacea Admitted 11-2017: Angiography, complicated by a respiratory failure, new onset of P- A. fib.   Plan:  Fatigue, generalized weakness, question of upper and lower extremity pain: See last visit, sed rate and CK were normal.  Currently  weakness mostly in the LE; sx likely multifactorial.  rx observation for now COPD with difficult pulmonary secretions management: after  the last visit, the patient's daughter reported a lot of mucus and difficulty moving secretions, I recommended chest physical therapy and nebulizations with steroids and saline.  Was not able to get chest physical therapy.  Nebulizations providing  marginal help? At the end of a visit we agreed to continue saline nebulizations twice a day steroid nebulizations once daily. I taught  the daughter chest physical therapy Consider pulmonary referral. Renal failure: Follow-up by nephrology closely Paroxysmal A. fib, CHF: Saw cardiology  02/08/2018, they agreed that given elevated BNP and sx he  has some degree of diastolic dysfunction. RTC 4-6 weeks

## 2018-03-13 NOTE — Telephone Encounter (Signed)
[facility] called stating all changes need initialed and dated. She has noted and is refaxing to [phone]

## 2018-03-13 NOTE — Telephone Encounter (Signed)
[facility] called and said that they need the correct diagnosis code on it. Please fill out again and re - send. Please fax back [phone]

## 2018-03-14 NOTE — Assessment & Plan Note (Signed)
Fatigue, generalized weakness, question of upper and lower extremity pain: See last visit, sed rate and CK were normal.  Currently  weakness mostly in the LE; sx likely multifactorial.  rx observation for now COPD with difficult pulmonary secretions management: after  the last visit, the patient's daughter reported a lot of mucus and difficulty moving secretions, I recommended chest physical therapy and nebulizations with steroids and saline.  Was not able to get chest physical therapy.  Nebulizations providing  marginal help? At the end of a visit we agreed to continue saline nebulizations twice a day steroid nebulizations once daily. I taught  the daughter chest physical therapy Consider pulmonary referral. Renal failure: Follow-up by nephrology closely Paroxysmal A. fib, CHF: Saw cardiology 02/08/2018, they agreed that given elevated BNP and sx he  has some degree of diastolic dysfunction. RTC 4-6 weeks

## 2018-03-15 NOTE — Telephone Encounter (Signed)
Will await form

## 2018-03-16 NOTE — Telephone Encounter (Signed)
Received fax confirmation

## 2018-03-16 NOTE — Telephone Encounter (Signed)
Form received- issues fixed- initialed- dated and faxed to [place] [place] at [phone]. Form sent for scanning.

## 2018-03-28 DIAGNOSIS — N184 Chronic kidney disease, stage 4 (severe): Secondary | ICD-10-CM | POA: Diagnosis not present

## 2018-03-28 DIAGNOSIS — I10 Essential (primary) hypertension: Secondary | ICD-10-CM | POA: Diagnosis not present

## 2018-03-30 DIAGNOSIS — I10 Essential (primary) hypertension: Secondary | ICD-10-CM | POA: Diagnosis not present

## 2018-03-30 DIAGNOSIS — N184 Chronic kidney disease, stage 4 (severe): Secondary | ICD-10-CM | POA: Diagnosis not present

## 2018-03-30 DIAGNOSIS — N390 Urinary tract infection, site not specified: Secondary | ICD-10-CM | POA: Diagnosis not present

## 2018-04-06 DIAGNOSIS — L218 Other seborrheic dermatitis: Secondary | ICD-10-CM | POA: Diagnosis not present

## 2018-04-10 DIAGNOSIS — D61818 Other pancytopenia: Secondary | ICD-10-CM | POA: Diagnosis not present

## 2018-04-10 DIAGNOSIS — N189 Chronic kidney disease, unspecified: Secondary | ICD-10-CM | POA: Diagnosis not present

## 2018-04-10 DIAGNOSIS — Z862 Personal history of diseases of the blood and blood-forming organs and certain disorders involving the immune mechanism: Secondary | ICD-10-CM | POA: Diagnosis not present

## 2018-04-13 DIAGNOSIS — Z79899 Other long term (current) drug therapy: Secondary | ICD-10-CM | POA: Diagnosis not present

## 2018-04-13 DIAGNOSIS — G20 Parkinson's disease: Secondary | ICD-10-CM | POA: Diagnosis not present

## 2018-04-13 DIAGNOSIS — Z87891 Personal history of nicotine dependence: Secondary | ICD-10-CM | POA: Diagnosis not present

## 2018-04-24 ENCOUNTER — Ambulatory Visit (HOSPITAL_BASED_OUTPATIENT_CLINIC_OR_DEPARTMENT_OTHER)
Admission: RE | Admit: 2018-04-24 | Discharge: 2018-04-24 | Disposition: A | Discharge status: Home / Self Care | Payer: Medicare Other | Source: Ambulatory Visit | Attending: Internal Medicine | Admitting: Internal Medicine

## 2018-04-24 ENCOUNTER — Encounter: Payer: Self-pay | Admitting: Internal Medicine

## 2018-04-24 ENCOUNTER — Ambulatory Visit (INDEPENDENT_AMBULATORY_CARE_PROVIDER_SITE_OTHER): Payer: Medicare Other | Admitting: Internal Medicine

## 2018-04-24 VITALS — BP 132/74 | HR 72 | Temp 97.4°F | Resp 14 | Ht 68.0 in | Wt 151.0 lb

## 2018-04-24 DIAGNOSIS — J9811 Atelectasis: Secondary | ICD-10-CM | POA: Insufficient documentation

## 2018-04-24 DIAGNOSIS — I517 Cardiomegaly: Secondary | ICD-10-CM | POA: Diagnosis not present

## 2018-04-24 DIAGNOSIS — I700 Atherosclerosis of aorta: Secondary | ICD-10-CM | POA: Diagnosis not present

## 2018-04-24 DIAGNOSIS — R531 Weakness: Secondary | ICD-10-CM | POA: Diagnosis not present

## 2018-04-24 DIAGNOSIS — J432 Centrilobular emphysema: Secondary | ICD-10-CM

## 2018-04-24 DIAGNOSIS — R269 Unspecified abnormalities of gait and mobility: Secondary | ICD-10-CM

## 2018-04-24 DIAGNOSIS — J441 Chronic obstructive pulmonary disease with (acute) exacerbation: Secondary | ICD-10-CM | POA: Diagnosis not present

## 2018-04-24 DIAGNOSIS — J984 Other disorders of lung: Secondary | ICD-10-CM | POA: Insufficient documentation

## 2018-04-24 DIAGNOSIS — N184 Chronic kidney disease, stage 4 (severe): Secondary | ICD-10-CM

## 2018-04-24 DIAGNOSIS — J90 Pleural effusion, not elsewhere classified: Secondary | ICD-10-CM | POA: Diagnosis not present

## 2018-04-24 DIAGNOSIS — R5381 Other malaise: Secondary | ICD-10-CM

## 2018-04-24 MED ORDER — AZITHROMYCIN 250 MG PO TABS: ORAL_TABLET | ORAL | 0 refills | Status: DC

## 2018-04-24 NOTE — Progress Notes (Signed)
Pre visit review using our clinic review tool, if applicable. No additional management support is needed unless otherwise documented below in the visit note. 

## 2018-04-24 NOTE — Progress Notes (Signed)
Subjective:    Patient ID: [name], male    DOB: 1927-01-02, 84 y.o.   MRN: [id number]  DOS:  04/24/2018 Type of visit - description : Routine visit Interval history: Here with his daughter, for routine visit but he is not feeling well. He started to feel weaker the last 5 or 6 days, yesterday the family noticed seem to be slightly worse, + wheezing, more chest congestion. Some runny nose and decreased appetite Today, they did some chest physical therapy, giving his nebulizations on for the first time they saw a large amount of thick, green mucus.  Review of Systems No fever chills No nausea, vomiting, diarrhea No chest pain or difficulty breathing.   Past Medical History:  Diagnosis Date  . Abdominal aortic aneurysm ([place]) 09/2008   s/o endovascular repair and angiogram 10/04/08   . Anemia    EGD Cscope 08-2012  . Arthritis    "hips" (11/23/2017)  . Atrial fibrillation ([place])   . Bleeding stomach ulcer 1990s  . Bleeding ulcer 1997  . CKD (chronic kidney disease), stage IV (HCC)    Stable, Dr. Dimas Aguas  . COPD (chronic obstructive pulmonary disease) ([place])    "mild" (11/23/2017)  . Dementia    "on very small dose of Aricept" (11/23/2017)  . H/O hyperkalemia    Stable  . Hypertension   . Leukopenia 2/11   thought to be from doxy  . Pneumonia 2018 X 2  . Renal cell carcinoma    clear cell type dx 10/09, s/p excision 10-24-08  . Renal insufficiency   . Solitary left kidney    W/ chronic tubule-interstitial damage, Dr. Dimas Aguas  . Spinal stenosis    causing LE weakness-persistant, being worked up by Hydrographic surveyor and neurologist    Past Surgical History:  Procedure Laterality Date  . ABDOMINAL AORTIC ANEURYSM REPAIR  2009   EVAR  . CATARACT EXTRACTION W/ INTRAOCULAR LENS  IMPLANT, BILATERAL Bilateral   . HEMORRHOID SURGERY  1990  . IR ANGIOGRAM PELVIS SELECTIVE OR SUPRASELECTIVE  11/24/2017  . IR ANGIOGRAM SELECTIVE EACH ADDITIONAL VESSEL  11/24/2017  .  IR ANGIOGRAM SELECTIVE EACH ADDITIONAL VESSEL  11/24/2017  . IR ANGIOGRAM SELECTIVE EACH ADDITIONAL VESSEL  11/24/2017  . IR ANGIOGRAM SELECTIVE EACH ADDITIONAL VESSEL  11/24/2017  . IR ANGIOGRAM VISCERAL SELECTIVE  11/24/2017  . IR ANGIOGRAM VISCERAL SELECTIVE  11/24/2017  . IR RADIOLOGIST EVAL & MGMT  09/07/2017  . IR US GUIDE VASC ACCESS RIGHT  11/24/2017  . [place] ([place]), 2004  . NEPHRECTOMY  2009   Open nephrectomy for cancer    Social History   Socioeconomic History  . Marital status: Widowed    Spouse name: Not on file  . Number of children: 3  . Years of education: Not on file  . Highest education level: Not on file  Occupational History  . Occupation: retired    Fish farm manager: RETIRED  Social Needs  . Financial resource strain: Not on file  . Food insecurity:    Worry: Not on file    Inability: Not on file  . Transportation needs:    Medical: Not on file    Non-medical: Not on file  Tobacco Use  . Smoking status: Former Smoker    Packs/day: 1.50    Years: 60.00    Pack years: 90.00    Last attempt to quit: 12/28/2007    Years since quitting: 10.3  . Smokeless tobacco: Never Used  Substance  and Sexual Activity  . Alcohol use: No    Alcohol/week: 0.0 oz  . Drug use: No  . Sexual activity: Not on file  Lifestyle  . Physical activity:    Days per week: Not on file    Minutes per session: Not on file  . Stress: Not on file  Relationships  . Social connections:    Talks on phone: Not on file    Gets together: Not on file    Attends religious service: Not on file    Active member of club or organization: Not on file    Attends meetings of clubs or organizations: Not on file    Relationship status: Not on file  . Intimate partner violence:    Fear of current or ex partner: Not on file    Emotionally abused: Not on file    Physically abused: Not on file    Forced sexual activity: Not on file  Other Topics Concern  . Not on file  Social History  Narrative   Lives by himself    Lost wife to dementia 10-2015   Totally independent on ADL , no driving   Often came to office w/ his daughter [name] [phone]), his other daughter is  [name] , son is Elizah                      Allergies as of 04/24/2018      Reactions   Iodinated Diagnostic Agents Other (See Comments)   Only one kidney    Ioxaglate Other (See Comments)   Only one kidney  Only one kidney    Baclofen Other (See Comments)   Unable to walk per Pt's family  Unable to walk per Pt's family  "turns him upside down"       Medication List        Accurate as of 04/24/18 11:59 PM. Always use your most recent med list.          aspirin EC 81 MG tablet Take 81 mg by mouth daily.   azithromycin 250 MG tablet Commonly known as:  ZITHROMAX Take 2 tablets by mouth first day, then 1 tablet for 4 additional days   budesonide 0.25 MG/2ML nebulizer solution Commonly known as:  PULMICORT Take 2 mLs (0.25 mg total) by nebulization 2 (two) times daily. Alternate w/ sodium chloride neb.   Carbidopa-Levodopa ER 25-100 MG tablet controlled release Commonly known as:  SINEMET CR Take 0.5 tablets by mouth 2 (two) times daily.   DEMADEX 20 MG tablet Generic drug:  torsemide Take 20 mg by mouth daily.   docusate sodium 100 MG capsule Commonly known as:  COLACE Take 100 mg by mouth daily.   donepezil 5 MG tablet Commonly known as:  ARICEPT Take 5 mg by mouth at bedtime.   feeding supplement (NEPRO CARB STEADY) Liqd Take 237 mLs by mouth daily.   hydrALAZINE 25 MG tablet Commonly known as:  APRESOLINE Take 1 tablet (25 mg total) by mouth 3 (three) times daily.   isosorbide mononitrate 60 MG 24 hr tablet Commonly known as:  IMDUR Take 60 mg by mouth daily.   patiromer 8.4 g packet Commonly known as:  VELTASSA Take as directed   prednisoLONE acetate 1 % ophthalmic suspension Commonly known as:  PRED FORTE Place 1 drop into both eyes 2 (two) times daily.     SE-TAN PLUS 162-115.2-1 MG Caps Take 1 capsule by mouth daily. Take 1 Tab  daily  sodium chloride 0.9 % nebulizer solution Take 3 mLs by nebulizer at 12 PM and at bedtime (alternate w/ Pulmicort neb).   tamsulosin 0.4 MG Caps capsule Commonly known as:  FLOMAX Take 1 capsule (0.4 mg total) by mouth daily after supper.   Vitamin D 2000 units Caps Take 2,000 Units by mouth daily.          Objective:   Physical Exam BP 132/74 (BP Location: Left Arm, Patient Position: Sitting, Cuff Size: Small)   Pulse 72   Temp (!) 97.4 F (36.3 C) (Oral)   Resp 14   Ht 5\' 8"  (1.727 m)   Wt 151 lb (68.5 kg)   SpO2 96%   BMI 22.96 kg/m   General:   Well developed, elderly gentleman, no distress.  No particularly weak appearing today HEENT:  Normocephalic . Face symmetric, atraumatic.  Throat symmetric, nose is slightly congested Lungs:  Scattered rhonchi, decreased breath sounds, worse on the left. Normal respiratory effort, no intercostal retractions, no accessory muscle use. Heart: seems regular today.  No pretibial edema bilaterally  Skin: Not pale. Not jaundice Neurologic:  alert & oriented X3.  Speech normal, gait not tested Psych--  Cognition and judgment appear intact.  Cooperative with normal attention span and concentration.  Behavior appropriate. No anxious or depressed appearing.      Assessment & Plan:    Assessment  Hypertension, COPD--former smoker,PFTs 8- 2013 mild obstruction otherwise normal. Asx  Paroxysmal A. fib DX 11/2017 Renal: (f/u @ BMI) --H/o Renal Cancer  --Solitary left kidney --Chronic renal insufficiency --hematuria: Dr Hazle Nordmann, cysto 01-2017 Neuro:   -on aricept per neuro, mild memory imparment -parkinson's   -neuropathy, saw neuro 06-2015, Dr Posey Pronto. As off 03-2016 is followed at [facility]:  +[name] gen neuro; +Dr Tonye Royalty now  Dr Hall Busing, movement d/o specialist   MSK: --DJD --Mild spinal stenosis, has seen  Dr Flavia Shipper Osteopenia--DEXA 07-2015,  osteopenia, rx vit D Pancytopenia: sees hem-onc (Dr Cruzita Lederer, [facility]) , 2011 felt to be from doxycycline. Last OV 09-13-2017 AAA s/p EVAR 2009 Acne rosacea Admitted 11-2017: Angiography, complicated by a respiratory failure, new onset of P- A. fib.   Plan:   COPD exacerbation: Suspect the diagnosis based on symptoms, we agreed to get a chest x-ray, start him on a Z-Pak, continue with chest percussions, nebulizations with steroids and saline to help secretions mobilization.  Definitely call if not gradually improving. Hematology visit reviewed: was eval for  1. Normochromic, normocytic anemia secondary to anemia of renal disease.  2. Mild thrombocytopenia, possibly autoimmune.  3. Leukopenia secondary to MDS vs autoimmune mechanism Renal failure, last visit with nephrology 03/30/2017, has a follow-up in few days according to the patient's daughter. Medications reviewed: He is taking Imdur, unclear why, apparently prescribed by nephrology, recommend to discuss with them. RTC 3 months    3-18 Fatigue, generalized weakness, question of upper and lower extremity pain: See last visit, sed rate and CK were normal.  Currently  weakness mostly in the LE; sx likely multifactorial.  rx observation for now COPD with difficult pulmonary secretions management: after  the last visit, the patient's daughter reported a lot of mucus and difficulty moving secretions, I recommended chest physical therapy and nebulizations with steroids and saline.  Was not able to get chest physical therapy.  Nebulizations providing  marginal help? At the end of a visit we agreed to continue saline nebulizations twice a day steroid nebulizations once daily. I taught  the daughter chest physical therapy Consider  pulmonary referral. Renal failure: Follow-up by nephrology closely Paroxysmal A. fib, CHF: Saw cardiology 02/08/2018, they agreed that given elevated BNP and sx he  has some degree of diastolic dysfunction. RTC 4-6  weeks

## 2018-04-24 NOTE — Patient Instructions (Signed)
  GO TO THE FRONT DESK Schedule your next appointment for a  Follow up in 3 months    STOP BY THE FIRST FLOOR:  get the XR    For your cough:  Continue with chest physical therapy  Continue with your reducing or Mucinex  Continue with your nebulizations  Take a Z-Pak  Call if not gradually better

## 2018-04-25 ENCOUNTER — Encounter: Payer: Self-pay | Admitting: Cardiology

## 2018-04-25 NOTE — Assessment & Plan Note (Signed)
COPD exacerbation: Suspect the diagnosis based on symptoms, we agreed to get a chest x-ray, start him on a Z-Pak, continue with chest percussions, nebulizations with steroids and saline to help secretions mobilization.  Definitely call if not gradually improving. Hematology visit reviewed: was eval for  1. Normochromic, normocytic anemia secondary to anemia of renal disease.  2. Mild thrombocytopenia, possibly autoimmune.  3. Leukopenia secondary to MDS vs autoimmune mechanism Renal failure, last visit with nephrology 03/30/2017, has a follow-up in few days according to the patient's daughter. Medications reviewed: He is taking Imdur, unclear why, apparently prescribed by nephrology, recommend to discuss with them. RTC 3 months

## 2018-05-03 ENCOUNTER — Ambulatory Visit: Payer: Medicare Other | Admitting: Internal Medicine

## 2018-05-05 ENCOUNTER — Telehealth: Payer: Self-pay | Admitting: Internal Medicine

## 2018-05-05 DIAGNOSIS — J432 Centrilobular emphysema: Secondary | ICD-10-CM

## 2018-05-05 NOTE — Telephone Encounter (Signed)
LMOM informing [name] of recommendations/referral to pulmonary. Instructed her to take Pt to ED/urgent care over weekend if he becomes worse, has fevers, chills, etc. If [name] returns call, okay for [facility] triage to discuss.

## 2018-05-05 NOTE — Telephone Encounter (Signed)
Please advise 

## 2018-05-05 NOTE — Telephone Encounter (Signed)
Advised patient, I do not have new suggestions. Next step is to see pulmonary, will as them to help with COPD and difficult secretions management.  Please arrange. If he gets worse, has fever or chills he needs to be seen.

## 2018-05-05 NOTE — Telephone Encounter (Signed)
Copied from [place] [id number]. Topic: Quick Communication - See Telephone Encounter >> May 05, 2018  3:12 PM R[place]ford Nail, [place] wrote: CRM for notification. See Telephone encounter for: 05/05/18. Patients daughter, [name], states that they finished the z-pac that was prescribed, but there is still a yellowish, thick congestion in her father's throat. States that he is still taking mucinex and breathing treatments. States that he is still coughing and gets strangled on the phlegm. Is there anything else that they could try to get the congestion out? States that he has had pneumonia several times within the last year and would like to get this congestion gone before it turns to that. [id number] - [place], [place] AT [facility] [address] [place] CB#: [phone]

## 2018-05-09 NOTE — Progress Notes (Signed)
Cardiology Office Note:    Date:  05/15/2018   ID:  [name], DOB 22-Oct-1927, MRN [id number]  PCP:  Colon Branch, MD  Cardiologist:  Candee Furbish, MD    Referring MD: Colon Branch, MD     History of Present Illness:    [name] is a 84 y.o. male with a hx of abdominal aortic aneurysm repair with Parkinson's disease who was hospitalized/discharged on 12/07/17 with debility secondary to acute respiratory failure and hypoxia, new onset paroxysmal atrial fibrillation, history of abdominal aortic aneurysm with no endoleak, occluded origin of inferior mesenteric artery.  Post procedure in interventional radiology became hypoxic, right lower lobe changes on x-ray.  He was then admitted.  He then went to rehab for several days.  Note from Dr. Trula Slade on 03/06/2018 reviewed.  Continues to feel quite weak.  Additional attempt at embolization would not be warranted given the complexity of the prior procedure.  No endoleak on ultrasound.  Outpatient rehab.  He is scheduled for another renal duplex ultrasound in 6 months.  He has chronic anemia and pancytopenia, single kidney, CKD.  Atrial fibrillation was noted post procedure, several hours duration.  Denied any symptoms of chest pain or shortness of breath.  His atrial fibrillation resolved during the hospitalization.  We discussed the potentials for anticoagulation previously however given his underlying thrombocytopenia this would be high risk for bleeding.  Risk outweighs benefits.  [name] is his daughter.  02/08/18 -since last visit, he is taken a turn for the worse.  He is quite weak, has significant difficulty getting up from his travel wheelchair.  He has had weight loss.  He was recently started on torsemide from nephrology for questionable fluid retention/heart failure.  Has had 2 rounds of antibiotics.  Legs were tight.  He was also started on isosorbide. TSH in December was normal.   05/15/2018- Dr. Stephens Shire note from 03/06/2018  reviewed.  There will be no future attempt at embolization.  Quite debilitated.  [name] has been working on COPD.  Nephrology has been following with him 03/30/2017 appointment noted.  Overall has been quite stable.  Breathing treatments.  No bleeding.  No chest pain.  Shortness of breath has been stable.  Lower extremity edema 2+ and stable.  Past Medical History:  Diagnosis Date  . Abdominal aortic aneurysm ([place]) 09/2008   s/o endovascular repair and angiogram 10/04/08   . Anemia    EGD Cscope 08-2012  . Arthritis    "hips" (11/23/2017)  . Atrial fibrillation ([place])   . Bleeding stomach ulcer 1990s  . Bleeding ulcer 1997  . CKD (chronic kidney disease), stage IV (HCC)    Stable, Dr. Dimas Aguas  . COPD (chronic obstructive pulmonary disease) ([place])    "mild" (11/23/2017)  . Dementia    "on very small dose of Aricept" (11/23/2017)  . H/O hyperkalemia    Stable  . Hypertension   . Leukopenia 2/11   thought to be from doxy  . Pneumonia 2018 X 2  . Renal cell carcinoma    clear cell type dx 10/09, s/p excision 10-24-08  . Renal insufficiency   . Solitary left kidney    W/ chronic tubule-interstitial damage, Dr. Dimas Aguas  . Spinal stenosis    causing LE weakness-persistant, being worked up by Hydrographic surveyor and neurologist    Past Surgical History:  Procedure Laterality Date  . ABDOMINAL AORTIC ANEURYSM REPAIR  2009   EVAR  . CATARACT EXTRACTION W/ INTRAOCULAR LENS  IMPLANT, BILATERAL Bilateral   . HEMORRHOID SURGERY  1990  . IR ANGIOGRAM PELVIS SELECTIVE OR SUPRASELECTIVE  11/24/2017  . IR ANGIOGRAM SELECTIVE EACH ADDITIONAL VESSEL  11/24/2017  . IR ANGIOGRAM SELECTIVE EACH ADDITIONAL VESSEL  11/24/2017  . IR ANGIOGRAM SELECTIVE EACH ADDITIONAL VESSEL  11/24/2017  . IR ANGIOGRAM SELECTIVE EACH ADDITIONAL VESSEL  11/24/2017  . IR ANGIOGRAM VISCERAL SELECTIVE  11/24/2017  . IR ANGIOGRAM VISCERAL SELECTIVE  11/24/2017  . IR RADIOLOGIST EVAL & MGMT  09/07/2017  . IR US GUIDE VASC  ACCESS RIGHT  11/24/2017  . [place] (lip), 2004  . NEPHRECTOMY  2009   Open nephrectomy for cancer    Current Medications: Current Meds  Medication Sig  . arformoterol (BROVANA) 15 MCG/2ML NEBU Take 2 mLs (15 mcg total) by nebulization 2 (two) times daily.  Marland Kitchen aspirin EC 81 MG tablet Take 81 mg by mouth daily.  . budesonide (PULMICORT) 0.25 MG/2ML nebulizer solution Take 2 mLs (0.25 mg total) by nebulization 2 (two) times daily. Alternate w/ sodium chloride neb.  . Carbidopa-Levodopa ER (SINEMET CR) 25-100 MG tablet controlled release Take 0.5 tablets by mouth 2 (two) times daily.  . Cholecalciferol (VITAMIN D) 2000 units CAPS Take 2,000 Units by mouth daily.  Marland Kitchen docusate sodium (COLACE) 100 MG capsule Take 100 mg by mouth daily.  Marland Kitchen donepezil (ARICEPT) 5 MG tablet Take 5 mg by mouth at bedtime.  . FeFum-FePo-FA-B Cmp-C-Zn-Mn-Cu (SE-TAN PLUS) 162-115.2-1 MG CAPS Take 1 capsule by mouth daily. Take 1 Tab  daily  . hydrALAZINE (APRESOLINE) 25 MG tablet Take 1 tablet (25 mg total) by mouth 3 (three) times daily.  . Nutritional Supplements (FEEDING SUPPLEMENT, NEPRO CARB STEADY,) LIQD Take 237 mLs by mouth daily.  . pantoprazole (PROTONIX) 40 MG tablet Take 40 mg by mouth daily.  . prednisoLONE acetate (PRED FORTE) 1 % ophthalmic suspension Place 1 drop into both eyes 2 (two) times daily.  Marland Kitchen PRESCRIPTION MEDICATION Take 10 g by mouth daily on Mondays and Fridays. [name])  . sodium chloride 0.9 % nebulizer solution Take 3 mLs by nebulizer at 12 PM and at bedtime (alternate w/ Pulmicort neb).  . tamsulosin (FLOMAX) 0.4 MG CAPS capsule Take 1 capsule (0.4 mg total) by mouth daily after supper.  . torsemide (DEMADEX) 20 MG tablet Take 20 mg by mouth daily.  . [DISCONTINUED] isosorbide mononitrate (IMDUR) 60 MG 24 hr tablet Take 60 mg by mouth daily.     Allergies:   Iodinated diagnostic agents; Ioxaglate; and Baclofen   Social History   Socioeconomic History  . Marital status:  Widowed    Spouse name: Not on file  . Number of children: 3  . Years of education: Not on file  . Highest education level: Not on file  Occupational History  . Occupation: retired    Fish farm manager: RETIRED  Social Needs  . Financial resource strain: Not on file  . Food insecurity:    Worry: Not on file    Inability: Not on file  . Transportation needs:    Medical: Not on file    Non-medical: Not on file  Tobacco Use  . Smoking status: Former Smoker    Packs/day: 1.50    Years: 60.00    Pack years: 90.00    Last attempt to quit: 12/28/2007    Years since quitting: 10.3  . Smokeless tobacco: Never Used  Substance and Sexual Activity  . Alcohol use: No    Alcohol/week: 0.0 oz  .  Drug use: No  . Sexual activity: Not on file  Lifestyle  . Physical activity:    Days per week: Not on file    Minutes per session: Not on file  . Stress: Not on file  Relationships  . Social connections:    Talks on phone: Not on file    Gets together: Not on file    Attends religious service: Not on file    Active member of club or organization: Not on file    Attends meetings of clubs or organizations: Not on file    Relationship status: Not on file  Other Topics Concern  . Not on file  Social History Narrative   Lives by himself    Lost wife to dementia 10-2015   Totally independent on ADL , no driving   Often came to office w/ his daughter [name] [phone]), his other daughter is  [name] , son is Hansen                     Family History: The patient's family history includes Diabetes in his father; Hypertension in his father; Stroke in his father. There is no history of Colon cancer or Prostate cancer. ROS:   Please see the history of present illness.   Debility noted. All other review of systems negative.  EKGs/Labs/Other Studies Reviewed:    The following studies were reviewed today:   ECHO 11/28/17 - Left ventricle: The cavity size was normal. There was severe   concentric  hypertrophy. Systolic function was normal. The   estimated ejection fraction was in the range of 55% to 60%. Wall   motion was normal; there were no regional wall motion   abnormalities. - Aortic valve: Transvalvular velocity was within the normal range.   There was no stenosis. There was mild regurgitation. - Mitral valve: Transvalvular velocity was within the normal range.   There was no evidence for stenosis. There was mild regurgitation. - Left atrium: The atrium was severely dilated. - Right ventricle: The cavity size was normal. Wall thickness was   normal. Systolic function was normal. - Tricuspid valve: There was mild regurgitation. - Pulmonary arteries: Systolic pressure was mildly increased. PA   peak pressure: 46 mm Hg (S). - Pericardium,   EKG:  EKG from 02/08/18-baseline is clear, P waves noted, sinus rhythm with PACs, right bundle branch block, left anterior fascicular block, PVCs noted.  02/06/18 - SR, artifact, 1st degree AVB with PAC's, RBBB personally viewed. Prior 11/27/17 - AFIB 86 RBBB  Recent Labs: 11/30/2017: ALT 9 01/31/2018: Hemoglobin 10.7; Platelets 88.0 02/06/2018: Brain Natriuretic Peptide 1,287; BUN 32; Creatinine, Ser 2.50; Potassium 4.6; Sodium 145 02/08/2018: TSH 4.110  Recent Lipid Panel    Component Value Date/Time   CHOL 188 05/12/2017 0909   TRIG 78.0 05/12/2017 0909   HDL 50.60 05/12/2017 0909   CHOLHDL 4 05/12/2017 0909   VLDL 15.6 05/12/2017 0909   LDLCALC 122 (H) 05/12/2017 0909   LDLDIRECT 134.4 10/19/2011 0932    Physical Exam:    VS:  BP (!) 142/70   Pulse 67   Ht 5\' 7"  (1.702 m)   Wt 159 lb 12.8 oz (72.5 kg)   BMI 25.03 kg/m     Wt Readings from Last 3 Encounters:  05/15/18 159 lb 12.8 oz (72.5 kg)  04/24/18 151 lb (68.5 kg)  03/13/18 148 lb (67.1 kg)     GEN: Frail, in no acute distress, in wheelchair transport chair HEENT: normal  Neck: no JVD, carotid bruits, or masses Cardiac: RRR; no murmurs, rubs, or gallops, 2+ edema    Respiratory:  clear to auscultation bilaterally, normal work of breathing GI: soft, nontender, nondistended, + BS MS: no deformity or atrophy  Skin: warm and dry, no rash Neuro:  Alert and Oriented x 3, Strength and sensation are intact Psych: euthymic mood, full affect    ASSESSMENT:    1. PAF (paroxysmal atrial fibrillation) ([place])   2. Weakness   3. AAA (abdominal aortic aneurysm) without rupture ([place])   4. Aortic atherosclerosis ([place])   5. Chronic kidney disease, stage IV (severe) (HCC)   6. Parkinson's disease ([place])    PLAN:    In order of problems listed above:  Chronic diastolic heart failure with superimposed COPD -Certainly his difficult to resolve cough with elevated BNP could have been related to increased intravascular volume.  Legs were described as edematous, L>R.  Currently on torsemide initiated by nephrology.  Creatinine 2.5.  Certainly given his advanced age, he has a degree of diastolic dysfunction/stiffness.  His last chest x-ray on 04/24/2018 showed no overt evidence of CHF.  No evidence of pneumonia, stable right pleural effusion small.  Been doing well with breathing treatments up to 6 times a day.  Paroxysmal atrial fibrillation -Postprocedural in interventional radiology.  Subsequently autoconverted.  We had prior lengthy discussion about risks and benefits of anticoagulation and given his chronic thrombocytopenia, advanced age we decided not to anticoagulate during his hospital stay in early December.  He has had subsequent visit with hematology.  Hopefully he will continue to maintain sinus rhythm.  If atrial fibrillation recurs, he is at risk for stroke.  His hematologist stated that he could be on low-dose aspirin 81 mg a day.  He is going to continue with this.  We discussed that a trial data previously that aspirin does not reduce stroke risk in atrial fibrillation.  They understand however risks outweigh benefits.  Repeat ECG, 02/08/18 clearly shows P waves,  no evidence of atrial fibrillation.  Reassuring.  Essential hypertension -Hydralazine, torsemide.  I will stop his Imdur 60 mg.  One can always increase his hydralazine if necessary for hypertension.  AAA repair  - IR. Dr. Trula Slade monitoring. EVAR 2009.  Last office note reviewed as above.  Thoracic aortic atherosclerosis - Continue with secondary prevention.  CKD 4  - BNP challenging to interpret in this setting.  Continuing to coordinate with nephrology to optimize his fluid management.  Parkinson's disease - On carbidopa levodopa, per [name]  Dementia -On Aricept  Thrombocytopenia - Platelet count last check was 88,000 on 01/31/18 down from 133 in December 2018.  [name], his daughter, understands the plan.  Medication Adjustments/Labs and Tests Ordered: Current medicines are reviewed at length with the patient today.  Concerns regarding medicines are outlined above.  No orders of the defined types were placed in this encounter.  No orders of the defined types were placed in this encounter.   Signed, Candee Furbish, MD  05/15/2018 8:41 AM    [facility]

## 2018-05-09 NOTE — Telephone Encounter (Signed)
He will need: Brovana 1 nebulization twice a day.  Send prescription Continue Pulmicort twice a day as before Continue to  use saline nebulizations twice a day as needed. Please let me know next week how he is doing

## 2018-05-09 NOTE — Telephone Encounter (Signed)
Addendum: LMOM for [name], before we refer him to pulmonary we could try to add another nebulization. If she is interested she will call back and we can send a prescription for Brovana, 2 mL's, 1 nebulization twice a day.

## 2018-05-09 NOTE — Telephone Encounter (Signed)
[name] calling back to confirm that they would like the prescription for Brovana, and to discuss what the frequency would be? He is already doing 3 nebulizations a day between sodium chloride solution and the medicine.  [name] wants to know if there is anything additional that [name] needs from her? She says she only had one missed call from 05/10.

## 2018-05-10 DIAGNOSIS — N183 Chronic kidney disease, stage 3 (moderate): Secondary | ICD-10-CM | POA: Diagnosis not present

## 2018-05-10 DIAGNOSIS — I10 Essential (primary) hypertension: Secondary | ICD-10-CM | POA: Diagnosis not present

## 2018-05-10 MED ORDER — ARFORMOTEROL TARTRATE 15 MCG/2ML IN NEBU: 15.0000 ug | INHALATION_SOLUTION | Freq: Two times a day (BID) | RESPIRATORY_TRACT | 5 refills | Status: AC

## 2018-05-10 NOTE — Addendum Note (Signed)
Addended byDamita Dunnings D on: 05/10/2018 07:33 AM   Modules accepted: Orders

## 2018-05-10 NOTE — Telephone Encounter (Signed)
Brovana neb sent to [facility].

## 2018-05-11 NOTE — Telephone Encounter (Signed)
[name] calling back because the pharmacy says the Garlon Hatchet is not covered by Medicare and it will cost over $1000. Please send an alternate medication.  She would like a call back because she still has not received a call to explain what the new instructions are for the patient's medications. She would like a call back from [facility] as soon as possible.

## 2018-05-12 NOTE — Telephone Encounter (Signed)
Also, Pt scheduled w/ Dr. Melvyn Novas 05/15/2018.

## 2018-05-12 NOTE — Telephone Encounter (Signed)
I have tried calling [name]- left message for call back- anytime I call she never answers- will send to [name] for when he returns on Monday.

## 2018-05-15 ENCOUNTER — Ambulatory Visit (INDEPENDENT_AMBULATORY_CARE_PROVIDER_SITE_OTHER): Payer: Medicare Other | Admitting: Cardiology

## 2018-05-15 ENCOUNTER — Encounter: Payer: Self-pay | Admitting: Cardiology

## 2018-05-15 ENCOUNTER — Encounter: Payer: Self-pay | Admitting: Internal Medicine

## 2018-05-15 ENCOUNTER — Ambulatory Visit (INDEPENDENT_AMBULATORY_CARE_PROVIDER_SITE_OTHER): Payer: Medicare Other | Admitting: Internal Medicine

## 2018-05-15 VITALS — BP 142/70 | HR 67 | Ht 67.0 in | Wt 159.8 lb

## 2018-05-15 VITALS — BP 136/76 | HR 65 | Ht 69.0 in | Wt 159.6 lb

## 2018-05-15 DIAGNOSIS — N184 Chronic kidney disease, stage 4 (severe): Secondary | ICD-10-CM | POA: Diagnosis not present

## 2018-05-15 DIAGNOSIS — J449 Chronic obstructive pulmonary disease, unspecified: Secondary | ICD-10-CM

## 2018-05-15 DIAGNOSIS — R531 Weakness: Secondary | ICD-10-CM | POA: Diagnosis not present

## 2018-05-15 DIAGNOSIS — I480 Paroxysmal atrial fibrillation: Secondary | ICD-10-CM | POA: Diagnosis not present

## 2018-05-15 DIAGNOSIS — I700 Atherosclerosis of aorta: Secondary | ICD-10-CM | POA: Diagnosis not present

## 2018-05-15 DIAGNOSIS — I714 Abdominal aortic aneurysm, without rupture, unspecified: Secondary | ICD-10-CM

## 2018-05-15 DIAGNOSIS — G20 Parkinson's disease: Secondary | ICD-10-CM

## 2018-05-15 NOTE — Patient Instructions (Signed)
Medication Instructions:  Please discontinue your Imdur (Isosorbide).  Continue all other medications as listed.  Follow-Up: Follow up in 6 months with Dr. Marlou Porch.  You will receive a letter in the mail 2 months before you are due.  Please call us when you receive this letter to schedule your follow up appointment.  If you need a refill on your cardiac medications before your next appointment, please call your pharmacy.  Thank you for choosing [facility]!!

## 2018-05-15 NOTE — Telephone Encounter (Signed)
To see pulmonary today

## 2018-05-15 NOTE — Progress Notes (Signed)
Subjective:     Patient ID: [name], male   DOB: 05-09-1927     MRN: [id number]  HPI  60 yowm quit smoking 2009 very active including treadmill ex as recently November 2018 outpt rehab late dec and early Jan 2019 and lives on his own with lots of help from his daughter concerned re recurrent pna s/p admit:  DATE OF ADMISSION:  11/29/2017 DATE OF DISCHARGE:  12/07/2017   DISCHARGE DIAGNOSES: 1. Debility secondary to acute respiratory failure with hypoxia. 2. Sequential compression devices for deep vein thrombosis     prophylaxis. 3. Pain management. 4. Mood with memory loss. 5. New onset atrial fibrillation. 6. Acute on chronic kidney disease. 7. History of abdominal aortic aneurysm with concern for endoleak. 8. History of renal cell carcinoma. 9. Solitary kidney. 10.Anemia of chronic disease. 11.Pancytopenia. 12.Thrombocytopenia. 13.History of Parkinson disease. 14.Hypertension. 15.Decreased nutritional storage. 16.Pneumonia. 17.Urinary frequency.      05/15/2018 1st [facility] office visit/ Wert   GOLD I copd  Chief Complaint  Patient presents with  . Pulmonary Consult    Referred by [name]. Pt states he has had PNA and "congestion" multiple times over the past year. He is coughing up clear sputum, worse in the am's.    presently limited by weak legs not breathing  Most of the coughing is daytime esp p wakes  Maybe a tbsp of clear mucus / no relation to meals  Has flutter valve uses some   No obvious day to day or daytime variability or assoc excess/ purulent sputum or mucus plugs or hemoptysis or cp or chest tightness, subjective wheeze or overt sinus or hb symptoms. No unusual exposure hx or h/o childhood pna/ asthma or knowledge of premature birth.  Sleeping propped up 1- 2 pillows  without nocturnal  or early am exacerbation  of respiratory  c/o's or need for noct saba. Also denies any obvious fluctuation of symptoms with weather or environmental  changes or other aggravating or alleviating factors except as outlined above   Current Allergies, Complete Past Medical History, Past Surgical History, Family History, and Social History were reviewed in [employer] [employer] record.  ROS  The following are not active complaints unless bolded Hoarseness, sore throat, dysphagia, dental problems, itching, sneezing,  nasal congestion or discharge of excess mucus or purulent secretions, ear ache,   fever, chills, sweats, unintended wt loss or wt gain, classically pleuritic or exertional cp,  orthopnea pnd or arm/hand swelling  or leg swelling, presyncope, palpitations, abdominal pain, anorexia, nausea, vomiting, diarrhea  or change in bowel habits or change in bladder habits, change in stools or change in urine, dysuria, hematuria,  rash, arthralgias, visual complaints, headache, numbness, weakness or ataxia or problems with walking or coordination,  change in mood or  memory.        Current Meds  Medication Sig  . aspirin EC 81 MG tablet Take 81 mg by mouth daily.  . budesonide (PULMICORT) 0.25 MG/2ML nebulizer solution Take 2 mLs (0.25 mg total) by nebulization 2 (two) times daily. Alternate w/ sodium chloride neb.  . Carbidopa-Levodopa ER (SINEMET CR) 25-100 MG tablet controlled release Take 0.5 tablets by mouth 2 (two) times daily.  . Cholecalciferol (VITAMIN D) 2000 units CAPS Take 2,000 Units by mouth daily.  Marland Kitchen docusate sodium (COLACE) 100 MG capsule Take 100 mg by mouth daily.  Marland Kitchen donepezil (ARICEPT) 5 MG tablet Take 5 mg by mouth at bedtime.  . FeFum-FePo-FA-B Cmp-C-Zn-Mn-Cu (SE-TAN PLUS) 355-732.2-0  MG CAPS Take 1 capsule by mouth daily. Take 1 Tab  daily  . hydrALAZINE (APRESOLINE) 25 MG tablet Take 1 tablet (25 mg total) by mouth 3 (three) times daily.  . Nutritional Supplements (FEEDING SUPPLEMENT, NEPRO CARB STEADY,) LIQD Take 237 mLs by mouth daily.  . pantoprazole (PROTONIX) 40 MG tablet Take 40 mg by mouth daily.  .  prednisoLONE acetate (PRED FORTE) 1 % ophthalmic suspension Place 1 drop into both eyes 2 (two) times daily.  Marland Kitchen PRESCRIPTION MEDICATION Take 10 g by mouth daily on Mondays and Fridays. [facility])  . sodium chloride 0.9 % nebulizer solution Take 3 mLs by nebulizer at 12 PM and at bedtime (alternate w/ Pulmicort neb).  . tamsulosin (FLOMAX) 0.4 MG CAPS capsule Take 1 capsule (0.4 mg total) by mouth daily after supper.  . torsemide (DEMADEX) 20 MG tablet Take 20 mg by mouth daily.           Review of Systems     Objective:   Physical Exam    wc bound chronically wm rattly cough    Wt Readings from Last 3 Encounters:  05/15/18 159 lb 9.6 oz (72.4 kg)  05/15/18 159 lb 12.8 oz (72.5 kg)  04/24/18 151 lb (68.5 kg)     Vital signs reviewed - Note on arrival 02 sats  93% on RA         HEENT: nl   turbinates bilaterally, and oropharynx. Nl external ear canals without cough reflex - edentulous    NECK :  without JVD/Nodes/TM/ nl carotid upstrokes bilaterally   LUNGS: no acc muscle use,  Nl contour chest which is clear to A and P bilaterally without cough on insp or exp maneuvers   CV:  RRR  no s3 or murmur or increase in P2, and  2+ pitting both le's  ABD:  soft and nontender with nl inspiratory excursion in the supine position. No bruits or organomegaly appreciated, bowel sounds nl  MS:    ext warm without deformities, calf tenderness, cyanosis or clubbing No obvious joint restrictions   SKIN: warm and dry without lesions    NEURO: slow responses to verbal/  with  no motor or cerebellar deficits apparent.     I personally reviewed images and agree with radiology impression as follows:  CXR:   04/25/18 Mild cardiomegaly without overt evidence of CHF today. No evidence of pneumonia. Stable small right pleural effusion. Stable bibasilar atelectasis or scarring.    Assessment:

## 2018-05-15 NOTE — Patient Instructions (Addendum)
B rovana/ budesonide twice daily in nebulizer   Continue to use the flutter valve and mucinex  Or mucinex dm up to 1200 mg twice daily   Protonix should be Take 30-60 min before first meal of the day  And pepcid 20 mg at bedtime   GERD (REFLUX)  is an extremely common cause of respiratory symptoms just like yours , many times with no obvious heartburn at all.    It can be treated with medication, but also with lifestyle changes including elevation of the head of your bed (ideally with 6 inch  bed blocks),  Smoking cessation, avoidance of late meals, excessive alcohol, and avoid fatty foods, chocolate, peppermint, colas, red wine, and acidic juices such as orange juice.  NO MINT OR MENTHOL PRODUCTS SO NO COUGH DROPS   USE SUGARLESS CANDY INSTEAD (Jolley ranchers or Stover's or Life Savers) or even ice chips will also do - the key is to swallow to prevent all throat clearing. NO OIL BASED VITAMINS - use powdered substitutes.    If you are satisfied with your treatment plan,  let your doctor know and he/she can either refill your medications or you can return here when your prescription runs out.     If in any way you are not 100% satisfied,  please tell us.  If 100% better, tell your friends!  Pulmonary follow up is as needed

## 2018-05-16 ENCOUNTER — Encounter: Payer: Self-pay | Admitting: Internal Medicine

## 2018-05-16 DIAGNOSIS — N390 Urinary tract infection, site not specified: Secondary | ICD-10-CM | POA: Diagnosis not present

## 2018-05-16 DIAGNOSIS — R5383 Other fatigue: Secondary | ICD-10-CM | POA: Diagnosis not present

## 2018-05-16 DIAGNOSIS — R269 Unspecified abnormalities of gait and mobility: Secondary | ICD-10-CM | POA: Diagnosis not present

## 2018-05-16 DIAGNOSIS — R29898 Other symptoms and signs involving the musculoskeletal system: Secondary | ICD-10-CM | POA: Diagnosis not present

## 2018-05-16 DIAGNOSIS — J168 Pneumonia due to other specified infectious organisms: Secondary | ICD-10-CM | POA: Diagnosis not present

## 2018-05-16 DIAGNOSIS — R0989 Other specified symptoms and signs involving the circulatory and respiratory systems: Secondary | ICD-10-CM | POA: Diagnosis not present

## 2018-05-16 DIAGNOSIS — Z888 Allergy status to other drugs, medicaments and biological substances status: Secondary | ICD-10-CM | POA: Diagnosis not present

## 2018-05-16 DIAGNOSIS — R918 Other nonspecific abnormal finding of lung field: Secondary | ICD-10-CM | POA: Diagnosis not present

## 2018-05-16 DIAGNOSIS — N184 Chronic kidney disease, stage 4 (severe): Secondary | ICD-10-CM | POA: Diagnosis not present

## 2018-05-16 DIAGNOSIS — J189 Pneumonia, unspecified organism: Secondary | ICD-10-CM | POA: Diagnosis not present

## 2018-05-16 DIAGNOSIS — R2240 Localized swelling, mass and lump, unspecified lower limb: Secondary | ICD-10-CM | POA: Diagnosis not present

## 2018-05-16 DIAGNOSIS — I10 Essential (primary) hypertension: Secondary | ICD-10-CM | POA: Diagnosis not present

## 2018-05-16 DIAGNOSIS — Z87891 Personal history of nicotine dependence: Secondary | ICD-10-CM | POA: Diagnosis not present

## 2018-05-16 DIAGNOSIS — J181 Lobar pneumonia, unspecified organism: Secondary | ICD-10-CM | POA: Diagnosis not present

## 2018-05-16 DIAGNOSIS — R531 Weakness: Secondary | ICD-10-CM | POA: Diagnosis not present

## 2018-05-16 DIAGNOSIS — R600 Localized edema: Secondary | ICD-10-CM | POA: Diagnosis not present

## 2018-05-16 DIAGNOSIS — I700 Atherosclerosis of aorta: Secondary | ICD-10-CM | POA: Diagnosis not present

## 2018-05-16 DIAGNOSIS — I493 Ventricular premature depolarization: Secondary | ICD-10-CM | POA: Diagnosis not present

## 2018-05-16 NOTE — Assessment & Plan Note (Addendum)
Spirometry 05/15/2018  FEV1 2.43 (85%)  Ratio 66 with mild curvature on ? Prior  - 05/15/2018 maint on bud/formoterol    Between his copd and Parkinson's. Vol overload from CRI  and geriatric decline he has poor ventilatory reserve and  cough mechanics and would be at high risk of aspiration pna or any condition that further challenges  mucociliary function eg viral uri / sinusitis but really nothing else to offer at this point that Dr Larose Kells is not already doing.    Pulmonary f/u can be for acute flares to try try to prevent hospitalizations in the future.     Total time devoted to counseling  > 50 % of initial 60 min office visit:  review case with pt/daughter discussion of options/alternatives/ personally creating written customized instructions  in presence of pt  then going over those specific  Instructions directly with the pt including how to use all of the meds but in particular covering each new medication in detail and the difference between the maintenance= "automatic" meds and the prns using an action plan format for the latter (If this problem/symptom => do that organization reading Left to right).  Please see AVS from this visit for a full list of these instructions which I personally wrote for this pt and  are unique to this visit.

## 2018-06-01 ENCOUNTER — Telehealth: Payer: Self-pay | Admitting: Internal Medicine

## 2018-06-01 DIAGNOSIS — G20 Parkinson's disease: Secondary | ICD-10-CM | POA: Diagnosis not present

## 2018-06-01 DIAGNOSIS — Z8701 Personal history of pneumonia (recurrent): Secondary | ICD-10-CM | POA: Diagnosis not present

## 2018-06-01 DIAGNOSIS — J449 Chronic obstructive pulmonary disease, unspecified: Secondary | ICD-10-CM

## 2018-06-01 DIAGNOSIS — R69 Illness, unspecified: Secondary | ICD-10-CM | POA: Diagnosis not present

## 2018-06-01 DIAGNOSIS — Z79899 Other long term (current) drug therapy: Secondary | ICD-10-CM | POA: Diagnosis not present

## 2018-06-01 DIAGNOSIS — Z87891 Personal history of nicotine dependence: Secondary | ICD-10-CM | POA: Diagnosis not present

## 2018-06-01 NOTE — Telephone Encounter (Signed)
Spoke with patient's daughter. She stated that the patient was seen by a movement specialist at [facility] today. His O2 was 89-90%. He is still suffering from SOB and a non-productive cough. Increased fatigue. [name] stated that patient was seen at [facility] on May 21st. He was prescribed Amox. He has finished that RX. He has been doing his breathing treatments and using the flutter valve with no relief. Also has worsening kidney function.   Pharmacy is [facility] in [place].   MW, please advise. Thanks!

## 2018-06-01 NOTE — Telephone Encounter (Signed)
He is already on max rx so needs ov if they can get him in tomorrow - ok to add at end of day  The only thing we could add now would be the vest but I don't know how quickly we could get it to him   The paperwork needs to say   dx is neuomuscular weakness  No care giver available to administer effective chest physiotherapy Has not responded to alternative therapy - pt has flutter valve and used it but it did not effectively mobilize lung secretions  pt  reports productive cough > 6 m duration or 3 exac last 12 months requiring antibiotics .  Pt would benefit from trial of VEST therapy at this point.    Request smart vest.  Only other option if getting worse is admit

## 2018-06-01 NOTE — Telephone Encounter (Signed)
Called and spoke to pt's daughter [name].  [name] feels that pt needs an abx. Apt has been scheduled for 06/02/18 at 2:45p. [name] also would like to proceed with smart vest. [name] would like to ensure that pt will be prescribed an abx during tomorrow's acute visit. Smart vest has been ordered.  MW please advise. Thanks

## 2018-06-02 ENCOUNTER — Ambulatory Visit (INDEPENDENT_AMBULATORY_CARE_PROVIDER_SITE_OTHER): Payer: Medicare Other | Admitting: Internal Medicine

## 2018-06-02 ENCOUNTER — Ambulatory Visit (INDEPENDENT_AMBULATORY_CARE_PROVIDER_SITE_OTHER)
Admission: RE | Admit: 2018-06-02 | Discharge: 2018-06-02 | Disposition: A | Discharge status: Home / Self Care | Payer: Medicare Other | Source: Ambulatory Visit | Attending: Internal Medicine | Admitting: Internal Medicine

## 2018-06-02 ENCOUNTER — Encounter: Payer: Self-pay | Admitting: Internal Medicine

## 2018-06-02 VITALS — BP 128/62 | HR 69 | Temp 98.6°F | Ht 69.0 in | Wt 159.0 lb

## 2018-06-02 DIAGNOSIS — G20 Parkinson's disease: Secondary | ICD-10-CM

## 2018-06-02 DIAGNOSIS — R918 Other nonspecific abnormal finding of lung field: Secondary | ICD-10-CM

## 2018-06-02 DIAGNOSIS — J449 Chronic obstructive pulmonary disease, unspecified: Secondary | ICD-10-CM | POA: Diagnosis not present

## 2018-06-02 DIAGNOSIS — R05 Cough: Secondary | ICD-10-CM | POA: Diagnosis not present

## 2018-06-02 MED ORDER — LEVOFLOXACIN 500 MG PO TABS: 500.0000 mg | ORAL_TABLET | Freq: Every day | ORAL | 11 refills | Status: AC

## 2018-06-02 NOTE — Progress Notes (Signed)
Subjective:     Patient ID: [name], male   DOB: [birth year]     MRN: [id number]  HPI  29 yowm quit smoking 2009 very active including treadmill ex as recently November 2018 outpt rehab late dec and early Jan 2019 and lives on his own with lots of help from his daughter concerned re recurrent pna s/p admit:  DATE OF ADMISSION:  11/29/2017 DATE OF DISCHARGE:  12/07/2017   DISCHARGE DIAGNOSES: 1. Debility secondary to acute respiratory failure with hypoxia. 2. Sequential compression devices for deep vein thrombosis     prophylaxis. 3. Pain management. 4. Mood with memory loss. 5. New onset atrial fibrillation. 6. Acute on chronic kidney disease. 7. History of abdominal aortic aneurysm with concern for endoleak. 8. History of renal cell carcinoma. 9. Solitary kidney. 10.Anemia of chronic disease. 11.Pancytopenia. 12.Thrombocytopenia. 13.History of Parkinson disease. 14.Hypertension. 15.Decreased nutritional storage. 16.Pneumonia. 17.Urinary frequency.      05/15/2018 1st [facility] office visit/ Maudine Kluesner   GOLD I copd  Chief Complaint  Patient presents with  . Pulmonary Consult    Referred by [name]. Pt states he has had PNA and "congestion" multiple times over the past year. He is coughing up clear sputum, worse in the am's.    presently limited by weak legs not breathing  Most of the coughing is daytime esp p wakes  Maybe a tbsp of clear mucus / no relation to meals  Has flutter valve uses some  rec Brovana/ budesonide twice daily in nebulizer  Continue to use the flutter valve and mucinex  Or mucinex dm up to 1200 mg twice daily  Protonix should be Take 30-60 min before first meal of the day  And pepcid 20 mg at bedtime        06/02/2018 acute extended ov/Kyelle Urbas re:  Worse cough/ congestion / gen wakness  Chief Complaint  Patient presents with  . Acute Visit    Pt c/o weakness since the last visit. He is coughing up sputum but unable to cough it out. He  is sleepy all of the time.   gradually worse since last ov with more cough/ congestion/  just finished amox 05/30/18 for ? pna  and no better though no def fever or purulent sputum or cp   No obvious day to day or daytime variability or assoc excess/ purulent sputum or mucus plugs or hemoptysis or cp or chest tightness, subjective wheeze or overt sinus or hb symptoms. No unusual exposure hx or h/o childhood pna/ asthma or knowledge of premature birth.  Sleeping ok  without nocturnal  or early am exacerbation  of respiratory  c/o's or need for noct saba. Also denies any obvious fluctuation of symptoms with weather or environmental changes or other aggravating or alleviating factors except as outlined above   Current Allergies, Complete Past Medical History, Past Surgical History, Family History, and Social History were reviewed in [employer] record.  ROS  The following are not active complaints unless bolded Hoarseness, sore throat, dysphagia, dental problems, itching, sneezing,  nasal congestion or discharge of excess mucus or purulent secretions, ear ache,   fever, chills, sweats, unintended wt loss or wt gain, classically pleuritic or exertional cp,  orthopnea pnd or arm/hand swelling  or leg swelling, presyncope, palpitations, abdominal pain, anorexia, nausea, vomiting, diarrhea  or change in bowel habits or change in bladder habits, change in stools or change in urine, dysuria, hematuria,  rash, arthralgias, visual complaints, headache, numbness, weakness or  ataxia or problems with walking or coordination,  change in mood or  memory.        Current Meds  Medication Sig  . arformoterol (BROVANA) 15 MCG/2ML NEBU Take 2 mLs (15 mcg total) by nebulization 2 (two) times daily.  Marland Kitchen aspirin EC 81 MG tablet Take 81 mg by mouth daily.  . budesonide (PULMICORT) 0.25 MG/2ML nebulizer solution Take 2 mLs (0.25 mg total) by nebulization 2 (two) times daily. Alternate w/ sodium chloride  neb.  . Carbidopa-Levodopa ER (SINEMET CR) 25-100 MG tablet controlled release Take 0.5 tablets by mouth 2 (two) times daily.  . Cholecalciferol (VITAMIN D) 2000 units CAPS Take 2,000 Units by mouth daily.  Marland Kitchen docusate sodium (COLACE) 100 MG capsule Take 100 mg by mouth daily.  Marland Kitchen donepezil (ARICEPT) 5 MG tablet Take 5 mg by mouth at bedtime.  . FeFum-FePo-FA-B Cmp-C-Zn-Mn-Cu (SE-TAN PLUS) 162-115.2-1 MG CAPS Take 1 capsule by mouth daily. Take 1 Tab  daily  . hydrALAZINE (APRESOLINE) 25 MG tablet Take 1 tablet (25 mg total) by mouth 3 (three) times daily.  . isosorbide mononitrate (IMDUR) 60 MG 24 hr tablet Take 60 mg by mouth daily.  . Nutritional Supplements (FEEDING SUPPLEMENT, NEPRO CARB STEADY,) LIQD Take 237 mLs by mouth daily.  . pantoprazole (PROTONIX) 40 MG tablet Take 40 mg by mouth daily.  . prednisoLONE acetate (PRED FORTE) 1 % ophthalmic suspension Place 1 drop into both eyes 2 (two) times daily.  Marland Kitchen PRESCRIPTION MEDICATION Take 10 g by mouth daily on Mondays and Fridays. [facility])  . tamsulosin (FLOMAX) 0.4 MG CAPS capsule Take 1 capsule (0.4 mg total) by mouth daily after supper.  . torsemide (DEMADEX) 20 MG tablet Take 20 mg by mouth daily.              Objective:   Physical Exam  W/c bound elderly wm congested rattling cough    06/02/2018          159   05/15/18 159 lb 9.6 oz (72.4 kg)  05/15/18 159 lb 12.8 oz (72.5 kg)  04/24/18 151 lb (68.5 kg)     Vital signs reviewed - Note on arrival 02 sats  91% on RA         HEENT: nl  turbinates bilaterally, and oropharynx. Nl external ear canals without cough reflex - edentulous    NECK :  without JVD/Nodes/TM/ nl carotid upstrokes bilaterally   LUNGS: no acc muscle use,  Nl contour chest with coarse insp/exp rhonchi bilaterally without cough on insp or exp maneuvers   CV:  RRR  no s3 or murmur or increase in P2, and 1+ pitting  edema sym  ABD:  soft and nontender with nl inspiratory excursion in the supine  position. No bruits or organomegaly appreciated, bowel sounds nl  MS:    ext warm without deformities, calf tenderness, cyanosis or clubbing No obvious joint restrictions   SKIN: warm and dry without lesions    NEURO:  alert, approp, nl sensorium with  no motor or cerebellar deficits apparent.        CXR PA and Lateral:   06/02/2018 :    I personally reviewed images and agree with radiology impression as follows:   Stable chronic findings. No acute cardiopulmonary process identified.     Assessment:

## 2018-06-02 NOTE — Telephone Encounter (Signed)
See ov 06/02/2018

## 2018-06-02 NOTE — Patient Instructions (Addendum)
If mucus gets nasty or  runs fever > levaquin 500 mg x 10 days  Please see patient coordinator before you leave today  to schedule overnight ulse oximetry on Room Air and check on your VEST   Please remember to go to the  x-ray department downstairs in the basement  for your tests - we will call you with the results when they are available.

## 2018-06-05 ENCOUNTER — Other Ambulatory Visit: Payer: Self-pay

## 2018-06-05 ENCOUNTER — Ambulatory Visit: Payer: Medicare Other | Attending: Internal Medicine | Admitting: Physical Therapy

## 2018-06-05 DIAGNOSIS — R2681 Unsteadiness on feet: Secondary | ICD-10-CM

## 2018-06-05 DIAGNOSIS — M6281 Muscle weakness (generalized): Secondary | ICD-10-CM | POA: Diagnosis not present

## 2018-06-05 DIAGNOSIS — R262 Difficulty in walking, not elsewhere classified: Secondary | ICD-10-CM | POA: Diagnosis not present

## 2018-06-05 DIAGNOSIS — R293 Abnormal posture: Secondary | ICD-10-CM

## 2018-06-05 DIAGNOSIS — R2689 Other abnormalities of gait and mobility: Secondary | ICD-10-CM | POA: Insufficient documentation

## 2018-06-05 NOTE — Therapy (Signed)
[facility] [address]  [place], [place], [address] Phone: [phone]   Fax:  [phone]  Physical Therapy Evaluation  Patient Details  Name: JONATAN WILSEY MRN: [id number] Date of Birth: 1927/11/25 Referring Provider: Kathlene November, MD   Encounter Date: 06/05/2018  PT End of Session - 06/05/18 1440    Visit Number  1    Number of Visits  16    Date for PT Re-Evaluation  08/04/18    Authorization Type  Medicare & BCBS    PT Start Time  1440    PT Stop Time  1532    PT Time Calculation (min)  52 min    Activity Tolerance  Patient tolerated treatment well;Patient limited by fatigue    Behavior During Therapy  [facility] for tasks assessed/performed       Past Medical History:  Diagnosis Date  . Abdominal aortic aneurysm ([place]) 09/2008   s/o endovascular repair and angiogram 10/04/08   . Anemia    EGD Cscope 08-2012  . Arthritis    "hips" (11/23/2017)  . Atrial fibrillation ([place])   . Bleeding stomach ulcer 1990s  . Bleeding ulcer 1997  . CKD (chronic kidney disease), stage IV (H[facility])    Stable, Dr. Dimas Aguas  . COPD (chronic obstructive pulmonary disease) ([place])    "mild" (11/23/2017)  . Dementia    "on very small dose of Aricept" (11/23/2017)  . H/O hyperkalemia    Stable  . Hypertension   . Leukopenia 2/11   thought to be from doxy  . Pneumonia 2018 X 2  . Renal cell carcinoma    clear cell type dx 10/09, s/p excision 10-24-08  . Renal insufficiency   . Solitary left kidney    W/ chronic tubule-interstitial damage, Dr. Dimas Aguas  . Spinal stenosis    causing LE weakness-persistant, being worked up by Hydrographic surveyor and neurologist    Past Surgical History:  Procedure Laterality Date  . ABDOMINAL AORTIC ANEURYSM REPAIR  2009   EVAR  . CATARACT EXTRACTION W/ INTRAOCULAR LENS  IMPLANT, BILATERAL Bilateral   . HEMORRHOID SURGERY  1990  . IR ANGIOGRAM PELVIS SELECTIVE OR SUPRASELECTIVE  11/24/2017  . IR  ANGIOGRAM SELECTIVE EACH ADDITIONAL VESSEL  11/24/2017  . IR ANGIOGRAM SELECTIVE EACH ADDITIONAL VESSEL  11/24/2017  . IR ANGIOGRAM SELECTIVE EACH ADDITIONAL VESSEL  11/24/2017  . IR ANGIOGRAM SELECTIVE EACH ADDITIONAL VESSEL  11/24/2017  . IR ANGIOGRAM VISCERAL SELECTIVE  11/24/2017  . IR ANGIOGRAM VISCERAL SELECTIVE  11/24/2017  . IR RADIOLOGIST EVAL & MGMT  09/07/2017  . IR US GUIDE VASC ACCESS RIGHT  11/24/2017  . [place] ([place]), 2004  . NEPHRECTOMY  2009   Open nephrectomy for cancer    There were no vitals filed for this visit.   Subjective Assessment - 06/05/18 1445    Subjective  Pt's dtr reports pt has been declining since end of April - increased resp issues and decreased strength and endurance. Pt notes trouble with "getting in and out of bed" and walking due to weakness and poor balance. Notes heavieness in LE, stating it "feels like my sock weight 5# each."    Patient Stated Goals  "keep the good work up - walking better and getting up/down easier"    Currently in Pain?  No/denies         [facility] PT Assessment - 06/05/18 1440      Assessment   Medical  Diagnosis  Debility / Gait disorder    Referring Provider  Kathlene November, MD    Onset Date/Surgical Date  -- late April 2019    Next MD Visit  07/05/18      Precautions   Precautions  Fall      Balance Screen   Has the patient fallen in the past 6 months  No    Has the patient had a decrease in activity level because of a fear of falling?   Yes    Is the patient reluctant to leave their home because of a fear of falling?   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      Cognition   Overall Cognitive Status  Within Functional Limits for tasks assessed      Posture/Postural Control   Posture/Postural Control  Postural limitations    Postural Limitations  Forward head;Rounded Shoulders;Increased thoracic kyphosis;Decreased lumbar lordosis;Posterior pelvic tilt;Flexed trunk      ROM / Strength   AROM / PROM / Strength  Strength      Strength   Overall Strength Comments  tested in stting d/t other health concerns    Strength Assessment Site  Hip;Knee;Ankle    Right/Left Hip  Right;Left    Right Hip Flexion  3+/5    Right Hip Extension  3+/5    Right Hip ABduction  4-/5    Right Hip ADduction  4-/5    Left Hip Flexion  3+/5    Left Hip Extension  3+/5    Left Hip ABduction  4-/5    Left Hip ADduction  4-/5    Right/Left Knee  Right;Left    Right Knee Flexion  4-/5    Right Knee Extension  4-/5    Left Knee Flexion  4-/5    Left Knee Extension  4-/5    Right/Left Ankle  Right;Left    Right Ankle Dorsiflexion  4-/5    Left Ankle Dorsiflexion  4-/5      Palpation   Palpation comment  pitting edema in B LE      Ambulation/Gait   Ambulation/Gait  Yes    Ambulation/Gait Assistance  5: Supervision;4: Min guard    Ambulation Distance (Feet)  70 Feet    Assistive device  None;Straight cane;4-wheeled walker    Gait Pattern  Decreased stride length;Decreased arm swing - right;Decreased arm swing - left;Decreased hip/knee flexion - right;Decreased hip/knee flexion - left;Right foot flat;Left foot flat;Poor foot clearance - left;Poor foot clearance - right;Decreased trunk rotation;Trunk flexed    Ambulation Surface  Level;Indoor    Gait velocity  1.51 ft/sec    Gait Comments  Pt unable to appropriately sequence SPC despite cueing. Introduced  Radiation protection practitioner with pt noting increased comfort and better stability.      Standardized Balance Assessment  Standardized Balance Assessment  Berg Balance Test;Timed Up and Go Test;10 meter walk test    10 Meter Walk  21.75      Berg Balance Test   Sit to Stand  Able to stand  independently using hands    Standing Unsupported  Able to stand 2 minutes with supervision    Sitting with Back Unsupported but Feet Supported on Floor or Stool  Able to sit safely and securely 2 minutes    Stand to Sit  Controls descent by using hands    Transfers  Able to transfer with verbal cueing and /or supervision    Standing Unsupported with Eyes Closed  Able to stand 10 seconds with supervision    Standing Ubsupported with Feet Together  Able to place feet together independently and stand for 1 minute with supervision    From Standing, Reach Forward with Outstretched Arm  Can reach forward >5 cm safely (2")    From Standing Position, Pick up Object from Floor  Unable to pick up shoe, but reaches 2-5 cm (1-2") from shoe and balances independently    From Standing Position, Turn to Look Behind Over each Shoulder  Needs supervision when turning    Turn 360 Degrees  Needs close supervision or verbal cueing    Standing Unsupported, Alternately Place Feet on Step/Stool  Able to complete >2 steps/needs minimal assist    Standing Unsupported, One Foot in Front  Needs help to step but can hold 15 seconds    Standing on One Leg  Unable to try or needs assist to prevent fall    Total Score  29    Berg comment:  < 36 high risk for falls (close to 100%)      Timed Up and Go Test   Normal TUG (seconds)  20.47    TUG Comments  >13.5 sec indicates high fall risk                Objective measurements completed on examination: See above findings.                PT Short Term Goals - 06/05/18 1819      PT SHORT TERM GOAL #1   Title  Independent with initial HEP as indicated    Status  New    Target  Date  06/26/18      PT SHORT TERM GOAL #2   Title  Pt will improve gait speed to >/= 1.8 ft/sec to reduce fall risk    Status  New    Target Date  07/03/18        PT Long Term Goals - 06/05/18 1820      PT LONG TERM GOAL #1   Title  Pt will be independent with advanced HEP as indicated    Status  New    Target Date  08/04/18      PT LONG TERM GOAL #2   Title  B LE strength to >/= 4/5 for improved function     Status  New    Target Date  08/04/18      PT LONG TERM GOAL #3   Title  Pt will demonstrate improvement on the Berg Balance Scale to 44/56 or greater to reduce risk for falls     Status  New    Target Date  08/04/18      PT LONG TERM GOAL #4   Title  Pt will improve gait speed to 2.2 ft/sec or greater  for improved safety with community ambulation     Status  New    Target Date  08/04/18      PT LONG TERM GOAL #5   Title  Pt will complete TUG in </= 15 sec to improve safety with transitions    Status  New    Target Date  08/04/18             Plan - 06/05/18 1804    Clinical Impression Statement  Mr. Necaise is an 84 y/o gentleman who presents to OP for progressive weakness and loss of balance leading to gait instability and debility. Pt well known to this PT has demonstrated a significant decline since completion of last therapy episode in January of this year. LE strength currently 3+/5 to 4-/5 vs 4/5 to 4+/5 as of earlier this year. Significant decline in scores noted on all standardized balance assessments with Merrilee Jansky 29/56 as compared to 44/56 in January and TUG = 20.47 sec as compared to 15.85 sec in January, indicating very high risk for falls. Gait speed declined from 2.22 ft/sec to 1.51 ft/sec with pt demonstrating increased forward flexion and decreased stride length as well as poor foot clearance, also placing pt at risk for recurrent falls. Given higher PLOF, pt demonstrates good potential to benefit from skilled PT to address above deficits and improved gait  stability and activity tolerance.    History and Personal Factors relevant to plan of care:  Advanced age, Parkinsonism, OA of mutiple joints, lumbar stenosis, LE neuropathy, HTN, COPD, CKD    Clinical Presentation  Evolving    Clinical Presentation due to:  worsening of mobility, gait and balance with more rapid decline since late April + complex PMH    Clinical Decision Making  Moderate    Rehab Potential  Good    PT Frequency  2x / week    PT Duration  8 weeks    PT Treatment/Interventions  Patient/family education;ADLs/Self Care Home Management;Neuromuscular re-education;Therapeutic exercise;Therapeutic activities;Functional mobility training;Gait training;Balance training;Manual techniques;Passive range of motion;Taping;Electrical Stimulation;Moist Heat    Consulted and Agree with Plan of Care  Patient;Family member/caregiver    Family Member Consulted  dtr - [name]       Patient will benefit from skilled therapeutic intervention in order to improve the following deficits and impairments:  Decreased strength, Decreased balance, Decreased activity tolerance, Postural dysfunction, Decreased mobility, Difficulty walking, Abnormal gait, Decreased safety awareness, Decreased knowledge of use of DME, Pain, Decreased coordination, Decreased endurance  Visit Diagnosis: Other abnormalities of gait and mobility  Unsteadiness on feet  Difficulty in walking, not elsewhere classified  Muscle weakness (generalized)  Abnormal posture     Problem List Patient Active Problem List   Diagnosis Date Noted  . Pulmonary infiltrates 06/02/2018  . Recurrent pneumonia 12/29/2017  . Spinal stenosis   . Solitary left kidney   . H/O hyperkalemia   . Dementia   . Atrial fibrillation ([place])   . Benign prostatic hyperplasia with nocturia 12/04/2017  . Hypoalbuminemia due to protein-calorie malnutrition ([place])   . Thrombocytopenia ([place])   . Chronic kidney disease (CKD), stage IV (severe) ([place])   .  Debility 11/29/2017  . Parkinsonism ([place]) 07/10/2017  . Status post dilatation of esophageal stricture 05/15/2017  . Pancytopenia ([place]) 03/31/2016  . Follow-up --- PCP NOTES 09/02/2015  . Neuropathy 06/10/2015  . Pedal edema 01/30/2015  . Hip pain, chronic 01/30/2015  . Cramps of lower extremity 01/30/2015  . Annual physical exam 10/19/2011  . DJD (  degenerative joint disease) 12/14/2010  . Leukopenia 01/27/2010  . Anemia 06/06/2009  . COPD GOLD I  06/06/2009  . RENAL CELL CANCER 12/25/2008  . HTN (hypertension) 12/25/2008  . AAA (abdominal aortic aneurysm) without rupture ([place]) 12/25/2008  . CKD (chronic kidney disease) 12/25/2008  . HYPERCHOLESTEROLEMIA 03/01/2008  . GERD 03/01/2008  . ACNE ROSACEA 11/29/2007  . DIVERTICULOSIS, COLON 04/12/2007    Percival Spanish, PT, MPT 06/05/2018, 6:26 PM  [facility] [address]  [place], [place], [address] Phone: [phone]   Fax:  [phone]  Name: DEVONTAY CELAYA MRN: [id number] Date of Birth: 08-28-1927

## 2018-06-06 ENCOUNTER — Encounter: Payer: Self-pay | Admitting: Internal Medicine

## 2018-06-06 IMAGING — US US RENAL
1 series · 14 of 25 positions shown · non-contrast
Comparison: 11/25/1999

CLINICAL DATA: 89-year-old male with acute kidney injury. History
of right nephrectomy for renal cell carcinoma.

EXAM:
RENAL / URINARY TRACT ULTRASOUND COMPLETE

[Series 1: us renal · 0.26mm/px · 14 of 33 slices shown]
[im 1/33]
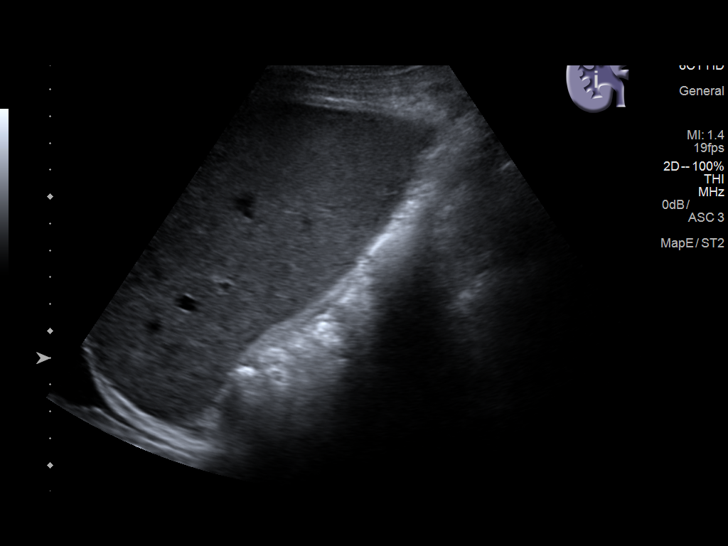
[im 3/33]
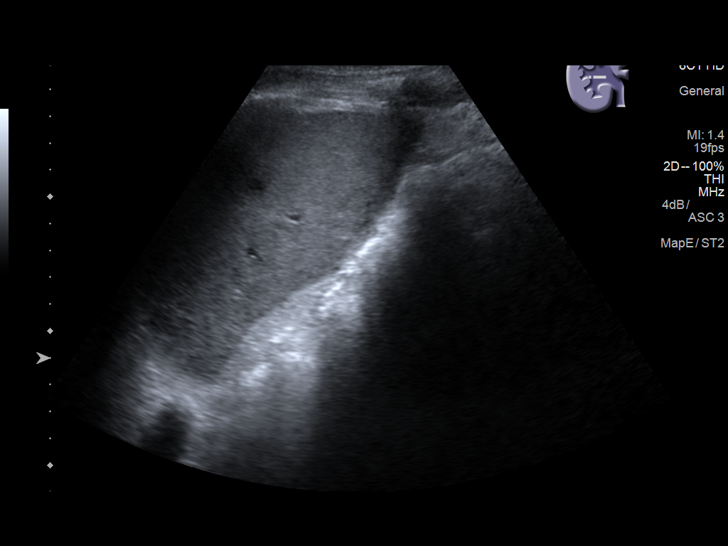
[im 6/33]
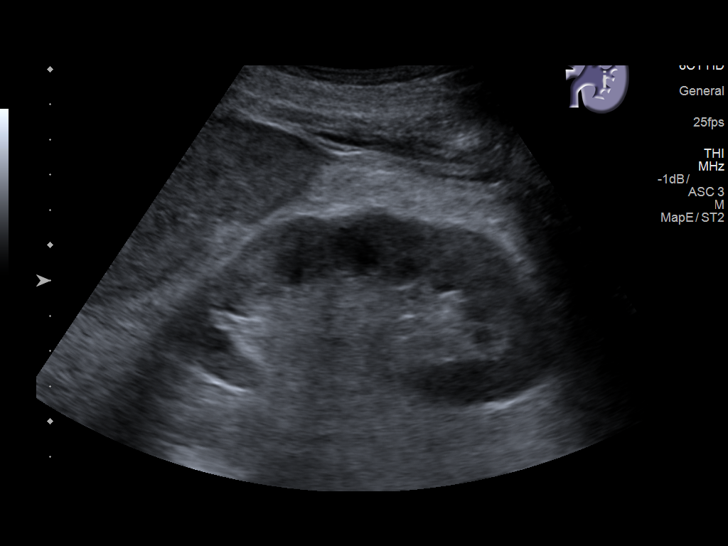
[im 9/33]
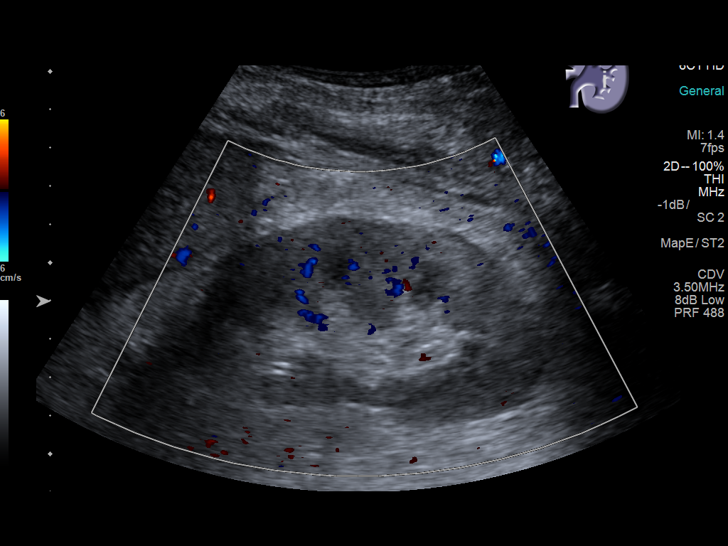
[im 11/33]
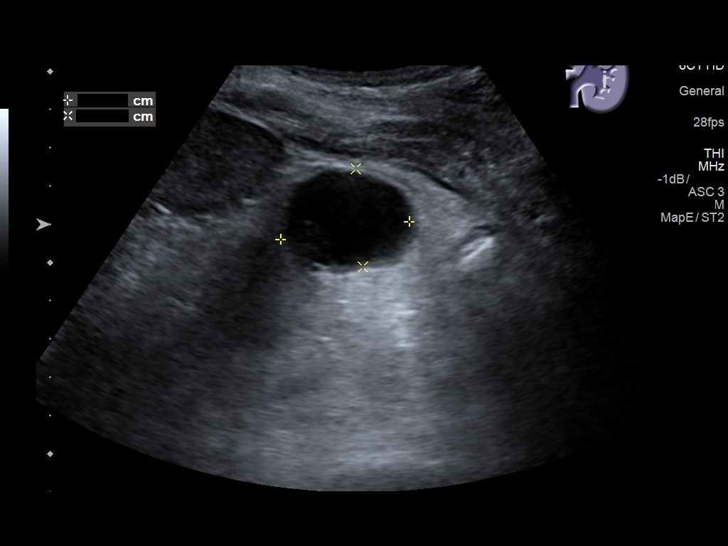
[im 13/33]
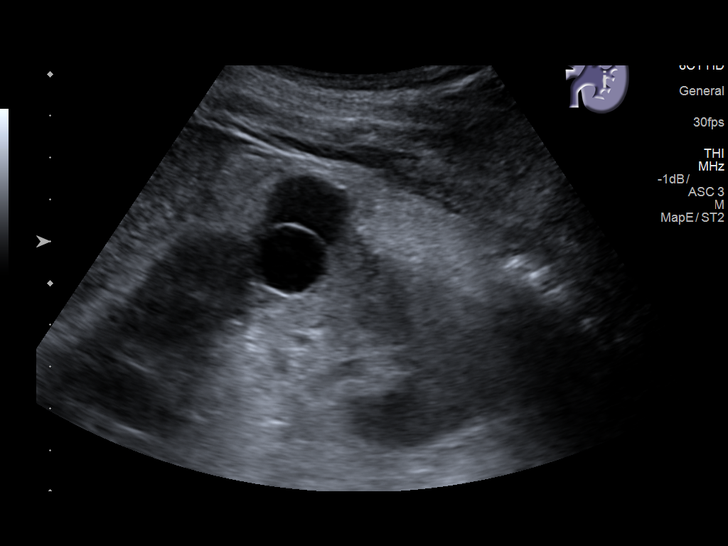
[im 15/33]
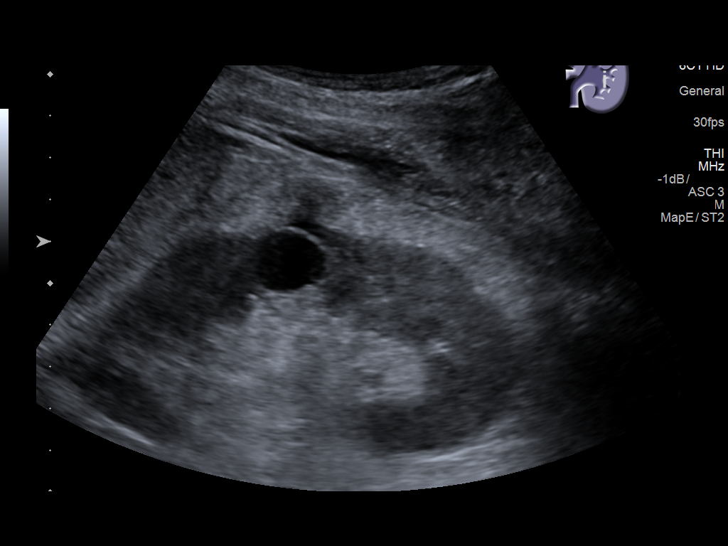
[im 18/33]
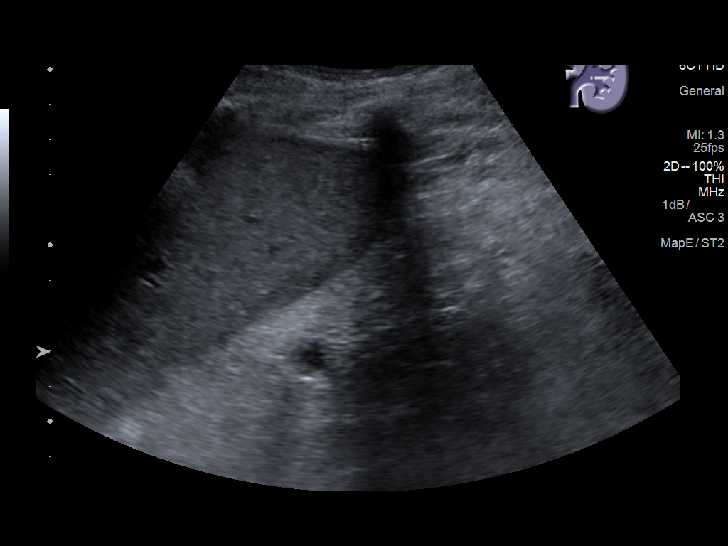
[im 21/33]
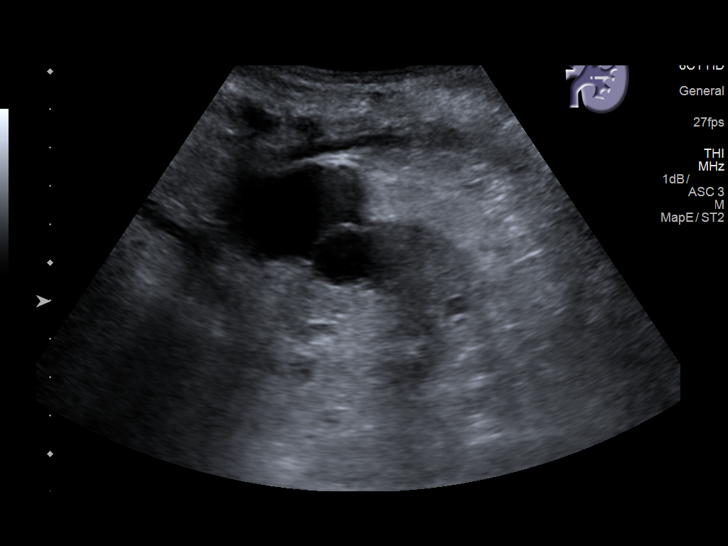
[im 22/33]
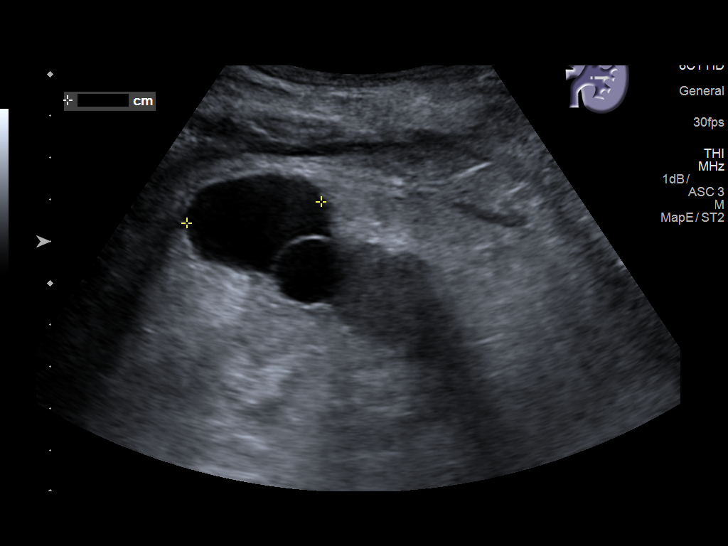
[im 25/33]
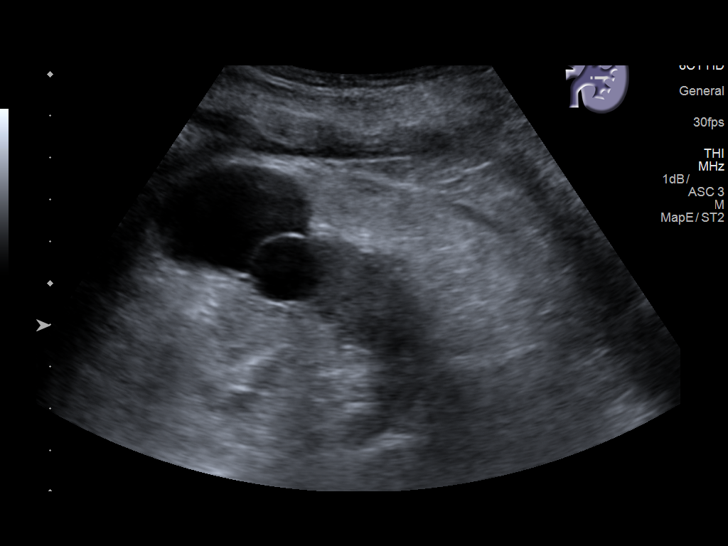
[im 27/33]
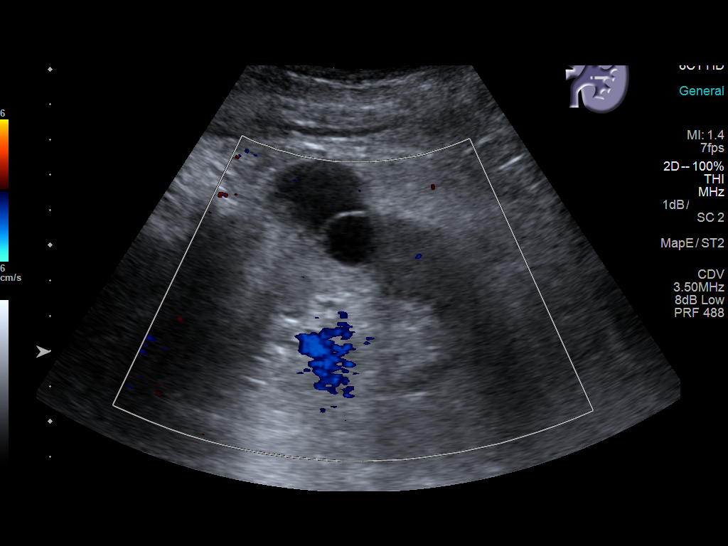
[im 30/33]
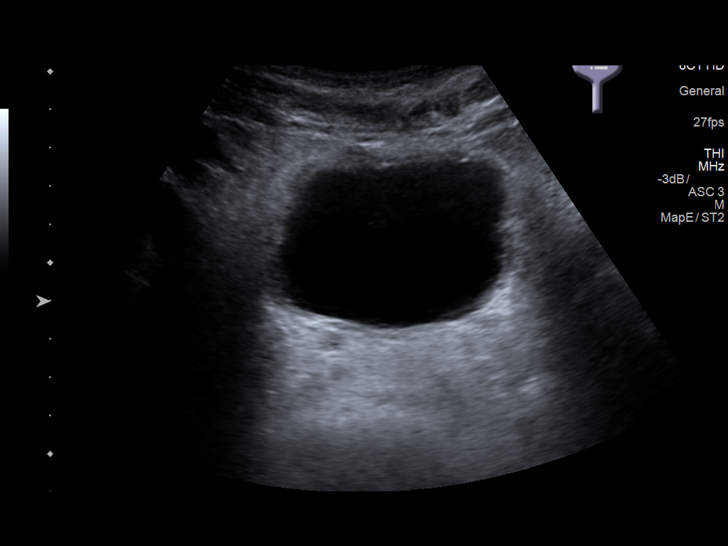
[im 33/33]
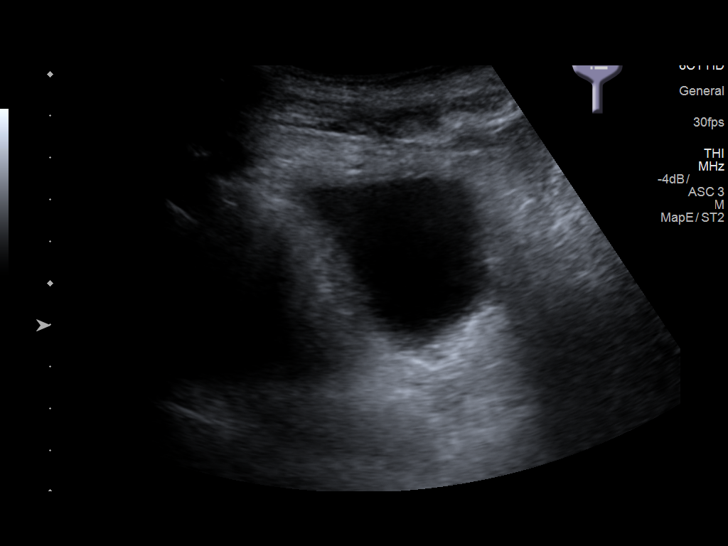

[14 of 25 positions shown; findings below may reference images not displayed]

FINDINGS: Right Kidney:

Not visualized compatible with nephrectomy.

Left Kidney:

Length: 11.1 cm. Renal echogenicity is normal. Multiple renal cysts
are identified, the largest measuring 3.4 cm in the mid-upper left
kidney. No hydronephrosis or solid mass identified.

Bladder:

Appears normal for degree of bladder distention.
IMPRESSION: 1. Unremarkable left kidney except for renal cysts.
2. Status post right nephrectomy
3. Unremarkable bladder

## 2018-06-06 NOTE — Assessment & Plan Note (Addendum)
Spirometry 05/15/2018  FEV1 2.43 (85%)  Ratio 66 with mild curvature on ? Prior  - 05/15/2018 maint on bud/formoterol   - 06/01/18 VEST ordered as not able to mobilize secretions using flutter valve   Nothing to offer pending delivery of VEST, will try to expedite and also check RA overnight to see if he qualifies for noct 02

## 2018-06-06 NOTE — Assessment & Plan Note (Addendum)
Can't exclude HCAP here clinically so rec levaquin 500 mg qd x 10 days   Discussed in detail all the  indications, usual  risks and alternatives  relative to the benefits with patient who agrees to proceed with rx as outlined.     I had an extended discussion with the patient reviewing all relevant studies completed to date and  lasting 15 to 20 minutes of a 25 minute acute office visit    Each maintenance medication was reviewed in detail including most importantly the difference between maintenance and prns and under what circumstances the prns are to be triggered using an action plan format that is not reflected in the computer generated alphabetically organized AVS.    Please see AVS for specific instructions unique to this visit that I personally wrote and verbalized to the the pt in detail and then reviewed with pt  by my nurse highlighting any  changes in therapy recommended at today's visit to their plan of care.

## 2018-06-08 ENCOUNTER — Encounter: Payer: Self-pay | Admitting: Physical Therapy

## 2018-06-08 ENCOUNTER — Ambulatory Visit: Payer: Medicare Other | Admitting: Physical Therapy

## 2018-06-08 DIAGNOSIS — M6281 Muscle weakness (generalized): Secondary | ICD-10-CM | POA: Diagnosis not present

## 2018-06-08 DIAGNOSIS — R2681 Unsteadiness on feet: Secondary | ICD-10-CM

## 2018-06-08 DIAGNOSIS — R262 Difficulty in walking, not elsewhere classified: Secondary | ICD-10-CM

## 2018-06-08 DIAGNOSIS — R293 Abnormal posture: Secondary | ICD-10-CM

## 2018-06-08 DIAGNOSIS — J449 Chronic obstructive pulmonary disease, unspecified: Secondary | ICD-10-CM | POA: Diagnosis not present

## 2018-06-08 DIAGNOSIS — R2689 Other abnormalities of gait and mobility: Secondary | ICD-10-CM | POA: Diagnosis not present

## 2018-06-08 NOTE — Therapy (Addendum)
[facility] [address]  [place], [place], [address] Phone: [phone]   Fax:  [phone]  Physical Therapy Treatment  Patient Details  Name: SHISHIR KRANTZ MRN: [id number] Date of Birth: 1927/06/12 Referring Provider: Kathlene November, MD   Encounter Date: 06/08/2018  PT End of Session - 06/08/18 0826    Visit Number  2    Number of Visits  16    Date for PT Re-Evaluation  08/04/18    Authorization Type  Medicare & BCBS    PT Start Time  0826    PT Stop Time  0915    PT Time Calculation (min)  49 min    Activity Tolerance  Patient tolerated treatment well;Patient limited by fatigue    Behavior During Therapy  [facility] for tasks assessed/performed       Past Medical History:  Diagnosis Date  . Abdominal aortic aneurysm ([place]) 09/2008   s/o endovascular repair and angiogram 10/04/08   . Anemia    EGD Cscope 08-2012  . Arthritis    "hips" (11/23/2017)  . Atrial fibrillation ([place])   . Bleeding stomach ulcer 1990s  . Bleeding ulcer 1997  . CKD (chronic kidney disease), stage IV (HCC)    Stable, Dr. Dimas Aguas  . COPD (chronic obstructive pulmonary disease) ([place])    "mild" (11/23/2017)  . Dementia    "on very small dose of Aricept" (11/23/2017)  . H/O hyperkalemia    Stable  . Hypertension   . Leukopenia 2/11   thought to be from doxy  . Pneumonia 2018 X 2  . Renal cell carcinoma    clear cell type dx 10/09, s/p excision 10-24-08  . Renal insufficiency   . Solitary left kidney    W/ chronic tubule-interstitial damage, Dr. Dimas Aguas  . Spinal stenosis    causing LE weakness-persistant, being worked up by Hydrographic surveyor and neurologist    Past Surgical History:  Procedure Laterality Date  . ABDOMINAL AORTIC ANEURYSM REPAIR  2009   EVAR  . CATARACT EXTRACTION W/ INTRAOCULAR LENS  IMPLANT, BILATERAL Bilateral   . HEMORRHOID SURGERY  1990  . IR ANGIOGRAM PELVIS SELECTIVE OR SUPRASELECTIVE  11/24/2017  . IR  ANGIOGRAM SELECTIVE EACH ADDITIONAL VESSEL  11/24/2017  . IR ANGIOGRAM SELECTIVE EACH ADDITIONAL VESSEL  11/24/2017  . IR ANGIOGRAM SELECTIVE EACH ADDITIONAL VESSEL  11/24/2017  . IR ANGIOGRAM SELECTIVE EACH ADDITIONAL VESSEL  11/24/2017  . IR ANGIOGRAM VISCERAL SELECTIVE  11/24/2017  . IR ANGIOGRAM VISCERAL SELECTIVE  11/24/2017  . IR RADIOLOGIST EVAL & MGMT  09/07/2017  . IR US GUIDE VASC ACCESS RIGHT  11/24/2017  . [place] ([place]), 2004  . NEPHRECTOMY  2009   Open nephrectomy for cancer    There were no vitals filed for this visit.  Subjective Assessment - 06/08/18 0834    Subjective  Pt's dtr brought rollator for PT to assess.    Patient Stated Goals  "keep the good work up - walking better and getting up/down easier"    Currently in Pain?  No/denies                       [facility] Adult PT Treatment/Exercise - 06/08/18 0826      Transfers   Transfers  Sit to Stand;Stand to Sit    Sit to Stand  4: Min guard;With upper extremity assist;Multiple attempts    Sit to Stand Details  Verbal  cues for technique;Visual cues/gestures for sequencing;Tactile cues for weight shifting;Verbal cues for safe use of DME/AE    Sit to Stand Details (indicate cue type and reason)  cues for fwd scoot to edge of seat followed by fwd weight/rock to initiate rise to stand with cues for full upright posture upon full stance    Stand to Sit  5: Supervision;4: Min guard;With upper extremity assist;Uncontrolled descent    Stand to Sit Details (indicate cue type and reason)  Verbal cues for safe use of DME/AE;Visual cues for safe use of DME/AE      Ambulation/Gait   Ambulation/Gait Assistance  5: Supervision    Ambulation/Gait Assistance Details  Cues for rollator proximity as well as increased hip and knee flexion and increased step length with heel strike between rear wheels of rollator    Ambulation Distance (Feet)  90 Feet x3    Assistive device  4-wheeled walker    Gait Pattern   Step-through pattern;Decreased stride length;Decreased hip/knee flexion - right;Decreased hip/knee flexion - left;Poor foot clearance - left;Poor foot clearance - right;Trunk flexed    Ambulation Surface  Level;Indoor      Knee/Hip Exercises: Aerobic   Nustep  L3 x 6' - B UE/LE to promote reciprocal movement patterns      Knee/Hip Exercises: Seated   Long Arc Quad  Right;Left;10 reps    Clamshell with TheraBand  Yellow alt hip ABD/ER x10    Other Seated Knee/Hip Exercises  Heel/toe raises x15    Marching  Both;10 reps;Strengthening    Marching Limitations  looped yellow TB at knees    Hamstring Curl  Right;Left;10 reps;Strengthening    Hamstring Limitations  yellow TB      Shoulder Exercises: Seated   Row  Left;10 reps;Theraband;Strengthening    Theraband Level (Shoulder Row)  Level 1 (Yellow)    Row Limitations  cues for upright posture and scap retraction    Horizontal ABduction  Both;10 reps;Theraband;Strengthening    Theraband Level (Shoulder Horizontal ABduction)  Level 1 (Yellow)    Horizontal ABduction Limitations  pool noodle behind pt in chair to promote upright posture & provide tactile cues for scap retraction    Flexion  Both;10 reps;Strengthening    Flexion Limitations  cues for trunk extension with UE elevation; pool noodle behind pt in chair to promote upright posture & provide tactile cues for scap retraction    Diagonals  Both;10 reps;Theraband;Strengthening    Theraband Level (Shoulder Diagonals)  Level 1 (Yellow)    Diagonals Limitations  pool noodle behind pt in chair to promote upright posture & provide tactile cues for scap retraction        [facility]) - 06/08/18 0826    PWR! exercises  Moves in sitting;Functional moves    PWR! Sit to Stand  x5 - min A    PWR! Up  x10            PT Short Term Goals - 06/08/18 0836      PT SHORT TERM GOAL #1   Title  Independent with initial HEP as indicated    Status  On-going      PT SHORT TERM GOAL #2   Title   Pt will improve gait speed to >/= 1.8 ft/sec to reduce fall risk    Status  On-going        PT Long Term Goals - 06/08/18 0836      PT LONG TERM GOAL #1   Title  Pt will be independent  with advanced HEP as indicated    Status  On-going      PT LONG TERM GOAL #2   Title  B LE strength to >/= 4/5 for improved function     Status  On-going      PT LONG TERM GOAL #3   Title  Pt will demonstrate improvement on the Berg Balance Scale to 44/56 or greater to reduce risk for falls     Status  On-going      PT LONG TERM GOAL #4   Title  Pt will improve gait speed to 2.2 ft/sec or greater for improved safety with community ambulation     Status  On-going      PT LONG TERM GOAL #5   Title  Pt will complete TUG in </= 15 sec to improve safety with transitions    Status  On-going            Plan - 06/08/18 0915    Clinical Impression Statement  Pt's dtr, [name], brought rollator she purchased for pt at an [employer] for assessment and adjustment as indicated by PT - rollator in good condition with properly functioning brakes but height set slightly taller than normal, altough currently not corrected in hopes of promoting more upright posture with ambulation. Provided training to pt in safe use of rollator during transfers and ambulation, targeting proper use of brakes and positioning during transfers and posture, proximity and alignment during gait as well as cues for good step pattern - Mr. Gendron able to perform return demonstration after cueing but repeated cueing needed with subsequent attempts, indicating need for continued reinforcement. Initiated UE/LE therapeutic exercises in sitting today, empahaisizing upright posture and core/trunk extensor activation using pool noodle along spine in chair to provide tactile input. Pt fatiguing quickly with most exercises, requiring frequent rest breaks between exercises and alternation of UE and LE exercises to minimize fatigue.    Rehab Potential   Good    PT Treatment/Interventions  Patient/family education;ADLs/Self Care Home Management;Neuromuscular re-education;Therapeutic exercise;Therapeutic activities;Functional mobility training;Gait training;Balance training;Manual techniques;Passive range of motion;Taping;Electrical Stimulation;Moist Heat    Consulted and Agree with Plan of Care  Patient;Family member/caregiver    Family Member Consulted  dtr - [name]       Patient will benefit from skilled therapeutic intervention in order to improve the following deficits and impairments:  Decreased strength, Decreased balance, Decreased activity tolerance, Postural dysfunction, Decreased mobility, Difficulty walking, Abnormal gait, Decreased safety awareness, Decreased knowledge of use of DME, Pain, Decreased coordination, Decreased endurance  Visit Diagnosis: Other abnormalities of gait and mobility  Unsteadiness on feet  Difficulty in walking, not elsewhere classified  Muscle weakness (generalized)  Abnormal posture     Problem List Patient Active Problem List   Diagnosis Date Noted  . Pulmonary infiltrates 06/02/2018  . Recurrent pneumonia 12/29/2017  . Spinal stenosis   . Solitary left kidney   . H/O hyperkalemia   . Dementia   . Atrial fibrillation ([place])   . Benign prostatic hyperplasia with nocturia 12/04/2017  . Hypoalbuminemia due to protein-calorie malnutrition ([place])   . Thrombocytopenia ([place])   . Chronic kidney disease (CKD), stage IV (severe) ([place])   . Debility 11/29/2017  . Parkinsonism ([place]) 07/10/2017  . Status post dilatation of esophageal stricture 05/15/2017  . Pancytopenia ([place]) 03/31/2016  . Follow-up --- PCP NOTES 09/02/2015  . Neuropathy 06/10/2015  . Pedal edema 01/30/2015  . Hip pain, chronic 01/30/2015  . Cramps of lower extremity 01/30/2015  .  Annual physical exam 10/19/2011  . DJD (degenerative joint disease) 12/14/2010  . Leukopenia 01/27/2010  . Anemia 06/06/2009  . COPD GOLD I   06/06/2009  . RENAL CELL CANCER 12/25/2008  . HTN (hypertension) 12/25/2008  . AAA (abdominal aortic aneurysm) without rupture ([place]) 12/25/2008  . CKD (chronic kidney disease) 12/25/2008  . HYPERCHOLESTEROLEMIA 03/01/2008  . GERD 03/01/2008  . ACNE ROSACEA 11/29/2007  . DIVERTICULOSIS, COLON 04/12/2007    Percival Spanish, PT, MPT 06/08/2018, 10:55 AM  [facility] [address]  [place], [place], [address] Phone: [phone]   Fax:  [phone]  Name: ZAKARIE STURDIVANT MRN: [id number] Date of Birth: 12-06-1927  PHYSICAL THERAPY DISCHARGE SUMMARY  Visits from Start of Care: 2  Current functional level related to goals / functional outcomes:   Pt being discharged from PT due to suffering a fall on 06/13/18 resulting in a hip fracture. Pt to undergo surgery for fracture repair today and will likely require inpatient rehab vs SNF rehab before able to to return to OP rehab.   Remaining deficits:   As above.   Education / Equipment:   Initial training in use of rollator  Plan: Patient agrees to discharge.  Patient goals were not met. Patient is being discharged due to a change in medical status.  ?????    Percival Spanish, PT, MPT 06/15/18, 9:21 AM  [facility] [address]  [place], [place], [address] Phone: [phone]   Fax:  [phone]

## 2018-06-09 ENCOUNTER — Telehealth: Payer: Self-pay | Admitting: Internal Medicine

## 2018-06-09 NOTE — Telephone Encounter (Signed)
Attempted to call Caryl Pina with [employer]. I did not receive an answer. I have left a message for Caryl Pina to return our call.

## 2018-06-12 ENCOUNTER — Telehealth: Payer: Self-pay | Admitting: Internal Medicine

## 2018-06-12 ENCOUNTER — Encounter: Payer: Self-pay | Admitting: Internal Medicine

## 2018-06-12 DIAGNOSIS — J449 Chronic obstructive pulmonary disease, unspecified: Secondary | ICD-10-CM

## 2018-06-12 DIAGNOSIS — J9611 Chronic respiratory failure with hypoxia: Secondary | ICD-10-CM | POA: Insufficient documentation

## 2018-06-12 NOTE — Telephone Encounter (Signed)
MW has reviewed the ONO on RA done by [employer] on 06/06/18  Per MW- needs to being using o2 2lpm with sleep and then repeat the ONO on 2lpm   LMTCB

## 2018-06-12 NOTE — Telephone Encounter (Signed)
Attempted to call Caryl Pina with [employer]. I did not receive an answer. I have left a message for Caryl Pina to return our call.

## 2018-06-12 NOTE — Telephone Encounter (Deleted)
Dr Melvyn Novas has reviewed the ONO on RA from 06/06/18 done by [employer]  Per MW- pt needs to use 2lpm o2 with sleep and have the ONO repeated on the 2lpm

## 2018-06-12 NOTE — Telephone Encounter (Signed)
Daughter [name]) returned phone call; contact # [phone]; ok to leave a detail message because she at work

## 2018-06-12 NOTE — Telephone Encounter (Signed)
I have left a detailed message on the pt's daughter's [name]) voicemail per her request. Orders have been placed for oxygen therapy and repeat ONO. Nothing further was needed.

## 2018-06-13 DIAGNOSIS — J449 Chronic obstructive pulmonary disease, unspecified: Secondary | ICD-10-CM | POA: Diagnosis present

## 2018-06-13 DIAGNOSIS — N400 Enlarged prostate without lower urinary tract symptoms: Secondary | ICD-10-CM | POA: Diagnosis present

## 2018-06-13 DIAGNOSIS — I10 Essential (primary) hypertension: Secondary | ICD-10-CM | POA: Diagnosis not present

## 2018-06-13 DIAGNOSIS — S72142D Displaced intertrochanteric fracture of left femur, subsequent encounter for closed fracture with routine healing: Secondary | ICD-10-CM | POA: Diagnosis not present

## 2018-06-13 DIAGNOSIS — S299XXA Unspecified injury of thorax, initial encounter: Secondary | ICD-10-CM | POA: Diagnosis not present

## 2018-06-13 DIAGNOSIS — Z9889 Other specified postprocedural states: Secondary | ICD-10-CM | POA: Diagnosis not present

## 2018-06-13 DIAGNOSIS — E870 Hyperosmolality and hypernatremia: Secondary | ICD-10-CM | POA: Diagnosis not present

## 2018-06-13 DIAGNOSIS — N179 Acute kidney failure, unspecified: Secondary | ICD-10-CM | POA: Diagnosis not present

## 2018-06-13 DIAGNOSIS — F068 Other specified mental disorders due to known physiological condition: Secondary | ICD-10-CM | POA: Diagnosis not present

## 2018-06-13 DIAGNOSIS — I491 Atrial premature depolarization: Secondary | ICD-10-CM | POA: Diagnosis not present

## 2018-06-13 DIAGNOSIS — E8809 Other disorders of plasma-protein metabolism, not elsewhere classified: Secondary | ICD-10-CM | POA: Diagnosis not present

## 2018-06-13 DIAGNOSIS — Z66 Do not resuscitate: Secondary | ICD-10-CM | POA: Diagnosis present

## 2018-06-13 DIAGNOSIS — I4510 Unspecified right bundle-branch block: Secondary | ICD-10-CM | POA: Diagnosis not present

## 2018-06-13 DIAGNOSIS — F0390 Unspecified dementia without behavioral disturbance: Secondary | ICD-10-CM | POA: Diagnosis not present

## 2018-06-13 DIAGNOSIS — N184 Chronic kidney disease, stage 4 (severe): Secondary | ICD-10-CM | POA: Diagnosis present

## 2018-06-13 DIAGNOSIS — S72002A Fracture of unspecified part of neck of left femur, initial encounter for closed fracture: Secondary | ICD-10-CM | POA: Diagnosis not present

## 2018-06-13 DIAGNOSIS — Z515 Encounter for palliative care: Secondary | ICD-10-CM | POA: Diagnosis present

## 2018-06-13 DIAGNOSIS — Z85528 Personal history of other malignant neoplasm of kidney: Secondary | ICD-10-CM | POA: Diagnosis not present

## 2018-06-13 DIAGNOSIS — I313 Pericardial effusion (noninflammatory): Secondary | ICD-10-CM | POA: Diagnosis not present

## 2018-06-13 DIAGNOSIS — R609 Edema, unspecified: Secondary | ICD-10-CM | POA: Diagnosis not present

## 2018-06-13 DIAGNOSIS — G20 Parkinson's disease: Secondary | ICD-10-CM | POA: Diagnosis not present

## 2018-06-13 DIAGNOSIS — R5381 Other malaise: Secondary | ICD-10-CM | POA: Diagnosis present

## 2018-06-13 DIAGNOSIS — Z4789 Encounter for other orthopedic aftercare: Secondary | ICD-10-CM | POA: Diagnosis not present

## 2018-06-13 DIAGNOSIS — I444 Left anterior fascicular block: Secondary | ICD-10-CM | POA: Diagnosis not present

## 2018-06-13 DIAGNOSIS — D469 Myelodysplastic syndrome, unspecified: Secondary | ICD-10-CM | POA: Diagnosis present

## 2018-06-13 DIAGNOSIS — W1839XA Other fall on same level, initial encounter: Secondary | ICD-10-CM | POA: Diagnosis not present

## 2018-06-13 DIAGNOSIS — R7989 Other specified abnormal findings of blood chemistry: Secondary | ICD-10-CM | POA: Diagnosis not present

## 2018-06-13 DIAGNOSIS — I482 Chronic atrial fibrillation: Secondary | ICD-10-CM | POA: Diagnosis present

## 2018-06-13 DIAGNOSIS — D696 Thrombocytopenia, unspecified: Secondary | ICD-10-CM | POA: Diagnosis not present

## 2018-06-13 DIAGNOSIS — R1310 Dysphagia, unspecified: Secondary | ICD-10-CM | POA: Diagnosis present

## 2018-06-13 DIAGNOSIS — F0280 Dementia in other diseases classified elsewhere without behavioral disturbance: Secondary | ICD-10-CM | POA: Diagnosis present

## 2018-06-13 DIAGNOSIS — D638 Anemia in other chronic diseases classified elsewhere: Secondary | ICD-10-CM | POA: Diagnosis not present

## 2018-06-13 DIAGNOSIS — S72142A Displaced intertrochanteric fracture of left femur, initial encounter for closed fracture: Secondary | ICD-10-CM | POA: Diagnosis not present

## 2018-06-13 DIAGNOSIS — D61818 Other pancytopenia: Secondary | ICD-10-CM | POA: Diagnosis present

## 2018-06-13 DIAGNOSIS — D72819 Decreased white blood cell count, unspecified: Secondary | ICD-10-CM | POA: Diagnosis not present

## 2018-06-13 DIAGNOSIS — I440 Atrioventricular block, first degree: Secondary | ICD-10-CM | POA: Diagnosis not present

## 2018-06-13 DIAGNOSIS — N183 Chronic kidney disease, stage 3 (moderate): Secondary | ICD-10-CM | POA: Diagnosis not present

## 2018-06-13 DIAGNOSIS — D62 Acute posthemorrhagic anemia: Secondary | ICD-10-CM | POA: Diagnosis not present

## 2018-06-13 DIAGNOSIS — D631 Anemia in chronic kidney disease: Secondary | ICD-10-CM | POA: Diagnosis present

## 2018-06-13 DIAGNOSIS — I129 Hypertensive chronic kidney disease with stage 1 through stage 4 chronic kidney disease, or unspecified chronic kidney disease: Secondary | ICD-10-CM | POA: Diagnosis present

## 2018-06-13 DIAGNOSIS — Z905 Acquired absence of kidney: Secondary | ICD-10-CM | POA: Diagnosis not present

## 2018-06-13 DIAGNOSIS — I959 Hypotension, unspecified: Secondary | ICD-10-CM | POA: Diagnosis not present

## 2018-06-13 DIAGNOSIS — G3183 Dementia with Lewy bodies: Secondary | ICD-10-CM | POA: Diagnosis present

## 2018-06-13 DIAGNOSIS — M80052A Age-related osteoporosis with current pathological fracture, left femur, initial encounter for fracture: Secondary | ICD-10-CM | POA: Diagnosis present

## 2018-06-13 DIAGNOSIS — N5089 Other specified disorders of the male genital organs: Secondary | ICD-10-CM | POA: Diagnosis not present

## 2018-06-13 DIAGNOSIS — Q600 Renal agenesis, unilateral: Secondary | ICD-10-CM | POA: Diagnosis not present

## 2018-06-13 DIAGNOSIS — M25552 Pain in left hip: Secondary | ICD-10-CM | POA: Diagnosis not present

## 2018-06-13 DIAGNOSIS — I4891 Unspecified atrial fibrillation: Secondary | ICD-10-CM | POA: Diagnosis not present

## 2018-06-13 DIAGNOSIS — R05 Cough: Secondary | ICD-10-CM | POA: Diagnosis not present

## 2018-06-13 DIAGNOSIS — I452 Bifascicular block: Secondary | ICD-10-CM | POA: Diagnosis not present

## 2018-06-13 DIAGNOSIS — D649 Anemia, unspecified: Secondary | ICD-10-CM | POA: Diagnosis not present

## 2018-06-13 DIAGNOSIS — R269 Unspecified abnormalities of gait and mobility: Secondary | ICD-10-CM | POA: Diagnosis not present

## 2018-06-13 DIAGNOSIS — G629 Polyneuropathy, unspecified: Secondary | ICD-10-CM | POA: Diagnosis present

## 2018-06-13 DIAGNOSIS — J9600 Acute respiratory failure, unspecified whether with hypoxia or hypercapnia: Secondary | ICD-10-CM | POA: Diagnosis not present

## 2018-06-13 DIAGNOSIS — Y9301 Activity, walking, marching and hiking: Secondary | ICD-10-CM | POA: Diagnosis not present

## 2018-06-13 DIAGNOSIS — I371 Nonrheumatic pulmonary valve insufficiency: Secondary | ICD-10-CM | POA: Diagnosis not present

## 2018-06-13 DIAGNOSIS — Y998 Other external cause status: Secondary | ICD-10-CM | POA: Diagnosis not present

## 2018-06-13 DIAGNOSIS — I082 Rheumatic disorders of both aortic and tricuspid valves: Secondary | ICD-10-CM | POA: Diagnosis not present

## 2018-06-13 DIAGNOSIS — E119 Type 2 diabetes mellitus without complications: Secondary | ICD-10-CM | POA: Diagnosis not present

## 2018-06-13 DIAGNOSIS — J90 Pleural effusion, not elsewhere classified: Secondary | ICD-10-CM | POA: Diagnosis not present

## 2018-06-13 DIAGNOSIS — R4182 Altered mental status, unspecified: Secondary | ICD-10-CM | POA: Diagnosis not present

## 2018-06-13 DIAGNOSIS — T148XXA Other injury of unspecified body region, initial encounter: Secondary | ICD-10-CM | POA: Diagnosis not present

## 2018-06-13 DIAGNOSIS — W19XXXA Unspecified fall, initial encounter: Secondary | ICD-10-CM | POA: Diagnosis not present

## 2018-06-13 NOTE — Telephone Encounter (Signed)
Order was placed for pt to begin oxygen therapy at night and then for pt to have a repeat ONO on the oxygen so we can make sure it is going to be enough O2 for pt at night.  Nothing further needed.

## 2018-06-14 ENCOUNTER — Ambulatory Visit: Payer: Medicare Other | Admitting: Physical Therapy

## 2018-06-15 ENCOUNTER — Telehealth: Payer: Self-pay | Admitting: Internal Medicine

## 2018-06-15 NOTE — Telephone Encounter (Signed)
Spoke with Adonis Huguenin in the lobby. She states that she needs the diagnosis code for the pt's vest to be changed. Pt's OV note will need to have an addendum made to it to include the new diagnosis code. The diagnosis code that will need to be used is G71.8 >> primary disorder of the muscles. Adonis Huguenin states that since the pt has a diagnosis of Parkinson's Disease this should get the vest approved through insurance.  Dr. Melvyn Novas - please advise. Thanks.

## 2018-06-16 NOTE — Assessment & Plan Note (Signed)
Contributing to difficulty with cough mechanics/ failing flutter valve/ no one available give him vigorous CPPT > vest ordered

## 2018-06-16 NOTE — Telephone Encounter (Signed)
Noted.  Will close encounter.  

## 2018-06-16 NOTE — Telephone Encounter (Signed)
done

## 2018-06-20 ENCOUNTER — Encounter: Payer: Medicare Other | Admitting: Physical Therapy

## 2018-06-21 ENCOUNTER — Telehealth: Payer: Self-pay | Admitting: Internal Medicine

## 2018-06-21 MED ORDER — GUAIFENESIN ER 600 MG PO TB12
1200.00 | ORAL_TABLET | ORAL | Status: DC
Start: 2018-06-22 — End: 2018-06-21

## 2018-06-21 MED ORDER — ALBUTEROL SULFATE (2.5 MG/3ML) 0.083% IN NEBU
2.50 | INHALATION_SOLUTION | RESPIRATORY_TRACT | Status: DC
Start: ? — End: 2018-06-21

## 2018-06-21 MED ORDER — DOCUSATE SODIUM 100 MG PO CAPS
100.00 | ORAL_CAPSULE | ORAL | Status: DC
Start: 2018-06-23 — End: 2018-06-21

## 2018-06-21 MED ORDER — PANTOPRAZOLE 40 MG/20 ML SUSPENSION
40.00 | PACK | ORAL | Status: DC
Start: 2018-06-23 — End: 2018-06-21

## 2018-06-21 MED ORDER — IPRATROPIUM BROMIDE 0.02 % IN SOLN
0.50 | RESPIRATORY_TRACT | Status: DC
Start: ? — End: 2018-06-21

## 2018-06-21 MED ORDER — TAMSULOSIN HCL 0.4 MG PO CAPS
0.40 | ORAL_CAPSULE | ORAL | Status: DC
Start: 2018-06-22 — End: 2018-06-21

## 2018-06-21 MED ORDER — DONEPEZIL HCL 5 MG PO TABS
5.00 | ORAL_TABLET | ORAL | Status: DC
Start: 2018-06-22 — End: 2018-06-21

## 2018-06-21 MED ORDER — ACETAMINOPHEN 325 MG PO TABS
650.00 | ORAL_TABLET | ORAL | Status: DC
Start: 2018-06-22 — End: 2018-06-21

## 2018-06-21 MED ORDER — BUDESONIDE 0.25 MG/2ML IN SUSP
.25 | RESPIRATORY_TRACT | Status: DC
Start: 2018-06-22 — End: 2018-06-21

## 2018-06-21 MED ORDER — TRAMADOL HCL 50 MG PO TABS
50.00 | ORAL_TABLET | ORAL | Status: DC
Start: ? — End: 2018-06-21

## 2018-06-21 MED ORDER — ASPIRIN EC 81 MG PO TBEC
81.00 | DELAYED_RELEASE_TABLET | ORAL | Status: DC
Start: 2018-06-23 — End: 2018-06-21

## 2018-06-21 MED ORDER — CARBIDOPA-LEVODOPA 25-100 MG PO TABS
1.00 | ORAL_TABLET | ORAL | Status: DC
Start: 2018-06-22 — End: 2018-06-21

## 2018-06-21 MED ORDER — ISOSORBIDE MONONITRATE ER 60 MG PO TB24
60.00 | ORAL_TABLET | ORAL | Status: DC
Start: 2018-06-23 — End: 2018-06-21

## 2018-06-21 NOTE — Telephone Encounter (Signed)
Spoke w/ Lesleigh Noe- informed PCP will let hospice MD take over- however he will be listed as second MD for [place] to receive updates.

## 2018-06-21 NOTE — Telephone Encounter (Signed)
Please advise 

## 2018-06-21 NOTE — Telephone Encounter (Signed)
Recommend hospice doctors to take over but please keep me informed.

## 2018-06-21 NOTE — Telephone Encounter (Signed)
Copied from [place] [phone]. Topic: Quick Communication - See Telephone Encounter >> Jun 21, 2018  3:04 PM Ivar Drape wrote: CRM for notification. See Telephone encounter for: 06/21/18. Lesleigh Noe w/[facility] [place] in [place] [phone] stated that the patient is not doing well.  He fell and broke his hip.  He is not bouncing back.  The daughter has decided to bring him home and have Hospice at home with him.  Hospice would like to know if [name] wanted to be the patient's attending doctor for the hospice care or if he would prefer for the hospice doctor to take over care.

## 2018-06-22 ENCOUNTER — Encounter: Payer: Medicare Other | Admitting: Physical Therapy

## 2018-06-22 MED ORDER — HYDROMORPHONE HCL 1 MG/ML IJ SOLN
0.50 | INTRAMUSCULAR | Status: DC
Start: ? — End: 2018-06-22

## 2018-06-22 MED ORDER — GLYCOPYRROLATE 0.2 MG/ML IJ SOLN
.10 | INTRAMUSCULAR | Status: DC
Start: ? — End: 2018-06-22

## 2018-06-22 MED ORDER — LORAZEPAM 2 MG/ML IJ SOLN
0.26 | INTRAMUSCULAR | Status: DC
Start: ? — End: 2018-06-22

## 2018-06-26 ENCOUNTER — Encounter: Payer: Medicare Other | Admitting: Physical Therapy

## 2018-06-26 DEATH — deceased

## 2018-07-03 ENCOUNTER — Telehealth: Payer: Self-pay | Admitting: *Deleted

## 2018-07-03 NOTE — Telephone Encounter (Signed)
LMOM w/ my condolences, encourage to call if I can help

## 2018-07-03 NOTE — Telephone Encounter (Signed)
Copied from [place] [phone]. Topic: General - Deceased Patient >> 14-Jul-2018  5:42 PM Rutherford Nail, NT wrote: Reason for CRM: Patient's daughter, [name], calling and states that the patient passed away in the hospital last week.  Route to department's PEC Pool.

## 2018-07-04 ENCOUNTER — Encounter: Payer: Medicare Other | Admitting: Physical Therapy

## 2018-07-05 ENCOUNTER — Ambulatory Visit: Payer: Medicare Other | Admitting: Internal Medicine

## 2018-07-06 ENCOUNTER — Encounter: Payer: Medicare Other | Admitting: Physical Therapy

## 2018-07-10 ENCOUNTER — Encounter: Payer: Medicare Other | Admitting: Physical Therapy

## 2018-07-12 ENCOUNTER — Encounter: Payer: Medicare Other | Admitting: Physical Therapy

## 2018-07-17 ENCOUNTER — Encounter: Payer: Medicare Other | Admitting: Physical Therapy

## 2018-07-20 ENCOUNTER — Encounter: Payer: Medicare Other | Admitting: Physical Therapy

## 2018-07-24 ENCOUNTER — Encounter: Payer: Medicare Other | Admitting: Physical Therapy

## 2018-07-27 ENCOUNTER — Encounter: Payer: Medicare Other | Admitting: Physical Therapy

## 2018-09-18 ENCOUNTER — Ambulatory Visit: Payer: Medicare Other | Admitting: Surgery

## 2018-09-18 ENCOUNTER — Other Ambulatory Visit (HOSPITAL_COMMUNITY): Payer: Medicare Other

## 2018-09-25 IMAGING — DX DG CHEST 1V PORT
1 series · 1 of 1 positions shown · non-contrast
Comparison: 11/26/2017

CLINICAL DATA: Respiratory failure

EXAM:
PORTABLE CHEST 1 VIEW

[chest ap]
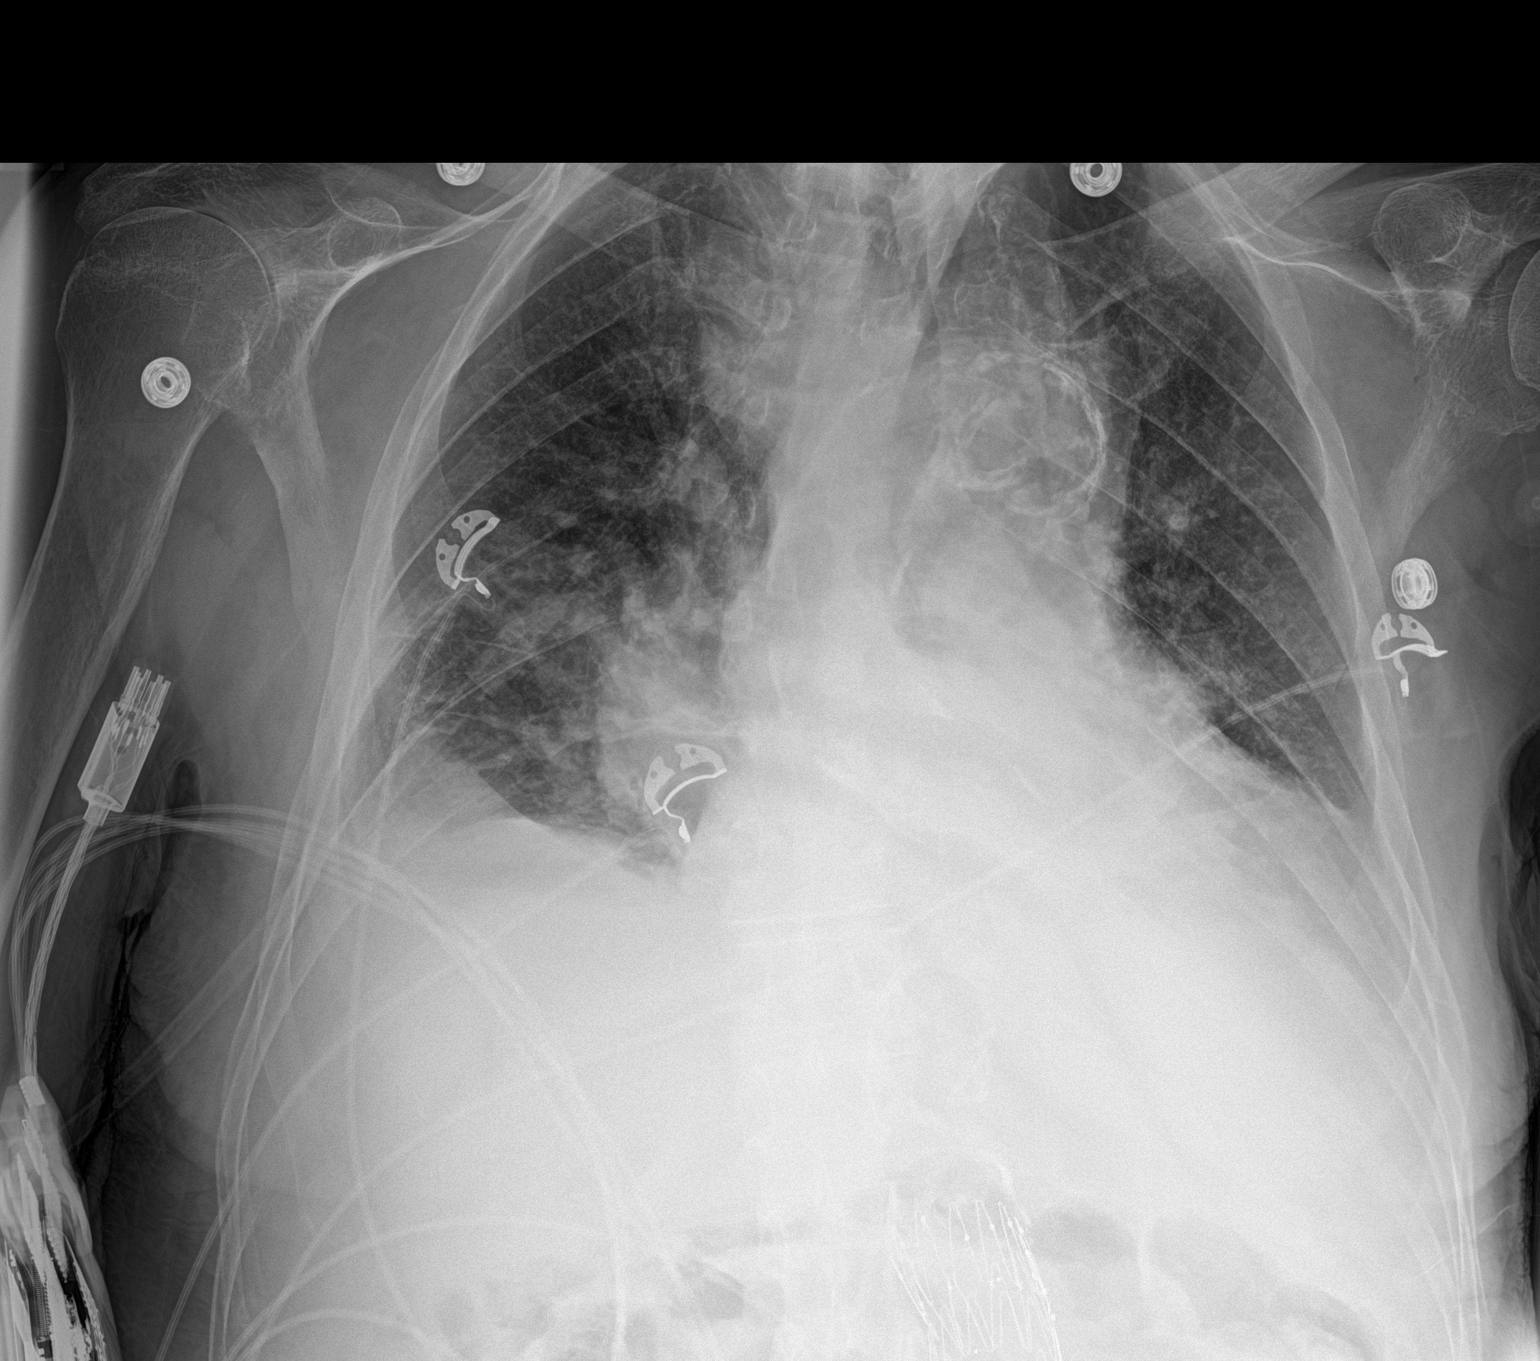

[1 of 1 positions shown; findings below may reference images not displayed]

FINDINGS: Cardiomegaly with vascular congestion, small effusions and bibasilar
atelectasis. Probable mild perihilar/interstitial edema. No real
change since prior study.
IMPRESSION: No interval change.

## 2018-09-28 IMAGING — DX DG CHEST 2V
2 series · 2 of 2 positions shown · non-contrast
Comparison: November 27, 2017 and November 26, 2017

CLINICAL DATA: Cough

EXAM:
CHEST  2 VIEW

[chest ap]
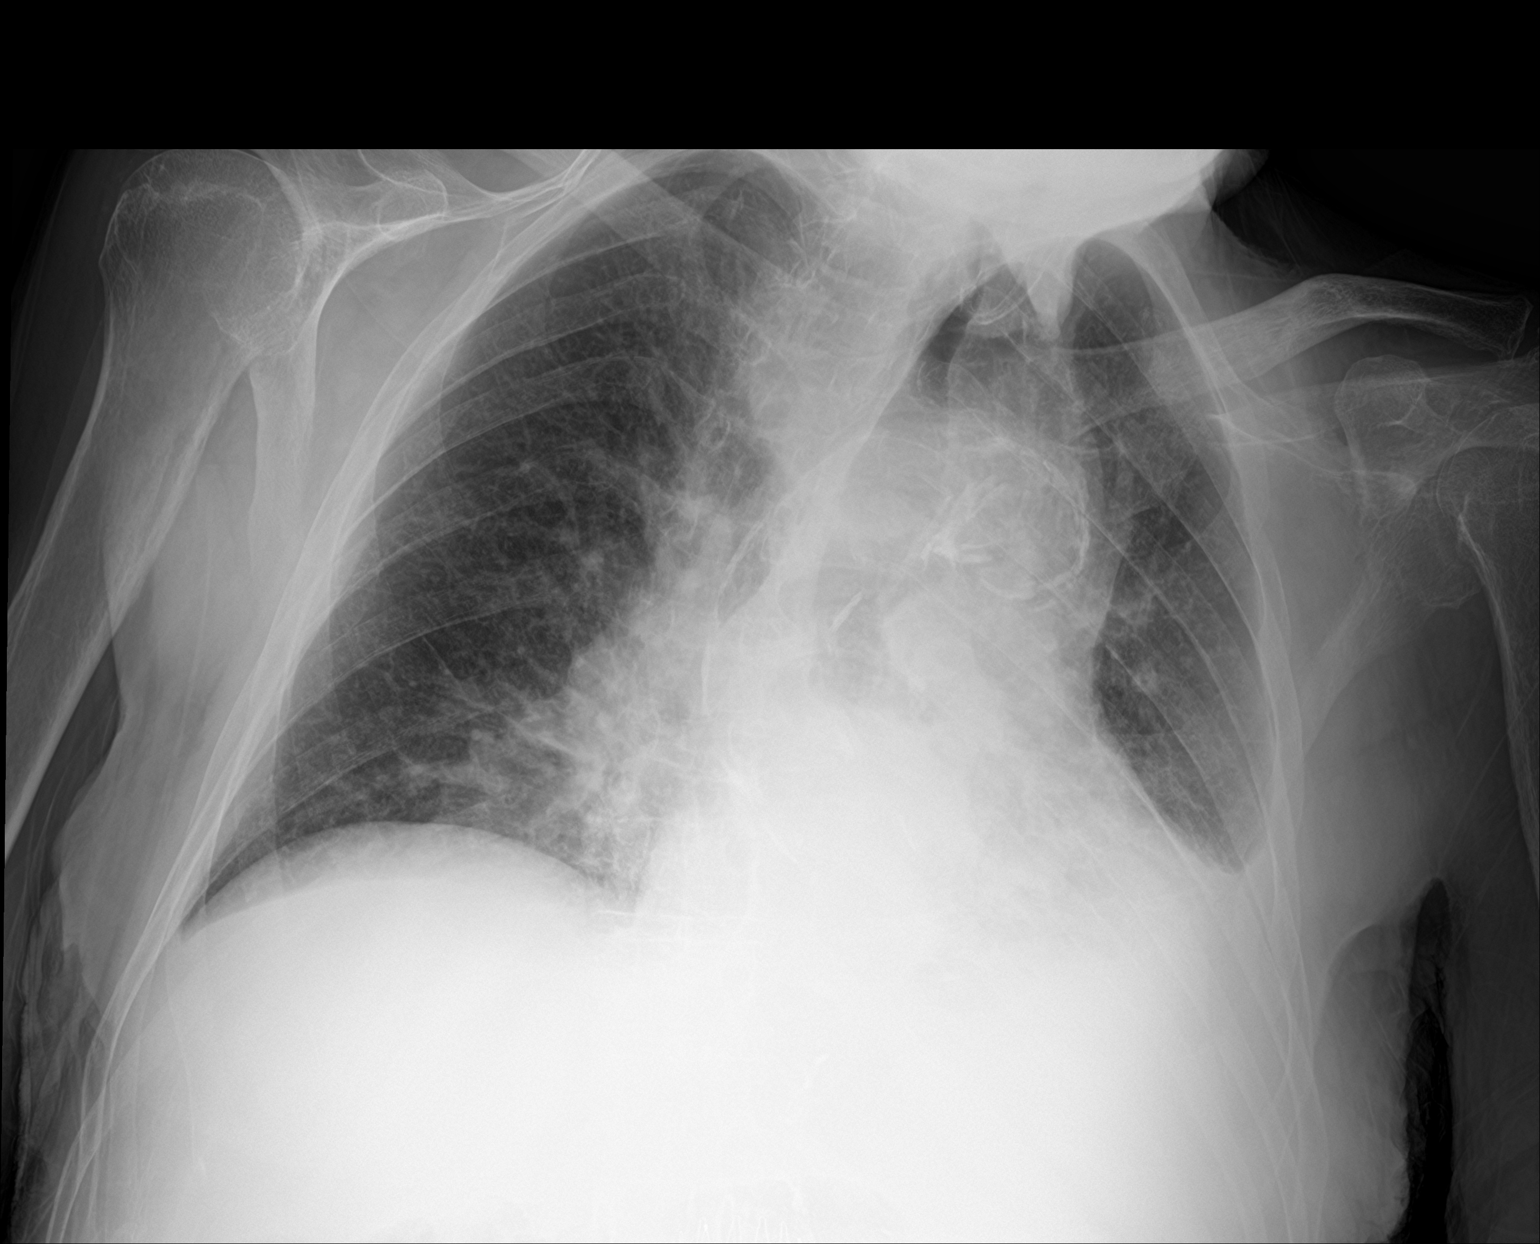

[chest lat]
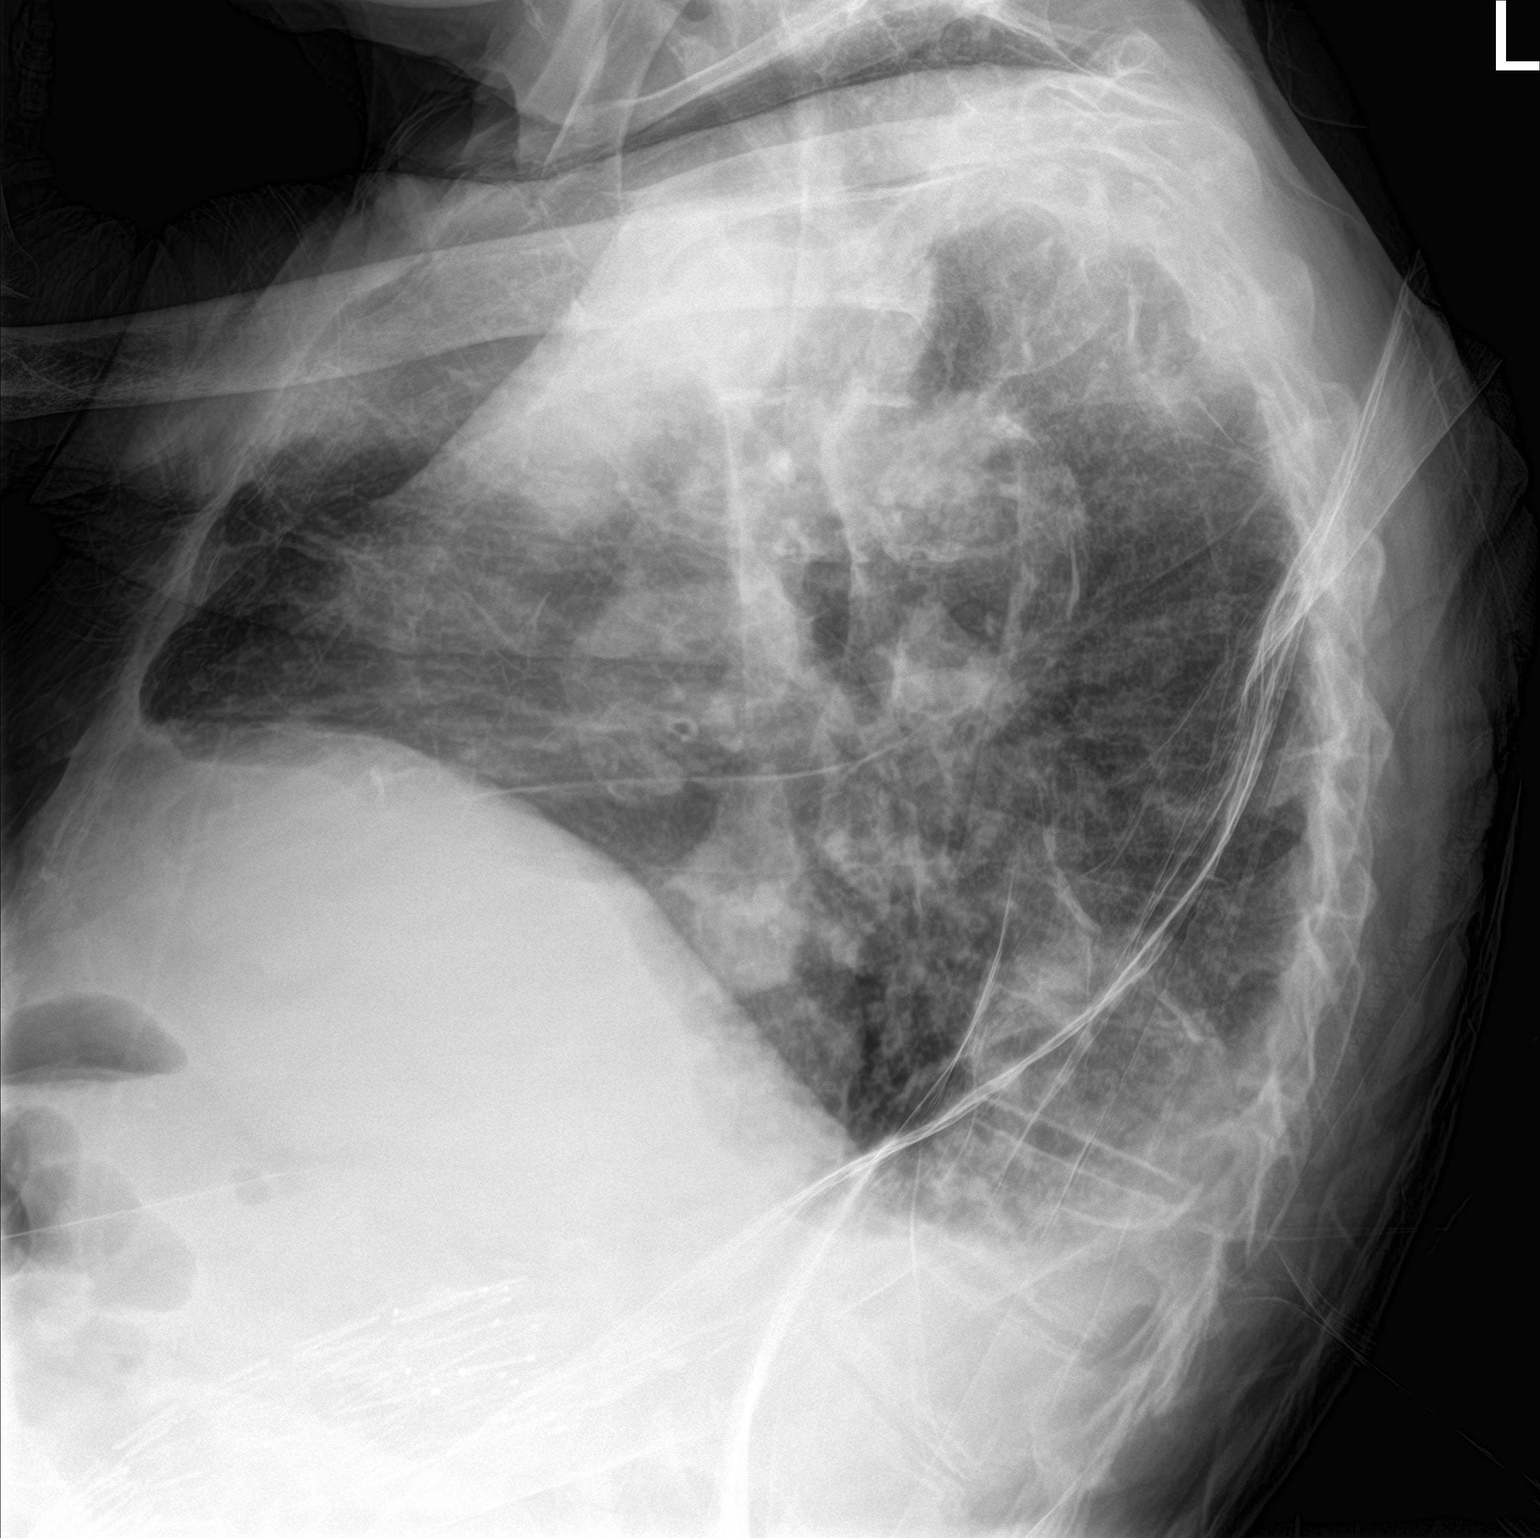

[2 of 2 positions shown; findings below may reference images not displayed]

FINDINGS: There is airspace consolidation in the left lower lobe with small
left pleural effusion. There is mild atelectasis in the right base.
Right lung is otherwise clear. Heart is upper normal in size with
pulmonary vascularity normal. No adenopathy. There is aortic
atherosclerosis. No evident bone lesions.
IMPRESSION: Airspace consolidation left lower lobe posteriorly with small left
pleural effusion. Suspect pneumonia left base.

Mild right base atelectasis.

Heart upper normal in size.  There is aortic atherosclerosis.

Aortic Atherosclerosis ([id number]).

## 2018-12-16 IMAGING — DX DG CHEST 2V
2 series · 2 of 2 positions shown · non-contrast
Comparison: 01/30/2018

CLINICAL DATA: Pneumonia for 2 weeks.

EXAM:
CHEST  2 VIEW

[chest pa]
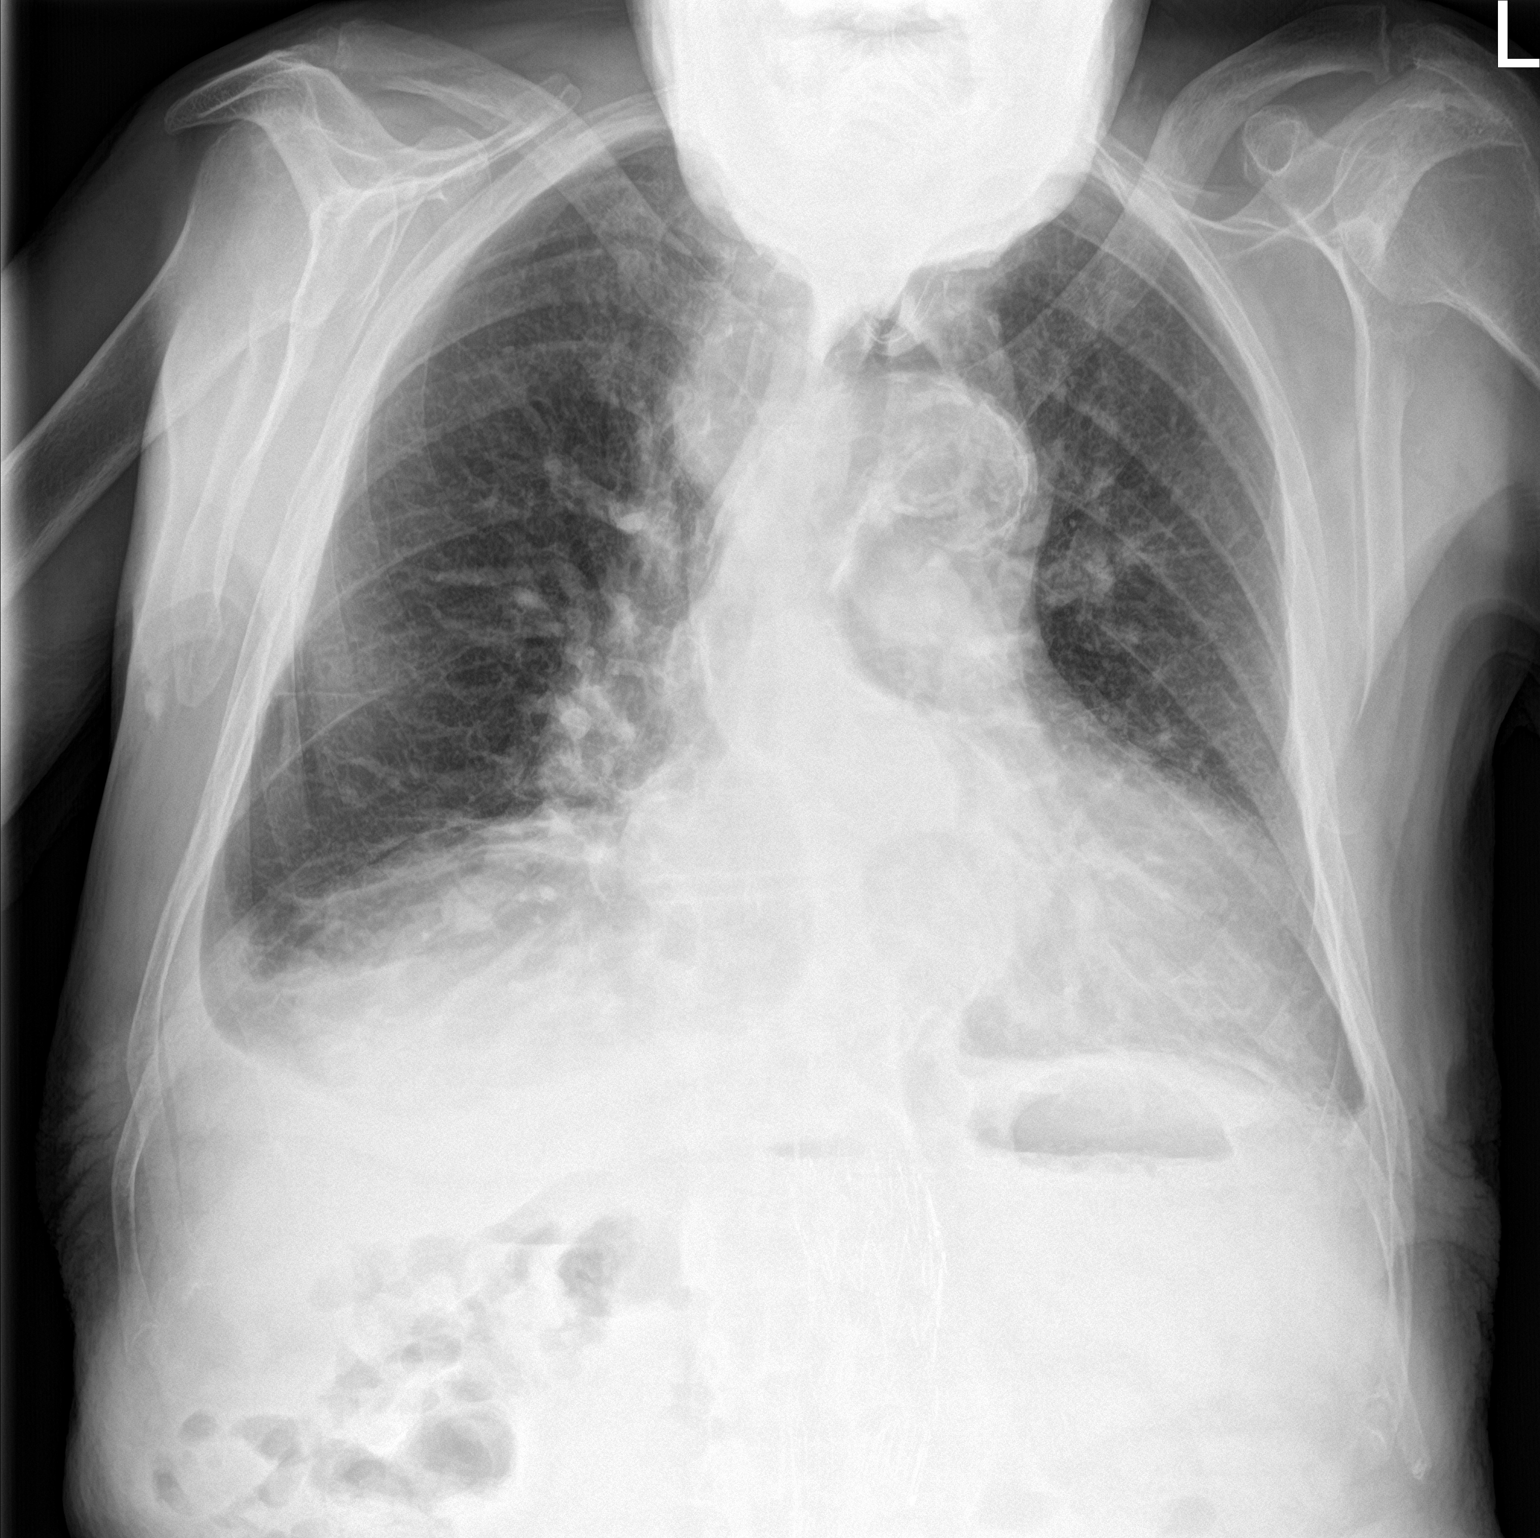

[chest lat]
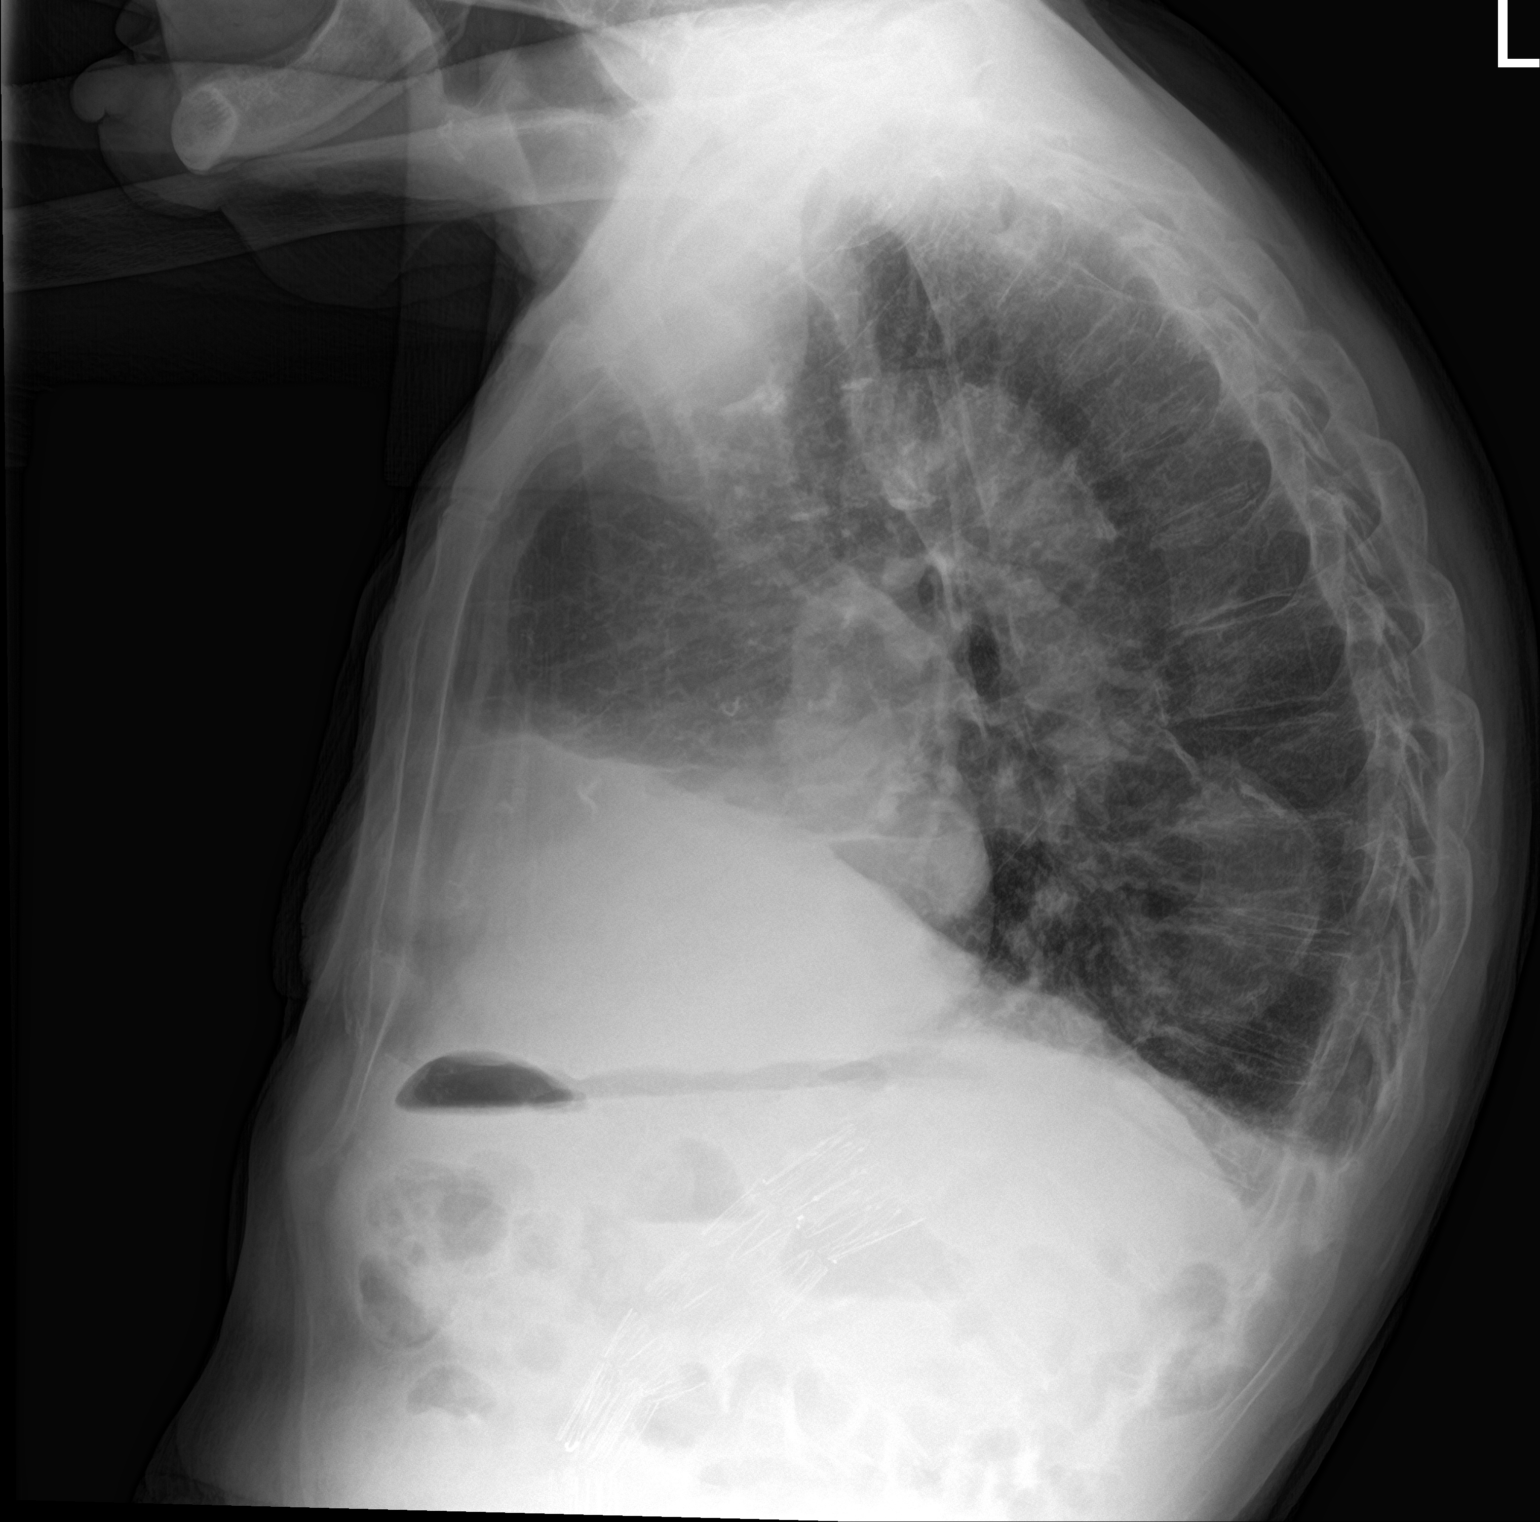

[2 of 2 positions shown; findings below may reference images not displayed]

FINDINGS: Small bilateral pleural effusions. Bilateral diffuse interstitial
thickening. Right basilar airspace disease likely reflecting
atelectasis. Stable cardiomegaly. Thoracic aortic atherosclerosis.
No acute osseous abnormality.
IMPRESSION: Cardiomegaly with mild pulmonary vascular congestion. Hazy right
lower lobe airspace disease likely reflecting atelectasis.
